# Patient Record
Sex: Female | Born: 1945 | Race: White | Hispanic: No | Marital: Married | Smoking: Former smoker
Health system: Southern US, Community
[De-identification: ages and names within clinical notes are randomized; demographics above are authoritative.]

## PROBLEM LIST (undated history)

## (undated) DIAGNOSIS — E785 Hyperlipidemia, unspecified: Secondary | ICD-10-CM

## (undated) DIAGNOSIS — G8929 Other chronic pain: Secondary | ICD-10-CM

## (undated) DIAGNOSIS — K7460 Unspecified cirrhosis of liver: Secondary | ICD-10-CM

## (undated) DIAGNOSIS — J449 Chronic obstructive pulmonary disease, unspecified: Secondary | ICD-10-CM

## (undated) DIAGNOSIS — K219 Gastro-esophageal reflux disease without esophagitis: Secondary | ICD-10-CM

## (undated) DIAGNOSIS — M1990 Unspecified osteoarthritis, unspecified site: Secondary | ICD-10-CM

## (undated) DIAGNOSIS — H269 Unspecified cataract: Secondary | ICD-10-CM

## (undated) DIAGNOSIS — I10 Essential (primary) hypertension: Secondary | ICD-10-CM

## (undated) DIAGNOSIS — M797 Fibromyalgia: Secondary | ICD-10-CM

## (undated) DIAGNOSIS — D126 Benign neoplasm of colon, unspecified: Secondary | ICD-10-CM

## (undated) DIAGNOSIS — H9190 Unspecified hearing loss, unspecified ear: Secondary | ICD-10-CM

## (undated) DIAGNOSIS — F419 Anxiety disorder, unspecified: Secondary | ICD-10-CM

## (undated) DIAGNOSIS — T7840XA Allergy, unspecified, initial encounter: Secondary | ICD-10-CM

## (undated) DIAGNOSIS — Z1379 Encounter for other screening for genetic and chromosomal anomalies: Secondary | ICD-10-CM

## (undated) DIAGNOSIS — G709 Myoneural disorder, unspecified: Secondary | ICD-10-CM

## (undated) DIAGNOSIS — Z87898 Personal history of other specified conditions: Secondary | ICD-10-CM

## (undated) DIAGNOSIS — E119 Type 2 diabetes mellitus without complications: Secondary | ICD-10-CM

## (undated) DIAGNOSIS — I499 Cardiac arrhythmia, unspecified: Secondary | ICD-10-CM

## (undated) DIAGNOSIS — G4730 Sleep apnea, unspecified: Secondary | ICD-10-CM

## (undated) DIAGNOSIS — M549 Dorsalgia, unspecified: Secondary | ICD-10-CM

## (undated) HISTORY — DX: Chronic obstructive pulmonary disease, unspecified: J44.9

## (undated) HISTORY — DX: Sleep apnea, unspecified: G47.30

## (undated) HISTORY — DX: Other chronic pain: G89.29

## (undated) HISTORY — PX: LARYNX SURGERY: SHX692

## (undated) HISTORY — DX: Unspecified osteoarthritis, unspecified site: M19.90

## (undated) HISTORY — DX: Essential (primary) hypertension: I10

## (undated) HISTORY — DX: Personal history of other specified conditions: Z87.898

## (undated) HISTORY — DX: Hyperlipidemia, unspecified: E78.5

## (undated) HISTORY — DX: Unspecified cirrhosis of liver: K74.60

## (undated) HISTORY — DX: Allergy, unspecified, initial encounter: T78.40XA

## (undated) HISTORY — PX: COLONOSCOPY: SHX174

## (undated) HISTORY — PX: UPPER GASTROINTESTINAL ENDOSCOPY: SHX188

## (undated) HISTORY — DX: Anxiety disorder, unspecified: F41.9

## (undated) HISTORY — DX: Type 2 diabetes mellitus without complications: E11.9

## (undated) HISTORY — PX: TUMOR REMOVAL: SHX12

## (undated) HISTORY — DX: Gastro-esophageal reflux disease without esophagitis: K21.9

## (undated) HISTORY — DX: Unspecified cataract: H26.9

## (undated) HISTORY — DX: Unspecified hearing loss, unspecified ear: H91.90

## (undated) HISTORY — DX: Fibromyalgia: M79.7

## (undated) HISTORY — DX: Cardiac arrhythmia, unspecified: I49.9

## (undated) HISTORY — DX: Dorsalgia, unspecified: M54.9

## (undated) HISTORY — DX: Encounter for other screening for genetic and chromosomal anomalies: Z13.79

## (undated) HISTORY — PX: TUBAL LIGATION: SHX77

## (undated) HISTORY — PX: POLYPECTOMY: SHX149

## (undated) HISTORY — PX: CATARACT EXTRACTION, BILATERAL: SHX1313

## (undated) HISTORY — PX: MULTIPLE TOOTH EXTRACTIONS: SHX2053

## (undated) HISTORY — DX: Benign neoplasm of colon, unspecified: D12.6

## (undated) HISTORY — DX: Myoneural disorder, unspecified: G70.9

---
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
## 1988-08-15 HISTORY — PX: CHOLECYSTECTOMY: SHX55

## 1999-04-14 ENCOUNTER — Other Ambulatory Visit: Admission: RE | Admit: 1999-04-14 | Discharge: 1999-04-14 | Payer: Self-pay | Admitting: Family Medicine

## 2000-04-18 ENCOUNTER — Other Ambulatory Visit: Admission: RE | Admit: 2000-04-18 | Discharge: 2000-04-18 | Payer: Self-pay | Admitting: Family Medicine

## 2001-06-11 ENCOUNTER — Ambulatory Visit (HOSPITAL_BASED_OUTPATIENT_CLINIC_OR_DEPARTMENT_OTHER): Admission: RE | Admit: 2001-06-11 | Discharge: 2001-06-11 | Payer: Self-pay | Admitting: *Deleted

## 2001-06-11 ENCOUNTER — Encounter (INDEPENDENT_AMBULATORY_CARE_PROVIDER_SITE_OTHER): Payer: Self-pay | Admitting: *Deleted

## 2002-05-30 ENCOUNTER — Other Ambulatory Visit: Admission: RE | Admit: 2002-05-30 | Discharge: 2002-05-30 | Payer: Self-pay | Admitting: Family Medicine

## 2003-08-04 ENCOUNTER — Other Ambulatory Visit: Admission: RE | Admit: 2003-08-04 | Discharge: 2003-08-04 | Payer: Self-pay | Admitting: Family Medicine

## 2004-07-19 ENCOUNTER — Encounter: Admission: RE | Admit: 2004-07-19 | Discharge: 2004-09-09 | Payer: Self-pay | Admitting: Podiatry

## 2005-07-12 ENCOUNTER — Other Ambulatory Visit: Admission: RE | Admit: 2005-07-12 | Discharge: 2005-07-12 | Payer: Self-pay | Admitting: Family Medicine

## 2008-08-11 ENCOUNTER — Ambulatory Visit: Payer: Self-pay | Admitting: Cardiology

## 2008-08-11 LAB — CONVERTED CEMR LAB
GFR calc Af Amer: 131 mL/min
GFR calc non Af Amer: 108 mL/min
Potassium: 4.1 meq/L (ref 3.5–5.1)
Sodium: 143 meq/L (ref 135–145)

## 2008-08-14 ENCOUNTER — Encounter: Payer: Self-pay | Admitting: Cardiology

## 2008-08-14 ENCOUNTER — Ambulatory Visit: Payer: Self-pay

## 2008-08-14 ENCOUNTER — Ambulatory Visit: Payer: Self-pay | Admitting: Cardiology

## 2008-08-25 ENCOUNTER — Ambulatory Visit: Payer: Self-pay | Admitting: Cardiology

## 2008-08-25 ENCOUNTER — Ambulatory Visit: Payer: Self-pay

## 2008-08-28 ENCOUNTER — Ambulatory Visit: Payer: Self-pay

## 2008-09-22 ENCOUNTER — Ambulatory Visit: Payer: Self-pay | Admitting: Cardiology

## 2008-10-30 ENCOUNTER — Inpatient Hospital Stay (HOSPITAL_COMMUNITY): Admission: EM | Admit: 2008-10-30 | Discharge: 2008-10-31 | Payer: Self-pay | Admitting: Emergency Medicine

## 2008-10-30 ENCOUNTER — Ambulatory Visit: Payer: Self-pay | Admitting: Cardiology

## 2010-11-25 LAB — CARDIAC PANEL(CRET KIN+CKTOT+MB+TROPI)
CK, MB: 1.1 ng/mL (ref 0.3–4.0)
Relative Index: INVALID (ref 0.0–2.5)
Total CK: 60 U/L (ref 7–177)
Total CK: 69 U/L (ref 7–177)
Troponin I: <0.01 ng/mL (ref 0.00–0.06)

## 2010-11-25 LAB — TSH: TSH: 4.004 u[IU]/mL (ref 0.350–4.500)

## 2010-11-25 LAB — CK TOTAL AND CKMB (NOT AT ARMC)
CK, MB: 1.4 ng/mL (ref 0.3–4.0)
Total CK: 71 U/L (ref 7–177)

## 2010-11-25 LAB — BASIC METABOLIC PANEL
BUN: 11 mg/dL (ref 6–23)
Calcium: 8.6 mg/dL (ref 8.4–10.5)
Chloride: 107 mEq/L (ref 96–112)
GFR calc Af Amer: >60 mL/min (ref >60)
GFR calc Af Amer: >60 mL/min (ref >60)
GFR calc non Af Amer: >60 mL/min (ref >60)
Glucose, Bld: 114 mg/dL — ABNORMAL HIGH (ref 70–99)
Potassium: 3.9 mEq/L (ref 3.5–5.1)
Sodium: 139 mEq/L (ref 135–145)
Sodium: 140 mEq/L (ref 135–145)

## 2010-11-25 LAB — CBC
Hemoglobin: 13.5 g/dL (ref 12.0–15.0)
Hemoglobin: 14.6 g/dL (ref 12.0–15.0)
MCHC: 35.1 g/dL (ref 30.0–36.0)
RBC: 4.18 MIL/uL (ref 3.87–5.11)
RBC: 4.5 MIL/uL (ref 3.87–5.11)
RDW: 13.2 % (ref 11.5–15.5)
WBC: 6.2 10*3/uL (ref 4.0–10.5)

## 2010-11-25 LAB — GLUCOSE, CAPILLARY: Glucose-Capillary: 103 mg/dL — ABNORMAL HIGH (ref 70–99)

## 2010-11-25 LAB — TROPONIN I: Troponin I: <0.01 ng/mL (ref 0.00–0.06)

## 2010-12-28 NOTE — Assessment & Plan Note (Signed)
[facility]                            CARDIOLOGY OFFICE NOTE   Stacey Summers, Stacey Summers                    MRN:          [id number]  DATE:08/11/2008                            DOB:          06-May-1946    PRIMARY CARE PHYSICIAN:  Chipper Herb, MD   REASON FOR PRESENTATION:  Evaluate the patient with palpitations,  shortness of breath, and multiple cardiovascular risk factors.   HISTORY OF PRESENT ILLNESS:  The patient is a 65 year old white female  with a history of, per her report, cardiomegaly that was made years ago.  She said she had an echocardiogram some years ago, but does not recall  any other testing other than perhaps a monitor.  She is referred for  evaluation of predominantly shortness of breath and some discomfort in  her back and chest.  The shortness of breath happens when she walks  incline up to her front door or walks up a flight of stairs.  She does  not describe resting shortness of breath and does not have any PND or  orthopnea.  This has been slowly progressive.  She does have discomfort.  This has been sporadic for quite some time.  It starts in her back.  She  says it is not sharp.  She cannot describe it otherwise.  It is slow and  moderate in intensity.  It radiates through to the front.  It happens at  rest.  It may last for about 30 minutes before going away spontaneously.  If she is doing something, she has to sit and wait for it to go away.  She does not describe discomfort in her jaw or her arms.  She does have  hot flashes not necessarily with this discomfort.  She has diaphoresis,  not necessarily with this discomfort.  She has a low-grade nausea at  times with this discomfort and at other times isolated.  She has never  had any presyncope or syncope.  She will occasionally feel palpitations.  Because of all of these complaints as well as her past history and  family history, she presents for evaluation.   PAST  MEDICAL HISTORY:  Hypertension x30 years, hyperlipidemia x4 years,  questionable cardiomegaly.   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   ALLERGIES AND INTOLERANCES:  None.   MEDICATIONS:  Furosemide 20 mg daily, Symbicort, tramadol 50 mg p.r.n.,  amlodipine, benazepril 5/20 daily, simvastatin 40 mg daily.   SOCIAL HISTORY:  The patient is unemployed.  She is married.  She has 2  children.  She smokes cigarettes only briefly.  She does not drink  alcohol.   FAMILY HISTORY:  Remarkable for her mother apparently dying of  cardiomegaly and congestive heart failure.  She died suddenly at age 39.  She does have a brother with carotid stenosis and a CVA at age 18.   REVIEW OF SYSTEMS:  As stated in the HPI, positive for reflux, hot  flashes, diaphoresis, cough, wheezing, joint pains.  Negative for all  other systems.   PHYSICAL EXAMINATION:  GENERAL:  The patient is in  no distress.  She is  pleasant, but appears a little anxious.  VITAL SIGNS:  Blood pressure 142/84, heart rate 71 and regular, body  mass index 39, weight 210 pounds.  HEENT:  Eyelids are unremarkable, pupils equal, round, and reactive to  light, fundi within normal limits, oral mucosa unremarkable, upper  dentures.  NECK:  No jugular venous distention at 45 degrees.  Carotid upstrokes  brisk and symmetric.  No bruits.  No thyromegaly.  LYMPHATICS:  No cervical, axillary, or inguinal adenopathy.  LUNGS:  Clear to auscultation bilaterally.  BACK:  No costovertebral angle tenderness.  CHEST:  Unremarkable.  HEART:  PMI not displaced or sustained, S1 and S2 within normal limits,  no S3, no S4, no clicks, no rubs, 2/6 apical systolic murmur radiating  slightly at the aortic outlet tracks and into the carotids, no diastolic  murmurs.  ABDOMEN:  Obese, positive bowel sounds, normal in frequency and pitch,  no bruise, no rebound, guarding in the midline, pulsatile mass, no  hepatomegaly,  splenomegaly.  SKIN:  No rashes, no nodules.  EXTREMITIES:  2+ pulses throughout, no edema, no cyanosis, no clubbing.  NEURO:  Oriented to person, place, and time.  Cranial nerves II-XII  grossly intact, motor grossly intact.   EKG:  Sinus rhythm with premature ectopic complex with probably  premature ventricular contractions, no acute ST-T wave changes.   ASSESSMENT AND PLAN:  1. Dyspnea.  The patient has had dyspnea and a history of      cardiomegaly.  She has frequent ventricular ectopy.  I am going to      start with an echocardiogram to evaluate this.  2. Chest discomfort.  I will perform an exercise treadmill test.  I      think the pretext probability of obstructive coronary disease is      somewhat low.  However, obstructive coronary disease needs to be      excluded.  3. Premature ventricular contraction.  She is clearly having a lot of      ectopy.  I will check to see if she has any chemistries recently or      any thyroid studies.  We will also check the echocardiogram as      above.  She will wear a 24-hour Holter monitor.  4. Obesity.  We will discuss weight loss with diet and exercise.  5. Hypertension.  Her blood pressure is slightly elevated.  We will      see what her response on the treadmill test is, and we will make      adjustment as needed.  6. Dyslipidemia per Dr. Laurance Flatten.  7. Followup will be at the time of her echo and stress test.     Minus Breeding, MD, [facility]  Electronically Signed    JH/MedQ  DD: 08/11/2008  DT: 08/12/2008  Job #: [id number]   cc:   Chipper Herb, M.D.

## 2010-12-28 NOTE — Cardiovascular Report (Signed)
NAMEJERIKA, Stacey Summers             ACCOUNT NO.:  [id number]   MEDICAL RECORD NO.:  [id number]           PATIENT TYPE:   LOCATION:                                 FACILITY:   PHYSICIAN:  Shaune Pascal. Bensimhon, MDDATE OF BIRTH:  [birth year]   DATE OF PROCEDURE:  10/31/2008  DATE OF DISCHARGE:                            CARDIAC CATHETERIZATION   PATIENT IDENTIFICATION:  Stacey Summers is a 65 year old woman with a  history of hypertension, hypertension, hyperlipidemia, diabetes,  obesity, and a strong family history of sudden cardiac death.  She has  also a long history of palpitations and previous monitor showed PVCs and  a very brief nonsustained VT.  She also had a previous Myoview which  showed an ejection fraction of 61%.  There was a question of a small  area of ischemia in the mid anterior wall.  This has been treated  medically.  She was admitted with chest pain.  Cardiac markers and EKG  were normal.  Given her risk factors and mildly positive stress test,  she was brought for cardiac catheterization.   PROCEDURES PERFORMED:  1. Selective coronary angiography.  2. Left heart cath.  3. Left ventriculogram.  4. StarClose femoral artery closure.   DESCRIPTION OF PROCEDURE:  The risks and indication were explained.  Consent was signed and placed on the chart.  A 5-French arterial sheath  was placed in the right femoral artery using a modified Seldinger  technique.  Standard catheters including a JL-4, JR-4, and angled  pigtail were used for the procedure.  All catheter exchanges were made  over a wire.  There were no apparent complications.  At the end the  procedure, the right femoral arteriotomy site was closed with a  StarClose device.  There was good hemostasis.   Central aortic pressure 156/74 with a mean of 105.  LV pressure 146/90  with an EDP of 17.  No aortic stenosis.   Left main was short, angiographically normal.   LAD was a long vessel wrapping the apex.  It gave  off 2 diagonals.  LAD  itself was free of significant disease.  In the ostium of the moderate-  sized first diagonal, there was approximately 60% tubular  lesion.  Following this, there was about a 50% lesion in the proximal portion.   Left circumflex was a large codominant vessel.  It gave off a ramus  branch, small OM-1, a posterolateral, and a PDA.  It was  angiographically normal.   Right coronary artery was a small codominant vessel.  It gave off a PDA  coming off the acute marginal branch.  The distal PL was small.  It was  angiographically normal.   Left ventriculogram done in the RAO position showed an EF of 70%.  No  regional wall motion abnormalities.   ASSESSMENT:  1. 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  2. Normal left ventricular function.  3. Frequent premature atrial contractions/premature ventricular      contractions.   PLAN/DISCUSSION:  I suspect her symptoms are more related to her ectopy  rather than coronary artery disease.  We will continue medical therapy.  She will go home tonight.  If symptoms continue, consider outpatient  event monitor.      Shaune Pascal. Bensimhon, MD  Electronically Signed     DRB/MEDQ  D:  10/31/2008  T:  11/01/2008  Job:  [id number]

## 2010-12-28 NOTE — H&P (Signed)
Stacey Summers, Stacey Summers             ACCOUNT NO.:  [id number]   MEDICAL RECORD NO.:  [id number]          PATIENT TYPE:  INP   LOCATION:  3731                         FACILITY:  [facility]   PHYSICIAN:  Denice Bors. Stanford Breed, MD, FACCDATE OF BIRTH:  [birth year]   DATE OF ADMISSION:  10/30/2008  DATE OF DISCHARGE:                              HISTORY & PHYSICAL   Stacey Summers is a 65 year old female with a past medical history of  diet-controlled diabetes mellitus, hypertension, hyperlipidemia, PVCs,  brief nonsustained ventricular tachycardia, who presents with chest pain  and diaphoresis.  The patient has recently been seen by Dr. Percival Spanish  secondary to palpitations and dyspnea.  He initially saw her on August 11, 2008.  At that time, he ordered an echocardiogram.  This was  performed on August 14, 2008, and showed normal LV function and a  trivial aortic insufficiency.  He subsequently scheduled her to have an  exercise treadmill which was performed on August 25, 2008.  She only  exercised for 3 minutes and 21 seconds on the Bruce protocol which was 5  METS.  She achieved a heart rate of 111.  There was fatigue and dyspnea,  but there was no chest pain or electrocardiographic changes.  It was  interpreted as a negative inadequate stress test.  Because she did not  achieve target heart rate, he subsequent ordered an adenosine Myoview  which showed an ejection fraction of 61%.  There was question small area  of ischemia in the mid anterior wall.  He has been treating this  medically.  Note, she did have a Holter monitor as well that showed PVCs  11% of the time with 1 three-beat run of nonsustained ventricular  tachycardia.  There were also occasional PACs and brief runs of PAT.  She was placed on a beta-blocker.  Note, she does have chronic dyspnea  on exertion, but there was no orthopnea or PND.  She occasionally has  mild pedal edema.  She has never had a syncopal episode.  She does not  have exertional chest pain.  Today, she was sitting at her computer  working and suddenly developed a diaphoresis, nausea, and mild  dizziness.  There was no presyncope or syncope.  She had mild chest  tightness.  The tightness did not radiate.  It was not pleuritic or  positional nor was it related to food.  The episode lasted for 15-20  minutes.  It resolved spontaneously.  She subsequently was seen at  [facility] and they referred her to the  emergency room.  She now feels well other than mildly anxious about what  happened earlier today.   Her medications include:  1. Lasix 20 mg p.o. daily.  2. Symbicort.  3. Zocor 40 mg p.o. daily.  4. Lopressor 25 mg p.o. b.i.d.  5. Tramadol.  6. Potassium.  7. Aspirin 81 mg p.o. daily.   She has no known drug allergies.   SOCIAL HISTORY:  She lives in [place], [place].  She is  married and has 3 children.  The patient presently does not smoke.  She  does not consume alcohol.   Her family history is significant for her mother who died suddenly at  age 80.  She states that the autopsy report stated she had an enlarged  heart and also a fatty liver.  She also had a grandmother who died  suddenly in her 63s.  There was also a brother of her grandmother who  died suddenly.   Her past medical history is significant for diet-controlled diabetes  mellitus.  She also has hypertension and hyperlipidemia.  She has had a  previous fatty tumor resected from her abdominal wall.  She also has a  history of asthma.  She has chronic low back pain and degenerative joint  disease.   REVIEW OF SYSTEMS:  There are no headaches or fevers or chills.  She has  not had productive cough or hemoptysis.  There is no dysphagia,  odynophagia, melena, or hematochezia.  There is no dysuria or hematuria.  There is no rash or seizure active.  There is no orthopnea or PND, but  there is pedal edema.  The remaining systems are  negative.   Her physical exam today shows a blood pressure of 132/54 and her pulse  is 72.  Her temperature is 97.7.  She is well developed and somewhat  obese.  She is in no acute distress at present.  Her skin is warm and  dry.  She does not appear to be depressed.  There is no peripheral  clubbing.  Her back is normal.  Her HEENT is normal with normal eyelids.  Her neck is supple with a normal upstroke bilaterally.  No bruits noted.  There is no jugular venous distention, and I cannot appreciate  thyromegaly.  Her chest is clear to auscultation with normal expansion.  Her cardiovascular exam reveals a regular rate and rhythm.  Normal S1  and S2.  There are no murmurs, rubs, or gallops noted.  Abdominal exam  is nontender, nondistended.  Positive bowel sounds.  No  hepatosplenomegaly and no masses appreciated.  There is no abdominal  bruit.  She has 2+ femoral pulses bilaterally.  No bruits.  Extremities  show no edema, and I can palpate no cords.  She has 2+ dorsalis pedis  pulses bilaterally.  Neurologic exam is grossly intact.   Her electrocardiogram shows a normal sinus rhythm with occasional PVC.  There are no ST changes noted.  Her QT is not prolonged.  Chest x-ray  shows no active disease.  Her initial markers are negative.  Her  potassium is 4.5.  Her hemoglobin and hematocrit are 14.6 and 41.5.  Platelet count is 115.   DIAGNOSES:  1. Chest tightness/diaphoresis - the etiology of these events is      unclear to me.  It does not sound like ischemia, but we will cycle      enzymes to exclude myocardial infarction.  We will also watch her      on telemetry to exclude significant arrhythmia.  I will check a D-      dimer as she has traveled to [place] recently.  I think pulmonary      embolus is unlikely, but needs to be excluded.  We will continue      with her aspirin and statin, and I will increase her Lopressor to      50 mg p.o. b.i.d.  I will review with Dr. Percival Spanish.   She does have      a strong family history  of sudden death.  She has a recent question      mild abnormality on her Myoview, and I wonder whether      catheterization may be needed for definitive evaluation.  Again, we      will review this decision with Dr. Percival Spanish.  2. Hypertension - her blood pressure is controlled.  I am increasing      her Lopressor for ectopy.  3. Diabetes mellitus.  4. Hyperlipidemia - she will continue on her statin.      Denice Bors Stanford Breed, MD, [facility]  Electronically Signed     BSC/MEDQ  D:  10/30/2008  T:  10/31/2008  Job:  [id number]

## 2010-12-28 NOTE — Procedures (Signed)
[facility]                              EXERCISE TREADMILL   NAME:[name], Stacey Summers                    MRN:          [id number]  DATE:08/25/2008                            DOB:          17-May-1946    PROCEDURE:  Exercise treadmill test.   INDICATION:  Evaluate the patient with chest pain and cardiovascular  risk factors.   PROCEDURE NOTE:  The patient was exercised using standard Bruce  protocol.  She was only able to exercise for 3 minutes and 21 seconds.  This was 21 seconds into stage II.  She achieved only 5.0 METS.  She had  a peak heart rate of only 111, which was 69% of predicted.  The test was  terminated because of fatigue and dyspnea.  She did not have chest  discomfort.  She complained of arm discomfort with a blood pressure cuff  was squeezing her arm.  She was generally uncomfortable with some  procedure.  There were no ischemic ST-T wave changes.  She had ectopy at  baseline, which improved slightly with activity.  We only had one blood  pressure reading and it did go up to 158/74 peak in stage I.   CONCLUSION:  Negative inadequate stress perfusion study.  The patient  had very poor exercise tolerance.  Of note, I did review her echo with  her.  It demonstrates no significant abnormalities.  She did have a  Holter, which demonstrates premature ventricular contractions about 11%  of the time.  There was one run of three beats of nonsustained  ventricular tachycardia.  There were some premature atrial contractions  and brief runs of atrial tachyarrhythmia as well.   PLAN:  The patient will need to be referred for an adenosine perfusion  study given her poor exercise tolerance.  I still need to exclude  ischemia particular in the light of the ventricular ectopy.  I will see  her back in the clinic after this study is available.     Minus Breeding, MD, [facility]  Electronically Signed    JH/MedQ  DD: 08/25/2008  DT: 08/26/2008  Job #:  [id number]   cc:   Chipper Herb, M.D.

## 2010-12-28 NOTE — Assessment & Plan Note (Signed)
Stacey Summers, Stacey                    MRN:          [id number]  DATE:09/22/2008                            DOB:          February 26, 1946    PRIMARY CARE PHYSICIAN:  Chipper Herb, MD   REASON FOR PRESENTATION:  Evaluate the patient with palpitations,  shortness of breath, and premature ventricular contractions.   HISTORY OF PRESENT ILLNESS:  The patient presented for evaluation of the  above.  I saw her in late December.  Because of these complaints and  cardiovascular risk factors, I had her come back for an exercise  treadmill test.  However, she had very poor exercise tolerance and this  was negative inadequate study.  Therefore, she came back for an  adenosine Cardiolite.  This demonstrated an ejection fraction of 61%.  There was a questionable small area of ischemia in the mid anterior  wall.   The patient now returns for followup.  She states she is not getting any  chest pressure, neck or arm discomfort.  She is having palpitations as  described previously.  These seemed to be about the same pattern.  They  are sporadic.  She notices this is more at night.  She is not having any  presyncope or syncope with this.  She has not had any shortness of  breath or chest pain with this.  They come and go spontaneously.  She  has been under stress.  She was caring for an ailing aunt and her aunt  just died.   PAST MEDICAL HISTORY:  1. 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  2. Short runs of atrial tachycardia.  3. Hypertension x30 years.  4. Hyperlipidemia x4 years.  5. Fatty tumor resected from the abdominal wall.   ALLERGIES/INTOLERANCES:  None.   MEDICATIONS:  1. Furosemide 20 mg daily.  2. Symbicort b.i.d.  3. Amlodipine.  4. Benicar 5/20 daily.  5. Simvastatin 40 mg daily.   REVIEW OF SYSTEMS:  As stated in the HPI and otherwise, negative for  other  systems.   PHYSICAL EXAMINATION:  GENERAL:  The patient is pleasant and in no  distress.  VITAL SIGNS:  Blood pressure 146/86, heart rate 92 and regular, and  weight 213 pounds.  HEENT:  Eyes are unremarkable; pupils equal, round, and reactive to  light; fundi not visualized, oral mucosa unremarkable.  NECK:  No jugular venous distention at 45 degrees; carotid upstroke  brisk and symmetric; no bruits, no thyromegaly.  LYMPHATICS:  No cervical, axillary, or inguinal adenopathy.  LUNGS:  Clear to auscultation bilaterally.  BACK:  No costovertebral angle tenderness.  CHEST:  Unremarkable.  HEART:  PMI not displaced or sustained; S1 and S2 within normal limits;  no S3, no S4; no clicks, no rubs, no murmurs.  ABDOMEN:  Obese; positive bowel sounds; normal in frequency and pitch;  no bruits, no rebound, no guarding; no midline pulsatile mass; no  hepatomegaly, splenomegaly.  SKIN:  No rashes.  No nodules.  EXTREMITIES:  Pulses 2+ throughout; no edema, no cyanosis, no clubbing.  NEUROLOGIC:  Oriented to person, place, and time; cranial nerves II  through XII grossly intact, motor grossly intact.   ASSESSMENT AND PLAN:  1. Palpitations.  The patient does have the ectopy as described.  She      is still feeling these palpitations.  I do not get any strong      evidence of high-grade vascular disease in large vessels.  She has      a well-preserved ejection fraction.  I do not think further workup      is suggested, but would treat this symptomatically.  Therefore, I      am going to start her on metoprolol 25 mg b.i.d.  She will let me      know if these palpitations get worse or not.  2. Abnormal cardiovascular study.  The patient had the mild ischemia      questionably.  However, this is only a mild finding.  I think this      could be managed medically.  No further cardiovascular testing is      suggested.  She should continue with aggressive risk reduction.  3. Hypertension.  Blood  pressure is slightly elevated.  We will treat      this in the context of managing her palpitations with the addition      of the beta-blocker.  4. Dyslipidemia per Dr. Laurance Flatten, with goal LDL less than 100 and HDL      greater than 50.  5. Followup.  I will see the patient again as needed.     Minus Breeding, MD, [facility]  Electronically Signed    JH/MedQ  DD: 09/22/2008  DT: 09/23/2008  Job #: [id number]   cc:   Chipper Herb, M.D.

## 2010-12-28 NOTE — Discharge Summary (Signed)
NAMEJAMESETTA, [facility] Summers             ACCOUNT NO.:  [id number]   MEDICAL RECORD NO.:  [id number]          PATIENT TYPE:  INP   LOCATION:  3731                         FACILITY[facility]  [facility]   PHYSICIAN:  Shaune Pascal. Bensimhon, MDDATE OF BIRTH:  October 04, 1945   DATE OF ADMISSION:  10/30/2008  DATE OF DISCHARGE:  10/31/2008                               DISCHARGE SUMMARY   PRIMARY CARDIOLOGIST:  Stacey Breeding, MD, [facility].   PRIMARY CARE [facility]:  [facility], M.D.   DISCHARGE DIAGNOSIS:  Chest pain.   SECONDARY DIAGNOSES:  1. Frequent premature atrial and ventricular contractions.  2. Nonobstructive coronary artery disease by cardiac catheterization      this admission.  3. History of nonsustained ventricular tachycardia noted on previous      Holter monitor.  4. History of atrial tachycardia.  5. Hypertension x30 years.  6. Hyperlipidemia x4 years.  7. 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  8. Intermittent asthma.  9. Chronic low back pain/degenerative joint disease.   ALLERGIES:  No known drug allergies.   PROCEDURES:  Left heart cardiac catheterization revealing nonobstructive  coronary artery disease with normal left ventricular function.   HISTORY OF PRESENT ILLNESS:  This 65 year old female with the above  problem lists.  She was recently seen by [facility] with complaints of  palpations and dyspnea.  An echocardiogram was performed and showed  normal LV function.  She subsequently underwent adenosine Myoview, x-ray  negative, inadequate treadmill study and the Myoview showed an EF of 61%  with question of ischemia in the mid anterior wall.  Initially, plan was  for medical therapy; however, the patient presented to the [facility]  on October 30, 2008, with complaints of diaphoresis, nausea, and mild  dizziness, as well as mild chest tightness that occurred while she was  sitting on her computer.  Symptoms lasted 15-20 minutes, then resolved  spontaneously.   In the ED, ECG showed no acute changes and enzymes were  negative.  She was admitted for further evaluation.   [facility] COURSE:  The patient is ruled out for MI by cardiac markers.  Decision was made to proceed with left heart cardiac catheterization  which took place this afternoon revealing nonobstructive coronary artery  disease with a normal LV function.   She has had frequent PACs and PVCs noted on telemetry throughout her  admission.  In light of her reasonably normal catheterization, it is  thought that perhaps, her chest discomfort may be related to her ectopy.  We have titrated her metoprolol to 50 mg b.i.d.  [name] has been  ambulating post catheterization without recurrent symptoms and  limitation.  She will be discharged home this evening in good condition.   DISCHARGE LABORATORY DATA:  Potassium 3.9, chloride 107, CO2 of 28, BUN  11, creatinine 0.65, glucose 101, CK 60, MB 1.1, troponin I less than  0.01, TSH 4.004.   DISPOSITION:  The patient is being discharged home today in good  condition.   FOLLOWUP PLANS AND APPOINTMENTS:  We have asked her to contact our  office for followup with Dr.  Hochrein in approximately 2 weeks.  She  will follow up with Dr. Redge Gainer as previously scheduled.   DISCHARGE MEDICATIONS:  1. Lasix 20 mg daily.  2. Tramadol 50 mg daily.  3. Klor-Con 20 mEq daily.  4. Amlodipine/benazepril 5/20 mg daily.  5. Metoprolol 50 mg b.i.d.  6. Simvastatin 40 mg nightly.   OUTSTANDING LAB STUDIES:  None.   DURATION OF DISCHARGE/ENCOUNTER:  Thirty five minutes including  physician time.      [facility], ANP      Fouke Bensimhon, MD  Electronically Signed    CB/MEDQ  D:  10/31/2008  T:  11/01/2008  Job:  [id number]   cc:   [facility], M.D.

## 2010-12-31 NOTE — Op Note (Signed)
[place]. [facility]  Patient:    [facility], [facility] Visit Number: [id number] MRN: [id number]          Service Type: DSU Location: [facility] Attending Physician:  Lucianne Muss Dictated by:   Abbott Pao March Rummage, M.D. Proc. Date: 06/11/01 Admit Date:  06/11/2001   CC:         Clois Dupes, M.D.                           Operative Report  CCS# [id number]  PREOPERATIVE DIAGNOSIS:  Lipoma, left anterior lower chest wall.  POSTOPERATIVE DIAGNOSIS:  Lipoma, left anterior lower chest wall.  OPERATION PERFORMED:  Excision of lipoma left anterior lower chest wall.  SURGEON:  Marcello Moores B. March Rummage, M.D.  ANESTHESIA:  General.  Anesthesiologist and CRNA.  DESCRIPTION OF PROCEDURE:  The patient was taken to the operating room and placed on the table in supine position.  After satisfactory general anesthetic with intubation.  The left anterior chest area was prepped and draped in a sterile field.  The patient had an 8 x 18 cm subcutaneous mass consistent with a lipoma.  A transverse incision 8 cm long was made.  The incision was taken down through the skin and subcutaneous tissues.  The patient had numerous fat lobules that really did shell out like a single lipoma.  With dissection, the 8 x 18 cm lipomatous mass was removed.  Bleeders were suture ligated with 3-0 Vicryl or cauterized with Bovie.  The wound was copiously irrigated and hemostasis obtained.  Because of the large dead space, a #8 Blake drain was placed through a separate stab wound inferior to the transverse incision.  It was sutured to the skin using 3-0 nylon.  Subcutaneous tissues closed with interrupted sutures of 3-0 Vicryl and the skin was closed with a running vertical mattress suture of 4-0 nylon.  Vaseline gauze was placed over the suture line and around the drain site.  Dry sterile dressing was applied.  The patient seemed to tolerate the procedure well and was taken to the PACU  in satisfactory condition. Dictated by:   Abbott Pao March Rummage, M.D. Attending Physician:  Lucianne Muss DD:  06/11/01 TD:  06/12/01 Job: [id number]

## 2012-04-09 ENCOUNTER — Encounter: Payer: Self-pay | Admitting: Gastroenterology

## 2012-05-04 ENCOUNTER — Ambulatory Visit (AMBULATORY_SURGERY_CENTER): Payer: Medicare Other | Admitting: *Deleted

## 2012-05-04 VITALS — Ht 62.0 in | Wt 233.0 lb

## 2012-05-04 DIAGNOSIS — Z1211 Encounter for screening for malignant neoplasm of colon: Secondary | ICD-10-CM

## 2012-05-04 MED ORDER — MOVIPREP 100 G PO SOLR: ORAL | Status: DC

## 2012-05-09 ENCOUNTER — Ambulatory Visit (AMBULATORY_SURGERY_CENTER): Payer: Medicare Other | Admitting: Gastroenterology

## 2012-05-09 ENCOUNTER — Encounter: Payer: Self-pay | Admitting: Gastroenterology

## 2012-05-09 VITALS — BP 123/50 | HR 65 | Temp 97.1°F | Resp 17 | Ht 62.0 in | Wt 233.0 lb

## 2012-05-09 DIAGNOSIS — Z1211 Encounter for screening for malignant neoplasm of colon: Secondary | ICD-10-CM

## 2012-05-09 DIAGNOSIS — K5730 Diverticulosis of large intestine without perforation or abscess without bleeding: Secondary | ICD-10-CM

## 2012-05-09 DIAGNOSIS — D126 Benign neoplasm of colon, unspecified: Secondary | ICD-10-CM

## 2012-05-09 HISTORY — DX: Benign neoplasm of colon, unspecified: D12.6

## 2012-05-09 MED ORDER — SODIUM CHLORIDE 0.9 % IV SOLN: 500.0000 mL | INTRAVENOUS | Status: DC

## 2012-05-09 NOTE — Patient Instructions (Addendum)
Discharge instructions given with verbal understanding. No aspirin or aspirin products for 2 weeks. Follow up in one month. Appointment on October 22, at 9;45AM. Resume previous medications. YOU HAD AN ENDOSCOPIC PROCEDURE TODAY AT [facility]: Refer to the procedure report that was given to you for any specific questions about what was found during the examination.  If the procedure report does not answer your questions, please call your gastroenterologist to clarify.  If you requested that your care partner not be given the details of your procedure findings, then the procedure report has been included in a sealed envelope for you to review at your convenience later.  YOU SHOULD EXPECT: Some feelings of bloating in the abdomen. Passage of more gas than usual.  Walking can help get rid of the air that was put into your GI tract during the procedure and reduce the bloating. If you had a lower endoscopy (such as a colonoscopy or flexible sigmoidoscopy) you may notice spotting of blood in your stool or on the toilet paper. If you underwent a bowel prep for your procedure, then you may not have a normal bowel movement for a few days.  DIET: Your first meal following the procedure should be a light meal and then it is ok to progress to your normal diet.  A half-sandwich or bowl of soup is an example of a good first meal.  Heavy or fried foods are harder to digest and may make you feel nauseous or bloated.  Likewise meals heavy in dairy and vegetables can cause extra gas to form and this can also increase the bloating.  Drink plenty of fluids but you should avoid alcoholic beverages for 24 hours.  ACTIVITY: Your care partner should take you home directly after the procedure.  You should plan to take it easy, moving slowly for the rest of the day.  You can resume normal activity the day after the procedure however you should NOT DRIVE or use heavy machinery for 24 hours (because of the sedation  medicines used during the test).    SYMPTOMS TO REPORT IMMEDIATELY: A gastroenterologist can be reached at any hour.  During normal business hours, 8:30 AM to 5:00 PM Monday through Friday, call [phone].  After hours and on weekends, please call the GI answering service at [phone] who will take a message and have the physician on call contact you.   Following lower endoscopy (colonoscopy or flexible sigmoidoscopy):  Excessive amounts of blood in the stool  Significant tenderness or worsening of abdominal pains  Swelling of the abdomen that is new, acute  Fever of 100F or higher  FOLLOW UP: If any biopsies were taken you will be contacted by phone or by letter within the next 1-3 weeks.  Call your gastroenterologist if you have not heard about the biopsies in 3 weeks.  Our staff will call the home number listed on your records the next business day following your procedure to check on you and address any questions or concerns that you may have at that time regarding the information given to you following your procedure. This is a courtesy call and so if there is no answer at the home number and we have not heard from you through the emergency physician on call, we will assume that you have returned to your regular daily activities without incident.  SIGNATURES/CONFIDENTIALITY: You and/or your care partner have signed paperwork which will be entered into your electronic medical record.  These signatures  attest to the fact that that the information above on your After Visit Summary has been reviewed and is understood.  Full responsibility of the confidentiality of this discharge information lies with you and/or your care-partner.

## 2012-05-09 NOTE — Op Note (Signed)
[place]  [address]. [place], [id number]   COLONOSCOPY PROCEDURE REPORT  PATIENT: [facility], [facility]  MR#: [id number] BIRTHDATE: 04/10/1946 , 66  yrs. old GENDER: Female ENDOSCOPIST: Sable Feil, MD, Marval Regal REFERRED BY:  Redge Gainer, M.D. PROCEDURE DATE:  05/09/2012 PROCEDURE:   Colonoscopy with snare polypectomy ASA CLASS:   Class II INDICATIONS:average risk patient for colon cancer and heme-positive stool. MEDICATIONS: Propofol (Diprivan) 240 mg IV  DESCRIPTION OF PROCEDURE:   After the risks and benefits and of the procedure were explained, informed consent was obtained.  A digital rectal exam revealed no abnormalities of the rectum.    The LB [id number]  endoscope was introduced through the anus and advanced to the cecum, which was identified by both the appendix and ileocecal valve .  The quality of the prep was good, using MoviPrep .  The instrument was then slowly withdrawn as the colon was fully examined.     COLON FINDINGS: An innumerable number of polypoid shaped and firm flat polyps ranging between 5-95m in size were found throughout the entire examined colon and in the left and right colon.  A polypectomy was performed using snare cautery.  The resection was complete and the polyp tissue was partially retrieved.   Abnormal mucosa was found.   Severe diverticulosis was noted in the descending colon and sigmoid colon. Lage # flat right colon polyps piecemeal removed,greater than 10.  Right colon polyps are flat,firm and over 2 cm. size, jar #1. Also greater than 10 left colon polyps hot snare removed from the left colon,,,jar #2. Retroflexed views revealed no abnormalities.     The scope was then withdrawn from the patient and the procedure completed.  COMPLICATIONS: There were no complications. ENDOSCOPIC IMPRESSION: 1.   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  2.   Severe diverticulosis was noted in the descending colon and sigmoid colon  RECOMMENDATIONS: 1.  await biopsy results 2.   genetic referral 3.  F/u 3 mos if polyps are not cancerous. 4.  Hold aspirin, aspirin products, and anti-inflammatory medication for 2 weeks.   REPEAT EXAM:  cc:  _______________________________ eSigned: Sable FeilMD, FAMem[facility]9/25/2013 9:37 AM     PATIENT NAME:  [name], KysarR#: [id number]

## 2012-05-09 NOTE — Progress Notes (Signed)
Patient did not experience any of the following events: a burn prior to discharge; a fall within the facility; wrong site/side/patient/procedure/implant event; or a hospital transfer or hospital admission upon discharge from the facility. (G8907) Patient did not have preoperative order for IV antibiotic SSI prophylaxis. (G8918)  

## 2012-05-10 ENCOUNTER — Telehealth: Payer: Self-pay | Admitting: *Deleted

## 2012-05-10 NOTE — Telephone Encounter (Signed)
  Follow up Call-  Call back number 05/09/2012  Post procedure Call Back phone  # cell [phone]  Permission to leave phone message Yes     Patient questions:  Do you have a fever, pain , or abdominal swelling? no Pain Score  0 *  Have you tolerated food without any problems? yes  Have you been able to return to your normal activities? yes  Do you have any questions about your discharge instructions: Diet   no Medications  no Follow up visit  no  Do you have questions or concerns about your Care? no  Actions: * If pain score is 4 or above: No action needed, pain <4. Pt. Stated she was worried about what doctor found yesterday during procedure. Encouraged pt. To follow up with doctor in office on the 22nd.

## 2012-05-15 ENCOUNTER — Telehealth: Payer: Self-pay | Admitting: Gastroenterology

## 2012-05-15 DIAGNOSIS — D369 Benign neoplasm, unspecified site: Secondary | ICD-10-CM

## 2012-05-15 NOTE — Telephone Encounter (Signed)
Informed pt that someone from the [facility] will be calling her, but it will be for a Genetic Referral. Her path is back and she has serrated adenomas as well as adenomas; they are pre cancerous, but are not cancer. She stated understanding. Pt also reports her lower abdomen is still very tender and she has back pain; she has tenderness when she gets up and down from sitting. She reports she has fibromyalgia and back pain, so it may be that; she just thought she'd report it. She is having stools; sometimes they are normal, sometimes loose, but no blood. She is eating OK. Spoke with Dr Hilarie Fredrickson about pt's s&s. He asks that she call back Thursday or Friday with an update. Dr Sharlett Iles took out a lot of polyps and she could still be tender from that. She is able to eat and drink and is having BMs so he thinks she's OK. The fact she isn't bleeding is a good sign as well. Informed pt who stated understanding and will update [place] later in the week.

## 2012-05-21 ENCOUNTER — Encounter: Payer: Self-pay | Admitting: Internal Medicine

## 2012-05-22 ENCOUNTER — Telehealth: Payer: Self-pay | Admitting: Genetic Counselor

## 2012-05-22 ENCOUNTER — Telehealth: Payer: Self-pay | Admitting: *Deleted

## 2012-05-22 NOTE — Telephone Encounter (Signed)
S/W in re genetic counselor appt 11/18 @ 10 w/Karen Florene Glen.  NP packet mailed.

## 2012-05-22 NOTE — Telephone Encounter (Signed)
[facility], please place referral to genetics for evaluation for hereditary colon polyp syndrome.  Also schedule office followup with Dr. Sharlett Iles in 4-6 weeks Patient has scheduled OV on 06/05/12 and referral has been made.

## 2012-06-01 ENCOUNTER — Encounter: Payer: Self-pay | Admitting: *Deleted

## 2012-06-05 ENCOUNTER — Encounter: Payer: Self-pay | Admitting: Gastroenterology

## 2012-06-05 ENCOUNTER — Ambulatory Visit (INDEPENDENT_AMBULATORY_CARE_PROVIDER_SITE_OTHER): Payer: Medicare Other | Admitting: Gastroenterology

## 2012-06-05 VITALS — BP 118/62 | HR 60 | Ht 61.25 in | Wt 231.0 lb

## 2012-06-05 DIAGNOSIS — Z800 Family history of malignant neoplasm of digestive organs: Secondary | ICD-10-CM

## 2012-06-05 DIAGNOSIS — K5730 Diverticulosis of large intestine without perforation or abscess without bleeding: Secondary | ICD-10-CM

## 2012-06-05 DIAGNOSIS — Z86010 Personal history of colonic polyps: Secondary | ICD-10-CM

## 2012-06-05 NOTE — Progress Notes (Signed)
History of Present Illness: This is a very complicated 66 year old Caucasian female he recently underwent screening colonoscopy because of a family history of colon cancer. This was performed on September 25, and the patient had over 20 polyps removed at the time of her colonoscopy with large flat polyps in the left and right colon. Pathologic review showed serrated adenomas. She comes for followup exam consultation.  This patient has had chronic gas, bloating, and constipation for many years. Recent colonoscopy also showed rather marked left colon diverticulosis. Review of her labs shows no evidence of anemia or other metabolic disturbances. She is on medications for anxiety, hypertension, and hyperlipidemia. Her family history is remarkable for colon cancer in her father in his 42s, he also had prostate cancer, and she has a brother and uncle also with colon cancer in their 6s. Genetic evaluation is pending. Her primary care physician is Dr. Redge Gainer in [place]. The patient's granddaughter apparently has some type of skin abnormalities and has seen a gastroenterologist in [place]. With the patient today were husband, daughter, and granddaughter. They had multiple detail questions concerning colon polyps, colon cancer, and the need for colonoscopy screening and family members. These were addressed at length. I did not agree to examine the granddaughter today.    Current Medications, Allergies, Past Medical History, Past Surgical History, Family History and Social History were reviewed in [employer] record.   Assessment and plan: This patient has symptomatic diverticulosis, and I placed her on high fiber diet with daily Metamucil and liberal by mouth fluids. We have scheduled followup colonoscopy for March of 2014. She has appointment in November to see our genetic advise her, and I have strongly advised the family to pay for this assay despite whatever cost may arise. This  would be important to them for advisement as to followup for her children and grandchildren, and the patient's need for more detailed cancer evaluation including GYN yearly evaluations, and perhaps endoscopic exam. 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 I reviewed her labs today, she does not have significant anemia, and have not ordered other labs for review. Please copy this note to genetic counselor at the [facility] No diagnosis found.

## 2012-06-05 NOTE — Patient Instructions (Addendum)
You will be due for a recall colonoscopy in 10/2012. We will send you a reminder in the mail when it gets closer to that time. Please keep your appointment to see the Geneticist in November. Please begin taking Metamucil (may purchase over the counter) and take as directed on the bottle. Please follow a high fiber diet.  CC: Dr Redge Gainer  High-Fiber Diet Fiber is found in fruits, vegetables, and grains. A high-fiber diet encourages the addition of more whole grains, legumes, fruits, and vegetables in your diet. The recommended amount of fiber for adult males is 38 g per day. For adult females, it is 25 g per day. Pregnant and lactating women should get 28 g of fiber per day. If you have a digestive or bowel problem, ask your caregiver for advice before adding high-fiber foods to your diet. Eat a variety of high-fiber foods instead of only a select few type of foods.  PURPOSE  To increase stool bulk.  To make bowel movements more regular to prevent constipation.  To lower cholesterol.  To prevent overeating. WHEN IS THIS DIET USED?  It may be used if you have constipation and hemorrhoids.  It may be used if you have uncomplicated diverticulosis (intestine condition) and irritable bowel syndrome.  It may be used if you need help with weight management.  It may be used if you want to add it to your diet as a protective measure against atherosclerosis, diabetes, and cancer. SOURCES OF FIBER  Whole-grain breads and cereals.  Fruits, such as apples, oranges, bananas, berries, prunes, and pears.  Vegetables, such as green peas, carrots, sweet potatoes, beets, broccoli, cabbage, spinach, and artichokes.  Legumes, such split peas, soy, lentils.  Almonds. FIBER CONTENT IN FOODS Starches and Grains / Dietary Fiber (g)  Cheerios, 1 cup / 3 g  Corn Flakes cereal, 1 cup / 0.7 g  Rice crispy treat cereal, 1 cup / 0.3 g  Instant oatmeal (cooked),  cup / 2 g  Frosted wheat cereal,  1 cup / 5.1 g  Brown, long-grain rice (cooked), 1 cup / 3.5 g  White, long-grain rice (cooked), 1 cup / 0.6 g  Enriched macaroni (cooked), 1 cup / 2.5 g Legumes / Dietary Fiber (g)  Baked beans (canned, plain, or vegetarian),  cup / 5.2 g  Kidney beans (canned),  cup / 6.8 g  Pinto beans (cooked),  cup / 5.5 g Breads and Crackers / Dietary Fiber (g)  Plain or honey graham crackers, 2 squares / 0.7 g  Saltine crackers, 3 squares / 0.3 g  Plain, salted pretzels, 10 pieces / 1.8 g  Whole-wheat bread, 1 slice / 1.9 g  White bread, 1 slice / 0.7 g  Raisin bread, 1 slice / 1.2 g  Plain bagel, 3 oz / 2 g  Flour tortilla, 1 oz / 0.9 g  Corn tortilla, 1 small / 1.5 g  Hamburger or hotdog bun, 1 small / 0.9 g Fruits / Dietary Fiber (g)  Apple with skin, 1 medium / 4.4 g  Sweetened applesauce,  cup / 1.5 g  Banana,  medium / 1.5 g  Grapes, 10 grapes / 0.4 g  Orange, 1 small / 2.3 g  Raisin, 1.5 oz / 1.6 g  Melon, 1 cup / 1.4 g Vegetables / Dietary Fiber (g)  Green beans (canned),  cup / 1.3 g  Carrots (cooked),  cup / 2.3 g  Broccoli (cooked),  cup / 2.8 g  Peas (cooked),  cup /  4.4 g  Mashed potatoes,  cup / 1.6 g  Lettuce, 1 cup / 0.5 g  Corn (canned),  cup / 1.6 g  Tomato,  cup / 1.1 g Document Released: 08/01/2005 Document Revised: 01/31/2012 Document Reviewed: 11/03/2011 [employer] Patient Information 2013 Exit[employer], [place]. __________________________________________________________________________________  Colon Polyps Polyps are lumps of extra tissue growing inside the body. Polyps can grow in the large intestine (colon). Most colon polyps are noncancerous (benign). However, some colon polyps can become cancerous over time. Polyps that are larger than a pea may be harmful. To be safe, caregivers remove and test all polyps. CAUSES  Polyps form when mutations in the genes cause your cells to grow and divide even though no more tissue is  needed. RISK FACTORS There are a number of risk factors that can increase your chances of getting colon polyps. They include:  Being older than 50 years.  Family history of colon polyps or colon cancer.  Long-term colon diseases, such as colitis or Crohn disease.  Being overweight.  Smoking.  Being inactive.  Drinking too much alcohol. SYMPTOMS  Most small polyps do not cause symptoms. If symptoms are present, they may include:  Blood in the stool. The stool may look dark red or black.  Constipation or diarrhea that lasts longer than 1 week. DIAGNOSIS People often do not know they have polyps until their caregiver finds them during a regular checkup. Your caregiver can use 4 tests to check for polyps:  Digital rectal exam. The caregiver wears gloves and feels inside the rectum. This test would find polyps only in the rectum.  Barium enema. The caregiver puts a liquid called barium into your rectum before taking X-rays of your colon. Barium makes your colon look white. Polyps are dark, so they are easy to see in the X-ray pictures.  Sigmoidoscopy. A thin, flexible tube (sigmoidoscope) is placed into your rectum. The sigmoidoscope has a light and tiny camera in it. The caregiver uses the sigmoidoscope to look at the last third of your colon.  Colonoscopy. This test is like sigmoidoscopy, but the caregiver looks at the entire colon. This is the most common method for finding and removing polyps. TREATMENT  Any polyps will be removed during a sigmoidoscopy or colonoscopy. The polyps are then tested for cancer. PREVENTION  To help lower your risk of getting more colon polyps:  Eat plenty of fruits and vegetables. Avoid eating fatty foods.  Do not smoke.  Avoid drinking alcohol.  Exercise every day.  Lose weight if recommended by your caregiver.  Eat plenty of calcium and folate. Foods that are rich in calcium include milk, cheese, and broccoli. Foods that are rich in folate  include chickpeas, kidney beans, and spinach. HOME CARE INSTRUCTIONS Keep all follow-up appointments as directed by your caregiver. You may need periodic exams to check for polyps. SEEK MEDICAL CARE IF: You notice bleeding during a bowel movement. Document Released: 04/27/2004 Document Revised: 10/24/2011 Document Reviewed: 10/11/2011 [facility] Patient Information 2013 [place].

## 2012-07-02 ENCOUNTER — Ambulatory Visit: Payer: Medicare Other | Admitting: Lab

## 2012-07-02 ENCOUNTER — Ambulatory Visit (HOSPITAL_BASED_OUTPATIENT_CLINIC_OR_DEPARTMENT_OTHER): Payer: Medicare Other | Admitting: Genetic Counselor

## 2012-07-02 ENCOUNTER — Encounter: Payer: Self-pay | Admitting: Genetic Counselor

## 2012-07-02 DIAGNOSIS — K635 Polyp of colon: Secondary | ICD-10-CM

## 2012-07-02 DIAGNOSIS — D126 Benign neoplasm of colon, unspecified: Secondary | ICD-10-CM

## 2012-07-02 DIAGNOSIS — IMO0002 Reserved for concepts with insufficient information to code with codable children: Secondary | ICD-10-CM

## 2012-07-02 DIAGNOSIS — Z800 Family history of malignant neoplasm of digestive organs: Secondary | ICD-10-CM

## 2012-07-02 NOTE — Progress Notes (Signed)
Dr.  Verl Blalock requested a consultation for genetic counseling and risk assessment for Stacey Summers, a 66 y.o. female, for discussion of her personal history of colon polyps. She presents to clinic today to discuss the possibility of a genetic predisposition to cancer, and to further clarify her risks, as well as her family members' risks for cancer.   HISTORY OF PRESENT ILLNESS: Stacey Summers is a 66 y.o. female with no personal history of cancer.  In 2013, at the age of 23, Stacey Summers was diagnosed with over 30 colon polyps on her first colonoscopy.  Approximately 20 were biopsied.   Past Medical History  Diagnosis Date  . Asthma     bronchitis  . Allergy     seasonal  . Arthritis   . GERD (gastroesophageal reflux disease)   . Hyperlipidemia   . Hypertension   . Tubular adenoma of colon 05/09/12    "innumerable number" of flat polyps  . DJD (degenerative joint disease)   . Anxiety     Past Surgical History  Procedure Date  . Tonsillectomy 1965  . Cholecystectomy 1990  . Cesarean section 1977  . Tumor removal     benign fatty tumors on abdomen  . Polypectomy     vocal cord    History  Substance Use Topics  . Smoking status: Former Smoker    Types: Cigarettes    Quit date: 08/15/1969  . Smokeless tobacco: Never Used  . Alcohol Use: No    REPRODUCTIVE HISTORY AND PERSONAL RISK ASSESSMENT FACTORS: Menarche was at age 81.   Menopause in her 23s Uterus Intact: Yes Ovaries Intact: Yes G3P3A0 , first live birth at age 52  She has not previously undergone treatment for infertility.   Never used OCPs   She has not used HRT in the past.    FAMILY HISTORY:  We obtained a detailed, 4-generation family history.  Significant diagnoses are listed below: Family History  Problem Relation Age of Onset  . Prostate cancer Father     dx in his late 69s  . Colon cancer Father     dx in his 43s  . Heart failure Mother   . Other Brother     colon  resection  . Colon polyps Brother     multiple large polyps  . Spina bifida Brother     died at birth  . Other Paternal Uncle     colostomy  . Colon cancer Paternal Uncle     in his 20s  . Heart failure Maternal Grandmother   . Heart failure Maternal Uncle   . Stomach cancer Paternal Aunt   . Brain cancer Cousin 19  . Colon cancer Other     fathers cousin  . Kidney cancer Cousin     maternal cousin  . Other Other 16    multiple lipomas  The patient had her first colonoscopy in 2013, which found multiple flat polyps, as well as tubular adenomas.  Her brother had a partial colectomy because of colon polyps.  Her father had prostate cancer in his late 50s and colon cancer in his 39s.  He had four sisters and a brother.  One sister died of stomach cancer at an unknown age, and his brother has had rectal bleeding in his 39s but does not want a colonoscopy.  One sister married a distant cousin, who also has colon cancer.  Patient's maternal ancestors are of unknown descent, and paternal ancestors are of unknown  descent. There is no reported Ashkenazi Jewish ancestry. There is no  known consanguinity.  GENETIC COUNSELING RISK ASSESSMENT, DISCUSSION, AND SUGGESTED FOLLOW UP: We reviewed the natural history and genetic etiology of sporadic, familial and hereditary cancer syndromes.  About 10% of colon cancer is hereditary.  About 3% is the result of Lynch syndrome, which is most indiciative of the cancers in the patient's family and the flat polyps.  However, because of the family history of multiple colon polyps, as well as the lipomas, we will order a colon cancer panel.  The patient's personal history of colon polyps and family history of colon and stomach cancer is suggestive of the following possible diagnosis: hereditary cancer syndrome  We discussed that identification of a hereditary cancer syndrome may help her care providers tailor the patients medical management. If a mutation  indicating a hereditary cancer syndrome is detected in this case, the [employer]  recommendations would include increased cancer survillance and possible prophylactic surgery. If a mutation is detected, the patient will be referred back to the referring provider and to any additional appropriate care providers to discuss the relevant options.   If a mutation is not found in the patient, this will decrease the likelihood of a hereditary cancer syndrome as the explanation for her colon polyps. Cancer surveillance options would be discussed for the patient according to the appropriate standard Walker guidelines, with consideration of their personal and family history risk factors. In this case, the patient will be referred back to their care providers for discussions of management.   After considering the risks, benefits, and limitations, the patient provided informed consent for  the following  testing: Colon Cancer Panel through [facility].   Per the patient's request, we will contact her by telephone to discuss these results. A follow up genetic counseling visit will be scheduled if indicated.  The patient was seen for a total of 60 minutes, greater than 50% of which was spent face-to-face counseling.  This plan is being carried out per Dr. Winnifred Friar recommendations.  This note will also be sent to the referring provider via the electronic medical record. The patient will be supplied with a summary of this genetic counseling discussion as well as educational information on the discussed hereditary cancer syndromes following the conclusion of their visit.   Patient was discussed with Dr. Marcy Panning.  Patient was seen for X minutes.    _______________________________________________________________________ For Office Staff:  Number of people involved in session: 5 Was an Intern/ student involved with case:  yes

## 2012-07-24 ENCOUNTER — Telehealth: Payer: Self-pay | Admitting: Gastroenterology

## 2012-07-24 NOTE — Telephone Encounter (Signed)
Pt is worried about waiting until March, 2014 for her next COLON. Explained to pt Dr Sharlett Iles has reviewed her chart and thinks it's OK to wait. We are also waiting on a reports from the [employer]. Pt also reports she has not heard back from the counselor. Called Roma Kayser at the [facility] who stated pt's lab report takes about 6 weeks to complete. Santiago Glad will try to call the pt today. Informed pt of the length of time for lab results and that Santiago Glad will call her; pt stated understanding.

## 2012-08-06 ENCOUNTER — Telehealth: Payer: Self-pay | Admitting: Genetic Counselor

## 2012-08-06 NOTE — Telephone Encounter (Signed)
Revealed negative Colon Cancer panel testing.  Recommend that she be followed based on FH and PH of polyps, and her family members need to be followed more closely.

## 2012-08-16 ENCOUNTER — Encounter: Payer: Self-pay | Admitting: Genetic Counselor

## 2012-09-10 ENCOUNTER — Encounter: Payer: Self-pay | Admitting: Gastroenterology

## 2012-09-10 ENCOUNTER — Telehealth: Payer: Self-pay | Admitting: Gastroenterology

## 2012-09-10 NOTE — Telephone Encounter (Signed)
Pt is concerned about waiting til March for her repeat COLON. She has seen the Retail buyer and what she states is not found in the notes from the referral; removing female organs since she is post menopausal, Ok to have EGD. She reports she has had polyps removed from her vocal before and she get choked on occasion. Since Dr Sharlett Iles is off the 1st 2 weeks of March, 2014, her procedure is scheduled for 10/09/12. Pt stated understanding.

## 2012-10-02 ENCOUNTER — Ambulatory Visit (AMBULATORY_SURGERY_CENTER): Payer: Medicare Other

## 2012-10-02 ENCOUNTER — Encounter: Payer: Self-pay | Admitting: Gastroenterology

## 2012-10-02 VITALS — Ht 62.0 in | Wt 236.4 lb

## 2012-10-02 DIAGNOSIS — Z800 Family history of malignant neoplasm of digestive organs: Secondary | ICD-10-CM

## 2012-10-02 DIAGNOSIS — Z1211 Encounter for screening for malignant neoplasm of colon: Secondary | ICD-10-CM

## 2012-10-02 DIAGNOSIS — Z86010 Personal history of colonic polyps: Secondary | ICD-10-CM

## 2012-10-02 DIAGNOSIS — R1319 Other dysphagia: Secondary | ICD-10-CM

## 2012-10-02 DIAGNOSIS — Z8371 Family history of colonic polyps: Secondary | ICD-10-CM

## 2012-10-02 DIAGNOSIS — R05 Cough: Secondary | ICD-10-CM

## 2012-10-02 MED ORDER — MOVIPREP 100 G PO SOLR: ORAL | Status: DC

## 2012-10-10 ENCOUNTER — Encounter: Payer: Self-pay | Admitting: Gastroenterology

## 2012-10-10 ENCOUNTER — Ambulatory Visit (AMBULATORY_SURGERY_CENTER): Payer: Medicare Other | Admitting: Gastroenterology

## 2012-10-10 VITALS — BP 135/65 | HR 65 | Temp 97.5°F | Resp 20 | Ht 62.0 in | Wt 236.0 lb

## 2012-10-10 DIAGNOSIS — Z800 Family history of malignant neoplasm of digestive organs: Secondary | ICD-10-CM

## 2012-10-10 DIAGNOSIS — K222 Esophageal obstruction: Secondary | ICD-10-CM

## 2012-10-10 DIAGNOSIS — K219 Gastro-esophageal reflux disease without esophagitis: Secondary | ICD-10-CM

## 2012-10-10 DIAGNOSIS — K5730 Diverticulosis of large intestine without perforation or abscess without bleeding: Secondary | ICD-10-CM

## 2012-10-10 DIAGNOSIS — D128 Benign neoplasm of rectum: Secondary | ICD-10-CM

## 2012-10-10 DIAGNOSIS — Z860100 Personal history of colon polyps, unspecified: Secondary | ICD-10-CM

## 2012-10-10 DIAGNOSIS — R1319 Other dysphagia: Secondary | ICD-10-CM

## 2012-10-10 DIAGNOSIS — D126 Benign neoplasm of colon, unspecified: Secondary | ICD-10-CM

## 2012-10-10 DIAGNOSIS — R05 Cough: Secondary | ICD-10-CM

## 2012-10-10 DIAGNOSIS — K2990 Gastroduodenitis, unspecified, without bleeding: Secondary | ICD-10-CM

## 2012-10-10 DIAGNOSIS — R059 Cough, unspecified: Secondary | ICD-10-CM

## 2012-10-10 DIAGNOSIS — D129 Benign neoplasm of anus and anal canal: Secondary | ICD-10-CM

## 2012-10-10 DIAGNOSIS — Z1211 Encounter for screening for malignant neoplasm of colon: Secondary | ICD-10-CM

## 2012-10-10 MED ORDER — SODIUM CHLORIDE 0.9 % IV SOLN: 500.0000 mL | INTRAVENOUS | Status: DC

## 2012-10-10 MED ORDER — ESOMEPRAZOLE MAGNESIUM 40 MG PO CPDR: 40.0000 mg | DELAYED_RELEASE_CAPSULE | Freq: Every day | ORAL | Status: DC

## 2012-10-10 NOTE — Op Note (Signed)
[place]  [address] [place], [id number]   COLONOSCOPY PROCEDURE REPORT  PATIENT: Stacey Summers, Stacey Summers  MR#: [id number] BIRTHDATE: [birth year] , 66  yrs. old GENDER: Female ENDOSCOPIST: Sable Feil, MD, [facility] REFERRED BY: PROCEDURE DATE:  10/10/2012 PROCEDURE:   Colonoscopy with snare polypectomy and Colonoscopy with hot biopsy/bipolar ASA CLASS:   Class II INDICATIONS:Patient's personal history of adenomatous colon polyps.  MEDICATIONS: propofol (Diprivan) 482m IV  DESCRIPTION OF PROCEDURE:   After the risks and benefits and of the procedure were explained, informed consent was obtained.  A digital rectal exam revealed no abnormalities of the rectum.    The LB CF-[id number] endoscope was introduced through the anus and advanced to the cecum, which was identified by both the appendix and ileocecal valve .  The quality of the prep was good, using MoviPrep .  The instrument was then slowly withdrawn as the colon was fully examined.     COLON FINDINGS: Three smooth and polypoid shaped sessile polyps ranging between 3-725min size were found at the hepatic flexure.  A polypectomy was performed using snare cautery and with cold forceps.  The resection was complete and the polyp tissue was completely retrieved.   A small smooth flat polyp was found in the sigmoid colon.   Moderate diverticulosis was noted in the descending colon and sigmoid colon.  Removed with cold biopsy. Retroflexed views revealed no abnormalities. Hypertrophied perianal papillae noted.    The scope was then withdrawn from the patient and the procedure completed.  COMPLICATIONS: There were no complications. ENDOSCOPIC IMPRESSION: 1.   Three sessile polyps ranging between 3-31m71mn size were found at the hepatic flexure; polypectomy was performed using snare cautery and with cold forceps 2.   Small flat polyp was found in the sigmoid colon   3. Mild left colon  diverticulosis       3.   Moderate diverticulosis was noted in the descending colon and sigmoid colon  RECOMMENDATIONS: 1.  Repeat Colonoscopy in 1 year. 2.  Await pathology results 3.  High fiber diet  REPEAT EXAM:  cc:    Megan Morris,DO  _______________________________ eSigned:  DavSable FeilD, FACUrosurgical Center Of Richmond North/26/2014 10:36 AM     PATIENT NAME:  Stacey Summers, Stacey Summers#: [id number]

## 2012-10-10 NOTE — Progress Notes (Signed)
Called to room to assist during endoscopic procedure.  Patient ID and intended procedure confirmed with present staff. Received instructions for my participation in the procedure from the performing physician.  

## 2012-10-10 NOTE — Patient Instructions (Addendum)
Handouts were given to your care partner on polyps, diverticulosis, high fiber diet, Esophageal stricture and dilatation diet, esophagitis and GERD reflux.  A new prescription was sent to [facility] at [facility], [place] for nexium 40 mg to take one each am 20 - 30 minutes before breakfast daily.  You may resume your other current medication today.  Please call if any questions or concerns.    YOU HAD AN ENDOSCOPIC PROCEDURE TODAY AT [facility]: Refer to the procedure report that was given to you for any specific questions about what was found during the examination.  If the procedure report does not answer your questions, please call your gastroenterologist to clarify.  If you requested that your care partner not be given the details of your procedure findings, then the procedure report has been included in a sealed envelope for you to review at your convenience later.  YOU SHOULD EXPECT: Some feelings of bloating in the abdomen. Passage of more gas than usual.  Walking can help get rid of the air that was put into your GI tract during the procedure and reduce the bloating. If you had a lower endoscopy (such as a colonoscopy or flexible sigmoidoscopy) you may notice spotting of blood in your stool or on the toilet paper. If you underwent a bowel prep for your procedure, then you may not have a normal bowel movement for a few days.  DIET:   Drink plenty of fluids but you should avoid alcoholic beverages for 24 hours.  Please follow the dilatation diet the rest of the day.  ACTIVITY: Your care partner should take you home directly after the procedure.  You should plan to take it easy, moving slowly for the rest of the day.  You can resume normal activity the day after the procedure however you should NOT DRIVE or use heavy machinery for 24 hours (because of the sedation medicines used during the test).    SYMPTOMS TO REPORT IMMEDIATELY: A gastroenterologist can be reached at any hour.  During  normal business hours, 8:30 AM to 5:00 PM Monday through Friday, call [phone].  After hours and on weekends, please call the GI answering service at [phone] who will take a message and have the physician on call contact you.   Following lower endoscopy (colonoscopy or flexible sigmoidoscopy):  Excessive amounts of blood in the stool  Significant tenderness or worsening of abdominal pains  Swelling of the abdomen that is new, acute  Fever of 100F or higher  Following upper endoscopy (EGD)  Vomiting of blood or coffee ground material  New chest pain or pain under the shoulder blades  Painful or persistently difficult swallowing  New shortness of breath  Fever of 100F or higher  Black, tarry-looking stools  FOLLOW UP: If any biopsies were taken you will be contacted by phone or by letter within the next 1-3 weeks.  Call your gastroenterologist if you have not heard about the biopsies in 3 weeks.  Our staff will call the home number listed on your records the next business day following your procedure to check on you and address any questions or concerns that you may have at that time regarding the information given to you following your procedure. This is a courtesy call and so if there is no answer at the home number and we have not heard from you through the emergency physician on call, we will assume that you have returned to your regular daily activities without incident.  SIGNATURES/CONFIDENTIALITY: You and/or your care partner have signed paperwork which will be entered into your electronic medical record.  These signatures attest to the fact that that the information above on your After Visit Summary has been reviewed and is understood.  Full responsibility of the confidentiality of this discharge information lies with you and/or your care-partner.

## 2012-10-10 NOTE — Op Note (Signed)
[place]  [address]. [place], [address]   ENDOSCOPY PROCEDURE REPORT  PATIENT: Stacey Summers, Stacey Summers  MR#: [id number] BIRTHDATE: 1945-11-10 , 66  yrs. old GENDER: Female ENDOSCOPIST:David Consuello Masse, MD, [facility] REFERRED BY: PROCEDURE DATE:  10/10/2012 PROCEDURE:   EGD w/ biopsy for H.pylori and Maloney dilation of esophagus ASA CLASS:    Class II INDICATIONS: Dysphagia. MEDICATION: There was residual sedation effect present from prior procedure and propofol (Diprivan) 149m IV TOPICAL ANESTHETIC:  DESCRIPTION OF PROCEDURE:   After the risks and benefits of the procedure were explained, informed consent was obtained.  The LB GIF-H180 [id number] endoscope was introduced through the mouth  and advanced to the second portion of the duodenum .  The instrument was slowly withdrawn as the mucosa was fully examined.      DUODENUM: The duodenal mucosa showed no abnormalities.  STOMACH: There was mild antral gastropathy noted.  Cold forcep biopsies were taken at the antrum.  ESOPHAGUS: There was LA Class B esophagitis noted.   A stricture was found at the gastroesophageal junction.  Dilated #66F Maloney dilator...no heme or pain....  Retroflexed views revealed no abnormalities.    The scope was then withdrawn from the patient and the procedure completed.  COMPLICATIONS: There were no complications.   ENDOSCOPIC IMPRESSION: 1.   The duodenal mucosa showed no abnormalities 2.   There was mild antral gastropathy noted [T2] ..Marland KitchenMarland Kitchen/O H.pylori 3.   There was LA Class B esophagitis noted ..Marland KitchenMarland KitchenERD 4.   Stricture was found at the gastroesophageal junction ...dilated   RECOMMENDATIONS: 1.  Await pathology results 2.  Continue PPI 3.  Dilatations PRN 4.  Rx CLO if positive    _______________________________ eSigned:  DSable Feil MD, FFrem[facility] Center02/26/2014 10:42 AM  cc Dr. MLinda Hedges   PATIENT NAME:  [name], TherienMR#: [id number]

## 2012-10-10 NOTE — Progress Notes (Signed)
No complaints noted in the recovery room. Maw  Patient did not experience any of the following events: a burn prior to discharge; a fall within the facility; wrong site/side/patient/procedure/implant event; or a hospital transfer or hospital admission upon discharge from the facility. (G8907) Patient did not have preoperative order for IV antibiotic SSI prophylaxis. (G8918)  

## 2012-10-11 ENCOUNTER — Telehealth: Payer: Self-pay | Admitting: *Deleted

## 2012-10-11 LAB — HELICOBACTER PYLORI SCREEN-BIOPSY: UREASE: NEGATIVE

## 2012-10-11 NOTE — Telephone Encounter (Signed)
  Follow up Call-  Call back number 10/10/2012 05/09/2012  Post procedure Call Back phone  # [phone] cell [phone]  Permission to leave phone message Yes Yes     Patient questions:  Do you have a fever, pain , or abdominal swelling? no Pain Score  0 *  Have you tolerated food without any problems? yes  Have you been able to return to your normal activities? yes  Do you have any questions about your discharge instructions: Diet   no Medications  no Follow up visit  no  Do you have questions or concerns about your Care? no  Actions: * If pain score is 4 or above: No action needed, pain <4.  Patient stating she thinks she is warm but her husband stating she is not. Instructed if she checks her temperature and is over 100-100.5 to call us back.

## 2012-10-12 ENCOUNTER — Telehealth: Payer: Self-pay | Admitting: Gastroenterology

## 2012-10-12 MED ORDER — ESOMEPRAZOLE MAGNESIUM 40 MG PO CPDR: 40.0000 mg | DELAYED_RELEASE_CAPSULE | Freq: Every day | ORAL | Status: DC

## 2012-10-12 NOTE — Telephone Encounter (Signed)
Prescription fixed  Patient notified

## 2012-10-20 ENCOUNTER — Encounter: Payer: Self-pay | Admitting: Gastroenterology

## 2012-10-23 ENCOUNTER — Telehealth: Payer: Self-pay | Admitting: Gastroenterology

## 2012-10-23 NOTE — Telephone Encounter (Signed)
Informed pt of path results and routed results to Stacey Summers whom she will see tomorrow. Pt stated understanding.

## 2012-12-18 ENCOUNTER — Other Ambulatory Visit: Payer: Self-pay | Admitting: *Deleted

## 2012-12-18 MED ORDER — METOPROLOL SUCCINATE ER 50 MG PO TB24: 50.0000 mg | ORAL_TABLET | Freq: Every day | ORAL | Status: DC

## 2012-12-18 MED ORDER — BENAZEPRIL HCL 20 MG PO TABS: 20.0000 mg | ORAL_TABLET | Freq: Every day | ORAL | Status: DC

## 2012-12-18 MED ORDER — AMLODIPINE BESYLATE 5 MG PO TABS: 5.0000 mg | ORAL_TABLET | Freq: Every day | ORAL | Status: DC

## 2012-12-18 MED ORDER — FUROSEMIDE 40 MG PO TABS: 40.0000 mg | ORAL_TABLET | Freq: Every day | ORAL | Status: DC

## 2012-12-18 MED ORDER — PRAVASTATIN SODIUM 40 MG PO TABS: 40.0000 mg | ORAL_TABLET | Freq: Every day | ORAL | Status: DC

## 2012-12-18 NOTE — Telephone Encounter (Signed)
Rxs up front ready to pick up. Pt notified

## 2012-12-18 NOTE — Telephone Encounter (Signed)
PLEASE PRINT FOR MAIL ORDER. CALL PT TO PICKUP. LAST OV 12/13. LAST LABS 12/13

## 2012-12-18 NOTE — Telephone Encounter (Signed)
PAtient NTBS RX ready for pick up

## 2012-12-20 ENCOUNTER — Other Ambulatory Visit: Payer: Self-pay | Admitting: Nurse Practitioner

## 2012-12-20 ENCOUNTER — Other Ambulatory Visit: Payer: Self-pay | Admitting: *Deleted

## 2012-12-20 ENCOUNTER — Ambulatory Visit (INDEPENDENT_AMBULATORY_CARE_PROVIDER_SITE_OTHER): Payer: Medicare Other | Admitting: Nurse Practitioner

## 2012-12-20 ENCOUNTER — Encounter: Payer: Self-pay | Admitting: Nurse Practitioner

## 2012-12-20 VITALS — BP 143/83 | HR 69 | Temp 97.5°F | Ht 62.25 in | Wt 231.0 lb

## 2012-12-20 DIAGNOSIS — F411 Generalized anxiety disorder: Secondary | ICD-10-CM

## 2012-12-20 DIAGNOSIS — R350 Frequency of micturition: Secondary | ICD-10-CM

## 2012-12-20 DIAGNOSIS — M545 Low back pain, unspecified: Secondary | ICD-10-CM

## 2012-12-20 DIAGNOSIS — I10 Essential (primary) hypertension: Secondary | ICD-10-CM

## 2012-12-20 DIAGNOSIS — R7309 Other abnormal glucose: Secondary | ICD-10-CM

## 2012-12-20 DIAGNOSIS — M797 Fibromyalgia: Secondary | ICD-10-CM | POA: Insufficient documentation

## 2012-12-20 DIAGNOSIS — IMO0001 Reserved for inherently not codable concepts without codable children: Secondary | ICD-10-CM

## 2012-12-20 LAB — POCT URINALYSIS DIPSTICK
Blood, UA: NEGATIVE
Glucose, UA: NEGATIVE
Nitrite, UA: NEGATIVE
Urobilinogen, UA: NEGATIVE
pH, UA: 5

## 2012-12-20 LAB — COMPLETE METABOLIC PANEL WITH GFR
AST: 43 U/L — ABNORMAL HIGH (ref 0–37)
Albumin: 4.1 g/dL (ref 3.5–5.2)
Alkaline Phosphatase: 86 U/L (ref 39–117)
Potassium: 4.4 mEq/L (ref 3.5–5.3)
Sodium: 142 mEq/L (ref 135–145)
Total Protein: 6.6 g/dL (ref 6.0–8.3)

## 2012-12-20 LAB — POCT UA - MICROSCOPIC ONLY
Casts, Ur, LPF, POC: NEGATIVE
Crystals, Ur, HPF, POC: NEGATIVE

## 2012-12-20 MED ORDER — CYCLOBENZAPRINE HCL 5 MG PO TABS: 5.0000 mg | ORAL_TABLET | Freq: Three times a day (TID) | ORAL | Status: DC | PRN

## 2012-12-20 MED ORDER — PRAVASTATIN SODIUM 40 MG PO TABS: 40.0000 mg | ORAL_TABLET | Freq: Every day | ORAL | Status: DC

## 2012-12-20 NOTE — Patient Instructions (Signed)
Back Exercises Back exercises help treat and prevent back injuries. The goal of back exercises is to increase the strength of your abdominal and back muscles and the flexibility of your back. These exercises should be started when you no longer have back pain. Back exercises include:  Pelvic Tilt. Lie on your back with your knees bent. Tilt your pelvis until the lower part of your back is against the floor. Hold this position 5 to 10 sec and repeat 5 to 10 times.  Knee to Chest. Pull first 1 knee up against your chest and hold for 20 to 30 seconds, repeat this with the other knee, and then both knees. This may be done with the other leg straight or bent, whichever feels better.  Sit-Ups or Curl-Ups. Bend your knees 90 degrees. Start with tilting your pelvis, and do a partial, slow sit-up, lifting your trunk only 30 to 45 degrees off the floor. Take at least 2 to 3 seconds for each sit-up. Do not do sit-ups with your knees out straight. If partial sit-ups are difficult, simply do the above but with only tightening your abdominal muscles and holding it as directed.  Hip-Lift. Lie on your back with your knees flexed 90 degrees. Push down with your feet and shoulders as you raise your hips a couple inches off the floor; hold for 10 seconds, repeat 5 to 10 times.  Back arches. Lie on your stomach, propping yourself up on bent elbows. Slowly press on your hands, causing an arch in your low back. Repeat 3 to 5 times. Any initial stiffness and discomfort should lessen with repetition over time.  Shoulder-Lifts. Lie face down with arms beside your body. Keep hips and torso pressed to floor as you slowly lift your head and shoulders off the floor. Do not overdo your exercises, especially in the beginning. Exercises may cause you some mild back discomfort which lasts for a few minutes; however, if the pain is more severe, or lasts for more than 15 minutes, do not continue exercises until you see your caregiver.  Improvement with exercise therapy for back problems is slow.  See your caregivers for assistance with developing a proper back exercise program. Document Released: 09/08/2004 Document Revised: 10/24/2011 Document Reviewed: 06/02/2011 [facility] Patient Information 2013 [name].

## 2012-12-20 NOTE — Progress Notes (Addendum)
  Subjective:    Patient ID: Stacey Summers, female    DOB: 21-Apr-1946, 67 y.o.   MRN: [id number]  HPI - Patient in C/O back pain and urinary frequency and urgency. But patient is on lasix daily. Back pain is a chronic problem. Says that it feels like it has been "catching" a lot lately. Not sure if it is coming from fibromyalgia or something different.    Review of Systems  Musculoskeletal: Positive for back pain. Negative for gait problem.  Neurological: Negative for dizziness, numbness and headaches.       Objective:   Physical Exam  Constitutional: She is oriented to person, place, and time. She appears well-developed and well-nourished.  Cardiovascular: Normal rate, normal heart sounds and intact distal pulses.   Pulmonary/Chest: Effort normal and breath sounds normal.  Musculoskeletal:  FROM of lumbar spine with pain on rotation and flexion. No point  Tenderness today. (-) SLR bil. Motor strength and sensation bil lower ext. Intact.  Neurological: She is alert and oriented to person, place, and time. She has normal reflexes.    BP 143/83  Pulse 69  Temp(Src) 97.5 F (36.4 C) (Oral)  Ht 5' 2.25" (1.581 m)  Wt 231 lb (104.781 kg)  BMI 41.92 kg/m2 Results for orders placed in visit on 12/20/12  POCT URINALYSIS DIPSTICK      Result Value Range   Color, UA straw     Clarity, UA clear     Glucose, UA neg     Bilirubin, UA neg     Ketones, UA neg     Spec Grav, UA 1.020     Blood, UA neg     pH, UA 5.0     Protein, UA neg     Urobilinogen, UA negative     Nitrite, UA neg     Leukocytes, UA Negative    POCT UA - MICROSCOPIC ONLY      Result Value Range   WBC, Ur, HPF, POC 1-2     RBC, urine, microscopic 0-1     Bacteria, U Microscopic occ     Mucus, UA trace     Epithelial cells, urine per micros occ     Crystals, Ur, HPF, POC neg     Casts, Ur, LPF, POC neg     Yeast, UA neg           Assessment & Plan:  1. Frequent urination Force fluids - POCT  urinalysis dipstick - POCT UA - Microscopic Only  2. Low back pain Moist heat  Rest Back strengthing stretches - POCT urinalysis dipstick - POCT UA - Microscopic Only - cyclobenzaprine (FLEXERIL) 5 MG tablet; Take 1 tablet (5 mg total) by mouth 3 (three) times daily as needed for muscle spasms.  Dispense: 30 tablet; Refill: 1  3. Hypertension Low Na diet and exercise  4. GAD (generalized anxiety disorder) Stress management  5. Fibromyalgia muscle pain Continue ultram as needed.  Labs pending Mary-Margaret Hassell Done, FNP

## 2012-12-20 NOTE — Addendum Note (Signed)
Addended by: Chevis Pretty on: 12/20/2012 10:40 AM   Modules accepted: Orders

## 2012-12-24 ENCOUNTER — Telehealth: Payer: Self-pay | Admitting: *Deleted

## 2012-12-24 LAB — NMR LIPOPROFILE WITH LIPIDS
Cholesterol, Total: 162 mg/dL (ref <200)
Large HDL-P: 7.7 umol/L (ref >=4.8)
Large VLDL-P: 1.4 nmol/L (ref <=2.7)
Triglycerides: 83 mg/dL (ref <150)
VLDL Size: 50.2 nm — ABNORMAL HIGH (ref <=46.6)

## 2012-12-24 MED ORDER — ATORVASTATIN CALCIUM 40 MG PO TABS: 40.0000 mg | ORAL_TABLET | Freq: Every day | ORAL | Status: DC

## 2012-12-24 NOTE — Telephone Encounter (Signed)
Mmm:  pt is willing to try a new chol med- she would rather do lipitor than crestor b/c of generic. Will need new rx for mail order.   Lab: please add this A1C on to this blood.

## 2012-12-24 NOTE — Telephone Encounter (Signed)
lipitor Rx ready for pick up

## 2012-12-24 NOTE — Telephone Encounter (Signed)
Message copied by Zannie Cove on Mon Dec 24, 2012  3:17 PM ------      Message from: Chevis Pretty      Created: Mon Dec 24, 2012  2:15 PM       Blood sugar elevated- Please add HBGA1C.      LDL particle # elevated as well as LDL- Need to change pravachol- HAs patient tried any other statins? ------

## 2012-12-25 ENCOUNTER — Other Ambulatory Visit: Payer: Self-pay | Admitting: *Deleted

## 2012-12-25 MED ORDER — PRAVASTATIN SODIUM 40 MG PO TABS: 40.0000 mg | ORAL_TABLET | Freq: Every day | ORAL | Status: DC

## 2012-12-25 NOTE — Telephone Encounter (Signed)
Patient aware.

## 2012-12-25 NOTE — Telephone Encounter (Signed)
Patient last seen in office on 12-20-12 for an acute visit. Last chronic med follow up was 08-06-12. Labs were also done on 08-06-12. Please advise. Requesting 90 mail order rx. If approved please print and have pt pick up

## 2012-12-26 LAB — POCT GLYCOSYLATED HEMOGLOBIN (HGB A1C): Hemoglobin A1C: 6

## 2012-12-26 NOTE — Addendum Note (Signed)
Addended by: Lyn Henri C on: 12/26/2012 10:10 AM   Modules accepted: Orders

## 2013-01-02 ENCOUNTER — Telehealth: Payer: Self-pay | Admitting: Nurse Practitioner

## 2013-01-02 NOTE — Telephone Encounter (Signed)
Has had normal mammogram at last time so she plans to get it done at the [facility] this time.  Transferred call to front for schedule.

## 2013-01-15 ENCOUNTER — Ambulatory Visit: Payer: Medicare Other | Admitting: Cardiovascular Disease

## 2013-01-21 ENCOUNTER — Ambulatory Visit: Payer: Self-pay | Admitting: Nurse Practitioner

## 2013-01-28 ENCOUNTER — Ambulatory Visit: Payer: Medicare Other | Admitting: Cardiovascular Disease

## 2013-01-31 ENCOUNTER — Ambulatory Visit: Payer: Medicare Other | Admitting: Cardiovascular Disease

## 2013-02-18 ENCOUNTER — Telehealth: Payer: Self-pay | Admitting: Gastroenterology

## 2013-02-18 NOTE — Telephone Encounter (Signed)
lmom for pt to call back

## 2013-02-18 NOTE — Telephone Encounter (Signed)
Ok as tolerated

## 2013-02-18 NOTE — Telephone Encounter (Signed)
Pt had ECL on 10/10/12 and had a 90 day supply of Nexium. She states she asked about changing to a cheaper PPI and you wanted her to use the 90 days of Nexium 40 mg. Pt wants to know if she can change to the OTC Nexium 20 mg because it's cheaper; OK to switch? Thanks

## 2013-02-18 NOTE — Telephone Encounter (Signed)
Informed pt she may try to decrease Nexium to 20 mg/day, but if she gets into trouble double the dose and call us. She was concerned about her Recall Colon, but informed her the Recall is in the system for her to be notified next February; pt stated understanding.

## 2013-02-20 ENCOUNTER — Telehealth: Payer: Self-pay | Admitting: Nurse Practitioner

## 2013-02-22 ENCOUNTER — Telehealth: Payer: Self-pay | Admitting: Nurse Practitioner

## 2013-02-22 MED ORDER — GLUCOSE BLOOD VI STRP: ORAL_STRIP | Status: DC

## 2013-02-22 MED ORDER — LIBERTY BLOOD GLUCOSE MONITOR DEVI: 1.0000 | Freq: Every day | Status: DC

## 2013-02-22 NOTE — Telephone Encounter (Signed)
rx sent to pharmacy

## 2013-02-22 NOTE — Telephone Encounter (Signed)
LMTCB 02/22/13

## 2013-02-23 ENCOUNTER — Telehealth: Payer: Self-pay | Admitting: *Deleted

## 2013-02-23 MED ORDER — ONETOUCH DELICA LANCETS 33G MISC: 1.0000 | Freq: Every day | Status: DC

## 2013-02-23 NOTE — Telephone Encounter (Signed)
Needs rx for lancets

## 2013-02-23 NOTE — Telephone Encounter (Signed)
Left message that rx was sent to pharmacy

## 2013-02-25 ENCOUNTER — Encounter: Payer: Self-pay | Admitting: Cardiovascular Disease

## 2013-02-25 ENCOUNTER — Ambulatory Visit (INDEPENDENT_AMBULATORY_CARE_PROVIDER_SITE_OTHER): Payer: Medicare Other | Admitting: Cardiovascular Disease

## 2013-02-25 VITALS — BP 90/70 | HR 55 | Resp 14 | Ht 62.0 in | Wt 234.9 lb

## 2013-02-25 DIAGNOSIS — R011 Cardiac murmur, unspecified: Secondary | ICD-10-CM

## 2013-02-25 DIAGNOSIS — I10 Essential (primary) hypertension: Secondary | ICD-10-CM

## 2013-02-25 DIAGNOSIS — R600 Localized edema: Secondary | ICD-10-CM

## 2013-02-25 DIAGNOSIS — R609 Edema, unspecified: Secondary | ICD-10-CM

## 2013-02-25 DIAGNOSIS — R5383 Other fatigue: Secondary | ICD-10-CM | POA: Insufficient documentation

## 2013-02-25 DIAGNOSIS — E785 Hyperlipidemia, unspecified: Secondary | ICD-10-CM | POA: Insufficient documentation

## 2013-02-25 DIAGNOSIS — R5381 Other malaise: Secondary | ICD-10-CM

## 2013-02-25 DIAGNOSIS — E119 Type 2 diabetes mellitus without complications: Secondary | ICD-10-CM

## 2013-02-25 DIAGNOSIS — Z8489 Family history of other specified conditions: Secondary | ICD-10-CM | POA: Insufficient documentation

## 2013-02-25 NOTE — Assessment & Plan Note (Signed)
Sustained efforts at calorie restriction, exercise and weight loss are strongly recommended.

## 2013-02-25 NOTE — Progress Notes (Signed)
Patient ID: Stacey Summers, female   DOB: 1946-03-04, 67 y.o.   MRN: [id number]    Reason for office visit Fatigue, edema, hypertension  The patient is a 67 year old woman who is morbidly obese. Her husband [name] is also patient of practice. She has multiple coronary risk factors that include hypertension diabetes mellitus and hyperlipidemia all of which are being treated with pharmacological means. She complains of feeling tired all the time and becoming easily exhausted. She denies any problems with chest pain although she sometimes has a "strange feeling" in the middle of her chest". She denies any dyspnea at rest but has functional class II dyspnea on exertion. She denies palpitations recently but in the past was prescribed a beta blocker for palpitations, which she still takes. The beta blocker was prescribed, her other antihypertensive medications were not reduced. She has problems with ankle edema that is sometimes severe. Although it is always better early in the morning, it never resolves completely. In 2009 she underwent an echocardiogram and also a pharmacological nuclear stress test. Do not have the report of the latter study.    No Known Allergies  Current Outpatient Prescriptions  Medication Sig Dispense Refill  . ALPRAZolam (XANAX) 0.25 MG tablet Take 0.25 mg by mouth 2 (two) times daily as needed.      Marland Kitchen amLODipine (NORVASC) 5 MG tablet Take 1 tablet (5 mg total) by mouth daily.  90 tablet  0  . aspirin 81 MG tablet Take 81 mg by mouth as needed for pain.      Marland Kitchen atorvastatin (LIPITOR) 40 MG tablet Take 1 tablet (40 mg total) by mouth daily.  90 tablet  1  . benazepril (LOTENSIN) 20 MG tablet Take 1 tablet (20 mg total) by mouth daily.  90 tablet  0  . Blood Glucose Monitoring Suppl (LIBERTY BLOOD GLUCOSE MONITOR) DEVI 1 each by Does not apply route daily. Test 1x per day and prn-dx250.02  1 each  0  . Cholecalciferol (VITAMIN D3) 2000 UNITS TABS Take 1 tablet by mouth daily.       . cyclobenzaprine (FLEXERIL) 5 MG tablet Take 1 tablet (5 mg total) by mouth 3 (three) times daily as needed for muscle spasms.  30 tablet  1  . esomeprazole (NEXIUM) 40 MG capsule Take 1 capsule (40 mg total) by mouth daily before breakfast.  90 capsule  6  . furosemide (LASIX) 40 MG tablet Take 1 tablet (40 mg total) by mouth daily.  90 tablet  0  . glucose blood test strip Test 1x per day and prn- dx 250.02  100 each  12  . metoprolol succinate (TOPROL-XL) 50 MG 24 hr tablet Take 1 tablet (50 mg total) by mouth daily.  90 tablet  0  . naproxen sodium (ANAPROX) 220 MG tablet Take 220 mg by mouth as needed.      Glory Rosebush DELICA LANCETS 28M MISC 1 each by Does not apply route daily. Test 1X per day and prn-- dx 250.02  100 each  11  . traMADol (ULTRAM) 50 MG tablet Take 1 tablet by mouth as needed. pain      . cholecalciferol (VITAMIN D) 1000 UNITS tablet Take 1,000 Units by mouth 2 (two) times daily.       No current facility-administered medications for this visit.    Past Medical History  Diagnosis Date  . Asthma     bronchitis  . Allergy     seasonal  . Arthritis   .  GERD (gastroesophageal reflux disease)   . Hyperlipidemia   . Hypertension   . Tubular adenoma of colon 05/09/12    "innumerable number" of flat polyps  . DJD (degenerative joint disease)   . Anxiety     Past Surgical History  Procedure Laterality Date  . Tonsillectomy  1965  . Cholecystectomy  1990  . Cesarean section  1977  . Tumor removal      benign fatty tumors on abdomen  . Polypectomy      vocal cord    Family History  Problem Relation Age of Onset  . Prostate cancer Father     dx in his late 15s  . Colon cancer Father     dx in his 28s  . Heart failure Mother   . Heart disease Mother   . Other Brother     colon resection  . Colon polyps Brother     multiple large polyps  . Spina bifida Brother     died at birth  . Other Paternal Uncle     colostomy  . Colon cancer Paternal Uncle      in his 48s  . Heart failure Maternal Grandmother   . Heart failure Maternal Uncle   . Stomach cancer Paternal Aunt   . Brain cancer Cousin 19  . Colon cancer Other     fathers cousin  . Kidney cancer Cousin     maternal cousin  . Other Other 16    multiple lipomas    History   Social History  . Marital Status: Married    Spouse Name: N/A    Number of Children: 3  . Years of Education: N/A   Occupational History  . Not on file.   Social History Main Topics  . Smoking status: Former Smoker    Types: Cigarettes    Quit date: 08/15/1969  . Smokeless tobacco: Never Used  . Alcohol Use: No  . Drug Use: No  . Sexually Active: Not on file   Other Topics Concern  . Not on file   Social History Narrative  . No narrative on file    Review of systems: The patient specifically denies any chest pain at rest or with exertion, dyspnea at rest, orthopnea, paroxysmal nocturnal dyspnea, syncope, palpitations, focal neurological deficits, intermittent claudication,  unexplained weight gain, cough, hemoptysis or wheezing.  The patient also denies abdominal pain, nausea, vomiting, dysphagia, diarrhea, constipation, polyuria, polydipsia, dysuria, hematuria, frequency, urgency, abnormal bleeding or bruising, fever, chills, unexpected weight changes, mood swings, change in skin or hair texture, change in voice quality, auditory or visual problems, allergic reactions or rashes, new musculoskeletal complaints other than usual "aches and pains".  She denies excessive daytime sleepiness, inappropriate napping, falling asleep during activities her conversation. She thinks she snores heavily.   PHYSICAL EXAM BP 90/70  Pulse 55  Resp 14  Ht 5' 2"  (1.575 m)  Wt 234 lb 14.4 oz (106.55 kg)  BMI 42.95 kg/m2  General: Alert, oriented x3, no distress; morbidly obese Head: no evidence of trauma, PERRL, EOMI, no exophtalmos or lid lag, no myxedema, no xanthelasma; normal ears, nose and  oropharynx Neck: normal jugular venous pulsations and no hepatojugular reflux; brisk carotid pulses without delay and no carotid bruits Chest: clear to auscultation, no signs of consolidation by percussion or palpation, normal fremitus, symmetrical and full respiratory excursions Cardiovascular: normal position and quality of the apical impulse, regular rhythm, normal first and second heart sounds, grade 2/6 systolic murmur heard best  in the aortic focus but with a holosystolic rather than Crescenta decrescendo pattern, rubs or gallops Abdomen: no tenderness or distention, no masses by palpation, no abnormal pulsatility or arterial bruits, normal bowel sounds, no hepatosplenomegaly Extremities: no clubbing, cyanosis or edema; 2+ radial, ulnar and brachial pulses bilaterally; 2+ right femoral, posterior tibial and dorsalis pedis pulses; 2+ left femoral, posterior tibial and dorsalis pedis pulses; no subclavian or femoral bruits Neurological: grossly nonfocal   EKG: Sinus bradycardia otherwise normal  Echo from 2009 (performed for palpitations) was essentially normal. No valvular abnormalities were seen that could explain the murmur. Lipid Panel      Component Value Date/Time   TRIG 83 12/20/2012 1044   LDLCALC 103* 12/20/2012 1044    BMET    Component Value Date/Time   NA 142 12/20/2012 1044   K 4.4 12/20/2012 1044   CL 104 12/20/2012 1044   CO2 30 12/20/2012 1044   GLUCOSE 134* 12/20/2012 1044   BUN 13 12/20/2012 1044   CREATININE 0.67 12/20/2012 1044   CREATININE 0.65 10/31/2008 0513   CALCIUM 9.5 12/20/2012 1044   GFRNONAA >60 10/31/2008 0513   GFRAA  Value: >60        The eGFR has been calculated using the MDRD equation. This calculation has not been validated in all clinical situations. eGFR's persistently <60 mL/min signify possible Chronic Kidney Disease. 10/31/2008 0513     ASSESSMENT AND PLAN Hypertension Her hypertension appears to be excessively treated and this may explain her fatigue. I  have asked her to decrease her dose of amlodipine to only 2.5 mg daily. We will reevaluate her in about a month and may decide to discontinue amlodipine altogether. If the metoprolol should be continued for its fringe benefit in treatment of arrhythmia, similarly the benazepril is beneficial to prevent diabetic nephropathy.  Fatigue This may be hypotension related. Her obesity definitely places her at risk for obstructive sleep apnea but she does not score too high on the Epworth scale of sleepiness. If her fatigue persists despite normalization of blood pressure,  consider sleep study.  Bilateral leg edema Hopefully this will improve with reduction in dose of amlodipine.  Murmur, cardiac As far as I can tell this was not present at the time of her echocardiogram in 2009. It is hard to discriminate whether it represents aortic valve sclerosis or tricuspid insufficiency. I think it would be useful to evaluate her tricuspid valve and right heart since she has significant lower showed ede I have recommended a repeat echo..  Family history of sudden death 3 close family members have died suddenly at young ages. She does not have evidence of structural heart disease by previous echocardiogram and does not have evidence of long QT syndrome, Brugada syndrome or other electrocardiographic anomalies associated with increased risk of arrhythmic death. Reportedly she has had a normal myocardial perfusion study. At this point in time, I do not think further workup is necessary.  Morbid obesity Sustained efforts at calorie restriction, exercise and weight loss are strongly recommended.  DM2 (diabetes mellitus, type 2) Well-controlled) as far as I can tell) without secondary complications  Orders Placed This Encounter  Procedures  . 2D Echocardiogram with contrast   Meds ordered this encounter  Medications  . Cholecalciferol (VITAMIN D3) 2000 UNITS TABS    Sig: Take 1 tablet by mouth daily.     Holli Humbles, MD, [place] [facility] [place] [phone] office [phone] pager

## 2013-02-25 NOTE — Assessment & Plan Note (Signed)
Well-controlled) as far as I can tell) without secondary complications

## 2013-02-25 NOTE — Assessment & Plan Note (Signed)
3 close family members have died suddenly at young ages. She does not have evidence of structural heart disease by previous echocardiogram and does not have evidence of long QT syndrome, Brugada syndrome or other electrocardiographic anomalies associated with increased risk of arrhythmic death. Reportedly she has had a normal myocardial perfusion study. At this point in time, I do not think further workup is necessary.

## 2013-02-25 NOTE — Assessment & Plan Note (Signed)
Her hypertension appears to be excessively treated and this may explain her fatigue. I have asked her to decrease her dose of amlodipine to only 2.5 mg daily. We will reevaluate her in about a month and may decide to discontinue amlodipine altogether. If the metoprolol should be continued for its fringe benefit in treatment of arrhythmia, similarly the benazepril is beneficial to prevent diabetic nephropathy.

## 2013-02-25 NOTE — Patient Instructions (Addendum)
Your physician recommends that you schedule a follow-up appointment in: 4 weeks Your physician has requested that you have an echocardiogram. Echocardiography is a painless test that uses sound waves to create images of your heart. It provides your doctor with information about the size and shape of your heart and how well your heart's chambers and valves are working. This procedure takes approximately one hour. There are no restrictions for this procedure. Your physician has recommended you make the following change in your medication: Reduce amlodipine to 2.5 mg daily (half a tablet daily).

## 2013-02-25 NOTE — Assessment & Plan Note (Signed)
Acceptable parameters on current treatment regimen

## 2013-02-25 NOTE — Assessment & Plan Note (Signed)
As far as I can tell this was not present at the time of her echocardiogram in 2009. It is hard to discriminate whether it represents aortic valve sclerosis or tricuspid insufficiency. I think it would be useful to evaluate her tricuspid valve and right heart since she has significant lower showed ede I have recommended a repeat echo.Marland Kitchen

## 2013-02-25 NOTE — Assessment & Plan Note (Signed)
Hopefully this will improve with reduction in dose of amlodipine.

## 2013-02-25 NOTE — Assessment & Plan Note (Signed)
This may be hypotension related. Her obesity definitely places her at risk for obstructive sleep apnea but she does not score too high on the Epworth scale of sleepiness. If her fatigue persists despite normalization of blood pressure,  consider sleep study.

## 2013-02-28 ENCOUNTER — Encounter: Payer: Self-pay | Admitting: Cardiovascular Disease

## 2013-03-04 ENCOUNTER — Ambulatory Visit (HOSPITAL_COMMUNITY)
Admission: RE | Admit: 2013-03-04 | Discharge: 2013-03-04 | Disposition: A | Discharge status: Home / Self Care | Payer: Medicare Other | Source: Ambulatory Visit | Attending: Cardiology | Admitting: Cardiology

## 2013-03-04 DIAGNOSIS — R011 Cardiac murmur, unspecified: Secondary | ICD-10-CM | POA: Insufficient documentation

## 2013-03-04 NOTE — Progress Notes (Signed)
2D Echo Performed 03/04/2013    Marygrace Drought, RCS

## 2013-03-27 ENCOUNTER — Encounter: Payer: Self-pay | Admitting: Cardiovascular Disease

## 2013-03-27 ENCOUNTER — Ambulatory Visit (INDEPENDENT_AMBULATORY_CARE_PROVIDER_SITE_OTHER): Payer: Medicare Other | Admitting: Cardiovascular Disease

## 2013-03-27 VITALS — BP 118/68 | HR 56 | Ht 62.0 in | Wt 235.5 lb

## 2013-03-27 DIAGNOSIS — G4733 Obstructive sleep apnea (adult) (pediatric): Secondary | ICD-10-CM

## 2013-03-27 DIAGNOSIS — I10 Essential (primary) hypertension: Secondary | ICD-10-CM

## 2013-03-27 DIAGNOSIS — E119 Type 2 diabetes mellitus without complications: Secondary | ICD-10-CM

## 2013-03-27 NOTE — Patient Instructions (Addendum)
Your physician has recommended that you have a sleep study. This test records several body functions during sleep, including: brain activity, eye movement, oxygen and carbon dioxide blood levels, heart rate and rhythm, breathing rate and rhythm, the flow of air through your mouth and nose, snoring, body muscle movements, and chest and belly movement.  Your physician has recommended you make the following change in your medication: STOP Amlodipine  Your physician recommends that you schedule a follow-up appointment in: 3 months

## 2013-04-02 ENCOUNTER — Encounter: Payer: Self-pay | Admitting: Nurse Practitioner

## 2013-04-02 ENCOUNTER — Ambulatory Visit (INDEPENDENT_AMBULATORY_CARE_PROVIDER_SITE_OTHER): Payer: Medicare Other | Admitting: Nurse Practitioner

## 2013-04-02 VITALS — BP 129/64 | HR 57 | Temp 97.5°F | Ht 62.0 in | Wt 235.0 lb

## 2013-04-02 DIAGNOSIS — Z124 Encounter for screening for malignant neoplasm of cervix: Secondary | ICD-10-CM

## 2013-04-02 DIAGNOSIS — I10 Essential (primary) hypertension: Secondary | ICD-10-CM

## 2013-04-02 DIAGNOSIS — Z01419 Encounter for gynecological examination (general) (routine) without abnormal findings: Secondary | ICD-10-CM

## 2013-04-02 DIAGNOSIS — F411 Generalized anxiety disorder: Secondary | ICD-10-CM

## 2013-04-02 DIAGNOSIS — Z0000 Encounter for general adult medical examination without abnormal findings: Secondary | ICD-10-CM

## 2013-04-02 DIAGNOSIS — E8881 Metabolic syndrome: Secondary | ICD-10-CM

## 2013-04-02 DIAGNOSIS — R7309 Other abnormal glucose: Secondary | ICD-10-CM

## 2013-04-02 DIAGNOSIS — R7989 Other specified abnormal findings of blood chemistry: Secondary | ICD-10-CM

## 2013-04-02 DIAGNOSIS — M797 Fibromyalgia: Secondary | ICD-10-CM

## 2013-04-02 DIAGNOSIS — IMO0001 Reserved for inherently not codable concepts without codable children: Secondary | ICD-10-CM

## 2013-04-02 LAB — POCT CBC
Lymph, poc: 2.2 (ref 0.6–3.4)
MCH, POC: 30.8 pg (ref 27–31.2)
MCHC: 32.7 g/dL (ref 31.8–35.4)
MCV: 94.1 fL (ref 80–97)
POC LYMPH PERCENT: 37.9 %L (ref 10–50)
Platelet Count, POC: 102 10*3/uL — AB (ref 142–424)
RBC: 4.5 M/uL (ref 4.04–5.48)
RDW, POC: 13.6 %
WBC: 5.9 10*3/uL (ref 4.6–10.2)

## 2013-04-02 LAB — POCT URINALYSIS DIPSTICK
Blood, UA: NEGATIVE
Leukocytes, UA: NEGATIVE
Nitrite, UA: NEGATIVE
Protein, UA: NEGATIVE
Urobilinogen, UA: NEGATIVE
pH, UA: 5

## 2013-04-02 LAB — POCT UA - MICROSCOPIC ONLY
Casts, Ur, LPF, POC: NEGATIVE
Crystals, Ur, HPF, POC: NEGATIVE
Yeast, UA: NEGATIVE

## 2013-04-02 NOTE — Patient Instructions (Addendum)

## 2013-04-02 NOTE — Progress Notes (Signed)
Subjective:    Patient ID: Stacey Summers, female    DOB: [birth year], 67 y.o.   MRN: [id number]  HPI Patient here today for an annual physical exam and PAP- SHe is doing ok- just recently saw her cardiologist - and he stopped her amlodipine because blood pressure was running low-. She has been doing fine since stopping meds and blood pressure has stayed good. Nothing bothering her today. SHe is a diabetic and fasting blood sugars running 120'2 on avergae but patient doesn't check blood sugars very often- She is currently on diet control for diabetes. Patient Active Problem List   Diagnosis Date Noted  . Fatigue March 06, 2013  . Bilateral leg edema 06-Mar-2013  . Murmur, cardiac March 06, 2013  . DM2 (diabetes mellitus, type 2) 03-06-2013  . Morbid obesity 06-Mar-2013  . Family history of sudden death March 06, 2013  . Hyperlipidemia 06-Mar-2013  . Hypertension 12/20/2012  . GAD (generalized anxiety disorder) 12/20/2012  . Fibromyalgia muscle pain 12/20/2012   Outpatient Encounter Prescriptions as of 04/02/2013  Medication Sig Dispense Refill  . ALPRAZolam (XANAX) 0.25 MG tablet Take 0.25 mg by mouth 2 (two) times daily as needed.      Marland Kitchen aspirin 81 MG tablet Take 81 mg by mouth as needed for pain.      Marland Kitchen atorvastatin (LIPITOR) 40 MG tablet Take 1 tablet (40 mg total) by mouth daily.  90 tablet  1  . benazepril (LOTENSIN) 20 MG tablet Take 1 tablet (20 mg total) by mouth daily.  90 tablet  0  . Blood Glucose Monitoring Suppl (LIBERTY BLOOD GLUCOSE MONITOR) DEVI 1 each by Does not apply route daily. Test 1x per day and prn-dx250.02  1 each  0  . cholecalciferol (VITAMIN D) 1000 UNITS tablet Take 1,000 Units by mouth 2 (two) times daily.      . cyclobenzaprine (FLEXERIL) 5 MG tablet Take 1 tablet (5 mg total) by mouth 3 (three) times daily as needed for muscle spasms.  30 tablet  1  . esomeprazole (NEXIUM) 40 MG capsule Take 1 capsule (40 mg total) by mouth daily before breakfast.  90 capsule  6  .  furosemide (LASIX) 40 MG tablet Take 1 tablet (40 mg total) by mouth daily.  90 tablet  0  . glucose blood test strip Test 1x per day and prn- dx 250.02  100 each  12  . metoprolol succinate (TOPROL-XL) 50 MG 24 hr tablet Take 1 tablet (50 mg total) by mouth daily.  90 tablet  0  . ONETOUCH DELICA LANCETS 37S MISC 1 each by Does not apply route daily. Test 1X per day and prn-- dx 250.02  100 each  11  . traMADol (ULTRAM) 50 MG tablet Take 1 tablet by mouth as needed. pain      . [DISCONTINUED] Cholecalciferol (VITAMIN D3) 2000 UNITS TABS Take 1 tablet by mouth daily.       No facility-administered encounter medications on file as of 04/02/2013.       Review of Systems  Constitutional: Negative.   HENT: Negative.   Eyes: Negative.   Respiratory: Positive for shortness of breath (unchanged).   Cardiovascular: Positive for leg swelling.  Endocrine: Negative.   Genitourinary: Negative.   Musculoskeletal: Positive for back pain and arthralgias.  Allergic/Immunologic: Negative.   Neurological: Negative.   Hematological: Negative.   Psychiatric/Behavioral: Negative.        Objective:   Physical Exam  Constitutional: She is oriented to person, place, and time. She appears well-developed and  well-nourished.  HENT:  Head: Normocephalic.  Right Ear: Hearing, tympanic membrane, external ear and ear canal normal.  Left Ear: Hearing, tympanic membrane, external ear and ear canal normal.  Nose: Nose normal.  Mouth/Throat: Uvula is midline and oropharynx is clear and moist.  Eyes: Conjunctivae and EOM are normal. Pupils are equal, round, and reactive to light.  Neck: Normal range of motion and full passive range of motion without pain. Neck supple. No JVD present. Carotid bruit is not present. No mass and no thyromegaly present.  Cardiovascular: Normal rate, normal heart sounds and intact distal pulses.   No murmur heard. Pulmonary/Chest: Effort normal and breath sounds normal. Right breast  exhibits no inverted nipple, no mass, no nipple discharge, no skin change and no tenderness. Left breast exhibits no inverted nipple, no mass, no nipple discharge, no skin change and no tenderness.  Abdominal: Soft. Bowel sounds are normal. She exhibits no mass. There is no tenderness.  Genitourinary: Vagina normal and uterus normal. No breast swelling, tenderness, discharge or bleeding.  bimanual exam-No adnexal masses or tenderness.  Cervix parous and pink - no discharge  Musculoskeletal: Normal range of motion.  Lymphadenopathy:    She has no cervical adenopathy.  Neurological: She is alert and oriented to person, place, and time.  Skin: Skin is warm and dry.  Psychiatric: She has a normal mood and affect. Her behavior is normal. Judgment and thought content normal.   BP 129/64  Pulse 57  Temp(Src) 97.5 F (36.4 C) (Oral)  Ht 5' 2"  (1.575 m)  Wt 235 lb (106.595 kg)  BMI 42.97 kg/m2        Assessment & Plan:   1. Annual physical exam   2. Hypertension   3. GAD (generalized anxiety disorder)   4. Fibromyalgia muscle pain   5. Metabolic syndrome   6. Encounter for routine gynecological examination    Orders Placed This Encounter  Procedures  . CMP14+EGFR  . NMR, lipoprofile  . Thyroid Panel With TSH  . POCT urinalysis dipstick  . POCT UA - Microscopic Only  . POCT CBC    Continue all meds Labs pending Low fat diet and exercise encouraged Follow up in 6 months  Mary-Margaret Hassell Done, FNP

## 2013-04-03 LAB — CMP14+EGFR
ALT: 27 IU/L (ref 0–32)
AST: 42 IU/L — ABNORMAL HIGH (ref 0–40)
Alkaline Phosphatase: 93 IU/L (ref 39–117)
BUN/Creatinine Ratio: 19 (ref 11–26)
CO2: 30 mmol/L — ABNORMAL HIGH (ref 18–29)
Creatinine, Ser: 0.69 mg/dL (ref 0.57–1.00)
Globulin, Total: 2 g/dL (ref 1.5–4.5)
Potassium: 4.5 mmol/L (ref 3.5–5.2)
Sodium: 142 mmol/L (ref 134–144)

## 2013-04-03 LAB — NMR, LIPOPROFILE
HDL Particle Number: 22.5 umol/L — ABNORMAL LOW (ref >=30.5)
LDLC SERPL CALC-MCNC: 62 mg/dL (ref <100)
LP-IR Score: 37 (ref <=45)
Small LDL Particle Number: 493 nmol/L (ref <=527)

## 2013-04-03 LAB — THYROID PANEL WITH TSH: TSH: 3.87 u[IU]/mL (ref 0.450–4.500)

## 2013-04-05 LAB — PAP IG (IMAGE GUIDED): PAP Smear Comment: 0

## 2013-04-09 NOTE — Assessment & Plan Note (Signed)
I will recommend stopping the amlodipine altogether

## 2013-04-09 NOTE — Assessment & Plan Note (Signed)
Recommend a sleep study

## 2013-04-09 NOTE — Progress Notes (Signed)
Patient ID: Stacey Summers, female   DOB: Oct 05, 1945, 67 y.o.   MRN: [id number]      Reason for office visit Followup echocardiogram, blood pressure management  The patient is a 67 year old woman who is morbidly obese. Her husband [name] is also a patient of our practice. She has multiple coronary risk factors that include hypertension diabetes mellitus and hyperlipidemia all of which are being treated with pharmacological means. She complains of feeling tired all the time and becoming easily exhausted. She denies any problems with chest pain although she sometimes has a "strange feeling" in the middle of her chest". She denies any dyspnea at rest but has functional class II dyspnea on exertion. She denies palpitations recently but in the past was prescribed a beta blocker for palpitations, which she still takes. The beta blocker was prescribed, her other antihypertensive medications were not reduced. She has problems with ankle edema that is sometimes severe. Although it is always better early in the morning, it never resolves completely.   We reduced her antihypertensives at the last appointment and she feels better he still occasionally feels fatigued and rather dizzy. Her blood pressure has remained well within normal limits, if not low.  She has numerous symptoms suggestive of possible obstructive sleep apnea including waking up feeling tired, daytime sleepiness, need to take naps, loud snoring. She did not readily admit to the symptoms at her last visit.  Echo showed almost completely normal findings,  including normal left ventricular systolic and diastolic function. The left atrium was mildly dilated  No Known Allergies  Current Outpatient Prescriptions  Medication Sig Dispense Refill  . ALPRAZolam (XANAX) 0.25 MG tablet Take 0.25 mg by mouth 2 (two) times daily as needed.      Marland Kitchen aspirin 81 MG tablet Take 81 mg by mouth as needed for pain.      Marland Kitchen atorvastatin (LIPITOR) 40 MG tablet  Take 1 tablet (40 mg total) by mouth daily.  90 tablet  1  . benazepril (LOTENSIN) 20 MG tablet Take 1 tablet (20 mg total) by mouth daily.  90 tablet  0  . Blood Glucose Monitoring Suppl (LIBERTY BLOOD GLUCOSE MONITOR) DEVI 1 each by Does not apply route daily. Test 1x per day and prn-dx250.02  1 each  0  . cholecalciferol (VITAMIN D) 1000 UNITS tablet Take 1,000 Units by mouth 2 (two) times daily.      . cyclobenzaprine (FLEXERIL) 5 MG tablet Take 1 tablet (5 mg total) by mouth 3 (three) times daily as needed for muscle spasms.  30 tablet  1  . esomeprazole (NEXIUM) 40 MG capsule Take 1 capsule (40 mg total) by mouth daily before breakfast.  90 capsule  6  . furosemide (LASIX) 40 MG tablet Take 1 tablet (40 mg total) by mouth daily.  90 tablet  0  . glucose blood test strip Test 1x per day and prn- dx 250.02  100 each  12  . metoprolol succinate (TOPROL-XL) 50 MG 24 hr tablet Take 1 tablet (50 mg total) by mouth daily.  90 tablet  0  . ONETOUCH DELICA LANCETS 32R MISC 1 each by Does not apply route daily. Test 1X per day and prn-- dx 250.02  100 each  11  . traMADol (ULTRAM) 50 MG tablet Take 1 tablet by mouth as needed. pain       No current facility-administered medications for this visit.    Past Medical History  Diagnosis Date  . Asthma  bronchitis  . Allergy     seasonal  . Arthritis   . GERD (gastroesophageal reflux disease)   . Hyperlipidemia   . Hypertension   . Tubular adenoma of colon 05/09/12    "innumerable number" of flat polyps  . DJD (degenerative joint disease)   . Anxiety     Past Surgical History  Procedure Laterality Date  . Tonsillectomy  1965  . Cholecystectomy  1990  . Cesarean section  1977  . Tumor removal      benign fatty tumors on abdomen  . Polypectomy      vocal cord    Family History  Problem Relation Age of Onset  . Prostate cancer Father     dx in his late 63s  . Colon cancer Father     dx in his 69s  . Heart failure Mother   .  Heart disease Mother   . Other Brother     colon resection  . Colon polyps Brother     multiple large polyps  . Spina bifida Brother     died at birth  . Other Paternal Uncle     colostomy  . Colon cancer Paternal Uncle     in his 80s  . Heart failure Maternal Grandmother   . Heart failure Maternal Uncle   . Stomach cancer Paternal Aunt   . Brain cancer Cousin 19  . Colon cancer Other     fathers cousin  . Kidney cancer Cousin     maternal cousin  . Other Other 16    multiple lipomas    History   Social History  . Marital Status: Married    Spouse Name: N/A    Number of Children: 3  . Years of Education: N/A   Occupational History  . Not on file.   Social History Main Topics  . Smoking status: Former Smoker    Types: Cigarettes    Quit date: 08/15/1969  . Smokeless tobacco: Never Used  . Alcohol Use: No  . Drug Use: No  . Sexual Activity: Not on file   Other Topics Concern  . Not on file   Social History Narrative  . No narrative on file    Review of systems: The patient specifically denies any chest pain at rest or with exertion, dyspnea at rest, orthopnea, paroxysmal nocturnal dyspnea, syncope, palpitations, focal neurological deficits, intermittent claudication, unexplained weight gain, cough, hemoptysis or wheezing.  The patient also denies abdominal pain, nausea, vomiting, dysphagia, diarrhea, constipation, polyuria, polydipsia, dysuria, hematuria, frequency, urgency, abnormal bleeding or bruising, fever, chills, unexpected weight changes, mood swings, change in skin or hair texture, change in voice quality, auditory or visual problems, allergic reactions or rashes, new musculoskeletal complaints other than usual "aches and pains".   PHYSICAL EXAM BP 118/68  Pulse 56  Ht 5' 2"  (1.575 m)  Wt 235 lb 8 oz (106.822 kg)  BMI 43.06 kg/m2 General: Alert, oriented x3, no distress; morbidly obese  Head: no evidence of trauma, PERRL, EOMI, no exophtalmos or  lid lag, no myxedema, no xanthelasma; normal ears, nose and oropharynx  Neck: normal jugular venous pulsations and no hepatojugular reflux; brisk carotid pulses without delay and no carotid bruits  Chest: clear to auscultation, no signs of consolidation by percussion or palpation, normal fremitus, symmetrical and full respiratory excursions  Cardiovascular: normal position and quality of the apical impulse, regular rhythm, normal first and second heart sounds, grade 2/6 systolic murmur heard best in the aortic focus but  with a holosystolic rather than Crescenta decrescendo pattern, rubs or gallops  Abdomen: no tenderness or distention, no masses by palpation, no abnormal pulsatility or arterial bruits, normal bowel sounds, no hepatosplenomegaly  Extremities: no clubbing, cyanosis or edema; 2+ radial, ulnar and brachial pulses bilaterally; 2+ right femoral, posterior tibial and dorsalis pedis pulses; 2+ left femoral, posterior tibial and dorsalis pedis pulses; no subclavian or femoral bruits  Neurological: grossly nonfocal  EKG: Sinus bradycardia otherwise normal   Lipid Panel     Component Value Date/Time   CHOL 122 04/02/2013 1122   TRIG 83 12/20/2012 1044   LDLCALC 103* 12/20/2012 1044    BMET    Component Value Date/Time   NA 142 04/02/2013 1122   NA 142 12/20/2012 1044   K 4.5 04/02/2013 1122   CL 101 04/02/2013 1122   CO2 30* 04/02/2013 1122   GLUCOSE 138* 04/02/2013 1122   GLUCOSE 134* 12/20/2012 1044   BUN 13 04/02/2013 1122   BUN 13 12/20/2012 1044   CREATININE 0.69 04/02/2013 1122   CREATININE 0.67 12/20/2012 1044   CALCIUM 9.0 04/02/2013 1122   GFRNONAA 92 04/02/2013 1122   GFRAA 106 04/02/2013 1122     ASSESSMENT AND PLAN Hypertension I will recommend stopping the amlodipine altogether  Morbid obesity Recommend a sleep study   hopefully allowing her blood pressure rise will improve her fatigue. If not, sleep apnea could explain many of her complaints. Followup in 3 months. Orders  Placed This Encounter  Procedures  . Split night study   Meds ordered this encounter  Medications  . DISCONTD: amLODipine (NORVASC) 5 MG tablet    Sig: Take 2.5 mg by mouth daily.    Holli Humbles, MD, [place] [facility] [place] [phone] office [phone] pager

## 2013-04-16 DIAGNOSIS — G4733 Obstructive sleep apnea (adult) (pediatric): Secondary | ICD-10-CM

## 2013-04-16 LAB — POCT GLYCOSYLATED HEMOGLOBIN (HGB A1C): Hemoglobin A1C: 6.3

## 2013-04-16 NOTE — Addendum Note (Signed)
Addended by: Lyn Henri C on: 04/16/2013 12:22 PM   Modules accepted: Orders

## 2013-04-22 ENCOUNTER — Other Ambulatory Visit: Payer: Self-pay | Admitting: *Deleted

## 2013-04-22 MED ORDER — METOPROLOL SUCCINATE ER 50 MG PO TB24: 50.0000 mg | ORAL_TABLET | Freq: Every day | ORAL | Status: DC

## 2013-04-22 MED ORDER — FUROSEMIDE 40 MG PO TABS: 40.0000 mg | ORAL_TABLET | Freq: Every day | ORAL | Status: DC

## 2013-04-22 MED ORDER — BENAZEPRIL HCL 20 MG PO TABS: 20.0000 mg | ORAL_TABLET | Freq: Every day | ORAL | Status: DC

## 2013-04-22 NOTE — Telephone Encounter (Signed)
PT NEEDS MAIL ORDER. PLEASE PRINT. LAST OV 8/14.

## 2013-05-07 DIAGNOSIS — G4733 Obstructive sleep apnea (adult) (pediatric): Secondary | ICD-10-CM

## 2013-05-23 ENCOUNTER — Telehealth: Payer: Self-pay | Admitting: Cardiovascular Disease

## 2013-05-23 NOTE — Telephone Encounter (Signed)
[facility]le[facility]ae call-wants to know  Where she need to go to be fitted for her sleep mask?

## 2013-05-24 ENCOUNTER — Telehealth: Payer: Self-pay | Admitting: *Deleted

## 2013-05-24 NOTE — Telephone Encounter (Signed)
Informed patient that [facility] will be handling her CPAP and supplies. She informs me they have called her twice, however she missed their calls. I put a staff message into [facility] to have them to contact her.

## 2013-06-05 ENCOUNTER — Telehealth: Payer: Self-pay | Admitting: *Deleted

## 2013-06-05 ENCOUNTER — Other Ambulatory Visit: Payer: Self-pay

## 2013-06-05 DIAGNOSIS — L918 Other hypertrophic disorders of the skin: Secondary | ICD-10-CM

## 2013-06-05 MED ORDER — ATORVASTATIN CALCIUM 40 MG PO TABS: 40.0000 mg | ORAL_TABLET | Freq: Every day | ORAL | Status: DC

## 2013-06-05 NOTE — Telephone Encounter (Signed)
Patient states she has skin tags around eyes and you have removed some before. There are two close to her eye and you said she would need a referral to have those removed. Would like to go to Dr Stacey Summers in [place] if possible. Please advise

## 2013-06-05 NOTE — Telephone Encounter (Signed)
rx ready for pick up

## 2013-06-05 NOTE — Telephone Encounter (Signed)
Patient states that she has already picked up a rx for the lipitor and does not need this one.

## 2013-06-05 NOTE — Telephone Encounter (Signed)
Referral made 

## 2013-06-05 NOTE — Telephone Encounter (Signed)
Last seen and last lipid 04/02/13  MMM If approved print for mail order and route to nurse

## 2013-06-28 ENCOUNTER — Ambulatory Visit: Payer: Medicare Other | Admitting: Cardiovascular Disease

## 2013-07-01 ENCOUNTER — Ambulatory Visit: Payer: Medicare Other | Admitting: Cardiovascular Disease

## 2013-07-02 ENCOUNTER — Telehealth: Payer: Self-pay | Admitting: Nurse Practitioner

## 2013-07-02 MED ORDER — ATORVASTATIN CALCIUM 40 MG PO TABS: 40.0000 mg | ORAL_TABLET | Freq: Every day | ORAL | Status: DC

## 2013-07-02 MED ORDER — ALPRAZOLAM 0.25 MG PO TABS: 0.2500 mg | ORAL_TABLET | Freq: Two times a day (BID) | ORAL | Status: DC | PRN

## 2013-07-02 MED ORDER — TRAMADOL HCL 50 MG PO TABS: 50.0000 mg | ORAL_TABLET | ORAL | Status: DC | PRN

## 2013-07-02 NOTE — Telephone Encounter (Signed)
Patient aware.

## 2013-07-02 NOTE — Telephone Encounter (Signed)
rx are ready for pick up- NTBS for labs

## 2013-07-08 ENCOUNTER — Telehealth: Payer: Self-pay | Admitting: Nurse Practitioner

## 2013-07-08 NOTE — Telephone Encounter (Signed)
Let pharmacy know that she takes BID prn

## 2013-07-10 ENCOUNTER — Ambulatory Visit: Payer: Medicare Other | Admitting: Cardiovascular Disease

## 2013-07-22 ENCOUNTER — Ambulatory Visit (INDEPENDENT_AMBULATORY_CARE_PROVIDER_SITE_OTHER): Payer: Medicare Other | Admitting: Cardiovascular Disease

## 2013-07-22 ENCOUNTER — Other Ambulatory Visit: Payer: Self-pay | Admitting: Nurse Practitioner

## 2013-07-22 ENCOUNTER — Encounter: Payer: Self-pay | Admitting: Cardiovascular Disease

## 2013-07-22 VITALS — BP 149/71 | HR 60 | Resp 16 | Ht 62.0 in | Wt 236.8 lb

## 2013-07-22 DIAGNOSIS — E785 Hyperlipidemia, unspecified: Secondary | ICD-10-CM

## 2013-07-22 DIAGNOSIS — I10 Essential (primary) hypertension: Secondary | ICD-10-CM

## 2013-07-22 MED ORDER — BENAZEPRIL HCL 40 MG PO TABS: 40.0000 mg | ORAL_TABLET | Freq: Every day | ORAL | Status: DC

## 2013-07-22 NOTE — Telephone Encounter (Signed)
rx ready for pick up

## 2013-07-22 NOTE — Telephone Encounter (Signed)
Patient aware rx up front to pick up

## 2013-07-22 NOTE — Patient Instructions (Addendum)
Increase Benazepril to 37m daily.  A new Rx has been sent to [facility].  Dr. CLoletha Grayer recommends that you schedule a follow-up appointment in: 6 months

## 2013-07-28 ENCOUNTER — Encounter: Payer: Self-pay | Admitting: Cardiovascular Disease

## 2013-07-28 NOTE — Progress Notes (Signed)
Patient ID: Stacey Summers, female   DOB: [birth year], 67 y.o.   MRN: [id number]      Reason for office visit Sleep apnea  Stacey Summers returns for followup after being diagnosed with obstructive sleep apnea in starting treatment with CPAP. She now feels that she sleeps better, would longer periods of interrupted sleep. She continues to have bilateral ankle swelling, as before more prominent on the left than on the right side. She had a virtually normal echocardiogram with the exception of mild left atrial dilatation. Left ventricular systolic and diastolic function were both normal. There was no clear evidence of pulmonary hypertension. She has cheated systemic hypertension and is morbidly obese.  She continues to have occasional strange feelings in her chest that occur on and off and always at rest. She now believes that they're due to fibromyalgia. There is little change in her leg swelling or in her mild exertional shortness of breath.   No Known Allergies  Current Outpatient Prescriptions  Medication Sig Dispense Refill  . ALPRAZolam (XANAX) 0.25 MG tablet Take 1 tablet (0.25 mg total) by mouth 2 (two) times daily as needed.  30 tablet  0  . aspirin 81 MG tablet Take 81 mg by mouth as needed for pain.      Marland Kitchen atorvastatin (LIPITOR) 40 MG tablet Take 1 tablet (40 mg total) by mouth daily.  90 tablet  1  . Blood Glucose Monitoring Suppl (LIBERTY BLOOD GLUCOSE MONITOR) DEVI 1 each by Does not apply route daily. Test 1x per day and prn-dx250.02  1 each  0  . cholecalciferol (VITAMIN D) 1000 UNITS tablet Take 1,000 Units by mouth 2 (two) times daily.      . cyclobenzaprine (FLEXERIL) 5 MG tablet Take 1 tablet (5 mg total) by mouth 3 (three) times daily as needed for muscle spasms.  30 tablet  1  . esomeprazole (NEXIUM) 40 MG capsule Take 1 capsule (40 mg total) by mouth daily before breakfast.  90 capsule  6  . furosemide (LASIX) 40 MG tablet Take 1 tablet (40 mg total) by mouth daily.  90 tablet  1    . glucose blood test strip Test 1x per day and prn- dx 250.02  100 each  12  . metoprolol succinate (TOPROL-XL) 50 MG 24 hr tablet Take 1 tablet (50 mg total) by mouth daily.  90 tablet  1  . ONETOUCH DELICA LANCETS 66Y MISC 1 each by Does not apply route daily. Test 1X per day and prn-- dx 250.02  100 each  11  . traMADol (ULTRAM) 50 MG tablet TAKE ONE TABLET BY MOUTH AS NEEDED FOR PAIN  30 tablet  0  . benazepril (LOTENSIN) 40 MG tablet Take 1 tablet (40 mg total) by mouth daily.  90 tablet  3   No current facility-administered medications for this visit.    Past Medical History  Diagnosis Date  . Asthma     bronchitis  . Allergy     seasonal  . Arthritis   . GERD (gastroesophageal reflux disease)   . Hyperlipidemia   . Hypertension   . Tubular adenoma of colon 05/09/12    "innumerable number" of flat polyps  . DJD (degenerative joint disease)   . Anxiety     Past Surgical History  Procedure Laterality Date  . Tonsillectomy  1965  . Cholecystectomy  1990  . Cesarean section  1977  . Tumor removal      benign fatty tumors on abdomen  .  Polypectomy      vocal cord    Family History  Problem Relation Age of Onset  . Prostate cancer Father     dx in his late 11s  . Colon cancer Father     dx in his 39s  . Heart failure Mother   . Heart disease Mother   . Other Brother     colon resection  . Colon polyps Brother     multiple large polyps  . Spina bifida Brother     died at birth  . Other Paternal Uncle     colostomy  . Colon cancer Paternal Uncle     in his 35s  . Heart failure Maternal Grandmother   . Heart failure Maternal Uncle   . Stomach cancer Paternal Aunt   . Brain cancer Cousin 19  . Colon cancer Other     fathers cousin  . Kidney cancer Cousin     maternal cousin  . Other Other 16    multiple lipomas    History   Social History  . Marital Status: Married    Spouse Name: N/A    Number of Children: 3  . Years of Education: N/A    Occupational History  . Not on file.   Social History Main Topics  . Smoking status: Former Smoker    Types: Cigarettes    Quit date: 08/15/1969  . Smokeless tobacco: Never Used  . Alcohol Use: No  . Drug Use: No  . Sexual Activity: Not on file   Other Topics Concern  . Not on file   Social History Narrative  . No narrative on file    Review of systems: The patient specifically denies any chest pain at rest or with exertion, dyspnea at rest, orthopnea, paroxysmal nocturnal dyspnea, syncope, palpitations, focal neurological deficits, intermittent claudication, unexplained weight gain, cough, hemoptysis or wheezing.  The patient also denies abdominal pain, nausea, vomiting, dysphagia, diarrhea, constipation, polyuria, polydipsia, dysuria, hematuria, frequency, urgency, abnormal bleeding or bruising, fever, chills, unexpected weight changes, mood swings, change in skin or hair texture, change in voice quality, auditory or visual problems, allergic reactions or rashes, new musculoskeletal complaints other than usual "aches and pains".   PHYSICAL EXAM BP 149/71  Pulse 60  Resp 16  Ht 5' 2"  (1.575 m)  Wt 236 lb 12.8 oz (107.412 kg)  BMI 43.30 kg/m2 General: Alert, oriented x3, no distress; morbidly obese  Head: no evidence of trauma, PERRL, EOMI, no exophtalmos or lid lag, no myxedema, no xanthelasma; normal ears, nose and oropharynx  Neck: normal jugular venous pulsations and no hepatojugular reflux; brisk carotid pulses without delay and no carotid bruits  Chest: clear to auscultation, no signs of consolidation by percussion or palpation, normal fremitus, symmetrical and full respiratory excursions  Cardiovascular: normal position and quality of the apical impulse, regular rhythm, normal first and second heart sounds, grade 2/6 systolic murmur heard best in the aortic focus but with a holosystolic rather than Crescenta decrescendo pattern, rubs or gallops  Abdomen: no tenderness  or distention, no masses by palpation, no abnormal pulsatility or arterial bruits, normal bowel sounds, no hepatosplenomegaly  Extremities: no clubbing, cyanosis; of left ankle edema; 2+ radial, ulnar and brachial pulses bilaterally; 2+ right femoral, posterior tibial and dorsalis pedis pulses; 2+ left femoral, posterior tibial and dorsalis pedis pulses; no subclavian or femoral bruits  Neurological: grossly nonfocal   EKG: Sinus rhythm, mild delay in anterior wall R wave progression, otherwise normal   Lipid  Panel     Component Value Date/Time   CHOL 122 04/02/2013 1122   TRIG 83 12/20/2012 1044   LDLCALC 103* 12/20/2012 1044    BMET    Component Value Date/Time   NA 142 04/02/2013 1122   NA 142 12/20/2012 1044   K 4.5 04/02/2013 1122   CL 101 04/02/2013 1122   CO2 30* 04/02/2013 1122   GLUCOSE 138* 04/02/2013 1122   GLUCOSE 134* 12/20/2012 1044   BUN 13 04/02/2013 1122   BUN 13 12/20/2012 1044   CREATININE 0.69 04/02/2013 1122   CREATININE 0.67 12/20/2012 1044   CALCIUM 9.0 04/02/2013 1122   GFRNONAA 92 04/02/2013 1122   GFRAA 106 04/02/2013 1122     ASSESSMENT AND PLAN  I encouraged full compliance with CPAP therapy. Her blood pressure is a little bit too high and I increased her benazepril to 40 mg daily. Importance of weight reduction and it's positive impact on her sleep apnea and systemic hypertension were reviewed. Sodium restriction and leg elevation should help with her ankle swelling, which appears to be primarily secondary to local venous insufficiency problems  Patient Instructions  Increase Benazepril to 80m daily.  A new Rx has been sent to [facility].  Dr. CLoletha Grayer recommends that you schedule a follow-up appointment in: 6 months     Orders Placed This Encounter  Procedures  . EKG 12-Lead   Landon Truax  MSanda Klein MD, [facility] ([phone] [phone]

## 2013-07-30 ENCOUNTER — Ambulatory Visit: Payer: Medicare Other | Admitting: Cardiovascular Disease

## 2013-08-01 ENCOUNTER — Encounter: Payer: Self-pay | Admitting: Cardiovascular Disease

## 2013-08-06 ENCOUNTER — Telehealth: Payer: Self-pay | Admitting: *Deleted

## 2013-08-06 NOTE — Telephone Encounter (Signed)
Faxed requested face to face office note from 07/28/13 to [facility].

## 2013-08-21 ENCOUNTER — Encounter: Payer: Self-pay | Admitting: Gastroenterology

## 2013-09-02 ENCOUNTER — Ambulatory Visit (AMBULATORY_SURGERY_CENTER): Payer: Self-pay | Admitting: *Deleted

## 2013-09-02 VITALS — Ht 62.0 in | Wt 239.0 lb

## 2013-09-02 DIAGNOSIS — Z86010 Personal history of colonic polyps: Secondary | ICD-10-CM

## 2013-09-02 MED ORDER — MOVIPREP 100 G PO SOLR: ORAL | Status: DC

## 2013-09-02 NOTE — Progress Notes (Signed)
No allergies to eggs or soy. No problems with anesthesia.

## 2013-09-03 ENCOUNTER — Encounter: Payer: Self-pay | Admitting: Gastroenterology

## 2013-09-04 ENCOUNTER — Telehealth: Payer: Self-pay | Admitting: *Deleted

## 2013-09-04 NOTE — Telephone Encounter (Signed)
Pt takes Metoprolol 50 mg and she gets them filled with [facility] and they are $15.00 she wants to change to a different medication so that it is free. She asked if someone could call her back so she could explain.  [place]

## 2013-09-05 MED ORDER — METOPROLOL TARTRATE 25 MG PO TABS: 25.0000 mg | ORAL_TABLET | Freq: Two times a day (BID) | ORAL | Status: DC

## 2013-09-05 NOTE — Telephone Encounter (Signed)
Returned call and informed pt per instructions by MD.  Pt verbalized understanding and agreed w/ plan.  Rx sent to pharmacy.

## 2013-09-05 NOTE — Telephone Encounter (Signed)
Returned call and pt verified x 2.  Pt stated the metoprolol didn't cost her anything last year.  Stated this year it costs $15.  Stated her husband is on the "tart" and it doesn't cost anything.  Pt requesting to be switched to metoprolol tartrate and sent to [facility].  Pt informed Dr. Sallyanne Kuster will be notified.  Pt verbalized understanding and agreed w/ plan.  Message forwarded to Dr. Sallyanne Kuster.

## 2013-09-05 NOTE — Telephone Encounter (Signed)
Ok to switch to metoprolol tartrate 25 mg bid. Please explain that it has to be a twice daily medication due to shorter duration of action. [place]

## 2013-09-16 ENCOUNTER — Encounter: Payer: Self-pay | Admitting: Gastroenterology

## 2013-09-16 ENCOUNTER — Ambulatory Visit (AMBULATORY_SURGERY_CENTER): Payer: Medicare Other | Admitting: Gastroenterology

## 2013-09-16 VITALS — BP 132/69 | HR 70 | Temp 94.0°F | Resp 20 | Ht 62.0 in | Wt 239.0 lb

## 2013-09-16 DIAGNOSIS — D126 Benign neoplasm of colon, unspecified: Secondary | ICD-10-CM

## 2013-09-16 DIAGNOSIS — K5730 Diverticulosis of large intestine without perforation or abscess without bleeding: Secondary | ICD-10-CM

## 2013-09-16 DIAGNOSIS — Z86010 Personal history of colonic polyps: Secondary | ICD-10-CM

## 2013-09-16 MED ORDER — SODIUM CHLORIDE 0.9 % IV SOLN: 500.0000 mL | INTRAVENOUS | Status: DC

## 2013-09-16 NOTE — Patient Instructions (Addendum)
YOU HAD AN ENDOSCOPIC PROCEDURE TODAY AT [facility]: Refer to the procedure report that was given to you for any specific questions about what was found during the examination.  If the procedure report does not answer your questions, please call your gastroenterologist to clarify.  If you requested that your care partner not be given the details of your procedure findings, then the procedure report has been included in a sealed envelope for you to review at your convenience later.  YOU SHOULD EXPECT: Some feelings of bloating in the abdomen. Passage of more gas than usual.  Walking can help get rid of the air that was put into your GI tract during the procedure and reduce the bloating. If you had a lower endoscopy (such as a colonoscopy or flexible sigmoidoscopy) you may notice spotting of blood in your stool or on the toilet paper. If you underwent a bowel prep for your procedure, then you may not have a normal bowel movement for a few days.  DIET: Your first meal following the procedure should be a light meal and then it is ok to progress to your normal diet.  A half-sandwich or bowl of soup is an example of a good first meal.  Heavy or fried foods are harder to digest and may make you feel nauseous or bloated.  Likewise meals heavy in dairy and vegetables can cause extra gas to form and this can also increase the bloating.  Drink plenty of fluids but you should avoid alcoholic beverages for 24 hours.  ACTIVITY: Your care partner should take you home directly after the procedure.  You should plan to take it easy, moving slowly for the rest of the day.  You can resume normal activity the day after the procedure however you should NOT DRIVE or use heavy machinery for 24 hours (because of the sedation medicines used during the test).    SYMPTOMS TO REPORT IMMEDIATELY: A gastroenterologist can be reached at any hour.  During normal business hours, 8:30 AM to 5:00 PM Monday through Friday,  call [phone].  After hours and on weekends, please call the GI answering service at [phone] who will take a message and have the physician on call contact you.   Following lower endoscopy (colonoscopy or flexible sigmoidoscopy):  Excessive amounts of blood in the stool  Significant tenderness or worsening of abdominal pains  Swelling of the abdomen that is new, acute  Fever of 100F or higher    FOLLOW UP: If any biopsies were taken you will be contacted by phone or by letter within the next 1-3 weeks.  Call your gastroenterologist if you have not heard about the biopsies in 3 weeks.  Our staff will call the home number listed on your records the next business day following your procedure to check on you and address any questions or concerns that you may have at that time regarding the information given to you following your procedure. This is a courtesy call and so if there is no answer at the home number and we have not heard from you through the emergency physician on call, we will assume that you have returned to your regular daily activities without incident.  SIGNATURES/CONFIDENTIALITY: You and/or your care partner have signed paperwork which will be entered into your electronic medical record.  These signatures attest to the fact that that the information above on your After Visit Summary has been reviewed and is understood.  Full responsibility of the confidentiality  of this discharge information lies with you and/or your care-partner.   INFORMATION ON POLYPS,DIVERTICULOSIS , AND HIGH FIBER DIET GIVEN TO YOU TODAY

## 2013-09-16 NOTE — Progress Notes (Signed)
Procedure ends, to recovery, report given and VSS. 

## 2013-09-16 NOTE — Progress Notes (Addendum)
Called to room post procedure to place pathology order. Per staff hepatic flexure polyp x1, hot snare and second bottle sigmoid polyp x1 hot snare, both retrieved. Patient's identity confirmed with staff and chart.

## 2013-09-16 NOTE — Op Note (Signed)
[place]  [address]. [place], [address]   COLONOSCOPY PROCEDURE REPORT  PATIENT: [name], Dobberstein  MR#: [id number] BIRTHDATE: June 30, 1946 , 63  yrs. old GENDER: Female ENDOSCOPIST: Sable Feil, MD, [facility] REFERRED BY: PROCEDURE DATE:  09/16/2013 PROCEDURE:   Colonoscopy with snare polypectomy First Screening Colonoscopy - Avg.  risk and is 50 yrs.  old or older - No.  Prior Negative Screening - Now for repeat screening. N/A  History of Adenoma - Now for follow-up colonoscopy & has been > or = to 3 yrs.  Polyps Removed Today? Yes. ASA CLASS:   Class III INDICATIONS:Patient's personal history of adenomatous colon polyps.  MEDICATIONS: propofol (Diprivan) 241m IV  DESCRIPTION OF PROCEDURE:   After the risks benefits and alternatives of the procedure were thoroughly explained, informed consent was obtained.  A digital rectal exam revealed no abnormalities of the rectum.   The LB [id number] endoscope was introduced through the anus and advanced to the cecum, which was identified by both the appendix and ileocecal valve. No adverse events experienced.   The quality of the prep was excellent, using MoviPrep  The instrument was then slowly withdrawn as the colon was fully examined.      COLON FINDINGS: There was severe diverticulosis noted in the sigmoid colon with associated muscular hypertrophy and colonic narrowing. A polypoid shaped sessile polyp ranging between 5-973min size was found at the hepatic flexure.  A polypectomy was performed using snare cautery.  The resection was complete and the polyp tissue was completely retrieved.   A smooth sessile polyp ranging between 3-74m574mn size was found in the sigmoid colon.  A polypectomy was performed using snare cautery.  The resection was complete and the polyp tissue was completely retrieved.  Retroflexed views revealed no abnormalities. The time to cecum=5 minutes 28 seconds. Withdrawal  time=9 minutes 20 seconds.  The scope was withdrawn and the procedure completed. COMPLICATIONS: There were no complications.  ENDOSCOPIC IMPRESSION: 1.   There was severe diverticulosis noted in the sigmoid colon 2.   Sessile polyp ranging between 5-9mm71m size was found at the hepatic flexure; polypectomy was performed using snare cautery 3.   Sessile polyp ranging between 3-74mm 574msize was found in the sigmoid colon; polypectomy was performed using snare cautery  RECOMMENDATIONS: 1.  Await pathology results 2.  High fiber diet 3.  Repeat Colonoscopy in 3 years.   eSigned:  DavidSable Feil FACG [facility]2/2015 9:21 AM   cc: DonalRedge Gainer  PATIENT NAME:  Stacey Summers, Guin [id number]

## 2013-09-17 ENCOUNTER — Telehealth: Payer: Self-pay

## 2013-09-17 NOTE — Telephone Encounter (Signed)
  Follow up Call-  Call back number 09/16/2013 10/10/2012 05/09/2012  Post procedure Call Back phone  # [phone] cell [phone] cell [phone]  Permission to leave phone message Yes Yes Yes     Patient questions:  Do you have a fever, pain , or abdominal swelling? no Pain Score  0 *  Have you tolerated food without any problems? yes  Have you been able to return to your normal activities? yes  Do you have any questions about your discharge instructions: Diet   no Medications  no Follow up visit  no  Do you have questions or concerns about your Care? no  Actions: * If pain score is 4 or above: No action needed, pain <4.

## 2013-09-18 NOTE — Addendum Note (Signed)
Addended by: Lowry Ram on: 09/18/2013 11:08 AM   Modules accepted: Level of Service

## 2013-09-20 ENCOUNTER — Encounter: Payer: Self-pay | Admitting: Gastroenterology

## 2013-10-16 ENCOUNTER — Ambulatory Visit (INDEPENDENT_AMBULATORY_CARE_PROVIDER_SITE_OTHER): Payer: Medicare Other | Admitting: Nurse Practitioner

## 2013-10-16 ENCOUNTER — Encounter: Payer: Self-pay | Admitting: Nurse Practitioner

## 2013-10-16 VITALS — BP 132/60 | HR 58 | Temp 97.2°F | Ht 62.0 in | Wt 236.0 lb

## 2013-10-16 DIAGNOSIS — M797 Fibromyalgia: Secondary | ICD-10-CM

## 2013-10-16 DIAGNOSIS — R609 Edema, unspecified: Secondary | ICD-10-CM

## 2013-10-16 DIAGNOSIS — R600 Localized edema: Secondary | ICD-10-CM

## 2013-10-16 DIAGNOSIS — R5381 Other malaise: Secondary | ICD-10-CM

## 2013-10-16 DIAGNOSIS — E119 Type 2 diabetes mellitus without complications: Secondary | ICD-10-CM

## 2013-10-16 DIAGNOSIS — R5383 Other fatigue: Secondary | ICD-10-CM

## 2013-10-16 DIAGNOSIS — F411 Generalized anxiety disorder: Secondary | ICD-10-CM

## 2013-10-16 DIAGNOSIS — IMO0001 Reserved for inherently not codable concepts without codable children: Secondary | ICD-10-CM

## 2013-10-16 DIAGNOSIS — I10 Essential (primary) hypertension: Secondary | ICD-10-CM

## 2013-10-16 DIAGNOSIS — E785 Hyperlipidemia, unspecified: Secondary | ICD-10-CM

## 2013-10-16 LAB — POCT GLYCOSYLATED HEMOGLOBIN (HGB A1C): HEMOGLOBIN A1C: 7.2

## 2013-10-16 MED ORDER — FUROSEMIDE 40 MG PO TABS: ORAL_TABLET | ORAL | Status: DC

## 2013-10-16 NOTE — Progress Notes (Signed)
Subjective:    Patient ID: Stacey Summers, female    DOB: 08-Dec-1945, 68 y.o.   MRN: [id number]  Hypertension This is a chronic problem. The current episode started more than 1 year ago. The problem is unchanged. The problem is controlled. Associated symptoms include peripheral edema. Pertinent negatives include no chest pain, headaches, malaise/fatigue, orthopnea, palpitations, shortness of breath or sweats. Risk factors for coronary artery disease include diabetes mellitus, dyslipidemia and obesity. Past treatments include ACE inhibitors and beta blockers. Compliance problems include diet and exercise.   Hyperlipidemia This is a chronic problem. The current episode started more than 1 year ago. The problem is uncontrolled. Recent lipid tests were reviewed and are high. Exacerbating diseases include diabetes and obesity. Pertinent negatives include no chest pain or shortness of breath. Current antihyperlipidemic treatment includes statins. The current treatment provides moderate improvement of lipids. Compliance problems include adherence to diet and adherence to exercise.  Risk factors for coronary artery disease include dyslipidemia, diabetes mellitus, obesity and post-menopausal.  Diabetes She presents for her follow-up diabetic visit. She has type 2 diabetes mellitus. No MedicAlert identification noted. Her disease course has been fluctuating. Pertinent negatives for hypoglycemia include no headaches or sweats. Pertinent negatives for diabetes include no chest pain, no polydipsia, no polyphagia, no polyuria, no visual change and no weight loss. Current diabetic treatment includes diet. She is compliant with treatment some of the time. Her weight is stable. When asked about meal planning, she reported none. She has not had a previous visit with a dietician. She rarely participates in exercise. Her breakfast blood glucose is taken between 9-10 am. Her breakfast blood glucose range is generally  130-140 mg/dl. (Check very often) An ACE inhibitor/angiotensin II receptor blocker is being taken. She does not see a podiatrist.Eye exam is not current.  Fibromyalgia Taking ultram and flexeril that makes pain tolerable- she has good days and bad days GAD Xanax works well by=ut has to take BID_0 GERD Currently on nexium- keeps symptoms under control. Peripheral edema Has gotten worse since her last colonoscopy- takes lasix 40 mg daily   Review of Systems  Constitutional: Negative for weight loss and malaise/fatigue.  Respiratory: Negative for shortness of breath.   Cardiovascular: Negative for chest pain, palpitations and orthopnea.  Endocrine: Negative for polydipsia, polyphagia and polyuria.  Neurological: Negative for headaches.       Objective:   Physical Exam  Constitutional: She is oriented to person, place, and time. She appears well-developed and well-nourished.  HENT:  Nose: Nose normal.  Mouth/Throat: Oropharynx is clear and moist.  Eyes: EOM are normal.  Neck: Trachea normal, normal range of motion and full passive range of motion without pain. Neck supple. No JVD present. Carotid bruit is not present. No thyromegaly present.  Cardiovascular: Normal rate, regular rhythm, normal heart sounds and intact distal pulses.  Exam reveals no gallop and no friction rub.   No murmur heard. Pulmonary/Chest: Effort normal and breath sounds normal.  Abdominal: Soft. Bowel sounds are normal. She exhibits no distension and no mass. There is no tenderness.  Musculoskeletal: Normal range of motion. She exhibits edema (2+ bilaterally).  Lymphadenopathy:    She has no cervical adenopathy.  Neurological: She is alert and oriented to person, place, and time. She has normal reflexes.  Multiple areas of tenderness up and down back  Skin: Skin is warm and dry.  Psychiatric: She has a normal mood and affect. Her behavior is normal. Judgment and thought content normal.  BP 132/60  Pulse 58   Temp(Src) 97.2 F (36.2 C) (Oral)  Ht 5' 2" (1.575 m)  Wt 236 lb (107.049 kg)  BMI 43.15 kg/m2  Results for orders placed in visit on 10/16/13  POCT GLYCOSYLATED HEMOGLOBIN (HGB A1C)      Result Value Ref Range   Hemoglobin A1C 7.2           Assessment & Plan:   1. DM2 (diabetes mellitus, type 2)   2. Hyperlipidemia   3. Hypertension   4. GAD (generalized anxiety disorder)   5. Fibromyalgia muscle pain   6. Fatigue   7. Bilateral leg edema   8. Morbid obesity    Orders Placed This Encounter  Procedures  . CMP14+EGFR  . NMR, lipoprofile  . POCT glycosylated hemoglobin (Hb A1C)   Meds ordered this encounter  Medications  . furosemide (LASIX) 40 MG tablet    Sig: 1 po qam and 1/2 tablet po at 1 in afternoon    Dispense:  135 tablet    Refill:  1    Order Specific Question:  Supervising Provider    Answer:  Joycelyn Man   increased lasix as indicated Labs pending Health maintenance reviewed Diet and exercise encouraged- strict low carb diet Continue all meds Follow up  In 3 months   Brooks, FNP

## 2013-10-16 NOTE — Patient Instructions (Signed)

## 2013-10-18 LAB — CMP14+EGFR
A/G RATIO: 1.9 (ref 1.1–2.5)
ALBUMIN: 4 g/dL (ref 3.6–4.8)
ALT: 30 IU/L (ref 0–32)
AST: 33 IU/L (ref 0–40)
Alkaline Phosphatase: 99 IU/L (ref 39–117)
BILIRUBIN TOTAL: 1.6 mg/dL — AB (ref 0.0–1.2)
BUN/Creatinine Ratio: 16 (ref 11–26)
BUN: 13 mg/dL (ref 8–27)
CALCIUM: 9.1 mg/dL (ref 8.7–10.3)
CO2: 25 mmol/L (ref 18–29)
CREATININE: 0.8 mg/dL (ref 0.57–1.00)
Chloride: 100 mmol/L (ref 97–108)
GFR, EST AFRICAN AMERICAN: 89 mL/min/{1.73_m2} (ref >59)
GFR, EST NON AFRICAN AMERICAN: 78 mL/min/{1.73_m2} (ref >59)
GLOBULIN, TOTAL: 2.1 g/dL (ref 1.5–4.5)
GLUCOSE: 143 mg/dL — AB (ref 65–99)
Potassium: 3.9 mmol/L (ref 3.5–5.2)
Sodium: 145 mmol/L — ABNORMAL HIGH (ref 134–144)
TOTAL PROTEIN: 6.1 g/dL (ref 6.0–8.5)

## 2013-10-18 LAB — NMR, LIPOPROFILE
CHOLESTEROL: 116 mg/dL (ref <200)
HDL CHOLESTEROL BY NMR: 42 mg/dL (ref >=40)
HDL Particle Number: 23.9 umol/L — ABNORMAL LOW (ref >=30.5)
LDL Particle Number: 842 nmol/L (ref <1000)
LDL SIZE: 21 nm (ref >20.5)
LDLC SERPL CALC-MCNC: 60 mg/dL (ref <100)
LP-IR Score: 27 (ref <=45)
SMALL LDL PARTICLE NUMBER: 224 nmol/L (ref <=527)
TRIGLYCERIDES BY NMR: 71 mg/dL (ref <150)

## 2013-12-23 ENCOUNTER — Telehealth: Payer: Self-pay | Admitting: Nurse Practitioner

## 2013-12-23 MED ORDER — ALPRAZOLAM 0.25 MG PO TABS: 0.2500 mg | ORAL_TABLET | Freq: Two times a day (BID) | ORAL | Status: DC | PRN

## 2013-12-23 MED ORDER — TRAMADOL HCL 50 MG PO TABS: ORAL_TABLET | ORAL | Status: DC

## 2013-12-23 NOTE — Telephone Encounter (Signed)
rx ready for pick up

## 2013-12-23 NOTE — Telephone Encounter (Signed)
LM on Vm, scripts ready.

## 2013-12-27 ENCOUNTER — Ambulatory Visit: Payer: Medicare Other | Admitting: Cardiovascular Disease

## 2014-01-07 ENCOUNTER — Ambulatory Visit (INDEPENDENT_AMBULATORY_CARE_PROVIDER_SITE_OTHER): Payer: Medicare Other | Admitting: Cardiology

## 2014-01-07 ENCOUNTER — Encounter: Payer: Self-pay | Admitting: Cardiology

## 2014-01-07 VITALS — BP 132/70 | HR 57 | Ht 62.0 in | Wt 232.8 lb

## 2014-01-07 DIAGNOSIS — E119 Type 2 diabetes mellitus without complications: Secondary | ICD-10-CM

## 2014-01-07 DIAGNOSIS — I10 Essential (primary) hypertension: Secondary | ICD-10-CM

## 2014-01-07 DIAGNOSIS — R5381 Other malaise: Secondary | ICD-10-CM

## 2014-01-07 DIAGNOSIS — R0989 Other specified symptoms and signs involving the circulatory and respiratory systems: Secondary | ICD-10-CM

## 2014-01-07 DIAGNOSIS — R5383 Other fatigue: Secondary | ICD-10-CM

## 2014-01-07 DIAGNOSIS — G4730 Sleep apnea, unspecified: Secondary | ICD-10-CM

## 2014-01-07 DIAGNOSIS — R0609 Other forms of dyspnea: Secondary | ICD-10-CM | POA: Insufficient documentation

## 2014-01-07 DIAGNOSIS — R0600 Dyspnea, unspecified: Secondary | ICD-10-CM | POA: Insufficient documentation

## 2014-01-07 NOTE — Patient Instructions (Signed)
Please have blood work done today - BNP.  Lurena Joiner wants you to follow-up in 6 months with Dr Sallyanne Kuster. You will receive a reminder letter in the mail one months in advance. If you don't receive a letter, please call our office to schedule the follow-up appointment.

## 2014-01-07 NOTE — Assessment & Plan Note (Signed)
BMI 42 

## 2014-01-07 NOTE — Progress Notes (Signed)
01/07/2014 Stacey Summers   1946-02-21  [id number]  Primary Physicia Chevis Pretty, FNP Primary Cardiologist: Dr Sallyanne Kuster  HPI:  68 year old woman who is morbidly obese. Her husband Stacey Summers is also a patient of our practice. She has multiple coronary risk factors that include hypertension diabetes mellitus and hyperlipidemia all of which are being treated with pharmacological means. She complains of feeling tired all the time and becoming easily exhausted. She denies any problems with chest pain although she sometimes has a "strange feeling" in the middle of her chest". She denies any dyspnea at rest but has functional class II dyspnea on exertion. She denies palpitations recently but in the past was prescribed a beta blocker for palpitations, which she still takes. She has problems with ankle edema that is sometimes severe. Although it is always better early in the morning, it never resolves completely. She is seen in the office today mainly because  of persistent dyspnea.     Current Outpatient Prescriptions  Medication Sig Dispense Refill  . ALPRAZolam (XANAX) 0.25 MG tablet Take 1 tablet (0.25 mg total) by mouth 2 (two) times daily as needed.  30 tablet  0  . aspirin 81 MG tablet Take 81 mg by mouth as needed for pain.      Marland Kitchen atorvastatin (LIPITOR) 40 MG tablet Take 1 tablet (40 mg total) by mouth daily.  90 tablet  1  . benazepril (LOTENSIN) 40 MG tablet Take 1 tablet (40 mg total) by mouth daily.  90 tablet  3  . Blood Glucose Monitoring Suppl (LIBERTY BLOOD GLUCOSE MONITOR) DEVI 1 each by Does not apply route daily. Test 1x per day and prn-dx250.02  1 each  0  . cholecalciferol (VITAMIN D) 1000 UNITS tablet Take 1,000 Units by mouth 2 (two) times daily.      . cyclobenzaprine (FLEXERIL) 5 MG tablet Take 1 tablet (5 mg total) by mouth 3 (three) times daily as needed for muscle spasms.  30 tablet  1  . esomeprazole (NEXIUM) 40 MG capsule Take 1 capsule (40 mg total) by mouth  daily before breakfast.  90 capsule  6  . furosemide (LASIX) 40 MG tablet 1 po qam and 1/2 tablet po at 1 in afternoon  135 tablet  1  . glucose blood test strip Test 1x per day and prn- dx 250.02  100 each  12  . metoprolol tartrate (LOPRESSOR) 25 MG tablet Take 1 tablet (25 mg total) by mouth 2 (two) times daily.  180 tablet  1  . ONETOUCH DELICA LANCETS 19Q MISC 1 each by Does not apply route daily. Test 1X per day and prn-- dx 250.02  100 each  11  . traMADol (ULTRAM) 50 MG tablet TAKE ONE TABLET BY MOUTH AS NEEDED FOR PAIN  30 tablet  0   No current facility-administered medications for this visit.    No Known Allergies  History   Social History  . Marital Status: Married    Spouse Name: N/A    Number of Children: 3  . Years of Education: N/A   Occupational History  . Not on file.   Social History Main Topics  . Smoking status: Former Smoker    Types: Cigarettes    Quit date: 08/15/1969  . Smokeless tobacco: Never Used  . Alcohol Use: No  . Drug Use: No  . Sexual Activity: Not on file   Other Topics Concern  . Not on file   Social History Narrative  . No  narrative on file     Review of Systems: General: negative for chills, fever, night sweats or weight changes.  Cardiovascular: negative for chest pain, orthopnea, palpitations, paroxysmal nocturnal dyspnea  Dermatological: negative for rash Respiratory: negative for cough or wheezing Urologic: negative for hematuria Abdominal: negative for nausea, vomiting, diarrhea, bright red blood per rectum, melena, or hematemesis Neurologic: negative for visual changes, syncope, or dizziness All other systems reviewed and are otherwise negative except as noted above.    Blood pressure 132/70, pulse 57, height 5' 2"  (1.575 m), weight 232 lb 12.8 oz (105.597 kg).  General appearance: alert, cooperative, no distress and morbidly obese Lungs: clear to auscultation bilaterally and kyphosis Heart: regular rate and  rhythm Extremities: chronic venous changes, 1+ edema  EKG NSR SB  ASSESSMENT AND PLAN:   Dyspnea on exertion This is her main complaint, it is chronic.   Fatigue Last TSH WNL  DM2 (diabetes mellitus, type 2) Diet controlled  Hypertension Controlled  Sleep apnea- on c pap .  Morbid obesity BMI 42   PLAN  I ambulated her in the clinic thinking she may have chronotropic incompetence but her HR went up to 80 with ambulation. Her O2 sat went down to 90 with ambulation but quickly came up to 95% on room air.  I suggested we get a BNP- if this is negative she should probably pursue PFTs. She did factory work for years and has had a history of prior "asthma".  Doreene Burke [facility] 01/07/2014 5:01 PM

## 2014-01-07 NOTE — Assessment & Plan Note (Signed)
This is her main complaint, it is chronic.

## 2014-01-07 NOTE — Assessment & Plan Note (Signed)
Controlled.  

## 2014-01-07 NOTE — Assessment & Plan Note (Signed)
Diet controlled.  

## 2014-01-07 NOTE — Assessment & Plan Note (Signed)
Last TSH WNL

## 2014-01-07 NOTE — Assessment & Plan Note (Addendum)
Reports complaince

## 2014-01-08 LAB — PRO B NATRIURETIC PEPTIDE: Pro B Natriuretic peptide (BNP): 74.63 pg/mL (ref <126)

## 2014-01-14 ENCOUNTER — Other Ambulatory Visit: Payer: Self-pay | Admitting: Cardiology

## 2014-01-14 DIAGNOSIS — R0609 Other forms of dyspnea: Principal | ICD-10-CM

## 2014-01-14 DIAGNOSIS — E119 Type 2 diabetes mellitus without complications: Secondary | ICD-10-CM

## 2014-01-14 DIAGNOSIS — E785 Hyperlipidemia, unspecified: Secondary | ICD-10-CM

## 2014-01-14 DIAGNOSIS — R0600 Dyspnea, unspecified: Secondary | ICD-10-CM

## 2014-01-14 DIAGNOSIS — Z8489 Family history of other specified conditions: Secondary | ICD-10-CM

## 2014-01-14 DIAGNOSIS — I10 Essential (primary) hypertension: Secondary | ICD-10-CM

## 2014-01-21 ENCOUNTER — Telehealth (HOSPITAL_COMMUNITY): Payer: Self-pay | Admitting: *Deleted

## 2014-01-21 ENCOUNTER — Telehealth: Payer: Self-pay | Admitting: *Deleted

## 2014-01-21 NOTE — Telephone Encounter (Signed)
Pt was contacted to schedule the stress test that was ordered by Kerin Ransom. Pt states that she does not feel comfortable having the stress test because of her last experience. She would like to know if there is an alternative test that can be done.  Please advise

## 2014-01-21 NOTE — Telephone Encounter (Signed)
That's fine if pt doesn't want Myoview. She does not need a cath. It was on the small chance that her DOE was an anginal equivalent. If she wants to pursue this in the future we can do it.  Kerin Ransom PA-C 01/21/2014 5:22 PM

## 2014-01-21 NOTE — Telephone Encounter (Signed)
Luke placed the order for a Lexiscan although it is not in the dictation.

## 2014-01-21 NOTE — Telephone Encounter (Signed)
Will send to Dr. Loletha Grayer and find out if there is another test he can order.

## 2014-01-21 NOTE — Telephone Encounter (Signed)
Stacey Summers needs to answer this question - not sure if he intended to order the test

## 2014-01-21 NOTE — Telephone Encounter (Signed)
I don't see any mention of stress test in Luke's plan. I do see possibly getting PFTs. Can you please calrify which test was intended?

## 2014-01-21 NOTE — Telephone Encounter (Signed)
Luke please review this patient's chart.  A Lexiscan was ordered but she doesn't want this done due to a past problem during a nuclear stress test.  Sent to Dr. Loletha Grayer but he wants you to address since you saw her last and there is no mention of this test being ordered.  Please advise.

## 2014-01-21 NOTE — Telephone Encounter (Signed)
States she became very SOB with the last stress test she had done and is afraid to do it.  Per Lurena Joiner we can wait and if she decides in the future she wants it done we can reorder.  This was being done on the off her DOE is heart related.  Patient voiced understanding and will call if she changes her mind.

## 2014-01-29 ENCOUNTER — Ambulatory Visit (INDEPENDENT_AMBULATORY_CARE_PROVIDER_SITE_OTHER): Payer: Medicare Other | Admitting: Nurse Practitioner

## 2014-01-29 ENCOUNTER — Encounter: Payer: Self-pay | Admitting: Nurse Practitioner

## 2014-01-29 ENCOUNTER — Other Ambulatory Visit: Payer: Self-pay | Admitting: Nurse Practitioner

## 2014-01-29 VITALS — BP 134/82 | HR 58 | Temp 97.8°F | Ht 62.0 in | Wt 235.0 lb

## 2014-01-29 DIAGNOSIS — M79641 Pain in right hand: Secondary | ICD-10-CM

## 2014-01-29 DIAGNOSIS — E785 Hyperlipidemia, unspecified: Secondary | ICD-10-CM

## 2014-01-29 DIAGNOSIS — F411 Generalized anxiety disorder: Secondary | ICD-10-CM

## 2014-01-29 DIAGNOSIS — I10 Essential (primary) hypertension: Secondary | ICD-10-CM

## 2014-01-29 DIAGNOSIS — R600 Localized edema: Secondary | ICD-10-CM

## 2014-01-29 DIAGNOSIS — IMO0001 Reserved for inherently not codable concepts without codable children: Secondary | ICD-10-CM

## 2014-01-29 DIAGNOSIS — M79642 Pain in left hand: Secondary | ICD-10-CM

## 2014-01-29 DIAGNOSIS — M797 Fibromyalgia: Secondary | ICD-10-CM

## 2014-01-29 DIAGNOSIS — M79609 Pain in unspecified limb: Secondary | ICD-10-CM

## 2014-01-29 DIAGNOSIS — M810 Age-related osteoporosis without current pathological fracture: Secondary | ICD-10-CM

## 2014-01-29 DIAGNOSIS — E119 Type 2 diabetes mellitus without complications: Secondary | ICD-10-CM

## 2014-01-29 DIAGNOSIS — R609 Edema, unspecified: Secondary | ICD-10-CM

## 2014-01-29 DIAGNOSIS — J449 Chronic obstructive pulmonary disease, unspecified: Secondary | ICD-10-CM

## 2014-01-29 LAB — POCT GLYCOSYLATED HEMOGLOBIN (HGB A1C): HEMOGLOBIN A1C: 6.6

## 2014-01-29 LAB — LIPID PANEL: LDL CALC: 57 mg/dL

## 2014-01-29 MED ORDER — FLUTICASONE-SALMETEROL 100-50 MCG/DOSE IN AEPB: 1.0000 | INHALATION_SPRAY | Freq: Two times a day (BID) | RESPIRATORY_TRACT | Status: DC

## 2014-01-29 MED ORDER — METOLAZONE 2.5 MG PO TABS: ORAL_TABLET | ORAL | Status: DC

## 2014-01-29 MED ORDER — V-2 HIGH COMPRESSION HOSE MISC: 1.0000 | Freq: Every day | Status: DC

## 2014-01-29 NOTE — Progress Notes (Signed)
Subjective:    Patient ID: Stacey Summers, female    DOB: 03/27/1946, 68 y.o.   MRN: [id number]  Patient here today for follow up of chronic medical problems.   Hypertension This is a chronic problem. The current episode started more than 1 year ago. The problem is unchanged. The problem is controlled. Associated symptoms include peripheral edema. Pertinent negatives include no headaches, malaise/fatigue, orthopnea, palpitations, shortness of breath or sweats. Risk factors for coronary artery disease include diabetes mellitus, dyslipidemia and obesity. Past treatments include ACE inhibitors and beta blockers. Compliance problems include diet and exercise.   Hyperlipidemia This is a chronic problem. The current episode started more than 1 year ago. The problem is uncontrolled. Recent lipid tests were reviewed and are high. Exacerbating diseases include diabetes and obesity. Pertinent negatives include no shortness of breath. Current antihyperlipidemic treatment includes statins. The current treatment provides moderate improvement of lipids. Compliance problems include adherence to diet and adherence to exercise.  Risk factors for coronary artery disease include dyslipidemia, diabetes mellitus, obesity and post-menopausal.  Diabetes She presents for her follow-up diabetic visit. She has type 2 diabetes mellitus. No MedicAlert identification noted. Her disease course has been fluctuating. Pertinent negatives for hypoglycemia include no headaches or sweats. Pertinent negatives for diabetes include no polydipsia, no polyphagia, no polyuria, no visual change and no weight loss. Current diabetic treatment includes diet. She is compliant with treatment some of the time. Her weight is stable. When asked about meal planning, she reported none. She has not had a previous visit with a dietician. She rarely participates in exercise. Her breakfast blood glucose is taken between 9-10 am. Her breakfast blood glucose  range is generally 130-140 mg/dl. (Check very often) An ACE inhibitor/angiotensin II receptor blocker is being taken. She does not see a podiatrist.Eye exam is not current.  Fibromyalgia Taking ultram and flexeril that makes pain tolerable- she has good days and bad days GAD Xanax works well but has to take BID GERD Currently on nexium- keeps symptoms under control. Peripheral edema Has gotten worse since her last colonoscopy- takes lasix 40 mg 1/2 in Am and 1 po qhs- Patient saw cardiologist last week for SOB and swelling- he said her heart was okay.  * SOB- use to use inhalers but she stopped using * Patient c/o bil hand pain - has been going on for awhile- has seen ortho and had injection sin the past. Review of Systems  Constitutional: Negative for weight loss and malaise/fatigue.  Respiratory: Negative for shortness of breath.   Cardiovascular: Negative for palpitations and orthopnea.  Endocrine: Negative for polydipsia, polyphagia and polyuria.  Neurological: Negative for headaches.       Objective:   Physical Exam  Constitutional: She is oriented to person, place, and time. She appears well-developed and well-nourished.  HENT:  Nose: Nose normal.  Mouth/Throat: Oropharynx is clear and moist.  Eyes: EOM are normal.  Neck: Trachea normal, normal range of motion and full passive range of motion without pain. Neck supple. No JVD present. Carotid bruit is not present. No thyromegaly present.  Cardiovascular: Normal rate, regular rhythm, normal heart sounds and intact distal pulses.  Exam reveals no gallop and no friction rub.   No murmur heard. Pulmonary/Chest: Effort normal and breath sounds normal.  Abdominal: Soft. Bowel sounds are normal. She exhibits no distension and no mass. There is no tenderness.  Musculoskeletal: Normal range of motion. She exhibits edema (2+ bilaterally).  Lymphadenopathy:    She has no  cervical adenopathy.  Neurological: She is alert and oriented to  person, place, and time. She has normal reflexes.  Multiple areas of tenderness up and down back  Skin: Skin is warm and dry.  Psychiatric: She has a normal mood and affect. Her behavior is normal. Judgment and thought content normal.   BP 134/82  Pulse 58  Temp(Src) 97.8 F (36.6 C) (Oral)  Ht _0  (1.575 m)  Wt 235 lb (106.595 kg)  BMI 42.97 kg/m2  Results for orders placed in visit on 01/29/14  POCT GLYCOSYLATED HEMOGLOBIN (HGB A1C)      Result Value Ref Range   Hemoglobin A1C 6.6           Assessment & Plan:   1. Hypertension   2. DM2 (diabetes mellitus, type 2)   3. Hyperlipidemia   4. GAD (generalized anxiety disorder)   5. Fibromyalgia muscle pain   6. Bilateral leg edema   7. Morbid obesity   8. COPD (chronic obstructive pulmonary disease)   9. Bilateral hand pain    Orders Placed This Encounter  Procedures  . CMP14+EGFR  . NMR, lipoprofile  . Ambulatory referral to Orthopedic Surgery    Referral Priority:  Routine    Referral Type:  Surgical    Referral Reason:  Specialty Services Required    Requested Specialty:  Orthopedic Surgery    Number of Visits Requested:  1  . POCT glycosylated hemoglobin (Hb A1C)   Meds ordered this encounter  Medications  . Elastic Bandages & Supports (V-2 HIGH COMPRESSION HOSE) MISC    Sig: 1 each by Does not apply route daily. 20-30lbs    Dispense:  1 each    Refill:  0    Order Specific Question:  Supervising Provider    Answer:  Chipper Herb [[id number]]  . metolazone (ZAROXOLYN) 2.5 MG tablet    Sig: 1 po 2x a week as needed    Dispense:  10 tablet    Refill:  1    Order Specific Question:  Supervising Provider    Answer:  Chipper Herb [[id number]]  . Fluticasone-Salmeterol (ADVAIR) 100-50 MCG/DOSE AEPB    Sig: Inhale 1 puff into the lungs 2 (two) times daily.    Dispense:  3 each    Refill:  3    Order Specific Question:  Supervising Provider    Answer:  Joycelyn Man   Added zaroxyln to meds- only take  as needed 1-2x a week-wear compression hose daily Encouraged to get stress test as recommended by cardiologist Referral to ortho for hand pain STarted back on advair for SOB Labs pending Health maintenance reviewed Diet and exercise encouraged Continue all meds Follow up  In 3 months   [place], FNP

## 2014-01-29 NOTE — Patient Instructions (Signed)
Peripheral Edema °You have swelling in your legs (peripheral edema). This swelling is due to excess accumulation of salt and water in your body. Edema may be a sign of heart, kidney or liver disease, or a side effect of a medication. It may also be due to problems in the leg veins. Elevating your legs and using special support stockings may be very helpful, if the cause of the swelling is due to poor venous circulation. Avoid long periods of standing, whatever the cause. °Treatment of edema depends on identifying the cause. Chips, pretzels, pickles and other salty foods should be avoided. Restricting salt in your diet is almost always needed. Water pills (diuretics) are often used to remove the excess salt and water from your body via urine. These medicines prevent the kidney from reabsorbing sodium. This increases urine flow. °Diuretic treatment may also result in lowering of potassium levels in your body. Potassium supplements may be needed if you have to use diuretics daily. Daily weights can help you keep track of your progress in clearing your edema. You should call your caregiver for follow up care as recommended. °SEEK IMMEDIATE MEDICAL CARE IF:  °· You have increased swelling, pain, redness, or heat in your legs. °· You develop shortness of breath, especially when lying down. °· You develop chest or abdominal pain, weakness, or fainting. °· You have a fever. °Document Released: 09/08/2004 Document Revised: 10/24/2011 Document Reviewed: 08/19/2009 °Exit[employer]® Patient Information ©2015 [employer]. This information is not intended to replace advice given to you by your health care provider. Make sure you discuss any questions you have with your health care provider. ° °

## 2014-01-30 LAB — CMP14+EGFR
A/G RATIO: 1.8 (ref 1.1–2.5)
ALT: 26 IU/L (ref 0–32)
AST: 35 IU/L (ref 0–40)
Albumin: 3.8 g/dL (ref 3.6–4.8)
Alkaline Phosphatase: 94 IU/L (ref 39–117)
BUN/Creatinine Ratio: 26 (ref 11–26)
BUN: 18 mg/dL (ref 8–27)
CALCIUM: 9.1 mg/dL (ref 8.7–10.3)
CO2: 27 mmol/L (ref 18–29)
Chloride: 102 mmol/L (ref 97–108)
Creatinine, Ser: 0.68 mg/dL (ref 0.57–1.00)
GFR calc Af Amer: 106 mL/min/{1.73_m2} (ref >59)
GFR, EST NON AFRICAN AMERICAN: 92 mL/min/{1.73_m2} (ref >59)
GLUCOSE: 166 mg/dL — AB (ref 65–99)
Globulin, Total: 2.1 g/dL (ref 1.5–4.5)
POTASSIUM: 4.8 mmol/L (ref 3.5–5.2)
Sodium: 143 mmol/L (ref 134–144)
TOTAL PROTEIN: 5.9 g/dL — AB (ref 6.0–8.5)
Total Bilirubin: 1.6 mg/dL — ABNORMAL HIGH (ref 0.0–1.2)

## 2014-01-30 LAB — NMR, LIPOPROFILE
Cholesterol: 110 mg/dL (ref 100–199)
HDL CHOLESTEROL BY NMR: 39 mg/dL — AB (ref >39)
HDL Particle Number: 23.7 umol/L — ABNORMAL LOW (ref >=30.5)
LDL Particle Number: 814 nmol/L (ref <1000)
LDL Size: 21.2 nm (ref >20.5)
LDLC SERPL CALC-MCNC: 59 mg/dL (ref 0–99)
LP-IR Score: 40 (ref <=45)
Small LDL Particle Number: 394 nmol/L (ref <=527)
TRIGLYCERIDES BY NMR: 62 mg/dL (ref 0–149)

## 2014-02-03 ENCOUNTER — Ambulatory Visit (INDEPENDENT_AMBULATORY_CARE_PROVIDER_SITE_OTHER): Payer: Medicare Other

## 2014-02-03 ENCOUNTER — Ambulatory Visit (INDEPENDENT_AMBULATORY_CARE_PROVIDER_SITE_OTHER): Payer: Medicare Other | Admitting: Family Medicine

## 2014-02-03 ENCOUNTER — Telehealth: Payer: Self-pay | Admitting: Family Medicine

## 2014-02-03 ENCOUNTER — Encounter: Payer: Self-pay | Admitting: Family Medicine

## 2014-02-03 VITALS — BP 147/81 | HR 67 | Temp 97.9°F | Ht 62.0 in | Wt 231.0 lb

## 2014-02-03 DIAGNOSIS — S3992XA Unspecified injury of lower back, initial encounter: Secondary | ICD-10-CM

## 2014-02-03 DIAGNOSIS — T1490XA Injury, unspecified, initial encounter: Secondary | ICD-10-CM

## 2014-02-03 DIAGNOSIS — IMO0002 Reserved for concepts with insufficient information to code with codable children: Secondary | ICD-10-CM

## 2014-02-03 MED ORDER — TRAMADOL HCL 50 MG PO TABS: ORAL_TABLET | ORAL | Status: DC

## 2014-02-03 NOTE — Progress Notes (Signed)
   Subjective:    Patient ID: Stacey Summers, female    DOB: Sep 13, 1945, 68 y.o.   MRN: [id number]  HPI  This 68 y.o. female presents for evaluation of tailbone pain after falling on her tailbone a few days ago.  Review of Systems C/o tailbone pain   No chest pain, SOB, HA, dizziness, vision change, N/V, diarrhea, constipation, dysuria, urinary urgency or frequency or rash.  Objective:   Physical Exam   Vital signs noted  Well developed well nourished obese female.  HEENT - Head atraumatic Normocephalic                Eyes - PERRLA, Conjuctiva - clear Sclera- Clear EOMI                Ears - EAC's Wnl TM's Wnl Gross Hearing WNL                Throat - oropharanx wnl Respiratory - Lungs CTA bilateral Cardiac - RRR S1 and S2 without murmur GI - Abdomen soft Nontender and bowel sounds active x 4 Extremities - No edema. Neuro - Grossly intact. MS - TTP coccyx  Sacrum / coccyx xray - No fracture Prelimnary reading by Iverson Alamin    Assessment & Plan:  Injury - Plan: DG Sacrum/Coccyx - Explained no fx.  Sciatic doughnut/ pillow rx given and explained to sit on this and she will not hurt as much  Tailbone injury, initial encounter - Plan: traMADol (ULTRAM) 50 MG tablet  Follow up if not better.  Lysbeth Penner FNP

## 2014-02-03 NOTE — Telephone Encounter (Signed)
Patient needs to be seen she fell on vacation she needs an Xray

## 2014-02-04 ENCOUNTER — Other Ambulatory Visit: Payer: Self-pay | Admitting: *Deleted

## 2014-02-04 MED ORDER — METOPROLOL TARTRATE 25 MG PO TABS: 25.0000 mg | ORAL_TABLET | Freq: Two times a day (BID) | ORAL | Status: DC

## 2014-02-04 NOTE — Telephone Encounter (Signed)
Rx refill sent to patient pharmacy electronically

## 2014-02-05 ENCOUNTER — Other Ambulatory Visit: Payer: Self-pay | Admitting: Family Medicine

## 2014-02-05 ENCOUNTER — Other Ambulatory Visit: Payer: Self-pay | Admitting: *Deleted

## 2014-02-05 MED ORDER — ATORVASTATIN CALCIUM 40 MG PO TABS: 40.0000 mg | ORAL_TABLET | Freq: Every day | ORAL | Status: DC

## 2014-03-04 ENCOUNTER — Other Ambulatory Visit: Payer: Self-pay | Admitting: *Deleted

## 2014-03-04 MED ORDER — FUROSEMIDE 40 MG PO TABS
ORAL_TABLET | ORAL | Status: DC
Start: 2014-03-04 — End: 2014-09-22

## 2014-04-23 ENCOUNTER — Other Ambulatory Visit: Payer: Medicare Other

## 2014-04-23 ENCOUNTER — Telehealth: Payer: Self-pay | Admitting: Nurse Practitioner

## 2014-04-23 ENCOUNTER — Ambulatory Visit: Payer: Medicare Other

## 2014-04-24 ENCOUNTER — Telehealth: Payer: Self-pay | Admitting: Family Medicine

## 2014-04-24 NOTE — Telephone Encounter (Signed)
Mailed letter with new appointment date/time

## 2014-04-25 MED ORDER — GLUCOSE BLOOD VI STRP: ORAL_STRIP | Status: DC

## 2014-04-25 NOTE — Telephone Encounter (Signed)
done

## 2014-05-28 ENCOUNTER — Ambulatory Visit (INDEPENDENT_AMBULATORY_CARE_PROVIDER_SITE_OTHER): Payer: Medicare Other

## 2014-05-28 ENCOUNTER — Encounter: Payer: Self-pay | Admitting: Pharmacist

## 2014-05-28 ENCOUNTER — Encounter (INDEPENDENT_AMBULATORY_CARE_PROVIDER_SITE_OTHER): Payer: Self-pay

## 2014-05-28 ENCOUNTER — Ambulatory Visit (INDEPENDENT_AMBULATORY_CARE_PROVIDER_SITE_OTHER): Payer: Medicare Other | Admitting: Pharmacist

## 2014-05-28 VITALS — Ht 61.0 in | Wt 227.0 lb

## 2014-05-28 DIAGNOSIS — M810 Age-related osteoporosis without current pathological fracture: Secondary | ICD-10-CM

## 2014-05-28 DIAGNOSIS — Z23 Encounter for immunization: Secondary | ICD-10-CM

## 2014-05-28 DIAGNOSIS — R7989 Other specified abnormal findings of blood chemistry: Secondary | ICD-10-CM

## 2014-05-28 DIAGNOSIS — M8580 Other specified disorders of bone density and structure, unspecified site: Secondary | ICD-10-CM

## 2014-05-28 LAB — HM DEXA SCAN

## 2014-05-28 NOTE — Patient Instructions (Signed)

## 2014-05-28 NOTE — Progress Notes (Signed)
Patient ID: NATALEA SUTLIFF, female   DOB: 1945/11/05, 68 y.o.   MRN: [id number] [facility] Current Height: Height: 5' 1"  (154.9 cm)      Max Lifetime Height:  5' 2"  Current Weight: Weight: 227 lb (102.967 kg)       Ethnicity:Caucasian    HPI: Does pt already have a diagnosis of:  Osteopenia?  No Osteoporosis?  No  Back Pain?  Yes       Kyphosis?  No Prior fracture?  No Med(s) for Osteoporosis/Osteopenia:  none Med(s) previously tried for Osteoporosis/Osteopenia:  none                                                             PMH: Age at menopause:  25's Hysterectomy?  No Oophorectomy?  No HRT? No Steroid Use?  No Thyroid med?  No History of cancer?  No - but history of fatty tumor in abdomen History of digestive disorders (ie Crohn's)?  Yes - GERD on chronic PPI; colon polyps - precancerous Current or previous eating disorders?  No Last Vitamin D Result:  27 (02/2012) Last GFR Result:  92 (01/2014)   FH/SH: Family history of osteoporosis?  No Parent with history of hip fracture?  No Family history of breast cancer?  No Exercise?  No Smoking?  No Alcohol?  No    Calcium Assessment Calcium Intake  # of servings/day  Calcium mg  Milk (8 oz) 0.5  x  300  = 172m  Yogurt (4 oz) 0 x  200 = 0  Cheese (1 oz) 1 x  200 = 2072m Other Calcium sources   25069mCa supplement 0 = 0   Estimated calcium intake per day 600m32m DEXA Results Date of Test T-Score for AP Spine L1-L4 T-Score for Total Left Hip T-Score for Total Right Hip  05/28/2014 0.5 -0.2 -0.4  07/08/2009 -0.1 -0.3 -0.4  10/02/2006 -0.2 -0.4 -0.4       **T-score for neck of left hip = -1.3 **T-score for neck of right hip = -1.2  FRAX 10 year estimate: Total FX risk:  7.5%  (consider medication if >/= 20%) Hip FX risk:  0.7%  (consider medication if >/= 3%)  Assessment: Osteopenia with low 10 year fracture risk per FRAX estimate History of low vitamin D in 2013 - due  recheck  Recommendations: 1.  Discussed results of BMD and fracture risk 2.  recommend calcium 1200mg32mly through supplementation or diet.  3.  recommend weight bearing exercise - 30 minutes at least 4 days per week.   4.  Counseled and educated about fall risk and prevention. 5.   Orders Placed This Encounter  Procedures  . Vit D  25 hydroxy (rtn osteoporosis monitoring)   6.   Influenza vaccine given in office today   Recheck DEXA:  2 years  Time spent counseling patient:  30 minutes   TammyCherre RobinsrmD, CPP

## 2014-05-29 LAB — VITAMIN D 25 HYDROXY (VIT D DEFICIENCY, FRACTURES): Vit D, 25-Hydroxy: 39.1 ng/mL (ref 30.0–100.0)

## 2014-06-02 ENCOUNTER — Telehealth: Payer: Self-pay | Admitting: *Deleted

## 2014-06-02 NOTE — Telephone Encounter (Signed)
Aware,vitamin D normal.

## 2014-06-09 ENCOUNTER — Telehealth: Payer: Self-pay | Admitting: Nurse Practitioner

## 2014-06-09 DIAGNOSIS — S3992XA Unspecified injury of lower back, initial encounter: Secondary | ICD-10-CM

## 2014-06-09 MED ORDER — TRAMADOL HCL 50 MG PO TABS: ORAL_TABLET | ORAL | Status: DC

## 2014-06-09 MED ORDER — ALPRAZOLAM 0.25 MG PO TABS: 0.2500 mg | ORAL_TABLET | Freq: Two times a day (BID) | ORAL | Status: DC | PRN

## 2014-06-09 NOTE — Telephone Encounter (Signed)
rx ready for pick up

## 2014-06-09 NOTE — Telephone Encounter (Signed)
Patient picked up in night clinic

## 2014-06-16 ENCOUNTER — Other Ambulatory Visit: Payer: Self-pay | Admitting: *Deleted

## 2014-06-16 MED ORDER — BENAZEPRIL HCL 40 MG PO TABS: 40.0000 mg | ORAL_TABLET | Freq: Every day | ORAL | Status: DC

## 2014-06-16 NOTE — Telephone Encounter (Signed)
Refilled electronically

## 2014-06-17 ENCOUNTER — Other Ambulatory Visit: Payer: Self-pay

## 2014-06-17 MED ORDER — ATORVASTATIN CALCIUM 40 MG PO TABS: 40.0000 mg | ORAL_TABLET | Freq: Every day | ORAL | Status: DC

## 2014-06-17 NOTE — Telephone Encounter (Signed)
Last seen and last lipid 6/15  [facility] [place]  Requesting 90 day supply

## 2014-07-04 ENCOUNTER — Ambulatory Visit: Payer: Self-pay

## 2014-07-14 ENCOUNTER — Encounter: Payer: Self-pay | Admitting: Cardiovascular Disease

## 2014-07-14 ENCOUNTER — Ambulatory Visit (INDEPENDENT_AMBULATORY_CARE_PROVIDER_SITE_OTHER): Payer: Medicare Other | Admitting: Cardiovascular Disease

## 2014-07-14 VITALS — BP 141/77 | HR 51 | Resp 16 | Ht 61.0 in | Wt 229.5 lb

## 2014-07-14 DIAGNOSIS — R5383 Other fatigue: Secondary | ICD-10-CM

## 2014-07-14 DIAGNOSIS — E785 Hyperlipidemia, unspecified: Secondary | ICD-10-CM

## 2014-07-14 DIAGNOSIS — R0602 Shortness of breath: Secondary | ICD-10-CM

## 2014-07-14 DIAGNOSIS — R0609 Other forms of dyspnea: Secondary | ICD-10-CM

## 2014-07-14 DIAGNOSIS — R600 Localized edema: Secondary | ICD-10-CM

## 2014-07-14 DIAGNOSIS — Z8489 Family history of other specified conditions: Secondary | ICD-10-CM

## 2014-07-14 MED ORDER — METOPROLOL TARTRATE 25 MG PO TABS: 12.5000 mg | ORAL_TABLET | Freq: Two times a day (BID) | ORAL | Status: DC

## 2014-07-14 NOTE — Patient Instructions (Signed)
DECREASE Metoprolol to 12.85m twice a day (1/2 tablet).  Dr. CSallyanne Kusterrecommends that you schedule a follow-up appointment in: One year.

## 2014-07-15 ENCOUNTER — Encounter: Payer: Self-pay | Admitting: Cardiovascular Disease

## 2014-07-15 NOTE — Progress Notes (Signed)
Patient ID: Stacey Summers, female   DOB: 1946/05/19, 68 y.o.   MRN: [id number]     Reason for office visit Fatigue  Stacey Summers returns for followup and complains of fatigue, occasional dyspnea, but no syncope or exertional chest pain. Most of her complaints are musculoskeletal (carpal tunnel, back, both knees). She was diagnosed with obstructive sleep apnea and began treatment with CPAP last year . She continues to have bilateral ankle swelling, as before more prominent on the left than on the right side. She had a virtually normal echocardiogram with the exception of mild left atrial dilatation. Left ventricular systolic and diastolic function were both normal. There was no clear evidence of pulmonary hypertension. She has treated systemic hypertension and is morbidly obese.  She is markedly bradycardic.   No Known Allergies  Current Outpatient Prescriptions  Medication Sig Dispense Refill  . ALPRAZolam (XANAX) 0.25 MG tablet Take 1 tablet (0.25 mg total) by mouth 2 (two) times daily as needed. 30 tablet 0  . aspirin 81 MG tablet Take 81 mg by mouth as needed for pain.    Marland Kitchen atorvastatin (LIPITOR) 40 MG tablet Take 1 tablet (40 mg total) by mouth daily. 90 tablet 0  . benazepril (LOTENSIN) 40 MG tablet Take 1 tablet (40 mg total) by mouth daily. 90 tablet 0  . Blood Glucose Monitoring Suppl (LIBERTY BLOOD GLUCOSE MONITOR) DEVI 1 each by Does not apply route daily. Test 1x per day and prn-dx250.02 1 each 0  . cholecalciferol (VITAMIN D) 1000 UNITS tablet Take 1,000 Units by mouth daily.     . cyclobenzaprine (FLEXERIL) 5 MG tablet Take 1 tablet (5 mg total) by mouth 3 (three) times daily as needed for muscle spasms. 30 tablet 1  . Elastic Bandages & Supports (V-2 HIGH COMPRESSION HOSE) MISC 1 each by Does not apply route daily. 20-30lbs 1 each 0  . esomeprazole (NEXIUM) 40 MG capsule Take 1 capsule (40 mg total) by mouth daily before breakfast. 90 capsule 6  . Fluticasone-Salmeterol (ADVAIR)  100-50 MCG/DOSE AEPB Inhale 1 puff into the lungs 2 (two) times daily. 3 each 3  . furosemide (LASIX) 40 MG tablet 1 po qam and 1/2 tablet po at 1 in afternoon 135 tablet 1  . glucose blood test strip Test 1x per day and prn- dx 250.02 100 each 2  . metolazone (ZAROXOLYN) 2.5 MG tablet 1 po 2x a week as needed 10 tablet 1  . metoprolol tartrate (LOPRESSOR) 25 MG tablet Take 0.5 tablets (12.5 mg total) by mouth 2 (two) times daily. 90 tablet 3  . ONETOUCH DELICA LANCETS 53Z MISC 1 each by Does not apply route daily. Test 1X per day and prn-- dx 250.02 100 each 11  . traMADol (ULTRAM) 50 MG tablet TAKE ONE TABLET BY MOUTH AS NEEDED FOR PAIN 30 tablet 0   No current facility-administered medications for this visit.    Past Medical History  Diagnosis Date  . Asthma     bronchitis  . Allergy     seasonal  . Arthritis   . GERD (gastroesophageal reflux disease)   . Hyperlipidemia   . Hypertension   . Tubular adenoma of colon 05/09/12    "innumerable number" of flat polyps  . DJD (degenerative joint disease)   . Anxiety     Past Surgical History  Procedure Laterality Date  . Tonsillectomy  1965  . Cholecystectomy  1990  . Cesarean section  1977  . Tumor removal  benign fatty tumors on abdomen  . Polypectomy      vocal cord    Family History  Problem Relation Age of Onset  . Prostate cancer Father     dx in his late 72s  . Colon cancer Father     dx in his 12s  . Heart failure Mother   . Heart disease Mother   . Other Brother     colon resection  . Colon polyps Brother     multiple large polyps  . Colon cancer Brother 37    colon resection 2010  . Spina bifida Brother     died at birth  . Other Paternal Uncle     colostomy  . Colon cancer Paternal Uncle     in his 35s  . Heart failure Maternal Grandmother   . Heart failure Maternal Uncle   . Stomach cancer Paternal Aunt   . Brain cancer Cousin 19  . Colon cancer Other     fathers cousin  . Kidney cancer  Cousin     maternal cousin  . Other Other 16    multiple lipomas  . Esophageal cancer Neg Hx   . Rectal cancer Neg Hx     History   Social History  . Marital Status: Married    Spouse Name: N/A    Number of Children: 3  . Years of Education: N/A   Occupational History  . Not on file.   Social History Main Topics  . Smoking status: Former Smoker    Types: Cigarettes    Quit date: 08/15/1969  . Smokeless tobacco: Never Used  . Alcohol Use: No  . Drug Use: No  . Sexual Activity: Not on file   Other Topics Concern  . Not on file   Social History Narrative    Review of systems: The patient specifically denies any chest pain at rest or with exertion, dyspnea at rest, orthopnea, paroxysmal nocturnal dyspnea, syncope, palpitations, focal neurological deficits, intermittent claudication, lower extremity edema, unexplained weight gain, cough, hemoptysis or wheezing.  The patient also denies abdominal pain, nausea, vomiting, dysphagia, diarrhea, constipation, polyuria, polydipsia, dysuria, hematuria, frequency, urgency, abnormal bleeding or bruising, fever, chills, unexpected weight changes, mood swings, change in skin or hair texture, change in voice quality, auditory or visual problems, allergic reactions or rashes, new musculoskeletal complaints other than usual "aches and pains".   PHYSICAL EXAM BP 141/77 mmHg  Pulse 51  Resp 16  Ht 5' 1"  (1.549 m)  Wt 229 lb 8 oz (104.101 kg)  BMI 43.39 kg/m2 General: Alert, oriented x3, no distress; morbidly obese  Head: no evidence of trauma, PERRL, EOMI, no exophtalmos or lid lag, no myxedema, no xanthelasma; normal ears, nose and oropharynx  Neck: normal jugular venous pulsations and no hepatojugular reflux; brisk carotid pulses without delay and no carotid bruits  Chest: clear to auscultation, no signs of consolidation by percussion or palpation, normal fremitus, symmetrical and full respiratory excursions  Cardiovascular: normal  position and quality of the apical impulse, regular rhythm, normal first and second heart sounds, grade 2/6 systolic murmur heard best in the aortic focus but with a holosystolic rather than Crescenta decrescendo pattern, rubs or gallops  Abdomen: no tenderness or distention, no masses by palpation, no abnormal pulsatility or arterial bruits, normal bowel sounds, no hepatosplenomegaly  Extremities: no clubbing, cyanosis; of left ankle edema; 2+ radial, ulnar and brachial pulses bilaterally; 2+ right femoral, posterior tibial and dorsalis pedis pulses; 2+ left femoral, posterior tibial  and dorsalis pedis pulses; no subclavian or femoral bruits  Neurological: grossly nonfocal   EKG: Sinus bradycardia, mild delay in anterior wall R wave progression, otherwise normal   Lipid Panel     Component Value Date/Time   CHOL 110 01/29/2014 1031   TRIG 62 01/29/2014 1031   TRIG 83 12/20/2012 1044   HDL 39* 01/29/2014 1031   LDLCALC 59 01/29/2014 1031   LDLCALC 103* 12/20/2012 1044    BMET    Component Value Date/Time   NA 143 01/29/2014 1031   NA 142 12/20/2012 1044   K 4.8 01/29/2014 1031   CL 102 01/29/2014 1031   CO2 27 01/29/2014 1031   GLUCOSE 166* 01/29/2014 1031   GLUCOSE 134* 12/20/2012 1044   BUN 18 01/29/2014 1031   BUN 13 12/20/2012 1044   CREATININE 0.68 01/29/2014 1031   CREATININE 0.67 12/20/2012 1044   CALCIUM 9.1 01/29/2014 1031   GFRNONAA 92 01/29/2014 1031   GFRNONAA >89 12/20/2012 1044   GFRAA 106 01/29/2014 1031   GFRAA >89 12/20/2012 1044     ASSESSMENT AND PLAN  Her fatigue is likely multifactorial, but beta blocker induced bradycardia is a likely component of it. Will reduce the metoprolol dose and may have to stop it altogether. It was being used for palpitations and there is no good alternative that would not cause similar bradycardia. No serious arrhythmia has been detected. May need alternative treatment for BP. Encouraged better compliance with  CPAP.  Orders Placed This Encounter  Procedures  . EKG 12-Lead   Meds ordered this encounter  Medications  . metoprolol tartrate (LOPRESSOR) 25 MG tablet    Sig: Take 0.5 tablets (12.5 mg total) by mouth 2 (two) times daily.    Dispense:  90 tablet    Refill:  [place] Melquiades Kovar, MD, [facility] [phone] office [phone] pager

## 2014-07-16 ENCOUNTER — Ambulatory Visit: Payer: Self-pay

## 2014-07-18 ENCOUNTER — Ambulatory Visit (INDEPENDENT_AMBULATORY_CARE_PROVIDER_SITE_OTHER): Payer: Medicare Other | Admitting: Pharmacist

## 2014-07-18 ENCOUNTER — Encounter: Payer: Self-pay | Admitting: Pharmacist

## 2014-07-18 VITALS — BP 122/64 | HR 58 | Ht 61.0 in | Wt 232.0 lb

## 2014-07-18 DIAGNOSIS — Z0000 Encounter for general adult medical examination without abnormal findings: Secondary | ICD-10-CM

## 2014-07-18 DIAGNOSIS — E119 Type 2 diabetes mellitus without complications: Secondary | ICD-10-CM

## 2014-07-18 DIAGNOSIS — Z79899 Other long term (current) drug therapy: Secondary | ICD-10-CM

## 2014-07-18 MED ORDER — OMEPRAZOLE 40 MG PO CPDR: 40.0000 mg | DELAYED_RELEASE_CAPSULE | Freq: Every day | ORAL | Status: DC

## 2014-07-18 NOTE — Progress Notes (Signed)
Patient ID: Stacey Summers, female   DOB: 1946-05-20, 68 y.o.   MRN: [id number] Subjective:    Stacey Summers is a 68 y.o. female who presents for Medicare Initial Wellness Visit  Preventive Screening-Counseling & Management  Tobacco History  Smoking status  . Former Smoker  . Types: Cigarettes  . Quit date: 08/15/1969  Smokeless tobacco  . Never Used     Current Problems (verified) Patient Active Problem List   Diagnosis Date Noted  . Low serum vitamin D 05/28/2014  . Osteopenia 05/28/2014  . COPD (chronic obstructive pulmonary disease) 01/29/2014  . Dyspnea on exertion 01/07/2014  . Sleep apnea- on c pap 01/07/2014  . Fatigue 03-04-2013  . Bilateral leg edema 2013/03/04  . Murmur, cardiac 03/04/13  . DM2 (diabetes mellitus, type 2) 03/04/13  . Morbid obesity 03/04/2013  . Family history of sudden death 03/04/13  . Hyperlipidemia 2013/03/04  . Hypertension 12/20/2012  . GAD (generalized anxiety disorder) 12/20/2012  . Fibromyalgia muscle pain 12/20/2012    Medications Prior to Visit Current Outpatient Prescriptions on File Prior to Visit  Medication Sig Dispense Refill  . ALPRAZolam (XANAX) 0.25 MG tablet Take 1 tablet (0.25 mg total) by mouth 2 (two) times daily as needed. 30 tablet 0  . aspirin 81 MG tablet Take 81 mg by mouth as needed for pain.    Marland Kitchen atorvastatin (LIPITOR) 40 MG tablet Take 1 tablet (40 mg total) by mouth daily. 90 tablet 0  . benazepril (LOTENSIN) 40 MG tablet Take 1 tablet (40 mg total) by mouth daily. 90 tablet 0  . Blood Glucose Monitoring Suppl (LIBERTY BLOOD GLUCOSE MONITOR) DEVI 1 each by Does not apply route daily. Test 1x per day and prn-dx250.02 1 each 0  . cholecalciferol (VITAMIN D) 1000 UNITS tablet Take 1,000 Units by mouth daily.     . cyclobenzaprine (FLEXERIL) 5 MG tablet Take 1 tablet (5 mg total) by mouth 3 (three) times daily as needed for muscle spasms. 30 tablet 1  . esomeprazole (NEXIUM) 40 MG capsule Take 1 capsule  (40 mg total) by mouth daily before breakfast. 90 capsule 6  . Fluticasone-Salmeterol (ADVAIR) 100-50 MCG/DOSE AEPB Inhale 1 puff into the lungs 2 (two) times daily. 3 each 3  . furosemide (LASIX) 40 MG tablet 1 po qam and 1/2 tablet po at 1 in afternoon 135 tablet 1  . glucose blood test strip Test 1x per day and prn- dx 250.02 100 each 2  . metolazone (ZAROXOLYN) 2.5 MG tablet 1 po 2x a week as needed 10 tablet 1  . metoprolol tartrate (LOPRESSOR) 25 MG tablet Take 0.5 tablets (12.5 mg total) by mouth 2 (two) times daily. 90 tablet 3  . ONETOUCH DELICA LANCETS 96G MISC 1 each by Does not apply route daily. Test 1X per day and prn-- dx 250.02 100 each 11  . traMADol (ULTRAM) 50 MG tablet TAKE ONE TABLET BY MOUTH AS NEEDED FOR PAIN 30 tablet 0  . Elastic Bandages & Supports (V-2 HIGH COMPRESSION HOSE) MISC 1 each by Does not apply route daily. 20-30lbs (Patient not taking: Reported on 07/18/2014) 1 each 0   No current facility-administered medications on file prior to visit.    Current Medications (verified) Current Outpatient Prescriptions  Medication Sig Dispense Refill  . ALPRAZolam (XANAX) 0.25 MG tablet Take 1 tablet (0.25 mg total) by mouth 2 (two) times daily as needed. 30 tablet 0  . aspirin 81 MG tablet Take 81 mg by mouth as needed for  pain.    . atorvastatin (LIPITOR) 40 MG tablet Take 1 tablet (40 mg total) by mouth daily. 90 tablet 0  . benazepril (LOTENSIN) 40 MG tablet Take 1 tablet (40 mg total) by mouth daily. 90 tablet 0  . Blood Glucose Monitoring Suppl (LIBERTY BLOOD GLUCOSE MONITOR) DEVI 1 each by Does not apply route daily. Test 1x per day and prn-dx250.02 1 each 0  . Calcium-Vitamin D-Vitamin K (CALCIUM SOFT CHEWS) 500-100-40 MG-UNT-MCG CHEW Chew 1 each by mouth daily.    . cholecalciferol (VITAMIN D) 1000 UNITS tablet Take 1,000 Units by mouth daily.     . cyclobenzaprine (FLEXERIL) 5 MG tablet Take 1 tablet (5 mg total) by mouth 3 (three) times daily as needed for  muscle spasms. 30 tablet 1  . esomeprazole (NEXIUM) 40 MG capsule Take 1 capsule (40 mg total) by mouth daily before breakfast. 90 capsule 6  . Fluticasone-Salmeterol (ADVAIR) 100-50 MCG/DOSE AEPB Inhale 1 puff into the lungs 2 (two) times daily. 3 each 3  . furosemide (LASIX) 40 MG tablet 1 po qam and 1/2 tablet po at 1 in afternoon 135 tablet 1  . glucose blood test strip Test 1x per day and prn- dx 250.02 100 each 2  . metolazone (ZAROXOLYN) 2.5 MG tablet 1 po 2x a week as needed 10 tablet 1  . metoprolol tartrate (LOPRESSOR) 25 MG tablet Take 0.5 tablets (12.5 mg total) by mouth 2 (two) times daily. 90 tablet 3  . ONETOUCH DELICA LANCETS 75I MISC 1 each by Does not apply route daily. Test 1X per day and prn-- dx 250.02 100 each 11  . traMADol (ULTRAM) 50 MG tablet TAKE ONE TABLET BY MOUTH AS NEEDED FOR PAIN 30 tablet 0  . Elastic Bandages & Supports (V-2 HIGH COMPRESSION HOSE) MISC 1 each by Does not apply route daily. 20-30lbs (Patient not taking: Reported on 07/18/2014) 1 each 0   No current facility-administered medications for this visit.    Medication Problems - Patient states that she is not taking Nexium regularly due to cost - asks if there might be a cheaper alternative.   Allergies (verified) Review of patient's allergies indicates no known allergies.   PAST HISTORY  Family History Family History  Problem Relation Age of Onset  . Prostate cancer Father     dx in his late 58s  . Colon cancer Father     dx in his 69s  . Cancer Father   . Heart attack Father 52  . Heart failure Mother   . Heart disease Mother   . Other Brother     colon resection  . Colon polyps Brother     multiple large polyps  . Colon cancer Brother 9    colon resection 2010  . Cancer Brother   . Spina bifida Brother     died at birth  . Other Paternal Uncle     colostomy  . Colon cancer Paternal Uncle     in his 27s  . Cancer Paternal Uncle   . Heart failure Maternal Grandmother   . Heart  failure Maternal Uncle   . Stomach cancer Paternal Aunt   . Cancer Paternal Aunt   . Brain cancer Cousin 19  . Colon cancer Other     fathers cousin  . Cancer Other   . Kidney cancer Cousin     maternal cousin  . Other Other 16    multiple lipomas  . Esophageal cancer Neg Hx   . Rectal  cancer Neg Hx     Social History History  Substance Use Topics  . Smoking status: Former Smoker    Types: Cigarettes    Quit date: 08/15/1969  . Smokeless tobacco: Never Used  . Alcohol Use: No     Are there smokers in your home (other than you)? No  Risk Factors Current exercise habits: The patient does not participate in regular exercise at present.  Dietary issues discussed: Limiting high CHO foods   Cardiac risk factors: advanced age (older than 40 for men, 76 for women), diabetes mellitus, dyslipidemia, family history of premature cardiovascular disease, hypertension, obesity (BMI >= 30 kg/m2) and sedentary lifestyle.  Depression Screen (Note: if answer to either of the following is "Yes", a more complete depression screening is indicated)   Over the past 2 weeks, have you felt down, depressed or hopeless? No  Over the past 2 weeks, have you felt little interest or pleasure in doing things? No  Have you lost interest or pleasure in daily life? No  Do you often feel hopeless? No  Do you cry easily over simple problems? No  Activities of Daily Living In your present state of health, do you have any difficulty performing the following activities?:  Driving? No Managing money?  No Feeding yourself? No Getting from bed to chair? No   Climbing a flight of stairs? Yes - gets SOB and leg pain limits how quickly she can climb stairs. Preparing food and eating?: No Bathing or showering? No Getting dressed: No Getting to the toilet? No Using the toilet:No Moving around from place to place: No In the past year have you fallen or had a near fall?:Yes   Are you sexually active?  Yes  Do  you have more than one partner?  No  Hearing Difficulties: Yes - she had assessment from audiologist 2013.  Recommended hearing aids but patient decided not to get hearing aids due to cost Do you often ask people to speak up or repeat themselves? Yes Do you experience ringing or noises in your ears? No Do you have difficulty understanding soft or whispered voices? Yes   Do you feel that you have a problem with memory? No  Do you often misplace items? No  Do you feel safe at home?  Yes  Cognitive Testing  Alert? Yes  Normal Appearance?Yes  Oriented to person? Yes  Place? Yes   Time? Yes  Recall of three objects?  Yes  Can perform simple calculations? Yes  Displays appropriate judgment?Yes  Can read the correct time from a watch face?Yes   Advanced Directives have been discussed with the patient? Yes  List the Names of Other Physician/Practitioners you currently use: 1.  Dr Sallyanne Kuster - Cardiologist 2.  [place] 3.  Dr Sharlett Iles (retired now) - GI  Indicate any recent [place] you may have received from other than [facility] providers in the past year (date may be approximate).  Immunization History  Administered Date(s) Administered  . Influenza,inj,Quad PF,36+ Mos 05/28/2014    Screening Tests Health Maintenance  Topic Date Due  . OPHTHALMOLOGY EXAM  03/23/1958  . PNEUMOCOCCAL POLYSACCHARIDE VACCINE AGE 76 AND OVER  03/23/2013  . MAMMOGRAM  02/11/2014  . ZOSTAVAX  10/28/2014 (Originally 03/23/2008)  . HEMOGLOBIN A1C  07/31/2014  . FOOT EXAM  01/30/2015  . URINE MICROALBUMIN  01/30/2015  . INFLUENZA VACCINE  03/16/2015  . PAP SMEAR  04/03/2015  . COLONOSCOPY  09/16/2016  . TETANUS/TDAP  06/16/2019  All answers were reviewed with the patient and necessary referrals were made:  Cherre Robins, [facility]   07/18/2014   History reviewed: allergies, current medications, past family history, past medical history, past social history, past surgical history and  problem list  Objective:  Body mass index is 43.86 kg/(m^2). BP 122/64 mmHg  Pulse 58  Ht 5' 1"  (1.549 m)  Wt 232 lb (105.235 kg)  BMI 43.86 kg/m2  Assessment:     Initial Medicare Wellness Visit Medication Management     Plan:     During the course of the visit the patient was educated and counseled about appropriate screening and preventive services including:    Pneumococcal vaccine  - appt made to adminstration of Prevnar 13 next week  Influenza vaccine - UTD  Zostavax - checked cost of zostavax was greater than $200 so patient declined  Screening mammography - appt made for mammogram January 2016  Bone densitometry screening - UTD  Colorectal cancer screening - UTD  Glaucoma screening - eye exam needed - patient reminded and offered to make referral.  Patient to call either [email or web address] or [facility] to appt  Nutrition counseling - discussed CHO limiting diet  Advanced directives: has NO advanced directive  - caring info packet given  Change Nexium to oemprazole 41m 1 capsule daily   Orders Placed This Encounter  Procedures  . Microalbumin, urine  . Lipid panel    This external order was created through the Results Console.    Patient Instructions (the written plan) was given to the patient.  Medicare Attestation I have personally reviewed: The patient's medical and social history Their use of alcohol, tobacco or illicit drugs Their current medications and supplements The patient's functional ability including ADLs,fall risks, home safety risks, cognitive, and hearing and visual impairment Diet and physical activities Evidence for depression or mood disorders  The patient's weight, height, BMI, and BP/HR have been recorded in the chart.  I have made referrals, counseling, and provided education to the patient based on review of the above and I have provided the patient with a written personalized care plan for preventive services.     [facility]ins [facility]  07/18/2014

## 2014-07-18 NOTE — Patient Instructions (Signed)
Health Maintenance Summary     Diabetic Eye EXAM Overdue      PNEUMOCOCCAL Vaccine - Prevnar 13 appt made for 07/22/2014     ZOSTAVAX Postponed 10/28/2014  Patient Declined    HEMOGLOBIN A1C Next Due 07/31/2014     FOOT EXAM Next Due 01/30/2015           URINE MICROALBUMIN Overdue      MAMMOGRAM Due now  Appointment made for January 27th at 11:00 am    INFLUENZA VACCINE Next Due 03/16/2015     PAP SMEAR Next Due 04/03/2015     COLONOSCOPY Next Due 09/16/2016     TETANUS/TDAP Next Due 06/16/2019    Preventive Care for Adults A healthy lifestyle and preventive care can promote health and wellness. Preventive health guidelines for women include the following key practices.  A routine yearly physical is a good way to check with your health care provider about your health and preventive screening. It is a chance to share any concerns and updates on your health and to receive a thorough exam.  Visit your dentist for a routine exam and preventive care every 6 months. Brush your teeth twice a day and floss once a day. Good oral hygiene prevents tooth decay and gum disease.  The frequency of eye exams is based on your age, health, family medical history, use of contact lenses, and other factors. Follow your health care provider's recommendations for frequency of eye exams.  Eat a healthy diet. Foods like vegetables, fruits, whole grains, low-fat dairy products, and lean protein foods contain the nutrients you need without too many calories. Decrease your intake of foods high in solid fats, added sugars, and salt. Eat the right amount of calories for you.Get information about a proper diet from your health care provider, if necessary.  Regular physical exercise is one of the most important things you can do for your health. Most adults should get at least 150 minutes of moderate-intensity exercise (any activity that increases your heart rate and causes you to sweat) each week. In addition, most adults need  muscle-strengthening exercises on 2 or more days a week.  Maintain a healthy weight. The body mass index (BMI) is a screening tool to identify possible weight problems. It provides an estimate of body fat based on height and weight. Your health care provider can find your BMI and can help you achieve or maintain a healthy weight.For adults 20 years and older:  A BMI below 18.5 is considered underweight.  A BMI of 18.5 to 24.9 is normal.  A BMI of 25 to 29.9 is considered overweight.  A BMI of 30 and above is considered obese.  Maintain normal blood lipids and cholesterol levels by exercising and minimizing your intake of saturated fat. Eat a balanced diet with plenty of fruit and vegetables. Blood tests for lipids and cholesterol should begin at age 30 and be repeated every 5 years. If your lipid or cholesterol levels are high, you are over 50, or you are at high risk for heart disease, you may need your cholesterol levels checked more frequently.Ongoing high lipid and cholesterol levels should be treated with medicines if diet and exercise are not working.  If you smoke, find out from your health care provider how to quit. If you do not use tobacco, do not start.  Lung cancer screening is recommended for adults aged 98-80 years who are at high risk for developing lung cancer because of a history of smoking. A  yearly low-dose CT scan of the lungs is recommended for people who have at least a 30-pack-year history of smoking and are a current smoker or have quit within the past 15 years. A pack year of smoking is smoking an average of 1 pack of cigarettes a day for 1 year (for example: 1 pack a day for 30 years or 2 packs a day for 15 years). Yearly screening should continue until the smoker has stopped smoking for at least 15 years. Yearly screening should be stopped for people who develop a health problem that would prevent them from having lung cancer treatment.  If you are pregnant, do not  drink alcohol. If you are breastfeeding, be very cautious about drinking alcohol. If you are not pregnant and choose to drink alcohol, do not have more than 1 drink per day. One drink is considered to be 12 ounces (355 mL) of beer, 5 ounces (148 mL) of wine, or 1.5 ounces (44 mL) of liquor.  Avoid use of street drugs. Do not share needles with anyone. Ask for help if you need support or instructions about stopping the use of drugs.  High blood pressure causes heart disease and increases the risk of stroke. Your blood pressure should be checked at least every 1 to 2 years. Ongoing high blood pressure should be treated with medicines if weight loss and exercise do not work.  If you are 10-59 years old, ask your health care provider if you should take aspirin to prevent strokes.  Diabetes screening involves taking a blood sample to check your fasting blood sugar level. This should be done once every 3 years, after age 31, if you are within normal weight and without risk factors for diabetes. Testing should be considered at a younger age or be carried out more frequently if you are overweight and have at least 1 risk factor for diabetes.  Breast cancer screening is essential preventive care for women. You should practice "breast self-awareness." This means understanding the normal appearance and feel of your breasts and may include breast self-examination. Any changes detected, no matter how small, should be reported to a health care provider. Women in their 80s and 30s should have a clinical breast exam (CBE) by a health care provider as part of a regular health exam every 1 to 3 years. After age 30, women should have a CBE every year. Starting at age 31, women should consider having a mammogram (breast X-ray test) every year. Women who have a family history of breast cancer should talk to their health care provider about genetic screening. Women at a high risk of breast cancer should talk to their health  care providers about having an MRI and a mammogram every year.  Breast cancer gene (BRCA)-related cancer risk assessment is recommended for women who have family members with BRCA-related cancers. BRCA-related cancers include breast, ovarian, tubal, and peritoneal cancers. Having family members with these cancers may be associated with an increased risk for harmful changes (mutations) in the breast cancer genes BRCA1 and BRCA2. Results of the assessment will determine the need for genetic counseling and BRCA1 and BRCA2 testing.  Routine pelvic exams to screen for cancer are no longer recommended for nonpregnant women who are considered low risk for cancer of the pelvic organs (ovaries, uterus, and vagina) and who do not have symptoms. Ask your health care provider if a screening pelvic exam is right for you.  If you have had past treatment for cervical cancer or a  condition that could lead to cancer, you need Pap tests and screening for cancer for at least 20 years after your treatment. If Pap tests have been discontinued, your risk factors (such as having a new sexual partner) need to be reassessed to determine if screening should be resumed. Some women have medical problems that increase the chance of getting cervical cancer. In these cases, your health care provider may recommend more frequent screening and Pap tests.  The HPV test is an additional test that may be used for cervical cancer screening. The HPV test looks for the virus that can cause the cell changes on the cervix. The cells collected during the Pap test can be tested for HPV. The HPV test could be used to screen women aged 76 years and older, and should be used in women of any age who have unclear Pap test results. After the age of 76, women should have HPV testing at the same frequency as a Pap test.  Colorectal cancer can be detected and often prevented. Most routine colorectal cancer screening begins at the age of 9 years and  continues through age 69 years. However, your health care provider may recommend screening at an earlier age if you have risk factors for colon cancer. On a yearly basis, your health care provider may provide home test kits to check for hidden blood in the stool. Use of a small camera at the end of a tube, to directly examine the colon (sigmoidoscopy or colonoscopy), can detect the earliest forms of colorectal cancer. Talk to your health care provider about this at age 95, when routine screening begins. Direct exam of the colon should be repeated every 5-10 years through age 74 years, unless early forms of pre-cancerous polyps or small growths are found.  People who are at an increased risk for hepatitis B should be screened for this virus. You are considered at high risk for hepatitis B if:  You were born in a country where hepatitis B occurs often. Talk with your health care provider about which countries are considered high risk.  Your parents were born in a high-risk country and you have not received a shot to protect against hepatitis B (hepatitis B vaccine).  You have HIV or AIDS.  You use needles to inject street drugs.  You live with, or have sex with, someone who has hepatitis B.  You get hemodialysis treatment.  You take certain medicines for conditions like cancer, organ transplantation, and autoimmune conditions.  Hepatitis C blood testing is recommended for all people born from [birth year] through 1965 and any individual with known risks for hepatitis C.  Practice safe sex. Use condoms and avoid high-risk sexual practices to reduce the spread of sexually transmitted infections (STIs). STIs include gonorrhea, chlamydia, syphilis, trichomonas, herpes, HPV, and human immunodeficiency virus (HIV). Herpes, HIV, and HPV are viral illnesses that have no cure. They can result in disability, cancer, and death.  You should be screened for sexually transmitted illnesses (STIs) including gonorrhea  and chlamydia if:  You are sexually active and are younger than 24 years.  You are older than 24 years and your health care provider tells you that you are at risk for this type of infection.  Your sexual activity has changed since you were last screened and you are at an increased risk for chlamydia or gonorrhea. Ask your health care provider if you are at risk.  If you are at risk of being infected with HIV, it is recommended  that you take a prescription medicine daily to prevent HIV infection. This is called preexposure prophylaxis (PrEP). You are considered at risk if:  You are a heterosexual woman, are sexually active, and are at increased risk for HIV infection.  You take drugs by injection.  You are sexually active with a partner who has HIV.  Talk with your health care provider about whether you are at high risk of being infected with HIV. If you choose to begin PrEP, you should first be tested for HIV. You should then be tested every 3 months for as long as you are taking PrEP.  Osteoporosis is a disease in which the bones lose minerals and strength with aging. This can result in serious bone fractures or breaks. The risk of osteoporosis can be identified using a bone density scan. Women ages 40 years and over and women at risk for fractures or osteoporosis should discuss screening with their health care providers. Ask your health care provider whether you should take a calcium supplement or vitamin D to reduce the rate of osteoporosis.  Menopause can be associated with physical symptoms and risks. Hormone replacement therapy is available to decrease symptoms and risks. You should talk to your health care provider about whether hormone replacement therapy is right for you.  Use sunscreen. Apply sunscreen liberally and repeatedly throughout the day. You should seek shade when your shadow is shorter than you. Protect yourself by wearing long sleeves, pants, a wide-brimmed hat, and  sunglasses year round, whenever you are outdoors.  Once a month, do a whole body skin exam, using a mirror to look at the skin on your back. Tell your health care provider of new moles, moles that have irregular borders, moles that are larger than a pencil eraser, or moles that have changed in shape or color.  Stay current with required vaccines (immunizations).  Influenza vaccine. All adults should be immunized every year.  Tetanus, diphtheria, and acellular pertussis (Td, Tdap) vaccine. Pregnant women should receive 1 dose of Tdap vaccine during each pregnancy. The dose should be obtained regardless of the length of time since the last dose. Immunization is preferred during the 27th-36th week of gestation. An adult who has not previously received Tdap or who does not know her vaccine status should receive 1 dose of Tdap. This initial dose should be followed by tetanus and diphtheria toxoids (Td) booster doses every 10 years. Adults with an unknown or incomplete history of completing a 3-dose immunization series with Td-containing vaccines should begin or complete a primary immunization series including a Tdap dose. Adults should receive a Td booster every 10 years.  Varicella vaccine. An adult without evidence of immunity to varicella should receive 2 doses or a second dose if she has previously received 1 dose. Pregnant females who do not have evidence of immunity should receive the first dose after pregnancy. This first dose should be obtained before leaving the health care facility. The second dose should be obtained 4-8 weeks after the first dose.  Human papillomavirus (HPV) vaccine. Females aged 13-26 years who have not received the vaccine previously should obtain the 3-dose series. The vaccine is not recommended for use in pregnant females. However, pregnancy testing is not needed before receiving a dose. If a female is found to be pregnant after receiving a dose, no treatment is needed. In that  case, the remaining doses should be delayed until after the pregnancy. Immunization is recommended for any person with an immunocompromised condition through the  age of 70 years if she did not get any or all doses earlier. During the 3-dose series, the second dose should be obtained 4-8 weeks after the first dose. The third dose should be obtained 24 weeks after the first dose and 16 weeks after the second dose.  Zoster vaccine. One dose is recommended for adults aged 25 years or older unless certain conditions are present.  Measles, mumps, and rubella (MMR) vaccine. Adults born before [birth year] generally are considered immune to measles and mumps. Adults born in [birth year] or later should have 1 or more doses of MMR vaccine unless there is a contraindication to the vaccine or there is laboratory evidence of immunity to each of the three diseases. A routine second dose of MMR vaccine should be obtained at least 28 days after the first dose for students attending postsecondary schools, health care workers, or international travelers. People who received inactivated measles vaccine or an unknown type of measles vaccine during 1963-1967 should receive 2 doses of MMR vaccine. People who received inactivated mumps vaccine or an unknown type of mumps vaccine before 1979 and are at high risk for mumps infection should consider immunization with 2 doses of MMR vaccine. For females of childbearing age, rubella immunity should be determined. If there is no evidence of immunity, females who are not pregnant should be vaccinated. If there is no evidence of immunity, females who are pregnant should delay immunization until after pregnancy. Unvaccinated health care workers born before [birth year] who lack laboratory evidence of measles, mumps, or rubella immunity or laboratory confirmation of disease should consider measles and mumps immunization with 2 doses of MMR vaccine or rubella immunization with 1 dose of MMR vaccine.  Pneumococcal  13-valent conjugate (PCV13) vaccine. When indicated, a person who is uncertain of her immunization history and has no record of immunization should receive the PCV13 vaccine. An adult aged 72 years or older who has certain medical conditions and has not been previously immunized should receive 1 dose of PCV13 vaccine. This PCV13 should be followed with a dose of pneumococcal polysaccharide (PPSV23) vaccine. The PPSV23 vaccine dose should be obtained at least 8 weeks after the dose of PCV13 vaccine. An adult aged 32 years or older who has certain medical conditions and previously received 1 or more doses of PPSV23 vaccine should receive 1 dose of PCV13. The PCV13 vaccine dose should be obtained 1 or more years after the last PPSV23 vaccine dose.  Pneumococcal polysaccharide (PPSV23) vaccine. When PCV13 is also indicated, PCV13 should be obtained first. All adults aged 58 years and older should be immunized. An adult younger than age 37 years who has certain medical conditions should be immunized. Any person who resides in a nursing home or long-term care facility should be immunized. An adult smoker should be immunized. People with an immunocompromised condition and certain other conditions should receive both PCV13 and PPSV23 vaccines. People with human immunodeficiency virus (HIV) infection should be immunized as soon as possible after diagnosis. Immunization during chemotherapy or radiation therapy should be avoided. Routine use of PPSV23 vaccine is not recommended for American Indians, [place] Natives, or people younger than 65 years unless there are medical conditions that require PPSV23 vaccine. When indicated, people who have unknown immunization and have no record of immunization should receive PPSV23 vaccine. One-time revaccination 5 years after the first dose of PPSV23 is recommended for people aged 19-64 years who have chronic kidney failure, nephrotic syndrome, asplenia, or immunocompromised conditions.  People who received 1-2  doses of PPSV23 before age 22 years should receive another dose of PPSV23 vaccine at age 5 years or later if at least 5 years have passed since the previous dose. Doses of PPSV23 are not needed for people immunized with PPSV23 at or after age 46 years.  Meningococcal vaccine. Adults with asplenia or persistent complement component deficiencies should receive 2 doses of quadrivalent meningococcal conjugate (MenACWY-D) vaccine. The doses should be obtained at least 2 months apart. Microbiologists working with certain meningococcal bacteria, [place] recruits, people at risk during an outbreak, and people who travel to or live in countries with a high rate of meningitis should be immunized. A first-year college student up through age 56 years who is living in a residence hall should receive a dose if she did not receive a dose on or after her 16th birthday. Adults who have certain high-risk conditions should receive one or more doses of vaccine.  Hepatitis A vaccine. Adults who wish to be protected from this disease, have certain high-risk conditions, work with hepatitis A-infected animals, work in hepatitis A research labs, or travel to or work in countries with a high rate of hepatitis A should be immunized. Adults who were previously unvaccinated and who anticipate close contact with an international adoptee during the first 60 days after arrival in the [place] from a country with a high rate of hepatitis A should be immunized.  Hepatitis B vaccine. Adults who wish to be protected from this disease, have certain high-risk conditions, may be exposed to blood or other infectious body fluids, are household contacts or sex partners of hepatitis B positive people, are clients or workers in certain care facilities, or travel to or work in countries with a high rate of hepatitis B should be immunized.  Haemophilus influenzae type b (Hib) vaccine. A previously unvaccinated person with  asplenia or sickle cell disease or having a scheduled splenectomy should receive 1 dose of Hib vaccine. Regardless of previous immunization, a recipient of a hematopoietic stem cell transplant should receive a 3-dose series 6-12 months after her successful transplant. Hib vaccine is not recommended for adults with HIV infection. Preventive Services / Frequency Ages 28 to 5 years  Blood pressure check.** / Every 1 to 2 years.  Lipid and cholesterol check.** / Every 5 years beginning at age 70.  Clinical breast exam.** / Every 3 years for women in their 78s and 50s.  BRCA-related cancer risk assessment.** / For women who have family members with a BRCA-related cancer (breast, ovarian, tubal, or peritoneal cancers).  Pap test.** / Every 2 years from ages 34 through 28. Every 3 years starting at age 95 through age 64 or 60 with a history of 3 consecutive normal Pap tests.  HPV screening.** / Every 3 years from ages 53 through ages 85 to 41 with a history of 3 consecutive normal Pap tests.  Hepatitis C blood test.** / For any individual with known risks for hepatitis C.  Skin self-exam. / Monthly.  Influenza vaccine. / Every year.  Tetanus, diphtheria, and acellular pertussis (Tdap, Td) vaccine.** / Consult your health care provider. Pregnant women should receive 1 dose of Tdap vaccine during each pregnancy. 1 dose of Td every 10 years.  Varicella vaccine.** / Consult your health care provider. Pregnant females who do not have evidence of immunity should receive the first dose after pregnancy.  HPV vaccine. / 3 doses over 6 months, if 85 and younger. The vaccine is not recommended for use in pregnant  females. However, pregnancy testing is not needed before receiving a dose.  Measles, mumps, rubella (MMR) vaccine.** / You need at least 1 dose of MMR if you were born in 1957 or later. You may also need a 2nd dose. For females of childbearing age, rubella immunity should be determined. If there  is no evidence of immunity, females who are not pregnant should be vaccinated. If there is no evidence of immunity, females who are pregnant should delay immunization until after pregnancy.  Pneumococcal 13-valent conjugate (PCV13) vaccine.** / Consult your health care provider.  Pneumococcal polysaccharide (PPSV23) vaccine.** / 1 to 2 doses if you smoke cigarettes or if you have certain conditions.  Meningococcal vaccine.** / 1 dose if you are age 39 to 13 years and a Market researcher living in a residence hall, or have one of several medical conditions, you need to get vaccinated against meningococcal disease. You may also need additional booster doses.  Hepatitis A vaccine.** / Consult your health care provider.  Hepatitis B vaccine.** / Consult your health care provider.  Haemophilus influenzae type b (Hib) vaccine.** / Consult your health care provider. Ages 14 to 41 years  Blood pressure check.** / Every 1 to 2 years.  Lipid and cholesterol check.** / Every 5 years beginning at age 68 years.  Lung cancer screening. / Every year if you are aged 27-80 years and have a 30-pack-year history of smoking and currently smoke or have quit within the past 15 years. Yearly screening is stopped once you have quit smoking for at least 15 years or develop a health problem that would prevent you from having lung cancer treatment.  Clinical breast exam.** / Every year after age 64 years.  BRCA-related cancer risk assessment.** / For women who have family members with a BRCA-related cancer (breast, ovarian, tubal, or peritoneal cancers).  Mammogram.** / Every year beginning at age 31 years and continuing for as long as you are in good health. Consult with your health care provider.  Pap test.** / Every 3 years starting at age 11 years through age 72 or 52 years with a history of 3 consecutive normal Pap tests.  HPV screening.** / Every 3 years from ages 15 years through ages 25 to 63  years with a history of 3 consecutive normal Pap tests.  Fecal occult blood test (FOBT) of stool. / Every year beginning at age 56 years and continuing until age 33 years. You may not need to do this test if you get a colonoscopy every 10 years.  Flexible sigmoidoscopy or colonoscopy.** / Every 5 years for a flexible sigmoidoscopy or every 10 years for a colonoscopy beginning at age 5 years and continuing until age 49 years.  Hepatitis C blood test.** / For all people born from [birth year] through 1963 and any individual with known risks for hepatitis C.  Skin self-exam. / Monthly.  Influenza vaccine. / Every year.  Tetanus, diphtheria, and acellular pertussis (Tdap/Td) vaccine.** / Consult your health care provider. Pregnant women should receive 1 dose of Tdap vaccine during each pregnancy. 1 dose of Td every 10 years.  Varicella vaccine.** / Consult your health care provider. Pregnant females who do not have evidence of immunity should receive the first dose after pregnancy.  Zoster vaccine.** / 1 dose for adults aged 24 years or older.  Measles, mumps, rubella (MMR) vaccine.** / You need at least 1 dose of MMR if you were born in 1957 or later. You may also need a 2nd  dose. For females of childbearing age, rubella immunity should be determined. If there is no evidence of immunity, females who are not pregnant should be vaccinated. If there is no evidence of immunity, females who are pregnant should delay immunization until after pregnancy.  Pneumococcal 13-valent conjugate (PCV13) vaccine.** / Consult your health care provider.  Pneumococcal polysaccharide (PPSV23) vaccine.** / 1 to 2 doses if you smoke cigarettes or if you have certain conditions.  Meningococcal vaccine.** / Consult your health care provider.  Hepatitis A vaccine.** / Consult your health care provider.  Hepatitis B vaccine.** / Consult your health care provider.  Haemophilus influenzae type b (Hib) vaccine.** / Consult  your health care provider. Ages 22 years and over  Blood pressure check.** / Every 1 to 2 years.  Lipid and cholesterol check.** / Every 5 years beginning at age 52 years.  Lung cancer screening. / Every year if you are aged 49-80 years and have a 30-pack-year history of smoking and currently smoke or have quit within the past 15 years. Yearly screening is stopped once you have quit smoking for at least 15 years or develop a health problem that would prevent you from having lung cancer treatment.  Clinical breast exam.** / Every year after age 74 years.  BRCA-related cancer risk assessment.** / For women who have family members with a BRCA-related cancer (breast, ovarian, tubal, or peritoneal cancers).  Mammogram.** / Every year beginning at age 56 years and continuing for as long as you are in good health. Consult with your health care provider.  Pap test.** / Every 3 years starting at age 37 years through age 22 or 2 years with 3 consecutive normal Pap tests. Testing can be stopped between 65 and 70 years with 3 consecutive normal Pap tests and no abnormal Pap or HPV tests in the past 10 years.  HPV screening.** / Every 3 years from ages 30 years through ages 40 or 58 years with a history of 3 consecutive normal Pap tests. Testing can be stopped between 65 and 70 years with 3 consecutive normal Pap tests and no abnormal Pap or HPV tests in the past 10 years.  Fecal occult blood test (FOBT) of stool. / Every year beginning at age 22 years and continuing until age 71 years. You may not need to do this test if you get a colonoscopy every 10 years.  Flexible sigmoidoscopy or colonoscopy.** / Every 5 years for a flexible sigmoidoscopy or every 10 years for a colonoscopy beginning at age 70 years and continuing until age 80 years.  Hepatitis C blood test.** / For all people born from [birth year] through 1963 and any individual with known risks for hepatitis C.  Osteoporosis screening.** / A one-time  screening for women ages 73 years and over and women at risk for fractures or osteoporosis.  Skin self-exam. / Monthly.  Influenza vaccine. / Every year.  Tetanus, diphtheria, and acellular pertussis (Tdap/Td) vaccine.** / 1 dose of Td every 10 years.  Varicella vaccine.** / Consult your health care provider.  Zoster vaccine.** / 1 dose for adults aged 52 years or older.  Pneumococcal 13-valent conjugate (PCV13) vaccine.** / Consult your health care provider.  Pneumococcal polysaccharide (PPSV23) vaccine.** / 1 dose for all adults aged 89 years and older.  Meningococcal vaccine.** / Consult your health care provider.  Hepatitis A vaccine.** / Consult your health care provider.  Hepatitis B vaccine.** / Consult your health care provider.  Haemophilus influenzae type b (Hib) vaccine.** / Consult  your health care provider. ** Family history and personal history of risk and conditions may change your health care provider's recommendations. Document Released: 09/27/2001 Document Revised: 12/16/2013 Document Reviewed: 12/27/2010 [facility] Patient Information 2015 [place], [place]. This information is not intended to replace advice given to you by your health care provider. Make sure you discuss any questions you have with your health care provider.

## 2014-07-19 LAB — MICROALBUMIN, URINE: Microalbumin, Urine: <3 ug/mL (ref 0.0–17.0)

## 2014-07-21 ENCOUNTER — Ambulatory Visit (INDEPENDENT_AMBULATORY_CARE_PROVIDER_SITE_OTHER): Payer: Medicare Other | Admitting: *Deleted

## 2014-07-21 DIAGNOSIS — Z23 Encounter for immunization: Secondary | ICD-10-CM

## 2014-07-22 ENCOUNTER — Ambulatory Visit: Payer: Self-pay

## 2014-08-20 ENCOUNTER — Other Ambulatory Visit: Payer: Self-pay | Admitting: Family Medicine

## 2014-08-22 ENCOUNTER — Other Ambulatory Visit: Payer: Self-pay | Admitting: *Deleted

## 2014-08-22 MED ORDER — METOPROLOL TARTRATE 25 MG PO TABS: 12.5000 mg | ORAL_TABLET | Freq: Two times a day (BID) | ORAL | Status: DC

## 2014-08-22 NOTE — Telephone Encounter (Signed)
Rx refill sent to patient pharmacy   

## 2014-09-10 ENCOUNTER — Other Ambulatory Visit: Payer: Self-pay | Admitting: Cardiovascular Disease

## 2014-09-10 ENCOUNTER — Other Ambulatory Visit: Payer: Self-pay | Admitting: Family Medicine

## 2014-09-10 NOTE — Telephone Encounter (Signed)
Rx(s) sent to pharmacy electronically.  

## 2014-09-11 ENCOUNTER — Other Ambulatory Visit: Payer: Self-pay

## 2014-09-11 MED ORDER — ATORVASTATIN CALCIUM 40 MG PO TABS: 40.0000 mg | ORAL_TABLET | Freq: Every day | ORAL | Status: DC

## 2014-09-22 ENCOUNTER — Other Ambulatory Visit: Payer: Self-pay | Admitting: Family Medicine

## 2014-09-24 ENCOUNTER — Telehealth: Payer: Self-pay | Admitting: Cardiovascular Disease

## 2014-09-24 NOTE — Telephone Encounter (Signed)
Pt called in stating her doctor in [place] is requesting records from her sleep apena test that was done here. They would like them faxed to their office

## 2014-09-29 NOTE — Telephone Encounter (Signed)
Can this encounter be closed?

## 2014-10-08 NOTE — Telephone Encounter (Signed)
Has this been taken care of?

## 2014-10-20 HISTORY — PX: CARPAL TUNNEL RELEASE: SHX101

## 2014-10-28 ENCOUNTER — Telehealth: Payer: Self-pay | Admitting: Family Medicine

## 2014-11-10 ENCOUNTER — Other Ambulatory Visit: Payer: Self-pay | Admitting: Family Medicine

## 2014-11-12 ENCOUNTER — Telehealth: Payer: Self-pay | Admitting: Nurse Practitioner

## 2014-11-12 NOTE — Telephone Encounter (Signed)
Gave pt phone# to the [facility] in [place] where last mammogram was done

## 2014-12-17 ENCOUNTER — Other Ambulatory Visit: Payer: Self-pay | Admitting: Nurse Practitioner

## 2014-12-17 NOTE — Telephone Encounter (Signed)
Last labs 01/2014

## 2014-12-20 ENCOUNTER — Other Ambulatory Visit: Payer: Self-pay | Admitting: Nurse Practitioner

## 2014-12-22 ENCOUNTER — Other Ambulatory Visit: Payer: Self-pay | Admitting: Nurse Practitioner

## 2014-12-22 MED ORDER — ATORVASTATIN CALCIUM 40 MG PO TABS: 40.0000 mg | ORAL_TABLET | Freq: Every day | ORAL | Status: DC

## 2014-12-22 NOTE — Telephone Encounter (Signed)
Medication refilled for one month.  Patient will need to keep upcoming appointment.

## 2014-12-23 NOTE — Telephone Encounter (Signed)
No lipid/liver labs since 01/2014

## 2014-12-23 NOTE — Telephone Encounter (Signed)
lipitor cannot be filled until seen for labs

## 2014-12-25 ENCOUNTER — Encounter: Payer: Self-pay | Admitting: Nurse Practitioner

## 2014-12-25 ENCOUNTER — Ambulatory Visit (INDEPENDENT_AMBULATORY_CARE_PROVIDER_SITE_OTHER): Payer: Medicare Other | Admitting: Nurse Practitioner

## 2014-12-25 VITALS — BP 142/76 | HR 54 | Temp 97.4°F | Ht 61.0 in | Wt 231.6 lb

## 2014-12-25 DIAGNOSIS — M797 Fibromyalgia: Secondary | ICD-10-CM | POA: Diagnosis not present

## 2014-12-25 DIAGNOSIS — E119 Type 2 diabetes mellitus without complications: Secondary | ICD-10-CM

## 2014-12-25 DIAGNOSIS — M25511 Pain in right shoulder: Secondary | ICD-10-CM | POA: Diagnosis not present

## 2014-12-25 DIAGNOSIS — F411 Generalized anxiety disorder: Secondary | ICD-10-CM | POA: Diagnosis not present

## 2014-12-25 DIAGNOSIS — K219 Gastro-esophageal reflux disease without esophagitis: Secondary | ICD-10-CM

## 2014-12-25 DIAGNOSIS — E669 Obesity, unspecified: Secondary | ICD-10-CM | POA: Diagnosis not present

## 2014-12-25 DIAGNOSIS — E785 Hyperlipidemia, unspecified: Secondary | ICD-10-CM

## 2014-12-25 DIAGNOSIS — R600 Localized edema: Secondary | ICD-10-CM

## 2014-12-25 DIAGNOSIS — I10 Essential (primary) hypertension: Secondary | ICD-10-CM | POA: Diagnosis not present

## 2014-12-25 DIAGNOSIS — S3992XA Unspecified injury of lower back, initial encounter: Secondary | ICD-10-CM

## 2014-12-25 DIAGNOSIS — J410 Simple chronic bronchitis: Secondary | ICD-10-CM | POA: Diagnosis not present

## 2014-12-25 LAB — POCT GLYCOSYLATED HEMOGLOBIN (HGB A1C): Hemoglobin A1C: 6.9

## 2014-12-25 MED ORDER — TRAMADOL HCL 50 MG PO TABS: ORAL_TABLET | ORAL | Status: DC

## 2014-12-25 MED ORDER — ALPRAZOLAM 0.25 MG PO TABS: 0.2500 mg | ORAL_TABLET | Freq: Two times a day (BID) | ORAL | Status: DC | PRN

## 2014-12-25 MED ORDER — ATORVASTATIN CALCIUM 40 MG PO TABS: 40.0000 mg | ORAL_TABLET | Freq: Every day | ORAL | Status: DC

## 2014-12-25 MED ORDER — OMEPRAZOLE 40 MG PO CPDR: 40.0000 mg | DELAYED_RELEASE_CAPSULE | Freq: Every day | ORAL | Status: DC

## 2014-12-25 MED ORDER — BENAZEPRIL HCL 40 MG PO TABS: 40.0000 mg | ORAL_TABLET | Freq: Every day | ORAL | Status: DC

## 2014-12-25 MED ORDER — METOPROLOL TARTRATE 25 MG PO TABS: 12.5000 mg | ORAL_TABLET | Freq: Two times a day (BID) | ORAL | Status: DC

## 2014-12-25 MED ORDER — FUROSEMIDE 40 MG PO TABS: ORAL_TABLET | ORAL | Status: DC

## 2014-12-25 NOTE — Patient Instructions (Signed)

## 2014-12-25 NOTE — Progress Notes (Signed)
Subjective:    Patient ID: Stacey Summers, female    DOB: Nov 11, 1945, 69 y.o.   MRN: [id number]  Patient here today for follow up of chronic medical problems.   Hypertension This is a chronic problem. The current episode started more than 1 year ago. The problem is unchanged. The problem is controlled. Pertinent negatives include no headaches, malaise/fatigue, palpitations, peripheral edema or shortness of breath. There are no associated agents to hypertension. Risk factors for coronary artery disease include dyslipidemia, obesity, post-menopausal state and sedentary lifestyle. Past treatments include diuretics, ACE inhibitors and beta blockers. The current treatment provides moderate improvement. Compliance problems include diet and exercise.   Hyperlipidemia This is a chronic problem. The current episode started more than 1 year ago. Recent lipid tests were reviewed and are variable. Exacerbating diseases include diabetes and obesity. She has no history of hypothyroidism. Pertinent negatives include no shortness of breath. Current antihyperlipidemic treatment includes statins. The current treatment provides moderate improvement of lipids. Compliance problems include adherence to diet and adherence to exercise.  Risk factors for coronary artery disease include diabetes mellitus, dyslipidemia, hypertension, obesity, post-menopausal and a sedentary lifestyle.  Diabetes She presents for her follow-up diabetic visit. She has type 2 diabetes mellitus. Her disease course has been stable. Pertinent negatives for hypoglycemia include no headaches. There are no diabetic associated symptoms. Pertinent negatives for diabetes include no polydipsia, no polyphagia, no polyuria and no visual change. Symptoms are stable. Pertinent negatives for diabetic complications include no heart disease or nephropathy. Risk factors for coronary artery disease include diabetes mellitus, dyslipidemia, hypertension, obesity,  post-menopausal and sedentary lifestyle. Current diabetic treatment includes diet. She is compliant with treatment some of the time. Her weight is stable. She is following a high fat/cholesterol diet. She has not had a previous visit with a dietitian. She rarely participates in exercise. Her breakfast blood glucose is taken between 9-10 am. Her breakfast blood glucose range is generally 140-180 mg/dl. Her overall blood glucose range is 140-180 mg/dl. An ACE inhibitor/angiotensin II receptor blocker is being taken. She does not see a podiatrist.Eye exam is not current.  Fibromyalgia Taking ultram and flexeril that makes pain tolerable- she has good days and bad days GAD Xanax works well but has to take BID GERD Currently on nexium- keeps symptoms under control. Peripheral edema Has gotten worse since her last colonoscopy- takes lasix 40 mg 1/2 in Am and 1 po qhs- Patient saw cardiologist last week for SOB and swelling- he said her heart was okay. COPD Currently on advair daily - has not needed albuterol in awhile.  * right shoulder pain - Hurts when she raises arm up- also has neck pain when she is holding head down reading something     Review of Systems  Constitutional: Negative for malaise/fatigue.  Respiratory: Negative for shortness of breath.   Cardiovascular: Negative for palpitations.  Endocrine: Negative for polydipsia, polyphagia and polyuria.  Neurological: Negative for headaches.       Objective:   Physical Exam  Constitutional: She is oriented to person, place, and time. She appears well-developed and well-nourished.  HENT:  Nose: Nose normal.  Mouth/Throat: Oropharynx is clear and moist.  Eyes: EOM are normal.  Neck: Trachea normal, normal range of motion and full passive range of motion without pain. Neck supple. No JVD present. Carotid bruit is not present. No thyromegaly present.  Cardiovascular: Normal rate, regular rhythm, normal heart sounds and intact distal  pulses.  Exam reveals no gallop and no  friction rub.   No murmur heard. Pulmonary/Chest: Effort normal and breath sounds normal.  Abdominal: Soft. Bowel sounds are normal. She exhibits no distension and no mass. There is no tenderness.  Musculoskeletal: Normal range of motion. She exhibits edema (2+ bilaterally).  FROM of cervical spine without pain FROM of right shoulder with pain on abduction past 90 degrees  Lymphadenopathy:    She has no cervical adenopathy.  Neurological: She is alert and oriented to person, place, and time. She has normal reflexes.  Multiple areas of tenderness up and down back  Skin: Skin is warm and dry.  Psychiatric: She has a normal mood and affect. Her behavior is normal. Judgment and thought content normal.    BP 142/76 mmHg  Pulse 54  Temp(Src) 97.4 F (36.3 C) (Oral)  Ht 5' 1" (1.549 m)  Wt 231 lb 9.6 oz (105.053 kg)  BMI 43.78 kg/m2  Results for orders placed or performed in visit on 12/25/14  POCT glycosylated hemoglobin (Hb A1C)  Result Value Ref Range   Hemoglobin A1C 6.9            Assessment & Plan:    1. Essential hypertension Do not add salt to diet - CMP14+EGFR - metoprolol tartrate (LOPRESSOR) 25 MG tablet; Take 0.5 tablets (12.5 mg total) by mouth 2 (two) times daily.  Dispense: 90 tablet; Refill: 3 - benazepril (LOTENSIN) 40 MG tablet; Take 1 tablet (40 mg total) by mouth daily.  Dispense: 90 tablet; Refill: 3  2. Type 2 diabetes mellitus without complication Continue to watch carbs in diet - POCT glycosylated hemoglobin (Hb A1C)  3. Simple chronic bronchitis  4. Fibromyalgia muscle pain Exercise encouraged  5. Obesity Again exercise encouraged  6. Hyperlipidemia Low fta diet - NMR, lipoprofile - atorvastatin (LIPITOR) 40 MG tablet; Take 1 tablet (40 mg total) by mouth daily.  Dispense: 90 tablet; Refill: 1  7. GAD (generalized anxiety disorder) Stress management - ALPRAZolam (XANAX) 0.25 MG tablet; Take 1 tablet  (0.25 mg total) by mouth 2 (two) times daily as needed.  Dispense: 30 tablet; Refill: 0  8. Bilateral leg edema Elevate legs when sitting - furosemide (LASIX) 40 MG tablet; TAKE 1 BY MOUTH EVERY MORNING AND 1/2 BY MOUTH AT 1 IN THE AFTERNOON  Dispense: 135 tablet; Refill: 1  9. Gastroesophageal reflux disease without esophagitis Avoid spicy foods Do not eat 2 hours prior to bedtime  - omeprazole (PRILOSEC) 40 MG capsule; Take 1 capsule (40 mg total) by mouth daily.  Dispense: 90 capsule; Refill: 1  10. Right shoulder pain -referral to [facility]  Labs pending Health maintenance reviewed Diet and exercise encouraged Continue all meds Follow up  In 3 month   [name], FNP    

## 2014-12-26 LAB — NMR, LIPOPROFILE
Cholesterol: 140 mg/dL (ref 100–199)
HDL CHOLESTEROL BY NMR: 42 mg/dL (ref >39)
HDL PARTICLE NUMBER: 24.9 umol/L — AB (ref >=30.5)
LDL Particle Number: 923 nmol/L (ref <1000)
LDL Size: 21.5 nm (ref >20.5)
LDL-C: 80 mg/dL (ref 0–99)
Small LDL Particle Number: 296 nmol/L (ref <=527)
TRIGLYCERIDES BY NMR: 91 mg/dL (ref 0–149)

## 2014-12-26 LAB — CMP14+EGFR
ALT: 29 IU/L (ref 0–32)
AST: 34 IU/L (ref 0–40)
Albumin/Globulin Ratio: 2 (ref 1.1–2.5)
Albumin: 4.1 g/dL (ref 3.6–4.8)
Alkaline Phosphatase: 91 IU/L (ref 39–117)
BUN/Creatinine Ratio: 34 — ABNORMAL HIGH (ref 11–26)
BUN: 31 mg/dL — AB (ref 8–27)
Bilirubin Total: 1.6 mg/dL — ABNORMAL HIGH (ref 0.0–1.2)
CO2: 26 mmol/L (ref 18–29)
CREATININE: 0.91 mg/dL (ref 0.57–1.00)
Calcium: 8.8 mg/dL (ref 8.7–10.3)
Chloride: 101 mmol/L (ref 97–108)
GFR calc Af Amer: 76 mL/min/{1.73_m2} (ref >59)
GFR, EST NON AFRICAN AMERICAN: 66 mL/min/{1.73_m2} (ref >59)
GLUCOSE: 152 mg/dL — AB (ref 65–99)
Globulin, Total: 2.1 g/dL (ref 1.5–4.5)
POTASSIUM: 4.2 mmol/L (ref 3.5–5.2)
Sodium: 143 mmol/L (ref 134–144)
Total Protein: 6.2 g/dL (ref 6.0–8.5)

## 2015-01-15 ENCOUNTER — Encounter: Payer: Self-pay | Admitting: Family Medicine

## 2015-01-16 ENCOUNTER — Other Ambulatory Visit: Payer: Self-pay | Admitting: Family Medicine

## 2015-01-16 ENCOUNTER — Telehealth: Payer: Self-pay | Admitting: Nurse Practitioner

## 2015-01-16 NOTE — Telephone Encounter (Signed)
Done earlier today

## 2015-02-17 ENCOUNTER — Other Ambulatory Visit: Payer: Self-pay

## 2015-02-17 MED ORDER — GLUCOSE BLOOD VI STRP: ORAL_STRIP | Status: DC

## 2015-02-21 ENCOUNTER — Encounter: Payer: Self-pay | Admitting: *Deleted

## 2015-02-27 ENCOUNTER — Telehealth: Payer: Self-pay | Admitting: Nurse Practitioner

## 2015-02-27 MED ORDER — GLUCOSE BLOOD VI STRP
ORAL_STRIP | Status: DC
Start: 2015-02-27 — End: 2015-04-21

## 2015-02-27 NOTE — Telephone Encounter (Signed)
done

## 2015-03-24 ENCOUNTER — Telehealth: Payer: Self-pay | Admitting: *Deleted

## 2015-03-24 DIAGNOSIS — F411 Generalized anxiety disorder: Secondary | ICD-10-CM

## 2015-03-24 DIAGNOSIS — S3992XA Unspecified injury of lower back, initial encounter: Secondary | ICD-10-CM

## 2015-03-24 MED ORDER — ALPRAZOLAM 0.25 MG PO TABS: 0.2500 mg | ORAL_TABLET | Freq: Two times a day (BID) | ORAL | Status: DC | PRN

## 2015-03-24 MED ORDER — TRAMADOL HCL 50 MG PO TABS: ORAL_TABLET | ORAL | Status: DC

## 2015-03-24 NOTE — Telephone Encounter (Signed)
Patient states that they are leaving for [place] on Friday for the father of her dad. She is worried that she won't have enough xanax and tramadol to make it through the whole trip. Can her rxs be written and she take them? Please advise

## 2015-03-24 NOTE — Telephone Encounter (Signed)
lmovm that written Rx is at front desk ready for pickup

## 2015-03-24 NOTE — Telephone Encounter (Signed)
rx ready for pick up

## 2015-03-30 ENCOUNTER — Ambulatory Visit: Payer: Medicare Other | Admitting: Nurse Practitioner

## 2015-03-31 ENCOUNTER — Ambulatory Visit: Payer: Medicare Other | Admitting: Nurse Practitioner

## 2015-04-21 ENCOUNTER — Encounter: Payer: Self-pay | Admitting: Nurse Practitioner

## 2015-04-21 ENCOUNTER — Ambulatory Visit (INDEPENDENT_AMBULATORY_CARE_PROVIDER_SITE_OTHER): Payer: Medicare Other | Admitting: Nurse Practitioner

## 2015-04-21 VITALS — BP 150/73 | HR 64 | Temp 97.0°F | Ht 61.0 in | Wt 227.0 lb

## 2015-04-21 DIAGNOSIS — E785 Hyperlipidemia, unspecified: Secondary | ICD-10-CM

## 2015-04-21 DIAGNOSIS — I10 Essential (primary) hypertension: Secondary | ICD-10-CM | POA: Diagnosis not present

## 2015-04-21 DIAGNOSIS — R600 Localized edema: Secondary | ICD-10-CM

## 2015-04-21 DIAGNOSIS — F411 Generalized anxiety disorder: Secondary | ICD-10-CM | POA: Diagnosis not present

## 2015-04-21 DIAGNOSIS — E119 Type 2 diabetes mellitus without complications: Secondary | ICD-10-CM

## 2015-04-21 DIAGNOSIS — K219 Gastro-esophageal reflux disease without esophagitis: Secondary | ICD-10-CM

## 2015-04-21 DIAGNOSIS — J410 Simple chronic bronchitis: Secondary | ICD-10-CM | POA: Diagnosis not present

## 2015-04-21 LAB — POCT GLYCOSYLATED HEMOGLOBIN (HGB A1C): Hemoglobin A1C: 6.7

## 2015-04-21 NOTE — Progress Notes (Signed)
Subjective:    Patient ID: Stacey Summers, female    DOB: 01/01/1946, 69 y.o.   MRN: [id number]  Patient here today for follow up of chronic medical problems.   Hypertension This is a chronic problem. The current episode started more than 1 year ago. The problem is unchanged. The problem is controlled. Pertinent negatives include no headaches, malaise/fatigue, palpitations, peripheral edema or shortness of breath. There are no associated agents to hypertension. Risk factors for coronary artery disease include dyslipidemia, obesity, post-menopausal state and sedentary lifestyle. Past treatments include diuretics, ACE inhibitors and beta blockers. The current treatment provides moderate improvement. Compliance problems include diet and exercise.   Hyperlipidemia This is a chronic problem. The current episode started more than 1 year ago. Recent lipid tests were reviewed and are variable. Exacerbating diseases include diabetes and obesity. She has no history of hypothyroidism. Factors aggravating her hyperlipidemia include fatty foods. Pertinent negatives include no shortness of breath. Current antihyperlipidemic treatment includes statins. The current treatment provides moderate improvement of lipids. Compliance problems include adherence to diet and adherence to exercise.  Risk factors for coronary artery disease include diabetes mellitus, dyslipidemia, hypertension, obesity, post-menopausal and a sedentary lifestyle.  Diabetes She presents for her follow-up diabetic visit. She has type 2 diabetes mellitus. Her disease course has been stable. Pertinent negatives for hypoglycemia include no headaches. There are no diabetic associated symptoms. Pertinent negatives for diabetes include no polydipsia, no polyphagia, no polyuria and no visual change. Symptoms are stable. Pertinent negatives for diabetic complications include no heart disease or nephropathy. Risk factors for coronary artery disease include  diabetes mellitus, dyslipidemia, hypertension, obesity, post-menopausal and sedentary lifestyle. Current diabetic treatment includes diet. She is compliant with treatment some of the time. Her weight is stable. She is following a high fat/cholesterol diet. She has not had a previous visit with a dietitian. She rarely participates in exercise. Her lunch blood glucose is taken between 11-12 pm. Her lunch blood glucose range is generally 130-140 mg/dl. Her overall blood glucose range is 140-180 mg/dl. An ACE inhibitor/angiotensin II receptor blocker is being taken. She does not see a podiatrist.Eye exam is current (August 2016).  Fibromyalgia Taking ultram and flexeril that makes pain tolerable- she has good days and bad days. States she does not take medication everyday if she does not need to.  GAD Xanax works well and only takes when she needs it. GERD Currently on nexium- keeps symptoms under control. Peripheral edema Reports she is scheduled to see cardiologist in November and is on same amount of lasix and edema has not been as bad.  COPD Currently on advair daily - has not needed albuterol in awhile.       Review of Systems  Constitutional: Negative for malaise/fatigue.  Respiratory: Negative for shortness of breath.   Cardiovascular: Negative for palpitations.  Endocrine: Negative for polydipsia, polyphagia and polyuria.  Neurological: Negative for headaches.  All other systems reviewed and are negative.      Objective:   Physical Exam  Constitutional: She is oriented to person, place, and time. She appears well-developed and well-nourished.  HENT:  Nose: Nose normal.  Mouth/Throat: Oropharynx is clear and moist.  Eyes: EOM are normal.  Neck: Trachea normal, normal range of motion and full passive range of motion without pain. Neck supple. No JVD present. Carotid bruit is not present. No thyromegaly present.  Cardiovascular: Normal rate, regular rhythm, normal heart sounds and  intact distal pulses.  Exam reveals no gallop and no  friction rub.   No murmur heard. Pulmonary/Chest: Effort normal and breath sounds normal.  Abdominal: Soft. Bowel sounds are normal. She exhibits no distension and no mass. There is no tenderness.  Musculoskeletal: Normal range of motion. She exhibits edema (2+ bilaterally).  FROM of cervical spine without pain FROM of right shoulder with pain on abduction past 90 degrees  Lymphadenopathy:    She has no cervical adenopathy.  Neurological: She is alert and oriented to person, place, and time. She has normal reflexes.  Multiple areas of tenderness up and down back  Skin: Skin is warm and dry.  Psychiatric: She has a normal mood and affect. Her behavior is normal. Judgment and thought content normal.    BP 150/73 mmHg  Pulse 64  Temp(Src) 97 F (36.1 C) (Oral)  Ht 5' 1"  (1.549 m)  Wt 227 lb (102.967 kg)  BMI 42.91 kg/m2  Results for orders placed or performed in visit on 04/21/15  POCT glycosylated hemoglobin (Hb A1C)  Result Value Ref Range   Hemoglobin A1C 6.7        Assessment & Plan:  1. Essential hypertension No salt added to diet. Continue with exercise - CMP14+EGFR  2. Type 2 diabetes mellitus without complication Continue with strict diet and exercise, follow up labs in 3 months. - POCT glycosylated hemoglobin (Hb A1C)  3. Hyperlipidemia Avoid fatty foods, continue with excerise - Lipid panel  4. Simple chronic bronchitis Continue with current medications.  5. Gastroesophageal reflux disease without esophagitis Avoid spicy foods and eating 2 hours prior to bedtime.  6. GAD (generalized anxiety disorder) Stress management  7. Bilateral leg edema Elevate legs when sitting  Schedule pap for next visit Continue all meds Labs pending Health Maintenance reviewed Diet and exercise encouraged RTO 3 months  Mary-Margaret Hassell Done, FNP

## 2015-04-21 NOTE — Patient Instructions (Signed)
Fat and Cholesterol Control Diet Fat and cholesterol levels in your blood and organs are influenced by your diet. High levels of fat and cholesterol may lead to diseases of the heart, small and large blood vessels, gallbladder, liver, and pancreas. CONTROLLING FAT AND CHOLESTEROL WITH DIET Although exercise and lifestyle factors are important, your diet is key. That is because certain foods are known to raise cholesterol and others to lower it. The goal is to balance foods for their effect on cholesterol and more importantly, to replace saturated and trans fat with other types of fat, such as monounsaturated fat, polyunsaturated fat, and omega-3 fatty acids. On average, a person should consume no more than 15 to 17 g of saturated fat daily. Saturated and trans fats are considered "bad" fats, and they will raise LDL cholesterol. Saturated fats are primarily found in animal products such as meats, butter, and cream. However, that does not mean you need to give up all your favorite foods. Today, there are good tasting, low-fat, low-cholesterol substitutes for most of the things you like to eat. Choose low-fat or nonfat alternatives. Choose round or loin cuts of red meat. These types of cuts are lowest in fat and cholesterol. Chicken (without the skin), fish, veal, and ground turkey breast are great choices. Eliminate fatty meats, such as hot dogs and salami. Even shellfish have little or no saturated fat. Have a 3 oz (85 g) portion when you eat lean meat, poultry, or fish. Trans fats are also called "partially hydrogenated oils." They are oils that have been scientifically manipulated so that they are solid at room temperature resulting in a longer shelf life and improved taste and texture of foods in which they are added. Trans fats are found in stick margarine, some tub margarines, cookies, crackers, and baked goods.  When baking and cooking, oils are a great substitute for butter. The monounsaturated oils are  especially beneficial since it is believed they lower LDL and raise HDL. The oils you should avoid entirely are saturated tropical oils, such as coconut and palm.  Remember to eat a lot from food groups that are naturally free of saturated and trans fat, including fish, fruit, vegetables, beans, grains (barley, rice, couscous, bulgur wheat), and pasta (without cream sauces).  IDENTIFYING FOODS THAT LOWER FAT AND CHOLESTEROL  Soluble fiber may lower your cholesterol. This type of fiber is found in fruits such as apples, vegetables such as broccoli, potatoes, and carrots, legumes such as beans, peas, and lentils, and grains such as barley. Foods fortified with plant sterols (phytosterol) may also lower cholesterol. You should eat at least 2 g per day of these foods for a cholesterol lowering effect.  Read package labels to identify low-saturated fats, trans fat free, and low-fat foods at the supermarket. Select cheeses that have only 2 to 3 g saturated fat per ounce. Use a heart-healthy tub margarine that is free of trans fats or partially hydrogenated oil. When buying baked goods (cookies, crackers), avoid partially hydrogenated oils. Breads and muffins should be made from whole grains (whole-wheat or whole oat flour, instead of "flour" or "enriched flour"). Buy non-creamy canned soups with reduced salt and no added fats.  FOOD PREPARATION TECHNIQUES  Never deep-fry. If you must fry, either stir-fry, which uses very little fat, or use non-stick cooking sprays. When possible, broil, bake, or roast meats, and steam vegetables. Instead of putting butter or margarine on vegetables, use lemon and herbs, applesauce, and cinnamon (for squash and sweet potatoes). Use nonfat   yogurt, salsa, and low-fat dressings for salads.  LOW-SATURATED FAT / LOW-FAT FOOD SUBSTITUTES Meats / Saturated Fat (g)  Avoid: Steak, marbled (3 oz/85 g) / 11 g  Choose: Steak, lean (3 oz/85 g) / 4 g  Avoid: Hamburger (3 oz/85 g) / 7  g  Choose: Hamburger, lean (3 oz/85 g) / 5 g  Avoid: Ham (3 oz/85 g) / 6 g  Choose: Ham, lean cut (3 oz/85 g) / 2.4 g  Avoid: Chicken, with skin, dark meat (3 oz/85 g) / 4 g  Choose: Chicken, skin removed, dark meat (3 oz/85 g) / 2 g  Avoid: Chicken, with skin, light meat (3 oz/85 g) / 2.5 g  Choose: Chicken, skin removed, light meat (3 oz/85 g) / 1 g Dairy / Saturated Fat (g)  Avoid: Whole milk (1 cup) / 5 g  Choose: Low-fat milk, 2% (1 cup) / 3 g  Choose: Low-fat milk, 1% (1 cup) / 1.5 g  Choose: Skim milk (1 cup) / 0.3 g  Avoid: Hard cheese (1 oz/28 g) / 6 g  Choose: Skim milk cheese (1 oz/28 g) / 2 to 3 g  Avoid: Cottage cheese, 4% fat (1 cup) / 6.5 g  Choose: Low-fat cottage cheese, 1% fat (1 cup) / 1.5 g  Avoid: Ice cream (1 cup) / 9 g  Choose: Sherbet (1 cup) / 2.5 g  Choose: Nonfat frozen yogurt (1 cup) / 0.3 g  Choose: Frozen fruit bar / trace  Avoid: Whipped cream (1 tbs) / 3.5 g  Choose: Nondairy whipped topping (1 tbs) / 1 g Condiments / Saturated Fat (g)  Avoid: Mayonnaise (1 tbs) / 2 g  Choose: Low-fat mayonnaise (1 tbs) / 1 g  Avoid: Butter (1 tbs) / 7 g  Choose: Extra light margarine (1 tbs) / 1 g  Avoid: Coconut oil (1 tbs) / 11.8 g  Choose: Olive oil (1 tbs) / 1.8 g  Choose: Corn oil (1 tbs) / 1.7 g  Choose: Safflower oil (1 tbs) / 1.2 g  Choose: Sunflower oil (1 tbs) / 1.4 g  Choose: Soybean oil (1 tbs) / 2.4 g  Choose: Canola oil (1 tbs) / 1 g Document Released: 08/01/2005 Document Revised: 11/26/2012 Document Reviewed: 10/30/2013 [employer] Patient Information 2015 [employer]. This information is not intended to replace advice given to you by your health care provider. Make sure you discuss any questions you have with your health care provider.  

## 2015-04-22 LAB — CMP14+EGFR
A/G RATIO: 1.9 (ref 1.1–2.5)
ALK PHOS: 80 IU/L (ref 39–117)
ALT: 27 IU/L (ref 0–32)
AST: 35 IU/L (ref 0–40)
Albumin: 3.7 g/dL (ref 3.6–4.8)
BUN/Creatinine Ratio: 28 — ABNORMAL HIGH (ref 11–26)
BUN: 19 mg/dL (ref 8–27)
Bilirubin Total: 1.5 mg/dL — ABNORMAL HIGH (ref 0.0–1.2)
CO2: 27 mmol/L (ref 18–29)
Calcium: 8.9 mg/dL (ref 8.7–10.3)
Chloride: 102 mmol/L (ref 97–108)
Creatinine, Ser: 0.67 mg/dL (ref 0.57–1.00)
GFR calc Af Amer: 105 mL/min/{1.73_m2} (ref >59)
GFR, EST NON AFRICAN AMERICAN: 91 mL/min/{1.73_m2} (ref >59)
Globulin, Total: 2 g/dL (ref 1.5–4.5)
Glucose: 166 mg/dL — ABNORMAL HIGH (ref 65–99)
Potassium: 4.1 mmol/L (ref 3.5–5.2)
SODIUM: 142 mmol/L (ref 134–144)
Total Protein: 5.7 g/dL — ABNORMAL LOW (ref 6.0–8.5)

## 2015-04-22 LAB — LIPID PANEL
CHOL/HDL RATIO: 3.1 ratio (ref 0.0–4.4)
Cholesterol, Total: 127 mg/dL (ref 100–199)
HDL: 41 mg/dL (ref >39)
LDL CALC: 69 mg/dL (ref 0–99)
TRIGLYCERIDES: 87 mg/dL (ref 0–149)
VLDL Cholesterol Cal: 17 mg/dL (ref 5–40)

## 2015-05-28 ENCOUNTER — Ambulatory Visit: Payer: Medicare Other | Admitting: Nurse Practitioner

## 2015-06-01 ENCOUNTER — Other Ambulatory Visit: Payer: Self-pay | Admitting: Family Medicine

## 2015-06-02 ENCOUNTER — Ambulatory Visit (INDEPENDENT_AMBULATORY_CARE_PROVIDER_SITE_OTHER): Payer: Medicare Other | Admitting: Nurse Practitioner

## 2015-06-02 ENCOUNTER — Other Ambulatory Visit: Payer: Self-pay | Admitting: Nurse Practitioner

## 2015-06-02 ENCOUNTER — Encounter: Payer: Self-pay | Admitting: Nurse Practitioner

## 2015-06-02 VITALS — BP 143/71 | HR 63 | Temp 97.2°F | Ht 61.0 in | Wt 228.0 lb

## 2015-06-02 DIAGNOSIS — M5442 Lumbago with sciatica, left side: Secondary | ICD-10-CM | POA: Diagnosis not present

## 2015-06-02 DIAGNOSIS — R609 Edema, unspecified: Secondary | ICD-10-CM | POA: Diagnosis not present

## 2015-06-02 DIAGNOSIS — M5441 Lumbago with sciatica, right side: Secondary | ICD-10-CM

## 2015-06-02 MED ORDER — METOLAZONE 2.5 MG PO TABS: ORAL_TABLET | ORAL | Status: DC

## 2015-06-02 MED ORDER — GABAPENTIN 300 MG PO CAPS: 300.0000 mg | ORAL_CAPSULE | Freq: Two times a day (BID) | ORAL | Status: DC

## 2015-06-02 NOTE — Progress Notes (Signed)
   Subjective:    Patient ID: Stacey Summers, female    DOB: 06/11/1946, 69 y.o.   MRN: [id number]  HPI: Pt here today with back pain, bilateral leg pain that radiates down into her feet. Trouble sleeping at night because she is unable to lay on her back and hips for long period of time. States she was told in the past that she needs to have back surgery done, but does not want to do that at this time. No incontinence of bowel or bladder. Rates pain 8/10 at times- movement increases pain- nothing really seem sto help it.    Review of Systems  Respiratory: Negative.   Cardiovascular: Negative.   Musculoskeletal: Positive for back pain. Negative for myalgias and joint swelling.  Neurological: Negative for dizziness, tremors, weakness, light-headedness and numbness.       Objective:   Physical Exam  Constitutional: She appears well-developed and well-nourished.  Neck: Normal range of motion. Neck supple.  Cardiovascular: Normal rate, regular rhythm, normal heart sounds and intact distal pulses.   Pulmonary/Chest: Effort normal and breath sounds normal.  Musculoskeletal: Normal range of motion.         BP 143/71 mmHg  Pulse 63  Temp(Src) 97.2 F (36.2 C) (Oral)  Ht 5' 1"  (1.549 m)  Wt 228 lb (103.42 kg)  BMI 43.10 kg/m2  Assessment & Plan:  1. Bilateral low back pain with sciatica, sciatica laterality unspecified Moist heat to back Rest No heavy lifting  RTO prn Continue ultram as needed - gabapentin (NEURONTIN) 300 MG capsule; Take 1 capsule (300 mg total) by mouth 2 (two) times daily.  Dispense: 60 capsule; Refill: 2 - Ambulatory referral to [facility], [place]

## 2015-06-02 NOTE — Addendum Note (Signed)
Addended by: Chevis Pretty on: 06/02/2015 12:54 PM   Modules accepted: Orders

## 2015-06-02 NOTE — Patient Instructions (Signed)

## 2015-06-04 ENCOUNTER — Telehealth: Payer: Self-pay

## 2015-06-04 NOTE — Telephone Encounter (Signed)
Insurance prior authorized Metolazone x 1 year

## 2015-06-16 ENCOUNTER — Ambulatory Visit (INDEPENDENT_AMBULATORY_CARE_PROVIDER_SITE_OTHER): Payer: Medicare Other

## 2015-06-16 DIAGNOSIS — Z23 Encounter for immunization: Secondary | ICD-10-CM

## 2015-07-16 ENCOUNTER — Encounter: Payer: Self-pay | Admitting: Cardiovascular Disease

## 2015-07-16 ENCOUNTER — Ambulatory Visit (INDEPENDENT_AMBULATORY_CARE_PROVIDER_SITE_OTHER): Payer: Medicare Other | Admitting: Cardiovascular Disease

## 2015-07-16 VITALS — BP 142/81 | HR 67 | Resp 16 | Ht 62.0 in | Wt 233.6 lb

## 2015-07-16 DIAGNOSIS — I10 Essential (primary) hypertension: Secondary | ICD-10-CM

## 2015-07-16 MED ORDER — AMLODIPINE BESYLATE 5 MG PO TABS: 5.0000 mg | ORAL_TABLET | Freq: Every day | ORAL | Status: DC

## 2015-07-16 NOTE — Patient Instructions (Addendum)
Your physician has recommended you make the following change in your medication:   Stop metoprolol  Start amlodipine 5 mg daily  Start wearing your CPAP nightly  Dr. Sallyanne Kuster recommends that you schedule a follow-up appointment in: 6 months     Merry Christmas and [place]!!
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
## 2015-07-18 ENCOUNTER — Encounter: Payer: Self-pay | Admitting: Cardiovascular Disease

## 2015-07-30 ENCOUNTER — Telehealth: Payer: Self-pay | Admitting: Cardiovascular Disease

## 2015-07-30 DIAGNOSIS — R0600 Dyspnea, unspecified: Secondary | ICD-10-CM

## 2015-07-30 DIAGNOSIS — R0689 Other abnormalities of breathing: Principal | ICD-10-CM

## 2015-07-30 NOTE — Telephone Encounter (Signed)
Left message to call. I do not see recommendations for pulmonology referral in last OV notes, will route to Dr. Sallyanne Kuster for recommendation.

## 2015-07-30 NOTE — Telephone Encounter (Signed)
Pt called me back. She reports she did speak w/ Dr. Sallyanne Kuster regarding pulm referral specifically d/t some issues she was having w/ continued SOB despite med changes, etc. Informed her I have sent msg to Dr. Sallyanne Kuster requesting referral or recommendation.  She does acknowledge CPAP also needs to be refit. She has not been compliant w/ use yet due to air "blowing up around eyes". I suggested calling DME company since she is having issues w/ an improper seal on mask. Pt voiced understanding regarding this.

## 2015-07-30 NOTE — Telephone Encounter (Signed)
She said she thought Dr C was supposed to be getting her an appt with a pulmonary doctor,she have not heard anything.

## 2015-07-31 NOTE — Telephone Encounter (Signed)
Yes, please refer to pulmonary specialist.

## 2015-07-31 NOTE — Telephone Encounter (Signed)
Pt informed referral has been ordered, she voiced thanks.

## 2015-08-04 ENCOUNTER — Ambulatory Visit (INDEPENDENT_AMBULATORY_CARE_PROVIDER_SITE_OTHER): Payer: Medicare Other | Admitting: Pulmonary Disease

## 2015-08-04 ENCOUNTER — Ambulatory Visit (INDEPENDENT_AMBULATORY_CARE_PROVIDER_SITE_OTHER)
Admission: RE | Admit: 2015-08-04 | Discharge: 2015-08-04 | Disposition: A | Discharge status: Home / Self Care | Payer: Medicare Other | Source: Ambulatory Visit | Attending: Pulmonary Disease | Admitting: Pulmonary Disease

## 2015-08-04 ENCOUNTER — Encounter: Payer: Self-pay | Admitting: Pulmonary Disease

## 2015-08-04 VITALS — BP 138/78 | HR 73 | Temp 98.3°F | Ht 62.0 in | Wt 230.0 lb

## 2015-08-04 DIAGNOSIS — R0600 Dyspnea, unspecified: Secondary | ICD-10-CM | POA: Diagnosis not present

## 2015-08-04 DIAGNOSIS — R05 Cough: Secondary | ICD-10-CM | POA: Diagnosis not present

## 2015-08-04 DIAGNOSIS — E668 Other obesity: Secondary | ICD-10-CM

## 2015-08-04 DIAGNOSIS — G4733 Obstructive sleep apnea (adult) (pediatric): Secondary | ICD-10-CM | POA: Diagnosis not present

## 2015-08-04 DIAGNOSIS — R5381 Other malaise: Secondary | ICD-10-CM

## 2015-08-04 DIAGNOSIS — R059 Cough, unspecified: Secondary | ICD-10-CM

## 2015-08-04 DIAGNOSIS — J984 Other disorders of lung: Secondary | ICD-10-CM | POA: Diagnosis not present

## 2015-08-04 DIAGNOSIS — S3992XA Unspecified injury of lower back, initial encounter: Secondary | ICD-10-CM

## 2015-08-04 DIAGNOSIS — E669 Obesity, unspecified: Secondary | ICD-10-CM

## 2015-08-04 MED ORDER — TRAMADOL HCL 50 MG PO TABS: 50.0000 mg | ORAL_TABLET | Freq: Every day | ORAL | Status: DC | PRN

## 2015-08-04 NOTE — Patient Instructions (Signed)
Today we updated your med list in our EPIC system...    Continue your current medications the same...  Today we checked a pulmonary function test & an ambulatory oxygen test... We also did a follow up CXR...    We will contact you w/ the results when available...   You appear to have some pulmonary restriction and severe deconditioning...    The good news is that you don't need additional medications...    The difficult news is that improvement requires you to get on a good low carb low fat diet, and LOSE WEIGHT...  You also need an exercise program>  Consider SILVER SNEAKERS, swimming, using a Physiological scientist, etc...    Your breathing will improve as your weight comes down...  As we discussed>  You can use the TRAMADOL as needed for cough...  Call for any questions...  Let's plan a follow up visit in 2-11mo sooner if needed for problems..Marland KitchenMarland Kitchen

## 2015-08-05 ENCOUNTER — Encounter: Payer: Self-pay | Admitting: Pulmonary Disease

## 2015-08-05 NOTE — Progress Notes (Signed)
Subjective:     Patient ID: Stacey Summers, female   DOB: April 16, 1946, 69 y.o.   MRN: [id number]  HPI     Her PCP is Dr. Morrie Summers in [place], [place]...   ~  August 04, 2015:  Initial pulmonary consult w/ SN>        67 y/o WF, referred by Stacey Summers for a pulmonary ebvaluation due to dyspnea;  She is a difficult historian-  c/o SOB/ dyspnea w/ min activity and ADLs x yrs she says (eg- housework, "I do what I have to do", no change x yrs, "I push myself");  She also notes a mild cough- on & off, "I had polyps in my throat removed yrs ago", min sputum, no hemoptysis, ?chest discomfort- "I feel funny in my chest";  She went to [facility] in Nov w/ incr congestion after that due to the smoke she thinks;  She has not tried any OTC meds for her cough or SOB...       She is an ex-smoker having smoked for only 41yr betw 89& 69y/o all <1ppd and quit ~1970;  She notes that she was told by Stacey Summers that she had asthma in the past, and treated for occas bronchitic infections, placed on ADVAIR100-2spBid;  She denies hx pneumonia, Tb or known exposures;  She worked for [employer] & was at 6 diff plants but no raw cotton dust exposure and no known asbestos exposure;  She does note some choking episodes esp while eating, she's had a prev EGD 2/14 by Stacey Summers- +esophagitis & stricture dilated, mild antritis, neg HPylori, rec to continue PPI & she is on Protonix40/d...       She has been evaluated for OSA by Stacey Summers> She c/o snoring, non-restorative sleep, difficulty falluing asleep & maintaining sleep; PSG 04/16/13 showed AHI=38.7/hr (all hypopneas) & 52/hr during REM, RDI= 45/hr & 57/hr during REM, there was O2 desat to 75%, no signif PLMS;  She subseq had a CPAP titration but I could not locate this report- she was subsequently placed on BiPAP 10-25 by Stacey Summers& Stacey Summers;  Download in [id number] 25/30 days, usually 6+hrs per day, good control w/ AHI<1... There are no recent downloads in Epic, she indicates that she stopped using  the bipap on her own over the last yr, DrC has encouraged her to use it nightly...       She is followed for CARDS by Stacey Summers (notes reviewed)> HBP, mult coronary risk factors, heart murmur, palpitations, +FamHx of sudden death, HL, Obesity, Fatigue, VI/ edema      She is followed by her PCP, Stacey Summers>  HBP, DM, HL, Obesity, edema, fatigue (she thinks it's fibromyalgia), back pain, venous insuffic w/ edema  EXAM shows Afeb, VSS , O2sat=98% on RA; she is 230#, 5'2"Tall, BMI=42; HEENT- neg, mallampati2;  Chest- clear w/o w/r/r;  Heart- RR gr1-2/6 SEM w/o r/g;  Abd- obese soft neg;  Ext- VI, tr edema w/o c/c; Neuro- intact...   CXR 08/04/15 showed norm heart size, clear lungs, NAD...  Spirometry 08/04/15 showed FVC=1.87 (69%), FEV1=1.51 (72%), %1sec=81%, mid-flows wnl at 88% predicted... C/w mild-mod restriction...  Ambulatory oximetry> O2sat=98% on RA at rest w/ pulse=76/min; she walked 3laps in office today w/ lowest O2sat=96% w/ pulse=92/min... IMP/PLAN>>  Stacey Summers's dyspnea appears to be multifactorial w/ components from pulm restriction due to obesity, deconditioning, OSA (not using CPAP regularly), cardiac factors;  Her cough is likely related to her reflux/ GERD/ choking episodes & she needs a vigorous antireflux regimen, plus  her PPI, and trial of Tramadol to suppress the cough (may need to STOP her ACE rx later);  Overall improvement in her dyspnea is going to require her to get on a diet, increase her exercise program, and get her weight down but she does not appear very motivated;  She also needs to get back on her BiPAP for her OSA- I suggested to her to f/u w/ Stacey Summers who did her initial sleep eval... ROV in 2-64moto check her progress.    Past Medical History  Diagnosis Date  . Asthma >> on ADVAIR100-2spBid per Stacey Summers     bronchitis  . Allergy     seasonal  . Arthritis   . GERD (gastroesophageal reflux disease) >> on Prilosec40   . Hyperlipidemia >> on Lipitor40   . Hypertension >>  on Amlod5, Lotensin40, Lasix40am&20pm, Zaroxyln2.5 2d/wk   . Tubular adenoma of colon 05/09/12    "innumerable number" of flat polyps  . DJD (degenerative joint disease) >> on Neurontin300Bid & Flexeril5Tid prn   . Anxiety >> on Xanax0.25 prn   . Cataract   . History of abnormal mammogram     Past Surgical History  Procedure Laterality Date  . Tonsillectomy  1965  . Cholecystectomy  1990  . Cesarean section  1977  . Tumor removal      benign fatty tumors on abdomen  . Polypectomy      vocal cord  . Larynx surgery      POLYP REMOVAL  . Carpal tunnel release Left 10/20/14    [facility]    Outpatient Encounter Prescriptions as of 08/04/2015  Medication Sig  . ALPRAZolam (XANAX) 0.25 MG tablet Take 1 tablet (0.25 mg total) by mouth 2 (two) times daily as needed.  .Marland Kitchenaspirin 81 MG tablet Take 81 mg by mouth as needed for pain.  .Marland Kitchenatorvastatin (LIPITOR) 40 MG tablet Take 1 tablet (40 mg total) by mouth daily.  . benazepril (LOTENSIN) 40 MG tablet Take 1 tablet (40 mg total) by mouth daily.  . Blood Glucose Monitoring Suppl (LIBERTY BLOOD GLUCOSE MONITOR) DEVI 1 each by Does not apply route daily. Test 1x per day and prn-dx250.02  . Calcium-Vitamin D-Vitamin K (CALCIUM SOFT CHEWS) 500-100-40 MG-UNT-MCG CHEW Chew 1 each by mouth daily.  . cholecalciferol (VITAMIN D) 1000 UNITS tablet Take 1,000 Units by mouth daily.   . cyclobenzaprine (FLEXERIL) 5 MG tablet TAKE 1 BY MOUTH 3 TIMES DAILY AS NEEDED FOR MUSCLE SPASMS  --MRonnald CollumNP  . Fluticasone-Salmeterol (ADVAIR) 100-50 MCG/DOSE AEPB Inhale 1 puff into the lungs 2 (two) times daily.  . furosemide (LASIX) 40 MG tablet TAKE 1 BY MOUTH EVERY MORNING AND 1/2 BY MOUTH AT 1 IN THE AFTERNOON  . gabapentin (NEURONTIN) 300 MG capsule Take 1 capsule (300 mg total) by mouth 2 (two) times daily.  .Marland Kitchenglucose blood test strip Test 1x per day and prn- dx 250.02  . metolazone (ZAROXOLYN) 2.5 MG tablet 1 po 2x a week as needed  . omeprazole (PRILOSEC) 40  MG capsule TAKE 1 BY MOUTH DAILY --[name]  . ONETOUCH DELICA LANCETS 360YMISC 1 each by Does not apply route daily. Test 1X per day and prn-- dx 250.02  . traMADol (ULTRAM) 50 MG tablet Take 1 tablet (50 mg total) by mouth daily as needed.  . [DISCONTINUED] traMADol (ULTRAM) 50 MG tablet TAKE ONE TABLET BY MOUTH AS NEEDED FOR PAIN  . amLODipine (NORVASC) 5 MG tablet Take 1 tablet (5 mg total) by  mouth daily. (Patient not taking: Reported on 08/04/2015)   No facility-administered encounter medications on file as of 08/04/2015.    No Known Allergies   Immunization History  Administered Date(s) Administered  . Influenza,inj,Quad PF,36+ Mos 05/28/2014, 06/16/2015  . Pneumococcal Conjugate-13 07/21/2014    Family History  Problem Relation Age of Onset  . Prostate cancer Father     dx in his late 59s  . Colon cancer Father     dx in his 22s  . Cancer Father   . Heart attack Father 68  . Heart failure Mother   . Heart disease Mother   . Other Brother     colon resection  . Colon polyps Brother     multiple large polyps  . Colon cancer Brother 23    colon resection 2010  . Cancer Brother   . Spina bifida Brother     died at birth  . Other Paternal Uncle     colostomy  . Colon cancer Paternal Uncle     in his 42s  . Cancer Paternal Uncle   . Heart failure Maternal Grandmother   . Heart failure Maternal Uncle   . Stomach cancer Paternal Aunt   . Cancer Paternal Aunt   . Brain cancer Cousin 19  . Colon cancer Other     fathers cousin  . Cancer Other   . Kidney cancer Cousin     maternal cousin  . Other Other 16    multiple lipomas  . Esophageal cancer Neg Hx   . Rectal cancer Neg Hx     Social History   Social History  . Marital Status: Married    Spouse Name: N/A  . Number of Children: 3  . Years of Education: N/A   Occupational History  . retired    Social History Main Topics  . Smoking status: Former Smoker -- 1.00 packs/day for 3 years     Types: Cigarettes    Quit date: 08/15/1969  . Smokeless tobacco: Never Used  . Alcohol Use: No  . Drug Use: No  . Sexual Activity: Yes   Other Topics Concern  . Not on file   Social History Narrative    Current Medications, Allergies, Past Medical History, Past Surgical History, Family History, and Social History were reviewed in [employer] record.   Review of Systems             All symptoms NEG except where BOLDED >>  Constitutional:  F/C/S, fatigue, anorexia, unexpected weight change. HEENT:  HA, visual changes, hearing loss, earache, nasal symptoms, sore throat, mouth sores, hoarseness. Resp:  cough, sputum, hemoptysis; SOB, tightness, wheezing. Cardio:  CP, palpit, DOE, orthopnea, edema. GI:  N/V/D/C, blood in stool; reflux, abd pain, distention, gas. GU:  dysuria, freq, urgency, hematuria, flank pain, voiding difficulty. MS:  joint pain, swelling, tenderness, decr ROM; neck pain, back pain, etc. Neuro:  HA, tremors, seizures, dizziness, syncope, weakness, numbness, gait abn. Skin:  suspicious lesions or skin rash. Heme:  adenopathy, bruising, bleeding. Psyche:  confusion, agitation, sleep disturbance, hallucinations, anxiety, depression suicidal.   Objective:   Physical Exam       Vital Signs:  Reviewed...  General:  WD, Obese, 69 y/o WM in NAD; alert & oriented; pleasant & cooperative... HEENT:  [place]/AT; Conjunctiva- pink, Sclera- nonicteric, EOM-wnl, PERRLA, EACs-clear, TMs-wnl; NOSE-clear; THROAT-clear & wnl. Neck:  Supple w/ fair ROM; no JVD; normal carotid impulses w/o bruits; no thyromegaly or nodules palpated; no lymphadenopathy. Chest:  Clear to P & A; without wheezes, rales, or rhonchi heard. Heart:  Regular Rhythm; norm S1 & S2 Gr1-2/6 SEM w/o rubs or gallops detected. Abdomen:  Obese, soft & nontender- no guarding or rebound; normal bowel sounds; no organomegaly or masses palpated. Ext:  decr ROM; without deformities +arthritic changes;  no varicose veins, +venous insuffic, tr edema;  Pulses intact w/o bruits. Neuro:  CNs II-XII intact; motor testing normal; no focal neuro deficit Derm:  No lesions noted; no rash etc. Lymph:  No cervical, supraclavicular, axillary, or inguinal adenopathy palpated.   Assessment:      IMP>>      Dyspnea> multifactorial w/ components from pulm restriction due to obesity, deconditioning/sedentary, OSA w/ BiPAP noncompliance, cardiac factors;  Her pulm eval is benign other than some expected restriction from her obesity; ?if she's had any improvement on the Advair100?  In my opinion she will need to put in the hard work of DIET/ EXERCISE/ WT REDUCTION to see any improvement in her breathing...    Cough> this is most likely related to her GERD, reflux, choking episodes and she may need GI f/u soon; for now we reviewed antireflux regimen- Prilosec40 taken 74mn before the eve meal, NPO after dinner, elev HOB 6" blocks;  We will try TRAMADOL524mTid prn for the cough...    Hx OSA on BiPAP per Stacey Summers> she has not been using the BiPAP & it appears as though she hasn't had a sleep f/u appt in some time; [facility]...    Morbid Obesity> she needs to put in the hard work w/ DIET/ EXERCISE/ WT REDUCTION as discussed...     Hx Esophagitis, GERD/reflux, stricture> she may need GI f/u soon but for now we reviewed Prilosec40 about 3018mbefore the eve meal, NPO after dinner, elev HOB 6" blocks...    Cardiac Hx> HBP, HxCP, HxPalpit, mult cardiac risk factors, +FamHx sudden cardiac death- followed by Stacey Summers...    Medical Hx> DM, Obesity, GERD, Divertics/ polyps, Back pain, Leg pain, Fatigue, Venous Insuffic/ edema- followed by Dr. DonMorrie Summers al...  PLAN>>      Ariyon's dyspnea appears to be multifactorial w/ components from pulm restriction due to obesity, deconditioning, OSA (not using CPAP regularly), cardiac factors;  Her cough is likely related to her  reflux/ GERD/ choking episodes & she needs a vigorous antireflux regimen, plus her PPI, and trial of Tramadol to suppress the cough (may need to STOP her ACE rx later);  Overall improvement in her dyspnea is going to require her to get on a diet, increase her exercise program, and get her weight down but she does not appear very motivated;  She also needs to get back on her BiPAP for her OSA- I suggested to her to f/u w/ DrKQuentin Oreo did her initial sleep eval... ROV in 2-42mo71mocheck her progress.      Plan:     Patient's Medications  New Prescriptions   No medications on file  Previous Medications   ALPRAZOLAM (XANAX) 0.25 MG TABLET    Take 1 tablet (0.25 mg total) by mouth 2 (two) times daily as needed.   AMLODIPINE (NORVASC) 5 MG TABLET    Take 1 tablet (5 mg total) by mouth daily.   ASPIRIN 81 MG TABLET    Take 81 mg by mouth as needed for pain.   ATORVASTATIN (LIPITOR) 40 MG TABLET    Take 1 tablet (40 mg total)  by mouth daily.   BENAZEPRIL (LOTENSIN) 40 MG TABLET    Take 1 tablet (40 mg total) by mouth daily.   BLOOD GLUCOSE MONITORING SUPPL (LIBERTY BLOOD GLUCOSE MONITOR) DEVI    1 each by Does not apply route daily. Test 1x per day and prn-dx250.02   CALCIUM-VITAMIN D-VITAMIN K (CALCIUM SOFT CHEWS) 500-100-40 MG-UNT-MCG CHEW    Chew 1 each by mouth daily.   CHOLECALCIFEROL (VITAMIN D) 1000 UNITS TABLET    Take 1,000 Units by mouth daily.    CYCLOBENZAPRINE (FLEXERIL) 5 MG TABLET    TAKE 1 BY MOUTH 3 TIMES DAILY AS NEEDED FOR MUSCLE SPASMS  --MARY MARTIN NP   FLUTICASONE-SALMETEROL (ADVAIR) 100-50 MCG/DOSE AEPB    Inhale 1 puff into the lungs 2 (two) times daily.   FUROSEMIDE (LASIX) 40 MG TABLET    TAKE 1 BY MOUTH EVERY MORNING AND 1/2 BY MOUTH AT 1 IN THE AFTERNOON   GABAPENTIN (NEURONTIN) 300 MG CAPSULE    Take 1 capsule (300 mg total) by mouth 2 (two) times daily.   GLUCOSE BLOOD TEST STRIP    Test 1x per day and prn- dx 250.02   METOLAZONE (ZAROXOLYN) 2.5 MG TABLET    1 po 2x a week  as needed   OMEPRAZOLE (PRILOSEC) 40 MG CAPSULE    TAKE 1 BY MOUTH DAILY --[name]   ONETOUCH DELICA LANCETS 63K MISC    1 each by Does not apply route daily. Test 1X per day and prn-- dx 250.02  Modified Medications   Modified Medication Previous Medication   TRAMADOL (ULTRAM) 50 MG TABLET traMADol (ULTRAM) 50 MG tablet      Take 1 tablet (50 mg total) by mouth daily as needed.    TAKE ONE TABLET BY MOUTH AS NEEDED FOR PAIN  Discontinued Medications   No medications on file

## 2015-09-08 ENCOUNTER — Telehealth: Payer: Self-pay | Admitting: Nurse Practitioner

## 2015-09-08 NOTE — Telephone Encounter (Signed)
Call taken care of.

## 2015-09-18 ENCOUNTER — Other Ambulatory Visit: Payer: Self-pay | Admitting: Nurse Practitioner

## 2015-09-18 DIAGNOSIS — F411 Generalized anxiety disorder: Secondary | ICD-10-CM

## 2015-09-18 MED ORDER — ALPRAZOLAM 0.25 MG PO TABS: 0.2500 mg | ORAL_TABLET | Freq: Two times a day (BID) | ORAL | Status: DC | PRN

## 2015-09-18 NOTE — Telephone Encounter (Signed)
Please call in xanax 0.25 1 po qd prn #30  with 1 refills

## 2015-09-18 NOTE — Telephone Encounter (Signed)
Xanax called in and pt is aware.

## 2015-09-21 ENCOUNTER — Encounter: Payer: Self-pay | Admitting: Nurse Practitioner

## 2015-09-21 ENCOUNTER — Ambulatory Visit (INDEPENDENT_AMBULATORY_CARE_PROVIDER_SITE_OTHER): Payer: PPO | Admitting: Nurse Practitioner

## 2015-09-21 VITALS — BP 129/77 | HR 73 | Temp 96.7°F | Ht 62.0 in | Wt 223.0 lb

## 2015-09-21 DIAGNOSIS — M797 Fibromyalgia: Secondary | ICD-10-CM | POA: Diagnosis not present

## 2015-09-21 DIAGNOSIS — R600 Localized edema: Secondary | ICD-10-CM | POA: Diagnosis not present

## 2015-09-21 DIAGNOSIS — I10 Essential (primary) hypertension: Secondary | ICD-10-CM | POA: Diagnosis not present

## 2015-09-21 DIAGNOSIS — F411 Generalized anxiety disorder: Secondary | ICD-10-CM | POA: Diagnosis not present

## 2015-09-21 DIAGNOSIS — E785 Hyperlipidemia, unspecified: Secondary | ICD-10-CM | POA: Diagnosis not present

## 2015-09-21 DIAGNOSIS — E119 Type 2 diabetes mellitus without complications: Secondary | ICD-10-CM | POA: Diagnosis not present

## 2015-09-21 DIAGNOSIS — Z23 Encounter for immunization: Secondary | ICD-10-CM | POA: Diagnosis not present

## 2015-09-21 DIAGNOSIS — M5442 Lumbago with sciatica, left side: Secondary | ICD-10-CM | POA: Diagnosis not present

## 2015-09-21 DIAGNOSIS — K219 Gastro-esophageal reflux disease without esophagitis: Secondary | ICD-10-CM | POA: Diagnosis not present

## 2015-09-21 DIAGNOSIS — J410 Simple chronic bronchitis: Secondary | ICD-10-CM | POA: Diagnosis not present

## 2015-09-21 DIAGNOSIS — S3992XA Unspecified injury of lower back, initial encounter: Secondary | ICD-10-CM

## 2015-09-21 DIAGNOSIS — M5441 Lumbago with sciatica, right side: Secondary | ICD-10-CM | POA: Diagnosis not present

## 2015-09-21 LAB — POCT GLYCOSYLATED HEMOGLOBIN (HGB A1C): HEMOGLOBIN A1C: 6.5

## 2015-09-21 MED ORDER — ATORVASTATIN CALCIUM 40 MG PO TABS: 40.0000 mg | ORAL_TABLET | Freq: Every day | ORAL | Status: DC

## 2015-09-21 MED ORDER — FLUTICASONE-SALMETEROL 100-50 MCG/DOSE IN AEPB
1.0000 | INHALATION_SPRAY | Freq: Two times a day (BID) | RESPIRATORY_TRACT | Status: DC
Start: 2015-09-21 — End: 2016-09-30

## 2015-09-21 MED ORDER — ALPRAZOLAM 0.25 MG PO TABS: 0.2500 mg | ORAL_TABLET | Freq: Two times a day (BID) | ORAL | Status: DC | PRN

## 2015-09-21 MED ORDER — TRAMADOL HCL 50 MG PO TABS: 50.0000 mg | ORAL_TABLET | Freq: Every day | ORAL | Status: DC | PRN

## 2015-09-21 MED ORDER — FUROSEMIDE 40 MG PO TABS: ORAL_TABLET | ORAL | Status: DC

## 2015-09-21 MED ORDER — OMEPRAZOLE 40 MG PO CPDR: DELAYED_RELEASE_CAPSULE | ORAL | Status: DC

## 2015-09-21 MED ORDER — GABAPENTIN 300 MG PO CAPS: 300.0000 mg | ORAL_CAPSULE | Freq: Two times a day (BID) | ORAL | Status: DC

## 2015-09-21 MED ORDER — TRAMADOL HCL 50 MG PO TABS
50.0000 mg | ORAL_TABLET | Freq: Every day | ORAL | Status: DC | PRN
Start: 2015-09-21 — End: 2015-09-21

## 2015-09-21 MED ORDER — BENAZEPRIL HCL 40 MG PO TABS: 40.0000 mg | ORAL_TABLET | Freq: Every day | ORAL | Status: DC

## 2015-09-21 MED ORDER — AMLODIPINE BESYLATE 5 MG PO TABS: 5.0000 mg | ORAL_TABLET | Freq: Every day | ORAL | Status: DC

## 2015-09-21 NOTE — Progress Notes (Signed)
Subjective:    Patient ID: Stacey Summers, female    DOB: Apr 06, 1946, 70 y.o.   MRN: [id number]  Patient here today for follow up of chronic medical problems. No complaints today.  Outpatient Encounter Prescriptions as of 09/21/2015  Medication Sig  . ALPRAZolam (XANAX) 0.25 MG tablet Take 1 tablet (0.25 mg total) by mouth 2 (two) times daily as needed.  Marland Kitchen amLODipine (NORVASC) 5 MG tablet Take 1 tablet (5 mg total) by mouth daily. (Patient not taking: Reported on 08/04/2015)  . aspirin 81 MG tablet Take 81 mg by mouth as needed for pain.  Marland Kitchen atorvastatin (LIPITOR) 40 MG tablet Take 1 tablet (40 mg total) by mouth daily.  . benazepril (LOTENSIN) 40 MG tablet Take 1 tablet (40 mg total) by mouth daily.  . Blood Glucose Monitoring Suppl (LIBERTY BLOOD GLUCOSE MONITOR) DEVI 1 each by Does not apply route daily. Test 1x per day and prn-dx250.02  . Calcium-Vitamin D-Vitamin K (CALCIUM SOFT CHEWS) 500-100-40 MG-UNT-MCG CHEW Chew 1 each by mouth daily.  . cholecalciferol (VITAMIN D) 1000 UNITS tablet Take 1,000 Units by mouth daily.   . cyclobenzaprine (FLEXERIL) 5 MG tablet TAKE 1 BY MOUTH 3 TIMES DAILY AS NEEDED FOR MUSCLE SPASMS  --Ronnald Collum NP  . Fluticasone-Salmeterol (ADVAIR) 100-50 MCG/DOSE AEPB Inhale 1 puff into the lungs 2 (two) times daily.  . furosemide (LASIX) 40 MG tablet TAKE 1 BY MOUTH EVERY MORNING AND 1/2 BY MOUTH AT 1 IN THE AFTERNOON  . gabapentin (NEURONTIN) 300 MG capsule Take 1 capsule (300 mg total) by mouth 2 (two) times daily.  Marland Kitchen glucose blood test strip Test 1x per day and prn- dx 250.02  . metolazone (ZAROXOLYN) 2.5 MG tablet 1 po 2x a week as needed  . omeprazole (PRILOSEC) 40 MG capsule TAKE 1 BY MOUTH DAILY --MARY-MARGARET Tymira Horkey  . ONETOUCH DELICA LANCETS 01B MISC 1 each by Does not apply route daily. Test 1X per day and prn-- dx 250.02  . traMADol (ULTRAM) 50 MG tablet Take 1 tablet (50 mg total) by mouth daily as needed.   No facility-administered encounter  medications on file as of 09/21/2015.       Hypertension This is a chronic problem. The current episode started more than 1 year ago. The problem is unchanged. The problem is controlled. Pertinent negatives include no headaches, malaise/fatigue, palpitations or peripheral edema. Agents associated with hypertension include NSAIDs. Risk factors for coronary artery disease include dyslipidemia, obesity, post-menopausal state and sedentary lifestyle. Past treatments include diuretics, ACE inhibitors and beta blockers. The current treatment provides moderate improvement. Compliance problems include diet and exercise.   Hyperlipidemia This is a chronic problem. The current episode started more than 1 year ago. Recent lipid tests were reviewed and are variable. Exacerbating diseases include diabetes and obesity. She has no history of hypothyroidism. Factors aggravating her hyperlipidemia include fatty foods. Current antihyperlipidemic treatment includes statins. The current treatment provides moderate improvement of lipids. Compliance problems include adherence to diet and adherence to exercise.  Risk factors for coronary artery disease include diabetes mellitus, dyslipidemia, hypertension, obesity, post-menopausal and a sedentary lifestyle.  Diabetes She presents for her follow-up diabetic visit. She has type 2 diabetes mellitus. Her disease course has been stable. Pertinent negatives for hypoglycemia include no headaches. There are no diabetic associated symptoms. Pertinent negatives for diabetes include no polydipsia, no polyphagia, no polyuria and no visual change. Symptoms are stable. Pertinent negatives for diabetic complications include no heart disease or nephropathy. Risk factors for  coronary artery disease include diabetes mellitus, dyslipidemia, hypertension, obesity, post-menopausal and sedentary lifestyle. Current diabetic treatment includes diet. She is compliant with treatment some of the time. Her  weight is stable. She is following a high fat/cholesterol diet. When asked about meal planning, she reported none. She has not had a previous visit with a dietitian. She rarely participates in exercise. Her breakfast blood glucose is taken between 9-10 am. Her breakfast blood glucose range is generally 130-140 mg/dl. Her highest blood glucose is >200 mg/dl. Her overall blood glucose range is 140-180 mg/dl. An ACE inhibitor/angiotensin II receptor blocker is being taken. She does not see a podiatrist.Eye exam is current (August 2016).  Fibromyalgia Taking ultram and flexeril that makes pain tolerable- she has good days and bad days. States she does not take medication everyday if she does not need to.  GAD Xanax works well and only takes when she needs it. GERD Currently on omeprazole- keeps symptoms under control. Peripheral edema Reports she has seen a cardiologist, thinks it was January and is on same amount of lasix and edema has not been as bad. Put her on Amoldipine and took her off of metoprolol. COPD Currently on advair as needed - has not needed albuterol in awhile. States it is expensive and does not get it like she should.        Review of Systems  Constitutional: Negative.  Negative for malaise/fatigue.  HENT: Positive for hearing loss and sinus pressure.   Eyes: Negative.   Respiratory: Negative.   Cardiovascular: Negative.  Negative for palpitations.  Gastrointestinal: Negative.   Endocrine: Positive for cold intolerance. Negative for polydipsia, polyphagia and polyuria.  Genitourinary: Negative.  Negative for vaginal discharge.  Musculoskeletal: Negative.   Skin: Negative.   Neurological: Negative.  Negative for headaches.  Hematological: Negative.   Psychiatric/Behavioral: Negative.   All other systems reviewed and are negative.      Objective:   Physical Exam  Constitutional: She is oriented to person, place, and time. She appears well-developed and  well-nourished.  HENT:  Right Ear: External ear normal.  Left Ear: External ear normal.  Nose: Nose normal.  Mouth/Throat: Oropharynx is clear and moist.  Eyes: EOM are normal.  Neck: Trachea normal, normal range of motion and full passive range of motion without pain. Neck supple. No JVD present. Carotid bruit is not present. No thyromegaly present.  Cardiovascular: Normal rate, regular rhythm, normal heart sounds and intact distal pulses.  Exam reveals no gallop and no friction rub.   No murmur heard. Pulmonary/Chest: Effort normal and breath sounds normal.  Abdominal: Soft. Bowel sounds are normal. She exhibits no distension and no mass. There is no tenderness.  Musculoskeletal: Normal range of motion. She exhibits edema (2+ bilaterally) and tenderness.       Right ankle: Normal.       Left ankle: Normal.       Arms: FROM of cervical spine without pain FROM of right shoulder with pain on abduction past 90 degrees Edema in both lower extremeties has improved.   Lymphadenopathy:    She has no cervical adenopathy.  Neurological: She is alert and oriented to person, place, and time. She has normal reflexes.  Multiple areas of tenderness up and down back  Skin: Skin is warm and dry.  Psychiatric: She has a normal mood and affect. Her behavior is normal. Judgment and thought content normal.    BP 129/77 mmHg  Pulse 73  Temp(Src) 96.7 F (35.9 C) (Oral)  Ht _0  (1.575 m)  Wt 223 lb (101.152 kg)  BMI 40.78 kg/m2   Results for orders placed or performed in visit on 09/21/15  POCT glycosylated hemoglobin (Hb A1C)  Result Value Ref Range   Hemoglobin A1C 6.5          Assessment & Plan:  1. Essential hypertension Do not add salt to diet - benazepril (LOTENSIN) 40 MG tablet; Take 1 tablet (40 mg total) by mouth daily.  Dispense: 90 tablet; Refill: 3 - amLODipine (NORVASC) 5 MG tablet; Take 1 tablet (5 mg total) by mouth daily.  Dispense: 180 tablet; Refill: 3 -  CMP14+EGFR  2. Simple chronic bronchitis (HCC) - Fluticasone-Salmeterol (ADVAIR) 100-50 MCG/DOSE AEPB; Inhale 1 puff into the lungs 2 (two) times daily.  Dispense: 3 each; Refill: 3  3. Gastroesophageal reflux disease without esophagitis Avoid spicy foods Do not eat 2 hours prior to bedtime - omeprazole (PRILOSEC) 40 MG capsule; TAKE 1 BY MOUTH DAILY --MARY-MARGARET Jeoffrey Eleazer  Dispense: 90 capsule; Refill: 1  4. Type 2 diabetes mellitus without complication, without long-term current use of insulin (HCC) Continue t watch carbs in diet - POCT glycosylated hemoglobin (Hb A1C) - Microalbumin / creatinine urine ratio  5. Fibromyalgia muscle pain exercise  6. GAD (generalized anxiety disorder) Stress managemnet  7. Morbid obesity, unspecified obesity type ([place]) Discussed diet and exercise for person with BMI >25 Will recheck weight in 3-6 months   8. Hyperlipidemia Low fat diet - atorvastatin (LIPITOR) 40 MG tablet; Take 1 tablet (40 mg total) by mouth daily.  Dispense: 90 tablet; Refill: 1 - Lipid panel  9. Bilateral leg edema Elevate ;egs when sitting - furosemide (LASIX) 40 MG tablet; TAKE 1 BY MOUTH EVERY MORNING AND 1/2 BY MOUTH AT 1 IN THE AFTERNOON  Dispense: 135 tablet; Refill: 1  10. Bilateral low back pain with sciatica, sciatica laterality unspecified - gabapentin (NEURONTIN) 300 MG capsule; Take 1 capsule (300 mg total) by mouth 2 (two) times daily.  Dispense: 60 capsule; Refill: 2  11. Tailbone injury, initial encounter - traMADol (ULTRAM) 50 MG tablet; Take 1 tablet (50 mg total) by mouth daily as needed.  Dispense: 30 tablet; Refill: 2    Labs pending Health maintenance reviewed Diet and exercise encouraged Continue all meds Follow up  In 3 months   [place], FNP

## 2015-09-21 NOTE — Addendum Note (Signed)
Addended by: Rolena Infante on: 09/21/2015 03:54 PM   Modules accepted: Orders

## 2015-09-21 NOTE — Addendum Note (Signed)
Addended by: Chevis Pretty on: 09/21/2015 03:42 PM   Modules accepted: Orders

## 2015-09-21 NOTE — Patient Instructions (Signed)
Diabetes and Foot Care Diabetes may cause you to have problems because of poor blood supply (circulation) to your feet and legs. This may cause the skin on your feet to become thinner, break easier, and heal more slowly. Your skin may become dry, and the skin may peel and crack. You may also have nerve damage in your legs and feet causing decreased feeling in them. You may not notice minor injuries to your feet that could lead to infections or more serious problems. Taking care of your feet is one of the most important things you can do for yourself.  HOME CARE INSTRUCTIONS  Wear shoes at all times, even in the house. Do not go barefoot. Bare feet are easily injured.  Check your feet daily for blisters, cuts, and redness. If you cannot see the bottom of your feet, use a mirror or ask someone for help.  Wash your feet with warm water (do not use hot water) and mild soap. Then pat your feet and the areas between your toes until they are completely dry. Do not soak your feet as this can dry your skin.  Apply a moisturizing lotion or petroleum jelly (that does not contain alcohol and is unscented) to the skin on your feet and to dry, brittle toenails. Do not apply lotion between your toes.  Trim your toenails straight across. Do not dig under them or around the cuticle. File the edges of your nails with an emery board or nail file.  Do not cut corns or calluses or try to remove them with medicine.  Wear clean socks or stockings every day. Make sure they are not too tight. Do not wear knee-high stockings since they may decrease blood flow to your legs.  Wear shoes that fit properly and have enough cushioning. To break in new shoes, wear them for just a few hours a day. This prevents you from injuring your feet. Always look in your shoes before you put them on to be sure there are no objects inside.  Do not cross your legs. This may decrease the blood flow to your feet.  If you find a minor scrape,  cut, or break in the skin on your feet, keep it and the skin around it clean and dry. These areas may be cleansed with mild soap and water. Do not cleanse the area with peroxide, alcohol, or iodine.  When you remove an adhesive bandage, be sure not to damage the skin around it.  If you have a wound, look at it several times a day to make sure it is healing.  Do not use heating pads or hot water bottles. They may burn your skin. If you have lost feeling in your feet or legs, you may not know it is happening until it is too late.  Make sure your health care provider performs a complete foot exam at least annually or more often if you have foot problems. Report any cuts, sores, or bruises to your health care provider immediately. SEEK MEDICAL CARE IF:   You have an injury that is not healing.  You have cuts or breaks in the skin.  You have an ingrown nail.  You notice redness on your legs or feet.  You feel burning or tingling in your legs or feet.  You have pain or cramps in your legs and feet.  Your legs or feet are numb.  Your feet always feel cold. SEEK IMMEDIATE MEDICAL CARE IF:   There is increasing redness,   swelling, or pain in or around a wound.  There is a red line that goes up your leg.  Pus is coming from a wound.  You develop a fever or as directed by your health care provider.  You notice a bad smell coming from an ulcer or wound.   This information is not intended to replace advice given to you by your health care provider. Make sure you discuss any questions you have with your health care provider.   Document Released: 07/29/2000 Document Revised: 04/03/2013 Document Reviewed: 01/08/2013 Elsevier Interactive Patient Education 2016 [employer] Inc.  

## 2015-09-22 LAB — CMP14+EGFR
ALBUMIN: 3.8 g/dL (ref 3.6–4.8)
ALT: 23 IU/L (ref 0–32)
AST: 35 IU/L (ref 0–40)
Albumin/Globulin Ratio: 1.8 (ref 1.1–2.5)
Alkaline Phosphatase: 80 IU/L (ref 39–117)
BILIRUBIN TOTAL: 2.2 mg/dL — AB (ref 0.0–1.2)
BUN / CREAT RATIO: 25 (ref 11–26)
BUN: 26 mg/dL (ref 8–27)
CALCIUM: 9.1 mg/dL (ref 8.7–10.3)
CHLORIDE: 98 mmol/L (ref 96–106)
CO2: 29 mmol/L (ref 18–29)
CREATININE: 1.06 mg/dL — AB (ref 0.57–1.00)
GFR, EST AFRICAN AMERICAN: 63 mL/min/{1.73_m2} (ref >59)
GFR, EST NON AFRICAN AMERICAN: 54 mL/min/{1.73_m2} — AB (ref >59)
GLUCOSE: 126 mg/dL — AB (ref 65–99)
Globulin, Total: 2.1 g/dL (ref 1.5–4.5)
Potassium: 4 mmol/L (ref 3.5–5.2)
Sodium: 144 mmol/L (ref 134–144)
TOTAL PROTEIN: 5.9 g/dL — AB (ref 6.0–8.5)

## 2015-09-22 LAB — LIPID PANEL
CHOL/HDL RATIO: 3.1 ratio (ref 0.0–4.4)
Cholesterol, Total: 122 mg/dL (ref 100–199)
HDL: 40 mg/dL (ref >39)
LDL CALC: 66 mg/dL (ref 0–99)
Triglycerides: 81 mg/dL (ref 0–149)
VLDL CHOLESTEROL CAL: 16 mg/dL (ref 5–40)

## 2015-09-22 LAB — MICROALBUMIN / CREATININE URINE RATIO
CREATININE, UR: 231.3 mg/dL
MICROALB/CREAT RATIO: 14.9 mg/g creat (ref 0.0–30.0)
MICROALBUM., U, RANDOM: 34.5 ug/mL

## 2015-09-28 ENCOUNTER — Other Ambulatory Visit: Payer: Self-pay

## 2015-09-28 MED ORDER — CYCLOBENZAPRINE HCL 5 MG PO TABS: ORAL_TABLET | ORAL | Status: DC

## 2015-11-02 ENCOUNTER — Ambulatory Visit: Payer: Medicare Other | Admitting: Pulmonary Disease

## 2015-11-18 ENCOUNTER — Ambulatory Visit (INDEPENDENT_AMBULATORY_CARE_PROVIDER_SITE_OTHER): Payer: PPO | Admitting: Pulmonary Disease

## 2015-11-18 ENCOUNTER — Encounter: Payer: Self-pay | Admitting: Pulmonary Disease

## 2015-11-18 VITALS — BP 128/72 | HR 75 | Temp 98.1°F | Ht 62.0 in | Wt 225.4 lb

## 2015-11-18 DIAGNOSIS — G4733 Obstructive sleep apnea (adult) (pediatric): Secondary | ICD-10-CM

## 2015-11-18 DIAGNOSIS — R0609 Other forms of dyspnea: Secondary | ICD-10-CM | POA: Diagnosis not present

## 2015-11-18 DIAGNOSIS — E669 Obesity, unspecified: Secondary | ICD-10-CM

## 2015-11-18 DIAGNOSIS — E668 Other obesity: Secondary | ICD-10-CM

## 2015-11-18 DIAGNOSIS — R5381 Other malaise: Secondary | ICD-10-CM | POA: Diagnosis not present

## 2015-11-18 DIAGNOSIS — J984 Other disorders of lung: Secondary | ICD-10-CM | POA: Diagnosis not present

## 2015-11-18 NOTE — Patient Instructions (Signed)
Today we updated your med list in our EPIC system...    Continue your current medications the same...  Keep up the good work w/ DIET & EXERCISE... The goal is to lose ~1 lb per week...  Use the new INCENTIVE SPIROMETER (a lung exerciser device) to help you get really good deep breaths...    Use it several times daily...  Check w/ your sleep doctor [facility]) about a new mask that will be better tolerated...  Call for any questions...  Let's plan a follow up visit in 4-6 months, sooner if needed for breathing problems.Marland KitchenMarland Kitchen

## 2015-11-18 NOTE — Progress Notes (Signed)
Subjective:     Patient ID: Stacey Summers, female   DOB: 11-27-1945, 70 y.o.   MRN: [id number]  HPI      Her PCP is Dr. Morrie Sheldon in [place], [place]...   ~  August 04, 2015:  Initial pulmonary consult w/ SN>        37 y/o WF, referred by DrCroitoru for a pulmonary ebvaluation due to dyspnea;  She is a difficult historian-  c/o SOB/ dyspnea w/ min activity and ADLs x yrs she says (eg- housework, "I do what I have to do", no change x yrs, "I push myself");  She also notes a mild cough- on & off, "I had polyps in my throat removed yrs ago", min sputum, no hemoptysis, ?chest discomfort- "I feel funny in my chest";  She went to [facility] in Nov w/ incr congestion after that due to the smoke she thinks;  She has not tried any OTC meds for her cough or SOB...       She is an ex-smoker having smoked for only 75yr betw [age]& 70y/o all <1ppd and quit ~1970;  She notes that she was told by DrMoore that she had asthma in the past, and treated for occas bronchitic infections, placed on ADVAIR100-2spBid;  She denies hx pneumonia, Tb or known exposures;  She worked for [employer] & was at 6 diff plants but no raw cotton dust exposure and no known asbestos exposure;  She does note some choking episodes esp while eating, she's had a prev EGD 2/14 by DrPatterson- +esophagitis & stricture dilated, mild antritis, neg HPylori, rec to continue PPI & she is on Protonix40/d...       She has been evaluated for OSA by DrKelly> She c/o snoring, non-restorative sleep, difficulty falluing asleep & maintaining sleep; PSG 04/16/13 showed AHI=38.7/hr (all hypopneas) & 52/hr during REM, RDI= 45/hr & 57/hr during REM, there was O2 desat to 75%, no signif PLMS;  She subseq had a CPAP titration but I could not locate this report- she [facility] subsequently [facility];  Download in [id number] 25/30 days, usually 6+hrs per day, good control w/ AHI<1... There are no recent downloads in Epic, she indicates that she stopped using  the bipap on her own over the last yr, DrC has encouraged her to use it nightly...       She is followed for CARDS by DrCroitoru (notes reviewed)> HBP, mult coronary risk factors, heart murmur, palpitations, +FamHx of sudden death, HL, Obesity, Fatigue, VI/ edema      She is followed by her PCP, DrMoore>  HBP, DM, HL, Obesity, edema, fatigue (she thinks it's fibromyalgia), back pain, venous insuffic w/ edema EXAM shows Afeb, VSS , O2sat=98% on RA; she is 230#, 5'2"Tall, BMI=42; HEENT- neg, mallampati2;  Chest- clear w/o w/r/r;  Heart- RR gr1-2/6 SEM w/o r/g;  Abd- obese soft neg;  Ext- VI, tr edema w/o c/c; Neuro- intact...   CXR 08/04/15 showed norm heart size, clear lungs, NAD...  Spirometry 08/04/15 showed FVC=1.87 (69%), FEV1=1.51 (72%), %1sec=81%, mid-flows wnl at 88% predicted... C/w mild-mod restriction...  Ambulatory oximetry> O2sat=98% on RA at rest w/ pulse=76/min; she walked 3laps in office today w/ lowest O2sat=96% w/ pulse=92/min... IMP/PLAN>>  Stacey Summers's dyspnea appears to be multifactorial w/ components from pulm restriction due to obesity, deconditioning, OSA (not using CPAP regularly), cardiac factors;  Her cough is likely related to her reflux/ GERD/ choking episodes & she needs a vigorous antireflux regimen, plus  her PPI, and trial of Tramadol to suppress the cough (may need to STOP her ACE rx later);  Overall improvement in her dyspnea is going to require her to get on a diet, increase her exercise program, and get her weight down but she does not appear very motivated;  She also needs to get back on her BiPAP for her OSA- I suggested to her to f/u w/ Quentin Ore who did her initial sleep eval... ROV in 2-56moto check her progress.  ~  November 18, 2015:  3-457moOV & pulmonary follow up w/ SN>      DoSamarieeports that her symptoms are about the same over the last few months;  We felt that her DYSPNEA was multifactorial (as above) & we encouraged diet, exercise & wt reduction;  Wt is down 5# to  225# today & BMI=41- we reviewed diet recommendations;  She has not joined [employer] is not really exercising "I can't due to my fibromyalgia" and she notes that she no longer sings in church due to "I don't have the breath";  For her cough she has been using Tramadol50 prn & a more vigorous antireflux regimen- this seems to help;  She has OSA, not using her BiPAP, and she has a follow up appt w/ DrKelly soon... We reviewed the following medical problems during today's office visit >>     Dyspnea> multifactorial w/ components from pulm restriction due to obesity, deconditioning/sedentary, OSA w/ BiPAP noncompliance, cardiac factors;  Her pulm eval is benign other than some expected restriction from her obesity; ?if she's had any improvement on the Advair100?  In my opinion she will need to put in the hard work of DIET/ EXERCISE/ WT REDUCTION to see any improvement in her breathing...    Cough> this is most likely related to her GERD, reflux, choking episodes and she may need GI f/u; we reviewed antireflux regimen- Prilosec40 taken 3056mbefore the eve meal, NPO after dinner, elev HOB 6" blocks; continue Tramadol50 prn.    Hx OSA on BiPAP per DrKelly> she has not been using the BiPAP & it appears as though she hasn't had a sleep f/u appt in some time; suggest that she [facility]...    Morbid Obesity> Wt is down 5# to 225# & BMI=41; she needs to put in the hard work w/ DIET/ EXERCISE/ WT REDUCTION as discussed...     Hx Esophagitis, GERD/reflux, stricture> she may need GI f/u but for now we reviewed Prilosec40 about 56m21mefore the eve meal, NPO after dinner, elev HOB 6" blocks...    Cardiac Hx> HBP, HxCP, HxPalpit, mult cardiac risk factors, +FamHx sudden cardiac death- followed by DrCroitoru...    Medical Hx> DM, Obesity, GERD, Divertics/ polyps, Back pain, Leg pain, Fatigue, Venous Insuffic/ edema- followed by Dr. Don Morrie Sheldonal... EXAM shows Afeb, VSS ,  O2sat=96% on RA; she is 225#, 5'2"Tall, BMI=41; HEENT- neg, mallampati2;  Chest- clear w/o w/r/r;  Heart- RR gr1-2/6 SEM w/o r/g;  Abd- obese soft neg;  Ext- VI, tr edema w/o c/c; Neuro- intact...  IMP/PLAN>>  DonnLagena+- stable but nothing really has changed;  We reviewed the recommendation for diet, exercise, wt reduction;  Rec to use IS device for "lung exerciser";  She will f/u w/ DrKelly for her sleep apnea...     Past Medical History  Diagnosis Date  . Asthma >> on ADVAIR100-2spBid per DrMoore     bronchitis  .  Allergy     seasonal  . Arthritis   . GERD (gastroesophageal reflux disease) >> on Prilosec40   . Hyperlipidemia >> on Lipitor40   . Hypertension >> on Amlod5, Lotensin40, Lasix40am&20pm, Zaroxyln2.5 2d/wk   . Tubular adenoma of colon 05/09/12    "innumerable number" of flat polyps  . DJD (degenerative joint disease) >> on Neurontin300Bid & Flexeril5Tid prn   . Anxiety >> on Xanax0.25 prn   . Cataract   . History of abnormal mammogram     Past Surgical History  Procedure Laterality Date  . Tonsillectomy  1965  . Cholecystectomy  1990  . Cesarean section  1977  . Tumor removal      benign fatty tumors on abdomen  . Polypectomy      vocal cord  . Larynx surgery      POLYP REMOVAL  . Carpal tunnel release Left 10/20/14    [facility]    Outpatient Encounter Prescriptions as of 11/18/2015  Medication Sig  . ALPRAZolam (XANAX) 0.25 MG tablet Take 1 tablet (0.25 mg total) by mouth 2 (two) times daily as needed.  Marland Kitchen amLODipine (NORVASC) 5 MG tablet Take 1 tablet (5 mg total) by mouth daily.  Marland Kitchen aspirin 81 MG tablet Take 81 mg by mouth as needed for pain.  Marland Kitchen atorvastatin (LIPITOR) 40 MG tablet Take 1 tablet (40 mg total) by mouth daily.  . benazepril (LOTENSIN) 40 MG tablet Take 1 tablet (40 mg total) by mouth daily.  . Blood Glucose Monitoring Suppl (LIBERTY BLOOD GLUCOSE MONITOR) DEVI 1 each by Does not apply route daily. Test 1x per day and prn-dx250.02  . Calcium-Vitamin  D-Vitamin K (CALCIUM SOFT CHEWS) 500-100-40 MG-UNT-MCG CHEW Chew 1 each by mouth daily.  . cholecalciferol (VITAMIN D) 1000 UNITS tablet Take 1,000 Units by mouth daily.   . cyclobenzaprine (FLEXERIL) 5 MG tablet TAKE 1 BY MOUTH 3 TIMES DAILY AS NEEDED FOR MUSCLE SPASMS  --Stacey Collum NP  . Fluticasone-Salmeterol (ADVAIR) 100-50 MCG/DOSE AEPB Inhale 1 puff into the lungs 2 (two) times daily.  . furosemide (LASIX) 40 MG tablet TAKE 1 BY MOUTH EVERY MORNING AND 1/2 BY MOUTH AT 1 IN THE AFTERNOON  . gabapentin (NEURONTIN) 300 MG capsule Take 1 capsule (300 mg total) by mouth 2 (two) times daily.  Marland Kitchen glucose blood test strip Test 1x per day and prn- dx 250.02  . metolazone (ZAROXOLYN) 2.5 MG tablet 1 po 2x a week as needed  . omeprazole (PRILOSEC) 40 MG capsule TAKE 1 BY MOUTH DAILY --[name]  . ONETOUCH DELICA LANCETS 32Q MISC 1 each by Does not apply route daily. Test 1X per day and prn-- dx 250.02  . traMADol (ULTRAM) 50 MG tablet Take 1 tablet (50 mg total) by mouth daily as needed.   No facility-administered encounter medications on file as of 11/18/2015.    No Known Allergies   Immunization History  Administered Date(s) Administered  . Influenza,inj,Quad PF,36+ Mos 05/28/2014, 06/16/2015  . Pneumococcal Conjugate-13 07/21/2014  . Pneumococcal Polysaccharide-23 09/21/2015    Current Medications, Allergies, Past Medical History, Past Surgical History, Family History, and Social History were reviewed in [employer] record.   Review of Systems             All symptoms NEG except where BOLDED >>  Constitutional:  F/C/S, fatigue, anorexia, unexpected weight change. HEENT:  HA, visual changes, hearing loss, earache, nasal symptoms, sore throat, mouth sores, hoarseness. Resp:  cough, sputum, hemoptysis; SOB, tightness, wheezing. Cardio:  CP, palpit, DOE, orthopnea, edema. GI:  N/V/D/C, blood in stool; reflux, abd pain, distention, gas. GU:  dysuria,  freq, urgency, hematuria, flank pain, voiding difficulty. MS:  joint pain, swelling, tenderness, decr ROM; neck pain, back pain, etc. Neuro:  HA, tremors, seizures, dizziness, syncope, weakness, numbness, gait abn. Skin:  suspicious lesions or skin rash. Heme:  adenopathy, bruising, bleeding. Psyche:  confusion, agitation, sleep disturbance, hallucinations, anxiety, depression suicidal.   Objective:   Physical Exam       Vital Signs:  Reviewed...  General:  WD, Obese, 70 y/o WM in NAD; alert & oriented; pleasant & cooperative... HEENT:  [place]/AT; Conjunctiva- pink, Sclera- nonicteric, EOM-wnl, PERRLA, EACs-clear, TMs-wnl; NOSE-clear; THROAT-clear & wnl. Neck:  Supple w/ fair ROM; no JVD; normal carotid impulses w/o bruits; no thyromegaly or nodules palpated; no lymphadenopathy. Chest:  Clear to P & A; without wheezes, rales, or rhonchi heard. Heart:  Regular Rhythm; norm S1 & S2 Gr1-2/6 SEM w/o rubs or gallops detected. Abdomen:  Obese, soft & nontender- no guarding or rebound; normal bowel sounds; no organomegaly or masses palpated. Ext:  decr ROM; without deformities +arthritic changes; no varicose veins, +venous insuffic, tr edema;  Pulses intact w/o bruits. Neuro:  CNs II-XII intact; motor testing normal; no focal neuro deficit Derm:  No lesions noted; no rash etc. Lymph:  No cervical, supraclavicular, axillary, or inguinal adenopathy palpated.   Assessment:      IMP>>      Dyspnea> multifactorial w/ components from pulm restriction due to obesity, deconditioning/sedentary, OSA w/ BiPAP noncompliance, cardiac factors;  Her pulm eval is benign other than some expected restriction from her obesity; ?if she's had any improvement on the Advair100?  In my opinion she will need to put in the hard work of DIET/ EXERCISE/ WT REDUCTION to see any improvement in her breathing...    Cough> this is most likely related to her GERD, reflux, choking episodes and she may need GI f/u soon; for now we  reviewed antireflux regimen- Prilosec40 taken 55mn before the eve meal, NPO after dinner, elev HOB 6" blocks;  We will try TRAMADOL565mTid prn for the cough...    Hx OSA on BiPAP per DrKelly> she has not been using the BiPAP & it appears as though she hasn't had a sleep f/u appt in some time; suggest that she [facility]...    Morbid Obesity> she needs to put in the hard work w/ DIET/ EXERCISE/ WT REDUCTION as discussed...     Hx Esophagitis, GERD/reflux, stricture> she may need GI f/u soon but for now we reviewed Prilosec40 about 3056mbefore the eve meal, NPO after dinner, elev HOB 6" blocks...    Cardiac Hx> HBP, HxCP, HxPalpit, mult cardiac risk factors, +FamHx sudden cardiac death- followed by DrCroitoru...    Medical Hx> DM, Obesity, GERD, Divertics/ polyps, Back pain, Leg pain, Fatigue, Venous Insuffic/ edema- followed by Dr. DonMorrie Sheldon al...  PLAN>>   08/04/15>   Stacey Summers's dyspnea appears to be multifactorial w/ components from pulm restriction due to obesity, deconditioning, OSA (not using CPAP regularly), cardiac factors;  Her cough is likely related to her reflux/ GERD/ choking episodes & she needs a vigorous antireflux regimen, plus her PPI, and trial of Tramadol to suppress the cough (may need to STOP her ACE rx later);  Overall improvement in her dyspnea is going to require her to get on a diet, increase her exercise program, and get her weight down  but she does not appear very motivated;  She also needs to get back on her BiPAP for her OSA- I suggested to her to f/u w/ Quentin Ore who did her initial sleep eval...  11/18/15>   Stacey Summers is +- stable but nothing really has changed;  We reviewed the recommendation for diet, exercise, wt reduction;  Rec to use IS device for "lung exerciser";  She will f/u w/ DrKelly for her sleep apnea...       Plan:     Patient's Medications  New Prescriptions   No medications on file  Previous Medications   ALPRAZOLAM (XANAX)  0.25 MG TABLET    Take 1 tablet (0.25 mg total) by mouth 2 (two) times daily as needed.   AMLODIPINE (NORVASC) 5 MG TABLET    Take 1 tablet (5 mg total) by mouth daily.   ASPIRIN 81 MG TABLET    Take 81 mg by mouth as needed for pain.   ATORVASTATIN (LIPITOR) 40 MG TABLET    Take 1 tablet (40 mg total) by mouth daily.   BENAZEPRIL (LOTENSIN) 40 MG TABLET    Take 1 tablet (40 mg total) by mouth daily.   BLOOD GLUCOSE MONITORING SUPPL (LIBERTY BLOOD GLUCOSE MONITOR) DEVI    1 each by Does not apply route daily. Test 1x per day and prn-dx250.02   CALCIUM-VITAMIN D-VITAMIN K (CALCIUM SOFT CHEWS) 500-100-40 MG-UNT-MCG CHEW    Chew 1 each by mouth daily.   CHOLECALCIFEROL (VITAMIN D) 1000 UNITS TABLET    Take 1,000 Units by mouth daily.    CYCLOBENZAPRINE (FLEXERIL) 5 MG TABLET    TAKE 1 BY MOUTH 3 TIMES DAILY AS NEEDED FOR MUSCLE SPASMS  --MARY MARTIN NP   FLUTICASONE-SALMETEROL (ADVAIR) 100-50 MCG/DOSE AEPB    Inhale 1 puff into the lungs 2 (two) times daily.   FUROSEMIDE (LASIX) 40 MG TABLET    TAKE 1 BY MOUTH EVERY MORNING AND 1/2 BY MOUTH AT 1 IN THE AFTERNOON   GABAPENTIN (NEURONTIN) 300 MG CAPSULE    Take 1 capsule (300 mg total) by mouth 2 (two) times daily.   GLUCOSE BLOOD TEST STRIP    Test 1x per day and prn- dx 250.02   METOLAZONE (ZAROXOLYN) 2.5 MG TABLET    1 po 2x a week as needed   OMEPRAZOLE (PRILOSEC) 40 MG CAPSULE    TAKE 1 BY MOUTH DAILY --[name]   ONETOUCH DELICA LANCETS 69V MISC    1 each by Does not apply route daily. Test 1X per day and prn-- dx 250.02   TRAMADOL (ULTRAM) 50 MG TABLET    Take 1 tablet (50 mg total) by mouth daily as needed.  Modified Medications   No medications on file  Discontinued Medications   No medications on file

## 2016-01-20 ENCOUNTER — Encounter: Payer: Self-pay | Admitting: Cardiovascular Disease

## 2016-01-20 ENCOUNTER — Ambulatory Visit (INDEPENDENT_AMBULATORY_CARE_PROVIDER_SITE_OTHER): Payer: PPO | Admitting: Cardiovascular Disease

## 2016-01-20 DIAGNOSIS — E668 Other obesity: Secondary | ICD-10-CM

## 2016-01-20 DIAGNOSIS — I493 Ventricular premature depolarization: Secondary | ICD-10-CM | POA: Diagnosis not present

## 2016-01-20 DIAGNOSIS — G4733 Obstructive sleep apnea (adult) (pediatric): Secondary | ICD-10-CM | POA: Diagnosis not present

## 2016-01-20 DIAGNOSIS — E785 Hyperlipidemia, unspecified: Secondary | ICD-10-CM

## 2016-01-20 DIAGNOSIS — J984 Other disorders of lung: Secondary | ICD-10-CM

## 2016-01-20 DIAGNOSIS — E669 Obesity, unspecified: Secondary | ICD-10-CM

## 2016-01-20 DIAGNOSIS — R600 Localized edema: Secondary | ICD-10-CM

## 2016-01-20 DIAGNOSIS — I10 Essential (primary) hypertension: Secondary | ICD-10-CM

## 2016-01-20 NOTE — Patient Instructions (Signed)
Your physician recommends that you continue on your current medications as directed. Please refer to the Current Medication list given to you today.  Dr Sallyanne Kuster recommends that you schedule a follow-up appointment in 1 year. You will receive a reminder letter in the mail two months in advance. If you don't receive a letter, please call our office to schedule the follow-up appointment.  If you need a refill on your cardiac medications before your next appointment, please call your pharmacy.

## 2016-01-20 NOTE — Progress Notes (Signed)
Patient ID: Stacey Summers, female   DOB: 1945-09-13, 70 y.o.   MRN: [id number]    Cardiology Office Note    Date:  01/22/2016   ID:  EMMILYNN Summers, DOB April 04, 1946, MRN [id number]  PCP:  Chevis Pretty, FNP  Cardiologist:   Sanda Klein, MD   Chief Complaint  Patient presents with  . Follow-up    6 months  pt c/o occasional chest discomfort, SOB, weakness, fatigue, and occasional swelling in legs/feet/ankles  . CPAP questions    History of Present Illness:  Stacey Summers is a 70 y.o. female with a history of morbid obesity obstructive sleep apnea, essential hypertension, leg edema and hypercholesterolemia. She is here for routine follow-up and has not had new serious health challenges since her last appointment. She mostly complains of pain in her back and her hip which are very stiff every morning, gradually improving as the day wears on. She does not have palpitations, dizziness, syncope, angina or resting dyspnea but does describe some degree of fatigue. Functional class II exertional dyspnea is present and unchanged. She has mild ankle swelling towards the end of the day, resolving overnight.  Her electrocardiogram shows very frequent PVCs but she does not appear to be aware of them today.  She was seen in the pulmonary clinic in April by Dr. Teressa Lower who felt that her dyspnea was multifactorial related to restrictive ventilatory defect from obesity, deconditioning, inconsistent use of sleep apnea treatment as well as cardiac factors. He felt that her cough at that time was likely related to gastroesophageal reflux. Lillianna tells me that Dr. Lenna Gilford suggested she use nasal pillows to improve her compliance with CPAP.  Past Medical History  Diagnosis Date  . Asthma     bronchitis  . Allergy     seasonal  . Arthritis   . GERD (gastroesophageal reflux disease)   . Hyperlipidemia   . Hypertension   . Tubular adenoma of colon 05/09/12    "innumerable number" of flat  polyps  . DJD (degenerative joint disease)   . Anxiety   . Cataract   . History of abnormal mammogram     Past Surgical History  Procedure Laterality Date  . Tonsillectomy  1965  . Cholecystectomy  1990  . Cesarean section  1977  . Tumor removal      benign fatty tumors on abdomen  . Polypectomy      vocal cord  . Larynx surgery      POLYP REMOVAL  . Carpal tunnel release Left 10/20/14    [facility]    Current Medications: Outpatient Prescriptions Prior to Visit  Medication Sig Dispense Refill  . ALPRAZolam (XANAX) 0.25 MG tablet Take 1 tablet (0.25 mg total) by mouth 2 (two) times daily as needed. 30 tablet 2  . amLODipine (NORVASC) 5 MG tablet Take 1 tablet (5 mg total) by mouth daily. 180 tablet 3  . aspirin 81 MG tablet Take 81 mg by mouth as needed for pain.    Marland Kitchen atorvastatin (LIPITOR) 40 MG tablet Take 1 tablet (40 mg total) by mouth daily. 90 tablet 1  . benazepril (LOTENSIN) 40 MG tablet Take 1 tablet (40 mg total) by mouth daily. 90 tablet 3  . Blood Glucose Monitoring Suppl (LIBERTY BLOOD GLUCOSE MONITOR) DEVI 1 each by Does not apply route daily. Test 1x per day and prn-dx250.02 1 each 0  . Calcium-Vitamin D-Vitamin K (CALCIUM SOFT CHEWS) 500-100-40 MG-UNT-MCG CHEW Chew 1 each by mouth daily.    Marland Kitchen  cholecalciferol (VITAMIN D) 1000 UNITS tablet Take 1,000 Units by mouth daily.     . cyclobenzaprine (FLEXERIL) 5 MG tablet TAKE 1 BY MOUTH 3 TIMES DAILY AS NEEDED FOR MUSCLE SPASMS  --MARY MARTIN NP 30 tablet 0  . Fluticasone-Salmeterol (ADVAIR) 100-50 MCG/DOSE AEPB Inhale 1 puff into the lungs 2 (two) times daily. 3 each 3  . furosemide (LASIX) 40 MG tablet TAKE 1 BY MOUTH EVERY MORNING AND 1/2 BY MOUTH AT 1 IN THE AFTERNOON 135 tablet 1  . gabapentin (NEURONTIN) 300 MG capsule Take 1 capsule (300 mg total) by mouth 2 (two) times daily. 60 capsule 2  . glucose blood test strip Test 1x per day and prn- dx 250.02 100 each 2  . metolazone (ZAROXOLYN) 2.5 MG tablet 1 po 2x a week  as needed 10 tablet 1  . omeprazole (PRILOSEC) 40 MG capsule TAKE 1 BY MOUTH DAILY --[name] 90 capsule 1  . ONETOUCH DELICA LANCETS 73S MISC 1 each by Does not apply route daily. Test 1X per day and prn-- dx 250.02 100 each 11  . traMADol (ULTRAM) 50 MG tablet Take 1 tablet (50 mg total) by mouth daily as needed. 30 tablet 2   No facility-administered medications prior to visit.     Allergies:   Review of patient's allergies indicates no known allergies.   Social History   Social History  . Marital Status: Married    Spouse Name: N/A  . Number of Children: 3  . Years of Education: N/A   Occupational History  . retired    Social History Main Topics  . Smoking status: Former Smoker -- 1.00 packs/day for 3 years    Types: Cigarettes    Quit date: 08/15/1969  . Smokeless tobacco: Never Used  . Alcohol Use: No  . Drug Use: No  . Sexual Activity: Yes   Other Topics Concern  . None   Social History Narrative     Family History:  The patient's family history includes Brain cancer (age of onset: 26) in her cousin; Cancer in her brother, father, other, paternal aunt, and paternal uncle; Colon cancer in her father, other, and paternal uncle; Colon cancer (age of onset: 26) in her brother; Colon polyps in her brother; Heart attack (age of onset: 37) in her father; Heart disease in her mother; Heart failure in her maternal grandmother, maternal uncle, and mother; Kidney cancer in her cousin; Other in her brother and paternal uncle; Other (age of onset: 71) in her other; Prostate cancer in her father; Spina bifida in her brother; Stomach cancer in her paternal aunt. There is no history of Esophageal cancer or Rectal cancer.   ROS:   Please see the history of present illness.    ROS All other systems reviewed and are negative.   PHYSICAL EXAM:   VS:  BP 110/68 mmHg  Pulse 75  Ht 5' 2"  (1.575 m)  Wt 103.42 kg (228 lb)  BMI 41.69 kg/m2   GEN: Elderly obese, well  developed, in no acute distress HEENT: normal Neck: no JVD, carotid bruits, or masses Cardiac: RRR; no murmurs, rubs, or gallops, trace bilateral ankle edema  Respiratory:  clear to auscultation bilaterally, normal work of breathing GI: soft, nontender, nondistended, + BS MS: no deformity or atrophy Skin: warm and dry, no rash Neuro:  Alert and Oriented x 3, Strength and sensation are intact Psych: euthymic mood, full affect  Wt Readings from Last 3 Encounters:  01/20/16 103.42 kg (228 lb)  11/18/15 102.241 kg (225 lb 6.4 oz)  09/21/15 101.152 kg (223 lb)      Studies/Labs Reviewed:   EKG:  EKG is ordered today.  The ekg ordered today demonstrates Normal sinus rhythm with frequent PVCs  Recent Labs: 09/21/2015: ALT 23; BUN 26; Creatinine, Ser 1.06*; Potassium 4.0; Sodium 144   Lipid Panel    Component Value Date/Time   CHOL 122 09/21/2015 1455   CHOL 140 12/25/2014 1020   CHOL 162 12/20/2012 1044   TRIG 81 09/21/2015 1455   TRIG 91 12/25/2014 1020   TRIG 83 12/20/2012 1044   HDL 40 09/21/2015 1455   HDL 42 12/25/2014 1020   HDL 42 12/20/2012 1044   CHOLHDL 3.1 09/21/2015 1455   LDLCALC 66 09/21/2015 1455   LDLCALC 59 01/29/2014 1031   LDLCALC 57 01/29/2014   LDLCALC 103* 12/20/2012 1044     ASSESSMENT:    1. Morbid obesity, unspecified obesity type ([place])   2. Essential hypertension   3. OSA treated with BiPAP   4. Restrictive lung disease secondary to obesity   5. Hyperlipidemia   6. Bilateral leg edema      PLAN:  In order of problems listed above:  1. Morbid obesity: Most if not all of Grey's major medical problems can be traced back to obesity. Discussed the importance of weight loss, regular physical exercise, calorie and carbohydrate restriction. 2. HTN: blood pressure is well controlled on the current regimen. She is having more PVCs after we discontinued the beta blocker for complaints of fatigue). On the other hand her fatigue is not improved. I  don't think her fatigue will get better unless she uses CPAP consistently.  3. OSA: Strongly recommend 100% compliance with BiPAP/CPAP. If nasal pillows worked for her think we should encourage the switch. Asked Dr. Evette Georges nurse, Mariann Laster to help with the supplies. 4. At risk for development of pulmonary hypertension and right heart failure due to sleep apnea and obesity. 5. HLP: Excellent lipid profile on atorvastatin 6. Mild ankle swelling can be attributed to the amlodipine and possibly some degree of cor pulmonale. The echo performed in 2014 did not allow estimation of pulmonary artery pressure.     Medication Adjustments/Labs and Tests Ordered: Current medicines are reviewed at length with the patient today.  Concerns regarding medicines are outlined above.  Medication changes, Labs and Tests ordered today are listed in the Patient Instructions below. Patient Instructions  Your physician recommends that you continue on your current medications as directed. Please refer to the Current Medication list given to you today.  Dr Sallyanne Kuster recommends that you schedule a follow-up appointment in 1 year. You will receive a reminder letter in the mail two months in advance. If you don't receive a letter, please call our office to schedule the follow-up appointment.  If you need a refill on your cardiac medications before your next appointment, please call your pharmacy.    Signed, Sanda Klein, MD  01/22/2016 12:30 PM    [facility] [address], [place], [place]  [address] Phone: [phone]; Fax: [phone]

## 2016-01-22 ENCOUNTER — Other Ambulatory Visit: Payer: Self-pay | Admitting: *Deleted

## 2016-01-22 DIAGNOSIS — G4733 Obstructive sleep apnea (adult) (pediatric): Secondary | ICD-10-CM

## 2016-01-22 NOTE — Progress Notes (Signed)
Nasal pillows with supplies and chin strap ordered from [facility]. Download in 30 days also requested.

## 2016-02-23 ENCOUNTER — Other Ambulatory Visit: Payer: Self-pay | Admitting: Nurse Practitioner

## 2016-02-24 NOTE — Telephone Encounter (Signed)
Last seen 09/21/15  MMM If approved route to nurse to call into [facility]

## 2016-02-24 NOTE — Telephone Encounter (Signed)
rx ready for pick up Last refill without being seen

## 2016-02-24 NOTE — Telephone Encounter (Signed)
Patient notified rx up front and ready for pick up

## 2016-03-09 ENCOUNTER — Telehealth: Payer: Self-pay | Admitting: Cardiovascular Disease

## 2016-03-09 NOTE — Telephone Encounter (Signed)
Patient states she cannot use her current CPAP - blows air in her eyes, had cataracts taken off her eyes. She currently has a full mask that goes over face/nose.   Patient states she was told to get in touch with Dr. Claiborne Billings to get the supplies for her CPAP that has the hose that just goes to her nose.   Lauralee Evener, CMA at 01/22/2016 3:25 PM   Status: Signed    Nasal pillows with supplies and chin strap ordered from [facility]. Download in 30 days also requested.     Patient uses home care agency in [place] - she thinks it is [facility]. Advised that she contact them about the supplies that were ordered.

## 2016-03-09 NOTE — Telephone Encounter (Signed)
New message        The pt is calling a Cpap machine part Dr. Loletha Grayer told the pt he was going to with Dr.Kelly

## 2016-03-10 ENCOUNTER — Telehealth: Payer: Self-pay | Admitting: Cardiovascular Disease

## 2016-03-10 NOTE — Telephone Encounter (Signed)
Pt calling,says she need a nose hose.She still have not received it,where was it called to?

## 2016-03-10 NOTE — Telephone Encounter (Signed)
Returned pt call-pt reports she called [facility] and was told they have not received the orders for CPAP supplies ("nose hose").    Will route to [place] CMA to make aware.

## 2016-03-16 ENCOUNTER — Other Ambulatory Visit: Payer: Self-pay | Admitting: *Deleted

## 2016-03-16 DIAGNOSIS — Z1231 Encounter for screening mammogram for malignant neoplasm of breast: Secondary | ICD-10-CM

## 2016-03-23 NOTE — Telephone Encounter (Signed)
Follow up  Call  Stacey Summers is calling about cpap supplie ( nose hose)

## 2016-03-24 ENCOUNTER — Ambulatory Visit: Payer: Medicare Other | Admitting: Pulmonary Disease

## 2016-03-24 ENCOUNTER — Telehealth: Payer: Self-pay | Admitting: Cardiovascular Disease

## 2016-03-24 NOTE — Telephone Encounter (Signed)
Spoke with the [facility] and [facility]. [facility] states they do not see June order in the [place]. They told me that I will have to send a new order. They could not process orders over 30 days. New order for nasal pillows with supplies faxed to [phone]. Patient notified.

## 2016-03-24 NOTE — Telephone Encounter (Signed)
See note regarding CPAP supplies. Routed to [place].

## 2016-03-24 NOTE — Telephone Encounter (Signed)
New message ° ° ° ° °Pt returning nurse call. Please call.  °

## 2016-03-30 ENCOUNTER — Other Ambulatory Visit: Payer: Self-pay | Admitting: Nurse Practitioner

## 2016-03-30 DIAGNOSIS — M5442 Lumbago with sciatica, left side: Principal | ICD-10-CM

## 2016-03-30 DIAGNOSIS — M5441 Lumbago with sciatica, right side: Secondary | ICD-10-CM

## 2016-03-31 NOTE — Telephone Encounter (Signed)
Last refill without being seen 

## 2016-04-14 ENCOUNTER — Ambulatory Visit: Payer: PPO | Admitting: Pulmonary Disease

## 2016-04-18 ENCOUNTER — Other Ambulatory Visit: Payer: Self-pay | Admitting: Nurse Practitioner

## 2016-04-18 DIAGNOSIS — E785 Hyperlipidemia, unspecified: Secondary | ICD-10-CM

## 2016-04-19 NOTE — Telephone Encounter (Signed)
Last refill without being seen 

## 2016-04-26 ENCOUNTER — Ambulatory Visit: Payer: PPO | Admitting: Pulmonary Disease

## 2016-05-11 ENCOUNTER — Telehealth: Payer: Self-pay | Admitting: Cardiovascular Disease

## 2016-05-11 NOTE — Telephone Encounter (Signed)
Returned call. Patient notes she got a nasal CPAP mask to replace original, but needs settings changed on her machine d/t the pressure settings being too high and blowing air out of top of mask. A fax was sent in by DME supplier to request changes by Dr. Claiborne Billings. Pt wanting to see if this can be followed up on. Aware I will send to Dr. Claiborne Billings for review.

## 2016-05-11 NOTE — Telephone Encounter (Signed)
New message      Pt has a question regarding her CPAP machine

## 2016-05-17 ENCOUNTER — Telehealth: Payer: Self-pay | Admitting: Cardiovascular Disease

## 2016-05-17 NOTE — Telephone Encounter (Signed)
New message    Pt calling to speak to the nurse about a sleep mask. Please call.

## 2016-05-17 NOTE — Telephone Encounter (Signed)
Returned call to patient.She stated she needs a order from [facility] to reduce air flow on nasal mask for cpap.Stated she was fitted for nasal mask and air flow too strong.Stated a order was faxed to [facility].Advised Dr.Kelly out of office.I will send message to him for advice.

## 2016-05-20 ENCOUNTER — Telehealth: Payer: Self-pay | Admitting: Pulmonary Disease

## 2016-05-20 NOTE — Telephone Encounter (Signed)
Last ov on 11/18/15 with SN Patient Instructions by Noralee Space, MD at 11/18/2015 11:16 AM   Author: Noralee Space, MD Author Type: Physician Filed: 11/18/2015 11:19 AM  Note Status: Signed Cosign: Cosign Not Required Encounter Date: 11/18/2015  Editor: Noralee Space, MD (Physician)    Today we updated your med list in our EPIC system...    Continue your current medications the same...  Keep up the good work w/ DIET & EXERCISE... The goal is to lose ~1 lb per week...  Use the new INCENTIVE SPIROMETER (a lung exerciser device) to help you get really good deep breaths...    Use it several times daily...  Check w/ your sleep doctor [facility]) about a new mask that will be better tolerated...  Call for any questions...  Let's plan a follow up visit in 4-6 months, sooner if needed for breathing problems...     Called spoke with pt. She states that she has had issues with Dr. Evette Georges office trying to get a new mask. She cancels the appointments with SN because she felt that the issues with Dr. Claiborne Billings needed to be addressed before seeing SN. I explained to her that she needs to keep ov with SN for 101/11/17. She voiced understanding and had no further questions.

## 2016-05-22 NOTE — Telephone Encounter (Signed)
Did she have a download so that we know what settings are and if need to be reduced

## 2016-05-23 ENCOUNTER — Ambulatory Visit (HOSPITAL_COMMUNITY)
Admission: RE | Admit: 2016-05-23 | Discharge: 2016-05-23 | Disposition: A | Discharge status: Home / Self Care | Payer: PPO | Source: Ambulatory Visit | Attending: Nurse Practitioner | Admitting: Nurse Practitioner

## 2016-05-23 ENCOUNTER — Ambulatory Visit (HOSPITAL_COMMUNITY): Payer: Medicare Other

## 2016-05-23 DIAGNOSIS — Z1231 Encounter for screening mammogram for malignant neoplasm of breast: Secondary | ICD-10-CM

## 2016-05-24 NOTE — Telephone Encounter (Signed)
Returned call to patient.She stated settings were faxed to our office.Advised I will send message to [place].

## 2016-05-25 ENCOUNTER — Ambulatory Visit (INDEPENDENT_AMBULATORY_CARE_PROVIDER_SITE_OTHER): Payer: PPO | Admitting: Pulmonary Disease

## 2016-05-25 ENCOUNTER — Encounter: Payer: Self-pay | Admitting: Pulmonary Disease

## 2016-05-25 VITALS — BP 128/76 | HR 58 | Temp 97.4°F | Ht 62.0 in | Wt 235.2 lb

## 2016-05-25 DIAGNOSIS — J984 Other disorders of lung: Secondary | ICD-10-CM

## 2016-05-25 DIAGNOSIS — R5381 Other malaise: Secondary | ICD-10-CM

## 2016-05-25 DIAGNOSIS — R0609 Other forms of dyspnea: Secondary | ICD-10-CM

## 2016-05-25 DIAGNOSIS — E668 Other obesity: Secondary | ICD-10-CM

## 2016-05-25 DIAGNOSIS — E669 Obesity, unspecified: Secondary | ICD-10-CM

## 2016-05-25 MED ORDER — FLUTICASONE-SALMETEROL 100-50 MCG/DOSE IN AEPB: 1.0000 | INHALATION_SPRAY | Freq: Two times a day (BID) | RESPIRATORY_TRACT | 0 refills | Status: DC

## 2016-05-25 NOTE — Patient Instructions (Signed)
Today we updated your med list in our EPIC system...    Continue your current medications the same...  We gave you some ADVAIR100 samples- use this one inhalation twice daily...    Your breathing will improve further as you lose the weight...  Contact your PCP regarding your back pain and for help w/ weight reduction...  DrKelly's office should be helping w/ your BiPAP machine & mask interface...  Let's plan a follow up visit in 52mo sooner if needed for problems..Marland KitchenMarland Kitchen

## 2016-05-25 NOTE — Progress Notes (Signed)
Subjective:     Patient ID: Stacey Summers, female   DOB: 02/23/1946, 70 y.o.   MRN: [id number]  HPI      Her PCP is Dr. Morrie Sheldon in [place], [place]...   ~  August 04, 2015:  Initial pulmonary consult w/ SN>        47 y/o WF, referred by DrCroitoru for a pulmonary ebvaluation due to dyspnea;  She is a difficult historian-  c/o SOB/ dyspnea w/ min activity and ADLs x yrs she says (eg- housework, "I do what I have to do", no change x yrs, "I push myself");  She also notes a mild cough- on & off, "I had polyps in my throat removed yrs ago", min sputum, no hemoptysis, ?chest discomfort- "I feel funny in my chest";  She went to [facility] in Nov w/ incr congestion after that due to the smoke she thinks;  She has not tried any OTC meds for her cough or SOB...       She is an ex-smoker having smoked for only 29yr betw 89& 70y/o all <1ppd and quit ~1970;  She notes that she was told by DrMoore that she had asthma in the past, and treated for occas bronchitic infections, placed on ADVAIR100-2spBid;  She denies hx pneumonia, Tb or known exposures;  She worked for [employer] & was at 6 diff plants but no raw cotton dust exposure and no known asbestos exposure;  She does note some choking episodes esp while eating, she's had a prev EGD 2/14 by DrPatterson- +esophagitis & stricture dilated, mild antritis, neg HPylori, rec to continue PPI & she is on Protonix40/d...       She has been evaluated for OSA by DrKelly> She c/o snoring, non-restorative sleep, difficulty falluing asleep & maintaining sleep; PSG 04/16/13 showed AHI=38.7/hr (all hypopneas) & 52/hr during REM, RDI= 45/hr & 57/hr during REM, there was O2 desat to 75%, no signif PLMS;  She subseq had a CPAP titration but I could not locate this report- [facility];  Download in [id number] 25/30 days, usually 6+hrs per day, good control w/ AHI<1... There are no recent downloads in Epic, she indicates that she stopped using  the bipap on her own over the last yr, DrC has encouraged her to use it nightly...       She is followed for CARDS by DrCroitoru (notes reviewed)> HBP, mult coronary risk factors, heart murmur, palpitations, +FamHx of sudden death, HL, Obesity, Fatigue, VI/ edema      She is followed by her PCP, DrMoore>  HBP, DM, HL, Obesity, edema, fatigue (she thinks it's fibromyalgia), back pain, venous insuffic w/ edema EXAM shows Afeb, VSS , O2sat=98% on RA; she is 230#, 5'2"Tall, BMI=42; HEENT- neg, mallampati2;  Chest- clear w/o w/r/r;  Heart- RR gr1-2/6 SEM w/o r/g;  Abd- obese soft neg;  Ext- VI, tr edema w/o c/c; Neuro- intact...   CXR 08/04/15 showed norm heart size, clear lungs, NAD...  Spirometry 08/04/15 showed FVC=1.87 (69%), FEV1=1.51 (72%), %1sec=81%, mid-flows wnl at 88% predicted... C/w mild-mod restriction...  Ambulatory oximetry> O2sat=98% on RA at rest w/ pulse=76/min; she walked 3laps in office today w/ lowest O2sat=96% w/ pulse=92/min... IMP/PLAN>>  Stacey Summers's dyspnea appears to be multifactorial w/ components from pulm restriction due to obesity, deconditioning, OSA (not using CPAP regularly), cardiac factors;  Her cough is likely related to her reflux/ GERD/ choking episodes & she needs a vigorous antireflux regimen, plus  her PPI, and trial of Tramadol to suppress the cough (may need to STOP her ACE rx later);  Overall improvement in her dyspnea is going to require her to get on a diet, increase her exercise program, and get her weight down but she does not appear very motivated;  She also needs to get back on her BiPAP for her OSA- I suggested to her to f/u w/ Quentin Ore who did her initial sleep eval... ROV in 2-211moto check her progress.  ~  November 18, 2015:  3-435moOV & pulmonary follow up w/ SN>      Stacey Summers that her symptoms are about the same over the last few months;  We felt that her DYSPNEA was multifactorial (as above) & we encouraged diet, exercise & wt reduction;  Wt is down 5# to  225# today & BMI=41- we reviewed diet recommendations;  She has not joined [employer] is not really exercising "I can't due to my fibromyalgia" and she notes that she no longer sings in church due to "I don't have the breath";  For her cough she has been using Tramadol50 prn & a more vigorous antireflux regimen- this seems to help;  She has OSA, not using her BiPAP, and she has a follow up appt w/ DrKelly soon... We reviewed the following medical problems during today's office visit >>     Dyspnea> multifactorial w/ components from pulm restriction due to obesity, deconditioning/sedentary, OSA w/ BiPAP noncompliance, cardiac factors;  Her pulm eval is benign other than some expected restriction from her obesity; ?if she's had any improvement on the Advair100?  In my opinion she will need to put in the hard work of DIET/ EXERCISE/ WT REDUCTION to see any improvement in her breathing...    Cough> this is most likely related to her GERD, reflux, choking episodes and she may need GI f/u; we reviewed antireflux regimen- Prilosec40 taken 3066mbefore the eve meal, NPO after dinner, elev HOB 6" blocks; continue Tramadol50 prn.    Hx OSA on BiPAP per DrKelly> she has not been using the BiPAP & it appears as though she hasn't had a sleep f/u appt in some time; suggest that she [facility] up [facility]...    Morbid Obesity> Wt is down 5# to 225# & BMI=41; she needs to put in the hard work w/ DIET/ EXERCISE/ WT REDUCTION as discussed...     Hx Esophagitis, GERD/reflux, stricture> she may need GI f/u but for now we reviewed Prilosec40 about 79m65mefore the eve meal, NPO after dinner, elev HOB 6" blocks...    Cardiac Hx> HBP, HxCP, HxPalpit, mult cardiac risk factors, +FamHx sudden cardiac death- followed by DrCroitoru...    Medical Hx> DM, Obesity, GERD, Divertics/ polyps, Back pain, Leg pain, Fatigue, Venous Insuffic/ edema- followed by Dr. Don Morrie Sheldonal... EXAM shows Afeb, VSS ,  O2sat=96% on RA; she is 225#, 5'2"Tall, BMI=41; HEENT- neg, mallampati2;  Chest- clear w/o w/r/r;  Heart- RR gr1-2/6 SEM w/o r/g;  Abd- obese soft neg;  Ext- VI, tr edema w/o c/c; Neuro- intact...   LABS 09/2015 in epic>  Chems- ok x BS=126, A1c=6.5;   IMP/PLAN>>  DonnThressa+- stable but nothing really has changed;  We reviewed the recommendation for diet, exercise, wt reduction;  Rec to use IS device for "lung exerciser";  She will f/u w/ DrKelly for her sleep apnea...   ~  May 25, 2016:  11mo 53mow/  SN>  Pt cancelled 2 follow up appts in Aug & Sept-- returns today w/ CC back pain & she tells me that they want her to do shots or surg;  Followed for dyspnea (multifactorial w/ component from pulm restriction due to obesity, deconditioning, sedentary lifestyle, etc), cough (likely related to reflux/ GERD), and OSA on BiPAP per [facility];  She is taking Advair100-Bid + Prilosce40 and antireflux regimen;  DrKelly handles her BiPAP supplies and follow up of this problem...     She states that her breathing is OK/ same/ no change;  She is unable to exercise due to her back issues;  ADLs are ok & she walks to mailbox;  She tells me tat she is too scared to proceed w/ shots or surg for her back;  She notes mild cough, min sput, no hemoptysis, DOE w/ min activ and denies CP/ palpit/ edema...     She saw CARDS-DrCroitoru on 01/20/16>  HBP, asymptomatic PVCs, edema, HL, OSA w/ morbid obesity;  They discussed compliance w/ meds and BiPAP, no other changes needed...     She is not using her BiPAP regularly & she need sleep f/u w/ DrKelly to discuss her issues... EXAM shows Afeb, VSS , O2sat=97% on RA; she is up 10# to 235#, 5'2"Tall, BMI=42+; HEENT- neg, mallampati2;  Chest- clear w/o w/r/r;  Heart- RR gr1-2/6 SEM w/o r/g;  Abd- obese soft neg;  Ext- VI, tr edema w/o c/c; Neuro- intact...  IMP/PLAN>>  Stacey Summers is stable but has not made strides to improve her situation;  She needs an effective DIET/ EXERCISE program/ WEIGHT  REDUCTION & we reviewed strategies;  Continue Advair100Bid, get weight down, etc;  Getting back pain taken care of is a big part in getting back to exercise/ losing wt/ & improving her condition...    Past Medical History  Diagnosis Date  . Asthma >> on ADVAIR100-2spBid per DrMoore     bronchitis  . Allergy     seasonal  . Arthritis   . GERD (gastroesophageal reflux disease) >> on Prilosec40   . Hyperlipidemia >> on Lipitor40   . Hypertension >> on Amlod5, Lotensin40, Lasix40am&20pm, Zaroxyln2.5 2d/wk   . Tubular adenoma of colon 05/09/12    "innumerable number" of flat polyps  . DJD (degenerative joint disease) >> on Neurontin300Bid & Flexeril5Tid prn   . Anxiety >> on Xanax0.25 prn   . Cataract   . History of abnormal mammogram     Past Surgical History:  Procedure Laterality Date  . CARPAL TUNNEL RELEASE Left 10/20/14   [facility]  . Dawn  . CHOLECYSTECTOMY  1990  . LARYNX SURGERY     POLYP REMOVAL  . POLYPECTOMY     vocal cord  . TONSILLECTOMY  1965  . TUMOR REMOVAL     benign fatty tumors on abdomen    Outpatient Encounter Prescriptions as of 05/25/2016  Medication Sig  . ALPRAZolam (XANAX) 0.25 MG tablet Take 1 tablet (0.25 mg total) by mouth 2 (two) times daily as needed.  Marland Kitchen amLODipine (NORVASC) 5 MG tablet Take 1 tablet (5 mg total) by mouth daily.  Marland Kitchen aspirin 81 MG tablet Take 81 mg by mouth as needed for pain.  Marland Kitchen atorvastatin (LIPITOR) 40 MG tablet TAKE ONE TABLET BY MOUTH ONCE DAILY  . benazepril (LOTENSIN) 40 MG tablet Take 1 tablet (40 mg total) by mouth daily.  . Blood Glucose Monitoring Suppl (LIBERTY BLOOD GLUCOSE MONITOR) DEVI 1 each by Does not apply route daily.  Test 1x per day and prn-dx250.02  . Calcium-Vitamin D-Vitamin K (CALCIUM SOFT CHEWS) 500-100-40 MG-UNT-MCG CHEW Chew 1 each by mouth daily.  . cholecalciferol (VITAMIN D) 1000 UNITS tablet Take 1,000 Units by mouth daily.   . cyclobenzaprine (FLEXERIL) 5 MG tablet TAKE 1 BY MOUTH  3 TIMES DAILY AS NEEDED FOR MUSCLE SPASMS  --Ronnald Collum NP  . Fluticasone-Salmeterol (ADVAIR) 100-50 MCG/DOSE AEPB Inhale 1 puff into the lungs 2 (two) times daily.  . furosemide (LASIX) 40 MG tablet TAKE 1 BY MOUTH EVERY MORNING AND 1/2 BY MOUTH AT 1 IN THE AFTERNOON  . gabapentin (NEURONTIN) 300 MG capsule TAKE ONE CAPSULE BY MOUTH TWICE DAILY  . glucose blood test strip Test 1x per day and prn- dx 250.02  . metolazone (ZAROXOLYN) 2.5 MG tablet 1 po 2x a week as needed  . NON FORMULARY Take 2 capsules by mouth 2 (two) times daily. OMEGA XL Proprietary Blend 325m  . omeprazole (PRILOSEC) 40 MG capsule TAKE 1 BY MOUTH DAILY --[name]  . ONETOUCH DELICA LANCETS 390WMISC 1 each by Does not apply route daily. Test 1X per day and prn-- dx 250.02  . traMADol (ULTRAM) 50 MG tablet TAKE ONE TABLET BY MOUTH ONCE DAILY AS NEEDED  . Fluticasone-Salmeterol (ADVAIR DISKUS) 100-50 MCG/DOSE AEPB Inhale 1 puff into the lungs 2 (two) times daily.   No facility-administered encounter medications on file as of 05/25/2016.     No Known Allergies   Immunization History  Administered Date(s) Administered  . Influenza,inj,Quad PF,36+ Mos 05/28/2014, 06/16/2015  . Pneumococcal Conjugate-13 07/21/2014  . Pneumococcal Polysaccharide-23 09/21/2015    Current Medications, Allergies, Past Medical History, Past Surgical History, Family History, and Social History were reviewed in [employer].   Review of Systems             All symptoms NEG except where BOLDED >>  Constitutional:  F/C/S, fatigue, anorexia, unexpected weight change. HEENT:  HA, visual changes, hearing loss, earache, nasal symptoms, sore throat, mouth sores, hoarseness. Resp:  cough, sputum, hemoptysis; SOB, tightness, wheezing. Cardio:  CP, palpit, DOE, orthopnea, edema. GI:  N/V/D/C, blood in stool; reflux, abd pain, distention, gas. GU:  dysuria, freq, urgency, hematuria, flank pain, voiding  difficulty. MS:  joint pain, swelling, tenderness, decr ROM; neck pain, back pain, etc. Neuro:  HA, tremors, seizures, dizziness, syncope, weakness, numbness, gait abn. Skin:  suspicious lesions or skin rash. Heme:  adenopathy, bruising, bleeding. Psyche:  confusion, agitation, sleep disturbance, hallucinations, anxiety, depression suicidal.   Objective:   Physical Exam       Vital Signs:  Reviewed...   General:  WD, Obese, 70y/o WM in NAD; alert & oriented; pleasant & cooperative... HEENT:  [place]/AT; Conjunctiva- pink, Sclera- nonicteric, EOM-wnl, PERRLA, EACs-clear, TMs-wnl; NOSE-clear; THROAT-clear & wnl.  Neck:  Supple w/ fair ROM; no JVD; normal carotid impulses w/o bruits; no thyromegaly or nodules palpated; no lymphadenopathy.  Chest:  Clear to P & A; without wheezes, rales, or rhonchi heard. Heart:  Regular Rhythm; norm S1 & S2 Gr1-2/6 SEM w/o rubs or gallops detected. Abdomen:  Obese, soft & nontender- no guarding or rebound; normal bowel sounds; no organomegaly or masses palpated. Ext:  decr ROM; without deformities +arthritic changes; no varicose veins, +venous insuffic, tr edema;  Pulses intact w/o bruits. Neuro:  CNs II-XII intact; motor testing normal; no focal neuro deficit Derm:  No lesions noted; no rash etc. Lymph:  No cervical, supraclavicular, axillary, or inguinal adenopathy palpated.  Assessment:      IMP>>      Dyspnea> multifactorial w/ components from pulm restriction due to obesity, deconditioning/sedentary, OSA w/ BiPAP noncompliance, cardiac factors;  Her pulm eval is benign other than some expected restriction from her obesity; ?if she's had any improvement on the Advair100?  In my opinion she will need to put in the hard work of DIET/ EXERCISE/ WT REDUCTION to see any improvement in her breathing...    Cough> this is most likely related to her GERD, reflux, choking episodes and she may need GI f/u soon; for now we reviewed antireflux regimen- Prilosec40 taken  63mn before the eve meal, NPO after dinner, elev HOB 6" blocks;  We will try TRAMADOL534mTid prn for the cough...    Hx OSA on BiPAP per DrKelly> she has not been using the BiPAP & it appears as though she hasn't had a sleep f/u appt in some time; suggest that she set up [facility]/ [facility]...    Morbid Obesity> she needs to put in the hard work w/ DIET/ EXERCISE/ WT REDUCTION as discussed...     Hx Esophagitis, GERD/reflux, stricture> she may need GI f/u soon but for now we reviewed Prilosec40 about 3029mbefore the eve meal, NPO after dinner, elev HOB 6" blocks...    Cardiac Hx> HBP, HxCP, HxPalpit, mult cardiac risk factors, +FamHx sudden cardiac death- followed by DrCroitoru...    Medical Hx> DM, Obesity, GERD, Divertics/ polyps, Back pain, Leg pain, Fatigue, Venous Insuffic/ edema- followed by Dr. DonMorrie Sheldon al...  PLAN>>   08/04/15>   Stacey Summers's dyspnea appears to be multifactorial w/ components from pulm restriction due to obesity, deconditioning, OSA (not using CPAP regularly), cardiac factors;  Her cough is likely related to her reflux/ GERD/ choking episodes & she needs a vigorous antireflux regimen, plus her PPI, and trial of Tramadol to suppress the cough (may need to STOP her ACE rx later);  Overall improvement in her dyspnea is going to require her to get on a diet, increase her exercise program, and get her weight down but she does not appear very motivated;  She also needs to get back on her BiPAP for her OSA- I suggested to her to f/u w/ DrKQuentin Oreo did her initial sleep eval...  11/18/15>   Stacey Summers +- stable but nothing really has changed;  We reviewed the recommendation for diet, exercise, wt reduction;  Rec to use IS device for "lung exerciser";  She will f/u w/ DrKelly for her sleep apnea...   1-/11/17>   Stacey Summers stable but has not made strides to improve her situation;  She needs an effective DIET/ EXERCISE program/ WEIGHT REDUCTION & we reviewed strategies;   Continue Advair100Bid, get weight down, etc;  Getting back pain taken care of is a big part in getting back to exercise/ losing wt/ & improving her condition.     Plan:     Patient's Medications  New Prescriptions   FLUTICASONE-SALMETEROL (ADVAIR DISKUS) 100-50 MCG/DOSE AEPB    Inhale 1 puff into the lungs 2 (two) times daily.  Previous Medications   ALPRAZOLAM (XANAX) 0.25 MG TABLET    Take 1 tablet (0.25 mg total) by mouth 2 (two) times daily as needed.   AMLODIPINE (NORVASC) 5 MG TABLET    Take 1 tablet (5 mg total) by mouth daily.   ASPIRIN 81 MG TABLET    Take 81 mg by mouth as needed for pain.   ATORVASTATIN (  LIPITOR) 40 MG TABLET    TAKE ONE TABLET BY MOUTH ONCE DAILY   BENAZEPRIL (LOTENSIN) 40 MG TABLET    Take 1 tablet (40 mg total) by mouth daily.   BLOOD GLUCOSE MONITORING SUPPL (LIBERTY BLOOD GLUCOSE MONITOR) DEVI    1 each by Does not apply route daily. Test 1x per day and prn-dx250.02   CALCIUM-VITAMIN D-VITAMIN K (CALCIUM SOFT CHEWS) 500-100-40 MG-UNT-MCG CHEW    Chew 1 each by mouth daily.   CHOLECALCIFEROL (VITAMIN D) 1000 UNITS TABLET    Take 1,000 Units by mouth daily.    CYCLOBENZAPRINE (FLEXERIL) 5 MG TABLET    TAKE 1 BY MOUTH 3 TIMES DAILY AS NEEDED FOR MUSCLE SPASMS  --MARY MARTIN NP   FLUTICASONE-SALMETEROL (ADVAIR) 100-50 MCG/DOSE AEPB    Inhale 1 puff into the lungs 2 (two) times daily.   FUROSEMIDE (LASIX) 40 MG TABLET    TAKE 1 BY MOUTH EVERY MORNING AND 1/2 BY MOUTH AT 1 IN THE AFTERNOON   GABAPENTIN (NEURONTIN) 300 MG CAPSULE    TAKE ONE CAPSULE BY MOUTH TWICE DAILY   GLUCOSE BLOOD TEST STRIP    Test 1x per day and prn- dx 250.02   METOLAZONE (ZAROXOLYN) 2.5 MG TABLET    1 po 2x a week as needed   NON FORMULARY    Take 2 capsules by mouth 2 (two) times daily. OMEGA XL Proprietary Blend 310m   OMEPRAZOLE (PRILOSEC) 40 MG CAPSULE    TAKE 1 BY MOUTH DAILY --[name]   ONETOUCH DELICA LANCETS 332KMISC    1 each by Does not apply route daily. Test 1X per  day and prn-- dx 250.02   TRAMADOL (ULTRAM) 50 MG TABLET    TAKE ONE TABLET BY MOUTH ONCE DAILY AS NEEDED  Modified Medications   No medications on file  Discontinued Medications   No medications on file

## 2016-06-01 ENCOUNTER — Other Ambulatory Visit: Payer: Self-pay | Admitting: Nurse Practitioner

## 2016-06-01 DIAGNOSIS — M5440 Lumbago with sciatica, unspecified side: Secondary | ICD-10-CM

## 2016-06-02 NOTE — Telephone Encounter (Signed)
Refill called to [facility] & appt has already been made for next Friday

## 2016-06-02 NOTE — Telephone Encounter (Signed)
Last refill without being seen 

## 2016-06-06 ENCOUNTER — Ambulatory Visit: Payer: Medicare Other | Admitting: Pulmonary Disease

## 2016-06-08 ENCOUNTER — Ambulatory Visit: Payer: Medicare Other | Admitting: Pulmonary Disease

## 2016-06-10 ENCOUNTER — Encounter: Payer: Self-pay | Admitting: Nurse Practitioner

## 2016-06-10 ENCOUNTER — Ambulatory Visit (INDEPENDENT_AMBULATORY_CARE_PROVIDER_SITE_OTHER): Payer: PPO | Admitting: Nurse Practitioner

## 2016-06-10 VITALS — BP 113/64 | HR 53 | Temp 96.9°F | Ht 62.0 in | Wt 223.0 lb

## 2016-06-10 DIAGNOSIS — J410 Simple chronic bronchitis: Secondary | ICD-10-CM

## 2016-06-10 DIAGNOSIS — E785 Hyperlipidemia, unspecified: Secondary | ICD-10-CM | POA: Diagnosis not present

## 2016-06-10 DIAGNOSIS — E119 Type 2 diabetes mellitus without complications: Secondary | ICD-10-CM

## 2016-06-10 DIAGNOSIS — R5381 Other malaise: Secondary | ICD-10-CM

## 2016-06-10 DIAGNOSIS — E782 Mixed hyperlipidemia: Secondary | ICD-10-CM

## 2016-06-10 DIAGNOSIS — Z0000 Encounter for general adult medical examination without abnormal findings: Secondary | ICD-10-CM | POA: Diagnosis not present

## 2016-06-10 DIAGNOSIS — M797 Fibromyalgia: Secondary | ICD-10-CM

## 2016-06-10 DIAGNOSIS — Z23 Encounter for immunization: Secondary | ICD-10-CM

## 2016-06-10 DIAGNOSIS — I10 Essential (primary) hypertension: Secondary | ICD-10-CM

## 2016-06-10 DIAGNOSIS — M5441 Lumbago with sciatica, right side: Secondary | ICD-10-CM

## 2016-06-10 DIAGNOSIS — M5442 Lumbago with sciatica, left side: Secondary | ICD-10-CM

## 2016-06-10 DIAGNOSIS — Z1159 Encounter for screening for other viral diseases: Secondary | ICD-10-CM

## 2016-06-10 DIAGNOSIS — F411 Generalized anxiety disorder: Secondary | ICD-10-CM

## 2016-06-10 DIAGNOSIS — K219 Gastro-esophageal reflux disease without esophagitis: Secondary | ICD-10-CM

## 2016-06-10 DIAGNOSIS — R600 Localized edema: Secondary | ICD-10-CM

## 2016-06-10 DIAGNOSIS — Z01419 Encounter for gynecological examination (general) (routine) without abnormal findings: Secondary | ICD-10-CM

## 2016-06-10 DIAGNOSIS — Z1212 Encounter for screening for malignant neoplasm of rectum: Secondary | ICD-10-CM

## 2016-06-10 DIAGNOSIS — Z1211 Encounter for screening for malignant neoplasm of colon: Secondary | ICD-10-CM

## 2016-06-10 LAB — URINALYSIS, COMPLETE
Bilirubin, UA: NEGATIVE
GLUCOSE, UA: NEGATIVE
Ketones, UA: NEGATIVE
LEUKOCYTES UA: NEGATIVE
Nitrite, UA: NEGATIVE
PH UA: 5 (ref 5.0–7.5)
PROTEIN UA: NEGATIVE
RBC, UA: NEGATIVE
Specific Gravity, UA: 1.015 (ref 1.005–1.030)
Urobilinogen, Ur: 1 mg/dL (ref 0.2–1.0)

## 2016-06-10 LAB — MICROSCOPIC EXAMINATION: RBC, UA: NONE SEEN /hpf (ref 0–?)

## 2016-06-10 LAB — BAYER DCA HB A1C WAIVED: HB A1C: 6.8 % (ref <7.0)

## 2016-06-10 MED ORDER — ATORVASTATIN CALCIUM 40 MG PO TABS: 40.0000 mg | ORAL_TABLET | Freq: Every day | ORAL | 5 refills | Status: DC

## 2016-06-10 MED ORDER — ALPRAZOLAM 0.25 MG PO TABS: 0.2500 mg | ORAL_TABLET | Freq: Two times a day (BID) | ORAL | 2 refills | Status: DC | PRN

## 2016-06-10 MED ORDER — GABAPENTIN 300 MG PO CAPS: 300.0000 mg | ORAL_CAPSULE | Freq: Two times a day (BID) | ORAL | 5 refills | Status: DC

## 2016-06-10 MED ORDER — FUROSEMIDE 40 MG PO TABS: ORAL_TABLET | ORAL | 1 refills | Status: DC

## 2016-06-10 MED ORDER — OMEPRAZOLE 40 MG PO CPDR: DELAYED_RELEASE_CAPSULE | ORAL | 1 refills | Status: DC

## 2016-06-10 NOTE — Patient Instructions (Signed)
Diabetes and Foot Care Diabetes may cause you to have problems because of poor blood supply (circulation) to your feet and legs. This may cause the skin on your feet to become thinner, break easier, and heal more slowly. Your skin may become dry, and the skin may peel and crack. You may also have nerve damage in your legs and feet causing decreased feeling in them. You may not notice minor injuries to your feet that could lead to infections or more serious problems. Taking care of your feet is one of the most important things you can do for yourself.  HOME CARE INSTRUCTIONS  Wear shoes at all times, even in the house. Do not go barefoot. Bare feet are easily injured.  Check your feet daily for blisters, cuts, and redness. If you cannot see the bottom of your feet, use a mirror or ask someone for help.  Wash your feet with warm water (do not use hot water) and mild soap. Then pat your feet and the areas between your toes until they are completely dry. Do not soak your feet as this can dry your skin.  Apply a moisturizing lotion or petroleum jelly (that does not contain alcohol and is unscented) to the skin on your feet and to dry, brittle toenails. Do not apply lotion between your toes.  Trim your toenails straight across. Do not dig under them or around the cuticle. File the edges of your nails with an emery board or nail file.  Do not cut corns or calluses or try to remove them with medicine.  Wear clean socks or stockings every day. Make sure they are not too tight. Do not wear knee-high stockings since they may decrease blood flow to your legs.  Wear shoes that fit properly and have enough cushioning. To break in new shoes, wear them for just a few hours a day. This prevents you from injuring your feet. Always look in your shoes before you put them on to be sure there are no objects inside.  Do not cross your legs. This may decrease the blood flow to your feet.  If you find a minor scrape,  cut, or break in the skin on your feet, keep it and the skin around it clean and dry. These areas may be cleansed with mild soap and water. Do not cleanse the area with peroxide, alcohol, or iodine.  When you remove an adhesive bandage, be sure not to damage the skin around it.  If you have a wound, look at it several times a day to make sure it is healing.  Do not use heating pads or hot water bottles. They may burn your skin. If you have lost feeling in your feet or legs, you may not know it is happening until it is too late.  Make sure your health care provider performs a complete foot exam at least annually or more often if you have foot problems. Report any cuts, sores, or bruises to your health care provider immediately. SEEK MEDICAL CARE IF:   You have an injury that is not healing.  You have cuts or breaks in the skin.  You have an ingrown nail.  You notice redness on your legs or feet.  You feel burning or tingling in your legs or feet.  You have pain or cramps in your legs and feet.  Your legs or feet are numb.  Your feet always feel cold. SEEK IMMEDIATE MEDICAL CARE IF:   There is increasing redness,   swelling, or pain in or around a wound.  There is a red line that goes up your leg.  Pus is coming from a wound.  You develop a fever or as directed by your health care provider.  You notice a bad smell coming from an ulcer or wound.   This information is not intended to replace advice given to you by your health care provider. Make sure you discuss any questions you have with your health care provider.   Document Released: 07/29/2000 Document Revised: 04/03/2013 Document Reviewed: 01/08/2013 Elsevier Interactive Patient Education 2016 [employer] Inc.  

## 2016-06-10 NOTE — Progress Notes (Signed)
Subjective:    Patient ID: Stacey Summers, female    DOB: [birth year], 70 y.o.   MRN: [id number]  HPI  Patient here today for annual physical exam, PAP and follow up of chronic medical problems.  Outpatient Encounter Prescriptions as of 06/10/2016  Medication Sig  . ALPRAZolam (XANAX) 0.25 MG tablet Take 1 tablet (0.25 mg total) by mouth 2 (two) times daily as needed.  Marland Kitchen amLODipine (NORVASC) 5 MG tablet Take 1 tablet (5 mg total) by mouth daily.  Marland Kitchen aspirin 81 MG tablet Take 81 mg by mouth as needed for pain.  Marland Kitchen atorvastatin (LIPITOR) 40 MG tablet TAKE ONE TABLET BY MOUTH ONCE DAILY  . benazepril (LOTENSIN) 40 MG tablet Take 1 tablet (40 mg total) by mouth daily.  . Blood Glucose Monitoring Suppl (LIBERTY BLOOD GLUCOSE MONITOR) DEVI 1 each by Does not apply route daily. Test 1x per day and prn-dx250.02  . Calcium-Vitamin D-Vitamin K (CALCIUM SOFT CHEWS) 500-100-40 MG-UNT-MCG CHEW Chew 1 each by mouth daily.  . cholecalciferol (VITAMIN D) 1000 UNITS tablet Take 1,000 Units by mouth daily.   . cyclobenzaprine (FLEXERIL) 5 MG tablet TAKE 1 BY MOUTH 3 TIMES DAILY AS NEEDED FOR MUSCLE SPASMS  --Ronnald Collum NP  . Fluticasone-Salmeterol (ADVAIR DISKUS) 100-50 MCG/DOSE AEPB Inhale 1 puff into the lungs 2 (two) times daily.  . Fluticasone-Salmeterol (ADVAIR) 100-50 MCG/DOSE AEPB Inhale 1 puff into the lungs 2 (two) times daily.  . furosemide (LASIX) 40 MG tablet TAKE 1 BY MOUTH EVERY MORNING AND 1/2 BY MOUTH AT 1 IN THE AFTERNOON  . gabapentin (NEURONTIN) 300 MG capsule TAKE ONE CAPSULE BY MOUTH TWICE DAILY  . glucose blood test strip Test 1x per day and prn- dx 250.02  . metolazone (ZAROXOLYN) 2.5 MG tablet 1 po 2x a week as needed  . NON FORMULARY Take 2 capsules by mouth 2 (two) times daily. OMEGA XL Proprietary Blend 364m  . omeprazole (PRILOSEC) 40 MG capsule TAKE 1 BY MOUTH DAILY --MARY-MARGARET Reighlyn Elmes  . ONETOUCH DELICA LANCETS 337SMISC 1 each by Does not apply route daily. Test 1X per day  and prn-- dx 250.02  . traMADol (ULTRAM) 50 MG tablet TAKE ONE TABLET BY MOUTH ONCE DAILY AS NEEDED   No facility-administered encounter medications on file as of 06/10/2016.     Hypertension This is a chronic problem. The current episode started more than 1 year ago. The problem is unchanged. The problem is controlled. Pertinent negatives include no headaches, malaise/fatigue, palpitations or peripheral edema. Agents associated with hypertension include NSAIDs. Risk factors for coronary artery disease include dyslipidemia, obesity, post-menopausal state and sedentary lifestyle. Past treatments include diuretics, ACE inhibitors and beta blockers. The current treatment provides moderate improvement. Compliance problems include diet and exercise.   Hyperlipidemia This is a chronic problem. The current episode started more than 1 year ago. Recent lipid tests were reviewed and are variable. Exacerbating diseases include diabetes and obesity. She has no history of hypothyroidism. Factors aggravating her hyperlipidemia include fatty foods. Current antihyperlipidemic treatment includes statins. The current treatment provides moderate improvement of lipids. Compliance problems include adherence to diet and adherence to exercise.  Risk factors for coronary artery disease include diabetes mellitus, dyslipidemia, hypertension, obesity, post-menopausal and a sedentary lifestyle.  Diabetes She presents for her follow-up diabetic visit. She has type 2 diabetes mellitus. Her disease course has been stable. Pertinent negatives for hypoglycemia include no headaches. There are no diabetic associated symptoms. Pertinent negatives for diabetes include no polydipsia, no polyphagia,  no polyuria and no visual change. Symptoms are stable. Pertinent negatives for diabetic complications include no heart disease or nephropathy. Risk factors for coronary artery disease include diabetes mellitus, dyslipidemia, hypertension, obesity,  post-menopausal and sedentary lifestyle. Current diabetic treatment includes diet. She is compliant with treatment some of the time. Her weight is stable. She is following a high fat/cholesterol diet. When asked about meal planning, she reported none. She has not had a previous visit with a dietitian. She rarely participates in exercise. Her breakfast blood glucose is taken between 9-10 am. Her breakfast blood glucose range is generally 130-140 mg/dl. Her highest blood glucose is >200 mg/dl. Her overall blood glucose range is 140-180 mg/dl. An ACE inhibitor/angiotensin II receptor blocker is being taken. She does not see a podiatrist.Eye exam is current (August 2016).  Fibromyalgia Taking ultram and flexeril that makes pain tolerable- she has good days and bad days. States she does not take medication everyday if she does not need to.  GAD Xanax works well and only takes when she needs it. GERD Currently on omeprazole- keeps symptoms under control. Peripheral edema Reports she has seen a cardiologist, thinks it was January and is on same amount of lasix and edema has not been as bad. Put her on Amoldipine and took her off of metoprolol. COPD Currently on advair as needed - has not needed albuterol in awhile. States it is expensive and does not get it like she should. She just saw pulmonologist several weeks ago. Chronic back pain Has had for several years- they recommended steroid shots or surgery and patient refuses to have done. SHe takes an ultram occasionally- she was told that she could no longer call in for meds- would need to be seen to get refills.  * Weight down 12lbs.    Review of Systems  Constitutional: Negative.   Respiratory: Negative for cough, shortness of breath and wheezing.   Cardiovascular: Positive for palpitations (occasional) and leg swelling.  Genitourinary: Negative.   Musculoskeletal: Negative.   Neurological: Negative.   Hematological: Negative.     Psychiatric/Behavioral: Negative.   All other systems reviewed and are negative.      Objective:   Physical Exam  Constitutional: She is oriented to person, place, and time. She appears well-developed and well-nourished.  HENT:  Head: Normocephalic.  Right Ear: Hearing, tympanic membrane, external ear and ear canal normal.  Left Ear: Hearing, tympanic membrane, external ear and ear canal normal.  Nose: Nose normal.  Mouth/Throat: Uvula is midline and oropharynx is clear and moist.  Eyes: Conjunctivae and EOM are normal. Pupils are equal, round, and reactive to light.  Neck: Normal range of motion and full passive range of motion without pain. Neck supple. No JVD present. Carotid bruit is not present. No thyroid mass and no thyromegaly present.  Cardiovascular: Normal rate, normal heart sounds and intact distal pulses.   No murmur heard. Pulmonary/Chest: Effort normal and breath sounds normal. Right breast exhibits no inverted nipple, no mass, no nipple discharge, no skin change and no tenderness. Left breast exhibits no inverted nipple, no mass, no nipple discharge, no skin change and no tenderness.  Abdominal: Soft. Bowel sounds are normal. She exhibits no mass. There is no tenderness.  Genitourinary: Vagina normal and uterus normal. No breast swelling, tenderness, discharge or bleeding. No vaginal discharge found.  Genitourinary Comments: bimanual exam-No adnexal masses or tenderness. Cervix parous and pink  Musculoskeletal: Normal range of motion.  FROM of lumbar spine with pain on flexion and  extension (-) SLR bil Motor strength and sensation distally intact.  Lymphadenopathy:    She has no cervical adenopathy.  Neurological: She is alert and oriented to person, place, and time.  Skin: Skin is warm and dry.  Psychiatric: She has a normal mood and affect. Her behavior is normal. Judgment and thought content normal.   BP 113/64   Pulse (!) 53   Temp (!) 96.9 F (36.1 C) (Oral)    Ht 5' 2"  (1.575 m)   Wt 223 lb (101.2 kg)   BMI 40.79 kg/m   UA normal  HGBA1C 6.8%      Assessment & Plan:  1. Annual physical exam - Urinalysis, Complete  2. Essential hypertension Low sodium det- do not add salt to diet - CMP14+EGFR  3. Type 2 diabetes, diet controlled ([place]) Continue to watch carb in diet - Bayer DCA Hb A1c Waived  4. 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 Gastroesophageal reflux disease without esophagitis Avoid spicy foods Do not eat 2 hours prior to bedtime - omeprazole (PRILOSEC) 40 MG capsule; TAKE 1 BY MOUTH DAILY --MARY-MARGARET Pete Schnitzer  Dispense: 90 capsule; Refill: 1  7. Type 2 diabetes mellitus without complication, without long-term current use of insulin ([place])   8. Fibromyalgia muscle pain Exercise to keep muscles warm and will help with pain  9. GAD (generalized anxiety disorder) Stress management - ALPRAZolam (XANAX) 0.25 MG tablet; Take 1 tablet (0.25 mg total) by mouth 2 (two) times daily as needed.  Dispense: 30 tablet; Refill: 2  10. Physical deconditioning  11. Encounter for gynecological examination without abnormal finding - Pap IG (Image Guided)  12. Screening for colorectal cancer - Fecal occult blood, imunochemical; Future  13. Need for hepatitis C screening test - Hepatitis C antibody  14. Bilateral leg edema Elevate legs when sitting Encouraged to wear compressin hose - furosemide (LASIX) 40 MG tablet; TAKE 1 BY MOUTH EVERY MORNING AND 1/2 BY MOUTH AT 1 IN THE AFTERNOON  Dispense: 135 tablet; Refill: 1  15. Bilateral low back pain with bilateral sciatica, unspecified chronicity - gabapentin (NEURONTIN) 300 MG capsule; Take 1 capsule (300 mg total) by mouth 2 (two) times daily.  Dispense: 60 capsule; Refill: 5  16. Mixed hyperlipidemia - atorvastatin (LIPITOR) 40 MG tablet; Take 1 tablet (40 mg  total) by mouth daily.  Dispense: 30 tablet; Refill: 5    Labs pending Health maintenance reviewed Diet and exercise encouraged Continue all meds Follow up  In 3 months   [place], FNP

## 2016-06-11 LAB — CMP14+EGFR
ALK PHOS: 86 IU/L (ref 39–117)
ALT: 28 IU/L (ref 0–32)
AST: 44 IU/L — AB (ref 0–40)
Albumin/Globulin Ratio: 1.3 (ref 1.2–2.2)
Albumin: 3.8 g/dL (ref 3.6–4.8)
BILIRUBIN TOTAL: 2.9 mg/dL — AB (ref 0.0–1.2)
BUN/Creatinine Ratio: 25 (ref 12–28)
BUN: 24 mg/dL (ref 8–27)
CHLORIDE: 98 mmol/L (ref 96–106)
CO2: 30 mmol/L — AB (ref 18–29)
CREATININE: 0.95 mg/dL (ref 0.57–1.00)
Calcium: 9.5 mg/dL (ref 8.7–10.3)
GFR calc Af Amer: 71 mL/min/{1.73_m2} (ref >59)
GFR calc non Af Amer: 62 mL/min/{1.73_m2} (ref >59)
GLUCOSE: 134 mg/dL — AB (ref 65–99)
Globulin, Total: 2.9 g/dL (ref 1.5–4.5)
Potassium: 4.1 mmol/L (ref 3.5–5.2)
SODIUM: 144 mmol/L (ref 134–144)
Total Protein: 6.7 g/dL (ref 6.0–8.5)

## 2016-06-11 LAB — HEPATITIS C ANTIBODY: Hep C Virus Ab: <0.1 s/co ratio (ref 0.0–0.9)

## 2016-06-11 LAB — LIPID PANEL
CHOLESTEROL TOTAL: 112 mg/dL (ref 100–199)
Chol/HDL Ratio: 2.4 ratio units (ref 0.0–4.4)
HDL: 46 mg/dL (ref >39)
LDL CALC: 51 mg/dL (ref 0–99)
TRIGLYCERIDES: 73 mg/dL (ref 0–149)
VLDL CHOLESTEROL CAL: 15 mg/dL (ref 5–40)

## 2016-06-13 ENCOUNTER — Telehealth: Payer: Self-pay | Admitting: Cardiovascular Disease

## 2016-06-13 DIAGNOSIS — H40033 Anatomical narrow angle, bilateral: Secondary | ICD-10-CM | POA: Diagnosis not present

## 2016-06-13 DIAGNOSIS — E119 Type 2 diabetes mellitus without complications: Secondary | ICD-10-CM | POA: Diagnosis not present

## 2016-06-13 LAB — HM DIABETES EYE EXAM

## 2016-06-13 NOTE — Telephone Encounter (Signed)
Pt is wanting to speak with [facility] nurse regarding her sleep apnea machine

## 2016-06-16 LAB — PAP IG (IMAGE GUIDED): PAP SMEAR COMMENT: 0

## 2016-06-20 ENCOUNTER — Telehealth: Payer: Self-pay | Admitting: Nurse Practitioner

## 2016-06-20 NOTE — Telephone Encounter (Signed)
Pt notified of lab results Verbalizes understanding

## 2016-06-21 NOTE — Telephone Encounter (Signed)
Returned call. She needs settings changed to reduce airflow so that she can wear nasal mask.  She's having difficulty with getting information she needs. [employer] number for her DME supplier (doesn't know name but they have location in [facility]) - can we call her and give her contact info?  Do we have titration change orders to communicate to DME so they can adjust settings?

## 2016-06-21 NOTE — Telephone Encounter (Signed)
New Message  Pt voiced needing nurse to return her call ASAP.  Please f/u with pt

## 2016-06-23 ENCOUNTER — Other Ambulatory Visit: Payer: Self-pay | Admitting: *Deleted

## 2016-06-23 DIAGNOSIS — G4733 Obstructive sleep apnea (adult) (pediatric): Secondary | ICD-10-CM

## 2016-06-23 DIAGNOSIS — Z9989 Dependence on other enabling machines and devices: Principal | ICD-10-CM

## 2016-06-23 NOTE — Telephone Encounter (Signed)
Left message that order has been sent to [facility] to contact her to get a download on her CPAP machine. Once this has been done and Dr Claiborne Billings has received and reviewed the results we will contact her with recommendations of pressure change if needed.

## 2016-06-27 NOTE — Telephone Encounter (Signed)
ok 

## 2016-06-28 ENCOUNTER — Telehealth: Payer: Self-pay | Admitting: Cardiovascular Disease

## 2016-06-28 DIAGNOSIS — H02833 Dermatochalasis of right eye, unspecified eyelid: Secondary | ICD-10-CM | POA: Diagnosis not present

## 2016-06-28 DIAGNOSIS — H40003 Preglaucoma, unspecified, bilateral: Secondary | ICD-10-CM | POA: Diagnosis not present

## 2016-06-28 NOTE — Telephone Encounter (Signed)
New message   Pt verbalized that she is calling for rn and that she did not want to disclose any information

## 2016-06-30 ENCOUNTER — Other Ambulatory Visit: Payer: PPO

## 2016-06-30 DIAGNOSIS — Z1212 Encounter for screening for malignant neoplasm of rectum: Secondary | ICD-10-CM | POA: Diagnosis not present

## 2016-06-30 DIAGNOSIS — Z1211 Encounter for screening for malignant neoplasm of colon: Secondary | ICD-10-CM | POA: Diagnosis not present

## 2016-07-02 LAB — FECAL OCCULT BLOOD, IMMUNOCHEMICAL: Fecal Occult Bld: NEGATIVE

## 2016-07-06 ENCOUNTER — Telehealth: Payer: Self-pay | Admitting: Nurse Practitioner

## 2016-07-06 DIAGNOSIS — R609 Edema, unspecified: Secondary | ICD-10-CM

## 2016-07-06 MED ORDER — METOLAZONE 2.5 MG PO TABS: ORAL_TABLET | ORAL | 1 refills | Status: DC

## 2016-07-06 NOTE — Telephone Encounter (Signed)
rf sent

## 2016-07-19 DIAGNOSIS — G4733 Obstructive sleep apnea (adult) (pediatric): Secondary | ICD-10-CM | POA: Diagnosis not present

## 2016-07-20 ENCOUNTER — Other Ambulatory Visit: Payer: Self-pay | Admitting: Nurse Practitioner

## 2016-07-20 ENCOUNTER — Telehealth: Payer: Self-pay | Admitting: Cardiovascular Disease

## 2016-07-20 NOTE — Telephone Encounter (Signed)
To me it makes most sense to remain on the metoprolol if she is doing well on it. Please refill the Rx and do not start the amlodipine.Stacey Summers

## 2016-07-20 NOTE — Telephone Encounter (Signed)
New Message  Pt c/o medication issue:  1. Name of Medication: Metolazone....... amlodipine  2. How are you currently taking this medication (dosage and times per day)? 2.67m....Marland KitchenMarland KitchenMarland Kitchen558m 3. Are you having a reaction (difficulty breathing--STAT)? no  4. What is your medication issue? Per pt would like to speak with RN to get further instructions on taking both medication. Please call back to discuss   .

## 2016-07-20 NOTE — Telephone Encounter (Signed)
Spoke to patient. She clarifies that she does not have questions about metolazone, had question regarding metoprolol. States she is still taking this, and not taking amlodipine. It's not clear why she's taking the metoprolol still, but looks like this switch was supposed to have been made several months ago. She states she had a lot of this medication in supply at home and had been taking it. She also has a prescription for amlodipine but her med list does not have metoprolol on it.  She thinks she was instructed at some point to make changes due to metoprolol being of concern for possible fatigue. She's not having these symptoms now.  She is going to start amlodipine in 3 days unless o/w instructed - has 3 days of metoprolol left. Patient aware I am really not sure how to advise - may be fine to just go ahead and make change and monitor for symptoms, but if she's doing well on metoprolol perhaps she should just stay on it.  This may be best addressed in a medication management setting. Will route to provider for any input.

## 2016-07-20 NOTE — Telephone Encounter (Signed)
Left detailed msg (ok per DPR) w instructions to call.

## 2016-07-20 NOTE — Telephone Encounter (Signed)
TC back to pt in looking through notes, noted that Cardiologist on 07/16/15 visit had stopped metoprolol & started amlodopine. Instructed pt to contact their office to verify and refill the correct medication that she should be taking.

## 2016-07-20 NOTE — Telephone Encounter (Signed)
Pt has been getting refills from mail order pharmacy

## 2016-07-20 NOTE — Telephone Encounter (Signed)
Metoprolol is not on her list of meds- who rx

## 2016-07-22 NOTE — Telephone Encounter (Signed)
Left msg to call.

## 2016-08-10 ENCOUNTER — Other Ambulatory Visit: Payer: Self-pay | Admitting: Nurse Practitioner

## 2016-08-23 ENCOUNTER — Telehealth: Payer: Self-pay | Admitting: *Deleted

## 2016-08-23 NOTE — Telephone Encounter (Signed)
Returned  CPAP supply order dated 07/19/16 to [facility].

## 2016-08-26 ENCOUNTER — Encounter: Payer: Self-pay | Admitting: Gastroenterology

## 2016-09-13 ENCOUNTER — Ambulatory Visit: Payer: PPO | Admitting: Nurse Practitioner

## 2016-09-14 ENCOUNTER — Encounter: Payer: Self-pay | Admitting: Nurse Practitioner

## 2016-09-15 ENCOUNTER — Telehealth: Payer: Self-pay | Admitting: *Deleted

## 2016-09-15 NOTE — Telephone Encounter (Signed)
PAP headgear, CPAP/BIPAP tubing, filter disp uw CPAP device,water chamber for humder CPAP and full face mask used with CPAP orders faxed

## 2016-09-30 ENCOUNTER — Telehealth: Payer: Self-pay | Admitting: Pulmonary Disease

## 2016-09-30 DIAGNOSIS — J410 Simple chronic bronchitis: Secondary | ICD-10-CM

## 2016-09-30 MED ORDER — FLUTICASONE-SALMETEROL 100-50 MCG/DOSE IN AEPB: 1.0000 | INHALATION_SPRAY | Freq: Two times a day (BID) | RESPIRATORY_TRACT | 0 refills | Status: DC

## 2016-09-30 NOTE — Telephone Encounter (Signed)
Patient is waiting in lobby to pick up samples.Stacey Summers

## 2016-09-30 NOTE — Telephone Encounter (Signed)
Pt. Came in today needing sample of her Advair Diskus 100. Samples were given to pt. She had no further questions nothing further is needed.

## 2016-10-05 ENCOUNTER — Encounter: Payer: Self-pay | Admitting: Gastroenterology

## 2016-10-12 ENCOUNTER — Encounter: Payer: Self-pay | Admitting: Nurse Practitioner

## 2016-10-12 ENCOUNTER — Ambulatory Visit (INDEPENDENT_AMBULATORY_CARE_PROVIDER_SITE_OTHER): Payer: PPO | Admitting: Nurse Practitioner

## 2016-10-12 VITALS — BP 114/63 | HR 74 | Temp 96.8°F | Ht 62.0 in | Wt 226.0 lb

## 2016-10-12 DIAGNOSIS — J410 Simple chronic bronchitis: Secondary | ICD-10-CM | POA: Diagnosis not present

## 2016-10-12 DIAGNOSIS — M5442 Lumbago with sciatica, left side: Secondary | ICD-10-CM

## 2016-10-12 DIAGNOSIS — E119 Type 2 diabetes mellitus without complications: Secondary | ICD-10-CM

## 2016-10-12 DIAGNOSIS — E782 Mixed hyperlipidemia: Secondary | ICD-10-CM

## 2016-10-12 DIAGNOSIS — F411 Generalized anxiety disorder: Secondary | ICD-10-CM

## 2016-10-12 DIAGNOSIS — E785 Hyperlipidemia, unspecified: Secondary | ICD-10-CM | POA: Diagnosis not present

## 2016-10-12 DIAGNOSIS — R600 Localized edema: Secondary | ICD-10-CM | POA: Diagnosis not present

## 2016-10-12 DIAGNOSIS — M797 Fibromyalgia: Secondary | ICD-10-CM | POA: Diagnosis not present

## 2016-10-12 DIAGNOSIS — R5383 Other fatigue: Secondary | ICD-10-CM | POA: Diagnosis not present

## 2016-10-12 DIAGNOSIS — K219 Gastro-esophageal reflux disease without esophagitis: Secondary | ICD-10-CM

## 2016-10-12 DIAGNOSIS — I10 Essential (primary) hypertension: Secondary | ICD-10-CM | POA: Diagnosis not present

## 2016-10-12 DIAGNOSIS — M5441 Lumbago with sciatica, right side: Secondary | ICD-10-CM

## 2016-10-12 LAB — BAYER DCA HB A1C WAIVED: HB A1C (BAYER DCA - WAIVED): 7.3 % — ABNORMAL HIGH (ref <7.0)

## 2016-10-12 MED ORDER — FUROSEMIDE 40 MG PO TABS: ORAL_TABLET | ORAL | 1 refills | Status: DC

## 2016-10-12 MED ORDER — TRAMADOL HCL 50 MG PO TABS: 50.0000 mg | ORAL_TABLET | Freq: Every day | ORAL | 0 refills | Status: DC | PRN

## 2016-10-12 MED ORDER — AMLODIPINE BESYLATE 5 MG PO TABS: 5.0000 mg | ORAL_TABLET | Freq: Every day | ORAL | 1 refills | Status: DC

## 2016-10-12 MED ORDER — OMEPRAZOLE 40 MG PO CPDR: DELAYED_RELEASE_CAPSULE | ORAL | 1 refills | Status: DC

## 2016-10-12 MED ORDER — METFORMIN HCL 500 MG PO TABS: 500.0000 mg | ORAL_TABLET | Freq: Two times a day (BID) | ORAL | 5 refills | Status: DC

## 2016-10-12 MED ORDER — BENAZEPRIL HCL 40 MG PO TABS: 40.0000 mg | ORAL_TABLET | Freq: Every day | ORAL | 1 refills | Status: DC

## 2016-10-12 MED ORDER — ATORVASTATIN CALCIUM 40 MG PO TABS: 40.0000 mg | ORAL_TABLET | Freq: Every day | ORAL | 5 refills | Status: DC

## 2016-10-12 NOTE — Addendum Note (Signed)
Addended by: Chevis Pretty on: 10/12/2016 04:49 PM   Modules accepted: Orders

## 2016-10-12 NOTE — Patient Instructions (Signed)
Carbohydrate Counting for Diabetes Mellitus, Adult Carbohydrate counting is a method for keeping track of how many carbohydrates you eat. Eating carbohydrates naturally increases the amount of sugar (glucose) in the blood. Counting how many carbohydrates you eat helps keep your blood glucose within normal limits, which helps you manage your diabetes (diabetes mellitus). It is important to know how many carbohydrates you can safely have in each meal. This is different for every person. A diet and nutrition specialist (registered dietitian) can help you make a meal plan and calculate how many carbohydrates you should have at each meal and snack. Carbohydrates are found in the following foods:  Grains, such as breads and cereals.  Dried beans and soy products.  Starchy vegetables, such as potatoes, peas, and corn.  Fruit and fruit juices.  Milk and yogurt.  Sweets and snack foods, such as cake, cookies, candy, chips, and soft drinks. How do I count carbohydrates? There are two ways to count carbohydrates in food. You can use either of the methods or a combination of both. Reading "Nutrition Facts" on packaged food  The "Nutrition Facts" list is included on the labels of almost all packaged foods and beverages in the [place] It includes:  The serving size.  Information about nutrients in each serving, including the grams (g) of carbohydrate per serving. To use the "Nutrition Facts":  Decide how many servings you will have.  Multiply the number of servings by the number of carbohydrates per serving.  The resulting number is the total amount of carbohydrates that you will be having. Learning standard serving sizes of other foods  When you eat foods containing carbohydrates that are not packaged or do not include "Nutrition Facts" on the label, you need to measure the servings in order to count the amount of carbohydrates:  Measure the foods that you will eat with a food scale or measuring  cup, if needed.  Decide how many standard-size servings you will eat.  Multiply the number of servings by 15. Most carbohydrate-rich foods have about 15 g of carbohydrates per serving.  For example, if you eat 8 oz (170 g) of strawberries, you will have eaten 2 servings and 30 g of carbohydrates (2 servings x 15 g = 30 g).  For foods that have more than one food mixed, such as soups and casseroles, you must count the carbohydrates in each food that is included. The following list contains standard serving sizes of common carbohydrate-rich foods. Each of these servings has about 15 g of carbohydrates:   hamburger bun or  English muffin.   oz (15 mL) syrup.   oz (14 g) jelly.  1 slice of bread.  1 six-inch tortilla.  3 oz (85 g) cooked rice or pasta.  4 oz (113 g) cooked dried beans.  4 oz (113 g) starchy vegetable, such as peas, corn, or potatoes.  4 oz (113 g) hot cereal.  4 oz (113 g) mashed potatoes or  of a large baked potato.  4 oz (113 g) canned or frozen fruit.  4 oz (120 mL) fruit juice.  4-6 crackers.  6 chicken nuggets.  6 oz (170 g) unsweetened dry cereal.  6 oz (170 g) plain fat-free yogurt or yogurt sweetened with artificial sweeteners.  8 oz (240 mL) milk.  8 oz (170 g) fresh fruit or one small piece of fruit.  24 oz (680 g) popped popcorn. Example of carbohydrate counting Sample meal  3 oz (85 g) chicken breast.  6 oz (  170 g) brown rice.  4 oz (113 g) corn.  8 oz (240 mL) milk.  8 oz (170 g) strawberries with sugar-free whipped topping. Carbohydrate calculation 1. Identify the foods that contain carbohydrates:  Rice.  Corn.  Milk.  Strawberries. 2. Calculate how many servings you have of each food:  2 servings rice.  1 serving corn.  1 serving milk.  1 serving strawberries. 3. Multiply each number of servings by 15 g:  2 servings rice x 15 g = 30 g.  1 serving corn x 15 g = 15 g.  1 serving milk x 15 g = 15  g.  1 serving strawberries x 15 g = 15 g. 4. Add together all of the amounts to find the total grams of carbohydrates eaten:  30 g + 15 g + 15 g + 15 g = 75 g of carbohydrates total. This information is not intended to replace advice given to you by your health care provider. Make sure you discuss any questions you have with your health care provider. Document Released: 08/01/2005 Document Revised: 02/19/2016 Document Reviewed: 01/13/2016 Elsevier Interactive Patient Education  2017 [employer]  

## 2016-10-12 NOTE — Progress Notes (Signed)
Subjective:    Patient ID: Stacey Summers, female    DOB: 1946-06-18, 71 y.o.   MRN: [id number]  Patient here today for follow up of chronic medical problems. No complaints today.  Outpatient Encounter Prescriptions as of 10/12/2016  Medication Sig  . ALPRAZolam (XANAX) 0.25 MG tablet Take 1 tablet (0.25 mg total) by mouth 2 (two) times daily as needed.  Marland Kitchen amLODipine (NORVASC) 5 MG tablet Take 1 tablet (5 mg total) by mouth daily.  Marland Kitchen aspirin 81 MG tablet Take 81 mg by mouth as needed for pain.  Marland Kitchen atorvastatin (LIPITOR) 40 MG tablet Take 1 tablet (40 mg total) by mouth daily.  . benazepril (LOTENSIN) 40 MG tablet Take 1 tablet (40 mg total) by mouth daily.  . Blood Glucose Monitoring Suppl (LIBERTY BLOOD GLUCOSE MONITOR) DEVI 1 each by Does not apply route daily. Test 1x per day and prn-dx250.02  . Calcium-Vitamin D-Vitamin K (CALCIUM SOFT CHEWS) 500-100-40 MG-UNT-MCG CHEW Chew 1 each by mouth daily.  . cholecalciferol (VITAMIN D) 1000 UNITS tablet Take 1,000 Units by mouth daily.   . cyclobenzaprine (FLEXERIL) 5 MG tablet TAKE 1 BY MOUTH 3 TIMES DAILY AS NEEDED FOR MUSCLE SPASMS  --Ronnald Collum NP  . Fluticasone-Salmeterol (ADVAIR) 100-50 MCG/DOSE AEPB Inhale 1 puff into the lungs 2 (two) times daily.  . furosemide (LASIX) 40 MG tablet TAKE 1 BY MOUTH EVERY MORNING AND 1/2 BY MOUTH AT 1 IN THE AFTERNOON  . gabapentin (NEURONTIN) 300 MG capsule Take 1 capsule (300 mg total) by mouth 2 (two) times daily.  Marland Kitchen glucose blood test strip Test 1x per day and prn- dx 250.02  . metolazone (ZAROXOLYN) 2.5 MG tablet 1 po 2x a week as needed  . NON FORMULARY Take 2 capsules by mouth 2 (two) times daily. OMEGA XL Proprietary Blend 356m  . omeprazole (PRILOSEC) 40 MG capsule TAKE 1 BY MOUTH DAILY --MARY-MARGARET Saiquan Hands  . ONE TOUCH ULTRA TEST test strip USE ONE STRIP TO CHECK GLUCOSE ONCE DAILY  . ONETOUCH DELICA LANCETS 316BMISC 1 each by Does not apply route daily. Test 1X per day and prn-- dx 250.02    . traMADol (ULTRAM) 50 MG tablet TAKE ONE TABLET BY MOUTH ONCE DAILY AS NEEDED   No facility-administered encounter medications on file as of 10/12/2016.    Hypertension  This is a chronic problem. The current episode started more than 1 year ago. The problem is unchanged. The problem is controlled. Pertinent negatives include no headaches, malaise/fatigue, palpitations or peripheral edema. Agents associated with hypertension include NSAIDs. Risk factors for coronary artery disease include dyslipidemia, obesity, post-menopausal state and sedentary lifestyle. Past treatments include diuretics, ACE inhibitors and beta blockers. The current treatment provides moderate improvement. Compliance problems include diet and exercise.   Hyperlipidemia  This is a chronic problem. The current episode started more than 1 year ago. Recent lipid tests were reviewed and are variable. Exacerbating diseases include diabetes and obesity. She has no history of hypothyroidism. Factors aggravating her hyperlipidemia include fatty foods. Current antihyperlipidemic treatment includes statins. The current treatment provides moderate improvement of lipids. Compliance problems include adherence to diet and adherence to exercise.  Risk factors for coronary artery disease include diabetes mellitus, dyslipidemia, hypertension, obesity, post-menopausal and a sedentary lifestyle.  Diabetes  She presents for her follow-up diabetic visit. She has type 2 diabetes mellitus. Her disease course has been stable. Pertinent negatives for hypoglycemia include no headaches. There are no diabetic associated symptoms. Pertinent negatives for diabetes include  no polydipsia, no polyphagia, no polyuria and no visual change. Symptoms are stable. Pertinent negatives for diabetic complications include no heart disease or nephropathy. Risk factors for coronary artery disease include diabetes mellitus, dyslipidemia, hypertension, obesity, post-menopausal and  sedentary lifestyle. Current diabetic treatment includes diet. She is compliant with treatment some of the time. Her weight is stable. She is following a high fat/cholesterol diet. When asked about meal planning, she reported none. She has not had a previous visit with a dietitian. She rarely participates in exercise. Her breakfast blood glucose is taken between 9-10 am. Her breakfast blood glucose range is generally 130-140 mg/dl. Her highest blood glucose is >200 mg/dl. Her overall blood glucose range is 140-180 mg/dl. An ACE inhibitor/angiotensin II receptor blocker is being taken. She does not see a podiatrist.Eye exam is current (August 2016).  Fibromyalgia Taking ultram and flexeril that makes pain tolerable- she has good days and bad days. States she does not take medication everyday if she does not need to.  GAD Xanax works well and only takes when she needs it. GERD Currently on omeprazole- keeps symptoms under control. Peripheral edema Reports she has seen a cardiologist, thinks it was January and is on same amount of lasix and edema has not been as bad. Put her on Amoldipine and took her off of metoprolol. COPD Currently on advair as needed - has not needed albuterol in awhile. States it is expensive and does not get it like she should.        Review of Systems  Constitutional: Negative.  Negative for malaise/fatigue.  HENT: Positive for hearing loss and sinus pressure.   Eyes: Negative.   Respiratory: Negative.   Cardiovascular: Negative.  Negative for palpitations.  Gastrointestinal: Negative.   Endocrine: Positive for cold intolerance. Negative for polydipsia, polyphagia and polyuria.  Genitourinary: Negative.  Negative for vaginal discharge.  Musculoskeletal: Negative.   Skin: Negative.   Neurological: Negative.  Negative for headaches.  Hematological: Negative.   Psychiatric/Behavioral: Negative.   All other systems reviewed and are negative.      Objective:    Physical Exam  Constitutional: She is oriented to person, place, and time. She appears well-developed and well-nourished.  HENT:  Right Ear: External ear normal.  Left Ear: External ear normal.  Nose: Nose normal.  Mouth/Throat: Oropharynx is clear and moist.  Eyes: EOM are normal.  Neck: Trachea normal, normal range of motion and full passive range of motion without pain. Neck supple. No JVD present. Carotid bruit is not present. No thyromegaly present.  Cardiovascular: Normal rate, regular rhythm, normal heart sounds and intact distal pulses.  Exam reveals no gallop and no friction rub.   No murmur heard. Pulmonary/Chest: Effort normal and breath sounds normal.  Abdominal: Soft. Bowel sounds are normal. She exhibits no distension and no mass. There is no tenderness.  Musculoskeletal: Normal range of motion. She exhibits edema (2+ bilaterally) and tenderness.       Right ankle: Normal.       Left ankle: Normal.       Arms: FROM of cervical spine without pain FROM of right shoulder with pain on abduction past 90 degrees Edema in both lower extremeties has improved.   Lymphadenopathy:    She has no cervical adenopathy.  Neurological: She is alert and oriented to person, place, and time. She has normal reflexes.  Multiple areas of tenderness up and down back  Skin: Skin is warm and dry.  Psychiatric: She has a normal mood and  affect. Her behavior is normal. Judgment and thought content normal.    BP 114/63   Pulse 74   Temp (!) 96.8 F (36 C) (Oral)   Ht _0  (1.575 m)   Wt 226 lb (102.5 kg)   BMI 41.34 kg/m            Assessment & Plan:  1. Essential hypertension Low sodium diet - CMP14+EGFR - amLODipine (NORVASC) 5 MG tablet; Take 1 tablet (5 mg total) by mouth daily.  Dispense: 180 tablet; Refill: 1 - benazepril (LOTENSIN) 40 MG tablet; Take 1 tablet (40 mg total) by mouth daily.  Dispense: 90 tablet; Refill: 1  2. Hyperlipidemia, unspecified hyperlipidemia  type Low fat diet - Lipid panel - atorvastatin (LIPITOR) 40 MG tablet; Take 1 tablet (40 mg total) by mouth daily.  Dispense: 30 tablet; Refill: 5  3. Simple chronic bronchitis ([place])  4. Gastroesophageal reflux disease without esophagitis Avoid spicy foods Do not eat 2 hours prior to bedtime - omeprazole (PRILOSEC) 40 MG capsule; TAKE 1 BY MOUTH DAILY --MARY-MARGARET Sheniya Garciaperez  Dispense: 90 capsule; Refill: 1  5. Type 2 diabetes mellitus without complication, without long-term current use of insulin (HCC) Stricter carb counting Added metformin to meds - Bayer DCA Hb A1c Waived - Microalbumin / creatinine urine ratio - metFORMIN (GLUCOPHAGE) 500 MG tablet; Take 1 tablet (500 mg total) by mouth 2 (two) times daily with a meal.  Dispense: 60 tablet; Refill: 5  6. Fibromyalgia muscle pain Encouraged exrcise  7. Other fatigue  8. GAD (generalized anxiety disorder) Stress management  9. Morbid obesity ([place]) Discussed diet and exercise for person with BMI >25 Will recheck weight in 3-6 months  10. Bilateral leg edema elevate legs when sitting - furosemide (LASIX) 40 MG tablet; TAKE 1 BY MOUTH EVERY MORNING AND 1/2 BY MOUTH AT 1 IN THE AFTERNOON  Dispense: 135 tablet; Refill: 1  11. Bilateral low back pain with bilateral sciatica, unspecified chronicity Back stretches   Keep appointment for colonoscopy Labs pending Health maintenance reviewed Diet and exercise encouraged Continue all meds Follow up  In 3 month   [place], FNP

## 2016-10-13 LAB — CMP14+EGFR
ALBUMIN: 4 g/dL (ref 3.6–4.8)
ALT: 25 IU/L (ref 0–32)
AST: 37 IU/L (ref 0–40)
Albumin/Globulin Ratio: 1.7 (ref 1.2–2.2)
Alkaline Phosphatase: 81 IU/L (ref 39–117)
BUN / CREAT RATIO: 24 (ref 12–28)
BUN: 19 mg/dL (ref 8–27)
Bilirubin Total: 2.6 mg/dL — ABNORMAL HIGH (ref 0.0–1.2)
CALCIUM: 8.9 mg/dL (ref 8.7–10.3)
CO2: 29 mmol/L (ref 18–29)
Chloride: 100 mmol/L (ref 96–106)
Creatinine, Ser: 0.78 mg/dL (ref 0.57–1.00)
GFR calc Af Amer: 90 mL/min/{1.73_m2} (ref >59)
GFR, EST NON AFRICAN AMERICAN: 78 mL/min/{1.73_m2} (ref >59)
GLOBULIN, TOTAL: 2.3 g/dL (ref 1.5–4.5)
GLUCOSE: 139 mg/dL — AB (ref 65–99)
Potassium: 4.3 mmol/L (ref 3.5–5.2)
SODIUM: 144 mmol/L (ref 134–144)
TOTAL PROTEIN: 6.3 g/dL (ref 6.0–8.5)

## 2016-10-13 LAB — LIPID PANEL
CHOL/HDL RATIO: 2.9 ratio (ref 0.0–4.4)
CHOLESTEROL TOTAL: 115 mg/dL (ref 100–199)
HDL: 40 mg/dL (ref >39)
LDL CALC: 59 mg/dL (ref 0–99)
Triglycerides: 81 mg/dL (ref 0–149)
VLDL CHOLESTEROL CAL: 16 mg/dL (ref 5–40)

## 2016-11-07 DIAGNOSIS — G4733 Obstructive sleep apnea (adult) (pediatric): Secondary | ICD-10-CM | POA: Diagnosis not present

## 2016-11-14 ENCOUNTER — Telehealth: Payer: Self-pay | Admitting: *Deleted

## 2016-11-14 NOTE — Telephone Encounter (Signed)
Fax received from [facility]  For purpose of order approval of supplies  Reviewed and signed by provider Dr. Claiborne Billings on 11/14/16  Faxed back to requesting party on 11/14/16

## 2016-11-17 ENCOUNTER — Telehealth: Payer: Self-pay | Admitting: *Deleted

## 2016-11-17 ENCOUNTER — Ambulatory Visit (AMBULATORY_SURGERY_CENTER): Payer: Self-pay | Admitting: *Deleted

## 2016-11-17 VITALS — Ht 61.5 in | Wt 228.0 lb

## 2016-11-17 DIAGNOSIS — Z86010 Personal history of colonic polyps: Secondary | ICD-10-CM

## 2016-11-17 NOTE — Telephone Encounter (Signed)
Faxed CPAP full face order to [facility].

## 2016-11-17 NOTE — Progress Notes (Signed)
No egg or soy allergy known to patient  No issues with past sedation with any surgeries  or procedures, no intubation problems  No diet pills per patient No home 02 use per patient  No blood thinners per patient  Pt denies issues with constipation  No A fib or A flutter  emmi video to e mail

## 2016-11-22 ENCOUNTER — Encounter: Payer: Self-pay | Admitting: Gastroenterology

## 2016-11-30 ENCOUNTER — Ambulatory Visit (INDEPENDENT_AMBULATORY_CARE_PROVIDER_SITE_OTHER): Payer: PPO | Admitting: Physician Assistant

## 2016-11-30 ENCOUNTER — Encounter: Payer: Self-pay | Admitting: Physician Assistant

## 2016-11-30 VITALS — BP 120/78 | HR 79 | Temp 98.7°F | Ht 61.5 in | Wt 222.0 lb

## 2016-11-30 DIAGNOSIS — J40 Bronchitis, not specified as acute or chronic: Secondary | ICD-10-CM | POA: Diagnosis not present

## 2016-11-30 MED ORDER — GUAIFENESIN-CODEINE 100-10 MG/5ML PO SYRP: 5.0000 mL | ORAL_SOLUTION | Freq: Four times a day (QID) | ORAL | 0 refills | Status: DC | PRN

## 2016-11-30 MED ORDER — AZITHROMYCIN 250 MG PO TABS: ORAL_TABLET | ORAL | 0 refills | Status: DC

## 2016-11-30 MED ORDER — PREDNISONE 10 MG (21) PO TBPK: ORAL_TABLET | ORAL | 0 refills | Status: DC

## 2016-11-30 NOTE — Patient Instructions (Signed)
Bronchitis  Acute Bronchitis, Adult Acute bronchitis is when air tubes (bronchi) in the lungs suddenly get swollen. The condition can make it hard to breathe. It can also cause these symptoms:  A cough.  Coughing up clear, yellow, or green mucus.  Wheezing.  Chest congestion.  Shortness of breath.  A fever.  Body aches.  Chills.  A sore throat. Follow these instructions at home: Medicines   Take over-the-counter and prescription medicines only as told by your doctor.  If you were prescribed an antibiotic medicine, take it as told by your doctor. Do not stop taking the antibiotic even if you start to feel better. General instructions   Rest.  Drink enough fluids to keep your pee (urine) clear or pale yellow.  Avoid smoking and secondhand smoke. If you smoke and you need help quitting, ask your doctor. Quitting will help your lungs heal faster.  Use an inhaler, cool mist vaporizer, or humidifier as told by your doctor.  Keep all follow-up visits as told by your doctor. This is important. How is this prevented? To lower your risk of getting this condition again:  Wash your hands often with soap and water. If you cannot use soap and water, use hand sanitizer.  Avoid contact with people who have cold symptoms.  Try not to touch your hands to your mouth, nose, or eyes.  Make sure to get the flu shot every year. Contact a doctor if:  Your symptoms do not get better in 2 weeks. Get help right away if:  You cough up blood.  You have chest pain.  You have very bad shortness of breath.  You become dehydrated.  You faint (pass out) or keep feeling like you are going to pass out.  You keep throwing up (vomiting).  You have a very bad headache.  Your fever or chills gets worse. This information is not intended to replace advice given to you by your health care provider. Make sure you discuss any questions you have with your health care provider. Document  Released: 01/18/2008 Document Revised: 03/09/2016 Document Reviewed: 01/20/2016 Elsevier Interactive Patient Education  2017 [employer].

## 2016-12-01 ENCOUNTER — Encounter: Payer: PPO | Admitting: Gastroenterology

## 2016-12-01 NOTE — Progress Notes (Signed)
BP 120/78 (BP Location: Right Wrist)   Pulse 79   Temp 98.7 F (37.1 C) (Oral)   Ht 5' 1.5" (1.562 m)   Wt 222 lb (100.7 kg)   BMI 41.27 kg/m    Subjective:    Patient ID: Stacey Summers, female    DOB: [birth year], 71 y.o.   MRN: [id number]  HPI: Stacey Summers is a 71 y.o. female presenting on 11/30/2016 for Cough and Nasal Congestion (fever last night)  Patient with several days of progressing upper respiratory and bronchial symptoms. Initially there was more upper respiratory congestion. This progressed to having significant cough that is productive throughout the day and severe at night. There is occasional wheezing after coughing. Sometimes there is slight dyspnea on exertion. It is productive mucus that is yellow in color. Denies any blood.  Relevant past medical, surgical, family and social history reviewed and updated as indicated. Allergies and medications reviewed and updated.  Past Medical History:  Diagnosis Date  . Allergy    seasonal  . Anxiety   . Arthritis    hands, back   . Asthma    bronchitis  . Cataract    removed both eyes  . Chronic back pain    lower back   . Diabetes mellitus without complication ([place])   . DJD (degenerative joint disease)   . GERD (gastroesophageal reflux disease)   . History of abnormal mammogram   . Hyperlipidemia   . Hypertension   . Irregular heart beat   . Neuromuscular disorder (HCC)    fibromyalgia  . Sleep apnea    cpap some  . Tubular adenoma of colon 05/09/12   "innumerable number" of flat polyps    Past Surgical History:  Procedure Laterality Date  . CARPAL TUNNEL RELEASE Left 10/20/14   [facility]  . [place]  . CHOLECYSTECTOMY  1990  . COLONOSCOPY    . LARYNX SURGERY     POLYP REMOVAL  . POLYPECTOMY     vocal cord  . TONSILLECTOMY  1965  . TUMOR REMOVAL     benign fatty tumors on abdomen    Review of Systems  Constitutional: Positive for chills and fatigue. Negative for activity  change, appetite change and fever.  HENT: Positive for congestion, postnasal drip and sore throat.   Eyes: Negative.   Respiratory: Positive for cough. Negative for shortness of breath and wheezing.   Cardiovascular: Negative.  Negative for chest pain, palpitations and leg swelling.  Gastrointestinal: Negative.   Genitourinary: Negative.   Musculoskeletal: Negative.   Skin: Negative.   Neurological: Positive for headaches.    Allergies as of 11/30/2016   No Known Allergies     Medication List       Accurate as of 11/30/16 11:59 PM. Always use your most recent med list.          ALPRAZolam 0.25 MG tablet Commonly known as:  XANAX Take 1 tablet (0.25 mg total) by mouth 2 (two) times daily as needed.   amLODipine 5 MG tablet Commonly known as:  NORVASC Take 1 tablet (5 mg total) by mouth daily.   aspirin 81 MG tablet Take 81 mg by mouth as needed for pain.   atorvastatin 40 MG tablet Commonly known as:  LIPITOR Take 1 tablet (40 mg total) by mouth daily.   azithromycin 250 MG tablet Commonly known as:  ZITHROMAX Z-PAK Take as directed   benazepril 40 MG tablet Commonly known as:  LOTENSIN Take  1 tablet (40 mg total) by mouth daily.   CALCIUM SOFT CHEWS [phone] MG-UNT-MCG Chew Generic drug:  Calcium-Vitamin D-Vitamin K Chew 1 each by mouth daily.   cholecalciferol 1000 units tablet Commonly known as:  VITAMIN D Take 1,000 Units by mouth daily.   cyclobenzaprine 5 MG tablet Commonly known as:  FLEXERIL TAKE 1 BY MOUTH 3 TIMES DAILY AS NEEDED FOR MUSCLE SPASMS  --MARY MARTIN NP   Fluticasone-Salmeterol 100-50 MCG/DOSE Aepb Commonly known as:  ADVAIR Inhale 1 puff into the lungs 2 (two) times daily.   furosemide 40 MG tablet Commonly known as:  LASIX TAKE 1 BY MOUTH EVERY MORNING AND 1/2 BY MOUTH AT 1 IN THE AFTERNOON   glucose blood test strip Test 1x per day and prn- dx 250.02   ONE TOUCH ULTRA TEST test strip Generic drug:  glucose blood USE ONE  STRIP TO CHECK GLUCOSE ONCE DAILY   guaiFENesin-codeine 100-10 MG/5ML syrup Commonly known as:  ROBITUSSIN AC Take 5-10 mLs by mouth 4 (four) times daily as needed for cough.   LIBERTY BLOOD GLUCOSE MONITOR Devi 1 each by Does not apply route daily. Test 1x per day and prn-dx250.02   metFORMIN 500 MG tablet Commonly known as:  GLUCOPHAGE Take 1 tablet (500 mg total) by mouth 2 (two) times daily with a meal.   metolazone 2.5 MG tablet Commonly known as:  ZAROXOLYN 1 po 2x a week as needed   omeprazole 40 MG capsule Commonly known as:  PRILOSEC TAKE 1 BY MOUTH DAILY --[name]   ONETOUCH DELICA LANCETS 97W Misc 1 each by Does not apply route daily. Test 1X per day and prn-- dx 250.02   predniSONE 10 MG (21) Tbpk tablet Commonly known as:  STERAPRED UNI-PAK 21 TAB As directed x 6 days   traMADol 50 MG tablet Commonly known as:  ULTRAM Take 1 tablet (50 mg total) by mouth daily as needed.          Objective:    BP 120/78 (BP Location: Right Wrist)   Pulse 79   Temp 98.7 F (37.1 C) (Oral)   Ht 5' 1.5" (1.562 m)   Wt 222 lb (100.7 kg)   BMI 41.27 kg/m   No Known Allergies  Physical Exam  Constitutional: She is oriented to person, place, and time. She appears well-developed and well-nourished.  HENT:  Head: Normocephalic and atraumatic.  Right Ear: There is drainage and tenderness.  Left Ear: There is drainage and tenderness.  Nose: Mucosal edema and rhinorrhea present. Right sinus exhibits maxillary sinus tenderness and frontal sinus tenderness. Left sinus exhibits maxillary sinus tenderness and frontal sinus tenderness.  Mouth/Throat: Oropharyngeal exudate and posterior oropharyngeal erythema present.  Eyes: Conjunctivae and EOM are normal. Pupils are equal, round, and reactive to light.  Neck: Normal range of motion. Neck supple.  Cardiovascular: Normal rate, regular rhythm, normal heart sounds and intact distal pulses.   Pulmonary/Chest: Effort  normal. She has wheezes in the right upper field and the left upper field.  Abdominal: Soft. Bowel sounds are normal.  Neurological: She is alert and oriented to person, place, and time. She has normal reflexes.  Skin: Skin is warm and dry. No rash noted.  Psychiatric: She has a normal mood and affect. Her behavior is normal. Judgment and thought content normal.        Assessment & Plan:   1. Bronchitis - azithromycin (ZITHROMAX Z-PAK) 250 MG tablet; Take as directed  Dispense: 6 each; Refill: 0 - predniSONE (  STERAPRED UNI-PAK 21 TAB) 10 MG (21) TBPK tablet; As directed x 6 days  Dispense: 21 tablet; Refill: 0 - guaiFENesin-codeine (ROBITUSSIN AC) 100-10 MG/5ML syrup; Take 5-10 mLs by mouth 4 (four) times daily as needed for cough.  Dispense: 240 mL; Refill: 0   Continue all other maintenance medications as listed above.  Follow up plan: Return if symptoms worsen or fail to improve.  Educational handout given for bronchitis  Terald Sleeper PA-C [facility] [address]  [place], [place] [address] [phone]   12/01/2016, 1:30 PM

## 2016-12-06 ENCOUNTER — Encounter: Payer: Self-pay | Admitting: Gastroenterology

## 2016-12-06 ENCOUNTER — Ambulatory Visit (AMBULATORY_SURGERY_CENTER): Payer: PPO | Admitting: Gastroenterology

## 2016-12-06 VITALS — BP 131/54 | HR 74 | Temp 96.6°F | Resp 20 | Ht 61.5 in | Wt 228.0 lb

## 2016-12-06 DIAGNOSIS — D124 Benign neoplasm of descending colon: Secondary | ICD-10-CM | POA: Diagnosis not present

## 2016-12-06 DIAGNOSIS — Z86010 Personal history of colonic polyps: Secondary | ICD-10-CM | POA: Diagnosis not present

## 2016-12-06 DIAGNOSIS — G4733 Obstructive sleep apnea (adult) (pediatric): Secondary | ICD-10-CM | POA: Diagnosis not present

## 2016-12-06 DIAGNOSIS — D122 Benign neoplasm of ascending colon: Secondary | ICD-10-CM

## 2016-12-06 DIAGNOSIS — D123 Benign neoplasm of transverse colon: Secondary | ICD-10-CM

## 2016-12-06 DIAGNOSIS — Z1211 Encounter for screening for malignant neoplasm of colon: Secondary | ICD-10-CM | POA: Diagnosis not present

## 2016-12-06 DIAGNOSIS — Z800 Family history of malignant neoplasm of digestive organs: Secondary | ICD-10-CM | POA: Diagnosis not present

## 2016-12-06 MED ORDER — SODIUM CHLORIDE 0.9 % IV SOLN: 500.0000 mL | INTRAVENOUS | Status: DC

## 2016-12-06 NOTE — Op Note (Signed)
[facility] Patient Name: Stacey Summers Procedure Date: 12/06/2016 9:42 AM MRN: [id number] Endoscopist: Mauri Pole , MD Age: 71 Referring MD:  Date of Birth: 1946/02/10 Gender: Female Account #: [id number] Procedure:                Colonoscopy Indications:              Colon cancer screening in patient at increased                            risk: Colorectal cancer in father, Colon cancer                            screening in patient at increased risk: Colorectal                            cancer in brother, Surveillance: Personal history                            of adenomatous polyps on last colonoscopy 3 years                            ago, Surveillance: History of numerous (> 10)                            adenomas on last colonoscopy (< 3 yrs) Medicines:                Monitored Anesthesia Care Procedure:                Pre-Anesthesia Assessment:                           - Prior to the procedure, a History and Physical                            was performed, and patient medications and                            allergies were reviewed. The patient's tolerance of                            previous anesthesia was also reviewed. The risks                            and benefits of the procedure and the sedation                            options and risks were discussed with the patient.                            All questions were answered, and informed consent                            was obtained. Prior Anticoagulants: The patient has  taken no previous anticoagulant or antiplatelet                            agents. ASA Grade Assessment: III - A patient with                            severe systemic disease. After reviewing the risks                            and benefits, the patient was deemed in                            satisfactory condition to undergo the procedure.                           After obtaining  informed consent, the colonoscope                            was passed under direct vision. Throughout the                            procedure, the patient's blood pressure, pulse, and                            oxygen saturations were monitored continuously. The                            Colonoscope was introduced through the anus and                            advanced to the the terminal ileum, with                            identification of the appendiceal orifice and IC                            valve. The colonoscopy was performed without                            difficulty. The patient tolerated the procedure                            well. The quality of the bowel preparation was                            excellent. The terminal ileum, ileocecal valve,                            appendiceal orifice, and rectum were photographed. Scope In: 10:01:24 AM Scope Out: 10:33:04 AM Scope Withdrawal Time: 0 hours 25 minutes 21 seconds  Total Procedure Duration: 0 hours 31 minutes 40 seconds  Findings:                 The perianal and digital rectal examinations were  normal.                           Five sessile polyps were found in the descending                            colon X1 , transverse colon X4 and ascending                            colonX1 . The polyps were 5 to 8 mm in size. These                            polyps were removed with a cold snare. Resection                            and retrieval were complete. Brisk ooze noted at                            polypectomy site in distal transverse colon,                            cuatery with snare tip was unsuccessful. Placed                            hemostatic clip with good hemostasis                           A 18 mm polyp was found in the ascending colon. The                            polyp was sessile. The polyp was removed with a                            piecemeal technique using a  hot snare. Resection                            and retrieval were complete.                           Scattered small and large-mouthed diverticula were                            found in the sigmoid colon. Complications:            No immediate complications. Estimated Blood Loss:     Estimated blood loss was minimal. Impression:               - Five 5 to 8 mm polyps in the descending colon, in                            the transverse colon and in the ascending colon,                            removed with a  cold snare. Resected and retrieved.                           - One 18 mm polyp in the ascending colon, removed                            piecemeal using a hot snare. Resected and retrieved.                           - Diverticulosis in the sigmoid colon. Recommendation:           - Patient has a contact number available for                            emergencies. The signs and symptoms of potential                            delayed complications were discussed with the                            patient. Return to normal activities tomorrow.                            Written discharge instructions were provided to the                            patient.                           - Resume previous diet.                           - Continue present medications.                           - Await pathology results.                           - Repeat colonoscopy in 1 year for surveillance                            after piecemeal polypectomy.                           - Refer to genetic counselling to exclude                            attenuated FAP or Lynch syndrome Mauri Pole, MD 12/06/2016 10:43:34 AM This report has been signed electronically.

## 2016-12-06 NOTE — Progress Notes (Signed)
Called to room to assist during endoscopic procedure.  Patient ID and intended procedure confirmed with present staff. Received instructions for my participation in the procedure from the performing physician.  

## 2016-12-06 NOTE — Patient Instructions (Signed)
YOU HAD AN ENDOSCOPIC PROCEDURE TODAY AT [facility]:   Refer to the procedure report that was given to you for any specific questions about what was found during the examination.  If the procedure report does not answer your questions, please call your gastroenterologist to clarify.  If you requested that your care partner not be given the details of your procedure findings, then the procedure report has been included in a sealed envelope for you to review at your convenience later.  YOU SHOULD EXPECT: Some feelings of bloating in the abdomen. Passage of more gas than usual.  Walking can help get rid of the air that was put into your GI tract during the procedure and reduce the bloating. If you had a lower endoscopy (such as a colonoscopy or flexible sigmoidoscopy) you may notice spotting of blood in your stool or on the toilet paper. If you underwent a bowel prep for your procedure, you may not have a normal bowel movement for a few days.  Please Note:  You might notice some irritation and congestion in your nose or some drainage.  This is from the oxygen used during your procedure.  There is no need for concern and it should clear up in a day or so.  SYMPTOMS TO REPORT IMMEDIATELY:   Following lower endoscopy (colonoscopy or flexible sigmoidoscopy):  Excessive amounts of blood in the stool  Significant tenderness or worsening of abdominal pains  Swelling of the abdomen that is new, acute  Fever of 100F or higher    For urgent or emergent issues, a gastroenterologist can be reached at any hour by calling [phone].   DIET:  We do recommend a small meal at first, but then you may proceed to your regular diet.  Drink plenty of fluids but you should avoid alcoholic beverages for 24 hours.  ACTIVITY:  You should plan to take it easy for the rest of today and you should NOT DRIVE or use heavy machinery until tomorrow (because of the sedation medicines used during the test).     FOLLOW UP: Our staff will call the number listed on your records the next business day following your procedure to check on you and address any questions or concerns that you may have regarding the information given to you following your procedure. If we do not reach you, we will leave a message.  However, if you are feeling well and you are not experiencing any problems, there is no need to return our call.  We will assume that you have returned to your regular daily activities without incident.  If any biopsies were taken you will be contacted by phone or by letter within the next 1-3 weeks.  Please call us at [phone] if you have not heard about the biopsies in 3 weeks.    SIGNATURES/CONFIDENTIALITY: You and/or your care partner have signed paperwork which will be entered into your electronic medical record.  These signatures attest to the fact that that the information above on your After Visit Summary has been reviewed and is understood.  Full responsibility of the confidentiality of this discharge information lies with you and/or your care-partner.   Resume medications. Information given on polyps and diverticulosis.

## 2016-12-06 NOTE — Progress Notes (Signed)
A/ox3 pleased with MAC, report to [facility]

## 2016-12-06 NOTE — Progress Notes (Signed)
Pt's states no medical or surgical changes since previsit or office visit. maw

## 2016-12-07 ENCOUNTER — Telehealth: Payer: Self-pay

## 2016-12-07 NOTE — Telephone Encounter (Signed)
  Follow up Call-  Call back number 12/06/2016  Post procedure Call Back phone  # [phone] cell  Permission to leave phone message Yes  Some recent data might be hidden     Patient questions:  Do you have a fever, pain , or abdominal swelling? No. Pain Score  0 *  Have you tolerated food without any problems? Yes.    Have you been able to return to your normal activities? Yes.    Do you have any questions about your discharge instructions: Diet   No. Medications  No. Follow up visit  No.  Do you have questions or concerns about your Care? No.  Actions: * If pain score is 4 or above: No action needed, pain <4.

## 2016-12-09 ENCOUNTER — Encounter: Payer: Self-pay | Admitting: Gastroenterology

## 2016-12-12 ENCOUNTER — Other Ambulatory Visit: Payer: Self-pay

## 2016-12-12 DIAGNOSIS — K635 Polyp of colon: Secondary | ICD-10-CM

## 2016-12-30 ENCOUNTER — Other Ambulatory Visit: Payer: Self-pay | Admitting: Nurse Practitioner

## 2017-01-02 ENCOUNTER — Other Ambulatory Visit: Payer: Self-pay | Admitting: Nurse Practitioner

## 2017-01-06 ENCOUNTER — Ambulatory Visit (INDEPENDENT_AMBULATORY_CARE_PROVIDER_SITE_OTHER): Payer: PPO | Admitting: Nurse Practitioner

## 2017-01-06 ENCOUNTER — Encounter: Payer: Self-pay | Admitting: Nurse Practitioner

## 2017-01-06 VITALS — BP 119/62 | HR 81 | Temp 97.1°F | Ht 61.5 in | Wt 221.0 lb

## 2017-01-06 DIAGNOSIS — G8929 Other chronic pain: Secondary | ICD-10-CM | POA: Diagnosis not present

## 2017-01-06 DIAGNOSIS — M5442 Lumbago with sciatica, left side: Secondary | ICD-10-CM | POA: Diagnosis not present

## 2017-01-06 MED ORDER — KETOROLAC TROMETHAMINE 60 MG/2ML IM SOLN
60.0000 mg | Freq: Once | INTRAMUSCULAR | Status: AC
  Administered 2017-01-06: 60 mg via INTRAMUSCULAR

## 2017-01-06 MED ORDER — TRAMADOL HCL 50 MG PO TABS: 50.0000 mg | ORAL_TABLET | Freq: Every day | ORAL | 0 refills | Status: DC | PRN

## 2017-01-06 MED ORDER — CYCLOBENZAPRINE HCL 5 MG PO TABS: ORAL_TABLET | ORAL | 0 refills | Status: DC

## 2017-01-06 NOTE — Addendum Note (Signed)
Addended by: Ilean China on: 01/06/2017 05:36 PM   Modules accepted: Orders

## 2017-01-06 NOTE — Progress Notes (Signed)
   Subjective:    Patient ID: Stacey Summers, female    DOB: 1945/11/19, 71 y.o.   MRN: [id number]  HPI  Patient comes in today c/o low back pain that is radiating down  To her left leg to her knee. Rats pain 8/10. different then her usual back pain.   Review of Systems  Constitutional: Negative.   Respiratory: Negative.   Cardiovascular: Negative.   Musculoskeletal: Positive for back pain.  Neurological: Negative.   Psychiatric/Behavioral: Negative.   All other systems reviewed and are negative.      Objective:   Physical Exam  Constitutional: She is oriented to person, place, and time. She appears well-developed and well-nourished. No distress.  Cardiovascular: Normal rate.   Pulmonary/Chest: Effort normal and breath sounds normal.  Musculoskeletal:  Decrease ROM of lumbar spine with pain on flexion and rotation to left (+) SLR on left Motor strength and sensation distally intact  Neurological: She is alert and oriented to person, place, and time.  Skin: Skin is warm.  Psychiatric: She has a normal mood and affect. Her behavior is normal. Judgment and thought content normal.   BP 119/62 (BP Location: Right Wrist, Patient Position: Sitting, Cuff Size: Normal)   Pulse 81   Temp 97.1 F (36.2 C) (Oral)   Ht 5' 1.5" (1.562 m)   Wt 221 lb (100.2 kg)   BMI 41.08 kg/m       Assessment & Plan:  1. Chronic left-sided low back pain with left-sided sciatica Moist heat Rest No heavy lifting, bending or stooping RTO prn  - cyclobenzaprine (FLEXERIL) 5 MG tablet; TAKE 1 BY MOUTH 3 TIMES DAILY AS NEEDED FOR MUSCLE SPASMS  --Ronnald Collum NP  Dispense: 30 tablet; Refill: 0 - traMADol (ULTRAM) 50 MG tablet; Take 1 tablet (50 mg total) by mouth daily as needed.  Dispense: 30 tablet; Refill: 0  Mary-Margaret Hassell Done, FNP

## 2017-01-06 NOTE — Patient Instructions (Signed)
Sciatica Sciatica is pain, numbness, weakness, or tingling along your sciatic nerve. The sciatic nerve starts in the lower back and goes down the back of each leg. Sciatica happens when this nerve is pinched or has pressure put on it. Sciatica usually goes away on its own or with treatment. Sometimes, sciatica may keep coming back (recur). Follow these instructions at home: Medicines   Take over-the-counter and prescription medicines only as told by your doctor.  Do not drive or use heavy machinery while taking prescription pain medicine. Managing pain   If directed, put ice on the affected area.  Put ice in a plastic bag.  Place a towel between your skin and the bag.  Leave the ice on for 20 minutes, 2-3 times a day.  After icing, apply heat to the affected area before you exercise or as often as told by your doctor. Use the heat source that your doctor tells you to use, such as a moist heat pack or a heating pad.  Place a towel between your skin and the heat source.  Leave the heat on for 20-30 minutes.  Remove the heat if your skin turns bright red. This is especially important if you are unable to feel pain, heat, or cold. You may have a greater risk of getting burned. Activity   Return to your normal activities as told by your doctor. Ask your doctor what activities are safe for you.  Avoid activities that make your sciatica worse.  Take short rests during the day. Rest in a lying or standing position. This is usually better than sitting to rest.  When you rest for a long time, do some physical activity or stretching between periods of rest.  Avoid sitting for a long time without moving. Get up and move around at least one time each hour.  Exercise and stretch regularly, as told by your doctor.  Do not lift anything that is heavier than 10 lb (4.5 kg) while you have symptoms of sciatica.  Avoid lifting heavy things even when you do not have symptoms.  Avoid lifting  heavy things over and over.  When you lift objects, always lift in a way that is safe for your body. To do this, you should:  Bend your knees.  Keep the object close to your body.  Avoid twisting. General instructions   Use good posture.  Avoid leaning forward when you are sitting.  Avoid hunching over when you are standing.  Stay at a healthy weight.  Wear comfortable shoes that support your feet. Avoid wearing high heels.  Avoid sleeping on a mattress that is too soft or too hard. You might have less pain if you sleep on a mattress that is firm enough to support your back.  Keep all follow-up visits as told by your doctor. This is important. Contact a doctor if:  You have pain that:  Wakes you up when you are sleeping.  Gets worse when you lie down.  Is worse than the pain you have had in the past.  Lasts longer than 4 weeks.  You lose weight for without trying. Get help right away if:  You cannot control when you pee (urinate) or poop (have a bowel movement).  You have weakness in any of these areas and it gets worse.  Lower back.  Lower belly (pelvis).  Butt (buttocks).  Legs.  You have redness or swelling of your back.  You have a burning feeling when you pee. This information is  not intended to replace advice given to you by your health care provider. Make sure you discuss any questions you have with your health care provider. Document Released: 05/10/2008 Document Revised: 01/07/2016 Document Reviewed: 04/10/2015 Elsevier Interactive Patient Education  2017 [employer].

## 2017-01-10 ENCOUNTER — Encounter: Payer: Self-pay | Admitting: Family Medicine

## 2017-01-10 ENCOUNTER — Ambulatory Visit (INDEPENDENT_AMBULATORY_CARE_PROVIDER_SITE_OTHER): Payer: PPO | Admitting: Family Medicine

## 2017-01-10 VITALS — BP 131/75 | HR 103 | Temp 97.4°F | Ht 61.5 in | Wt 221.0 lb

## 2017-01-10 DIAGNOSIS — B029 Zoster without complications: Secondary | ICD-10-CM

## 2017-01-10 MED ORDER — VALACYCLOVIR HCL 1 G PO TABS: 1000.0000 mg | ORAL_TABLET | Freq: Three times a day (TID) | ORAL | 0 refills | Status: AC

## 2017-01-10 NOTE — Progress Notes (Signed)
Subjective:  Patient ID: Stacey Summers, female    DOB: 25-Mar-1946  Age: 71 y.o. MRN: [id number]  CC: Herpes Zoster (pt here today c/o what she thinks might be shingles on her right leg)   HPI [name] presents for 3 days of rash on the right thigh and on the right buttocks. It is painful and burning. 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 She is concerned about them getting infection.  History Stacey Summers has a past medical history of Allergy; Anxiety; Arthritis; Asthma; Cataract; Chronic back pain; COPD (chronic obstructive pulmonary disease) ([place]); Diabetes mellitus without complication ([place]); DJD (degenerative joint disease); Fibromyalgia; GERD (gastroesophageal reflux disease); History of abnormal mammogram; Hyperlipidemia; Hypertension; Irregular heart beat; Neuromuscular disorder ([place]); Sleep apnea; and Tubular adenoma of colon (05/09/12).   She has a past surgical history that includes Tonsillectomy (1965); Cholecystectomy (1990); Cesarean section (1977); Tumor removal; Polypectomy; Larynx surgery; Carpal tunnel release (Left, 10/20/14); and Colonoscopy.   Her family history includes Brain cancer (age of onset: 51) in her cousin; Cancer in her brother, father, other, paternal aunt, and paternal uncle; Colon cancer in her father, other, and paternal uncle; Colon cancer (age of onset: 32) in her brother; Colon polyps in her brother and son; Heart attack (age of onset: 85) in her father; Heart disease in her mother; Heart failure in her maternal grandmother, maternal uncle, and mother; Kidney cancer in her cousin; Other in her brother and paternal uncle; Other (age of onset: 70) in her other; Prostate cancer in her father; Spina bifida in her brother; Stomach cancer in her paternal aunt.She reports that she quit smoking about 47 years ago. Her smoking use included Cigarettes. She has  a 3.00 pack-year smoking history. She has never used smokeless tobacco. She reports that she does not drink alcohol or use drugs.  Current Outpatient Prescriptions on File Prior to Visit  Medication Sig Dispense Refill  . ALPRAZolam (XANAX) 0.25 MG tablet Take 1 tablet (0.25 mg total) by mouth 2 (two) times daily as needed. 30 tablet 2  . amLODipine (NORVASC) 5 MG tablet Take 1 tablet (5 mg total) by mouth daily. 180 tablet 1  . aspirin 81 MG tablet Take 81 mg by mouth as needed for pain.    Marland Kitchen atorvastatin (LIPITOR) 40 MG tablet Take 1 tablet (40 mg total) by mouth daily. 30 tablet 5  . benazepril (LOTENSIN) 40 MG tablet Take 1 tablet (40 mg total) by mouth daily. 90 tablet 1  . Blood Glucose Monitoring Suppl (LIBERTY BLOOD GLUCOSE MONITOR) DEVI 1 each by Does not apply route daily. Test 1x per day and prn-dx250.02 1 each 0  . Calcium-Vitamin D-Vitamin K (CALCIUM SOFT CHEWS) 500-100-40 MG-UNT-MCG CHEW Chew 1 each by mouth daily.    . cholecalciferol (VITAMIN D) 1000 UNITS tablet Take 1,000 Units by mouth daily.     . cyclobenzaprine (FLEXERIL) 5 MG tablet TAKE 1 BY MOUTH 3 TIMES DAILY AS NEEDED FOR MUSCLE SPASMS  --Stacey MARTIN NP 30 tablet 0  . Fluticasone-Salmeterol (ADVAIR) 100-50 MCG/DOSE AEPB Inhale 1 puff into the lungs 2 (two) times daily. 28 each 0  . furosemide (LASIX) 40 MG tablet TAKE 1 BY MOUTH EVERY MORNING AND 1/2 BY MOUTH AT 1 IN THE AFTERNOON 135 tablet 1  . glucose blood test strip Test 1x per day and prn- dx 250.02 100 each  2  . guaiFENesin-codeine (ROBITUSSIN AC) 100-10 MG/5ML syrup Take 5-10 mLs by mouth 4 (four) times daily as needed for cough. 240 mL 0  . metFORMIN (GLUCOPHAGE) 500 MG tablet Take 1 tablet (500 mg total) by mouth 2 (two) times daily with a meal. 60 tablet 5  . metolazone (ZAROXOLYN) 2.5 MG tablet 1 po 2x a week as needed 10 tablet 1  . omeprazole (PRILOSEC) 40 MG capsule TAKE 1 BY MOUTH DAILY --Stacey Summers 90 capsule 1  . ONE TOUCH ULTRA TEST test  strip USE ONE STRIP TO CHECK GLUCOSE ONCE DAILY 100 each 0  . ONETOUCH DELICA LANCETS 18E MISC 1 each by Does not apply route daily. Test 1X per day and prn-- dx 250.02 100 each 11  . traMADol (ULTRAM) 50 MG tablet Take 1 tablet (50 mg total) by mouth daily as needed. 30 tablet 0   Current Facility-Administered Medications on File Prior to Visit  Medication Dose Route Frequency Provider Last Rate Last Dose  . 0.9 %  sodium chloride infusion  500 mL Intravenous Continuous Nandigam, Kavitha V, MD        ROS Review of Systems  Constitutional: Negative for activity change, appetite change and fever.  HENT: Negative for congestion, rhinorrhea and sore throat.   Eyes: Negative for visual disturbance.  Respiratory: Negative for cough and shortness of breath.   Cardiovascular: Negative for chest pain and palpitations.  Gastrointestinal: Negative for abdominal pain, diarrhea and nausea.  Genitourinary: Negative for dysuria.  Musculoskeletal: Negative for arthralgias and myalgias.    Objective:  BP 131/75   Pulse (!) 103   Temp 97.4 F (36.3 C) (Oral)   Ht 5' 1.5" (1.562 m)   Wt 221 lb (100.2 kg)   BMI 41.08 kg/m   Physical Exam  Constitutional: She is oriented to person, place, and time. She appears well-developed and well-nourished. No distress.  HENT:  Head: Normocephalic.  Eyes: EOM are normal. Pupils are equal, round, and reactive to light.  Cardiovascular: Normal rate and regular rhythm.   Pulmonary/Chest: Breath sounds normal.  Neurological: She is alert and oriented to person, place, and time.  Skin: Skin is warm and dry. Rash noted.  The rash is typical shingles appearing this vesicles in clusters with an erythematous base. This is in a 3 x 5 centimeter patch at the distal anterior right thigh. There is another that is 3 cm at the right lateral buttocks.  Psychiatric: She has a normal mood and affect.    Assessment & Plan:   Taniya was seen today for herpes  zoster.  Diagnoses and all orders for this visit:  Herpes zoster without complication  Other orders -     valACYclovir (VALTREX) 1000 MG tablet; Take 1 tablet (1,000 mg total) by mouth 3 (three) times daily.   I am having Ms. Ventresca start on valACYclovir. I am also having her maintain her cholecalciferol, aspirin, LIBERTY BLOOD GLUCOSE MONITOR, ONETOUCH DELICA LANCETS 99B, glucose blood, Calcium-Vitamin D-Vitamin K, ALPRAZolam, metolazone, ONE TOUCH ULTRA TEST, Fluticasone-Salmeterol, furosemide, amLODipine, benazepril, omeprazole, atorvastatin, metFORMIN, guaiFENesin-codeine, cyclobenzaprine, and traMADol. We will continue to administer sodium chloride.  Meds ordered this encounter  Medications  . valACYclovir (VALTREX) 1000 MG tablet    Sig: Take 1 tablet (1,000 mg total) by mouth 3 (three) times daily.    Dispense:  21 tablet    Refill:  0   L4 dermatome distribution noted.  Patient was advised that she could be contagious for giving chickenpox to a  child who had never been vaccinated or had the disease. However, using the Valtrex they should be crusted over in a week and she should no longer be contagious. This gives her a window of several days prior to the start of her vacation [employer] school responsibilities. Unfortunately the patient is very worried and concerned in spite of this that she will have these responsibilities for the children and be contagious. All efforts at reassurance were made without patient voicing understanding.  Follow-up: Return if symptoms worsen or fail to improve.  Claretta Fraise, M.D.

## 2017-01-17 ENCOUNTER — Telehealth: Payer: Self-pay | Admitting: Family Medicine

## 2017-01-17 ENCOUNTER — Other Ambulatory Visit: Payer: Self-pay | Admitting: Family Medicine

## 2017-01-17 MED ORDER — PREDNISONE 10 MG PO TABS: ORAL_TABLET | ORAL | 0 refills | Status: DC

## 2017-01-17 NOTE — Telephone Encounter (Signed)
Please contact the patient, it is typical to have continued blistering. The antibiotic won't change that, but I can send in a course of prednisone. (Done) WS

## 2017-01-17 NOTE — Telephone Encounter (Signed)
Pt has finished Valtrex RX Some continued blistering Please advise

## 2017-01-18 ENCOUNTER — Telehealth: Payer: Self-pay | Admitting: Genetics

## 2017-01-18 ENCOUNTER — Encounter: Payer: Self-pay | Admitting: Genetics

## 2017-01-18 ENCOUNTER — Telehealth: Payer: Self-pay | Admitting: Hematology and Oncology

## 2017-01-18 NOTE — Telephone Encounter (Signed)
Pt aware rx for prednisone has been sent to pharmacy

## 2017-01-18 NOTE — Telephone Encounter (Signed)
error 

## 2017-01-18 NOTE — Telephone Encounter (Signed)
Pt notified of script

## 2017-01-18 NOTE — Telephone Encounter (Signed)
Pt returned my call to schedule a genetic counseling appt. Appt has been scheduled for the pt to see Vicente Males on 7/10 at 10am. Pt aware to arrive 15 minutes early. Demographics verified. Letter mailed.

## 2017-02-20 DIAGNOSIS — G4733 Obstructive sleep apnea (adult) (pediatric): Secondary | ICD-10-CM | POA: Diagnosis not present

## 2017-02-21 ENCOUNTER — Other Ambulatory Visit: Payer: PPO

## 2017-02-21 ENCOUNTER — Encounter: Payer: Self-pay | Admitting: Genetics

## 2017-02-21 ENCOUNTER — Ambulatory Visit (HOSPITAL_BASED_OUTPATIENT_CLINIC_OR_DEPARTMENT_OTHER): Payer: PPO | Admitting: Genetics

## 2017-02-21 DIAGNOSIS — Z800 Family history of malignant neoplasm of digestive organs: Secondary | ICD-10-CM

## 2017-02-21 DIAGNOSIS — D126 Benign neoplasm of colon, unspecified: Secondary | ICD-10-CM | POA: Diagnosis not present

## 2017-02-21 DIAGNOSIS — Z315 Encounter for genetic counseling: Secondary | ICD-10-CM

## 2017-02-21 DIAGNOSIS — Z8371 Family history of colonic polyps: Secondary | ICD-10-CM

## 2017-02-21 DIAGNOSIS — Z8042 Family history of malignant neoplasm of prostate: Secondary | ICD-10-CM

## 2017-02-21 DIAGNOSIS — K635 Polyp of colon: Secondary | ICD-10-CM

## 2017-02-21 DIAGNOSIS — Z8051 Family history of malignant neoplasm of kidney: Secondary | ICD-10-CM

## 2017-02-21 DIAGNOSIS — Z808 Family history of malignant neoplasm of other organs or systems: Secondary | ICD-10-CM

## 2017-02-21 NOTE — Progress Notes (Signed)
REFERRING PROVIDER: Mauri Pole, MD [place], [place] [address]  PRIMARY PROVIDER:  Chevis Pretty, FNP  PRIMARY REASON FOR VISIT:  1. Polyposis of colon   2. Family history of colon cancer   3. Family history of prostate cancer   4. Family history of stomach cancer   5. Family history of kidney cancer   6. Family history of colonic polyps      HISTORY OF PRESENT ILLNESS:   Stacey Summers, a 71 y.o. female, was seen for a [facility] cancer genetics consultation at the request of Dr. Silverio Decamp due to a personal history of polyposis and family history of polyposis and cancer.  Ms. Foxworth presents to clinic today to discuss the possibility of a hereditary predisposition to cancer, genetic testing, and to further clarify her future cancer risks, as well as potential cancer risks for family members.     Ms. Peregoy is a 71 y.o. female with no personal history of cancer. In 2013, at the age of 52, SHARNETTE KITAMURA was diagnosed with over 30 colon polyps on her first colonoscopy.  Approximately 20 were biopsied. Colonoscopies in 2015 and 2018 revealed additional polyps. She plans to return for colonoscopy in 2019. In 2013, Ms. Mousseau underwent genetic counseling and testing for hereditary colon cancer/polyposis syndromes with [facility]. At that time, Ms. Dykes was negative for mutations in the genes that were tested. A copy of her previous genetic testing results are included below for reference.    Past Medical History:  Diagnosis Date  . Allergy    seasonal  . Anxiety   . Arthritis    hands, back   . Asthma    bronchitis  . Cataract    removed both eyes  . Chronic back pain    lower back   . COPD (chronic obstructive pulmonary disease) ([facility])   . Diabetes mellitus without complication ([facility])   . DJD (degenerative joint disease)   . Fibromyalgia   . GERD (gastroesophageal reflux disease)   . History of abnormal mammogram   .  Hyperlipidemia   . Hypertension   . Irregular heart beat   . Neuromuscular disorder ([facility])    fibromyalgia  . Sleep apnea    cpap some  . Tubular adenoma of colon 05/09/12   "innumerable number" of flat polyps    Past Surgical History:  Procedure Laterality Date  . CARPAL TUNNEL RELEASE Left 10/20/14   [facility]  . [place]  . CHOLECYSTECTOMY  1990  . COLONOSCOPY    . LARYNX SURGERY     POLYP REMOVAL  . POLYPECTOMY     vocal cord  . TONSILLECTOMY  1965  . TUMOR REMOVAL     benign fatty tumors on abdomen    Social History   Social History  . Marital status: Married    Spouse name: N/A  . Number of children: 3  . Years of education: N/A   Occupational History  . retired    Social History Main Topics  . Smoking status: Former Smoker    Packs/day: 1.00    Years: 3.00    Types: Cigarettes    Quit date: 08/15/1969  . Smokeless tobacco: Never Used  . Alcohol use No  . Drug use: No  . Sexual activity: Yes   Other Topics Concern  . Not on file   Social History Narrative  . No narrative on file     FAMILY HISTORY:  We obtained a  detailed, 4-generation family history.  Significant diagnoses are listed below: Family History  Problem Relation Age of Onset  . Prostate cancer Father        d.89 diagnosed in late-60s  . Colon cancer Father        diagnosed at 51  . Heart failure Mother        d.37  . Heart disease Mother   . Colon polyps Brother        polyposis. Underwent partial colectomy at age 71.  Marland Kitchen Spina bifida Brother        died at birth  . Stomach cancer Paternal Aunt   . Brain cancer Cousin 19       paternal first-cousin-once-removed  . Kidney cancer Cousin        d.78s maternal first-cousin  . Other Other 16       multiple lipomas  . Colon cancer Paternal Uncle        in his 50s  . Heart failure Maternal Grandmother        d.45  . Heart failure Maternal Uncle        d.50  . Colon polyps Son 56  . Esophageal cancer Neg Hx   .  Rectal cancer Neg Hx    Ms. Kirn has two sons, ages 36 and 56, and a daughter, age 22. Her oldest son recently had a colonoscopy that revealed 6 polyps. Her younger son has not had a colonoscopy, though he knows he should due to family history. Her daughter has a history of some polyps. Ms. Vasco brother, age 59, had a partial colectomy due to colon polyps.    Ms. Mazurkiewicz father had prostate cancer in his late 15s and colon cancer in his 46s.  He had four sisters and a brother.  One sister died of stomach cancer at an unknown age, and his brother has had rectal bleeding in his 47s but does not want a colonoscopy. Another sister died in her 58s with an unspecified form of cancer. Ms. Hotard's paternal grandmother died from heart problems. Her paternal grandfather died from an aneurysm.  Ms. Cuthbert mother died at 15 from heart problems. Ms. Gallen had three maternal aunts and one maternal uncle. Her uncle died at age 69 from heart problems. Her aunts died in their 42s and 40s without cancers. One of her uncle's sons died from kidney cancer in his late-70s. Ms. Brazil maternal grandmother died at 66 from heart problems. Ms. Tennant maternal grandfather died at 72 without cancers.  Patient's maternal ancestors are of unknown descent, and paternal ancestors are of unknown descent. There is no reported Ashkenazi Jewish ancestry. There is no  known consanguinity.  GENETIC COUNSELING ASSESSMENT: KESIA DALTO is a 71 y.o. female with a personal and family which is somewhat suggestive of a hereditary cancer/polyposis syndrome and predisposition to cancer. Though Ms. Rowlette was previously tested for hereditary polyposis and colon cancer syndromes and was negative at that time, there are now additional genes associated with hereditary polyposis and colon cancer that are available for testing. We, therefore, discussed and recommended the following at today's visit.    DISCUSSION: We reviewed the characteristics, features and inheritance patterns of hereditary polyposis/cancer syndromes. We also discussed genetic testing, including the appropriate family members to test, the process of testing, insurance coverage and turn-around-time for results. We discussed the implications of a negative, positive and/or variant of uncertain significant result. We recommended Ms. Platts pursue genetic testing for the Multi-Cancers Panel offered  by [facility]. [facility]'s Multi-Cancer Panel includes analysis of the following 83 genes: ALK, APC, ATM, AXIN2, BAP1, BARD1, BLM, BMPR1A, BRCA1, BRCA2, BRIP1, CASR, CDC73, CDH1, CDK4, CDKN1B, CDKN1C, CDKN2A, CEBPA, CHEK2, CTNNA1, DICER1, DIS3L2, EGFR, EPCAM, FH, FLCN, GATA2, GPC3, GREM1, HOXB13, HRAS, KIT, MAX, MEN1, MET, MITF, MLH1, MSH2, MSH3, MSH6, MUTYH, NBN, NF1, NF2, NTHL1, PALB2, PDGFRA, PHOX2B, PMS2, POLD1, POLE, POT1, PRKAR1A, PTCH1, PTEN, RAD50, RAD51C, RAD51D, RB1, RECQL4, RET, RUNX1, SDHA, SDHAF2, SDHB, SDHC, SDHD, SMAD4, SMARCA4, SMARCB1, SMARCE1, STK11, SUFU, TERC, TERT, TMEM127, TP53, TSC1, TSC2, VHL, WRN, WT1.  Based on Ms. Yandell's personal and family history of polyposis and cancer, she meets medical criteria for genetic testing. Despite that she meets criteria, she may still have an out of pocket cost. We discussed that if her out of pocket cost for testing is over $100, the laboratory will call and confirm whether she wants to proceed with testing.  If the out of pocket cost of testing is less than $100 she will be billed by the genetic testing laboratory. Because Ms. Jahnke expressed concern regarding cost, we requested that the lab contact her regarding ANY out-of-pocket expense.   PLAN: After considering the risks, benefits, and limitations, Ms. Tullo  provided informed consent to pursue genetic testing and the blood sample was sent to [facility] for analysis of the 83-gene Multi-Cancers Panel. Results  should be available within approximately 3 weeks' time, at which point they will be disclosed by telephone to Ms. Kinyon, as will any additional recommendations warranted by these results. This information will also be available in Epic.   Lastly, we encouraged Ms. Hank to remain in contact with cancer genetics annually so that we can continuously update the family history and inform her of any changes in cancer genetics and testing that may be of benefit for this family.   Ms.  Kittel questions were answered to her satisfaction today. Our contact information was provided should additional questions or concerns arise. Thank you for the referral and allowing [place] to share in the care of your patient.   Mal Misty, MS, [facility] Certified Naval [email or web address] phone: [phone]  The patient was seen for a total of 40 minutes in face-to-face genetic counseling.    _______________________________________________________________________ For Office Staff:  Number of people involved in session: 1 Was an Intern/ student involved with case: no

## 2017-03-03 ENCOUNTER — Other Ambulatory Visit: Payer: Self-pay | Admitting: Nurse Practitioner

## 2017-03-08 ENCOUNTER — Telehealth: Payer: Self-pay | Admitting: Genetics

## 2017-03-08 ENCOUNTER — Encounter: Payer: Self-pay | Admitting: Genetics

## 2017-03-08 ENCOUNTER — Ambulatory Visit: Payer: Self-pay | Admitting: Genetics

## 2017-03-08 DIAGNOSIS — Z1379 Encounter for other screening for genetic and chromosomal anomalies: Secondary | ICD-10-CM

## 2017-03-08 HISTORY — DX: Encounter for other screening for genetic and chromosomal anomalies: Z13.79

## 2017-03-08 NOTE — Telephone Encounter (Signed)
Reviewed that germline genetic testing revealed no pathogenic mutations. This is considered to be a negative result. Testing was performed through [facility]'s 83-gene Multi-Cancers Panel. [facility] Multi-Cancer Panel includes analysis of the following 83 genes: ALK, APC, ATM, AXIN2, BAP1, BARD1, BLM, BMPR1A, BRCA1, BRCA2, BRIP1, CASR, CDC73, CDH1, CDK4, CDKN1B, CDKN1C, CDKN2A, CEBPA, CHEK2, CTNNA1, DICER1, DIS3L2, EGFR, EPCAM, FH, FLCN, GATA2, GPC3, GREM1, HOXB13, HRAS, KIT, MAX, MEN1, MET, MITF, MLH1, MSH2, MSH3, MSH6, MUTYH, NBN, NF1, NF2, NTHL1, PALB2, PDGFRA, PHOX2B, PMS2, POLD1, POLE, POT1, PRKAR1A, PTCH1, PTEN, RAD50, RAD51C, RAD51D, RB1, RECQL4, RET, RUNX1, SDHA, SDHAF2, SDHB, SDHC, SDHD, SMAD4, SMARCA4, SMARCB1, SMARCE1, STK11, SUFU, TERC, TERT, TMEM127, TP53, TSC1, TSC2, VHL, WRN, WT1.  For more detailed discussion, please see genetic counseling documentation from 03/08/2017. Result report dated 02/27/2017.     

## 2017-03-08 NOTE — Progress Notes (Addendum)
HPI: Ms. Pfeifer was previously seen in the [facility] on 02/21/2017 due to a personal history polyposis and a family history of cancer and concerns regarding a hereditary predisposition to cancer. Please refer to our prior cancer genetics clinic note for more information regarding Ms. Vigilante's medical, social and family histories, and our assessment and recommendations, at the time. Ms. Ruffalo recent genetic test results were disclosed to her, as were recommendations warranted by these results. These results and recommendations are discussed in more detail below.  CANCER HISTORY: Ms. Nies is a 71 y.o. female with no personal history of cancer. In 2013, at the age of 84, AKEEMA BRODER was diagnosed with over 30 colon polyps on her first colonoscopy. Approximately 20 were biopsied. Colonoscopies in 2015 and 2018 revealed additional polyps. She plans to return for colonoscopy in 2019. In 2013, Ms. Buford tested negative for mutations in 18 genes associated with hereditary colon cancer through [facility]'s Colorectal Cancer Panel. Genes analyzed include: APC, ATM, AXIN2, BLM, BMPR1A, CDH1, CHEK2, EPCAM, MLH1, MSH2, MSH6, MUTYH, PMS2, PTEN, SMAD4, STK11, TP53, XRCC2. Ms. Happ pursued additional genetic testing for hereditary polyposis/colorectal cancer at her appointment with [place] on 02/21/2017. Results are discussed below.   FAMILY HISTORY:  We obtained a detailed, 4-generation family history.  Significant diagnoses are listed below: Family History  Problem Relation Age of Onset  . Prostate cancer Father        d.89 diagnosed in late-60s  . Colon cancer Father        diagnosed at 29  . Heart failure Mother        d.37  . Heart disease Mother   . Colon polyps Brother        polyposis. Underwent partial colectomy at age 57.  Marland Kitchen Spina bifida Brother        died at birth  . Stomach cancer Paternal Aunt   . Brain cancer Cousin 19       paternal  first-cousin-once-removed  . Kidney cancer Cousin        d.78s maternal first-cousin  . Other Other 16       multiple lipomas  . Colon cancer Paternal Uncle        in his 33s  . Heart failure Maternal Grandmother        d.45  . Heart failure Maternal Uncle        d.50  . Colon polyps Son 45  . Esophageal cancer Neg Hx   . Rectal cancer Neg Hx    Ms. Chojnowski has two sons, ages 72 and 105, and a daughter, age 30. Her oldest son recently had a colonoscopy that revealed 6 polyps. Her younger son has not had a colonoscopy, though he knows he should due to family history. Her daughter has a history of some polyps. Ms. Decaire brother, age 28, had a partial colectomy due to colon polyps.   Ms. Washer father had prostate cancer in his late 82s and colon cancer in his 25s. He had four sisters and a brother. One sister died of stomach cancer at an unknown age, and his brother has had rectal bleeding in his 39s but does not want a colonoscopy. Another sister died in her 31s with an unspecified form of cancer. Ms. Walski's paternal grandmother died from heart problems. Her paternal grandfather died from an aneurysm.  Ms. Kulkarni mother died at 85 from heart problems. Ms. Zarr had three maternal aunts and one maternal uncle. Her uncle died  at age 69 from heart problems. Her aunts died in their 18s and 64s without cancers. One of her uncle's sons died from kidney cancer in his late-70s. Ms. Laszlo maternal grandmother died at 45 from heart problems. Ms. Zavadil maternal grandfather died at 28 without cancers.  Patient's maternal ancestors are of unknown descent, and paternal ancestors are of unknown descent. There is no reported Ashkenazi Jewish ancestry. There is no known consanguinity.  GENETIC TEST RESULTS: Genetic testing performed through [facility]'s Multi-Cancers Panel reported out on 02/27/2017 showed no pathogenic mutations. [facility] Multi-Cancer Panel includes  analysis of the following 83 genes: ALK, APC, ATM, AXIN2, BAP1, BARD1, BLM, BMPR1A, BRCA1, BRCA2, BRIP1, CASR, CDC73, CDH1, CDK4, CDKN1B, CDKN1C, CDKN2A, CEBPA, CHEK2, CTNNA1, DICER1, DIS3L2, EGFR, EPCAM, FH, FLCN, GATA2, GPC3, GREM1, HOXB13, HRAS, KIT, MAX, MEN1, MET, MITF, MLH1, MSH2, MSH3, MSH6, MUTYH, NBN, NF1, NF2, NTHL1, PALB2, PDGFRA, PHOX2B, PMS2, POLD1, POLE, POT1, PRKAR1A, PTCH1, PTEN, RAD50, RAD51C, RAD51D, RB1, RECQL4, RET, RUNX1, SDHA, SDHAF2, SDHB, SDHC, SDHD, SMAD4, SMARCA4, SMARCB1, SMARCE1, STK11, SUFU, TERC, TERT, TMEM127, TP53, TSC1, TSC2, VHL, WRN, WT1.  The test report will be scanned into EPIC and will be located under the Molecular Pathology section of the Results Review tab.A portion of the result report is included below for reference.    We discussed with Ms. Pechacek that since the current genetic testing is not perfect, it is possible there may be a gene mutation in one of these genes that current testing cannot detect, but that chance is small. We also discussed, that it is possible that another gene that has not yet been discovered, or that we have not yet tested, is responsible for the cancer diagnoses in the family. Therefore, important to remain in touch with cancer genetics in the future so that we can continue to offer Ms. Dobies the most up to date genetic testing.     CANCER SCREENING RECOMMENDATIONS: This result indicates that it is unlikely Ms. Lenzo has an increased risk for a future cancer due to a mutation in one of these genes. However, her personal history of polyposis and family history of caners remain unexplained. Because no causative or actionable mutations were identified, it is recommended she continue to follow the cancer management and screening guidelines provided by her gastroenterologist and primary healthcare provider. Her colon cancer screening plan should be based on her personal history of polyps and family history of colon cancer (father,  paternal uncle).   RECOMMENDATIONS FOR FAMILY MEMBERS: Individuals in this family appear to be at increased risk for colon cancer and polyposis. Ms. Spells children should discuss their family history of polyposis and colon cancer with their physicians to establish a personalized colorectal cancer screening plan. NCCN guidelines for management of personal and family history of colonic adenomatous polyposis of unknown etiology are included at the end of this note for reference. These guidelines can be used for management of Ms. Nou and her close relatives. Family members should also follow age-based cancer screening recommendations for the general population unless otherwise advised by their physician.   Genetic testing appears to be indicated for other family members who have a personal history of polyposis, such as Ms. Regina's brother. Ms. Lantier will let us know if we can be of any assistance in coordinating genetic counseling and/or testing for this family member.   FOLLOW-UP: Lastly, we discussed with Ms. Cradle that cancer genetics is a rapidly advancing field and it is possible that new genetic tests will be appropriate for  her and/or her family members in the future. We encouraged her to remain in contact with cancer genetics on an annual basis so we can update her personal and family histories and let her know of advances in cancer genetics that may benefit this family.   Our contact number was provided. Ms. Mui questions were answered to her satisfaction, and she knows she is welcome to call us at anytime with additional questions or concerns.   Mal Misty, MS, [facility] Certified Naval [email or web address]

## 2017-03-31 ENCOUNTER — Telehealth: Payer: Self-pay | Admitting: *Deleted

## 2017-03-31 NOTE — Telephone Encounter (Signed)
Order for CPAP supplies signed by Dr. Claiborne Billings and faxed to [facility].

## 2017-04-27 ENCOUNTER — Other Ambulatory Visit: Payer: Self-pay | Admitting: Nurse Practitioner

## 2017-04-27 ENCOUNTER — Telehealth: Payer: Self-pay | Admitting: Nurse Practitioner

## 2017-04-27 NOTE — Telephone Encounter (Signed)
Left detailed message that pt will need to be seen to get med

## 2017-04-27 NOTE — Telephone Encounter (Signed)
Please review and advise.

## 2017-04-27 NOTE — Telephone Encounter (Signed)
Has to be to get [ain meds

## 2017-05-04 ENCOUNTER — Other Ambulatory Visit: Payer: Self-pay | Admitting: Nurse Practitioner

## 2017-05-04 ENCOUNTER — Ambulatory Visit (INDEPENDENT_AMBULATORY_CARE_PROVIDER_SITE_OTHER): Payer: PPO | Admitting: Nurse Practitioner

## 2017-05-04 ENCOUNTER — Encounter: Payer: Self-pay | Admitting: Nurse Practitioner

## 2017-05-04 VITALS — BP 127/74 | HR 70 | Temp 97.2°F | Ht 61.0 in | Wt 217.0 lb

## 2017-05-04 DIAGNOSIS — K219 Gastro-esophageal reflux disease without esophagitis: Secondary | ICD-10-CM | POA: Diagnosis not present

## 2017-05-04 DIAGNOSIS — Z1231 Encounter for screening mammogram for malignant neoplasm of breast: Secondary | ICD-10-CM

## 2017-05-04 DIAGNOSIS — F411 Generalized anxiety disorder: Secondary | ICD-10-CM

## 2017-05-04 DIAGNOSIS — R42 Dizziness and giddiness: Secondary | ICD-10-CM | POA: Diagnosis not present

## 2017-05-04 DIAGNOSIS — M8580 Other specified disorders of bone density and structure, unspecified site: Secondary | ICD-10-CM

## 2017-05-04 DIAGNOSIS — M797 Fibromyalgia: Secondary | ICD-10-CM | POA: Diagnosis not present

## 2017-05-04 DIAGNOSIS — E119 Type 2 diabetes mellitus without complications: Secondary | ICD-10-CM | POA: Diagnosis not present

## 2017-05-04 DIAGNOSIS — G8929 Other chronic pain: Secondary | ICD-10-CM | POA: Diagnosis not present

## 2017-05-04 DIAGNOSIS — E785 Hyperlipidemia, unspecified: Secondary | ICD-10-CM

## 2017-05-04 DIAGNOSIS — M5442 Lumbago with sciatica, left side: Secondary | ICD-10-CM

## 2017-05-04 DIAGNOSIS — I10 Essential (primary) hypertension: Secondary | ICD-10-CM

## 2017-05-04 DIAGNOSIS — J410 Simple chronic bronchitis: Secondary | ICD-10-CM | POA: Diagnosis not present

## 2017-05-04 LAB — CMP14+EGFR
A/G RATIO: 1.7 (ref 1.2–2.2)
ALT: 23 IU/L (ref 0–32)
AST: 37 IU/L (ref 0–40)
Albumin: 3.9 g/dL (ref 3.6–4.8)
Alkaline Phosphatase: 85 IU/L (ref 39–117)
BILIRUBIN TOTAL: 1.4 mg/dL — AB (ref 0.0–1.2)
BUN / CREAT RATIO: 30 — AB (ref 12–28)
BUN: 22 mg/dL (ref 8–27)
CHLORIDE: 107 mmol/L — AB (ref 96–106)
CO2: 25 mmol/L (ref 20–29)
Calcium: 9.3 mg/dL (ref 8.7–10.3)
Creatinine, Ser: 0.73 mg/dL (ref 0.57–1.00)
GFR calc non Af Amer: 84 mL/min/{1.73_m2} (ref >59)
GFR, EST AFRICAN AMERICAN: 97 mL/min/{1.73_m2} (ref >59)
GLOBULIN, TOTAL: 2.3 g/dL (ref 1.5–4.5)
Glucose: 126 mg/dL — ABNORMAL HIGH (ref 65–99)
POTASSIUM: 4.7 mmol/L (ref 3.5–5.2)
SODIUM: 147 mmol/L — AB (ref 134–144)
TOTAL PROTEIN: 6.2 g/dL (ref 6.0–8.5)

## 2017-05-04 LAB — LIPID PANEL
Chol/HDL Ratio: 2.8 ratio (ref 0.0–4.4)
Cholesterol, Total: 115 mg/dL (ref 100–199)
HDL: 41 mg/dL (ref >39)
LDL Calculated: 56 mg/dL (ref 0–99)
Triglycerides: 91 mg/dL (ref 0–149)
VLDL Cholesterol Cal: 18 mg/dL (ref 5–40)

## 2017-05-04 LAB — BAYER DCA HB A1C WAIVED: HB A1C (BAYER DCA - WAIVED): 6.4 % (ref <7.0)

## 2017-05-04 MED ORDER — METHYLPREDNISOLONE ACETATE 80 MG/ML IJ SUSP
80.0000 mg | Freq: Once | INTRAMUSCULAR | Status: AC
  Administered 2017-05-04: 80 mg via INTRAMUSCULAR

## 2017-05-04 MED ORDER — TRAMADOL HCL 50 MG PO TABS: 50.0000 mg | ORAL_TABLET | Freq: Every day | ORAL | 0 refills | Status: DC | PRN

## 2017-05-04 MED ORDER — MECLIZINE HCL 25 MG PO TABS: 25.0000 mg | ORAL_TABLET | Freq: Three times a day (TID) | ORAL | 0 refills | Status: DC | PRN

## 2017-05-04 NOTE — Progress Notes (Signed)
Subjective:    Patient ID: Stacey Summers, female    DOB: [birth year], 71 y.o.   MRN: [id number]  Stacey Summers is here today for follow up of chronic medical problem.  Outpatient Encounter Prescriptions as of 05/04/2017  Medication Sig  . ALPRAZolam (XANAX) 0.25 MG tablet Take 1 tablet (0.25 mg total) by mouth 2 (two) times daily as needed.  Marland Kitchen amLODipine (NORVASC) 5 MG tablet Take 1 tablet (5 mg total) by mouth daily.  Marland Kitchen aspirin 81 MG tablet Take 81 mg by mouth as needed for pain.  Marland Kitchen atorvastatin (LIPITOR) 40 MG tablet Take 1 tablet (40 mg total) by mouth daily.  . benazepril (LOTENSIN) 40 MG tablet Take 1 tablet (40 mg total) by mouth daily.  . Blood Glucose Monitoring Suppl (LIBERTY BLOOD GLUCOSE MONITOR) DEVI 1 each by Does not apply route daily. Test 1x per day and prn-dx250.02  . Calcium-Vitamin D-Vitamin K (CALCIUM SOFT CHEWS) 500-100-40 MG-UNT-MCG CHEW Chew 1 each by mouth daily.  . cholecalciferol (VITAMIN D) 1000 UNITS tablet Take 1,000 Units by mouth daily.   . cyclobenzaprine (FLEXERIL) 5 MG tablet TAKE 1 BY MOUTH 3 TIMES DAILY AS NEEDED FOR MUSCLE SPASMS  --Ronnald Collum NP  . Fluticasone-Salmeterol (ADVAIR) 100-50 MCG/DOSE AEPB Inhale 1 puff into the lungs 2 (two) times daily.  . furosemide (LASIX) 40 MG tablet TAKE 1 BY MOUTH EVERY MORNING AND 1/2 BY MOUTH AT 1 IN THE AFTERNOON  . glucose blood test strip Test 1x per day and prn- dx 250.02  . guaiFENesin-codeine (ROBITUSSIN AC) 100-10 MG/5ML syrup Take 5-10 mLs by mouth 4 (four) times daily as needed for cough.  . metFORMIN (GLUCOPHAGE) 500 MG tablet Take 1 tablet (500 mg total) by mouth 2 (two) times daily with a meal.  . metolazone (ZAROXOLYN) 2.5 MG tablet 1 po 2x a week as needed  . omeprazole (PRILOSEC) 40 MG capsule TAKE 1 BY MOUTH DAILY --[name]  . ONE TOUCH ULTRA TEST test strip USE ONE STRIP TO CHECK GLUCOSE ONCE DAILY  . ONETOUCH DELICA LANCETS 40J MISC 1 each by Does not apply route daily.  Test 1X per day and prn-- dx 250.02  . predniSONE (DELTASONE) 10 MG tablet Take 5 daily for 2 days followed by 4,3,2 and 1 for 2 days each.  . traMADol (ULTRAM) 50 MG tablet Take 1 tablet (50 mg total) by mouth daily as needed.   Facility-Administered Encounter Medications as of 05/04/2017  Medication  . 0.9 %  sodium chloride infusion    1. Essential hypertension  Blood pressure controlled with amlodipine and benazepril.  Patient does check blood pressure at home and it is typically 120s/70s.  2. Simple chronic bronchitis ([place])  Patient using Advair to improve breathing symptoms.  3. Gastroesophageal reflux disease without esophagitis Symptoms managed with omeprazole daily.  No complaints today.   4. Type 2 diabetes mellitus without complication, without long-term current use of insulin ([place])  Managed with metformin.  Patient checks blood glucose at home every morning and it is typically ranging from 120s-over 200.  Last A1C was 7.8% on 10/12/16.  5. Osteopenia, unspecified location  Patient taking a calcium supplement to support bone health.  6. GAD (generalized anxiety disorder)  Xanax as needed for anxiety.  7. Morbid obesity ([place])  No significant weight gain or loss.  8. Hyperlipidemia, unspecified hyperlipidemia type  Patient taking atorvastatin.  No c/o joint pain r/t statin.  LFTs monitored regularly.  9. Fibromyalgia muscle pain  Taking tramadol as needed for muscle pain.    New complaints: Patient still having back pain and is out of her tramadol.  She is having pain radiating down bilaterally low exremities.  She states she has been having dizziness for 5 days since standing quickly and causing the room to spin.  She is having some associated headache and ears "popping'.  Patient states she got her influenza vaccine and shingles vaccines on 04/29/17.  Social history: Son had heart attack 3 weeks ago and she is real worried about him   Review of Systems  Constitutional:  Positive for activity change (since dizziness 5 days ago) and fatigue. Negative for appetite change.  HENT: Negative for ear discharge, ear pain and tinnitus.        "popping" of ears bilaterally   Respiratory: Positive for shortness of breath (with exertion). Negative for cough.   Cardiovascular: Negative for chest pain and palpitations.  Neurological: Positive for dizziness (x 5 days) and headaches (x 5 days). Negative for syncope.  All other systems reviewed and are negative.      Objective:   Physical Exam  Constitutional: She is oriented to person, place, and time. She appears well-developed and well-nourished. No distress.  HENT:  Head: Normocephalic.  Mouth/Throat: Oropharynx is clear and moist.  Eyes: Pupils are equal, round, and reactive to light.  Neck: Normal range of motion. Neck supple. No thyromegaly present.  Cardiovascular: Normal rate, regular rhythm and intact distal pulses.   Murmur heard. Pulmonary/Chest: Effort normal and breath sounds normal. No respiratory distress. She has no wheezes.  Abdominal: Soft. Bowel sounds are normal. She exhibits no distension. There is tenderness. Guarding: generalized tenderness on palpation.  Musculoskeletal: Normal range of motion. She exhibits edema (nonpitting in bilateral lower extremities).  Lymphadenopathy:    She has no cervical adenopathy.  Neurological: She is alert and oriented to person, place, and time.  Skin: Skin is warm and dry.  Psychiatric: She has a normal mood and affect. Her behavior is normal.   BP 127/74   Pulse 70   Temp (!) 97.2 F (36.2 C) (Oral)   Ht 5' 1"  (1.549 m)   Wt 217 lb (98.4 kg)   BMI 41.00 kg/m  A1C: 6.4%     Assessment & Plan:  1. Essential hypertension Low sodium diet - CMP14+EGFR  2. Simple chronic bronchitis (HCC) Avoid ciagrette smoke  3. Gastroesophageal reflux disease without esophagitis Avoid spicy foods Do not eat 2 hours prior to bedtime  4. Type 2 diabetes mellitus  without complication, without long-term current use of insulin (HCC) Continue to watch carbs in diet Blood sugar may go up from depo shot - Bayer DCA Hb A1c Waived - Microalbumin / creatinine urine ratio  5. Osteopenia, unspecified location Weight bearing exercises as can tolerate  6. GAD (generalized anxiety disorder) Stress managemnet  7. Morbid obesity ([place]) Discussed diet and exercise for person with BMI >25 Will recheck weight in 3-6 months  8. Hyperlipidemia, unspecified hyperlipidemia type Low aft diet - Lipid panel  9. Fibromyalgia muscle pain  10. Chronic left-sided low back pain with left-sided sciatica - traMADol (ULTRAM) 50 MG tablet; Take 1 tablet (50 mg total) by mouth daily as needed.  Dispense: 30 tablet; Refill: 0 - methylPREDNISolone acetate (DEPO-MEDROL) injection 80 mg; Inject 1 mL (80 mg total) into the muscle once.  11. Vertigo Force fluids - meclizine (ANTIVERT) 25 MG tablet; Take 1 tablet (25 mg total) by mouth 3 (three) times daily as  needed for dizziness.  Dispense: 30 tablet; Refill: 0    Labs pending Health maintenance reviewed Diet and exercise encouraged Continue all meds Follow up  In 3 months   [place], FNP

## 2017-05-04 NOTE — Patient Instructions (Signed)
Labyrinthitis Labyrinthitis is an infection of the inner ear. Your inner ear is a system of tubes and canals (labyrinth). These are filled with fluid. Nerve cells in your inner ear send signals for hearing and balance to your brain. When tiny germs get inside the tubes and canals, they harm the cells that send messages to the brain. This can cause changes in hearing and balance. Follow these instructions at home:  Take medicines only as told by your doctor.  If you were prescribed an antibiotic medicine, finish all of it even if you start to feel better.  Rest as much as possible.  Avoid loud noises and bright lights.  Do not make sudden movements until any dizziness goes away.  Do not drive until your doctor says that you can.  Drink enough fluid to keep your pee (urine) clear or pale yellow.  Work with a physical therapist if you still feel dizzy after several weeks. A therapist can teach you exercises to help you deal with your dizziness.  Keep all follow-up visits as told by your doctor. This is important. Contact a doctor if:  Your symptoms do not get better with medicines.  You do not get better after two weeks.  You have a fever. Get help right away if:  You are very dizzy.  You keep throwing up (vomiting) or keep feeling sick to your stomach (nauseous).  Your hearing gets a lot worse very quickly. This information is not intended to replace advice given to you by your health care provider. Make sure you discuss any questions you have with your health care provider. Document Released: 08/01/2005 Document Revised: 01/07/2016 Document Reviewed: 05/13/2014 Elsevier Interactive Patient Education  [employer].

## 2017-05-13 ENCOUNTER — Other Ambulatory Visit: Payer: Self-pay | Admitting: Nurse Practitioner

## 2017-05-13 DIAGNOSIS — E119 Type 2 diabetes mellitus without complications: Secondary | ICD-10-CM

## 2017-05-16 ENCOUNTER — Telehealth: Payer: Self-pay | Admitting: *Deleted

## 2017-05-16 NOTE — Telephone Encounter (Signed)
CPAP supply order signed by Dr. Claiborne Billings and returned to [facility].

## 2017-05-17 ENCOUNTER — Other Ambulatory Visit: Payer: Self-pay | Admitting: Nurse Practitioner

## 2017-05-17 DIAGNOSIS — I10 Essential (primary) hypertension: Secondary | ICD-10-CM

## 2017-05-24 ENCOUNTER — Ambulatory Visit (HOSPITAL_COMMUNITY)
Admission: RE | Admit: 2017-05-24 | Discharge: 2017-05-24 | Disposition: A | Discharge status: Home / Self Care | Payer: PPO | Source: Ambulatory Visit | Attending: Nurse Practitioner | Admitting: Nurse Practitioner

## 2017-05-24 DIAGNOSIS — Z1231 Encounter for screening mammogram for malignant neoplasm of breast: Secondary | ICD-10-CM | POA: Diagnosis not present

## 2017-06-26 ENCOUNTER — Encounter: Payer: Self-pay | Admitting: *Deleted

## 2017-06-26 ENCOUNTER — Ambulatory Visit (INDEPENDENT_AMBULATORY_CARE_PROVIDER_SITE_OTHER): Payer: PPO | Admitting: *Deleted

## 2017-06-26 ENCOUNTER — Ambulatory Visit (INDEPENDENT_AMBULATORY_CARE_PROVIDER_SITE_OTHER): Payer: PPO

## 2017-06-26 VITALS — BP 152/92 | HR 70 | Ht 61.0 in | Wt 229.0 lb

## 2017-06-26 DIAGNOSIS — Z780 Asymptomatic menopausal state: Secondary | ICD-10-CM | POA: Diagnosis not present

## 2017-06-26 DIAGNOSIS — M8589 Other specified disorders of bone density and structure, multiple sites: Secondary | ICD-10-CM | POA: Diagnosis not present

## 2017-06-26 DIAGNOSIS — Z0000 Encounter for general adult medical examination without abnormal findings: Secondary | ICD-10-CM

## 2017-06-26 NOTE — Progress Notes (Signed)
Subjective:   Stacey Summers is a 71 y.o. female who presents for a subsequent Medicare Annual Wellness Visit. Stacey Summers has 3 adult children and lives at home with her husband. She is retired and is very active in her church. She is accompanied today by her husband who is also here for a wellness visit.   Review of Systems    Health is about the same as last year.   Cardiac Risk Factors include: advanced age (>57mn, >>23women);dyslipidemia;hypertension;sedentary lifestyle;obesity (BMI >30kg/m2);family history of premature cardiovascular disease  Other systems negative.     Objective:  BP (!) 152/92 (BP Location: Left Wrist, Patient Position: Sitting, Cuff Size: Small)   Pulse 70   Ht 5' 1"  (1.549 m)   Wt 229 lb (103.9 kg)   BMI 43.27 kg/m   Current Medications (verified) Outpatient Encounter Medications as of 06/26/2017  Medication Sig  . ALPRAZolam (XANAX) 0.25 MG tablet Take 1 tablet (0.25 mg total) by mouth 2 (two) times daily as needed.  .Marland KitchenamLODipine (NORVASC) 5 MG tablet Take 1 tablet (5 mg total) by mouth daily.  .Marland Kitchenaspirin 81 MG tablet Take 81 mg by mouth as needed for pain.  .Marland Kitchenatorvastatin (LIPITOR) 40 MG tablet Take 1 tablet (40 mg total) by mouth daily.  . benazepril (LOTENSIN) 40 MG tablet TAKE 1 TABLET BY MOUTH ONCE DAILY  . Blood Glucose Monitoring Suppl (LIBERTY BLOOD GLUCOSE MONITOR) DEVI 1 each by Does not apply route daily. Test 1x per day and prn-dx250.02  . Calcium-Vitamin D-Vitamin K (CALCIUM SOFT CHEWS) 500-100-40 MG-UNT-MCG CHEW Chew 1 each by mouth daily.  . cholecalciferol (VITAMIN D) 1000 UNITS tablet Take 1,000 Units by mouth daily.   . cyclobenzaprine (FLEXERIL) 5 MG tablet TAKE 1 BY MOUTH 3 TIMES DAILY AS NEEDED FOR MUSCLE SPASMS  --MRonnald CollumNP  . Fluticasone-Salmeterol (ADVAIR) 100-50 MCG/DOSE AEPB Inhale 1 puff into the lungs 2 (two) times daily.  . furosemide (LASIX) 40 MG tablet TAKE 1 BY MOUTH EVERY MORNING AND 1/2 BY MOUTH AT 1 IN THE  AFTERNOON  . glucose blood test strip Test 1x per day and prn- dx 250.02  . meclizine (ANTIVERT) 25 MG tablet Take 1 tablet (25 mg total) by mouth 3 (three) times daily as needed for dizziness.  . metFORMIN (GLUCOPHAGE) 500 MG tablet TAKE 1 TABLET BY MOUTH TWICE DAILY WITH MEALS  . metolazone (ZAROXOLYN) 2.5 MG tablet 1 po 2x a week as needed  . omeprazole (PRILOSEC) 40 MG capsule TAKE 1 BY MOUTH DAILY --[name]  . ONE TOUCH ULTRA TEST test strip USE ONE STRIP TO CHECK GLUCOSE ONCE DAILY  . ONETOUCH DELICA LANCETS 316LMISC 1 each by Does not apply route daily. Test 1X per day and prn-- dx 250.02  . traMADol (ULTRAM) 50 MG tablet Take 1 tablet (50 mg total) by mouth daily as needed.   Facility-Administered Encounter Medications as of 06/26/2017  Medication  . 0.9 %  sodium chloride infusion    Allergies (verified) Patient has no known allergies.   History: Past Medical History:  Diagnosis Date  . Allergy    seasonal  . Anxiety   . Arthritis    hands, back   . Asthma    bronchitis  . Cataract    removed both eyes  . Chronic back pain    lower back   . COPD (chronic obstructive pulmonary disease) ([place]   . Diabetes mellitus without complication ([place]   . DJD (degenerative joint  disease)   . Fibromyalgia   . Genetic testing 03/08/2017   Ms. Heslin underwent genetic counseling and testing for hereditary cancer syndromes on 02/21/2017. Her results were negative for mutations in all 83 genes analyzed by [facility]'s 83-gene Multi-Cancers Panel. Genes analyzed include: ALK, APC, ATM, AXIN2, BAP1, BARD1, BLM, BMPR1A, BRCA1, BRCA2, BRIP1, CASR, CDC73, CDH1, CDK4, CDKN1B, CDKN1C, CDKN2A, CEBPA, CHEK2, CTNNA1, DICER1, DIS3L2, EGFR, EPCAM  . GERD (gastroesophageal reflux disease)   . History of abnormal mammogram   . Hyperlipidemia   . Hypertension   . Irregular heart beat   . Neuromuscular disorder (HCC)    fibromyalgia  . Sleep apnea    cpap some  . Tubular adenoma of  colon 05/09/12   "innumerable number" of flat polyps   Past Surgical History:  Procedure Laterality Date  . CARPAL TUNNEL RELEASE Left 10/20/14   [facility]  . [place]  . CHOLECYSTECTOMY  1990  . COLONOSCOPY    . LARYNX SURGERY     POLYP REMOVAL  . POLYPECTOMY     vocal cord  . TONSILLECTOMY  1965  . TUMOR REMOVAL     benign fatty tumors on abdomen   Family History  Problem Relation Age of Onset  . Prostate cancer Father        d.89 diagnosed in late-60s  . Colon cancer Father        diagnosed at 45  . Heart failure Mother        d.37  . Heart disease Mother   . Colon polyps Brother        polyposis. Underwent partial colectomy at age 53.  Marland Kitchen Spina bifida Brother        died at birth  . Stomach cancer Paternal Aunt   . Brain cancer Cousin 19       paternal first-cousin-once-removed  . Kidney cancer Cousin        d.78s maternal first-cousin  . Other Other 16       multiple lipomas  . Colon cancer Paternal Uncle        in his 54s  . Heart failure Maternal Grandmother        d.45  . Heart failure Maternal Uncle        d.50  . Colon polyps Son 64  . Heart attack Son 42  . Esophageal cancer Neg Hx   . Rectal cancer Neg Hx    Social History   Occupational History  . Occupation: retired  Tobacco Use  . Smoking status: Former Smoker    Packs/day: 1.00    Years: 3.00    Pack years: 3.00    Types: Cigarettes    Last attempt to quit: 08/15/1969    Years since quitting: 47.8  . Smokeless tobacco: Never Used  Substance and Sexual Activity  . Alcohol use: No    Alcohol/week: 0.0 oz  . Drug use: No  . Sexual activity: Yes    Tobacco Counseling No tobacco use  Activities of Daily Living In your present state of health, do you have any difficulty performing the following activities: 06/26/2017  Hearing? Y  Comment Has some difficulty with hearing when people talk low  Vision? N  Difficulty concentrating or making decisions? N  Walking or climbing  stairs? N  Dressing or bathing? N  Doing errands, shopping? N  Preparing Food and eating ? N  Using the Toilet? N  In the past six months, have you accidently leaked urine? N  Do you have problems with loss of bowel control? N  Managing your Medications? N  Comment uses a pill box  Managing your Finances? N  Housekeeping or managing your Housekeeping? N  Some recent data might be hidden    Immunizations and Health Maintenance Immunization History  Administered Date(s) Administered  . Influenza,inj,Quad PF,6+ Mos 05/28/2014, 06/16/2015, 06/10/2016  . Influenza-Unspecified 04/24/2017  . Pneumococcal Conjugate-13 07/21/2014  . Pneumococcal Polysaccharide-23 09/21/2015  . Zoster Recombinat (Shingrix) 04/24/2017   Health Maintenance Due  Topic Date Due  . FOOT EXAM  06/10/2017  . OPHTHALMOLOGY EXAM  06/13/2017    Patient Care Team: Chevis Pretty, FNP as PCP - General (Family Medicine) Noralee Space, MD as Consulting Physician (Pulmonary Disease) Mauri Pole, MD as Consulting Physician (Gastroenterology) Harlen Labs, MD as Referring Physician (Optometry)  No hospitalizations, ER visits, or surgeries this past year.      Assessment:   This is a routine wellness examination for Sheanna.   Hearing/Vision screen Some hearing deficit noted during visit. Eye exam is due. Planning on going in January.   Dietary issues and exercise activities discussed: Current Exercise Habits: The patient does not participate in regular exercise at present, Exercise limited by: orthopedic condition(s)(back pain)  Goals Chair exercises daily. Handout given and reviewed.   Depression Screen PHQ 2/9 Scores 05/04/2017 01/10/2017 01/06/2017 11/30/2016 10/12/2016 06/10/2016 09/21/2015  PHQ - 2 Score 0 0 0 0 0 0 0  PHQ- 9 Score - - - - - - -    Fall Risk Fall Risk  05/04/2017 01/10/2017 01/06/2017 11/30/2016 10/12/2016  Falls in the past year? No No No No No  Number falls in past yr: - -  - - -  Injury with Fall? - - - - -  Risk for fall due to : - - - - -    Cognitive Function: MMSE - Mini Mental State Exam 06/26/2017  Orientation to time 5  Orientation to Place 5  Registration 3  Attention/ Calculation 5  Recall 2  Language- name 2 objects 2  Language- repeat 1  Language- follow 3 step command 3  Language- read & follow direction 1  Write a sentence 1  Copy design 1  Total score 29    Normal exam    Screening Tests Health Maintenance  Topic Date Due  . FOOT EXAM  06/10/2017  . OPHTHALMOLOGY EXAM  06/13/2017  . HEMOGLOBIN A1C  11/01/2017  . COLON CANCER SCREENING ANNUAL FOBT  12/06/2017  . COLONOSCOPY  12/06/2017  . MAMMOGRAM  05/24/2018  . PAP SMEAR  06/10/2018  . TETANUS/TDAP  06/16/2019  . INFLUENZA VACCINE  Completed  . DEXA SCAN  Completed  . Hepatitis C Screening  Completed  . PNA vac Low Risk Adult  Completed      Plan:  Chair exercises daily Keep f/u with PCP Schedule eye exam. Have report sent to our office.  2nd Shingrix due. Will have to get that at [facility].   I have personally reviewed and noted the following in the patient's chart:   . Medical and social history . Use of alcohol, tobacco or illicit drugs  . Current medications and supplements . Functional ability and status . Nutritional status . Physical activity . Advanced directives . List of other physicians . Hospitalizations, surgeries, and ER visits in previous 12 months . Vitals . Screenings to include cognitive, depression, and falls . Referrals and appointments  In addition, I have reviewed and discussed with  patient certain preventive protocols, quality metrics, and best practice recommendations. A written personalized care plan for preventive services as well as general preventive health recommendations were provided to patient.     Chong Sicilian, RN  06/26/2017   I have reviewed and agree with the above AWV documentation.   Mary-Margaret Hassell Done,  FNP

## 2017-06-26 NOTE — Patient Instructions (Signed)
  Stacey Summers , Thank you for taking time to come for your Medicare Wellness Visit. I appreciate your ongoing commitment to your health goals. Please review the following plan we discussed and let me know if I can assist you in the future.   These are the goals we discussed: Chair exercises daily. Refer to handout.   This is a list of the screening recommended for you and due dates:  Health Maintenance  Topic Date Due  . Complete foot exam   06/10/2017  . Eye exam for diabetics  06/13/2017  . Hemoglobin A1C  11/01/2017  . Stool Blood Test  12/06/2017  . Colon Cancer Screening  12/06/2017  . Mammogram  05/24/2018  . Pap Smear  06/10/2018  . Tetanus Vaccine  06/16/2019  . Flu Shot  Completed  . DEXA scan (bone density measurement)  Completed  .  Hepatitis C: One time screening is recommended by Center for Disease Control  (CDC) for  adults born from [birth year] through 1963.   Completed  . Pneumonia vaccines  Completed

## 2017-06-28 ENCOUNTER — Other Ambulatory Visit: Payer: Self-pay | Admitting: Nurse Practitioner

## 2017-06-28 DIAGNOSIS — K219 Gastro-esophageal reflux disease without esophagitis: Secondary | ICD-10-CM

## 2017-06-28 DIAGNOSIS — E782 Mixed hyperlipidemia: Secondary | ICD-10-CM

## 2017-06-29 DIAGNOSIS — H40003 Preglaucoma, unspecified, bilateral: Secondary | ICD-10-CM | POA: Diagnosis not present

## 2017-08-24 ENCOUNTER — Ambulatory Visit: Payer: PPO | Admitting: Pulmonary Disease

## 2017-08-28 ENCOUNTER — Other Ambulatory Visit: Payer: Self-pay | Admitting: Nurse Practitioner

## 2017-08-28 DIAGNOSIS — E119 Type 2 diabetes mellitus without complications: Secondary | ICD-10-CM

## 2017-08-28 DIAGNOSIS — F411 Generalized anxiety disorder: Secondary | ICD-10-CM

## 2017-08-29 NOTE — Telephone Encounter (Signed)
Last seen 05/04/17 MMM

## 2017-08-29 NOTE — Telephone Encounter (Signed)
Last refill without being seen 

## 2017-09-01 ENCOUNTER — Ambulatory Visit: Payer: PPO | Admitting: Pulmonary Disease

## 2017-09-01 ENCOUNTER — Encounter: Payer: Self-pay | Admitting: Pulmonary Disease

## 2017-09-01 VITALS — BP 118/72 | HR 59 | Temp 97.1°F | Ht 61.0 in | Wt 229.6 lb

## 2017-09-01 DIAGNOSIS — J984 Other disorders of lung: Secondary | ICD-10-CM | POA: Diagnosis not present

## 2017-09-01 DIAGNOSIS — E669 Obesity, unspecified: Secondary | ICD-10-CM

## 2017-09-01 DIAGNOSIS — R0609 Other forms of dyspnea: Secondary | ICD-10-CM | POA: Diagnosis not present

## 2017-09-01 DIAGNOSIS — F411 Generalized anxiety disorder: Secondary | ICD-10-CM

## 2017-09-01 DIAGNOSIS — R5381 Other malaise: Secondary | ICD-10-CM | POA: Diagnosis not present

## 2017-09-01 MED ORDER — UMECLIDINIUM-VILANTEROL 62.5-25 MCG/INH IN AEPB: 1.0000 | INHALATION_SPRAY | Freq: Every day | RESPIRATORY_TRACT | 0 refills | Status: DC

## 2017-09-01 MED ORDER — UMECLIDINIUM-VILANTEROL 62.5-25 MCG/INH IN AEPB: 1.0000 | INHALATION_SPRAY | Freq: Every day | RESPIRATORY_TRACT | 2 refills | Status: DC

## 2017-09-01 NOTE — Progress Notes (Signed)
Subjective:     Patient ID: Stacey Summers, female   DOB: 01/04/1946, 72 y.o.   MRN: [id number]  HPI      Her PCP is Dr. Morrie Sheldon in [place], [place]...   ~  August 04, 2015:  Initial pulmonary consult w/ SN>        33 y/o WF, referred by DrCroitoru for a pulmonary ebvaluation due to dyspnea;  She is a difficult historian-  c/o SOB/ dyspnea w/ min activity and ADLs x yrs she says (eg- housework, "I do what I have to do", no change x yrs, "I push myself");  She also notes a mild cough- on & off, "I had polyps in my throat removed yrs ago", min sputum, no hemoptysis, ?chest discomfort- "I feel funny in my chest";  She went to [employer] in Nov w/ incr congestion after that due to the smoke she thinks;  She has not tried any OTC meds for her cough or SOB...       She is an ex-smoker having smoked for only 59yr betw 89& 72y/o all <1ppd and quit ~1970;  She notes that she was told by DrMoore that she had asthma in the past, and treated for occas bronchitic infections, placed on ADVAIR100-2spBid;  She denies hx pneumonia, Tb or known exposures;  She worked for [employer] & was at 6 diff plants but no raw cotton dust exposure and no known asbestos exposure;  She does note some choking episodes esp while eating, she's had a prev EGD 2/14 by DrPatterson- +esophagitis & stricture dilated, mild antritis, neg HPylori, rec to continue PPI & she is on Protonix40/d...       She has been evaluated for OSA by DrKelly> She c/o snoring, non-restorative sleep, difficulty falluing asleep & maintaining sleep; PSG 04/16/13 showed AHI=38.7/hr (all hypopneas) & 52/hr during REM, RDI= 45/hr & 57/hr during REM, there was O2 desat to 75%, no signif PLMS;  She subseq had a CPAP titration but I could not locate this report- [facility];  Download in [id number] 25/30 days, usually 6+hrs per day, good control w/ AHI<1... There are no recent downloads in Epic, she indicates that she stopped using  the bipap on her own over the last yr, DrC has encouraged her to use it nightly...       She is followed for CARDS by DrCroitoru (notes reviewed)> HBP, mult coronary risk factors, heart murmur, palpitations, +FamHx of sudden death, HL, Obesity, Fatigue, VI/ edema      She is followed by her PCP, DrMoore>  HBP, DM, HL, Obesity, edema, fatigue (she thinks it's fibromyalgia), back pain, venous insuffic w/ edema EXAM shows Afeb, VSS , O2sat=98% on RA; she is 230#, 5'2"Tall, BMI=42; HEENT- neg, mallampati2;  Chest- clear w/o w/r/r;  Heart- RR gr1-2/6 SEM w/o r/g;  Abd- obese soft neg;  Ext- VI, tr edema w/o c/c; Neuro- intact...   CXR 08/04/15 showed norm heart size, clear lungs, NAD...  Spirometry 08/04/15 showed FVC=1.87 (69%), FEV1=1.51 (72%), %1sec=81%, mid-flows wnl at 88% predicted... C/w mild-mod restriction...  Ambulatory oximetry> O2sat=98% on RA at rest w/ pulse=76/min; she walked 3laps in office today w/ lowest O2sat=96% w/ pulse=92/min... IMP/PLAN>>  Nazirah's dyspnea appears to be multifactorial w/ components from pulm restriction due to obesity, deconditioning, OSA (not using CPAP regularly), cardiac factors;  Her cough is likely related to her reflux/ GERD/ choking episodes & she needs a vigorous antireflux regimen, plus  her PPI, and trial of Tramadol to suppress the cough (may need to STOP her ACE rx later);  Overall improvement in her dyspnea is going to require her to get on a diet, increase her exercise program, and get her weight down but she does not appear very motivated;  She also needs to get back on her BiPAP for her OSA- I suggested to her to f/u w/ Quentin Ore who did her initial sleep eval... ROV in 2-68moto check her progress.  ~  November 18, 2015:  3-438moOV & pulmonary follow up w/ SN>      DoJahannaeports that her symptoms are about the same over the last few months;  We felt that her DYSPNEA was multifactorial (as above) & we encouraged diet, exercise & wt reduction;  Wt is down 5# to  225# today & BMI=41- we reviewed diet recommendations;  She has not joined [employer] is not really exercising "I can't due to my fibromyalgia" and she notes that she no longer sings in church due to "I don't have the breath";  For her cough she has been using Tramadol50 prn & a more vigorous antireflux regimen- this seems to help;  She has OSA, not using her BiPAP, and she has a follow up appt w/ DrKelly soon...  We reviewed the following medical problems during today's office visit >>     Dyspnea> multifactorial w/ components from pulm restriction due to obesity, deconditioning/sedentary, OSA w/ BiPAP noncompliance, cardiac factors;  Her pulm eval is benign other than some expected restriction from her obesity; ?if she's had any improvement on the Advair100?  In my opinion she will need to put in the hard work of DIET/ EXERCISE/ WT REDUCTION to see any improvement in her breathing...    Cough> this is most likely related to her GERD, reflux, choking episodes and she may need GI f/u; we reviewed antireflux regimen- Prilosec40 taken 303mbefore the eve meal, NPO after dinner, elev HOB 6" blocks; continue Tramadol50 prn.    Hx OSA on BiPAP per DrKelly> she has not been using the BiPAP & it appears as though she hasn't had a sleep f/u appt in some time; suggest that she set up f[facility]/ [facility]...    Morbid Obesity> Wt is down 5# to 225# & BMI=41; she needs to put in the hard work w/ DIET/ EXERCISE/ WT REDUCTION as discussed...     Hx Esophagitis, GERD/reflux, stricture> she may need GI f/u but for now we reviewed Prilosec40 about 53m45mefore the eve meal, NPO after dinner, elev HOB 6" blocks...    Cardiac Hx> HBP, HxCP, HxPalpit, mult cardiac risk factors, +FamHx sudden cardiac death- followed by DrCroitoru...    Medical Hx> DM, Obesity, GERD, Divertics/ polyps, Back pain, Leg pain, Fatigue, Venous Insuffic/ edema- followed by Dr. Don Morrie Sheldonal... EXAM shows Afeb, VSS ,  O2sat=96% on RA; she is 225#, 5'2"Tall, BMI=41; HEENT- neg, mallampati2;  Chest- clear w/o w/r/r;  Heart- RR gr1-2/6 SEM w/o r/g;  Abd- obese soft neg;  Ext- VI, tr edema w/o c/c; Neuro- intact...   LABS 09/2015 in epic>  Chems- ok x BS=126, A1c=6.5;   IMP/PLAN>>  DonnMakaia+- stable but nothing really has changed;  We reviewed the recommendation for diet, exercise, wt reduction;  Rec to use IS device for "lung exerciser";  She will f/u w/ DrKelly for her sleep apnea...   ~  May 25, 2016:  82mo 42mo  w/ SN>  Pt cancelled 2 follow up appts in Aug & Sept-- returns today w/ CC back pain & she tells me that they want her to do shots or surg;  Followed for dyspnea (multifactorial w/ component from pulm restriction due to obesity, deconditioning, sedentary lifestyle, etc), cough (likely related to reflux/ GERD), and OSA on BiPAP per [facility];  She is taking Advair100-Bid + Prilosce40 and antireflux regimen;  DrKelly handles her BiPAP supplies and follow up of this problem...     She states that her breathing is OK/ same/ no change;  She is unable to exercise due to her back issues;  ADLs are ok & she walks to mailbox;  She tells me tat she is too scared to proceed w/ shots or surg for her back;  She notes mild cough, min sput, no hemoptysis, DOE w/ min activ and denies CP/ palpit/ edema...     She saw CARDS-DrCroitoru on 01/20/16>  HBP, asymptomatic PVCs, edema, HL, OSA w/ morbid obesity;  They discussed compliance w/ meds and BiPAP, no other changes needed...     She is not using her BiPAP regularly & she need sleep f/u w/ DrKelly to discuss her issues... EXAM shows Afeb, VSS , O2sat=97% on RA; she is up 10# to 235#, 5'2"Tall, BMI=42+; HEENT- neg, mallampati2;  Chest- clear w/o w/r/r;  Heart- RR gr1-2/6 SEM w/o r/g;  Abd- obese soft neg;  Ext- VI, tr edema w/o c/c; Neuro- intact...  IMP/PLAN>>  Ladonya is stable but has not made strides to improve her situation;  She needs an effective DIET/ EXERCISE program/ WEIGHT  REDUCTION & we reviewed strategies;  Continue Advair100Bid, get weight down, etc;  Getting back pain taken care of is a big part in getting back to exercise/ losing wt/ & improving her condition...   ~  September 01, 2017:  34moROV & nothing much has changed for DQuinnlyn she is still c/o SOB/DOE and has not dieted, exercised or lost any weight... She is followed for dyspnea (multifactorial w/ component from pulm restriction due to obesity, deconditioning, sedentary lifestyle, etc), cough (likely related to reflux/ GERD), and [facility]  She is supposed to be taking Advair100-Bid + Prilosce40 and antireflux regimen;  She tells me that the Advair is too expensive & when asked she notes that it costs $25-$50, and "it doesn't work";  I reviewed past PFTs (more restricted than obstructed) and prev CXRs (clear lungs), along w/ her functional eval )no exercise induced hypoxemia);  Then we reviewed her additional hx of OSA per work up by DGolden West Financial see above and his prescription for BiPAP- she prev indicated that she stopped the BiPAP on her own & now says that she is using the machine (she has not followed up w/ DrKelly in several yrs;  [facility] has an [facility]9 (not registered on the internet) & has not had a download in a long time, they will contact the pt to bring in her machine to check it out & check a download...     She had f/u colonoscopy 12/06/16 by DrNandigam> mult polyps found & removed, fragments of tub adenoma on path, they plan repeat colon 117yr genetic testing...    She had Genetic counseling 02/2017 due to hx colonic polyposis- notes reviewed, no mutations found & counseling given...    She saw PCP-MMartin,NP 05/04/17 for review of chronic problems>  Chr bonchitis, HBP, HL, GERD, DM2 (BS=126, A1c=6.4), Obesity (Wt=217#, BMI=41), osteopenia, anxiety, &  FM;  CC was LBP, they discussed diet & exercise, asked to f/u in 32mo..    [facility]>   Health reported to be about the same as last yr; note reviewed...  We reviewed the following medical problems during today's office visit >>     Dyspnea> multifactorial w/ components from pulm restriction due to obesity, deconditioning/sedentary, OSA w/ BiPAP noncompliance, cardiac factors;  Her pulm eval is benign other than some expected restriction from her obesity; ?if she's had any improvement on the Advair100?  In my opinion she will need to put in the hard work of DIET/ EXERCISE/ WT REDUCTION to see any improvement in her breathing...    Cough> this is most likely related to her GERD, reflux, choking episodes and she may need GI f/u; we reviewed antireflux regimen- Prilosec40 taken 373m before the eve meal, NPO after dinner, elev HOB 6" blocks; continue Tramadol50 prn cough...    Hx OSA on BiPAP per DrKelly> she has not been using the BiPAP & it appears as though she hasn't had a sleep f/u appt in some time; suggest that she [facility], [facility]   Morbid Obesity> Wt is essentially unchanged at 230# & BMI=43; she needs to put in the hard work w/ DIET/ EXERCISE/ WT REDUCTION as discussed...     Hx Esophagitis, GERD/reflux, stricture (plus divertics, colon polyps)> she may need GI f/u for this too, but for now we reviewed Prilosec40 about 3055mbefore the eve meal, NPO after dinner, elev HOB 6" blocks...    Cardiac Hx> HBP, HxCP, HxPalpit, mult cardiac risk factors, +FamHx sudden cardiac death- followed by DrCroitoru...    Medical Hx> DM, Obesity, GERD, Divertics/ polyps, Back pain, Leg pain, Fatigue, Venous Insuffic/ edema- followed by Dr. DonMorrie Sheldon al... EXAM shows Afeb, VSS , O2sat=98% on RA; wt= 230#, 5'1"Tall, BMI=42+; HEENT- neg, mallampati2;  Chest- clear w/o w/r/r;  Heart- RR gr1-2/6 SEM w/o r/g;  Abd- obese soft neg;  Ext- VI, tr edema w/o c/c; Neuro- intact...  Last labs in epic 04/2017 reviewed...  IMP/PLAN>>  Once again Rennee  is informed about her restrictive lung dis, obesity, deconditioning via lack of exercise; the only way to improve her situation is via DIET, EXERCISE & WT LOSS;  We offered referral to pulm rehab program & rec wt watchers and silver sneakers;  She discussed numerous reasons that exercise is impossible;  [facility] will try to get BiPAP download data;  Rec [facility] try [facility] daily instead of Adv[facility]...     Past Medical History  Diagnosis Date  . Asthma >> on ADVAIR100-2spBid per DrMoore     bronchitis  . Allergy     seasonal  . Arthritis   . GERD (gastroesophageal reflux disease) >> on Prilosec40   . Hyperlipidemia >> on Lipitor40   . Hypertension >> on Amlod5, Lotensin40, Lasix40am&20pm, Zaroxyln2.5 2d/wk   . Tubular adenoma of colon 05/09/12    "innumerable number" of flat polyps  . DJD (degenerative joint disease) >> on Neurontin300Bid & Flexeril5Tid prn   . Anxiety >> on Xanax0.25 prn   . Cataract   . History of abnormal mammogram     Past Surgical History:  Procedure Laterality Date  . CARPAL TUNNEL RELEASE Left 10/20/14   [facility]  . [place]  . COLONOSCOPY    .  LARYNX SURGERY     POLYP REMOVAL  . POLYPECTOMY     vocal cord  . TONSILLECTOMY  1965  . TUMOR REMOVAL     benign fatty tumors on abdomen    Outpatient Encounter Medications as of 09/01/2017  Medication Sig  . ALPRAZolam (XANAX) 0.25 MG tablet TAKE ONE TABLET BY MOUTH TWICE DAILY AS NEEDED  . amLODipine (NORVASC) 5 MG tablet Take 1 tablet (5 mg total) by mouth daily.  Marland Kitchen aspirin 81 MG tablet Take 81 mg by mouth as needed for pain.  Marland Kitchen atorvastatin (LIPITOR) 40 MG tablet TAKE 1 TABLET BY MOUTH ONCE DAILY  . benazepril (LOTENSIN) 40 MG tablet TAKE 1 TABLET BY MOUTH ONCE DAILY  . Blood Glucose Monitoring Suppl (LIBERTY BLOOD GLUCOSE MONITOR) DEVI 1 each by Does not apply route daily. Test 1x per day and prn-dx250.02  . Calcium-Vitamin D-Vitamin K (CALCIUM SOFT CHEWS) 500-100-40  MG-UNT-MCG CHEW Chew 1 each by mouth daily.  . cholecalciferol (VITAMIN D) 1000 UNITS tablet Take 1,000 Units by mouth daily.   . cyclobenzaprine (FLEXERIL) 5 MG tablet TAKE 1 BY MOUTH 3 TIMES DAILY AS NEEDED FOR MUSCLE SPASMS  --MARY MARTIN NP  . furosemide (LASIX) 40 MG tablet TAKE 1 BY MOUTH EVERY MORNING AND 1/2 BY MOUTH AT 1 IN THE AFTERNOON  . glucose blood test strip Test 1x per day and prn- dx 250.02  . meclizine (ANTIVERT) 25 MG tablet Take 1 tablet (25 mg total) by mouth 3 (three) times daily as needed for dizziness.  . metFORMIN (GLUCOPHAGE) 500 MG tablet TAKE 1 TABLET BY MOUTH TWICE DAILY WITH MEALS  . metolazone (ZAROXOLYN) 2.5 MG tablet 1 po 2x a week as needed  . omeprazole (PRILOSEC) 40 MG capsule TAKE 1 CAPSULE BY MOUTH ONCE DAILY  . ONE TOUCH ULTRA TEST test strip USE ONE STRIP TO CHECK GLUCOSE ONCE DAILY  . ONETOUCH DELICA LANCETS 97D MISC 1 each by Does not apply route daily. Test 1X per day and prn-- dx 250.02  . traMADol (ULTRAM) 50 MG tablet Take 1 tablet (50 mg total) by mouth daily as needed.  . [DISCONTINUED] Fluticasone-Salmeterol (ADVAIR) 100-50 MCG/DOSE AEPB Inhale 1 puff into the lungs 2 (two) times daily.  Marland Kitchen umeclidinium-vilanterol (ANORO ELLIPTA) 62.5-25 MCG/INH AEPB Inhale 1 puff into the lungs daily.  . [DISCONTINUED] umeclidinium-vilanterol (ANORO ELLIPTA) 62.5-25 MCG/INH AEPB Inhale 1 puff into the lungs daily.  . [DISCONTINUED] 0.9 %  sodium chloride infusion    No facility-administered encounter medications on file as of 09/01/2017.     Immunization History  Administered Date(s) Administered  . Influenza,inj,Quad PF,6+ Mos 05/28/2014, 06/16/2015, 06/10/2016  . Influenza-Unspecified 04/24/2017  . Pneumococcal Conjugate-13 07/21/2014  . Pneumococcal Polysaccharide-23 09/21/2015  . Zoster Recombinat (Shingrix) 04/24/2017, 07/11/2017    Current Medications, Allergies, Past Medical History, Past Surgical History, Family History, and Social History were  reviewed in [employer] record.   Review of Systems             All symptoms NEG except where BOLDED >>  Constitutional:  F/C/S, fatigue, anorexia, unexpected weight change. HEENT:  HA, visual changes, hearing loss, earache, nasal symptoms, sore throat, mouth sores, hoarseness. Resp:  cough, sputum, hemoptysis; SOB, tightness, wheezing. Cardio:  CP, palpit, DOE, orthopnea, edema. GI:  N/V/D/C, blood in stool; reflux, abd pain, distention, gas. GU:  dysuria, freq, urgency, hematuria, flank pain, voiding difficulty. MS:  joint pain, swelling, tenderness, decr ROM; neck pain, back pain, etc. Neuro:  HA, tremors, seizures,  dizziness, syncope, weakness, numbness, gait abn. Skin:  suspicious lesions or skin rash. Heme:  adenopathy, bruising, bleeding. Psyche:  confusion, agitation, sleep disturbance, hallucinations, anxiety, depression suicidal.   Objective:   Physical Exam       Vital Signs:  Reviewed...   General:  WD, Obese, 72 y/o WM in NAD; alert & oriented; pleasant & cooperative... HEENT:  [place]/AT; Conjunctiva- pink, Sclera- nonicteric, EOM-wnl, PERRLA, EACs-clear, TMs-wnl; NOSE-clear; THROAT-clear & wnl.  Neck:  Supple w/ fair ROM; no JVD; normal carotid impulses w/o bruits; no thyromegaly or nodules palpated; no lymphadenopathy.  Chest:  Clear to P & A; without wheezes, rales, or rhonchi heard. Heart:  Regular Rhythm; norm S1 & S2 Gr1-2/6 SEM w/o rubs or gallops detected. Abdomen:  Obese, soft & nontender- no guarding or rebound; normal bowel sounds; no organomegaly or masses palpated. Ext:  decr ROM; without deformities +arthritic changes; no varicose veins, +venous insuffic, tr edema;  Pulses intact w/o bruits. Neuro:  CNs II-XII intact; motor testing normal; no focal neuro deficit Derm:  No lesions noted; no rash etc. Lymph:  No cervical, supraclavicular, axillary, or inguinal adenopathy palpated.   Assessment:      IMP>>      Dyspnea> multifactorial  w/ components from pulm restriction due to obesity, deconditioning/sedentary, OSA w/ BiPAP noncompliance, cardiac factors;  Her pulm eval is benign other than some expected restriction from her obesity; ?if she's had any improvement on the Advair100?  In my opinion she will need to put in the hard work of DIET/ EXERCISE/ WT REDUCTION to see any improvement in her breathing...    Cough> this is most likely related to her GERD, reflux, choking episodes and she may need GI f/u soon; for now we reviewed antireflux regimen- Prilosec40 taken 70mn before the eve meal, NPO after dinner, elev HOB 6" blocks;  We will try TRAMADOL534mTid prn for the cough...    Hx OSA on BiPAP per DrKelly> she has not been using the BiPAP & it appears as though she hasn't had a sleep f/u appt in some time; suggest that she set up f/[facility] appt [facility]/ [facility]...    Morbid Obesity> she needs to put in the hard work w/ DIET/ EXERCISE/ WT REDUCTION as discussed...     Hx Esophagitis, GERD/reflux, stricture> she may need GI f/u soon but for now we reviewed Prilosec40 about 3026mbefore the eve meal, NPO after dinner, elev HOB 6" blocks...    Cardiac Hx> HBP, HxCP, HxPalpit, mult cardiac risk factors, +FamHx sudden cardiac death- followed by DrCroitoru...    Medical Hx> DM, Obesity, GERD, Divertics/ polyps, Back pain, Leg pain, Fatigue, Venous Insuffic/ edema- followed by Dr. DonMorrie Sheldon al...  PLAN>>   08/04/15>   Jerry's dyspnea appears to be multifactorial w/ components from pulm restriction due to obesity, deconditioning, OSA (not using CPAP regularly), cardiac factors;  Her cough is likely related to her reflux/ GERD/ choking episodes & she needs a vigorous antireflux regimen, plus her PPI, and trial of Tramadol to suppress the cough (may need to STOP her ACE rx later);  Overall improvement in her dyspnea is going to require her to get on a diet, increase her exercise program, and get her weight down but she does not appear  very motivated;  She also needs to get back on her BiPAP for her OSA- I suggested to her to f/u w/ DrKQuentin Oreo did her initial sleep eval...  11/18/15>   DonJontae +-  stable but nothing really has changed;  We reviewed the recommendation for diet, exercise, wt reduction;  Rec to use IS device for "lung exerciser";  She will f/u w/ DrKelly for her sleep apnea...   05/25/16>   Shequila is stable but has not made strides to improve her situation;  She needs an effective DIET/ EXERCISE program/ WEIGHT REDUCTION & we reviewed strategies;  Continue Advair100Bid, get weight down, etc;  Getting back pain taken care of is a big part in getting back to exercise/ losing wt/ & improving her condition.  09/01/17>   Once again Sydni is informed about her restrictive lung dis, obesity, deconditioning via lack of exercise; the only way to improve her situation is via DIET, EXERCISE & WT LOSS;  We offered referral to pulm rehab program & rec wt watchers and silver sneakers;  She discussed numerous reasons that exercise is impossible;  [facility] will try to get BiPAP download data;  Rec to try [facility] 1 inhalation daily instead of Advair...    Plan:     Patient's Medications  New Prescriptions   UMECLIDINIUM-VILANTEROL (ANORO ELLIPTA) 62.5-25 MCG/INH AEPB    Inhale 1 puff into the lungs daily.  Previous Medications   ALPRAZOLAM (XANAX) 0.25 MG TABLET    TAKE ONE TABLET BY MOUTH TWICE DAILY AS NEEDED   AMLODIPINE (NORVASC) 5 MG TABLET    Take 1 tablet (5 mg total) by mouth daily.   ASPIRIN 81 MG TABLET    Take 81 mg by mouth as needed for pain.   ATORVASTATIN (LIPITOR) 40 MG TABLET    TAKE 1 TABLET BY MOUTH ONCE DAILY   BENAZEPRIL (LOTENSIN) 40 MG TABLET    TAKE 1 TABLET BY MOUTH ONCE DAILY   BLOOD GLUCOSE MONITORING SUPPL (LIBERTY BLOOD GLUCOSE MONITOR) DEVI    1 each by Does not apply route daily. Test 1x per day and prn-dx250.02   CALCIUM-VITAMIN D-VITAMIN K (CALCIUM SOFT CHEWS) 500-100-40 MG-UNT-MCG CHEW    Chew 1 each by mouth  daily.   CHOLECALCIFEROL (VITAMIN D) 1000 UNITS TABLET    Take 1,000 Units by mouth daily.    CYCLOBENZAPRINE (FLEXERIL) 5 MG TABLET    TAKE 1 BY MOUTH 3 TIMES DAILY AS NEEDED FOR MUSCLE SPASMS  --MARY MARTIN NP   FUROSEMIDE (LASIX) 40 MG TABLET    TAKE 1 BY MOUTH EVERY MORNING AND 1/2 BY MOUTH AT 1 IN THE AFTERNOON   GLUCOSE BLOOD TEST STRIP    Test 1x per day and prn- dx 250.02   MECLIZINE (ANTIVERT) 25 MG TABLET    Take 1 tablet (25 mg total) by mouth 3 (three) times daily as needed for dizziness.   METFORMIN (GLUCOPHAGE) 500 MG TABLET    TAKE 1 TABLET BY MOUTH TWICE DAILY WITH MEALS   METOLAZONE (ZAROXOLYN) 2.5 MG TABLET    1 po 2x a week as needed   OMEPRAZOLE (PRILOSEC) 40 MG CAPSULE    TAKE 1 CAPSULE BY MOUTH ONCE DAILY   ONE TOUCH ULTRA TEST TEST STRIP    USE ONE STRIP TO CHECK GLUCOSE ONCE DAILY   ONETOUCH DELICA LANCETS 06Y MISC    1 each by Does not apply route daily. Test 1X per day and prn-- dx 250.02   TRAMADOL (ULTRAM) 50 MG TABLET    Take 1 tablet (50 mg total) by mouth daily as needed.  Modified Medications   No medications on file  Discontinued Medications   FLUTICASONE-SALMETEROL (ADVAIR) 100-50 MCG/DOSE AEPB    Inhale  1 puff into the lungs 2 (two) times daily.

## 2017-09-01 NOTE — Patient Instructions (Signed)
Today we updated your med list in our EPIC system...     We decided to change your ADVAIR to [facility] - one inhalation every day... To see if this medication helps your breathing more than the other inhalers...  To help your breathing> You still NEED TO LOSE THE WEIGHT (through Diet & Exercise)...       Avoid CARBS & FATS, try weight watchers, etc...       Join an exercise program like Silver Sneakers...       We will find out if you are eligible for Pulmonary Rehab at [facility]...  You should follow up w/ your Sleep doctor- Quentin Ore, to be sure your machine if working properly & you are getting the benefits you deserve from the BiPAP...  Marland KitchenCall for any questions or if we can be of service in any way.Marland KitchenMarland Kitchen

## 2017-09-04 DIAGNOSIS — G4733 Obstructive sleep apnea (adult) (pediatric): Secondary | ICD-10-CM | POA: Diagnosis not present

## 2017-09-14 ENCOUNTER — Ambulatory Visit: Payer: PPO | Admitting: Pulmonary Disease

## 2017-09-17 ENCOUNTER — Other Ambulatory Visit: Payer: Self-pay | Admitting: Nurse Practitioner

## 2017-09-17 DIAGNOSIS — M5442 Lumbago with sciatica, left side: Principal | ICD-10-CM

## 2017-09-17 DIAGNOSIS — G8929 Other chronic pain: Secondary | ICD-10-CM

## 2017-09-18 ENCOUNTER — Other Ambulatory Visit: Payer: Self-pay | Admitting: Nurse Practitioner

## 2017-09-18 DIAGNOSIS — M5442 Lumbago with sciatica, left side: Principal | ICD-10-CM

## 2017-09-18 DIAGNOSIS — G8929 Other chronic pain: Secondary | ICD-10-CM

## 2017-09-29 ENCOUNTER — Other Ambulatory Visit: Payer: Self-pay | Admitting: Nurse Practitioner

## 2017-09-29 DIAGNOSIS — M5442 Lumbago with sciatica, left side: Principal | ICD-10-CM

## 2017-09-29 DIAGNOSIS — I10 Essential (primary) hypertension: Secondary | ICD-10-CM

## 2017-09-29 DIAGNOSIS — G8929 Other chronic pain: Secondary | ICD-10-CM

## 2017-10-02 ENCOUNTER — Encounter (HOSPITAL_COMMUNITY): Payer: PPO

## 2017-10-09 ENCOUNTER — Other Ambulatory Visit: Payer: Self-pay | Admitting: Nurse Practitioner

## 2017-10-11 ENCOUNTER — Telehealth: Payer: Self-pay | Admitting: Nurse Practitioner

## 2017-10-11 NOTE — Telephone Encounter (Signed)
Lancets sent in earlier today

## 2017-11-02 ENCOUNTER — Other Ambulatory Visit: Payer: Self-pay | Admitting: Nurse Practitioner

## 2017-11-02 DIAGNOSIS — K219 Gastro-esophageal reflux disease without esophagitis: Secondary | ICD-10-CM

## 2017-11-13 ENCOUNTER — Encounter: Payer: Self-pay | Admitting: Gastroenterology

## 2017-11-21 ENCOUNTER — Other Ambulatory Visit: Payer: Self-pay | Admitting: Nurse Practitioner

## 2017-11-21 DIAGNOSIS — I10 Essential (primary) hypertension: Secondary | ICD-10-CM

## 2017-11-29 ENCOUNTER — Other Ambulatory Visit: Payer: Self-pay | Admitting: Nurse Practitioner

## 2017-11-29 DIAGNOSIS — E119 Type 2 diabetes mellitus without complications: Secondary | ICD-10-CM

## 2017-11-29 NOTE — Telephone Encounter (Signed)
Last seen 04/2017  MMM

## 2017-12-05 NOTE — Telephone Encounter (Signed)
Last refill without being seen 

## 2017-12-08 ENCOUNTER — Other Ambulatory Visit: Payer: Self-pay | Admitting: Nurse Practitioner

## 2017-12-08 DIAGNOSIS — K219 Gastro-esophageal reflux disease without esophagitis: Secondary | ICD-10-CM

## 2017-12-08 NOTE — Telephone Encounter (Signed)
Last refill without being seen 

## 2017-12-08 NOTE — Telephone Encounter (Signed)
Last seen 05/04/17   MMM

## 2017-12-09 ENCOUNTER — Ambulatory Visit (INDEPENDENT_AMBULATORY_CARE_PROVIDER_SITE_OTHER): Payer: PPO | Admitting: Family Medicine

## 2017-12-09 ENCOUNTER — Encounter: Payer: Self-pay | Admitting: Family Medicine

## 2017-12-09 VITALS — BP 136/56 | HR 75 | Temp 97.6°F | Ht 61.0 in | Wt 230.0 lb

## 2017-12-09 DIAGNOSIS — M5442 Lumbago with sciatica, left side: Secondary | ICD-10-CM

## 2017-12-09 DIAGNOSIS — M5441 Lumbago with sciatica, right side: Secondary | ICD-10-CM

## 2017-12-09 DIAGNOSIS — G8929 Other chronic pain: Secondary | ICD-10-CM

## 2017-12-09 MED ORDER — PREDNISONE 10 MG (21) PO TBPK: ORAL_TABLET | ORAL | 0 refills | Status: DC

## 2017-12-09 MED ORDER — TRAMADOL HCL 50 MG PO TABS: 50.0000 mg | ORAL_TABLET | Freq: Every day | ORAL | 0 refills | Status: DC | PRN

## 2017-12-09 MED ORDER — METHYLPREDNISOLONE ACETATE 80 MG/ML IJ SUSP
80.0000 mg | Freq: Once | INTRAMUSCULAR | Status: AC
  Administered 2017-12-09: 80 mg via INTRAMUSCULAR

## 2017-12-09 NOTE — Progress Notes (Signed)
Subjective: CC: Low back pain/ sciatica PCP: Chevis Pretty, FNP Stacey Summers is a 72 y.o. female presenting to clinic today for:  1. Sciatica Patient reports that she started having a flare of her sciatica on the right over the last couple of days.  She notes that she had been driving around running errands for a longer time than usual, which she thinks it may be flared up her symptoms.  She notes pain that starts in the right side of the low back and radiates down to the foot.  She describes his pain as severe.  It was much worse last evening.  She has been told in the past that she needs surgery in the low back but she is trying to avoid this.  She denies saddle anesthesia, fecal incontinence or urinary retention.  She does occasionally have sensation changes down the leg.  She does have a past medical history of type 2 diabetes but this is well controlled with most recent A1c of less than 7.  No frequent steroid use.   ROS: Per HPI  Not on File Past Medical History:  Diagnosis Date  . Allergy    seasonal  . Anxiety   . Arthritis    hands, back   . Asthma    bronchitis  . Cataract    removed both eyes  . Chronic back pain    lower back   . COPD (chronic obstructive pulmonary disease) ([place])   . Diabetes mellitus without complication ([place])   . DJD (degenerative joint disease)   . Fibromyalgia   . Genetic testing 03/08/2017   Stacey Summers underwent genetic counseling and testing for hereditary cancer syndromes on 02/21/2017. Her results were negative for mutations in all 83 genes analyzed by [facility]'s 83-gene Multi-Cancers Panel. Genes analyzed include: ALK, APC, ATM, AXIN2, BAP1, BARD1, BLM, BMPR1A, BRCA1, BRCA2, BRIP1, CASR, CDC73, CDH1, CDK4, CDKN1B, CDKN1C, CDKN2A, CEBPA, CHEK2, CTNNA1, DICER1, DIS3L2, EGFR, EPCAM  . GERD (gastroesophageal reflux disease)   . History of abnormal mammogram   . Hyperlipidemia   . Hypertension   . Irregular heart beat   .  Neuromuscular disorder (HCC)    fibromyalgia  . Sleep apnea    cpap some  . Tubular adenoma of colon 05/09/12   "innumerable number" of flat polyps    Current Outpatient Medications:  .  ALPRAZolam (XANAX) 0.25 MG tablet, TAKE ONE TABLET BY MOUTH TWICE DAILY AS NEEDED, Disp: 30 tablet, Rfl: 0 .  amLODipine (NORVASC) 5 MG tablet, TAKE 1 TABLET BY MOUTH ONCE DAILY, Disp: 180 tablet, Rfl: 0 .  aspirin 81 MG tablet, Take 81 mg by mouth as needed for pain., Disp: , Rfl:  .  atorvastatin (LIPITOR) 40 MG tablet, TAKE 1 TABLET BY MOUTH ONCE DAILY, Disp: 90 tablet, Rfl: 1 .  benazepril (LOTENSIN) 40 MG tablet, TAKE 1 TABLET BY MOUTH ONCE DAILY, Disp: 90 tablet, Rfl: 0 .  Blood Glucose Monitoring Suppl (LIBERTY BLOOD GLUCOSE MONITOR) DEVI, 1 each by Does not apply route daily. Test 1x per day and prn-dx250.02, Disp: 1 each, Rfl: 0 .  Calcium-Vitamin D-Vitamin K (CALCIUM SOFT CHEWS) 500-100-40 MG-UNT-MCG CHEW, Chew 1 each by mouth daily., Disp: , Rfl:  .  cholecalciferol (VITAMIN D) 1000 UNITS tablet, Take 1,000 Units by mouth daily. , Disp: , Rfl:  .  cyclobenzaprine (FLEXERIL) 5 MG tablet, TAKE 1 BY MOUTH 3 TIMES DAILY AS NEEDED FOR MUSCLE SPASMS  --MARY MARTIN NP, Disp: 30 tablet, Rfl: 0 .  furosemide (LASIX) 40 MG tablet, TAKE 1 BY MOUTH EVERY MORNING AND 1/2 BY MOUTH AT 1 IN THE AFTERNOON, Disp: 135 tablet, Rfl: 1 .  glucose blood test strip, Test 1x per day and prn- dx 250.02, Disp: 100 each, Rfl: 2 .  meclizine (ANTIVERT) 25 MG tablet, Take 1 tablet (25 mg total) by mouth 3 (three) times daily as needed for dizziness., Disp: 30 tablet, Rfl: 0 .  metFORMIN (GLUCOPHAGE) 500 MG tablet, TAKE 1 TABLET BY MOUTH TWICE DAILY WITH MEALS, Disp: 180 tablet, Rfl: 0 .  metolazone (ZAROXOLYN) 2.5 MG tablet, 1 po 2x a week as needed, Disp: 10 tablet, Rfl: 1 .  omeprazole (PRILOSEC) 40 MG capsule, TAKE 1 CAPSULE BY MOUTH ONCE DAILY, Disp: 90 capsule, Rfl: 0 .  omeprazole (PRILOSEC) 40 MG capsule, TAKE 1 CAPSULE  BY MOUTH ONCE DAILY, Disp: 90 capsule, Rfl: 0 .  ONE TOUCH ULTRA TEST test strip, USE ONE STRIP TO CHECK GLUCOSE ONCE DAILY, Disp: 100 each, Rfl: 1 .  ONETOUCH DELICA LANCETS 98X MISC, USE ONE LANCET TO CHECK GLUCOSE ONCE DAILY AND AS NEEDED, Disp: 100 each, Rfl: 0 .  umeclidinium-vilanterol (ANORO ELLIPTA) 62.5-25 MCG/INH AEPB, Inhale 1 puff into the lungs daily., Disp: 14 each, Rfl: 0 .  traMADol (ULTRAM) 50 MG tablet, Take 1 tablet (50 mg total) by mouth daily as needed. (Patient not taking: Reported on 12/09/2017), Disp: 30 tablet, Rfl: 0 Social History   Socioeconomic History  . Marital status: Married    Spouse name: Not on file  . Number of children: 3  . Years of education: Not on file  . Highest education level: Not on file  Occupational History  . Occupation: retired  Scientific laboratory technician  . Financial resource strain: Not on file  . Food insecurity:    Worry: Not on file    Inability: Not on file  . Transportation needs:    Medical: Not on file    Non-medical: Not on file  Tobacco Use  . Smoking status: Former Smoker    Packs/day: 1.00    Years: 3.00    Pack years: 3.00    Types: Cigarettes    Last attempt to quit: 08/15/1969    Years since quitting: 48.3  . Smokeless tobacco: Never Used  Substance and Sexual Activity  . Alcohol use: No    Alcohol/week: 0.0 oz  . Drug use: No  . Sexual activity: Yes  Lifestyle  . Physical activity:    Days per week: Not on file    Minutes per session: Not on file  . Stress: Not on file  Relationships  . Social connections:    Talks on phone: Not on file    Gets together: Not on file    Attends religious service: Not on file    Active member of club or organization: Not on file    Attends meetings of clubs or organizations: Not on file    Relationship status: Not on file  . Intimate partner violence:    Fear of current or ex partner: Not on file    Emotionally abused: Not on file    Physically abused: Not on file    Forced sexual  activity: Not on file  Other Topics Concern  . Not on file  Social History Narrative  . Not on file   Family History  Problem Relation Age of Onset  . Prostate cancer Father        d.89 diagnosed in late-60s  . Colon  cancer Father        diagnosed at 28  . Heart failure Mother        d.37  . Heart disease Mother   . Colon polyps Brother        polyposis. Underwent partial colectomy at age 29.  Marland Kitchen Spina bifida Brother        died at birth  . Stomach cancer Paternal Aunt   . Brain cancer Cousin 19       paternal first-cousin-once-removed  . Kidney cancer Cousin        d.78s maternal first-cousin  . Other Other 16       multiple lipomas  . Colon cancer Paternal Uncle        in his 15s  . Heart failure Maternal Grandmother        d.45  . Heart failure Maternal Uncle        d.50  . Colon polyps Son 85  . Heart attack Son 86  . Esophageal cancer Neg Hx   . Rectal cancer Neg Hx     Objective: Office vital signs reviewed. BP (!) 136/56   Pulse 75   Temp 97.6 F (36.4 C) (Oral)   Ht _0  (1.549 m)   Wt 230 lb (104.3 kg)   BMI 43.46 kg/m   Physical Examination:  General: Awake, alert, morbidly obese, using walker for ambulation MSK: Notable pain with rising from a seated position.  She appears uncomfortable when attempting to stand up straight.  No gross scoliotic curve noted.  No midline tenderness to palpation to the lumbar spine.  She does have moderate tenderness to palpation along the right side over the lumbosacral junction and SI joint.  She continues to have tenderness to palpation into the right gluteus. Neuro light touch sensation to lower extremities intact.  Strength testing was not performed during today's exam secondary to patient's discomfort.   Assessment/ Plan: 72 y.o. female   1. Chronic right-sided low back pain with right-sided sciatica Patient with acute on chronic right-sided low back pain with right-sided sciatica.  She was given a dose of  Depo-Medrol 80 mg here in office.  I have also prescribed her a prednisone Dosepak to take for the next 6 days starting tomorrow.  I have refilled a small quantity of her tramadol.  We discussed that future refills of this medication should be provided by her PCP.  She voiced good understanding and will follow with PCP as directed.  Reasons for return evaluation and emergent evaluation the emergency department discussed.  Patient was good understanding and will follow-up as needed. - methylPREDNISolone acetate (DEPO-MEDROL) injection 80 mg  2. Chronic left-sided low back pain with left-sided sciatica - traMADol (ULTRAM) 50 MG tablet; Take 1 tablet (50 mg total) by mouth daily as needed.  Dispense: 15 tablet; Refill: 0  Meds ordered this encounter  Medications  . methylPREDNISolone acetate (DEPO-MEDROL) injection 80 mg  . DISCONTD: traMADol (ULTRAM) 50 MG tablet    Sig: Take 1 tablet (50 mg total) by mouth daily as needed.    Dispense:  15 tablet    Refill:  0  . predniSONE (STERAPRED UNI-PAK 21 TAB) 10 MG (21) TBPK tablet    Sig: As directed x 6 days. Start 12/10/17    Dispense:  21 tablet    Refill:  0  . traMADol (ULTRAM) 50 MG tablet    Sig: Take 1 tablet (50 mg total) by mouth daily as needed.  Dispense:  15 tablet    Refill:  0   The Narcotic Database has been reviewed.  There were no red flags.     Janora Norlander, DO [place] [phone]

## 2017-12-09 NOTE — Patient Instructions (Signed)
Sciatica Sciatica is pain, numbness, weakness, or tingling along your sciatic nerve. The sciatic nerve starts in the lower back and goes down the back of each leg. Sciatica happens when this nerve is pinched or has pressure put on it. Sciatica usually goes away on its own or with treatment. Sometimes, sciatica may keep coming back (recur). Follow these instructions at home: Medicines  Take over-the-counter and prescription medicines only as told by your doctor.  Do not drive or use heavy machinery while taking prescription pain medicine. Managing pain  If directed, put ice on the affected area. ? Put ice in a plastic bag. ? Place a towel between your skin and the bag. ? Leave the ice on for 20 minutes, 2-3 times a day.  After icing, apply heat to the affected area before you exercise or as often as told by your doctor. Use the heat source that your doctor tells you to use, such as a moist heat pack or a heating pad. ? Place a towel between your skin and the heat source. ? Leave the heat on for 20-30 minutes. ? Remove the heat if your skin turns bright red. This is especially important if you are unable to feel pain, heat, or cold. You may have a greater risk of getting burned. Activity  Return to your normal activities as told by your doctor. Ask your doctor what activities are safe for you. ? Avoid activities that make your sciatica worse.  Take short rests during the day. Rest in a lying or standing position. This is usually better than sitting to rest. ? When you rest for a long time, do some physical activity or stretching between periods of rest. ? Avoid sitting for a long time without moving. Get up and move around at least one time each hour.  Exercise and stretch regularly, as told by your doctor.  Do not lift anything that is heavier than 10 lb (4.5 kg) while you have symptoms of sciatica. ? Avoid lifting heavy things even when you do not have symptoms. ? Avoid lifting heavy  things over and over.  When you lift objects, always lift in a way that is safe for your body. To do this, you should: ? Bend your knees. ? Keep the object close to your body. ? Avoid twisting. General instructions  Use good posture. ? Avoid leaning forward when you are sitting. ? Avoid hunching over when you are standing.  Stay at a healthy weight.  Wear comfortable shoes that support your feet. Avoid wearing high heels.  Avoid sleeping on a mattress that is too soft or too hard. You might have less pain if you sleep on a mattress that is firm enough to support your back.  Keep all follow-up visits as told by your doctor. This is important. Contact a doctor if:  You have pain that: ? Wakes you up when you are sleeping. ? Gets worse when you lie down. ? Is worse than the pain you have had in the past. ? Lasts longer than 4 weeks.  You lose weight for without trying. Get help right away if:  You cannot control when you pee (urinate) or poop (have a bowel movement).  You have weakness in any of these areas and it gets worse. ? Lower back. ? Lower belly (pelvis). ? Butt (buttocks). ? Legs.  You have redness or swelling of your back.  You have a burning feeling when you pee. This information is not intended to replace  advice given to you by your health care provider. Make sure you discuss any questions you have with your health care provider. Document Released: 05/10/2008 Document Revised: 01/07/2016 Document Reviewed: 04/10/2015 Elsevier Interactive Patient Education  [employer].

## 2017-12-19 ENCOUNTER — Ambulatory Visit (INDEPENDENT_AMBULATORY_CARE_PROVIDER_SITE_OTHER): Payer: PPO | Admitting: Family Medicine

## 2017-12-19 ENCOUNTER — Encounter: Payer: Self-pay | Admitting: Family Medicine

## 2017-12-19 VITALS — BP 133/68 | HR 76 | Temp 97.7°F | Ht 61.0 in | Wt 228.0 lb

## 2017-12-19 DIAGNOSIS — G8929 Other chronic pain: Secondary | ICD-10-CM | POA: Insufficient documentation

## 2017-12-19 DIAGNOSIS — M25561 Pain in right knee: Secondary | ICD-10-CM | POA: Diagnosis not present

## 2017-12-19 MED ORDER — METHYLPREDNISOLONE ACETATE 40 MG/ML IJ SUSP
40.0000 mg | Freq: Once | INTRAMUSCULAR | Status: AC
  Administered 2017-12-19: 40 mg via INTRAMUSCULAR

## 2017-12-19 NOTE — Addendum Note (Signed)
Addended byCarrolyn Leigh on: 12/19/2017 04:51 PM   Modules accepted: Orders

## 2017-12-19 NOTE — Progress Notes (Signed)
Subjective: CC: right knee pain PCP: Chevis Pretty, FNP [id number] L Armes is a 72 y.o. female presenting to clinic today for:  1. Right knee pain Patient reports a chronic history of right knee pain.  She notes that the flare currently started around the same time as her right lower back pain, which she was seen on 12/09/2017 for.  She is status post a prednisone Dosepak.  She notes little improvement with the right knee pain.  Her last corticosteroid injection to the right knee with several years ago by Dr. Collie Siad.  She has not been having any improvement in the right knee pain with tramadol.  She is been told in the past that she needs knee surgery but notes that she is not followed up for this.  She notes that pain is worsened by walking and prolonged standing.  It is relieved by laying down.  She notes swelling in her knees bilaterally.  No increased warmth or erythema.  She occasionally has numbness and tingling in both feet.  No current numbness or tingling.  She has been using a cane for ambulation to help offset the weight that she has to put on that right leg.  Past medical history is significant for type 2 diabetes.  Blood sugar has been well controlled.  Not currently on any anticoagulation except for a baby aspirin.  Additionally, she notes that her right-sided back pain has improved after steroid taper last month.  ROS: Per HPI  Not on File Past Medical History:  Diagnosis Date  . Allergy    seasonal  . Anxiety   . Arthritis    hands, back   . Asthma    bronchitis  . Cataract    removed both eyes  . Chronic back pain    lower back   . COPD (chronic obstructive pulmonary disease) ([place])   . Diabetes mellitus without complication ([place])   . DJD (degenerative joint disease)   . Fibromyalgia   . Genetic testing 03/08/2017   Ms. Onofre underwent genetic counseling and testing for hereditary cancer syndromes on 02/21/2017. Her results were negative for mutations in  all 83 genes analyzed by [facility]'s 83-gene Multi-Cancers Panel. Genes analyzed include: ALK, APC, ATM, AXIN2, BAP1, BARD1, BLM, BMPR1A, BRCA1, BRCA2, BRIP1, CASR, CDC73, CDH1, CDK4, CDKN1B, CDKN1C, CDKN2A, CEBPA, CHEK2, CTNNA1, DICER1, DIS3L2, EGFR, EPCAM  . GERD (gastroesophageal reflux disease)   . History of abnormal mammogram   . Hyperlipidemia   . Hypertension   . Irregular heart beat   . Neuromuscular disorder (HCC)    fibromyalgia  . Sleep apnea    cpap some  . Tubular adenoma of colon 05/09/12   "innumerable number" of flat polyps    Current Outpatient Medications:  .  ALPRAZolam (XANAX) 0.25 MG tablet, TAKE ONE TABLET BY MOUTH TWICE DAILY AS NEEDED, Disp: 30 tablet, Rfl: 0 .  amLODipine (NORVASC) 5 MG tablet, TAKE 1 TABLET BY MOUTH ONCE DAILY, Disp: 180 tablet, Rfl: 0 .  aspirin 81 MG tablet, Take 81 mg by mouth as needed for pain., Disp: , Rfl:  .  atorvastatin (LIPITOR) 40 MG tablet, TAKE 1 TABLET BY MOUTH ONCE DAILY, Disp: 90 tablet, Rfl: 1 .  benazepril (LOTENSIN) 40 MG tablet, TAKE 1 TABLET BY MOUTH ONCE DAILY, Disp: 90 tablet, Rfl: 0 .  Blood Glucose Monitoring Suppl (LIBERTY BLOOD GLUCOSE MONITOR) DEVI, 1 each by Does not apply route daily. Test 1x per day and prn-dx250.02, Disp: 1 each, Rfl: 0 .  cholecalciferol (VITAMIN D) 1000 UNITS tablet, Take 1,000 Units by mouth daily. , Disp: , Rfl:  .  cyclobenzaprine (FLEXERIL) 5 MG tablet, TAKE 1 BY MOUTH 3 TIMES DAILY AS NEEDED FOR MUSCLE SPASMS  --MARY MARTIN NP, Disp: 30 tablet, Rfl: 0 .  furosemide (LASIX) 40 MG tablet, TAKE 1 BY MOUTH EVERY MORNING AND 1/2 BY MOUTH AT 1 IN THE AFTERNOON, Disp: 135 tablet, Rfl: 1 .  glucose blood test strip, Test 1x per day and prn- dx 250.02, Disp: 100 each, Rfl: 2 .  meclizine (ANTIVERT) 25 MG tablet, Take 1 tablet (25 mg total) by mouth 3 (three) times daily as needed for dizziness., Disp: 30 tablet, Rfl: 0 .  metFORMIN (GLUCOPHAGE) 500 MG tablet, TAKE 1 TABLET BY MOUTH TWICE DAILY WITH  MEALS, Disp: 180 tablet, Rfl: 0 .  metolazone (ZAROXOLYN) 2.5 MG tablet, 1 po 2x a week as needed (Patient not taking: Reported on 12/19/2017), Disp: 10 tablet, Rfl: 1 .  omeprazole (PRILOSEC) 40 MG capsule, TAKE 1 CAPSULE BY MOUTH ONCE DAILY, Disp: 90 capsule, Rfl: 0 .  omeprazole (PRILOSEC) 40 MG capsule, TAKE 1 CAPSULE BY MOUTH ONCE DAILY, Disp: 90 capsule, Rfl: 0 .  ONE TOUCH ULTRA TEST test strip, USE ONE STRIP TO CHECK GLUCOSE ONCE DAILY, Disp: 100 each, Rfl: 1 .  ONETOUCH DELICA LANCETS 69G MISC, USE ONE LANCET TO CHECK GLUCOSE ONCE DAILY AND AS NEEDED, Disp: 100 each, Rfl: 0 .  traMADol (ULTRAM) 50 MG tablet, Take 1 tablet (50 mg total) by mouth daily as needed., Disp: 15 tablet, Rfl: 0 .  umeclidinium-vilanterol (ANORO ELLIPTA) 62.5-25 MCG/INH AEPB, Inhale 1 puff into the lungs daily., Disp: 14 each, Rfl: 0 Social History   Socioeconomic History  . Marital status: Married    Spouse name: Not on file  . Number of children: 3  . Years of education: Not on file  . Highest education level: Not on file  Occupational History  . Occupation: retired  Scientific laboratory technician  . Financial resource strain: Not on file  . Food insecurity:    Worry: Not on file    Inability: Not on file  . Transportation needs:    Medical: Not on file    Non-medical: Not on file  Tobacco Use  . Smoking status: Former Smoker    Packs/day: 1.00    Years: 3.00    Pack years: 3.00    Types: Cigarettes    Last attempt to quit: 08/15/1969    Years since quitting: 48.3  . Smokeless tobacco: Never Used  Substance and Sexual Activity  . Alcohol use: No    Alcohol/week: 0.0 oz  . Drug use: No  . Sexual activity: Yes  Lifestyle  . Physical activity:    Days per week: Not on file    Minutes per session: Not on file  . Stress: Not on file  Relationships  . Social connections:    Talks on phone: Not on file    Gets together: Not on file    Attends religious service: Not on file    Active member of club or  organization: Not on file    Attends meetings of clubs or organizations: Not on file    Relationship status: Not on file  . Intimate partner violence:    Fear of current or ex partner: Not on file    Emotionally abused: Not on file    Physically abused: Not on file    Forced sexual activity: Not on file  Other Topics Concern  . Not on file  Social History Narrative  . Not on file   Family History  Problem Relation Age of Onset  . Prostate cancer Father        d.89 diagnosed in late-60s  . Colon cancer Father        diagnosed at 43  . Heart failure Mother        d.37  . Heart disease Mother   . Colon polyps Brother        polyposis. Underwent partial colectomy at age 81.  Marland Kitchen Spina bifida Brother        died at birth  . Stomach cancer Paternal Aunt   . Brain cancer Cousin 19       paternal first-cousin-once-removed  . Kidney cancer Cousin        d.78s maternal first-cousin  . Other Other 16       multiple lipomas  . Colon cancer Paternal Uncle        in his 67s  . Heart failure Maternal Grandmother        d.45  . Heart failure Maternal Uncle        d.50  . Colon polyps Son 40  . Heart attack Son 52  . Esophageal cancer Neg Hx   . Rectal cancer Neg Hx     Objective: Office vital signs reviewed. BP 133/68   Pulse 76   Temp 97.7 F (36.5 C) (Oral)   Ht 5' 1"  (1.549 m)   Wt 228 lb (103.4 kg)   BMI 43.08 kg/m   Physical Examination:  General: Awake, alert, morbid obesity, No acute distress MSK: antalgic gait. Using cane for ambulation  Right knee: Patient has limited active range of motion in extension and flexion secondary to pain.  Anatomic landmarks are obscured by adipose tissue.  She has small amounts of soft tissue swelling appreciated anteriorly.  This is actually less than compared to the left knee.  She has tenderness to palpation to the medial knee joint.  No patellar, patellar tendon, quad tendon tenderness to palpation.  No palpable posterior popliteal  masses.  No ligamentous laxity.  She has a positive Thessaly.  Left knee: Active range of motion better than in the right.  She does actually have quite a bit more soft tissue swelling on the left knee compared to the right. Skin: dry; intact; no rashes or lesions Neuro: Light touch sensation grossly intact.  JOINT INJECTION:  Patient denies allergy to antiseptics (including iodine) and anesthetics.  Patient does have a h/o diabetes. Currently on ASA54m.  No frequent steroid use, use of blood thinners.  Patient was given informed consent and a signed copy has been placed in the chart. Appropriate time out was taken. Area prepped and draped in usual sterile fashion. Anatomic landmarks were identified and injection site was marked.  Ethyl chloride spray was used to numb the area and 1 cc of methylprednisolone 40 mg/ml plus  3 cc of 1% lidocaine without epinephrine was injected into the right knee using a(n) anteriomedial approach. The patient tolerated the procedure well and there were no immediate complications. Estimated blood loss is less than 1 cc.  Post procedure instructions were reviewed and handout outlining these instructions were provided to patient.   Assessment/ Plan: 72y.o. female   1. Chronic pain of right knee Refractory to tramadol and prednisone taper last month.  She wishes to proceed with corticosteroid injection since this helped her greatly in the  past.  This was performed and patient tolerated procedure well.  No immediate complications.  Home care instructions were reviewed.  See above note for procedure.  If persistent despite current therapies, I recommended that she see orthopedics for further evaluation.  She was good understanding of follow-up as needed.   Janora Norlander, DO [place] [phone]

## 2017-12-19 NOTE — Patient Instructions (Signed)
Knee Injection, Care After  Refer to this sheet in the next few weeks. These instructions provide you with information about caring for yourself after your procedure. Your health care provider may also give you more specific instructions. Your treatment has been planned according to current medical practices, but problems sometimes occur. Call your health care provider if you have any problems or questions after your procedure.  What can I expect after the procedure?  After the procedure, it is common to have:   Soreness.   Warmth.   Swelling.    You may have more pain, swelling, and warmth than you did before the injection. This reaction may last for about one day.  Follow these instructions at home:  Bathing   If you were given a bandage (dressing), keep it dry until your health care provider says it can be removed. Ask your health care provider when you can start showering or taking a bath.  Managing pain, stiffness, and swelling   If directed, apply ice to the injection area:  ? Put ice in a plastic bag.  ? Place a towel between your skin and the bag.  ? Leave the ice on for 20 minutes, 2-3 times per day.   Do not apply heat to your knee.   Raise the injection area above the level of your heart while you are sitting or lying down.  Activity   Avoid strenuous activities for as long as directed by your health care provider. Ask your health care provider when you can return to your normal activities.  General instructions   Take medicines only as directed by your health care provider.   Do not take aspirin or other over-the-counter medicines unless your health care provider says you can.   Check your injection site every day for signs of infection. Watch for:  ? Redness, swelling, or pain.  ? Fluid, blood, or pus.   Follow your health care provider's instructions about dressing changes and removal.  Contact a health care provider if:   You have symptoms at your injection site that last longer than  two days after your procedure.   You have redness, swelling, or pain in your injection area.   You have fluid, blood, or pus coming from your injection site.   You have warmth in your injection area.   You have a fever.   Your pain is not controlled with medicine.  Get help right away if:   Your knee turns very red.   Your knee becomes very swollen.   Your knee pain is severe.  This information is not intended to replace advice given to you by your health care provider. Make sure you discuss any questions you have with your health care provider.  Document Released: 08/22/2014 Document Revised: 04/06/2016 Document Reviewed: 06/11/2014  Elsevier Interactive Patient Education  2018 [employer]

## 2017-12-22 ENCOUNTER — Telehealth: Payer: Self-pay | Admitting: Nurse Practitioner

## 2017-12-22 DIAGNOSIS — M25561 Pain in right knee: Secondary | ICD-10-CM

## 2017-12-23 NOTE — Telephone Encounter (Signed)
Spoke with pt and she states her knee is still hurting. I advised pt she could try using ice no more than 20 minutes at a time and I would send the message to Dr [name] who will be back in the office on Monday. Pt states it is a little better this morning but still not where she thought it would be after getting an injection.   Please advise.

## 2017-12-25 NOTE — Telephone Encounter (Signed)
Pt notified of recommendation Pt is in agreement Ortho referral entered in Epic

## 2017-12-25 NOTE — Telephone Encounter (Signed)
I recommend that she follow up with orthopedics if knee is still hurting since this is refractory to steroid injection.

## 2017-12-25 NOTE — Telephone Encounter (Signed)
Pt is calling back because she has not heard back about what she needs to do with her knee

## 2017-12-26 ENCOUNTER — Ambulatory Visit (INDEPENDENT_AMBULATORY_CARE_PROVIDER_SITE_OTHER): Payer: PPO | Admitting: Family Medicine

## 2017-12-26 ENCOUNTER — Ambulatory Visit (INDEPENDENT_AMBULATORY_CARE_PROVIDER_SITE_OTHER): Payer: PPO

## 2017-12-26 ENCOUNTER — Encounter: Payer: Self-pay | Admitting: Family Medicine

## 2017-12-26 VITALS — BP 115/56 | HR 60 | Temp 97.8°F | Ht 61.0 in | Wt 220.2 lb

## 2017-12-26 DIAGNOSIS — M25561 Pain in right knee: Secondary | ICD-10-CM

## 2017-12-26 DIAGNOSIS — M5442 Lumbago with sciatica, left side: Secondary | ICD-10-CM

## 2017-12-26 DIAGNOSIS — G8929 Other chronic pain: Secondary | ICD-10-CM

## 2017-12-26 DIAGNOSIS — M1711 Unilateral primary osteoarthritis, right knee: Secondary | ICD-10-CM | POA: Diagnosis not present

## 2017-12-26 MED ORDER — PREDNISONE 10 MG PO TABS: ORAL_TABLET | ORAL | 0 refills | Status: DC

## 2017-12-26 MED ORDER — TRAMADOL HCL 50 MG PO TABS: 100.0000 mg | ORAL_TABLET | Freq: Three times a day (TID) | ORAL | 0 refills | Status: DC

## 2017-12-26 NOTE — Progress Notes (Signed)
Subjective:  Patient ID: Stacey Summers, female    DOB: Apr 27, 1946  Age: 72 y.o. MRN: [id number]  CC: Knee Pain (pt here today c/o right knee pain)   HPI Ksenia Kunz Rohm presents for patient has right knee pain so severe that she cannot walk on it.  She has had multiple treatment modalities including steroid injection 1 week ago.  Prior to that she had steroid Dosepak.  She says she is taking gabapentin in the past for other problems and could not tolerate the medication.  She has not gotten relief from tramadol.  She saw orthopedics in the past for this problem and was told that she would need surgery.  However the orthopedist who told her that is now dead.  Recently she also had some low back pain radiating down the posterior leg.  It went through the posterior thigh as well and caused some pain below the knee.  She was given an injection in the back and that relieved the pain for a time however it is come back primarily in the knee now.  Her husband questions whether the source of the pain is in the knee or the back.  He went through a similar process and the pain was in fact determined to be in the back.  Patient says the pain is 8-9/10 on a 1-10 pain scale.  It hurts throughout the knee joint.  It is an intense ache.  It sometimes radiates inferiorly and proximally.  It hurts particularly when she tries to walk on it.  She is concerned that she is going to make the other knee hurt because she has to compensate so much with it.  Depression screen [facility] 2/9 12/26/2017 12/19/2017 12/09/2017  Decreased Interest 0 0 0  Down, Depressed, Hopeless 0 0 0  PHQ - 2 Score 0 0 0  Altered sleeping - - -  Tired, decreased energy - - -  Change in appetite - - -  Feeling bad or failure about yourself  - - -  Trouble concentrating - - -  Moving slowly or fidgety/restless - - -  Suicidal thoughts - - -  PHQ-9 Score - - -    History Maudene has a past medical history of Allergy, Anxiety, Arthritis, Asthma,  Cataract, Chronic back pain, COPD (chronic obstructive pulmonary disease) ([place]), Diabetes mellitus without complication ([place]), DJD (degenerative joint disease), Fibromyalgia, Genetic testing (03/08/2017), GERD (gastroesophageal reflux disease), History of abnormal mammogram, Hyperlipidemia, Hypertension, Irregular heart beat, Neuromuscular disorder ([place]), Sleep apnea, and Tubular adenoma of colon (05/09/12).   She has a past surgical history that includes Tonsillectomy (1965); Cholecystectomy (1990); Cesarean section (1977); Tumor removal; Polypectomy; Larynx surgery; Carpal tunnel release (Left, 10/20/14); and Colonoscopy.   Her family history includes Brain cancer (age of onset: 5) in her cousin; Colon cancer in her father and paternal uncle; Colon polyps in her brother; Colon polyps (age of onset: 39) in her son; Heart attack (age of onset: 73) in her son; Heart disease in her mother; Heart failure in her maternal grandmother, maternal uncle, and mother; Kidney cancer in her cousin; Other (age of onset: 27) in her other; Prostate cancer in her father; Spina bifida in her brother; Stomach cancer in her paternal aunt.She reports that she quit smoking about 48 years ago. Her smoking use included cigarettes. She has a 3.00 pack-year smoking history. She has never used smokeless tobacco. She reports that she does not drink alcohol or use drugs.    ROS Review of  Systems  Constitutional: Positive for activity change and fatigue. Negative for fever.  HENT: Negative.   Respiratory: Negative for shortness of breath.   Cardiovascular: Negative for chest pain.  Gastrointestinal: Negative for abdominal pain.  Musculoskeletal: Positive for arthralgias, back pain and myalgias.  Neurological: Positive for weakness. Negative for syncope and numbness.  Psychiatric/Behavioral: Negative.     Objective:  BP (!) 115/56   Pulse 60   Temp 97.8 F (36.6 C) (Oral)   Ht 5' 1"  (1.549 m)   Wt 220 lb 4 oz (99.9 kg)    BMI 41.62 kg/m   BP Readings from Last 3 Encounters:  12/26/17 (!) 115/56  12/19/17 133/68  12/09/17 (!) 136/56    Wt Readings from Last 3 Encounters:  12/26/17 220 lb 4 oz (99.9 kg)  12/19/17 228 lb (103.4 kg)  12/09/17 230 lb (104.3 kg)     Physical Exam  Constitutional: She is oriented to person, place, and time. She appears well-developed and well-nourished. She appears distressed.  HENT:  Head: Normocephalic and atraumatic.  Eyes: Pupils are equal, round, and reactive to light. EOM are normal.  Neck: Normal range of motion.  Pulmonary/Chest: Effort normal.  Musculoskeletal: She exhibits tenderness (There is marked tenderness to palpation at anterior joint line, posteriorly at a palpable Baker's cyst.  Distally near the ankle and for all passive range of motion). She exhibits no edema or deformity.  Patient is in wheelchair today  Neurological: She is alert and oriented to person, place, and time. Coordination normal.  Skin: Skin is warm and dry. No erythema.  Psychiatric: Her mood appears anxious. Her speech is rapid and/or pressured. She is agitated.   Right knee x-ray shows patellar spurring and narrowing of the medial joint space indicative of degenerative joint disease.   Assessment & Plan:   Nakeda was seen today for knee pain.  Diagnoses and all orders for this visit:  Acute pain of right knee -     DG Knee 1-2 Views Right; Future -     Nerve conduction test; Future -     Ambulatory referral to Orthopedics  Chronic left-sided low back pain with left-sided sciatica -     traMADol (ULTRAM) 50 MG tablet; Take 2 tablets (100 mg total) by mouth 3 (three) times daily. -     Ambulatory referral to Orthopedics  Other orders -     predniSONE (DELTASONE) 10 MG tablet; Take 5 PO x 3 days, then 4 PO x 3 days, then 3 PO x 3 days, then 2 PO x 3 days then 1 PO x 3 days.       I have changed Subrina L. Grudzien's traMADol. I am also having her start on predniSONE.  Additionally, I am having her maintain her cholecalciferol, aspirin, LIBERTY BLOOD GLUCOSE MONITOR, glucose blood, metolazone, furosemide, cyclobenzaprine, ONE TOUCH ULTRA TEST, meclizine, atorvastatin, ALPRAZolam, umeclidinium-vilanterol, amLODipine, ONETOUCH DELICA LANCETS 81W, benazepril, metFORMIN, and omeprazole.  Allergies as of 12/26/2017   No Known Allergies     Medication List        Accurate as of 12/26/17  3:40 PM. Always use your most recent med list.          ALPRAZolam 0.25 MG tablet Commonly known as:  XANAX TAKE ONE TABLET BY MOUTH TWICE DAILY AS NEEDED   amLODipine 5 MG tablet Commonly known as:  NORVASC TAKE 1 TABLET BY MOUTH ONCE DAILY   aspirin 81 MG tablet Take 81 mg by mouth as needed for pain.  atorvastatin 40 MG tablet Commonly known as:  LIPITOR TAKE 1 TABLET BY MOUTH ONCE DAILY   benazepril 40 MG tablet Commonly known as:  LOTENSIN TAKE 1 TABLET BY MOUTH ONCE DAILY   cholecalciferol 1000 units tablet Commonly known as:  VITAMIN D Take 1,000 Units by mouth daily.   cyclobenzaprine 5 MG tablet Commonly known as:  FLEXERIL TAKE 1 BY MOUTH 3 TIMES DAILY AS NEEDED FOR MUSCLE SPASMS  --MARY MARTIN NP   furosemide 40 MG tablet Commonly known as:  LASIX TAKE 1 BY MOUTH EVERY MORNING AND 1/2 BY MOUTH AT 1 IN THE AFTERNOON   glucose blood test strip Test 1x per day and prn- dx 250.02   ONE TOUCH ULTRA TEST test strip Generic drug:  glucose blood USE ONE STRIP TO CHECK GLUCOSE ONCE DAILY   LIBERTY BLOOD GLUCOSE MONITOR Devi 1 each by Does not apply route daily. Test 1x per day and prn-dx250.02   meclizine 25 MG tablet Commonly known as:  ANTIVERT Take 1 tablet (25 mg total) by mouth 3 (three) times daily as needed for dizziness.   metFORMIN 500 MG tablet Commonly known as:  GLUCOPHAGE TAKE 1 TABLET BY MOUTH TWICE DAILY WITH MEALS   metolazone 2.5 MG tablet Commonly known as:  ZAROXOLYN 1 po 2x a week as needed   omeprazole 40 MG  capsule Commonly known as:  PRILOSEC TAKE 1 CAPSULE BY MOUTH ONCE DAILY   ONETOUCH DELICA LANCETS 56P Misc USE ONE LANCET TO CHECK GLUCOSE ONCE DAILY AND AS NEEDED   predniSONE 10 MG tablet Commonly known as:  DELTASONE Take 5 PO x 3 days, then 4 PO x 3 days, then 3 PO x 3 days, then 2 PO x 3 days then 1 PO x 3 days.   traMADol 50 MG tablet Commonly known as:  ULTRAM Take 2 tablets (100 mg total) by mouth 3 (three) times daily.   umeclidinium-vilanterol 62.5-25 MCG/INH Aepb Commonly known as:  ANORO ELLIPTA Inhale 1 puff into the lungs daily.        Follow-up: No follow-ups on file.  Claretta Fraise, M.D.

## 2017-12-28 ENCOUNTER — Other Ambulatory Visit: Payer: Self-pay | Admitting: Specialist

## 2017-12-28 DIAGNOSIS — M5136 Other intervertebral disc degeneration, lumbar region: Secondary | ICD-10-CM | POA: Insufficient documentation

## 2017-12-28 DIAGNOSIS — M5416 Radiculopathy, lumbar region: Secondary | ICD-10-CM | POA: Diagnosis not present

## 2017-12-28 DIAGNOSIS — M51369 Other intervertebral disc degeneration, lumbar region without mention of lumbar back pain or lower extremity pain: Secondary | ICD-10-CM | POA: Insufficient documentation

## 2017-12-28 DIAGNOSIS — S83241A Other tear of medial meniscus, current injury, right knee, initial encounter: Secondary | ICD-10-CM

## 2017-12-28 DIAGNOSIS — I739 Peripheral vascular disease, unspecified: Secondary | ICD-10-CM | POA: Diagnosis not present

## 2017-12-28 DIAGNOSIS — M4310 Spondylolisthesis, site unspecified: Secondary | ICD-10-CM | POA: Insufficient documentation

## 2017-12-28 DIAGNOSIS — M25561 Pain in right knee: Secondary | ICD-10-CM | POA: Diagnosis not present

## 2017-12-28 LAB — HM DIABETES EYE EXAM

## 2018-01-01 ENCOUNTER — Other Ambulatory Visit: Payer: Self-pay | Admitting: Nurse Practitioner

## 2018-01-01 DIAGNOSIS — E782 Mixed hyperlipidemia: Secondary | ICD-10-CM

## 2018-01-02 NOTE — Telephone Encounter (Signed)
Last refill without being seen 

## 2018-01-13 DIAGNOSIS — M25561 Pain in right knee: Secondary | ICD-10-CM | POA: Diagnosis not present

## 2018-01-16 DIAGNOSIS — I739 Peripheral vascular disease, unspecified: Secondary | ICD-10-CM | POA: Diagnosis not present

## 2018-01-16 DIAGNOSIS — M4310 Spondylolisthesis, site unspecified: Secondary | ICD-10-CM | POA: Diagnosis not present

## 2018-01-16 DIAGNOSIS — E119 Type 2 diabetes mellitus without complications: Secondary | ICD-10-CM | POA: Diagnosis not present

## 2018-01-16 DIAGNOSIS — S82101A Unspecified fracture of upper end of right tibia, initial encounter for closed fracture: Secondary | ICD-10-CM | POA: Diagnosis not present

## 2018-01-20 ENCOUNTER — Other Ambulatory Visit: Payer: Self-pay | Admitting: Nurse Practitioner

## 2018-01-20 DIAGNOSIS — G8929 Other chronic pain: Secondary | ICD-10-CM

## 2018-01-20 DIAGNOSIS — M5442 Lumbago with sciatica, left side: Principal | ICD-10-CM

## 2018-01-29 ENCOUNTER — Telehealth: Payer: Self-pay | Admitting: Nurse Practitioner

## 2018-01-29 NOTE — Telephone Encounter (Signed)
pt aware DEXA was done 06/2017.

## 2018-01-31 DIAGNOSIS — S82144S Nondisplaced bicondylar fracture of right tibia, sequela: Secondary | ICD-10-CM | POA: Diagnosis not present

## 2018-01-31 DIAGNOSIS — M1711 Unilateral primary osteoarthritis, right knee: Secondary | ICD-10-CM | POA: Diagnosis not present

## 2018-01-31 DIAGNOSIS — S83241A Other tear of medial meniscus, current injury, right knee, initial encounter: Secondary | ICD-10-CM | POA: Diagnosis not present

## 2018-01-31 DIAGNOSIS — S82141A Displaced bicondylar fracture of right tibia, initial encounter for closed fracture: Secondary | ICD-10-CM | POA: Insufficient documentation

## 2018-01-31 HISTORY — DX: Displaced bicondylar fracture of right tibia, initial encounter for closed fracture: S82.141A

## 2018-02-02 DIAGNOSIS — S83241D Other tear of medial meniscus, current injury, right knee, subsequent encounter: Secondary | ICD-10-CM | POA: Diagnosis not present

## 2018-02-02 DIAGNOSIS — S82144D Nondisplaced bicondylar fracture of right tibia, subsequent encounter for closed fracture with routine healing: Secondary | ICD-10-CM | POA: Diagnosis not present

## 2018-02-06 ENCOUNTER — Encounter: Payer: Self-pay | Admitting: Nurse Practitioner

## 2018-02-06 ENCOUNTER — Ambulatory Visit (INDEPENDENT_AMBULATORY_CARE_PROVIDER_SITE_OTHER): Payer: PPO

## 2018-02-06 ENCOUNTER — Ambulatory Visit (INDEPENDENT_AMBULATORY_CARE_PROVIDER_SITE_OTHER): Payer: PPO | Admitting: Nurse Practitioner

## 2018-02-06 VITALS — BP 87/54 | HR 75 | Temp 96.7°F | Ht 61.0 in | Wt 216.0 lb

## 2018-02-06 DIAGNOSIS — I10 Essential (primary) hypertension: Secondary | ICD-10-CM

## 2018-02-06 DIAGNOSIS — E785 Hyperlipidemia, unspecified: Secondary | ICD-10-CM

## 2018-02-06 DIAGNOSIS — K219 Gastro-esophageal reflux disease without esophagitis: Secondary | ICD-10-CM | POA: Diagnosis not present

## 2018-02-06 DIAGNOSIS — J410 Simple chronic bronchitis: Secondary | ICD-10-CM | POA: Diagnosis not present

## 2018-02-06 DIAGNOSIS — M8580 Other specified disorders of bone density and structure, unspecified site: Secondary | ICD-10-CM

## 2018-02-06 DIAGNOSIS — F411 Generalized anxiety disorder: Secondary | ICD-10-CM | POA: Diagnosis not present

## 2018-02-06 DIAGNOSIS — E119 Type 2 diabetes mellitus without complications: Secondary | ICD-10-CM | POA: Diagnosis not present

## 2018-02-06 DIAGNOSIS — M797 Fibromyalgia: Secondary | ICD-10-CM

## 2018-02-06 DIAGNOSIS — R600 Localized edema: Secondary | ICD-10-CM

## 2018-02-06 DIAGNOSIS — G4733 Obstructive sleep apnea (adult) (pediatric): Secondary | ICD-10-CM

## 2018-02-06 DIAGNOSIS — M25561 Pain in right knee: Secondary | ICD-10-CM | POA: Diagnosis not present

## 2018-02-06 DIAGNOSIS — J984 Other disorders of lung: Secondary | ICD-10-CM

## 2018-02-06 DIAGNOSIS — E782 Mixed hyperlipidemia: Secondary | ICD-10-CM

## 2018-02-06 DIAGNOSIS — E669 Obesity, unspecified: Secondary | ICD-10-CM

## 2018-02-06 DIAGNOSIS — R5383 Other fatigue: Secondary | ICD-10-CM

## 2018-02-06 DIAGNOSIS — G8929 Other chronic pain: Secondary | ICD-10-CM

## 2018-02-06 LAB — BAYER DCA HB A1C WAIVED: HB A1C (BAYER DCA - WAIVED): 6.2 % (ref <7.0)

## 2018-02-06 MED ORDER — OMEPRAZOLE 40 MG PO CPDR: 40.0000 mg | DELAYED_RELEASE_CAPSULE | Freq: Every day | ORAL | 1 refills | Status: DC

## 2018-02-06 MED ORDER — FUROSEMIDE 40 MG PO TABS: ORAL_TABLET | ORAL | 1 refills | Status: DC

## 2018-02-06 MED ORDER — AMLODIPINE BESYLATE 5 MG PO TABS: 5.0000 mg | ORAL_TABLET | Freq: Every day | ORAL | 1 refills | Status: DC

## 2018-02-06 MED ORDER — METFORMIN HCL 500 MG PO TABS: 500.0000 mg | ORAL_TABLET | Freq: Two times a day (BID) | ORAL | 1 refills | Status: DC

## 2018-02-06 MED ORDER — ATORVASTATIN CALCIUM 40 MG PO TABS: 40.0000 mg | ORAL_TABLET | Freq: Every day | ORAL | 1 refills | Status: DC

## 2018-02-06 MED ORDER — BENAZEPRIL HCL 40 MG PO TABS: 40.0000 mg | ORAL_TABLET | Freq: Every day | ORAL | 1 refills | Status: DC

## 2018-02-06 MED ORDER — ALPRAZOLAM 0.25 MG PO TABS: 0.2500 mg | ORAL_TABLET | Freq: Two times a day (BID) | ORAL | 1 refills | Status: DC | PRN

## 2018-02-06 NOTE — Patient Instructions (Signed)

## 2018-02-06 NOTE — Progress Notes (Signed)
 Subjective:    Patient ID: Stacey Summers, female    DOB: 08/28/1945, 71 y.o.   MRN: [id number]   Chief Complaint: Medical management of chronic issues  HPI:  1. Essential hypertension  No c/o chest pain, sob or headache. Does not check blood pressure at home. BP Readings from Last 3 Encounters:  02/06/18 (!) 87/54  12/26/17 (!) 115/56  12/19/17 133/68     2. Type 2 diabetes mellitus without complication, without long-term current use of insulin (HCC) hgba1c 6.4%. She says blood sugars have been runniing below 130. No hypoglycemia.  3. Hyperlipidemia, unspecified hyperlipidemia type  Does not watch diet at all.  4. Restrictive lung disease secondary to obesity  She has dyspnea and she has been told that it is due to her weight.  5. OSA treated with BiPAP  Wears bipap nightly  6. Simple chronic bronchitis (HCC)  Stays sob most of time. Denies cough  7. Gastroesophageal reflux disease without esophagitis  takes omeprazole daily for symptoms.  8. Osteopenia, unspecified location  Last BMD test was 06/26/17 with tscore of -1.2. Is taking vitamin d and calcium supplement daily.  9. GAD (generalized anxiety disorder)  Has xanax to take as needed  10. Fibromyalgia muscle pain  Has constant pain which she says worsens when she is up walking and standing for long periods of time.   11. Other fatigue  She stays fatiggied  12. Chronic pain of right knee  Is taking ultram and ortho gave it to her this last time. It helps but never completely relieves her pain. She is going to have knee urgery after she gets surgical clearance.    Outpatient Encounter Medications as of 02/06/2018  Medication Sig  . ALPRAZolam (XANAX) 0.25 MG tablet TAKE ONE TABLET BY MOUTH TWICE DAILY AS NEEDED  . amLODipine (NORVASC) 5 MG tablet TAKE 1 TABLET BY MOUTH ONCE DAILY  . aspirin 81 MG tablet Take 81 mg by mouth as needed for pain.  . atorvastatin (LIPITOR) 40 MG tablet TAKE 1 TABLET BY MOUTH ONCE DAILY    . benazepril (LOTENSIN) 40 MG tablet TAKE 1 TABLET BY MOUTH ONCE DAILY  . Blood Glucose Monitoring Suppl (LIBERTY BLOOD GLUCOSE MONITOR) DEVI 1 each by Does not apply route daily. Test 1x per day and prn-dx250.02  . cholecalciferol (VITAMIN D) 1000 UNITS tablet Take 1,000 Units by mouth daily.   . cyclobenzaprine (FLEXERIL) 5 MG tablet TAKE 1 TABLET BY MOUTH THREE TIMES DAILY AS NEEDED FOR MUSCLE SPASMS (NEEDS APPOINTMENT)  . furosemide (LASIX) 40 MG tablet TAKE 1 BY MOUTH EVERY MORNING AND 1/2 BY MOUTH AT 1 IN THE AFTERNOON  . glucose blood test strip Test 1x per day and prn- dx 250.02  . meclizine (ANTIVERT) 25 MG tablet Take 1 tablet (25 mg total) by mouth 3 (three) times daily as needed for dizziness.  . metFORMIN (GLUCOPHAGE) 500 MG tablet TAKE 1 TABLET BY MOUTH TWICE DAILY WITH MEALS  . metolazone (ZAROXOLYN) 2.5 MG tablet 1 po 2x a week as needed  . omeprazole (PRILOSEC) 40 MG capsule TAKE 1 CAPSULE BY MOUTH ONCE DAILY  . ONE TOUCH ULTRA TEST test strip USE ONE STRIP TO CHECK GLUCOSE ONCE DAILY  . ONETOUCH DELICA LANCETS 33G MISC USE ONE LANCET TO CHECK GLUCOSE ONCE DAILY AND AS NEEDED  . predniSONE (DELTASONE) 10 MG tablet Take 5 PO x 3 days, then 4 PO x 3 days, then 3 PO x 3 days, then 2 PO x 3   days then 1 PO x 3 days.  . traMADol (ULTRAM) 50 MG tablet Take 2 tablets (100 mg total) by mouth 3 (three) times daily.  Marland Kitchen umeclidinium-vilanterol (ANORO ELLIPTA) 62.5-25 MCG/INH AEPB Inhale 1 puff into the lungs daily.       New complaints: None today  Social history: Lives with her husband and her son lives in basement   Review of Systems  Constitutional: Positive for activity change (decreased due to right knee pain).  Respiratory: Positive for shortness of breath.   Cardiovascular: Positive for leg swelling. Negative for chest pain and palpitations.  Genitourinary: Negative.   Musculoskeletal: Positive for arthralgias (right knee) and myalgias (all over).  Neurological:  Negative.   Psychiatric/Behavioral: Negative.        Objective:   Physical Exam  Constitutional: She is oriented to person, place, and time. She appears well-developed and well-nourished.  HENT:  Head: Normocephalic.  Nose: Nose normal.  Mouth/Throat: Oropharynx is clear and moist.  Eyes: Pupils are equal, round, and reactive to light. EOM are normal.  Neck: Normal range of motion. Neck supple. No JVD present. Carotid bruit is not present.  Cardiovascular: Normal rate, regular rhythm, normal heart sounds and intact distal pulses.  Pulmonary/Chest: Effort normal and breath sounds normal. No respiratory distress. She has no wheezes. She has no rales. She exhibits no tenderness.  Abdominal: Soft. Normal appearance, normal aorta and bowel sounds are normal. She exhibits no distension, no abdominal bruit, no pulsatile midline mass and no mass. There is no splenomegaly or hepatomegaly. There is no tenderness.  Musculoskeletal: Normal range of motion. She exhibits edema (1+ edema bil lower ext).  Decrease ROM of right knee due to pan on flexion and extension Mild effusion  Lymphadenopathy:    She has no cervical adenopathy.  Neurological: She is alert and oriented to person, place, and time. She has normal reflexes.  Skin: Skin is warm and dry.  Psychiatric: She has a normal mood and affect. Her behavior is normal. Judgment and thought content normal.  Nursing note and vitals reviewed.     BP (!) 87/54   Pulse 75   Temp (!) 96.7 F (35.9 C) (Oral)   Ht 5' 1" (1.549 m)   Wt 216 lb (98 kg)   BMI 40.81 kg/m   Chest xray- normal-Preliminary reading by Ronnald Collum, FNP  [facility]  ekg- NSR-Stacey Summers Done, FNP     Assessment & Plan:  Stacey Summers comes in today with chief complaint of No chief complaint on file.   Diagnosis and orders addressed:  1. Essential hypertension Low sodium diet - CMP14+EGFR - EKG 12-Lead - DG Chest 2 View; Future - benazepril (LOTENSIN) 40 MG  tablet; Take 1 tablet (40 mg total) by mouth daily.  Dispense: 90 tablet; Refill: 1 - amLODipine (NORVASC) 5 MG tablet; Take 1 tablet (5 mg total) by mouth daily.  Dispense: 180 tablet; Refill: 1  2. Type 2 diabetes mellitus without complication, without long-term current use of insulin (HCC) continue to watch carbs in diet - Bayer DCA Hb A1c Waived - Microalbumin / creatinine urine ratio - metFORMIN (GLUCOPHAGE) 500 MG tablet; Take 1 tablet (500 mg total) by mouth 2 (two) times daily with a meal.  Dispense: 180 tablet; Refill: 1  3. Hyperlipidemia, unspecified hyperlipidemia type Low fat diet - Lipid panel  4. Restrictive lung disease secondary to obesity Encouraged weight loss  5. OSA treated with BiPAP Continue to ear bipap at night  6. Simple chronic bronchitis (  HCC)  7. Gastroesophageal reflux disease without esophagitis Avoid spicy foods Do not eat 2 hours prior to bedtime - omeprazole (PRILOSEC) 40 MG capsule; Take 1 capsule (40 mg total) by mouth daily.  Dispense: 90 capsule; Refill: 1  8. Osteopenia, unspecified location Weight bearing exercises when able  9. GAD (generalized anxiety disorder) Stress management - ALPRAZolam (XANAX) 0.25 MG tablet; Take 1 tablet (0.25 mg total) by mouth 2 (two) times daily as needed.  Dispense: 30 tablet; Refill: 1  10. Fibromyalgia muscle pain Keep moving to keep muscles warm- will keep pain down  11. Other fatigue  12. Chronic pain of right knee Cleared for surgery  13. Bilateral leg edema Continue lasix and zaroxlyn as needed - furosemide (LASIX) 40 MG tablet; TAKE 1 BY MOUTH EVERY MORNING AND 1/2 BY MOUTH AT 1 IN THE AFTERNOON  Dispense: 135 tablet; Refill: 1  14. Mixed hyperlipidemia Low fat diet - atorvastatin (LIPITOR) 40 MG tablet; Take 1 tablet (40 mg total) by mouth daily.  Dispense: 90 tablet; Refill: 1   Labs pending Health Maintenance reviewed Diet and exercise encouraged  Follow up plan: 3  months   Stacey , FNP   

## 2018-02-07 LAB — LIPID PANEL
CHOLESTEROL TOTAL: 103 mg/dL (ref 100–199)
Chol/HDL Ratio: 3.2 ratio (ref 0.0–4.4)
HDL: 32 mg/dL — ABNORMAL LOW (ref >39)
LDL Calculated: 54 mg/dL (ref 0–99)
TRIGLYCERIDES: 83 mg/dL (ref 0–149)
VLDL CHOLESTEROL CAL: 17 mg/dL (ref 5–40)

## 2018-02-07 LAB — CMP14+EGFR
ALK PHOS: 78 IU/L (ref 39–117)
ALT: 23 IU/L (ref 0–32)
AST: 38 IU/L (ref 0–40)
Albumin/Globulin Ratio: 1.6 (ref 1.2–2.2)
Albumin: 3.6 g/dL (ref 3.6–4.8)
BILIRUBIN TOTAL: 2.9 mg/dL — AB (ref 0.0–1.2)
BUN/Creatinine Ratio: 23 (ref 12–28)
BUN: 20 mg/dL (ref 8–27)
CALCIUM: 9.2 mg/dL (ref 8.7–10.3)
CHLORIDE: 102 mmol/L (ref 96–106)
CO2: 28 mmol/L (ref 20–29)
Creatinine, Ser: 0.88 mg/dL (ref 0.57–1.00)
GFR calc Af Amer: 78 mL/min/{1.73_m2} (ref >59)
GFR calc non Af Amer: 67 mL/min/{1.73_m2} (ref >59)
GLUCOSE: 107 mg/dL — AB (ref 65–99)
Globulin, Total: 2.2 g/dL (ref 1.5–4.5)
Potassium: 4.2 mmol/L (ref 3.5–5.2)
Sodium: 146 mmol/L — ABNORMAL HIGH (ref 134–144)
TOTAL PROTEIN: 5.8 g/dL — AB (ref 6.0–8.5)

## 2018-03-13 DIAGNOSIS — G4733 Obstructive sleep apnea (adult) (pediatric): Secondary | ICD-10-CM | POA: Diagnosis not present

## 2018-03-21 ENCOUNTER — Other Ambulatory Visit (HOSPITAL_COMMUNITY): Payer: PPO

## 2018-03-21 NOTE — Pre-Procedure Instructions (Addendum)
Stacey Summers  03/21/2018      PRIMEMAIL (MAIL ORDER) ELECTRONIC - [place], [place] - [place] [address] [place] [address] Phone: [phone] Fax: [place] [address] [place] - [place] [address] [place] [address] Phone: [phone] Fax: [phone]    Your procedure is scheduled on Tuesday August 13.  Report to [name] Admitting at 11:00 A.M.  Call this number if you have problems the morning of surgery:  [phone]   Remember:  Do not eat or drink after midnight.    Take these medicines the morning of surgery with A SIP OF WATER:   Amlodipine (norvasc) Omeprazole (prilosec) Flexeril if needed Xanax if needed Meclizine if needed Tramadol if needed  DO NOT TAKE Metformin (Glucophage) the day of surgery  7 days prior to surgery STOP taking any Aleve, Naproxen, Ibuprofen, Motrin, Advil, Goody's, BC's, all herbal medications, fish oil, and all vitamins  FOLLOW your surgeon's instructions on stopping Aspirin.     How to Manage Your Diabetes Before and After Surgery  Why is it important to control my blood sugar before and after surgery? . Improving blood sugar levels before and after surgery helps healing and can limit problems. . A way of improving blood sugar control is eating a healthy diet by: o  Eating less sugar and carbohydrates o  Increasing activity/exercise o  Talking with your doctor about reaching your blood sugar goals . High blood sugars (greater than 180 mg/dL) can raise your risk of infections and slow your recovery, so you will need to focus on controlling your diabetes during the weeks before surgery. . Make sure that the doctor who takes care of your diabetes knows about your planned surgery including the date and location.  How do I manage my blood sugar before surgery? . Check your blood sugar at least 4 times a day, starting 2 days before surgery,  to make sure that the level is not too high or low. o Check your blood sugar the morning of your surgery when you wake up and every 2 hours until you get to the Short Stay unit. . If your blood sugar is less than 70 mg/dL, you will need to treat for low blood sugar: o Do not take insulin. o Treat a low blood sugar (less than 70 mg/dL) with  cup of clear juice (cranberry or apple), 4 glucose tablets, OR glucose gel. Recheck blood sugar in 15 minutes after treatment (to make sure it is greater than 70 mg/dL). If your blood sugar is not greater than 70 mg/dL on recheck, call [phone] o  for further instructions. . Report your blood sugar to the short stay nurse when you get to Short Stay.  . If you are admitted to the hospital after surgery: o Your blood sugar will be checked by the staff and you will probably be given insulin after surgery (instead of oral diabetes medicines) to make sure you have good blood sugar levels. o The goal for blood sugar control after surgery is 80-180 mg/dL.              Do not wear jewelry, make-up or nail polish.  Do not wear lotions, powders, or perfumes, or deodorant.  Do not shave 48 hours prior to surgery.  Men may shave face and neck.  Do not bring valuables to the hospital.  [facility] is not responsible for any belongings  or valuables.  Contacts, dentures or bridgework may not be worn into surgery.  Leave your suitcase in the car.  After surgery it may be brought to your room.  For patients admitted to the hospital, discharge time will be determined by your treatment team.  Patients discharged the day of surgery will not be allowed to drive home.   Special instructions:    [place]- Preparing For Surgery  Before surgery, you can play an important role. Because skin is not sterile, your skin needs to be as free of germs as possible. You can reduce the number of germs on your skin by washing with CHG (chlorahexidine gluconate) Soap  before surgery.  CHG is an antiseptic cleaner which kills germs and bonds with the skin to continue killing germs even after washing.    Oral Hygiene is also important to reduce your risk of infection.  Remember - BRUSH YOUR TEETH THE MORNING OF SURGERY WITH YOUR REGULAR TOOTHPASTE  Please do not use if you have an allergy to CHG or antibacterial soaps. If your skin becomes reddened/irritated stop using the CHG.  Do not shave (including legs and underarms) for at least 48 hours prior to first CHG shower. It is OK to shave your face.  Please follow these instructions carefully.   1. Shower the NIGHT BEFORE SURGERY and the MORNING OF SURGERY with CHG.   2. If you chose to wash your hair, wash your hair first as usual with your normal shampoo.  3. After you shampoo, rinse your hair and body thoroughly to remove the shampoo.  4. Use CHG as you would any other liquid soap. You can apply CHG directly to the skin and wash gently with a scrungie or a clean washcloth.   5. Apply the CHG Soap to your body ONLY FROM THE NECK DOWN.  Do not use on open wounds or open sores. Avoid contact with your eyes, ears, mouth and genitals (private parts). Wash Face and genitals (private parts)  with your normal soap.  6. Wash thoroughly, paying special attention to the area where your surgery will be performed.  7. Thoroughly rinse your body with warm water from the neck down.  8. DO NOT shower/wash with your normal soap after using and rinsing off the CHG Soap.  9. Pat yourself dry with a CLEAN TOWEL.  10. Wear CLEAN PAJAMAS to bed the night before surgery, wear comfortable clothes the morning of surgery  11. Place CLEAN SHEETS on your bed the night of your first shower and DO NOT SLEEP WITH PETS.    Day of Surgery:  Do not apply any deodorants/lotions.  Please wear clean clothes to the hospital/surgery center.   Remember to brush your teeth WITH YOUR REGULAR TOOTHPASTE.    Please read over the  following fact sheets that you were given. Coughing and Deep Breathing and Surgical Site Infection Prevention

## 2018-03-22 ENCOUNTER — Other Ambulatory Visit: Payer: Self-pay

## 2018-03-22 ENCOUNTER — Encounter (HOSPITAL_COMMUNITY): Payer: Self-pay

## 2018-03-22 ENCOUNTER — Encounter (HOSPITAL_COMMUNITY)
Admission: RE | Admit: 2018-03-22 | Discharge: 2018-03-22 | Disposition: A | Discharge status: Home / Self Care | Payer: PPO | Source: Ambulatory Visit | Attending: Orthopedic Surgery | Admitting: Orthopedic Surgery

## 2018-03-22 DIAGNOSIS — Z01812 Encounter for preprocedural laboratory examination: Secondary | ICD-10-CM | POA: Diagnosis not present

## 2018-03-22 LAB — BASIC METABOLIC PANEL
Anion gap: 8 (ref 5–15)
BUN: 17 mg/dL (ref 8–23)
CHLORIDE: 110 mmol/L (ref 98–111)
CO2: 24 mmol/L (ref 22–32)
Calcium: 8.5 mg/dL — ABNORMAL LOW (ref 8.9–10.3)
Creatinine, Ser: 0.7 mg/dL (ref 0.44–1.00)
GFR calc non Af Amer: >60 mL/min (ref >60)
Glucose, Bld: 227 mg/dL — ABNORMAL HIGH (ref 70–99)
POTASSIUM: 4.1 mmol/L (ref 3.5–5.1)
SODIUM: 142 mmol/L (ref 135–145)

## 2018-03-22 LAB — CBC
HEMATOCRIT: 39.6 % (ref 36.0–46.0)
Hemoglobin: 12.8 g/dL (ref 12.0–15.0)
MCH: 32.8 pg (ref 26.0–34.0)
MCHC: 32.3 g/dL (ref 30.0–36.0)
MCV: 101.5 fL — AB (ref 78.0–100.0)
PLATELETS: 84 10*3/uL — AB (ref 150–400)
RBC: 3.9 MIL/uL (ref 3.87–5.11)
RDW: 13.2 % (ref 11.5–15.5)
WBC: 3.8 10*3/uL — AB (ref 4.0–10.5)

## 2018-03-22 LAB — GLUCOSE, CAPILLARY: GLUCOSE-CAPILLARY: 212 mg/dL — AB (ref 70–99)

## 2018-03-22 NOTE — Progress Notes (Addendum)
PCP: Mary-Margaret, FNP  Cardiologist: Sanda Klein, MD  EKG: 02/06/18 in [place] test: denies  ECHO: 03/05/13 in [facility]  Cardiac Cath: denies  Chest x-ray: 02/07/18

## 2018-03-23 ENCOUNTER — Telehealth: Payer: Self-pay | Admitting: *Deleted

## 2018-03-23 ENCOUNTER — Other Ambulatory Visit: Payer: Self-pay | Admitting: Pediatrics

## 2018-03-23 DIAGNOSIS — D696 Thrombocytopenia, unspecified: Secondary | ICD-10-CM

## 2018-03-23 NOTE — Telephone Encounter (Signed)
Patient aware that referral has been placed to hematologly due to low platelets and will need to be worked up through hematology before surgery.  Recommendations also sent to [facility] at [phone]

## 2018-03-23 NOTE — Telephone Encounter (Signed)
Left message for patient to call back.  Referral has been placed to hematology due to low platelets.  Patient will need to see hematology before having surgery per Dr. Evette Doffing

## 2018-03-23 NOTE — Progress Notes (Signed)
Anesthesia Chart Review:   Case:  [id number] Date/Time:  03/27/18 1245   Procedure:  Right knee arthroscopic partial medial meniscus repair versus menisectomy with medial tibia subchondroplasty (Right ) - 120 mins   Anesthesia type:  Choice   Pre-op diagnosis:  Right knee medial meniscus tear and medial tibia plateau fracture   Location:  [facility] OR ROOM 07 / [place] [facility]   Surgeon:  Nicholes Stairs, MD      DISCUSSION: - Pt is a 71 year old female with hx HTN, DM, OSA, PVCs/PACs  - Platelets 84.  I do not see an established cause or a prior work up. PCP is in [place]. Prior comparison labs are from 2010 at time of cardiac cath (pre-cath, platelets 115; post-cath platelets 99), and from PCP office visit 04/02/13 (platelets 102).  I notified Margarita Grizzle in Dr. Stann Mainland office thrombocytopenia will need to be evaluated prior to surgery.    VS: BP 134/60   Pulse 70   Temp 36.6 C   Resp 20   Ht 5' 1.5" (1.562 m)   Wt 102.4 kg   SpO2 100%   BMI 41.96 kg/m    PROVIDERS: - PCP is Chevis Pretty, FNP. Last office visit 02/06/18 - Used to see cardiologist Sanda Klein, MD for obesity, HTN, OSA.  Last office visit 01/14/16; 1 year f/u recommended but has not happened.    LABS:  - Platelets 84. No recent comparison labs. No  - HbA1c 02/06/18 was 6.2   (all labs ordered are listed, but only abnormal results are displayed)  Labs Reviewed  GLUCOSE, CAPILLARY - Abnormal; Notable for the following components:      Result Value   Glucose-Capillary 212 (*)    All other components within normal limits  BASIC METABOLIC PANEL - Abnormal; Notable for the following components:   Glucose, Bld 227 (*)    Calcium 8.5 (*)    All other components within normal limits  CBC - Abnormal; Notable for the following components:   WBC 3.8 (*)    MCV 101.5 (*)    Platelets 84 (*)    All other components within normal limits     IMAGES:  CXR 02/07/18: No active cardiopulmonary disease.   EKG 02/06/18: Sinus   Rhythm  - occasional ectopic ventricular beat. Old anterior infarct.    CV:  Echo 03/04/13:  - Left ventricle: The cavity size was normal. Systolic function was normal. The estimated ejection fraction wasin the range of 55% to 60%. Wall motion was normal; therewere no regional wall motion abnormalities. Leftventricular diastolic function parameters were normal. - Left atrium: The atrium was mildly dilated.  Cardiac cath 10/31/08:  1. Mild nonobstructive branch vessel CAD (ostial D1 60%, proximal D1 50%) 2. Normal left ventricular function. 3. Frequent premature atrial contractions/premature ventricular contractions.   Past Medical History:  Diagnosis Date  . Allergy    seasonal  . Anxiety   . Arthritis    hands, back   . Asthma    bronchitis  . Cataract    removed both eyes  . Chronic back pain    lower back   . COPD (chronic obstructive pulmonary disease) ([place])   . Diabetes mellitus without complication ([place])   . DJD (degenerative joint disease)   . Fibromyalgia   . Genetic testing 03/08/2017   Ms. Luzier underwent genetic counseling and testing for hereditary cancer syndromes on 02/21/2017. Her results were negative for mutations in all 83 genes analyzed by [facility]'s 83-gene Multi-Cancers  Panel. Genes analyzed include: ALK, APC, ATM, AXIN2, BAP1, BARD1, BLM, BMPR1A, BRCA1, BRCA2, BRIP1, CASR, CDC73, CDH1, CDK4, CDKN1B, CDKN1C, CDKN2A, CEBPA, CHEK2, CTNNA1, DICER1, DIS3L2, EGFR, EPCAM  . GERD (gastroesophageal reflux disease)   . History of abnormal mammogram   . Hyperlipidemia   . Hypertension   . Irregular heart beat   . Neuromuscular disorder (HCC)    fibromyalgia  . Sleep apnea    cpap some  . Tubular adenoma of colon 05/09/12   "innumerable number" of flat polyps    Past Surgical History:  Procedure Laterality Date  . CARPAL TUNNEL RELEASE Left 10/20/14   [facility]  . [place]  . CHOLECYSTECTOMY  1990  . COLONOSCOPY    . LARYNX SURGERY      POLYP REMOVAL  . POLYPECTOMY     vocal cord  . TONSILLECTOMY  1965  . TUMOR REMOVAL     benign fatty tumors on abdomen    MEDICATIONS: . ALPRAZolam (XANAX) 0.25 MG tablet  . amLODipine (NORVASC) 5 MG tablet  . aspirin 81 MG tablet  . atorvastatin (LIPITOR) 40 MG tablet  . benazepril (LOTENSIN) 40 MG tablet  . Blood Glucose Monitoring Suppl (LIBERTY BLOOD GLUCOSE MONITOR) DEVI  . Carboxymethylcellul-Glycerin (LUBRICATING EYE DROPS OP)  . Cholecalciferol (VITAMIN D3) 5000 units CAPS  . cyclobenzaprine (FLEXERIL) 5 MG tablet  . furosemide (LASIX) 40 MG tablet  . glucose blood test strip  . meclizine (ANTIVERT) 25 MG tablet  . metFORMIN (GLUCOPHAGE) 500 MG tablet  . omeprazole (PRILOSEC) 40 MG capsule  . ONE TOUCH ULTRA TEST test strip  . ONETOUCH DELICA LANCETS 64H MISC  . traMADol (ULTRAM) 50 MG tablet  . umeclidinium-vilanterol (ANORO ELLIPTA) 62.5-25 MCG/INH AEPB   No current facility-administered medications for this encounter.     Willeen Cass, FNP-BC [facility] Short Stay Surgical Center/Anesthesiology Phone: [phone] 03/23/2018 11:54 AM

## 2018-03-26 ENCOUNTER — Telehealth: Payer: Self-pay | Admitting: Nurse Practitioner

## 2018-03-26 MED ORDER — CEFAZOLIN SODIUM-DEXTROSE 2-4 GM/100ML-% IV SOLN
2.0000 g | INTRAVENOUS | Status: DC
  Filled 2018-03-26: qty 100

## 2018-03-26 NOTE — Telephone Encounter (Signed)
Pt states she has already talked to anesthesia and is going to have surgery in the morning.

## 2018-03-26 NOTE — Anesthesia Preprocedure Evaluation (Addendum)
Anesthesia Evaluation  Patient identified by MRN, date of birth, ID band Patient awake    Reviewed: Allergy & Precautions, NPO status , Patient's Chart, lab work & pertinent test results  Airway Mallampati: I  TM Distance: >3 FB Neck ROM: Full    Dental  (+) Edentulous Upper Lower teeth intact:   Pulmonary asthma , sleep apnea and Continuous Positive Airway Pressure Ventilation , COPD, former smoker,    breath sounds clear to auscultation       Cardiovascular hypertension,  Rhythm:Regular Rate:Normal     Neuro/Psych Anxiety negative neurological ROS     GI/Hepatic Neg liver ROS, GERD  Medicated,  Endo/Other  diabetes, Type 2, Oral Hypoglycemic Agents  Renal/GU negative Renal ROS  negative genitourinary   Musculoskeletal  (+) Arthritis , Fibromyalgia -  Abdominal   Peds negative pediatric ROS (+)  Hematology negative hematology ROS (+) Thrombocytopenia plt 84, pt unaware. Has appt for hematologist on 04/02/18. Endorses easy bruising   Anesthesia Other Findings   Reproductive/Obstetrics negative OB ROS                           Anesthesia Physical Anesthesia Plan  ASA: III  Anesthesia Plan: General   Post-op Pain Management:    Induction:   PONV Risk Score and Plan: 3 and Ondansetron, Dexamethasone and Propofol infusion  Airway Management Planned: Oral ETT and LMA  Additional Equipment:   Intra-op Plan:   Post-operative Plan: Extubation in OR  Informed Consent: I have reviewed the patients History and Physical, chart, labs and discussed the procedure including the risks, benefits and alternatives for the proposed anesthesia with the patient or authorized representative who has indicated his/her understanding and acceptance.   Dental advisory given  Plan Discussed with:   Anesthesia Plan Comments: (See PAT note by Willeen Cass, NP with my follow-up note. Myra Gianotti, PA-C)       Anesthesia Quick Evaluation

## 2018-03-26 NOTE — Progress Notes (Signed)
Anesthesia follow-up: See previous note by Willeen Cass, NP from encounter 03/22/18. Patient was noted to have thrombocytopenia with platelet count of 84K. There are no recent comparison labs, but also had thrombocytopenia in 2010 snd 2014. AST/ALT 38/23 on 02/06/18. Total bilirubin 2.9, but appears chronically elevated previously 1.4-2.9 since 09/21/15, but most often > 2.0.  Hep C virus Ab < 0.1 on 06/10/16. Willeen Cass, NP had recommended further work-up prior to surgery.   CBC Latest Ref Rng & Units 03/22/2018 04/02/2013 10/31/2008  WBC 4.0 - 10.5 K/uL 3.8(L) 5.9 6.2  Hemoglobin 12.0 - 15.0 g/dL 12.8 13.9 13.5  Hematocrit 36.0 - 46.0 % 39.6 42.6 38.5  Platelets 150 - 400 K/uL 84(L) - 99(L)  PLT count 102 04/02/13.  Preoperative labs were sent for primary care review. PCP is Mary-Margaret Hassell Done, FNP. It appears that covering provider Assunta Found, MD reviewed with plans to refer to hematology before surgery with appointment scheduled for 04/02/18 Zoila Shutter, MD). However, Sherry from Dr. Stann Mainland called. Dr. Stann Mainland was okay proceeding with platelet count of 84K, but he wanted further input by an anesthesiologist. I reviewed Angela's note, lab trends, and input from primary care with anesthesiologist Charolett Bumpers, MD. Thrombocytopenia appears to be an on-going finding when compared to labs from 2010 and 2014, so he would defer to Dr. Stann Mainland if he would like to proceed with surgery as scheduled; however, patient will likely not be considered for spinal anesthesia with PLT at 84K and pending hematology work-up. I have left a voice message for Judeen Hammans regarding this.  Since she does have thrombocytopenia and mild leukopenia, I will place an order for STAT repeat CBC on arrival, coags (PT/PTT), and hold clot (so Blood Bank will have a specimen). I will defer additional orders, if any, to surgeon and/or anesthesiologist.  Myra Gianotti, PA-C [facility] Short Stay Center/Anesthesiology Phone [phone] 03/26/2018  4:38 PM

## 2018-03-27 ENCOUNTER — Ambulatory Visit (HOSPITAL_COMMUNITY): Payer: PPO | Admitting: Vascular Surgery

## 2018-03-27 ENCOUNTER — Other Ambulatory Visit: Payer: Self-pay

## 2018-03-27 ENCOUNTER — Ambulatory Visit (HOSPITAL_COMMUNITY): Payer: PPO

## 2018-03-27 ENCOUNTER — Ambulatory Visit (HOSPITAL_COMMUNITY): Payer: PPO | Admitting: Emergency Medicine

## 2018-03-27 ENCOUNTER — Encounter (HOSPITAL_COMMUNITY)
Admission: RE | Disposition: A | Discharge status: Home / Self Care | Payer: Self-pay | Source: Ambulatory Visit | Attending: Orthopedic Surgery

## 2018-03-27 ENCOUNTER — Encounter (HOSPITAL_COMMUNITY): Payer: Self-pay

## 2018-03-27 ENCOUNTER — Ambulatory Visit (HOSPITAL_COMMUNITY)
Admission: RE | Admit: 2018-03-27 | Discharge: 2018-03-27 | Disposition: A | Discharge status: Home / Self Care | Payer: PPO | Source: Ambulatory Visit | Attending: Orthopedic Surgery | Admitting: Orthopedic Surgery

## 2018-03-27 DIAGNOSIS — Z820 Family history of epilepsy and other diseases of the nervous system: Secondary | ICD-10-CM | POA: Insufficient documentation

## 2018-03-27 DIAGNOSIS — Z419 Encounter for procedure for purposes other than remedying health state, unspecified: Secondary | ICD-10-CM

## 2018-03-27 DIAGNOSIS — M94261 Chondromalacia, right knee: Secondary | ICD-10-CM | POA: Insufficient documentation

## 2018-03-27 DIAGNOSIS — M19041 Primary osteoarthritis, right hand: Secondary | ICD-10-CM | POA: Insufficient documentation

## 2018-03-27 DIAGNOSIS — Z9842 Cataract extraction status, left eye: Secondary | ICD-10-CM | POA: Diagnosis not present

## 2018-03-27 DIAGNOSIS — M4690 Unspecified inflammatory spondylopathy, site unspecified: Secondary | ICD-10-CM | POA: Diagnosis not present

## 2018-03-27 DIAGNOSIS — J449 Chronic obstructive pulmonary disease, unspecified: Secondary | ICD-10-CM | POA: Diagnosis not present

## 2018-03-27 DIAGNOSIS — J45909 Unspecified asthma, uncomplicated: Secondary | ICD-10-CM | POA: Insufficient documentation

## 2018-03-27 DIAGNOSIS — G4730 Sleep apnea, unspecified: Secondary | ICD-10-CM | POA: Insufficient documentation

## 2018-03-27 DIAGNOSIS — M84361A Stress fracture, right tibia, initial encounter for fracture: Secondary | ICD-10-CM | POA: Insufficient documentation

## 2018-03-27 DIAGNOSIS — Z87891 Personal history of nicotine dependence: Secondary | ICD-10-CM | POA: Insufficient documentation

## 2018-03-27 DIAGNOSIS — F419 Anxiety disorder, unspecified: Secondary | ICD-10-CM | POA: Diagnosis not present

## 2018-03-27 DIAGNOSIS — M797 Fibromyalgia: Secondary | ICD-10-CM | POA: Insufficient documentation

## 2018-03-27 DIAGNOSIS — S83241A Other tear of medial meniscus, current injury, right knee, initial encounter: Secondary | ICD-10-CM | POA: Insufficient documentation

## 2018-03-27 DIAGNOSIS — Z800 Family history of malignant neoplasm of digestive organs: Secondary | ICD-10-CM | POA: Insufficient documentation

## 2018-03-27 DIAGNOSIS — S83231A Complex tear of medial meniscus, current injury, right knee, initial encounter: Secondary | ICD-10-CM | POA: Diagnosis present

## 2018-03-27 DIAGNOSIS — M545 Low back pain: Secondary | ICD-10-CM | POA: Insufficient documentation

## 2018-03-27 DIAGNOSIS — K219 Gastro-esophageal reflux disease without esophagitis: Secondary | ICD-10-CM | POA: Diagnosis not present

## 2018-03-27 DIAGNOSIS — Z9049 Acquired absence of other specified parts of digestive tract: Secondary | ICD-10-CM | POA: Insufficient documentation

## 2018-03-27 DIAGNOSIS — Z9841 Cataract extraction status, right eye: Secondary | ICD-10-CM | POA: Diagnosis not present

## 2018-03-27 DIAGNOSIS — M19042 Primary osteoarthritis, left hand: Secondary | ICD-10-CM | POA: Insufficient documentation

## 2018-03-27 DIAGNOSIS — I10 Essential (primary) hypertension: Secondary | ICD-10-CM | POA: Insufficient documentation

## 2018-03-27 DIAGNOSIS — E119 Type 2 diabetes mellitus without complications: Secondary | ICD-10-CM | POA: Insufficient documentation

## 2018-03-27 DIAGNOSIS — Z8249 Family history of ischemic heart disease and other diseases of the circulatory system: Secondary | ICD-10-CM | POA: Insufficient documentation

## 2018-03-27 DIAGNOSIS — Z8042 Family history of malignant neoplasm of prostate: Secondary | ICD-10-CM | POA: Insufficient documentation

## 2018-03-27 DIAGNOSIS — X58XXXA Exposure to other specified factors, initial encounter: Secondary | ICD-10-CM | POA: Insufficient documentation

## 2018-03-27 DIAGNOSIS — Z7984 Long term (current) use of oral hypoglycemic drugs: Secondary | ICD-10-CM | POA: Insufficient documentation

## 2018-03-27 DIAGNOSIS — M1711 Unilateral primary osteoarthritis, right knee: Secondary | ICD-10-CM | POA: Diagnosis not present

## 2018-03-27 DIAGNOSIS — E785 Hyperlipidemia, unspecified: Secondary | ICD-10-CM | POA: Diagnosis not present

## 2018-03-27 DIAGNOSIS — G8929 Other chronic pain: Secondary | ICD-10-CM | POA: Diagnosis not present

## 2018-03-27 DIAGNOSIS — Z8051 Family history of malignant neoplasm of kidney: Secondary | ICD-10-CM | POA: Insufficient documentation

## 2018-03-27 DIAGNOSIS — Z86010 Personal history of colonic polyps: Secondary | ICD-10-CM | POA: Insufficient documentation

## 2018-03-27 DIAGNOSIS — I499 Cardiac arrhythmia, unspecified: Secondary | ICD-10-CM | POA: Diagnosis not present

## 2018-03-27 DIAGNOSIS — M659 Synovitis and tenosynovitis, unspecified: Secondary | ICD-10-CM | POA: Insufficient documentation

## 2018-03-27 DIAGNOSIS — S82144A Nondisplaced bicondylar fracture of right tibia, initial encounter for closed fracture: Secondary | ICD-10-CM | POA: Diagnosis not present

## 2018-03-27 DIAGNOSIS — S82131A Displaced fracture of medial condyle of right tibia, initial encounter for closed fracture: Secondary | ICD-10-CM | POA: Diagnosis not present

## 2018-03-27 DIAGNOSIS — Z79899 Other long term (current) drug therapy: Secondary | ICD-10-CM | POA: Insufficient documentation

## 2018-03-27 DIAGNOSIS — Z7982 Long term (current) use of aspirin: Secondary | ICD-10-CM | POA: Insufficient documentation

## 2018-03-27 DIAGNOSIS — Z808 Family history of malignant neoplasm of other organs or systems: Secondary | ICD-10-CM | POA: Insufficient documentation

## 2018-03-27 HISTORY — PX: KNEE ARTHROSCOPY WITH SUBCHONDROPLASTY: SHX6732

## 2018-03-27 HISTORY — DX: Complex tear of medial meniscus, current injury, right knee, initial encounter: S83.231A

## 2018-03-27 HISTORY — DX: Stress fracture, right tibia, initial encounter for fracture: M84.361A

## 2018-03-27 LAB — CBC
HEMATOCRIT: 40.2 % (ref 36.0–46.0)
HEMOGLOBIN: 13.1 g/dL (ref 12.0–15.0)
MCH: 31.9 pg (ref 26.0–34.0)
MCHC: 32.6 g/dL (ref 30.0–36.0)
MCV: 97.8 fL (ref 78.0–100.0)
Platelets: 92 10*3/uL — ABNORMAL LOW (ref 150–400)
RBC: 4.11 MIL/uL (ref 3.87–5.11)
RDW: 13 % (ref 11.5–15.5)
WBC: 4.7 10*3/uL (ref 4.0–10.5)

## 2018-03-27 LAB — GLUCOSE, CAPILLARY
GLUCOSE-CAPILLARY: 124 mg/dL — AB (ref 70–99)
Glucose-Capillary: 132 mg/dL — ABNORMAL HIGH (ref 70–99)
Glucose-Capillary: 127 mg/dL — ABNORMAL HIGH (ref 70–99)

## 2018-03-27 LAB — APTT: aPTT: 38 seconds — ABNORMAL HIGH (ref 24–36)

## 2018-03-27 LAB — PROTIME-INR
INR: 1.14
Prothrombin Time: 14.5 seconds (ref 11.4–15.2)

## 2018-03-27 LAB — ABO/RH: ABO/RH(D): O POS

## 2018-03-27 LAB — TYPE AND SCREEN
ABO/RH(D): O POS
Antibody Screen: NEGATIVE

## 2018-03-27 SURGERY — ARTHROSCOPY, KNEE, WITH SUBCHONDROPLASTY
Anesthesia: General | Site: Knee | Laterality: Right

## 2018-03-27 MED ORDER — HYDROMORPHONE HCL 1 MG/ML IJ SOLN
INTRAMUSCULAR | Status: AC
  Filled 2018-03-27: qty 1

## 2018-03-27 MED ORDER — FENTANYL CITRATE (PF) 250 MCG/5ML IJ SOLN
INTRAMUSCULAR | Status: AC
  Filled 2018-03-27: qty 5

## 2018-03-27 MED ORDER — ONDANSETRON 4 MG PO TBDP: 4.0000 mg | ORAL_TABLET | Freq: Three times a day (TID) | ORAL | 0 refills | Status: DC | PRN

## 2018-03-27 MED ORDER — FENTANYL CITRATE (PF) 100 MCG/2ML IJ SOLN: 100.0000 ug | Freq: Once | INTRAMUSCULAR | Status: DC

## 2018-03-27 MED ORDER — MIDAZOLAM HCL 2 MG/2ML IJ SOLN
INTRAMUSCULAR | Status: AC
  Filled 2018-03-27: qty 2

## 2018-03-27 MED ORDER — PROPOFOL 10 MG/ML IV BOLUS
INTRAVENOUS | Status: DC | PRN
  Administered 2018-03-27: 110 mg via INTRAVENOUS

## 2018-03-27 MED ORDER — MIDAZOLAM HCL 2 MG/2ML IJ SOLN: 2.0000 mg | Freq: Once | INTRAMUSCULAR | Status: DC

## 2018-03-27 MED ORDER — CEFAZOLIN SODIUM-DEXTROSE 2-3 GM-%(50ML) IV SOLR
INTRAVENOUS | Status: DC | PRN
  Administered 2018-03-27: 2 g via INTRAVENOUS

## 2018-03-27 MED ORDER — BUPIVACAINE-EPINEPHRINE (PF) 0.25% -1:200000 IJ SOLN
INTRAMUSCULAR | Status: AC
  Filled 2018-03-27: qty 30

## 2018-03-27 MED ORDER — ACETAMINOPHEN 10 MG/ML IV SOLN
INTRAVENOUS | Status: AC
  Filled 2018-03-27: qty 100

## 2018-03-27 MED ORDER — OXYCODONE HCL 5 MG PO TABS: 5.0000 mg | ORAL_TABLET | ORAL | 0 refills | Status: DC | PRN

## 2018-03-27 MED ORDER — ONDANSETRON HCL 4 MG/2ML IJ SOLN
INTRAMUSCULAR | Status: DC | PRN
  Administered 2018-03-27: 4 mg via INTRAVENOUS

## 2018-03-27 MED ORDER — CHLORHEXIDINE GLUCONATE 4 % EX LIQD: 60.0000 mL | Freq: Once | CUTANEOUS | Status: DC

## 2018-03-27 MED ORDER — LIDOCAINE 2% (20 MG/ML) 5 ML SYRINGE
INTRAMUSCULAR | Status: AC
  Filled 2018-03-27: qty 15

## 2018-03-27 MED ORDER — ACETAMINOPHEN 10 MG/ML IV SOLN
INTRAVENOUS | Status: DC | PRN
  Administered 2018-03-27: 1000 mg via INTRAVENOUS

## 2018-03-27 MED ORDER — SODIUM CHLORIDE 0.9 % IR SOLN
Status: DC | PRN
  Administered 2018-03-27: 3000 mL

## 2018-03-27 MED ORDER — LACTATED RINGERS IV SOLN
INTRAVENOUS | Status: DC | PRN
  Administered 2018-03-27: 11:00:00 via INTRAVENOUS

## 2018-03-27 MED ORDER — ROCURONIUM BROMIDE 10 MG/ML (PF) SYRINGE
PREFILLED_SYRINGE | INTRAVENOUS | Status: AC
  Filled 2018-03-27: qty 10

## 2018-03-27 MED ORDER — LIDOCAINE 2% (20 MG/ML) 5 ML SYRINGE
INTRAMUSCULAR | Status: DC | PRN
  Administered 2018-03-27: 60 mg via INTRAVENOUS
  Administered 2018-03-27: 50 mg via INTRAVENOUS

## 2018-03-27 MED ORDER — BUPIVACAINE-EPINEPHRINE 0.25% -1:200000 IJ SOLN
INTRAMUSCULAR | Status: DC | PRN
  Administered 2018-03-27: 20 mL

## 2018-03-27 MED ORDER — GLYCOPYRROLATE PF 0.2 MG/ML IJ SOSY
PREFILLED_SYRINGE | INTRAMUSCULAR | Status: AC
  Filled 2018-03-27: qty 3

## 2018-03-27 MED ORDER — PHENYLEPHRINE 40 MCG/ML (10ML) SYRINGE FOR IV PUSH (FOR BLOOD PRESSURE SUPPORT)
PREFILLED_SYRINGE | INTRAVENOUS | Status: AC
  Filled 2018-03-27: qty 10

## 2018-03-27 MED ORDER — FENTANYL CITRATE (PF) 100 MCG/2ML IJ SOLN
INTRAMUSCULAR | Status: DC | PRN
  Administered 2018-03-27: 25 ug via INTRAVENOUS
  Administered 2018-03-27: 100 ug via INTRAVENOUS

## 2018-03-27 MED ORDER — SODIUM CHLORIDE 0.9 % IV SOLN
INTRAVENOUS | Status: DC | PRN
  Administered 2018-03-27: 10 ug/min via INTRAVENOUS

## 2018-03-27 MED ORDER — FENTANYL CITRATE (PF) 100 MCG/2ML IJ SOLN
INTRAMUSCULAR | Status: DC
Start: 2018-03-27 — End: 2018-03-27
  Filled 2018-03-27: qty 2

## 2018-03-27 MED ORDER — DEXAMETHASONE SODIUM PHOSPHATE 10 MG/ML IJ SOLN
INTRAMUSCULAR | Status: AC
  Filled 2018-03-27: qty 1

## 2018-03-27 MED ORDER — HYDROMORPHONE HCL 1 MG/ML IJ SOLN
0.2500 mg | INTRAMUSCULAR | Status: DC | PRN
  Administered 2018-03-27 (×2): 0.5 mg via INTRAVENOUS

## 2018-03-27 MED ORDER — ONDANSETRON HCL 4 MG/2ML IJ SOLN
INTRAMUSCULAR | Status: AC
  Filled 2018-03-27: qty 6

## 2018-03-27 MED ORDER — ROCURONIUM BROMIDE 10 MG/ML (PF) SYRINGE
PREFILLED_SYRINGE | INTRAVENOUS | Status: DC | PRN
  Administered 2018-03-27: 50 mg via INTRAVENOUS

## 2018-03-27 MED ORDER — SUGAMMADEX SODIUM 200 MG/2ML IV SOLN
INTRAVENOUS | Status: DC | PRN
Start: 2018-03-27 — End: 2018-03-27
  Administered 2018-03-27: 250 mg via INTRAVENOUS

## 2018-03-27 SURGICAL SUPPLY — 27 items
ALCOHOL 70% 16 OZ (MISCELLANEOUS) ×3 IMPLANT
BANDAGE ACE 6X5 VEL STRL LF (GAUZE/BANDAGES/DRESSINGS) ×3 IMPLANT
BLADE CUTTER GATOR 3.5 (BLADE) ×3 IMPLANT
BLADE SURG 10 STRL SS (BLADE) IMPLANT
COVER SURGICAL LIGHT HANDLE (MISCELLANEOUS) ×3 IMPLANT
DRAPE U-SHAPE 47X51 STRL (DRAPES) ×3 IMPLANT
DURAPREP 26ML APPLICATOR (WOUND CARE) ×3 IMPLANT
GAUZE 4X4 16PLY RFD (DISPOSABLE) ×3 IMPLANT
GAUZE SPONGE 4X4 12PLY STRL LF (GAUZE/BANDAGES/DRESSINGS) ×3 IMPLANT
GAUZE XEROFORM 1X8 LF (GAUZE/BANDAGES/DRESSINGS) ×3 IMPLANT
GLOVE BIO SURGEON STRL SZ7.5 (GLOVE) ×3 IMPLANT
GLOVE BIOGEL PI IND STRL 8 (GLOVE) ×1 IMPLANT
GLOVE BIOGEL PI INDICATOR 8 (GLOVE) ×2
GOWN STRL REUS W/TWL LRG LVL3 (GOWN DISPOSABLE) ×6 IMPLANT
GOWN STRL REUS W/TWL XL LVL3 (GOWN DISPOSABLE) IMPLANT
KIT BASIN OR (CUSTOM PROCEDURE TRAY) ×3 IMPLANT
KIT MIXER ACCUMIX (KITS) ×3 IMPLANT
KNEE KIT SCP W/SIDE ACCUPORT (Joint) ×2 IMPLANT
MANIFOLD NEPTUNE II (INSTRUMENTS) ×3 IMPLANT
PACK ARTHROSCOPY DSU (CUSTOM PROCEDURE TRAY) ×3 IMPLANT
PAD ABD 8X10 STRL (GAUZE/BANDAGES/DRESSINGS) ×3 IMPLANT
PADDING CAST COTTON 6X4 STRL (CAST SUPPLIES) ×3 IMPLANT
SUT ETHILON 4 0 PS 2 18 (SUTURE) ×3 IMPLANT
SYR 30ML LL (SYRINGE) ×3 IMPLANT
TOWEL OR 17X26 10 PK STRL BLUE (TOWEL DISPOSABLE) ×3 IMPLANT
TUBING ARTHROSCOPY IRRIG 16FT (MISCELLANEOUS) ×3 IMPLANT
WRAP KNEE MAXI GEL POST OP (GAUZE/BANDAGES/DRESSINGS) ×3 IMPLANT

## 2018-03-27 NOTE — H&P (Signed)
ORTHOPAEDIC H and P  REQUESTING PHYSICIAN: Nicholes Stairs, MD  PCP:  Chevis Pretty, FNP  Chief Complaint: Right knee pain  HPI: Stacey Summers is a 72 y.o. female who complains of about 3 months now of excruciating right medial joint line knee pain.  She was initially treated by my partner Dr. Tonita Cong and found to have medial meniscus tear with concomitant proximal medial tibial plateau stress fracture.  She is indicated for operative management of the meniscus and bone lesion.  We discussed moving forward with arthroscopic partial medial meniscectomy with sub-chondroplasty of the tibial plateau fracture.  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  Her platelet count is 84,000 which is slightly below normal.  Given the minimally invasive nature of this procedure we have elected to go ahead and proceed with this, however having her follow-up with a hematologist postoperatively.   Otherwise, she has no new complaints at this time.  Past Medical History:  Diagnosis Date  . Allergy    seasonal  . Anxiety   . Arthritis    hands, back   . Asthma    bronchitis  . Cataract    removed both eyes  . Chronic back pain    lower back   . COPD (chronic obstructive pulmonary disease) ([place])   . Diabetes mellitus without complication ([place])   . DJD (degenerative joint disease)   . Fibromyalgia   . Genetic testing 03/08/2017   Ms. Hogue underwent genetic counseling and testing for hereditary cancer syndromes on 02/21/2017. Her results were negative for mutations in all 83 genes analyzed by [facility]'s 83-gene Multi-Cancers Panel. Genes analyzed include: ALK, APC, ATM, AXIN2, BAP1, BARD1, BLM, BMPR1A, BRCA1, BRCA2, BRIP1, CASR, CDC73, CDH1, CDK4, CDKN1B, CDKN1C, CDKN2A, CEBPA, CHEK2, CTNNA1, DICER1, DIS3L2, EGFR, EPCAM  . GERD (gastroesophageal reflux disease)   . History of abnormal mammogram   .  Hyperlipidemia   . Hypertension   . Irregular heart beat   . Neuromuscular disorder (HCC)    fibromyalgia  . Sleep apnea    cpap some  . Tubular adenoma of colon 05/09/12   "innumerable number" of flat polyps   Past Surgical History:  Procedure Laterality Date  . CARPAL TUNNEL RELEASE Left 10/20/14   [facility]  . [place]  . CHOLECYSTECTOMY  1990  . COLONOSCOPY    . LARYNX SURGERY     POLYP REMOVAL  . POLYPECTOMY     vocal cord  . TONSILLECTOMY  1965  . TUMOR REMOVAL     benign fatty tumors on abdomen   Social History   Socioeconomic History  . Marital status: Married    Spouse name: Not on file  . Number of children: 3  . Years of education: Not on file  . Highest education level: Not on file  Occupational History  . Occupation: retired  Scientific laboratory technician  . Financial resource strain: Not on file  . Food insecurity:    Worry: Not on file    Inability: Not on file  . Transportation needs:    Medical: Not on file    Non-medical: Not on file  Tobacco Use  . Smoking status: Former Smoker    Packs/day: 1.00    Years: 3.00    Pack years: 3.00    Types: Cigarettes    Last attempt to quit: 08/15/1969    Years since quitting: 48.6  .  Smokeless tobacco: Never Used  Substance and Sexual Activity  . Alcohol use: No    Alcohol/week: 0.0 standard drinks  . Drug use: No  . Sexual activity: Yes  Lifestyle  . Physical activity:    Days per week: Not on file    Minutes per session: Not on file  . Stress: Not on file  Relationships  . Social connections:    Talks on phone: Not on file    Gets together: Not on file    Attends religious service: Not on file    Active member of club or organization: Not on file    Attends meetings of clubs or organizations: Not on file    Relationship status: Not on file  Other Topics Concern  . Not on file  Social History Narrative  . Not on file   Family History  Problem Relation Age of Onset  . Prostate cancer Father         d.89 diagnosed in late-60s  . Colon cancer Father        diagnosed at 67  . Heart failure Mother        d.37  . Heart disease Mother   . Colon polyps Brother        polyposis. Underwent partial colectomy at age 1.  Marland Kitchen Spina bifida Brother        died at birth  . Stomach cancer Paternal Aunt   . Brain cancer Cousin 19       paternal first-cousin-once-removed  . Kidney cancer Cousin        d.78s maternal first-cousin  . Other Other 16       multiple lipomas  . Colon cancer Paternal Uncle        in his 3s  . Heart failure Maternal Grandmother        d.45  . Heart failure Maternal Uncle        d.50  . Colon polyps Son 69  . Heart attack Son 63  . Esophageal cancer Neg Hx   . Rectal cancer Neg Hx    No Known Allergies Prior to Admission medications   Medication Sig Start Date End Date Taking? Authorizing Provider  ALPRAZolam (XANAX) 0.25 MG tablet Take 1 tablet (0.25 mg total) by mouth 2 (two) times daily as needed. Patient taking differently: Take 0.25 mg by mouth 2 (two) times daily as needed for anxiety.  02/06/18  Yes Martin, Mary-Margaret, FNP  amLODipine (NORVASC) 5 MG tablet Take 1 tablet (5 mg total) by mouth daily. 02/06/18  Yes Hassell Done, Mary-Margaret, FNP  aspirin 81 MG tablet Take 81 mg by mouth daily.    Yes [provider]  atorvastatin (LIPITOR) 40 MG tablet Take 1 tablet (40 mg total) by mouth daily. 02/06/18  Yes Martin, Mary-Margaret, FNP  benazepril (LOTENSIN) 40 MG tablet Take 1 tablet (40 mg total) by mouth daily. 02/06/18  Yes Martin, Mary-Margaret, FNP  Blood Glucose Monitoring Suppl (LIBERTY BLOOD GLUCOSE MONITOR) DEVI 1 each by Does not apply route daily. Test 1x per day and prn-dx250.02 02/22/13  Yes Hassell Done, Mary-Margaret, FNP  Cholecalciferol (VITAMIN D3) 5000 units CAPS Take 5,000 Units by mouth daily.   Yes [provider]  cyclobenzaprine (FLEXERIL) 5 MG tablet TAKE 1 TABLET BY MOUTH THREE TIMES DAILY AS NEEDED FOR MUSCLE SPASMS (NEEDS  APPOINTMENT) 01/22/18  Yes Hassell Done, Mary-Margaret, FNP  furosemide (LASIX) 40 MG tablet TAKE 1 BY MOUTH EVERY MORNING AND 1/2 BY MOUTH AT 1 IN THE AFTERNOON Patient  taking differently: Take 20-40 mg by mouth See admin instructions. Take 40 mg in the morning and take 20 to 40 mg in the afternoon (based on swelling) 02/06/18  Yes Hassell Done, Mary-Margaret, FNP  glucose blood test strip Test 1x per day and prn- dx 250.02 04/25/14  Yes Hassell Done, Mary-Margaret, FNP  meclizine (ANTIVERT) 25 MG tablet Take 1 tablet (25 mg total) by mouth 3 (three) times daily as needed for dizziness. 05/04/17  Yes Hassell Done, Mary-Margaret, FNP  metFORMIN (GLUCOPHAGE) 500 MG tablet Take 1 tablet (500 mg total) by mouth 2 (two) times daily with a meal. 02/06/18  Yes Hassell Done, Mary-Margaret, FNP  omeprazole (PRILOSEC) 40 MG capsule Take 1 capsule (40 mg total) by mouth daily. 02/06/18  Yes Hassell Done, Mary-Margaret, FNP  ONE TOUCH ULTRA TEST test strip USE ONE STRIP TO CHECK GLUCOSE ONCE DAILY 03/06/17  Yes Hassell Done, Mary-Margaret, FNP  [facility] DELICA LANCETS 79T MISC USE ONE LANCET TO CHECK GLUCOSE ONCE DAILY AND AS NEEDED 10/10/17  Yes Hassell Done, Mary-Margaret, FNP  traMADol (ULTRAM) 50 MG tablet Take 2 tablets (100 mg total) by mouth 3 (three) times daily. Patient taking differently: Take 50 mg by mouth 3 (three) times daily as needed for moderate pain.  12/26/17  Yes Stacks, Cletus Gash, MD  Carboxymethylcellul-Glycerin (LUBRICATING EYE DROPS OP) Place 1 drop into both eyes daily as needed (dry eyes).    [provider]  umeclidinium-vilanterol (ANORO ELLIPTA) 62.5-25 MCG/INH AEPB Inhale 1 puff into the lungs daily. Patient not taking: Reported on 03/20/2018 09/01/17   Noralee Space, MD   No results found.  Positive ROS: All other systems have been reviewed and were otherwise negative with the exception of those mentioned in the HPI and as above.  Physical Exam: General: Alert, no acute distress Cardiovascular: No pedal edema Respiratory: No  cyanosis, no use of accessory musculature GI: No organomegaly, abdomen is soft and non-tender Skin: No lesions in the area of chief complaint Neurologic: Sensation intact distally Psychiatric: Patient is competent for consent with normal mood and affect Lymphatic: No axillary or cervical lymphadenopathy    Assessment: 1.  Right knee medial meniscus tear 2.  Right knee medial tibial plateau stress fracture  Plan: -Plan for arthroscopic intervention of the right knee today.  We will plan for partial medial meniscectomy with medial tibial plateau sub-chondroplasty.  We again reviewed the risk and benefits of this procedure and discussed the indications for the procedure and the postoperative convalescence.  She has provided informed consent. -We will plan on allowing her to discharge home from PACU with weightbearing as tolerated utilizing her anterior walker for assistance. - The risks, benefits, and alternatives were discussed with the patient. There are risks associated with the surgery including, but not limited to, problems with anesthesia (death), infection, fracture of bones, loosening or failure of implants, malunion, nonunion, hematoma (blood accumulation) which may require surgical drainage, blood clots, pulmonary embolism, nerve injury (foot drop), and blood vessel injury. The patient understands these risks and elects to proceed.      Nicholes Stairs, MD Cell [phone]    03/27/2018 12:27 PM

## 2018-03-27 NOTE — Anesthesia Postprocedure Evaluation (Signed)
Anesthesia Post Note  Patient: Delorice Bannister Bressman  Procedure(s) Performed: Right knee arthroscopic partial medial meniscus repair versus menisectomy with medial tibia subchondroplasty (Right Knee)     Patient location during evaluation: PACU Anesthesia Type: General Level of consciousness: awake and alert Pain management: pain level controlled Vital Signs Assessment: post-procedure vital signs reviewed and stable Respiratory status: spontaneous breathing, nonlabored ventilation, respiratory function stable and patient connected to nasal cannula oxygen Cardiovascular status: blood pressure returned to baseline and stable Postop Assessment: no apparent nausea or vomiting Anesthetic complications: no    Last Vitals:  Vitals:   03/27/18 1457 03/27/18 1500  BP: (!) 132/55   Pulse: 77 79  Resp: 15 (!) 21  Temp:    SpO2: 100% 98%    Last Pain:  Vitals:   03/27/18 1520  TempSrc:   PainSc: 10-Worst pain ever                 Nain Rudd L Rosiland Sen

## 2018-03-27 NOTE — Brief Op Note (Signed)
03/27/2018  2:27 PM  PATIENT:  Stacey Summers  72 y.o. female  PRE-OPERATIVE DIAGNOSIS:  Right knee medial meniscus tear and medial tibia plateau fracture  POST-OPERATIVE DIAGNOSIS:  Right knee medial meniscus tear and medial tibia plateau fracture  PROCEDURE:  Procedure(s) with comments: Right knee arthroscopic partial medial meniscus repair versus menisectomy with medial tibia subchondroplasty (Right) - 120 mins  SURGEON:  Surgeon(s) and Role:    * Stann Mainland, Elly Modena, MD - Primary  PHYSICIAN ASSISTANT:   ASSISTANTS: Laure Kidney, RNFA   ANESTHESIA:   local and general  EBL: 5 cc  BLOOD ADMINISTERED:none  DRAINS: none   LOCAL MEDICATIONS USED:  MARCAINE     SPECIMEN:  No Specimen  DISPOSITION OF SPECIMEN:  N/A  COUNTS:  YES  TOURNIQUET:  * No tourniquets in log *  DICTATION: .Note written in EPIC  PLAN OF CARE: Discharge to home after PACU  PATIENT DISPOSITION:  PACU - hemodynamically stable.   Delay start of Pharmacological VTE agent (>24hrs) due to surgical blood loss or risk of bleeding: not applicable

## 2018-03-27 NOTE — Transfer of Care (Signed)
Immediate Anesthesia Transfer of Care Note  Patient: Stacey Summers  Procedure(s) Performed: Right knee arthroscopic partial medial meniscus repair versus menisectomy with medial tibia subchondroplasty (Right Knee)  Patient Location: PACU  Anesthesia Type:General  Level of Consciousness: awake, alert , oriented and patient cooperative  Airway & Oxygen Therapy: Patient Spontanous Breathing and Patient connected to face mask oxygen  Post-op Assessment: Report given to RN and Post -op Vital signs reviewed and stable  Post vital signs: Reviewed and stable  Last Vitals:  Vitals Value Taken Time  BP 136/71 03/27/2018  2:27 PM  Temp    Pulse 85 03/27/2018  2:29 PM  Resp 20 03/27/2018  2:29 PM  SpO2 99 % 03/27/2018  2:29 PM  Vitals shown include unvalidated device data.  Last Pain:  Vitals:   03/27/18 1039  TempSrc:   PainSc: 5       Patients Stated Pain Goal: 4 (01/77/93 9030)  Complications: No apparent anesthesia complications

## 2018-03-27 NOTE — Discharge Instructions (Signed)
-  Weightbearing as tolerated allowed to the right lower extremity with assistive devices for 2 weeks. -You may remove your postoperative bandages in 3 days.  At that time he may begin showering.  Do not submerge underwater.  Apply Band-Aids to your incisions until your follow-up appointment. -For the prevention of blood clots take an 81 mg aspirin twice daily for 6 weeks. -For mild to moderate pain use Tylenol and/or ibuprofen as directed.  For breakthrough pain use oxycodone as directed. -Return to see Dr. Stann Mainland in 2 weeks for routine postoperative follow-up.

## 2018-03-27 NOTE — Op Note (Signed)
Surgeon(s): Nicholes Stairs, MD   ANESTHESIA:  general, and regional   FLUIDS: Per anesthesia record.    ESTIMATED BLOOD LOSS: minimal     PREOPERATIVE DIAGNOSES:  1.  Right knee medial meniscus tear 2.    Right medial tibia condyle insufficiency fracture 3.   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 Right knee arthroscopically aided treatment of medial Tibial Plateau insufficiency fracture with percutaneous internal fixation (subchondroplasty)  2. Right knee arthroscopy with arthroscopic partial medial meniscectomy 3.  Chondroplasty, right medial femoral condyle   Implant: Flowable calcium phosphate, 5 mL. Zimmer   DESCRIPTION OF PROCEDURE: The patient has a Right knee medial meniscus tear. They have had pain that has been refractory to conservative management. Their preoperative MRI demonstrated subchondral bone marrow edema and insufficiency fractures of the  right medial tibial plateau as well as the medial meniscus tear. Plans are to proceed with partial medial meniscectomy, internal fixation of subchondral insufficiency fractures with flowable calcium phosphate, and diagnostic arthroscopy with debridement as indicated. Full discussion held regarding risks benefits alternatives and complications related surgical intervention. Conservative care options reviewed. All questions answered.   The patient was identified in the preoperative holding area and the operative extremity was marked. The patient was brought to the operating room and transferred to operating table in a supine position. Satisfactory general anesthesia was induced by anesthesiology.     Standard anterolateral, anteromedial arthroscopy portals were obtained. The anteromedial portal was obtained with a spinal needle for localization under direct visualization with subsequent diagnostic findings.    Anteromedial and  anterolateral chambers: mild synovitis. The synovitis was debrided with a 4.5 mm full radius shaver through both the anteromedial and lateral portals.    Suprapatellar pouch and gutters: mild synovitis or debris. Patella chondral surface: Grade 0 Trochlear chondral surface: Grade 1 Patellofemoral tracking: level Medial meniscus: posterior horn  radial tear 5 mm medial to the posterior root Medial femoral condyle flexion bearing surface: Grade 3 Medial femoral condyle extension bearing surface: Grade 2 Medial tibial plateau: Grade 1 Anterior cruciate ligament:stable Posterior cruciate ligament:stable Lateral meniscus: no tear.   Lateral femoral condyle flexion bearing surface: Grade 0 Lateral femoral condyle extension bearing surface: Grade 0 Lateral tibial plateau: Grade 0   Medial meniscus tear was debrided using biters and motorized shaver alternating until a stable remnant was left. Upon completion the probe was used to evaluate and assess the remaining meniscus which was gleaned to be stable.   Chondroplasty was achieved on the medial femoral condyle using a motorized shaver to debride the grade 3 unstable cartilage. Completion of the chondroplasty left A medial femoral condyle with smooth stable surface. There was no full-thickness component noted.   Next we turned our attention to the internal fixation of the  medial tibial condyle. Arthroscopically we evaluated the medial tibia condyle noted there was no loose cartilage or debris surrounding the lesion and the fracture did not propagate to the joint surface. Using preoperative MRI we targeted the delivery device to just under the subchondral density and in the proximal meidal tibial plateau. This was achieved with intraoperative fluoroscopy. Once accurate placement was noted on 2 views and confirmed we delivered 5 mL of flowable calcium phosphate into the subchondral lesion. We left the cannulas in place for approximately 8 minutes while  the implant hardened. We removed the cannulas and again took 2 views of fluoroscopic pictures to confirm  there was no extravasation outside of the bone. There was none noted.   After completion of synovectomy, diagnostic exam, and debridements as described, all compartments were checked and no residual debris remained. Hemostasis was achieved with the cautery wand. The portals were approximated with nylon suture. All excess fluid was expressed from the joint.  Xeroform sterile gauze dressings were applied followed by Ace bandage and ice pack.    There were no immediate competitions and all counts were correct.   DISPOSITION: The patient was awakened from general anesthetic, extubated, taken to the recovery room in medically stable condition, no apparent complications. The patient may be weightbearing as tolerated to the operative lower extremity with crutches.  Range of motion of right knee as tolerated.  They will use bid asa for DVT ppx for 6 weeks, and return in 2 weeks for suture removal.   Nicholes Stairs

## 2018-03-27 NOTE — Anesthesia Procedure Notes (Signed)
Procedure Name: Intubation Date/Time: 03/27/2018 1:11 PM Performed by: Cleda Daub, CRNA Pre-anesthesia Checklist: Patient identified, Emergency Drugs available, Suction available and Patient being monitored Patient Re-evaluated:Patient Re-evaluated prior to induction Oxygen Delivery Method: Circle system utilized Preoxygenation: Pre-oxygenation with 100% oxygen Induction Type: IV induction Ventilation: Mask ventilation without difficulty and Mask ventilation throughout procedure Laryngoscope Size: Glidescope Grade View: Grade I Tube type: Oral Tube size: 7.0 mm Number of attempts: 1 Airway Equipment and Method: Stylet and Video-laryngoscopy Placement Confirmation: ETT inserted through vocal cords under direct vision,  positive ETCO2 and breath sounds checked- equal and bilateral Secured at: 22 cm Tube secured with: Tape Dental Injury: Teeth and Oropharynx as per pre-operative assessment  Comments: Pt with thrombocytopenia; glidescope utilized to minimize any unwanted airway injury.

## 2018-03-30 ENCOUNTER — Encounter (HOSPITAL_COMMUNITY): Payer: Self-pay | Admitting: Orthopedic Surgery

## 2018-04-02 ENCOUNTER — Encounter (HOSPITAL_COMMUNITY): Payer: Self-pay | Admitting: Internal Medicine

## 2018-04-02 ENCOUNTER — Inpatient Hospital Stay (HOSPITAL_COMMUNITY): Payer: PPO | Attending: Internal Medicine | Admitting: Internal Medicine

## 2018-04-02 ENCOUNTER — Inpatient Hospital Stay (HOSPITAL_COMMUNITY): Payer: PPO

## 2018-04-02 VITALS — BP 123/59 | HR 73 | Temp 98.0°F | Resp 18 | Wt 222.0 lb

## 2018-04-02 DIAGNOSIS — F411 Generalized anxiety disorder: Secondary | ICD-10-CM | POA: Insufficient documentation

## 2018-04-02 DIAGNOSIS — M8580 Other specified disorders of bone density and structure, unspecified site: Secondary | ICD-10-CM

## 2018-04-02 DIAGNOSIS — Z86010 Personal history of colonic polyps: Secondary | ICD-10-CM | POA: Insufficient documentation

## 2018-04-02 DIAGNOSIS — Z87891 Personal history of nicotine dependence: Secondary | ICD-10-CM | POA: Insufficient documentation

## 2018-04-02 DIAGNOSIS — E785 Hyperlipidemia, unspecified: Secondary | ICD-10-CM | POA: Diagnosis not present

## 2018-04-02 DIAGNOSIS — G4730 Sleep apnea, unspecified: Secondary | ICD-10-CM

## 2018-04-02 DIAGNOSIS — E669 Obesity, unspecified: Secondary | ICD-10-CM | POA: Diagnosis not present

## 2018-04-02 DIAGNOSIS — R17 Unspecified jaundice: Secondary | ICD-10-CM | POA: Insufficient documentation

## 2018-04-02 DIAGNOSIS — J449 Chronic obstructive pulmonary disease, unspecified: Secondary | ICD-10-CM | POA: Insufficient documentation

## 2018-04-02 DIAGNOSIS — R5381 Other malaise: Secondary | ICD-10-CM

## 2018-04-02 DIAGNOSIS — M797 Fibromyalgia: Secondary | ICD-10-CM

## 2018-04-02 DIAGNOSIS — Z800 Family history of malignant neoplasm of digestive organs: Secondary | ICD-10-CM | POA: Insufficient documentation

## 2018-04-02 DIAGNOSIS — Z7982 Long term (current) use of aspirin: Secondary | ICD-10-CM | POA: Diagnosis not present

## 2018-04-02 DIAGNOSIS — Z7984 Long term (current) use of oral hypoglycemic drugs: Secondary | ICD-10-CM | POA: Insufficient documentation

## 2018-04-02 DIAGNOSIS — E119 Type 2 diabetes mellitus without complications: Secondary | ICD-10-CM

## 2018-04-02 DIAGNOSIS — M2550 Pain in unspecified joint: Secondary | ICD-10-CM

## 2018-04-02 DIAGNOSIS — J984 Other disorders of lung: Secondary | ICD-10-CM

## 2018-04-02 DIAGNOSIS — K219 Gastro-esophageal reflux disease without esophagitis: Secondary | ICD-10-CM | POA: Diagnosis not present

## 2018-04-02 DIAGNOSIS — I10 Essential (primary) hypertension: Secondary | ICD-10-CM

## 2018-04-02 DIAGNOSIS — D696 Thrombocytopenia, unspecified: Secondary | ICD-10-CM

## 2018-04-02 DIAGNOSIS — R011 Cardiac murmur, unspecified: Secondary | ICD-10-CM

## 2018-04-02 DIAGNOSIS — Z79899 Other long term (current) drug therapy: Secondary | ICD-10-CM

## 2018-04-02 DIAGNOSIS — R700 Elevated erythrocyte sedimentation rate: Secondary | ICD-10-CM | POA: Insufficient documentation

## 2018-04-02 DIAGNOSIS — R5383 Other fatigue: Secondary | ICD-10-CM

## 2018-04-02 LAB — COMPREHENSIVE METABOLIC PANEL
ALT: 24 U/L (ref 0–44)
AST: 35 U/L (ref 15–41)
Albumin: 3.3 g/dL — ABNORMAL LOW (ref 3.5–5.0)
Alkaline Phosphatase: 86 U/L (ref 38–126)
Anion gap: 12 (ref 5–15)
BILIRUBIN TOTAL: 1.9 mg/dL — AB (ref 0.3–1.2)
BUN: 17 mg/dL (ref 8–23)
CALCIUM: 9 mg/dL (ref 8.9–10.3)
CO2: 28 mmol/L (ref 22–32)
CREATININE: 0.82 mg/dL (ref 0.44–1.00)
Chloride: 103 mmol/L (ref 98–111)
GFR calc non Af Amer: >60 mL/min (ref >60)
GLUCOSE: 158 mg/dL — AB (ref 70–99)
Potassium: 3.7 mmol/L (ref 3.5–5.1)
SODIUM: 143 mmol/L (ref 135–145)
TOTAL PROTEIN: 5.9 g/dL — AB (ref 6.5–8.1)

## 2018-04-02 LAB — SEDIMENTATION RATE: SED RATE: 37 mm/h — AB (ref 0–22)

## 2018-04-02 LAB — CBC WITH DIFFERENTIAL/PLATELET
Basophils Absolute: 0 10*3/uL (ref 0.0–0.1)
Basophils Relative: 0 %
EOS ABS: 0.1 10*3/uL (ref 0.0–0.7)
Eosinophils Relative: 3 %
HEMATOCRIT: 36.5 % (ref 36.0–46.0)
HEMOGLOBIN: 12.2 g/dL (ref 12.0–15.0)
LYMPHS ABS: 1.3 10*3/uL (ref 0.7–4.0)
LYMPHS PCT: 29 %
MCH: 32.2 pg (ref 26.0–34.0)
MCHC: 33.4 g/dL (ref 30.0–36.0)
MCV: 96.3 fL (ref 78.0–100.0)
MONOS PCT: 11 %
Monocytes Absolute: 0.5 10*3/uL (ref 0.1–1.0)
NEUTROS PCT: 57 %
Neutro Abs: 2.5 10*3/uL (ref 1.7–7.7)
Platelets: 98 10*3/uL — ABNORMAL LOW (ref 150–400)
RBC: 3.79 MIL/uL — AB (ref 3.87–5.11)
RDW: 13 % (ref 11.5–15.5)
WBC: 4.5 10*3/uL (ref 4.0–10.5)

## 2018-04-02 LAB — LACTATE DEHYDROGENASE: LDH: 184 U/L (ref 98–192)

## 2018-04-02 NOTE — Progress Notes (Signed)
Referring  practice:   [facility] Diagnosis Thrombocytopenia ([place]) - Plan: CBC with Differential/Platelet, Comprehensive metabolic panel, Lactate dehydrogenase, Protein electrophoresis, serum, ANA, IFA (with reflex), Rheumatoid factor, Sedimentation rate, Pathologist smear review  Staging Cancer Staging No matching staging information was found for the patient.  Assessment and Plan: 1.  Thrombocytopenia.  Labs done today 04/02/2018 showed a white count 4.5 hemoglobin 12.2 platelets 98,000 she has a normal differential.  No fragmentation noted.  Chemistries within normal limits with a potassium of 3.7 creatinine 0.83 AST 35 ALT 24 bilirubin 1.9.  Sed rate is elevated at 37.  LDH is normal at 184. Coagulation studies done 03/27/2018 showed a PT 14.5 PTT 38.  Will obtain previous labs from PCP as review of epic chart going back to 2017 shows no prior CBCs.  She will return to clinic to go the results of her remaining lab studies.  She is advised to avoid nonsteroidal medications.  We will ask for formal path review.    2.  Joint pain.  Awaiting results of SPEP, C-reactive protein, ANA, rheumatoid factor.  She has an elevated sed rate of 37.  She had recent right meniscus surgery done in August 2019.  Pt has fibromyalgia.  Follow-up with PCP.    3.  Elevated Bilirubin.  Pt labs show elevated bilirubin dating back to 2017.  Will consider abdominal imaging for further evaluation of liver, gallbladder and spleen.    4.  Family history of colon cancer.  She reports this occurred in her father.  Patient reports multiple polyps on colonoscopy evaluation and she has frequent exams due to this.  She had genetic testing done that was negative.  5.  Hypertension.  Blood pressure is 123/59.  Follow-up with PCP.  6.  Health maintenance.  She had recent mammogram done October 06/03/2017 that was negative.  Recent colonoscopy was done 12/06/2016 that showed several polyps that were negative for malignancy.  Patient has  undergone genetic testing that was negative.  Greater than 40 minutes spent with more than 50% spent in counseling and coordination of care and review of records.    HPI: 72 year old female referred for evaluation due to thrombocytopenia.  She had recent surgery on her right knee.  She denies any bleeding with the surgery.  She denied nonsteroidal use.  She reports a family history of colon cancer in her father..  She has frequent GI evaluations due to showing evidence of multiple polyps.  Review of chart shows labs done March 21, 2018 showed a white count of 3.8 hemoglobin 12.8 platelets 84,000 MCV was 101.  She had a normal differential.  Chemistries within normal limits other than an elevated bilirubin.  She reports she bruises easily.  She denies any chills, night sweats, and has noted no adenopathy.  She reported low-grade temperature after surgery.  Patient is seen today for consultation due to thrombocytopenia.  Problem List Patient Active Problem List   Diagnosis Date Noted  . Complex tear of medial meniscus of right knee [S83.231A] 03/27/2018  . Stress fracture of right tibia [H03.888K] 03/27/2018  . Chronic pain of right knee [M25.561, G89.29] 12/19/2017  . Genetic testing [Z13.79] 03/08/2017  . 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 Physical deconditioning [R53.81] 08/04/2015  . OSA treated with BiPAP [G47.33] 08/04/2015  . Gastroesophageal reflux disease without esophagitis [K21.9] 12/25/2014  . Low serum vitamin D [R79.89] 05/28/2014  . Osteopenia [M85.80] 05/28/2014  . COPD (chronic obstructive pulmonary disease) ([place]) [J44.9] 01/29/2014  .  Dyspnea on exertion [R06.09] 01/07/2014  . Fatigue [R53.83] March 22, 2013  . Murmur, cardiac [R01.1] 03-22-2013  . Diabetes ([place]) [E11.9] 2013/03/22  . Morbid obesity ([place]) [E66.01] 03/22/2013  . Family history of sudden death [Z84.89] 03/22/2013  . Hyperlipidemia [E78.5] 03/22/13  . Hypertension [I10]  12/20/2012  . GAD (generalized anxiety disorder) [F41.1] 12/20/2012  . Fibromyalgia muscle pain [M79.7] 12/20/2012    Past Medical History Past Medical History:  Diagnosis Date  . Allergy    seasonal  . Anxiety   . Arthritis    hands, back   . Asthma    bronchitis  . Cataract    removed both eyes  . Chronic back pain    lower back   . COPD (chronic obstructive pulmonary disease) ([place])   . Diabetes mellitus without complication ([place])   . DJD (degenerative joint disease)   . Fibromyalgia   . Genetic testing 03/08/2017   Ms. Tyndall underwent genetic counseling and testing for hereditary cancer syndromes on 02/21/2017. Her results were negative for mutations in all 83 genes analyzed by [facility]'s 83-gene Multi-Cancers Panel. Genes analyzed include: ALK, APC, ATM, AXIN2, BAP1, BARD1, BLM, BMPR1A, BRCA1, BRCA2, BRIP1, CASR, CDC73, CDH1, CDK4, CDKN1B, CDKN1C, CDKN2A, CEBPA, CHEK2, CTNNA1, DICER1, DIS3L2, EGFR, EPCAM  . GERD (gastroesophageal reflux disease)   . History of abnormal mammogram   . Hyperlipidemia   . Hypertension   . Irregular heart beat   . Neuromuscular disorder ([facility])    fibromyalgia  . Sleep apnea    cpap some  . Tubular adenoma of colon 05/09/12   "innumerable number" of flat polyps    Past Surgical History Past Surgical History:  Procedure Laterality Date  . CARPAL TUNNEL RELEASE Left 10/20/14   [facility]  . [place]  . CHOLECYSTECTOMY  1990  . COLONOSCOPY    . KNEE ARTHROSCOPY WITH SUBCHONDROPLASTY Right 03/27/2018   Procedure: Right knee arthroscopic partial medial meniscus repair versus menisectomy with medial tibia subchondroplasty;  Surgeon: Nicholes Stairs, MD;  Location: [facility];  Service: Orthopedics;  Laterality: Right;  120 mins  . LARYNX SURGERY     POLYP REMOVAL  . POLYPECTOMY     vocal cord  . TONSILLECTOMY  1965  . TUMOR REMOVAL     benign fatty tumors on abdomen    Family History Family History  Problem Relation Age  of Onset  . Prostate cancer Father        d.89 diagnosed in late-60s  . Colon cancer Father        diagnosed at 59  . Heart failure Mother        d.37  . Heart disease Mother   . Colon polyps Brother        polyposis. Underwent partial colectomy at age 37.  Marland Kitchen Spina bifida Brother        died at birth  . Stomach cancer Paternal Aunt   . Brain cancer Cousin 19       paternal first-cousin-once-removed  . Kidney cancer Cousin        d.78s maternal first-cousin  . Other Other 16       multiple lipomas  . Colon cancer Paternal Uncle        in his 52s  . Heart failure Maternal Grandmother        d.45  . Heart failure Maternal Uncle        d.50  . Colon polyps Son 54  . Heart attack Son 9  .  Esophageal cancer Neg Hx   . Rectal cancer Neg Hx      Social History  reports that she quit smoking about 48 years ago. Her smoking use included cigarettes. She has a 3.00 pack-year smoking history. She has never used smokeless tobacco. She reports that she does not drink alcohol or use drugs.  Medications  Current Outpatient Medications:  .  ALPRAZolam (XANAX) 0.25 MG tablet, Take 1 tablet (0.25 mg total) by mouth 2 (two) times daily as needed. (Patient taking differently: Take 0.25 mg by mouth 2 (two) times daily as needed for anxiety. ), Disp: 30 tablet, Rfl: 1 .  amLODipine (NORVASC) 5 MG tablet, Take 1 tablet (5 mg total) by mouth daily., Disp: 180 tablet, Rfl: 1 .  aspirin 81 MG tablet, Take 81 mg by mouth daily. , Disp: , Rfl:  .  atorvastatin (LIPITOR) 40 MG tablet, Take 1 tablet (40 mg total) by mouth daily., Disp: 90 tablet, Rfl: 1 .  benazepril (LOTENSIN) 40 MG tablet, Take 1 tablet (40 mg total) by mouth daily., Disp: 90 tablet, Rfl: 1 .  Blood Glucose Monitoring Suppl (LIBERTY BLOOD GLUCOSE MONITOR) DEVI, 1 each by Does not apply route daily. Test 1x per day and prn-dx250.02, Disp: 1 each, Rfl: 0 .  Carboxymethylcellul-Glycerin (LUBRICATING EYE DROPS OP), Place 1 drop into both  eyes daily as needed (dry eyes)., Disp: , Rfl:  .  Cholecalciferol (VITAMIN D3) 5000 units CAPS, Take 5,000 Units by mouth daily., Disp: , Rfl:  .  cyclobenzaprine (FLEXERIL) 5 MG tablet, TAKE 1 TABLET BY MOUTH THREE TIMES DAILY AS NEEDED FOR MUSCLE SPASMS (NEEDS APPOINTMENT), Disp: 30 tablet, Rfl: 0 .  furosemide (LASIX) 40 MG tablet, TAKE 1 BY MOUTH EVERY MORNING AND 1/2 BY MOUTH AT 1 IN THE AFTERNOON (Patient taking differently: Take 20-40 mg by mouth See admin instructions. Take 40 mg in the morning and take 20 to 40 mg in the afternoon (based on swelling)), Disp: 135 tablet, Rfl: 1 .  glucose blood test strip, Test 1x per day and prn- dx 250.02, Disp: 100 each, Rfl: 2 .  meclizine (ANTIVERT) 25 MG tablet, Take 1 tablet (25 mg total) by mouth 3 (three) times daily as needed for dizziness., Disp: 30 tablet, Rfl: 0 .  metFORMIN (GLUCOPHAGE) 500 MG tablet, Take 1 tablet (500 mg total) by mouth 2 (two) times daily with a meal., Disp: 180 tablet, Rfl: 1 .  omeprazole (PRILOSEC) 40 MG capsule, Take 1 capsule (40 mg total) by mouth daily., Disp: 90 capsule, Rfl: 1 .  ondansetron (ZOFRAN ODT) 4 MG disintegrating tablet, Take 1 tablet (4 mg total) by mouth every 8 (eight) hours as needed., Disp: 20 tablet, Rfl: 0 .  ONE TOUCH ULTRA TEST test strip, USE ONE STRIP TO CHECK GLUCOSE ONCE DAILY, Disp: 100 each, Rfl: 1 .  ONETOUCH DELICA LANCETS 63W MISC, USE ONE LANCET TO CHECK GLUCOSE ONCE DAILY AND AS NEEDED, Disp: 100 each, Rfl: 0 .  traMADol (ULTRAM) 50 MG tablet, Take 2 tablets (100 mg total) by mouth 3 (three) times daily. (Patient taking differently: Take 50 mg by mouth 3 (three) times daily as needed for moderate pain. ), Disp: 90 tablet, Rfl: 0 .  oxyCODONE (ROXICODONE) 5 MG immediate release tablet, Take 1-2 tablets (5-10 mg total) by mouth every 4 (four) hours as needed for severe pain or breakthrough pain. (Patient not taking: Reported on 04/02/2018), Disp: 45 tablet, Rfl:  0  Allergies Latex  Review of Systems Review of  Systems - Oncology ROS negative other than joint pain due to recent surgery   Physical Exam  Vitals Wt Readings from Last 3 Encounters:  04/02/18 222 lb (100.7 kg)  03/27/18 225 lb 11.2 oz (102.4 kg)  03/22/18 225 lb 11.2 oz (102.4 kg)   Temp Readings from Last 3 Encounters:  04/02/18 98 F (36.7 C) (Oral)  03/27/18 97.9 F (36.6 C)  03/22/18 97.8 F (36.6 C)   BP Readings from Last 3 Encounters:  04/02/18 (!) 123/59  03/27/18 (!) 120/59  03/22/18 134/60   Pulse Readings from Last 3 Encounters:  04/02/18 73  03/27/18 74  03/22/18 70   Constitutional: Well-developed, well-nourished, and in no distress.  Seated in wheelchair.   HENT: Head: Normocephalic and atraumatic.  Mouth/Throat: No oropharyngeal exudate. Mucosa moist. Eyes: Pupils are equal, round, and reactive to light. Conjunctivae are normal. No scleral icterus.  Neck: Normal range of motion. Neck supple. No JVD present.  Cardiovascular: Normal rate, regular rhythm and normal heart sounds.  Exam reveals no gallop and no friction rub.   No murmur heard. Pulmonary/Chest: Effort normal and breath sounds normal. No respiratory distress. No wheezes.No rales.  Abdominal: Soft. Bowel sounds are normal. No distension. There is no tenderness. There is no guarding.  Musculoskeletal: No edema.  Minor bruises noted on forearms from recent blood draws as well as right knee from recent surgery.   Lymphadenopathy: No cervical, axillary or supraclavicular adenopathy.  Neurological: Alert and oriented to person, place, and time. No cranial nerve deficit.  Skin: Skin is warm and dry. No rash noted. No erythema. No pallor.  Psychiatric: Affect and judgment normal.   Labs Appointment on 04/02/2018  Component Date Value Ref Range Status  . WBC 04/02/2018 4.5  4.0 - 10.5 K/uL Final  . RBC 04/02/2018 3.79* 3.87 - 5.11 MIL/uL Final  . Hemoglobin 04/02/2018 12.2  12.0 - 15.0 g/dL  Final  . HCT 04/02/2018 36.5  36.0 - 46.0 % Final  . MCV 04/02/2018 96.3  78.0 - 100.0 fL Final  . MCH 04/02/2018 32.2  26.0 - 34.0 pg Final  . MCHC 04/02/2018 33.4  30.0 - 36.0 g/dL Final  . RDW 04/02/2018 13.0  11.5 - 15.5 % Final  . Platelets 04/02/2018 98* 150 - 400 K/uL Final   Comment: PLATELET COUNT CONFIRMED BY SMEAR SPECIMEN CHECKED FOR CLOTS   . Neutrophils Relative % 04/02/2018 57  % Final  . Neutro Abs 04/02/2018 2.5  1.7 - 7.7 K/uL Final  . Lymphocytes Relative 04/02/2018 29  % Final  . Lymphs Abs 04/02/2018 1.3  0.7 - 4.0 K/uL Final  . Monocytes Relative 04/02/2018 11  % Final  . Monocytes Absolute 04/02/2018 0.5  0.1 - 1.0 K/uL Final  . Eosinophils Relative 04/02/2018 3  % Final  . Eosinophils Absolute 04/02/2018 0.1  0.0 - 0.7 K/uL Final  . Basophils Relative 04/02/2018 0  % Final  . Basophils Absolute 04/02/2018 0.0  0.0 - 0.1 K/uL Final   Performed at [facility], [address], [place], [place] [address]  . Sodium 04/02/2018 143  135 - 145 mmol/L Final  . Potassium 04/02/2018 3.7  3.5 - 5.1 mmol/L Final  . Chloride 04/02/2018 103  98 - 111 mmol/L Final  . CO2 04/02/2018 28  22 - 32 mmol/L Final  . Glucose, Bld 04/02/2018 158* 70 - 99 mg/dL Final  . BUN 04/02/2018 17  8 - 23 mg/dL Final  . Creatinine, Ser 04/02/2018 0.82  0.44 -  1.00 mg/dL Final  . Calcium 04/02/2018 9.0  8.9 - 10.3 mg/dL Final  . Total Protein 04/02/2018 5.9* 6.5 - 8.1 g/dL Final  . Albumin 04/02/2018 3.3* 3.5 - 5.0 g/dL Final  . AST 04/02/2018 35  15 - 41 U/L Final  . ALT 04/02/2018 24  0 - 44 U/L Final  . Alkaline Phosphatase 04/02/2018 86  38 - 126 U/L Final  . Total Bilirubin 04/02/2018 1.9* 0.3 - 1.2 mg/dL Final  . GFR calc non Af Amer 04/02/2018 >60  >60 mL/min Final  . GFR calc Af Amer 04/02/2018 >60  >60 mL/min Final   Comment: (NOTE) The eGFR has been calculated using the CKD EPI equation. This calculation has not been validated in all clinical situations. eGFR's persistently <60  mL/min signify possible Chronic Kidney Disease.   Georgiann Hahn gap 04/02/2018 12  5 - 15 Final   Performed at [facility], [address] [place], [place] [address]  . LDH 04/02/2018 184  98 - 192 U/L Final   Performed at [facility], [address] [place], [place] [address]  . Sed Rate 04/02/2018 37* 0 - 22 mm/hr Final   Performed at [facility], [address], [place], [place] [address]     Pathology Orders Placed This Encounter  Procedures  . CBC with Differential/Platelet    Standing Status:   Future    Number of Occurrences:   1    Standing Expiration Date:   04/03/2019  . Comprehensive metabolic panel    Standing Status:   Future    Number of Occurrences:   1    Standing Expiration Date:   04/03/2019  . Lactate dehydrogenase    Standing Status:   Future    Number of Occurrences:   1    Standing Expiration Date:   04/03/2019  . Protein electrophoresis, serum    Standing Status:   Future    Number of Occurrences:   1    Standing Expiration Date:   04/03/2019  . ANA, IFA (with reflex)    Standing Status:   Future    Number of Occurrences:   1    Standing Expiration Date:   04/03/2019  . Rheumatoid factor    Standing Status:   Future    Number of Occurrences:   1    Standing Expiration Date:   04/03/2019  . Sedimentation rate    Standing Status:   Future    Number of Occurrences:   1    Standing Expiration Date:   04/03/2019  . Pathologist smear review    Standing Status:   Future    Number of Occurrences:   1    Standing Expiration Date:   04/03/2019       Zoila Shutter MD

## 2018-04-03 LAB — PROTEIN ELECTROPHORESIS, SERUM
A/G RATIO SPE: 1.3 (ref 0.7–1.7)
ALBUMIN ELP: 3.1 g/dL (ref 2.9–4.4)
ALPHA-1-GLOBULIN: 0.3 g/dL (ref 0.0–0.4)
ALPHA-2-GLOBULIN: 0.6 g/dL (ref 0.4–1.0)
BETA GLOBULIN: 0.8 g/dL (ref 0.7–1.3)
Gamma Globulin: 0.8 g/dL (ref 0.4–1.8)
Globulin, Total: 2.4 g/dL (ref 2.2–3.9)
Total Protein ELP: 5.5 g/dL — ABNORMAL LOW (ref 6.0–8.5)

## 2018-04-03 LAB — RHEUMATOID FACTOR: Rhuematoid fact SerPl-aCnc: <10 IU/mL (ref 0.0–13.9)

## 2018-04-04 LAB — ANTINUCLEAR ANTIBODIES, IFA: ANA Ab, IFA: NEGATIVE

## 2018-04-04 LAB — PATHOLOGIST SMEAR REVIEW

## 2018-04-11 ENCOUNTER — Encounter: Payer: Self-pay | Admitting: Physical Therapy

## 2018-04-11 ENCOUNTER — Other Ambulatory Visit: Payer: Self-pay

## 2018-04-11 ENCOUNTER — Ambulatory Visit: Payer: PPO | Attending: Orthopedic Surgery | Admitting: Physical Therapy

## 2018-04-11 DIAGNOSIS — M6281 Muscle weakness (generalized): Secondary | ICD-10-CM | POA: Diagnosis not present

## 2018-04-11 DIAGNOSIS — M25561 Pain in right knee: Secondary | ICD-10-CM

## 2018-04-11 DIAGNOSIS — M25661 Stiffness of right knee, not elsewhere classified: Secondary | ICD-10-CM | POA: Insufficient documentation

## 2018-04-11 NOTE — Therapy (Signed)
[facility] [place], [place], [address] Phone: [phone]   Fax:  [phone]  Physical Therapy Evaluation  Patient Details  Name: Stacey Summers MRN: [id number] Date of Birth: 02-12-1946 Referring Provider: Victorino December MD.   Encounter Date: 04/11/2018  PT End of Session - 04/11/18 1324    Visit Number  1    Number of Visits  12    Date for PT Re-Evaluation  05/09/18    Authorization Type  FOTO AT LEAST EVERY 5TH VISIT, 10TH VISIT PROGRESS NOTE AND KX MODIFIER AFTER THE 15 VISIT.    PT Start Time  1115    PT Stop Time  1155    PT Time Calculation (min)  40 min    Activity Tolerance  Patient tolerated treatment well    Behavior During Therapy  WFL for tasks assessed/performed       Past Medical History:  Diagnosis Date  . Allergy    seasonal  . Anxiety   . Arthritis    hands, back   . Asthma    bronchitis  . Cataract    removed both eyes  . Chronic back pain    lower back   . COPD (chronic obstructive pulmonary disease) ([place])   . Diabetes mellitus without complication ([place])   . DJD (degenerative joint disease)   . Fibromyalgia   . Genetic testing 03/08/2017   Ms. Homeyer underwent genetic counseling and testing for hereditary cancer syndromes on 02/21/2017. Her results were negative for mutations in all 83 genes analyzed by [facility]'s 83-gene Multi-Cancers Panel. Genes analyzed include: ALK, APC, ATM, AXIN2, BAP1, BARD1, BLM, BMPR1A, BRCA1, BRCA2, BRIP1, CASR, CDC73, CDH1, CDK4, CDKN1B, CDKN1C, CDKN2A, CEBPA, CHEK2, CTNNA1, DICER1, DIS3L2, EGFR, EPCAM  . GERD (gastroesophageal reflux disease)   . History of abnormal mammogram   . Hyperlipidemia   . Hypertension   . Irregular heart beat   . Neuromuscular disorder (HCC)    fibromyalgia  . Sleep apnea    cpap some  . Tubular adenoma of colon 05/09/12   "innumerable number" of flat polyps    Past Surgical History:  Procedure Laterality Date  . CARPAL  TUNNEL RELEASE Left 10/20/14   [facility]  . [place]  . CHOLECYSTECTOMY  1990  . COLONOSCOPY    . KNEE ARTHROSCOPY WITH SUBCHONDROPLASTY Right 03/27/2018   Procedure: Right knee arthroscopic partial medial meniscus repair versus menisectomy with medial tibia subchondroplasty;  Surgeon: Nicholes Stairs, MD;  Location: [facility];  Service: Orthopedics;  Laterality: Right;  120 mins  . LARYNX SURGERY     POLYP REMOVAL  . POLYPECTOMY     vocal cord  . TONSILLECTOMY  1965  . TUMOR REMOVAL     benign fatty tumors on abdomen    There were no vitals filed for this visit.   Subjective Assessment - 04/11/18 1330    Subjective  The patient underwent a right knee athroscopic surgery on 03/27/18 after what she reports was an intense episode of back pain that caused her right knee to buckle and a subsquent fall.  Her surgery also included a medial tibia sub chondroplasty.  She had her stitches removed yesterday and she states her surgeon was "amazed" at how well she was doing.  She presented to the clinic today walking with a straight cane and wbat over her right LE.  "Doing too much" increases her pain and "taking it slow and easy" decreases her pain.  her pain-level  today is rated at a 6/10.    Pertinent History  DJD; Osteopenia; chronic back pain; COPD; Fibromyalgia and irregular heart beat.    Limitations  Walking    How long can you walk comfortably?  Short distances in community.    Patient Stated Goals  Get back to normal.  "I have a lot to do."    Currently in Pain?  Yes    Pain Score  6     Pain Location  Knee    Pain Orientation  Right    Pain Descriptors / Indicators  Aching    Pain Type  Surgical pain    Pain Onset  1 to 4 weeks ago    Pain Frequency  Constant    Aggravating Factors   See above.    Pain Relieving Factors  See above.         [facility] PT Assessment - 04/11/18 0001      Assessment   Medical Diagnosis  Right knee scope/medial sub chondroplasty.     Referring Provider  Victorino December MD.    Onset Date/Surgical Date  --   03/27/18(surgery date).     Precautions   Precautions  --   Pain-free right quad strengthening.     Restrictions   Weight Bearing Restrictions  No      Balance Screen   Has the patient fallen in the past 6 months  No    Has the patient had a decrease in activity level because of a fear of falling?   Yes    Is the patient reluctant to leave their home because of a fear of falling?   No      Home Environment   Living Environment  Private residence      Prior Function   Level of Independence  Independent      Observation/Other Assessments   Focus on Therapeutic Outcomes (FOTO)   74% limitation.      ROM / Strength   AROM / PROM / Strength  AROM;Strength      AROM   Overall AROM Comments  Right knee -7 to 103 degrees      Strength   Overall Strength Comments  Right hip flexion/abduction and right knee strength= 4/5.      Palpation   Palpation comment  Tender to palpation around right knee scope sites.      Special Tests   Other special tests  Minimal right knee edema.      Ambulation/Gait   Gait Comments  The patient is walking safely with a straight cane with a decrease in step and stride length.                Objective measurements completed on examination: See above findings.      [facility] Adult PT Treatment/Exercise - 04/11/18 0001      Modalities   Modalities  Electrical Stimulation;Vasopneumatic      Electrical Stimulation   Electrical Stimulation Location  Right knee.    Electrical Stimulation Action  IFC    Electrical Stimulation Parameters  1-10 Hz x 15 minutes.    Electrical Stimulation Goals  Edema;Pain      Vasopneumatic   Number Minutes Vasopneumatic   15 minutes    Vasopnuematic Location   --   Right knee.   Vasopneumatic Pressure  Low               PT Short Term Goals - 04/11/18 1400      PT  SHORT TERM GOAL #1   Title  Independent with an initial HEP.       PT SHORT TERM GOAL #2   Title  Full active right knee extension.    Time  2    Period  Weeks    Status  New        PT Long Term Goals - 04/11/18 1409      PT LONG TERM GOAL #1   Title  Independent with an advanced HEP.    Time  4    Period  Weeks    Status  New      PT LONG TERM GOAL #2   Title  Active right knee flexion to 115 degrees+ so the patient can perform functional tasks and do so with pain not > 2-3/10.    Time  4    Period  Weeks    Status  New      PT LONG TERM GOAL #3   Title  Increase right knee strength to a solid 4+/5 to provide good stability for accomplishment of functional activities.    Time  4    Period  Weeks    Status  New      PT LONG TERM GOAL #4   Title  Perform a reciprocating stair gait with one railing with pain not > 2-3/10.    Time  4    Period  Weeks    Status  New      PT LONG TERM GOAL #5   Title  Perform ADL's with pain not > 2/10.    Time  4    Period  Weeks    Status  New             Plan - 04/11/18 1351    Clinical Impression Statement  The patient presents to OPPT s/p right knee arthroscopic surgery that included a medial tibia sub chondroplasty performed on 03/27/18.  She does lack some range of motion and strength but overall is doing very well.  She is walking with a straight cane wbat over her right LE.  Her pain and deficits have impaired her functional mobility. Patient will benefit from skilled physical therapy intervention to address deficits.     History and Personal Factors relevant to plan of care:  DJD; Osteopenia; chronic back pain; COPD; Fibromyalgia and irregular heart beat.    Clinical Presentation  Stable    Clinical Presentation due to:  Good surgical outcome.    Clinical Decision Making  Low    Rehab Potential  Excellent    PT Frequency  3x / week    PT Duration  4 weeks    PT Treatment/Interventions  ADLs/Self Care Home Management;Cryotherapy;Electrical Stimulation;Ultrasound;Moist Heat;Gait  training;Stair training;Functional mobility training;Therapeutic activities;Therapeutic exercise;Neuromuscular re-education;Patient/family education;Passive range of motion;Manual techniques;Vasopneumatic Device    PT Next Visit Plan  Nustep; Pain-free right quad strengthening; vaso and e'stim.    Consulted and Agree with Plan of Care  Patient       Patient will benefit from skilled therapeutic intervention in order to improve the following deficits and impairments:  Abnormal gait, Pain, Decreased activity tolerance, Decreased range of motion, Decreased strength  Visit Diagnosis: Acute pain of right knee - Plan: PT plan of care cert/re-cert  Stiffness of right knee, not elsewhere classified - Plan: PT plan of care cert/re-cert  Muscle weakness (generalized) - Plan: PT plan of care cert/re-cert     Problem List Patient Active Problem List   Diagnosis  Date Noted  . Complex tear of medial meniscus of right knee 03/27/2018  . Stress fracture of right tibia 03/27/2018  . Chronic pain of right knee 12/19/2017  . Genetic testing 03/08/2017  . Restrictive lung disease secondary to obesity 08/04/2015  . Physical deconditioning 08/04/2015  . OSA treated with BiPAP 08/04/2015  . Gastroesophageal reflux disease without esophagitis 12/25/2014  . Low serum vitamin D 05/28/2014  . Osteopenia 05/28/2014  . COPD (chronic obstructive pulmonary disease) ([place]) 01/29/2014  . Dyspnea on exertion 01/07/2014  . Fatigue 2013-03-03  . Murmur, cardiac 2013/03/03  . Diabetes ([place]) 03-03-13  . Morbid obesity ([place]) 03/03/13  . Family history of sudden death 03-Mar-2013  . Hyperlipidemia March 03, 2013  . Hypertension 12/20/2012  . GAD (generalized anxiety disorder) 12/20/2012  . Fibromyalgia muscle pain 12/20/2012    Essex Perry, [place] MPT 04/11/2018, 2:14 PM  [facility] [address] [place], [place], [address] Phone: [phone]   Fax:   [phone]  Name: JACELYNN HAYTON MRN: [id number] Date of Birth: [birth year]

## 2018-04-17 ENCOUNTER — Ambulatory Visit: Payer: PPO | Attending: Orthopedic Surgery | Admitting: *Deleted

## 2018-04-17 DIAGNOSIS — M25661 Stiffness of right knee, not elsewhere classified: Secondary | ICD-10-CM | POA: Diagnosis not present

## 2018-04-17 DIAGNOSIS — M25561 Pain in right knee: Secondary | ICD-10-CM | POA: Diagnosis not present

## 2018-04-17 DIAGNOSIS — M6281 Muscle weakness (generalized): Secondary | ICD-10-CM | POA: Diagnosis not present

## 2018-04-17 NOTE — Therapy (Signed)
[facility] [place], [place], [address] Phone: [phone]   Fax:  [phone]  Physical Therapy Treatment  Patient Details  Name: Stacey Summers MRN: [id number] Date of Birth: Jun 30, 1946 Referring Provider: Victorino December MD.   Encounter Date: 04/17/2018  PT End of Session - 04/17/18 1128    Visit Number  2    Number of Visits  12    Date for PT Re-Evaluation  05/09/18    Authorization Type  FOTO AT LEAST EVERY 5TH VISIT, 10TH VISIT PROGRESS NOTE AND KX MODIFIER AFTER THE 15 VISIT.    PT Start Time  1115       Past Medical History:  Diagnosis Date  . Allergy    seasonal  . Anxiety   . Arthritis    hands, back   . Asthma    bronchitis  . Cataract    removed both eyes  . Chronic back pain    lower back   . COPD (chronic obstructive pulmonary disease) ([place])   . Diabetes mellitus without complication ([place])   . DJD (degenerative joint disease)   . Fibromyalgia   . Genetic testing 03/08/2017   Ms. Bonadonna underwent genetic counseling and testing for hereditary cancer syndromes on 02/21/2017. Her results were negative for mutations in all 83 genes analyzed by [facility]'s 83-gene Multi-Cancers Panel. Genes analyzed include: ALK, APC, ATM, AXIN2, BAP1, BARD1, BLM, BMPR1A, BRCA1, BRCA2, BRIP1, CASR, CDC73, CDH1, CDK4, CDKN1B, CDKN1C, CDKN2A, CEBPA, CHEK2, CTNNA1, DICER1, DIS3L2, EGFR, EPCAM  . GERD (gastroesophageal reflux disease)   . History of abnormal mammogram   . Hyperlipidemia   . Hypertension   . Irregular heart beat   . Neuromuscular disorder (HCC)    fibromyalgia  . Sleep apnea    cpap some  . Tubular adenoma of colon 05/09/12   "innumerable number" of flat polyps    Past Surgical History:  Procedure Laterality Date  . CARPAL TUNNEL RELEASE Left 10/20/14   [facility]  . [place]  . CHOLECYSTECTOMY  1990  . COLONOSCOPY    . KNEE ARTHROSCOPY WITH SUBCHONDROPLASTY Right 03/27/2018   Procedure:  Right knee arthroscopic partial medial meniscus repair versus menisectomy with medial tibia subchondroplasty;  Surgeon: Nicholes Stairs, MD;  Location: [facility];  Service: Orthopedics;  Laterality: Right;  120 mins  . LARYNX SURGERY     POLYP REMOVAL  . POLYPECTOMY     vocal cord  . TONSILLECTOMY  1965  . TUMOR REMOVAL     benign fatty tumors on abdomen    There were no vitals filed for this visit.  Subjective Assessment - 04/17/18 1325    Subjective  Did ok after Eval. RT knee continues to hurt. 6/10    Pertinent History  DJD; Osteopenia; chronic back pain; COPD; Fibromyalgia and irregular heart beat.    Limitations  Walking    How long can you walk comfortably?  Short distances in community.    Patient Stated Goals  Get back to normal.  "I have a lot to do."    Currently in Pain?  Yes    Pain Score  6     Pain Location  Knee    Pain Orientation  Right    Pain Descriptors / Indicators  Aching    Pain Type  Surgical pain    Pain Onset  1 to 4 weeks ago    Pain Frequency  Constant  [place] Adult PT Treatment/Exercise - 04/17/18 0001      Ambulation/Gait   Gait Comments  The patient is walking safely with a straight cane with a decrease in step and stride length.      Exercises   Exercises  Knee/Hip      Knee/Hip Exercises: Aerobic   Nustep  L3 seat 8 x 11 mins      Knee/Hip Exercises: Seated   Long Arc Quad  Right;2 sets;10 reps   pause at top     Knee/Hip Exercises: Supine   Short Arc Quad Sets  AROM;Right;2 sets;10 reps    Knee Extension  Right    Knee Extension Limitations  low load stretch x 5 mins with heel on pillow      Modalities   Modalities  Electrical Stimulation;Vasopneumatic      Electrical Stimulation   Electrical Stimulation Location  Right knee.  IFC x 15 mins 1-10hz     Electrical Stimulation Goals  Edema;Pain      Vasopneumatic   Number Minutes Vasopneumatic   15 minutes    Vasopnuematic Location   Knee     Vasopneumatic Pressure  Low    Vasopneumatic Temperature   36               PT Short Term Goals - 04/11/18 1400      PT SHORT TERM GOAL #1   Title  Independent with an initial HEP.      PT SHORT TERM GOAL #2   Title  Full active right knee extension.    Time  2    Period  Weeks    Status  New        PT Long Term Goals - 04/11/18 1409      PT LONG TERM GOAL #1   Title  Independent with an advanced HEP.    Time  4    Period  Weeks    Status  New      PT LONG TERM GOAL #2   Title  Active right knee flexion to 115 degrees+ so the patient can perform functional tasks and do so with pain not > 2-3/10.    Time  4    Period  Weeks    Status  New      PT LONG TERM GOAL #3   Title  Increase right knee strength to a solid 4+/5 to provide good stability for accomplishment of functional activities.    Time  4    Period  Weeks    Status  New      PT LONG TERM GOAL #4   Title  Perform a reciprocating stair gait with one railing with pain not > 2-3/10.    Time  4    Period  Weeks    Status  New      PT LONG TERM GOAL #5   Title  Perform ADL's with pain not > 2/10.    Time  4    Period  Weeks    Status  New            Plan - 04/17/18 1129    Clinical Presentation  Stable    Rehab Potential  Excellent    PT Frequency  3x / week    PT Duration  4 weeks    PT Treatment/Interventions  ADLs/Self Care Home Management;Cryotherapy;Electrical Stimulation;Ultrasound;Moist Heat;Gait training;Stair training;Functional mobility training;Therapeutic activities;Therapeutic exercise;Neuromuscular re-education;Patient/family education;Passive range of motion;Manual techniques;Vasopneumatic Device    PT Next Visit Plan  Nustep; Pain-free right quad strengthening; vaso and e'stim.       Patient will benefit from skilled therapeutic intervention in order to improve the following deficits and impairments:  Abnormal gait, Pain, Decreased activity tolerance, Decreased range of  motion, Decreased strength  Visit Diagnosis: Acute pain of right knee  Stiffness of right knee, not elsewhere classified  Muscle weakness (generalized)     Problem List Patient Active Problem List   Diagnosis Date Noted  . Complex tear of medial meniscus of right knee 03/27/2018  . Stress fracture of right tibia 03/27/2018  . Chronic pain of right knee 12/19/2017  . Genetic testing 03/08/2017  . Restrictive lung disease secondary to obesity 08/04/2015  . Physical deconditioning 08/04/2015  . OSA treated with BiPAP 08/04/2015  . Gastroesophageal reflux disease without esophagitis 12/25/2014  . Low serum vitamin D 05/28/2014  . Osteopenia 05/28/2014  . COPD (chronic obstructive pulmonary disease) ([place]) 01/29/2014  . Dyspnea on exertion 01/07/2014  . Fatigue Mar 24, 2013  . Murmur, cardiac 03-24-13  . Diabetes ([place]) 03-24-2013  . Morbid obesity ([place]) 03-24-2013  . Family history of sudden death Mar 24, 2013  . Hyperlipidemia 03/24/2013  . Hypertension 12/20/2012  . GAD (generalized anxiety disorder) 12/20/2012  . Fibromyalgia muscle pain 12/20/2012    Kamber Vignola,CHRIS, PTA 04/17/2018, 1:36 PM  [facility] [place], [place], [address] Phone: [phone]   Fax:  [phone]  Name: Stacey Summers MRN: [id number] Date of Birth: April 12, 1946

## 2018-04-19 ENCOUNTER — Ambulatory Visit: Payer: PPO | Admitting: *Deleted

## 2018-04-19 DIAGNOSIS — M6281 Muscle weakness (generalized): Secondary | ICD-10-CM

## 2018-04-19 DIAGNOSIS — M25561 Pain in right knee: Secondary | ICD-10-CM | POA: Diagnosis not present

## 2018-04-19 DIAGNOSIS — M25661 Stiffness of right knee, not elsewhere classified: Secondary | ICD-10-CM

## 2018-04-19 NOTE — Therapy (Signed)
[facility] [place], [place], [address] Phone: [phone]   Fax:  [phone]  Physical Therapy Treatment  Patient Details  Name: Stacey Summers MRN: [id number] Date of Birth: [birth year] Referring Provider: Victorino December MD.   Encounter Date: 04/19/2018  PT End of Session - 04/19/18 1208    Visit Number  3    Number of Visits  12    Date for PT Re-Evaluation  05/09/18    Authorization Type  FOTO AT LEAST EVERY 5TH VISIT, 10TH VISIT PROGRESS NOTE AND KX MODIFIER AFTER THE 15 VISIT.    PT Start Time  1115    PT Stop Time  1209    PT Time Calculation (min)  54 min       Past Medical History:  Diagnosis Date  . Allergy    seasonal  . Anxiety   . Arthritis    hands, back   . Asthma    bronchitis  . Cataract    removed both eyes  . Chronic back pain    lower back   . COPD (chronic obstructive pulmonary disease) ([place])   . Diabetes mellitus without complication ([place])   . DJD (degenerative joint disease)   . Fibromyalgia   . Genetic testing 03/08/2017   Ms. Gucciardo underwent genetic counseling and testing for hereditary cancer syndromes on 02/21/2017. Her results were negative for mutations in all 83 genes analyzed by [facility]'s 83-gene Multi-Cancers Panel. Genes analyzed include: ALK, APC, ATM, AXIN2, BAP1, BARD1, BLM, BMPR1A, BRCA1, BRCA2, BRIP1, CASR, CDC73, CDH1, CDK4, CDKN1B, CDKN1C, CDKN2A, CEBPA, CHEK2, CTNNA1, DICER1, DIS3L2, EGFR, EPCAM  . GERD (gastroesophageal reflux disease)   . History of abnormal mammogram   . Hyperlipidemia   . Hypertension   . Irregular heart beat   . Neuromuscular disorder (HCC)    fibromyalgia  . Sleep apnea    cpap some  . Tubular adenoma of colon 05/09/12   "innumerable number" of flat polyps    Past Surgical History:  Procedure Laterality Date  . CARPAL TUNNEL RELEASE Left 10/20/14   [facility]  . [place]  . CHOLECYSTECTOMY  1990  . COLONOSCOPY    . KNEE  ARTHROSCOPY WITH SUBCHONDROPLASTY Right 03/27/2018   Procedure: Right knee arthroscopic partial medial meniscus repair versus menisectomy with medial tibia subchondroplasty;  Surgeon: Nicholes Stairs, MD;  Location: [facility];  Service: Orthopedics;  Laterality: Right;  120 mins  . LARYNX SURGERY     POLYP REMOVAL  . POLYPECTOMY     vocal cord  . TONSILLECTOMY  1965  . TUMOR REMOVAL     benign fatty tumors on abdomen    There were no vitals filed for this visit.                    [facility] Adult PT Treatment/Exercise - 04/19/18 0001      Ambulation/Gait   Gait Comments  The patient is walking safely with a straight cane with a decrease in step and stride length.      Exercises   Exercises  Knee/Hip      Knee/Hip Exercises: Aerobic   Nustep  L3 seat 8 x 16 mins      Knee/Hip Exercises: Seated   Long Arc Quad  Right;2 sets;10 reps   pause at top   [employer] Weight  2 lbs.      Knee/Hip Exercises: Supine   Short Arc Quad Sets  AROM;Right;2 sets;10 reps  2#     Modalities   Modalities  Health visitor Stimulation Location  Right knee.  IFC x 15 mins 1-10hz     Electrical Stimulation Goals  Edema;Pain      Vasopneumatic   Number Minutes Vasopneumatic   15 minutes    Vasopnuematic Location   Knee    Vasopneumatic Pressure  Low    Vasopneumatic Temperature   36               PT Short Term Goals - 04/11/18 1400      PT SHORT TERM GOAL #1   Title  Independent with an initial HEP.      PT SHORT TERM GOAL #2   Title  Full active right knee extension.    Time  2    Period  Weeks    Status  New        PT Long Term Goals - 04/11/18 1409      PT LONG TERM GOAL #1   Title  Independent with an advanced HEP.    Time  4    Period  Weeks    Status  New      PT LONG TERM GOAL #2   Title  Active right knee flexion to 115 degrees+ so the patient can perform functional tasks and do  so with pain not > 2-3/10.    Time  4    Period  Weeks    Status  New      PT LONG TERM GOAL #3   Title  Increase right knee strength to a solid 4+/5 to provide good stability for accomplishment of functional activities.    Time  4    Period  Weeks    Status  New      PT LONG TERM GOAL #4   Title  Perform a reciprocating stair gait with one railing with pain not > 2-3/10.    Time  4    Period  Weeks    Status  New      PT LONG TERM GOAL #5   Title  Perform ADL's with pain not > 2/10.    Time  4    Period  Weeks    Status  New            Plan - 04/19/18 1139    Clinical Impression Statement  Pt arrived today doing fair with RT knee pain. She reports RT knee is painful and tight. She was able to perform Nustep ,  LAQs, and SAQs with light resistance todayand did fairly well, but continues to have some sciatic pain during exs. Normal modalities response today.     Clinical Presentation  Stable    Rehab Potential  Excellent    PT Frequency  3x / week    PT Duration  4 weeks    PT Treatment/Interventions  ADLs/Self Care Home Management;Cryotherapy;Electrical Stimulation;Ultrasound;Moist Heat;Gait training;Stair training;Functional mobility training;Therapeutic activities;Therapeutic exercise;Neuromuscular re-education;Patient/family education;Passive range of motion;Manual techniques;Vasopneumatic Device    PT Next Visit Plan  Nustep; Pain-free right quad strengthening; vaso and e'stim.    Consulted and Agree with Plan of Care  Patient       Patient will benefit from skilled therapeutic intervention in order to improve the following deficits and impairments:  Abnormal gait, Pain, Decreased activity tolerance, Decreased range of motion, Decreased strength  Visit Diagnosis: Acute pain of right knee  Stiffness of right knee, not elsewhere  classified  Muscle weakness (generalized)     Problem List Patient Active Problem List   Diagnosis Date Noted  . Complex tear of  medial meniscus of right knee 03/27/2018  . Stress fracture of right tibia 03/27/2018  . Chronic pain of right knee 12/19/2017  . Genetic testing 03/08/2017  . Restrictive lung disease secondary to obesity 08/04/2015  . Physical deconditioning 08/04/2015  . OSA treated with BiPAP 08/04/2015  . Gastroesophageal reflux disease without esophagitis 12/25/2014  . Low serum vitamin D 05/28/2014  . Osteopenia 05/28/2014  . COPD (chronic obstructive pulmonary disease) ([place]) 01/29/2014  . Dyspnea on exertion 01/07/2014  . Fatigue March 24, 2013  . Murmur, cardiac 03/24/2013  . Diabetes ([place]) 03/24/13  . Morbid obesity ([place]) 03-24-2013  . Family history of sudden death 2013/03/24  . Hyperlipidemia Mar 24, 2013  . Hypertension 12/20/2012  . GAD (generalized anxiety disorder) 12/20/2012  . Fibromyalgia muscle pain 12/20/2012    Refugia Laneve,CHRIS, PTA 04/19/2018, 12:12 PM  [facility] [address] [place], [place], [address] Phone: [phone]   Fax:  [phone]  Name: Stacey Summers MRN: [id number] Date of Birth: [birth year]

## 2018-04-20 ENCOUNTER — Inpatient Hospital Stay (HOSPITAL_COMMUNITY): Payer: PPO | Attending: Internal Medicine | Admitting: Internal Medicine

## 2018-04-20 ENCOUNTER — Encounter (HOSPITAL_COMMUNITY): Payer: Self-pay | Admitting: Internal Medicine

## 2018-04-20 VITALS — BP 154/70 | HR 72 | Temp 98.1°F | Resp 16 | Wt 222.9 lb

## 2018-04-20 DIAGNOSIS — Z87891 Personal history of nicotine dependence: Secondary | ICD-10-CM | POA: Insufficient documentation

## 2018-04-20 DIAGNOSIS — J984 Other disorders of lung: Secondary | ICD-10-CM | POA: Diagnosis not present

## 2018-04-20 DIAGNOSIS — E785 Hyperlipidemia, unspecified: Secondary | ICD-10-CM | POA: Insufficient documentation

## 2018-04-20 DIAGNOSIS — M2550 Pain in unspecified joint: Secondary | ICD-10-CM | POA: Diagnosis not present

## 2018-04-20 DIAGNOSIS — Z7984 Long term (current) use of oral hypoglycemic drugs: Secondary | ICD-10-CM | POA: Diagnosis not present

## 2018-04-20 DIAGNOSIS — E669 Obesity, unspecified: Secondary | ICD-10-CM | POA: Diagnosis not present

## 2018-04-20 DIAGNOSIS — M8580 Other specified disorders of bone density and structure, unspecified site: Secondary | ICD-10-CM | POA: Diagnosis not present

## 2018-04-20 DIAGNOSIS — G4730 Sleep apnea, unspecified: Secondary | ICD-10-CM | POA: Insufficient documentation

## 2018-04-20 DIAGNOSIS — K219 Gastro-esophageal reflux disease without esophagitis: Secondary | ICD-10-CM | POA: Insufficient documentation

## 2018-04-20 DIAGNOSIS — Z7982 Long term (current) use of aspirin: Secondary | ICD-10-CM | POA: Insufficient documentation

## 2018-04-20 DIAGNOSIS — R011 Cardiac murmur, unspecified: Secondary | ICD-10-CM | POA: Diagnosis not present

## 2018-04-20 DIAGNOSIS — D696 Thrombocytopenia, unspecified: Secondary | ICD-10-CM | POA: Insufficient documentation

## 2018-04-20 DIAGNOSIS — Z800 Family history of malignant neoplasm of digestive organs: Secondary | ICD-10-CM | POA: Diagnosis not present

## 2018-04-20 DIAGNOSIS — M797 Fibromyalgia: Secondary | ICD-10-CM | POA: Diagnosis not present

## 2018-04-20 DIAGNOSIS — E119 Type 2 diabetes mellitus without complications: Secondary | ICD-10-CM | POA: Insufficient documentation

## 2018-04-20 DIAGNOSIS — I10 Essential (primary) hypertension: Secondary | ICD-10-CM | POA: Diagnosis not present

## 2018-04-20 DIAGNOSIS — Z79899 Other long term (current) drug therapy: Secondary | ICD-10-CM | POA: Insufficient documentation

## 2018-04-20 DIAGNOSIS — J449 Chronic obstructive pulmonary disease, unspecified: Secondary | ICD-10-CM | POA: Insufficient documentation

## 2018-04-20 DIAGNOSIS — R5381 Other malaise: Secondary | ICD-10-CM | POA: Diagnosis not present

## 2018-04-20 DIAGNOSIS — R5383 Other fatigue: Secondary | ICD-10-CM | POA: Diagnosis not present

## 2018-04-20 NOTE — Patient Instructions (Signed)
[place] at [facility] Discharge Instructions  You saw Dr. Walden Field today.   Thank you for choosing [place] at [facility] to provide your oncology and hematology care.  To afford each patient quality time with our provider, please arrive at least 15 minutes before your scheduled appointment time.   If you have a lab appointment with the [facility] please come in thru the  Main Entrance and check in at the main information desk  You need to re-schedule your appointment should you arrive 10 or more minutes late.  We strive to give you quality time with our providers, and arriving late affects you and other patients whose appointments are after yours.  Also, if you no show three or more times for appointments you may be dismissed from the clinic at the providers discretion.     Again, thank you for choosing [facility].  Our hope is that these requests will decrease the amount of time that you wait before being seen by our physicians.       _____________________________________________________________  Should you have questions after your visit to [facility], please contact our office at [phone] between the hours of 8:00 a.m. and 4:30 p.m.  Voicemails left after 4:00 p.m. will not be returned until the following business day.  For prescription refill requests, have your pharmacy contact our office and allow 72 hours.    Cancer Center Support Programs:   > Cancer Support Group  2nd Tuesday of the month 1pm-2pm, Journey Room

## 2018-04-20 NOTE — Progress Notes (Signed)
Diagnosis Thrombocytopenia ([place]) - Plan: CBC with Differential/Platelet, Comprehensive metabolic panel, Lactate dehydrogenase  Staging Cancer Staging No matching staging information was found for the patient.  Assessment and Plan:  1.  Thrombocytopenia.  Labs done 04/02/2018 reviewed and showed a white count 4.5 hemoglobin 12.2 platelets 98,000 she has a normal differential.  No fragmentation noted.  Chemistries within normal limits with a potassium of 3.7 creatinine 0.83 AST 35 ALT 24 bilirubin 1.9.  Sed rate is elevated at 37.  LDH is normal at 184. Coagulation studies done 03/27/2018 showed a PT 14.5 PTT 38.  ANA negative.  Path review of smear negative.  Will continue to  Monitor labs and will consider bone marrow biopsy if no improvement in counts.  Will consider CT for further evaluation of liver and spleen if counts fail to improve.   2.  Joint pain.  She has negative SPEP, C-reactive protein, ANA, rheumatoid factor.  She has an elevated sed rate of 37.  She had recent right meniscus surgery done in August 2019.  Pt has fibromyalgia.  Follow-up with PCP.  Pt should continue to work with PT as recommended.    3.  Elevated Bilirubin.  Pt labs show elevated bilirubin dating back to 2017.  Will consider abdominal imaging for further evaluation of liver, gallbladder and spleen especially if plt count remains decreased.    4.  Family history of colon cancer.  She reports this occurred in her father.  Patient reports multiple polyps on colonoscopy evaluation and she has frequent exams due to this.  She had genetic testing done that was negative.  5.  Hypertension.  Blood pressure is 154/70.  Follow-up with PCP.  6.  Health maintenance.  Mammogram done October 06/03/2017 that was negative.  Colonoscopy done 12/06/2016 showed several polyps that were negative for malignancy.  Patient has undergone genetic testing that was negative.Continue  Mammogram and GI follow-up as recommended.    Interval  history:  Historical data obtained from note dated 04/02/2018.  72 year old female referred for evaluation due to thrombocytopenia.  She had recent surgery on her right knee.  She denies any bleeding with the surgery.  She denied nonsteroidal use.  She reports a family history of colon cancer in her father..  She has frequent GI evaluations due to showing evidence of multiple polyps.  Review of chart shows labs done March 21, 2018 showed a white count of 3.8 hemoglobin 12.8 platelets 84,000 MCV was 101.  She had a normal differential.  Chemistries within normal limits other than an elevated bilirubin.  She reports she bruises easily.  She denies any chills, night sweats, and has noted no adenopathy.  She reported low-grade temperature after surgery.   Current Status:  Pt is seen today for follow-up.  She reports he has some knee stiffness related to recent knee surgery but she is working with PT.    Problem List Patient Active Problem List   Diagnosis Date Noted  . Complex tear of medial meniscus of right knee [S83.231A] 03/27/2018  . Stress fracture of right tibia [K02.542H] 03/27/2018  . Chronic pain of right knee [M25.561, G89.29] 12/19/2017  . Genetic testing [Z13.79] 03/08/2017  . 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 Physical deconditioning [R53.81] 08/04/2015  . OSA treated with BiPAP [G47.33] 08/04/2015  . Gastroesophageal reflux disease without esophagitis [K21.9] 12/25/2014  . Low serum vitamin D [R79.89] 05/28/2014  . Osteopenia [M85.80] 05/28/2014  . COPD (chronic obstructive pulmonary disease) ([place]) Abril.Kenning.9]  01/29/2014  . Dyspnea on exertion [R06.09] 01/07/2014  . Fatigue [R53.83] 2013/03/22  . Murmur, cardiac [R01.1] 03/22/2013  . Diabetes ([place]) [E11.9] 03/22/13  . Morbid obesity ([place]) [E66.01] 03-22-13  . Family history of sudden death [Z84.89] March 22, 2013  . Hyperlipidemia [E78.5] Mar 22, 2013  . Hypertension [I10] 12/20/2012  . GAD  (generalized anxiety disorder) [F41.1] 12/20/2012  . Fibromyalgia muscle pain [M79.7] 12/20/2012    Past Medical History Past Medical History:  Diagnosis Date  . Allergy    seasonal  . Anxiety   . Arthritis    hands, back   . Asthma    bronchitis  . Cataract    removed both eyes  . Chronic back pain    lower back   . COPD (chronic obstructive pulmonary disease) ([place])   . Diabetes mellitus without complication ([place])   . DJD (degenerative joint disease)   . Fibromyalgia   . Genetic testing 03/08/2017   Ms. Knoles underwent genetic counseling and testing for hereditary cancer syndromes on 02/21/2017. Her results were negative for mutations in all 83 genes analyzed by [facility]'s 83-gene Multi-Cancers Panel. Genes analyzed include: ALK, APC, ATM, AXIN2, BAP1, BARD1, BLM, BMPR1A, BRCA1, BRCA2, BRIP1, CASR, CDC73, CDH1, CDK4, CDKN1B, CDKN1C, CDKN2A, CEBPA, CHEK2, CTNNA1, DICER1, DIS3L2, EGFR, EPCAM  . GERD (gastroesophageal reflux disease)   . History of abnormal mammogram   . Hyperlipidemia   . Hypertension   . Irregular heart beat   . Neuromuscular disorder (H[facility])    fibromyalgia  . Sleep apnea    cpap some  . Tubular adenoma of colon 05/09/12   "innumerable number" of flat polyps    Past Surgical History Past Surgical History:  Procedure Laterality Date  . CARPAL TUNNEL RELEASE Left 10/20/14   [facility]  . [place]  . CHOLECYSTECTOMY  1990  . COLONOSCOPY    . KNEE ARTHROSCOPY WITH SUBCHONDROPLASTY Right 03/27/2018   Procedure: Right knee arthroscopic partial medial meniscus repair versus menisectomy with medial tibia subchondroplasty;  Surgeon: Nicholes Stairs, MD;  Location: [facility];  Service: Orthopedics;  Laterality: Right;  120 mins  . LARYNX SURGERY     POLYP REMOVAL  . POLYPECTOMY     vocal cord  . TONSILLECTOMY  1965  . TUMOR REMOVAL     benign fatty tumors on abdomen    Family History Family History  Problem Relation Age of Onset  .  Prostate cancer Father        d.89 diagnosed in late-60s  . Colon cancer Father        diagnosed at 43  . Heart failure Mother        d.37  . Heart disease Mother   . Colon polyps Brother        polyposis. Underwent partial colectomy at age 29.  Marland Kitchen Spina bifida Brother        died at birth  . Stomach cancer Paternal Aunt   . Brain cancer Cousin 19       paternal first-cousin-once-removed  . Kidney cancer Cousin        d.78s maternal first-cousin  . Other Other 16       multiple lipomas  . Colon cancer Paternal Uncle        in his 39s  . Heart failure Maternal Grandmother        d.45  . Heart failure Maternal Uncle        d.50  . Colon polyps Son 72  . Heart attack Son  50  . Esophageal cancer Neg Hx   . Rectal cancer Neg Hx      Social History  reports that she quit smoking about 48 years ago. Her smoking use included cigarettes. She has a 3.00 pack-year smoking history. She has never used smokeless tobacco. She reports that she does not drink alcohol or use drugs.  Medications  Current Outpatient Medications:  .  ALPRAZolam (XANAX) 0.25 MG tablet, Take 1 tablet (0.25 mg total) by mouth 2 (two) times daily as needed. (Patient taking differently: Take 0.25 mg by mouth 2 (two) times daily as needed for anxiety. ), Disp: 30 tablet, Rfl: 1 .  amLODipine (NORVASC) 5 MG tablet, Take 1 tablet (5 mg total) by mouth daily., Disp: 180 tablet, Rfl: 1 .  aspirin 81 MG tablet, Take 81 mg by mouth daily. , Disp: , Rfl:  .  atorvastatin (LIPITOR) 40 MG tablet, Take 1 tablet (40 mg total) by mouth daily., Disp: 90 tablet, Rfl: 1 .  benazepril (LOTENSIN) 40 MG tablet, Take 1 tablet (40 mg total) by mouth daily., Disp: 90 tablet, Rfl: 1 .  Blood Glucose Monitoring Suppl (LIBERTY BLOOD GLUCOSE MONITOR) DEVI, 1 each by Does not apply route daily. Test 1x per day and prn-dx250.02, Disp: 1 each, Rfl: 0 .  Carboxymethylcellul-Glycerin (LUBRICATING EYE DROPS OP), Place 1 drop into both eyes daily as  needed (dry eyes)., Disp: , Rfl:  .  Cholecalciferol (VITAMIN D3) 5000 units CAPS, Take 5,000 Units by mouth daily., Disp: , Rfl:  .  cyclobenzaprine (FLEXERIL) 5 MG tablet, TAKE 1 TABLET BY MOUTH THREE TIMES DAILY AS NEEDED FOR MUSCLE SPASMS (NEEDS APPOINTMENT), Disp: 30 tablet, Rfl: 0 .  furosemide (LASIX) 40 MG tablet, TAKE 1 BY MOUTH EVERY MORNING AND 1/2 BY MOUTH AT 1 IN THE AFTERNOON (Patient taking differently: Take 20-40 mg by mouth See admin instructions. Take 40 mg in the morning and take 20 to 40 mg in the afternoon (based on swelling)), Disp: 135 tablet, Rfl: 1 .  glucose blood test strip, Test 1x per day and prn- dx 250.02, Disp: 100 each, Rfl: 2 .  HYDROcodone-acetaminophen (NORCO) 5-325 MG tablet, Norco 5 mg-325 mg tablet  Take 1 tablet every 6 hours by oral route., Disp: , Rfl:  .  meclizine (ANTIVERT) 25 MG tablet, Take 1 tablet (25 mg total) by mouth 3 (three) times daily as needed for dizziness., Disp: 30 tablet, Rfl: 0 .  metFORMIN (GLUCOPHAGE) 500 MG tablet, Take 1 tablet (500 mg total) by mouth 2 (two) times daily with a meal., Disp: 180 tablet, Rfl: 1 .  omeprazole (PRILOSEC) 40 MG capsule, Take 1 capsule (40 mg total) by mouth daily., Disp: 90 capsule, Rfl: 1 .  ondansetron (ZOFRAN ODT) 4 MG disintegrating tablet, Take 1 tablet (4 mg total) by mouth every 8 (eight) hours as needed., Disp: 20 tablet, Rfl: 0 .  ONE TOUCH ULTRA TEST test strip, USE ONE STRIP TO CHECK GLUCOSE ONCE DAILY, Disp: 100 each, Rfl: 1 .  ONETOUCH DELICA LANCETS 58I MISC, USE ONE LANCET TO CHECK GLUCOSE ONCE DAILY AND AS NEEDED, Disp: 100 each, Rfl: 0 .  oxyCODONE (ROXICODONE) 5 MG immediate release tablet, Take 1-2 tablets (5-10 mg total) by mouth every 4 (four) hours as needed for severe pain or breakthrough pain., Disp: 45 tablet, Rfl: 0 .  traMADol (ULTRAM) 50 MG tablet, Take 2 tablets (100 mg total) by mouth 3 (three) times daily. (Patient taking differently: Take 50 mg by mouth 3 (three)  times daily as  needed for moderate pain. ), Disp: 90 tablet, Rfl: 0 .  valACYclovir (VALTREX) 1000 MG tablet, valacyclovir 1 gram tablet, Disp: , Rfl:   Allergies Latex  Review of Systems Review of Systems - Oncology ROS negative other than joint stiffness   Physical Exam  Vitals Wt Readings from Last 3 Encounters:  04/20/18 222 lb 14.4 oz (101.1 kg)  04/02/18 222 lb (100.7 kg)  03/27/18 225 lb 11.2 oz (102.4 kg)   Temp Readings from Last 3 Encounters:  04/20/18 98.1 F (36.7 C) (Oral)  04/02/18 98 F (36.7 C) (Oral)  03/27/18 97.9 F (36.6 C)   BP Readings from Last 3 Encounters:  04/20/18 (!) 154/70  04/02/18 (!) 123/59  03/27/18 (!) 120/59   Pulse Readings from Last 3 Encounters:  04/20/18 72  04/02/18 73  03/27/18 74   Constitutional: Well-developed, well-nourished, and in no distress.   HENT: Head: Normocephalic and atraumatic.  Mouth/Throat: No oropharyngeal exudate. Mucosa moist. Eyes: Pupils are equal, round, and reactive to light. Conjunctivae are normal. No scleral icterus.  Neck: Normal range of motion. Neck supple. No JVD present.  Cardiovascular: Normal rate, regular rhythm and normal heart sounds.  Exam reveals no gallop and no friction rub.   No murmur heard. Pulmonary/Chest: Effort normal and breath sounds normal. No respiratory distress. No wheezes.No rales.  Abdominal: Soft. Bowel sounds are normal. No distension. There is no tenderness. There is no guarding.  Musculoskeletal: No edema.  Joint stiffness related to recent surgery.   Lymphadenopathy: No cervical, axillary or supraclavicular adenopathy.  Neurological: Alert and oriented to person, place, and time. No cranial nerve deficit.  Skin: Skin is warm and dry. No rash noted. No erythema. No pallor.  Psychiatric: Affect and judgment normal.   Labs No visits with results within 3 Day(s) from this visit.  Latest known visit with results is:  Appointment on 04/02/2018  Component Date Value Ref Range Status   . WBC 04/02/2018 4.5  4.0 - 10.5 K/uL Final  . RBC 04/02/2018 3.79* 3.87 - 5.11 MIL/uL Final  . Hemoglobin 04/02/2018 12.2  12.0 - 15.0 g/dL Final  . HCT 04/02/2018 36.5  36.0 - 46.0 % Final  . MCV 04/02/2018 96.3  78.0 - 100.0 fL Final  . MCH 04/02/2018 32.2  26.0 - 34.0 pg Final  . MCHC 04/02/2018 33.4  30.0 - 36.0 g/dL Final  . RDW 04/02/2018 13.0  11.5 - 15.5 % Final  . Platelets 04/02/2018 98* 150 - 400 K/uL Final   Comment: PLATELET COUNT CONFIRMED BY SMEAR SPECIMEN CHECKED FOR CLOTS   . Neutrophils Relative % 04/02/2018 57  % Final  . Neutro Abs 04/02/2018 2.5  1.7 - 7.7 K/uL Final  . Lymphocytes Relative 04/02/2018 29  % Final  . Lymphs Abs 04/02/2018 1.3  0.7 - 4.0 K/uL Final  . Monocytes Relative 04/02/2018 11  % Final  . Monocytes Absolute 04/02/2018 0.5  0.1 - 1.0 K/uL Final  . Eosinophils Relative 04/02/2018 3  % Final  . Eosinophils Absolute 04/02/2018 0.1  0.0 - 0.7 K/uL Final  . Basophils Relative 04/02/2018 0  % Final  . Basophils Absolute 04/02/2018 0.0  0.0 - 0.1 K/uL Final   Performed at [facility], [address] [place], [place] [address]  . Sodium 04/02/2018 143  135 - 145 mmol/L Final  . Potassium 04/02/2018 3.7  3.5 - 5.1 mmol/L Final  . Chloride 04/02/2018 103  98 - 111 mmol/L Final  . CO2  04/02/2018 28  22 - 32 mmol/L Final  . Glucose, Bld 04/02/2018 158* 70 - 99 mg/dL Final  . BUN 04/02/2018 17  8 - 23 mg/dL Final  . Creatinine, Ser 04/02/2018 0.82  0.44 - 1.00 mg/dL Final  . Calcium 04/02/2018 9.0  8.9 - 10.3 mg/dL Final  . Total Protein 04/02/2018 5.9* 6.5 - 8.1 g/dL Final  . Albumin 04/02/2018 3.3* 3.5 - 5.0 g/dL Final  . AST 04/02/2018 35  15 - 41 U/L Final  . ALT 04/02/2018 24  0 - 44 U/L Final  . Alkaline Phosphatase 04/02/2018 86  38 - 126 U/L Final  . Total Bilirubin 04/02/2018 1.9* 0.3 - 1.2 mg/dL Final  . GFR calc non Af Amer 04/02/2018 >60  >60 mL/min Final  . GFR calc Af Amer 04/02/2018 >60  >60 mL/min Final   Comment: (NOTE) The  eGFR has been calculated using the CKD EPI equation. This calculation has not been validated in all clinical situations. eGFR's persistently <60 mL/min signify possible Chronic Kidney Disease.   Georgiann Hahn gap 04/02/2018 12  5 - 15 Final   Performed at [facility], [address], [place], [place] [address]  . LDH 04/02/2018 184  98 - 192 U/L Final   Performed at [facility], [address] [place], [place] [address]  . Total Protein ELP 04/02/2018 5.5* 6.0 - 8.5 g/dL Final  . Albumin ELP 04/02/2018 3.1  2.9 - 4.4 g/dL Final  . Alpha-1-Globulin 04/02/2018 0.3  0.0 - 0.4 g/dL Final  . Alpha-2-Globulin 04/02/2018 0.6  0.4 - 1.0 g/dL Final  . Beta Globulin 04/02/2018 0.8  0.7 - 1.3 g/dL Final  . Gamma Globulin 04/02/2018 0.8  0.4 - 1.8 g/dL Final  . M-Spike, % 04/02/2018 Not Observed  Not Observed g/dL Final  . SPE Interp. 04/02/2018 Comment   Final   Comment: (NOTE) The SPE pattern appears essentially unremarkable. Evidence of monoclonal protein is not apparent. Performed At: [facility] [place], [place] [id number] Rush Farmer MD JI:[id number]   . Comment 04/02/2018 Comment   Final   Comment: (NOTE) Protein electrophoresis scan will follow via computer, mail, or courier delivery.   Marland Kitchen GLOBULIN, TOTAL 04/02/2018 2.4  2.2 - 3.9 g/dL Corrected  . A/G Ratio 04/02/2018 1.3  0.7 - 1.7 Corrected  . ANA Ab, IFA 04/02/2018 Negative   Final   Comment: (NOTE)                                     Negative   <1:80                                     Borderline  1:80                                     Positive   >1:80 Performed At: [facility] [place], [place] [id number] Rush Farmer MD [id number]   . Rhuematoid fact SerPl-aCnc 04/02/2018 <10.0  0.0 - 13.9 IU/mL Final   Comment: (NOTE) Performed At: [facility] [place], [place] [id number] Rush Farmer MD PY:[id number]   . Sed Rate 04/02/2018 37* 0 - 22 mm/hr  Final   Performed at [facility], [address], [place], [place] [address]  . Path Review 04/02/2018 Reviewed By Violet Baldy, M.D.   Final   Comment: 8.21.19  THROMBOCYTOPENIA Performed at [facility], [place] [address] [place], [place] [address]      Pathology Orders Placed This Encounter  Procedures  . CBC with Differential/Platelet    Standing Status:   Future    Standing Expiration Date:   04/21/2019  . Comprehensive metabolic panel    Standing Status:   Future    Standing Expiration Date:   04/21/2019  . Lactate dehydrogenase    Standing Status:   Future    Standing Expiration Date:   04/21/2019       Zoila Shutter MD

## 2018-04-24 ENCOUNTER — Ambulatory Visit: Payer: PPO | Admitting: Physical Therapy

## 2018-04-24 ENCOUNTER — Encounter: Payer: Self-pay | Admitting: Physical Therapy

## 2018-04-24 DIAGNOSIS — M25661 Stiffness of right knee, not elsewhere classified: Secondary | ICD-10-CM

## 2018-04-24 DIAGNOSIS — M25561 Pain in right knee: Secondary | ICD-10-CM | POA: Diagnosis not present

## 2018-04-24 DIAGNOSIS — M6281 Muscle weakness (generalized): Secondary | ICD-10-CM

## 2018-04-24 NOTE — Therapy (Signed)
[facility] [place], [place], [address] Phone: [phone]   Fax:  [phone]  Physical Therapy Treatment  Patient Details  Name: Stacey Summers MRN: [id number] Date of Birth: [birth year] Referring Provider: Victorino December MD.   Encounter Date: 04/24/2018  PT End of Session - 04/24/18 1308    Visit Number  4    Number of Visits  12    Date for PT Re-Evaluation  05/09/18    Authorization Type  FOTO AT LEAST EVERY 5TH VISIT, 10TH VISIT PROGRESS NOTE AND KX MODIFIER AFTER THE 15 VISIT.    PT Start Time  1306    PT Stop Time  1351    PT Time Calculation (min)  45 min    Activity Tolerance  Patient tolerated treatment well    Behavior During Therapy  WFL for tasks assessed/performed       Past Medical History:  Diagnosis Date  . Allergy    seasonal  . Anxiety   . Arthritis    hands, back   . Asthma    bronchitis  . Cataract    removed both eyes  . Chronic back pain    lower back   . COPD (chronic obstructive pulmonary disease) ([place])   . Diabetes mellitus without complication ([place])   . DJD (degenerative joint disease)   . Fibromyalgia   . Genetic testing 03/08/2017   Ms. Goh underwent genetic counseling and testing for hereditary cancer syndromes on 02/21/2017. Her results were negative for mutations in all 83 genes analyzed by [facility]'s 83-gene Multi-Cancers Panel. Genes analyzed include: ALK, APC, ATM, AXIN2, BAP1, BARD1, BLM, BMPR1A, BRCA1, BRCA2, BRIP1, CASR, CDC73, CDH1, CDK4, CDKN1B, CDKN1C, CDKN2A, CEBPA, CHEK2, CTNNA1, DICER1, DIS3L2, EGFR, EPCAM  . GERD (gastroesophageal reflux disease)   . History of abnormal mammogram   . Hyperlipidemia   . Hypertension   . Irregular heart beat   . Neuromuscular disorder (HCC)    fibromyalgia  . Sleep apnea    cpap some  . Tubular adenoma of colon 05/09/12   "innumerable number" of flat polyps    Past Surgical History:  Procedure Laterality Date  . CARPAL  TUNNEL RELEASE Left 10/20/14   [facility]  . [place]  . CHOLECYSTECTOMY  1990  . COLONOSCOPY    . KNEE ARTHROSCOPY WITH SUBCHONDROPLASTY Right 03/27/2018   Procedure: Right knee arthroscopic partial medial meniscus repair versus menisectomy with medial tibia subchondroplasty;  Surgeon: Nicholes Stairs, MD;  Location: [facility];  Service: Orthopedics;  Laterality: Right;  120 mins  . LARYNX SURGERY     POLYP REMOVAL  . POLYPECTOMY     vocal cord  . TONSILLECTOMY  1965  . TUMOR REMOVAL     benign fatty tumors on abdomen    There were no vitals filed for this visit.  Subjective Assessment - 04/24/18 1308    Subjective  Reports that sometimes her knee pain is 6-7/10 but today after standing and bathing she is about 5/10.    Pertinent History  DJD; Osteopenia; chronic back pain; COPD; Fibromyalgia and irregular heart beat.    Limitations  Walking    How long can you walk comfortably?  Short distances in community.    Patient Stated Goals  Get back to normal.  "I have a lot to do."    Currently in Pain?  Yes    Pain Score  5     Pain Location  Knee    Pain  Orientation  Right    Pain Descriptors / Indicators  Discomfort    Pain Type  Surgical pain    Pain Onset  1 to 4 weeks ago         [facility] PT Assessment - 04/24/18 0001      Assessment   Medical Diagnosis  Right knee scope/medial sub chondroplasty.    Onset Date/Surgical Date  03/27/18    Next MD Visit  05/11/2018      Restrictions   Weight Bearing Restrictions  No                   [facility] Adult PT Treatment/Exercise - 04/24/18 0001      Exercises   Exercises  Knee/Hip      Knee/Hip Exercises: Aerobic   Nustep  L4 x12 min      Knee/Hip Exercises: Standing   Heel Raises  Both;20 reps    Hip Flexion  AROM;Both;20 reps;Knee bent    Hip Abduction  AROM;Both;15 reps;Knee straight      Knee/Hip Exercises: Supine   Short Arc Quad Sets  AROM;Right;2 sets;10 reps    Heel Slides  AROM;Right;20 reps     Other Supine Knee/Hip Exercises  B hip adductor squeeze x20 reps      Modalities   Modalities  Electrical Stimulation;Vasopneumatic      Electrical Stimulation   Electrical Stimulation Location  R knee    Electrical Stimulation Action  IFC    Electrical Stimulation Parameters  80-150 hz x15 min    Electrical Stimulation Goals  Edema;Pain      Vasopneumatic   Number Minutes Vasopneumatic   15 minutes    Vasopnuematic Location   Knee    Vasopneumatic Pressure  Low    Vasopneumatic Temperature   34               PT Short Term Goals - 04/11/18 1400      PT SHORT TERM GOAL #1   Title  Independent with an initial HEP.      PT SHORT TERM GOAL #2   Title  Full active right knee extension.    Time  2    Period  Weeks    Status  New        PT Long Term Goals - 04/11/18 1409      PT LONG TERM GOAL #1   Title  Independent with an advanced HEP.    Time  4    Period  Weeks    Status  New      PT LONG TERM GOAL #2   Title  Active right knee flexion to 115 degrees+ so the patient can perform functional tasks and do so with pain not > 2-3/10.    Time  4    Period  Weeks    Status  New      PT LONG TERM GOAL #3   Title  Increase right knee strength to a solid 4+/5 to provide good stability for accomplishment of functional activities.    Time  4    Period  Weeks    Status  New      PT LONG TERM GOAL #4   Title  Perform a reciprocating stair gait with one railing with pain not > 2-3/10.    Time  4    Period  Weeks    Status  New      PT LONG TERM GOAL #5   Title  Perform ADL's with  pain not > 2/10.    Time  4    Period  Weeks    Status  New            Plan - 04/24/18 1344    Clinical Impression Statement  Patient presented in clinic today with mid level R knee pain with moderately increased edema. Patient able to complete therex fairly well although limited with standing exercises due to pain. Patient reports limitation with standing activities at home  such as sweeping and dishes due to pain. Patient experienced muscle fatigue during the therex session per patient report. Normal modalities response noted following removal of the modalities. Patient denied any pain following end of treatment and stated that she felt " fine" following end of treatment.    Rehab Potential  Excellent    PT Frequency  3x / week    PT Duration  4 weeks    PT Treatment/Interventions  ADLs/Self Care Home Management;Cryotherapy;Electrical Stimulation;Ultrasound;Moist Heat;Gait training;Stair training;Functional mobility training;Therapeutic activities;Therapeutic exercise;Neuromuscular re-education;Patient/family education;Passive range of motion;Manual techniques;Vasopneumatic Device    PT Next Visit Plan  Nustep; Pain-free right quad strengthening; vaso and e'stim.    Consulted and Agree with Plan of Care  Patient       Patient will benefit from skilled therapeutic intervention in order to improve the following deficits and impairments:  Abnormal gait, Pain, Decreased activity tolerance, Decreased range of motion, Decreased strength  Visit Diagnosis: Acute pain of right knee  Stiffness of right knee, not elsewhere classified  Muscle weakness (generalized)     Problem List Patient Active Problem List   Diagnosis Date Noted  . Complex tear of medial meniscus of right knee 03/27/2018  . Stress fracture of right tibia 03/27/2018  . Chronic pain of right knee 12/19/2017  . Genetic testing 03/08/2017  . Restrictive lung disease secondary to obesity 08/04/2015  . Physical deconditioning 08/04/2015  . OSA treated with BiPAP 08/04/2015  . Gastroesophageal reflux disease without esophagitis 12/25/2014  . Low serum vitamin D 05/28/2014  . Osteopenia 05/28/2014  . COPD (chronic obstructive pulmonary disease) ([place]) 01/29/2014  . Dyspnea on exertion 01/07/2014  . Fatigue 02-28-2013  . Murmur, cardiac Feb 28, 2013  . Diabetes ([place]) 02-28-13  . Morbid obesity  ([place]) 2013-02-28  . Family history of sudden death 28-Feb-2013  . Hyperlipidemia 02-28-13  . Hypertension 12/20/2012  . GAD (generalized anxiety disorder) 12/20/2012  . Fibromyalgia muscle pain 12/20/2012    Standley Brooking, PTA 04/24/2018, 1:54 PM  [facility] [address] [place], [place], [address] Phone: [phone]   Fax:  [phone]  Name: KAMBRA BEACHEM MRN: [id number] Date of Birth: 06/17/1946

## 2018-04-26 ENCOUNTER — Encounter: Payer: Self-pay | Admitting: Physical Therapy

## 2018-04-26 ENCOUNTER — Ambulatory Visit: Payer: PPO | Admitting: Physical Therapy

## 2018-04-26 DIAGNOSIS — M25561 Pain in right knee: Secondary | ICD-10-CM | POA: Diagnosis not present

## 2018-04-26 DIAGNOSIS — M6281 Muscle weakness (generalized): Secondary | ICD-10-CM

## 2018-04-26 DIAGNOSIS — M25661 Stiffness of right knee, not elsewhere classified: Secondary | ICD-10-CM

## 2018-04-26 NOTE — Therapy (Signed)
[facility] [place], [place], [address] Phone: [phone]   Fax:  [phone]  Physical Therapy Treatment  Patient Details  Name: Stacey Summers MRN: [id number] Date of Birth: 04-28-1946 Referring Provider: Victorino December MD.   Encounter Date: 04/26/2018  PT End of Session - 04/26/18 1313    Visit Number  5    Number of Visits  12    Date for PT Re-Evaluation  05/09/18    Authorization Type  FOTO AT LEAST EVERY 5TH VISIT, 10TH VISIT PROGRESS NOTE AND KX MODIFIER AFTER THE 15 VISIT.    PT Start Time  1304    PT Stop Time  1400    PT Time Calculation (min)  56 min    Activity Tolerance  Patient tolerated treatment well    Behavior During Therapy  WFL for tasks assessed/performed       Past Medical History:  Diagnosis Date  . Allergy    seasonal  . Anxiety   . Arthritis    hands, back   . Asthma    bronchitis  . Cataract    removed both eyes  . Chronic back pain    lower back   . COPD (chronic obstructive pulmonary disease) ([place])   . Diabetes mellitus without complication ([place])   . DJD (degenerative joint disease)   . Fibromyalgia   . Genetic testing 03/08/2017   Ms. Andrzejewski underwent genetic counseling and testing for hereditary cancer syndromes on 02/21/2017. Her results were negative for mutations in all 83 genes analyzed by [facility]'s 83-gene Multi-Cancers Panel. Genes analyzed include: ALK, APC, ATM, AXIN2, BAP1, BARD1, BLM, BMPR1A, BRCA1, BRCA2, BRIP1, CASR, CDC73, CDH1, CDK4, CDKN1B, CDKN1C, CDKN2A, CEBPA, CHEK2, CTNNA1, DICER1, DIS3L2, EGFR, EPCAM  . GERD (gastroesophageal reflux disease)   . History of abnormal mammogram   . Hyperlipidemia   . Hypertension   . Irregular heart beat   . Neuromuscular disorder (HCC)    fibromyalgia  . Sleep apnea    cpap some  . Tubular adenoma of colon 05/09/12   "innumerable number" of flat polyps    Past Surgical History:  Procedure Laterality Date  . CARPAL  TUNNEL RELEASE Left 10/20/14   [facility]  . [place]  . CHOLECYSTECTOMY  1990  . COLONOSCOPY    . KNEE ARTHROSCOPY WITH SUBCHONDROPLASTY Right 03/27/2018   Procedure: Right knee arthroscopic partial medial meniscus repair versus menisectomy with medial tibia subchondroplasty;  Surgeon: Nicholes Stairs, MD;  Location: [facility];  Service: Orthopedics;  Laterality: Right;  120 mins  . LARYNX SURGERY     POLYP REMOVAL  . POLYPECTOMY     vocal cord  . TONSILLECTOMY  1965  . TUMOR REMOVAL     benign fatty tumors on abdomen    There were no vitals filed for this visit.  Subjective Assessment - 04/26/18 1304    Subjective  Patient arrived feeling better after last treatment and then did too much at home which increased her discomfort today    Pertinent History  DJD; Osteopenia; chronic back pain; COPD; Fibromyalgia and irregular heart beat.    Limitations  Walking    How long can you walk comfortably?  Short distances in community.    Patient Stated Goals  Get back to normal.  "I have a lot to do."    Currently in Pain?  Yes    Pain Score  5     Pain Location  Knee  Pain Orientation  Right    Pain Descriptors / Indicators  Discomfort    Pain Type  Surgical pain    Pain Onset  1 to 4 weeks ago    Pain Frequency  Constant    Aggravating Factors   increased activity    Pain Relieving Factors  at rest         [facility] PT Assessment - 04/26/18 0001      ROM / Strength   AROM / PROM / Strength  AROM      AROM   AROM Assessment Site  Knee    Right/Left Knee  Right    Right Knee Extension  -5    Right Knee Flexion  104                   [facility] Adult PT Treatment/Exercise - 04/26/18 0001      Knee/Hip Exercises: Aerobic   Nustep  L3 x69mn UE/LE activiyt      Knee/Hip Exercises: Seated   Long Arc Quad  Right;2 sets;10 reps      Knee/Hip Exercises: Supine   Short Arc Quad Sets  AROM;Right;2 sets;10 reps    Heel Slides  AROM;Right;20 reps    Other  Supine Knee/Hip Exercises  B hip adductor squeeze x20 reps      Electrical Stimulation   Electrical Stimulation Location  R knee    Electrical Stimulation Action  IFC    Electrical Stimulation Parameters  1-10hz  x165m    Electrical Stimulation Goals  Edema;Pain      Vasopneumatic   Number Minutes Vasopneumatic   15 minutes    Vasopnuematic Location   Knee    Vasopneumatic Pressure  Low      Manual Therapy   Manual Therapy  Soft tissue mobilization;Passive ROM    Manual therapy comments  gentle manual STW to knee around lateral /post knee to reduce pain    Passive ROM  gentle ROM to increase range within tolerance             PT Education - 04/26/18 1329    Education Details  HEP    Person(s) Educated  Patient    Methods  Explanation;Demonstration;Handout    Comprehension  Verbalized understanding       PT Short Term Goals - 04/26/18 1313      PT SHORT TERM GOAL #1   Title  Independent with an initial HEP.    Time  2    Period  Weeks    Status  Achieved   04/26/18     PT SHORT TERM GOAL #2   Title  Full active right knee extension.    Time  2    Period  Weeks    Status  On-going        PT Long Term Goals - 04/26/18 1314      PT LONG TERM GOAL #1   Title  Independent with an advanced HEP.    Time  4    Period  Weeks    Status  On-going      PT LONG TERM GOAL #2   Title  Active right knee flexion to 115 degrees+ so the patient can perform functional tasks and do so with pain not > 2-3/10.    Time  4    Period  Weeks    Status  On-going      PT LONG TERM GOAL #3   Title  Increase right knee strength to a solid  4+/5 to provide good stability for accomplishment of functional activities.    Period  Weeks    Status  On-going      PT LONG TERM GOAL #4   Title  Perform a reciprocating stair gait with one railing with pain not > 2-3/10.    Time  4    Period  Weeks    Status  On-going      PT LONG TERM GOAL #5   Title  Perform ADL's with pain not >  2/10.    Time  4    Period  Weeks    Status  On-going            Plan - 04/26/18 1349    Clinical Impression Statement  Patient tolerated treatment well today. Patient arrived with some increased discomfort from doing too much at home. Today focused on gentle strengthening with very gentle STW and PROM to right knee. HEP provided for patient today. Patient felt better after treatment today. STG #1 met today, and others ongoing at this time due to limitations.     Rehab Potential  Excellent    PT Frequency  3x / week    PT Duration  4 weeks    PT Treatment/Interventions  ADLs/Self Care Home Management;Cryotherapy;Electrical Stimulation;Ultrasound;Moist Heat;Gait training;Stair training;Functional mobility training;Therapeutic activities;Therapeutic exercise;Neuromuscular re-education;Patient/family education;Passive range of motion;Manual techniques;Vasopneumatic Device    PT Next Visit Plan  cont with POC for Pain-free right quad strengthening; vaso and e'stim. PRN    Consulted and Agree with Plan of Care  Patient       Patient will benefit from skilled therapeutic intervention in order to improve the following deficits and impairments:  Abnormal gait, Pain, Decreased activity tolerance, Decreased range of motion, Decreased strength  Visit Diagnosis: Acute pain of right knee  Stiffness of right knee, not elsewhere classified  Muscle weakness (generalized)     Problem List Patient Active Problem List   Diagnosis Date Noted  . Complex tear of medial meniscus of right knee 03/27/2018  . Stress fracture of right tibia 03/27/2018  . Chronic pain of right knee 12/19/2017  . Genetic testing 03/08/2017  . Restrictive lung disease secondary to obesity 08/04/2015  . Physical deconditioning 08/04/2015  . OSA treated with BiPAP 08/04/2015  . Gastroesophageal reflux disease without esophagitis 12/25/2014  . Low serum vitamin D 05/28/2014  . Osteopenia 05/28/2014  . COPD (chronic  obstructive pulmonary disease) ([place]) 01/29/2014  . Dyspnea on exertion 01/07/2014  . Fatigue 20-Mar-2013  . Murmur, cardiac 03-20-2013  . Diabetes ([place]) 03/20/2013  . Morbid obesity ([place]) 03-20-13  . Family history of sudden death 03-20-2013  . Hyperlipidemia Mar 20, 2013  . Hypertension 12/20/2012  . GAD (generalized anxiety disorder) 12/20/2012  . Fibromyalgia muscle pain 12/20/2012    DUNFORD, CHRISTINA P, PTA 04/26/2018, 2:02 PM  [facility] [place], [place], [address] Phone: [phone]   Fax:  [phone]  Name: Stacey Summers MRN: [id number] Date of Birth: 1946-06-03

## 2018-04-26 NOTE — Patient Instructions (Addendum)
  Strengthening: Terminal Knee Extension (Supine)    With right knee over bolster, straighten knee by tightening muscles on top of thigh. Keep bottom of knee on bolster. Repeat __10__ times per set. Do __2__ sets per session. Do ___1_ sessions per day.  http://orth.exer.us/626   Copyright  VHI. All rights reserved.  Quad Set: Slight Flexion    Tense muscles on top of left thigh. Hold _5___ seconds. Repeat __10__ times per set. Do _2___ sets per session. Do __2__ sessions per day.   Sitting knee extension stretch     Place one foot on table. Straighten leg and attempt to keep it straight, then push down until feel a stretch. Hold _30__ seconds. Repeat __5-10_ times each leg, alternating. Do _2-4__ sessions per day.     Chair Knee Flexion   Keeping feet on floor, slide foot of operated leg back, bending knee. Hold ___30_ seconds. Repeat __5__ times. Do __2-4__ sessions a day.  Heel Slide   Bend left knee and pull heel toward buttocks. Use strap around foot and pull strap with arms to assist knee to bend further. Hold 10 secs.  Repeat ____ times. Do ____ sessions per day.

## 2018-05-01 ENCOUNTER — Encounter: Payer: Self-pay | Admitting: Physical Therapy

## 2018-05-01 ENCOUNTER — Ambulatory Visit: Payer: PPO | Admitting: Physical Therapy

## 2018-05-01 DIAGNOSIS — M25661 Stiffness of right knee, not elsewhere classified: Secondary | ICD-10-CM

## 2018-05-01 DIAGNOSIS — M6281 Muscle weakness (generalized): Secondary | ICD-10-CM

## 2018-05-01 DIAGNOSIS — M25561 Pain in right knee: Secondary | ICD-10-CM

## 2018-05-01 NOTE — Therapy (Signed)
[facility] [place], [place], [address] Phone: [phone]   Fax:  [phone]  Physical Therapy Treatment  Patient Details  Name: Stacey Summers MRN: [id number] Date of Birth: 08-20-1945 Referring Provider: Victorino December MD.   Encounter Date: 05/01/2018  PT End of Session - 05/01/18 1308    Visit Number  6    Number of Visits  12    Date for PT Re-Evaluation  05/09/18    Authorization Type  FOTO AT LEAST EVERY 5TH VISIT, 10TH VISIT PROGRESS NOTE AND KX MODIFIER AFTER THE 15 VISIT.    PT Start Time  1306    PT Stop Time  1354    PT Time Calculation (min)  48 min    Activity Tolerance  Patient tolerated treatment well    Behavior During Therapy  WFL for tasks assessed/performed       Past Medical History:  Diagnosis Date  . Allergy    seasonal  . Anxiety   . Arthritis    hands, back   . Asthma    bronchitis  . Cataract    removed both eyes  . Chronic back pain    lower back   . COPD (chronic obstructive pulmonary disease) ([place])   . Diabetes mellitus without complication ([place])   . DJD (degenerative joint disease)   . Fibromyalgia   . Genetic testing 03/08/2017   Ms. Ly underwent genetic counseling and testing for hereditary cancer syndromes on 02/21/2017. Her results were negative for mutations in all 83 genes analyzed by [facility]'s 83-gene Multi-Cancers Panel. Genes analyzed include: ALK, APC, ATM, AXIN2, BAP1, BARD1, BLM, BMPR1A, BRCA1, BRCA2, BRIP1, CASR, CDC73, CDH1, CDK4, CDKN1B, CDKN1C, CDKN2A, CEBPA, CHEK2, CTNNA1, DICER1, DIS3L2, EGFR, EPCAM  . GERD (gastroesophageal reflux disease)   . History of abnormal mammogram   . Hyperlipidemia   . Hypertension   . Irregular heart beat   . Neuromuscular disorder (HCC)    fibromyalgia  . Sleep apnea    cpap some  . Tubular adenoma of colon 05/09/12   "innumerable number" of flat polyps    Past Surgical History:  Procedure Laterality Date  . CARPAL  TUNNEL RELEASE Left 10/20/14   [facility]  . [place]  . CHOLECYSTECTOMY  1990  . COLONOSCOPY    . KNEE ARTHROSCOPY WITH SUBCHONDROPLASTY Right 03/27/2018   Procedure: Right knee arthroscopic partial medial meniscus repair versus menisectomy with medial tibia subchondroplasty;  Surgeon: Nicholes Stairs, MD;  Location: [facility];  Service: Orthopedics;  Laterality: Right;  120 mins  . LARYNX SURGERY     POLYP REMOVAL  . POLYPECTOMY     vocal cord  . TONSILLECTOMY  1965  . TUMOR REMOVAL     benign fatty tumors on abdomen    There were no vitals filed for this visit.  Subjective Assessment - 05/01/18 1307    Subjective  Reports that she may have overdone it yesterday and now her knee is stiff and hurts.    Pertinent History  DJD; Osteopenia; chronic back pain; COPD; Fibromyalgia and irregular heart beat.    Limitations  Walking    How long can you walk comfortably?  Short distances in community.    Patient Stated Goals  Get back to normal.  "I have a lot to do."    Currently in Pain?  Yes    Pain Score  6     Pain Location  Knee    Pain Orientation  Right    Pain Descriptors / Indicators  Discomfort    Pain Type  Surgical pain    Pain Onset  1 to 4 weeks ago         [facility] PT Assessment - 05/01/18 0001      Assessment   Medical Diagnosis  Right knee scope/medial sub chondroplasty.    Onset Date/Surgical Date  03/27/18    Next MD Visit  05/11/2018      Restrictions   Weight Bearing Restrictions  No      ROM / Strength   AROM / PROM / Strength  AROM      AROM   Overall AROM   Deficits    AROM Assessment Site  Knee    Right/Left Knee  Right    Right Knee Extension  -4    Right Knee Flexion  105                   [facility] Adult PT Treatment/Exercise - 05/01/18 0001      Exercises   Exercises  Knee/Hip      Knee/Hip Exercises: Stretches   Passive Hamstring Stretch  Right;3 reps;30 seconds      Knee/Hip Exercises: Aerobic   Nustep  L5 x12 min       Knee/Hip Exercises: Standing   Forward Lunges  Right;10 reps    Hip Abduction  AROM;Right;15 reps;Knee straight      Knee/Hip Exercises: Supine   Short Arc Quad Sets  AROM;Right;2 sets;10 reps    Heel Slides  AROM;Right;20 reps    Hip Adduction Isometric  Strengthening;Both;2 sets;10 reps    Other Supine Knee/Hip Exercises  B hip clam red theraband x20 reps      Modalities   Modalities  Electrical Stimulation;Vasopneumatic      Electrical Stimulation   Electrical Stimulation Location  R knee    Electrical Stimulation Action  IFC    Electrical Stimulation Parameters  80-150 hz x15 min    Electrical Stimulation Goals  Edema;Pain      Vasopneumatic   Number Minutes Vasopneumatic   15 minutes    Vasopnuematic Location   Knee    Vasopneumatic Pressure  Low    Vasopneumatic Temperature   34               PT Short Term Goals - 04/26/18 1313      PT SHORT TERM GOAL #1   Title  Independent with an initial HEP.    Time  2    Period  Weeks    Status  Achieved   04/26/18     PT SHORT TERM GOAL #2   Title  Full active right knee extension.    Time  2    Period  Weeks    Status  On-going        PT Long Term Goals - 04/26/18 1314      PT LONG TERM GOAL #1   Title  Independent with an advanced HEP.    Time  4    Period  Weeks    Status  On-going      PT LONG TERM GOAL #2   Title  Active right knee flexion to 115 degrees+ so the patient can perform functional tasks and do so with pain not > 2-3/10.    Time  4    Period  Weeks    Status  On-going      PT LONG TERM GOAL #3   Title  Increase right knee strength to a solid 4+/5 to provide good stability for accomplishment of functional activities.    Period  Weeks    Status  On-going      PT LONG TERM GOAL #4   Title  Perform a reciprocating stair gait with one railing with pain not > 2-3/10.    Time  4    Period  Weeks    Status  On-going      PT LONG TERM GOAL #5   Title  Perform ADL's with pain not >  2/10.    Time  4    Period  Weeks    Status  On-going            Plan - 05/01/18 1342    Clinical Impression Statement  Patient tolerated today's treatment fairly well as she reported overdoing activity yesterday. Patient very cautious with therex today due to pain. Patient guided through standing and supine activities with intermittant reports of discomfort. Increased tightness of R HS noted with passive stretching. Normal modalities response noted following removal of the modalities.     Rehab Potential  Excellent    PT Frequency  3x / week    PT Duration  4 weeks    PT Treatment/Interventions  ADLs/Self Care Home Management;Cryotherapy;Electrical Stimulation;Ultrasound;Moist Heat;Gait training;Stair training;Functional mobility training;Therapeutic activities;Therapeutic exercise;Neuromuscular re-education;Patient/family education;Passive range of motion;Manual techniques;Vasopneumatic Device    PT Next Visit Plan  cont with POC for Pain-free right quad strengthening; vaso and e'stim. PRN    Consulted and Agree with Plan of Care  Patient       Patient will benefit from skilled therapeutic intervention in order to improve the following deficits and impairments:  Abnormal gait, Pain, Decreased activity tolerance, Decreased range of motion, Decreased strength  Visit Diagnosis: Acute pain of right knee  Stiffness of right knee, not elsewhere classified  Muscle weakness (generalized)     Problem List Patient Active Problem List   Diagnosis Date Noted  . Complex tear of medial meniscus of right knee 03/27/2018  . Stress fracture of right tibia 03/27/2018  . Chronic pain of right knee 12/19/2017  . Genetic testing 03/08/2017  . Restrictive lung disease secondary to obesity 08/04/2015  . Physical deconditioning 08/04/2015  . OSA treated with BiPAP 08/04/2015  . Gastroesophageal reflux disease without esophagitis 12/25/2014  . Low serum vitamin D 05/28/2014  . Osteopenia  05/28/2014  . COPD (chronic obstructive pulmonary disease) ([place]) 01/29/2014  . Dyspnea on exertion 01/07/2014  . Fatigue 2013/02/28  . Murmur, cardiac 02-28-2013  . Diabetes ([place]) 02-28-13  . Morbid obesity ([place]) 2013-02-28  . Family history of sudden death 02/28/13  . Hyperlipidemia February 28, 2013  . Hypertension 12/20/2012  . GAD (generalized anxiety disorder) 12/20/2012  . Fibromyalgia muscle pain 12/20/2012    Standley Brooking, PTA 05/01/2018, 2:01 PM  [facility] [address] [place], [place], [address] Phone: [phone]   Fax:  [phone]  Name: Stacey Summers MRN: [id number] Date of Birth: 1946-04-04

## 2018-05-03 ENCOUNTER — Encounter: Payer: Self-pay | Admitting: Physical Therapy

## 2018-05-03 ENCOUNTER — Ambulatory Visit: Payer: PPO | Admitting: Physical Therapy

## 2018-05-03 DIAGNOSIS — M25561 Pain in right knee: Secondary | ICD-10-CM

## 2018-05-03 DIAGNOSIS — M6281 Muscle weakness (generalized): Secondary | ICD-10-CM

## 2018-05-03 DIAGNOSIS — M25661 Stiffness of right knee, not elsewhere classified: Secondary | ICD-10-CM

## 2018-05-03 NOTE — Therapy (Signed)
[facility] [place], [place], [address] Phone: [phone]   Fax:  [phone]  Physical Therapy Treatment  Patient Details  Name: Stacey Summers MRN: [id number] Date of Birth: [birth year] Referring Provider: Victorino December MD.   Encounter Date: 05/03/2018  PT End of Session - 05/03/18 1322    Visit Number  7    Number of Visits  12    Date for PT Re-Evaluation  05/09/18    Authorization Type  FOTO AT LEAST EVERY 5TH VISIT, 10TH VISIT PROGRESS NOTE AND KX MODIFIER AFTER THE 15 VISIT.    PT Start Time  1317   late arrival   PT Stop Time  1352    PT Time Calculation (min)  35 min    Activity Tolerance  Patient tolerated treatment well    Behavior During Therapy  WFL for tasks assessed/performed       Past Medical History:  Diagnosis Date  . Allergy    seasonal  . Anxiety   . Arthritis    hands, back   . Asthma    bronchitis  . Cataract    removed both eyes  . Chronic back pain    lower back   . COPD (chronic obstructive pulmonary disease) ([place])   . Diabetes mellitus without complication ([place])   . DJD (degenerative joint disease)   . Fibromyalgia   . Genetic testing 03/08/2017   [name] underwent genetic counseling and testing for hereditary cancer syndromes on 02/21/2017. Her results were negative for mutations in all 83 genes analyzed by [facility]'s 83-gene Multi-Cancers Panel. Genes analyzed include: ALK, APC, ATM, AXIN2, BAP1, BARD1, BLM, BMPR1A, BRCA1, BRCA2, BRIP1, CASR, CDC73, CDH1, CDK4, CDKN1B, CDKN1C, CDKN2A, CEBPA, CHEK2, CTNNA1, DICER1, DIS3L2, EGFR, EPCAM  . GERD (gastroesophageal reflux disease)   . History of abnormal mammogram   . Hyperlipidemia   . Hypertension   . Irregular heart beat   . Neuromuscular disorder (HCC)    fibromyalgia  . Sleep apnea    cpap some  . Tubular adenoma of colon 05/09/12   "innumerable number" of flat polyps    Past Surgical History:  Procedure Laterality  Date  . CARPAL TUNNEL RELEASE Left 10/20/14   [facility]  . [place]  . CHOLECYSTECTOMY  1990  . COLONOSCOPY    . KNEE ARTHROSCOPY WITH SUBCHONDROPLASTY Right 03/27/2018   Procedure: Right knee arthroscopic partial medial meniscus repair versus menisectomy with medial tibia subchondroplasty;  Surgeon: Nicholes Stairs, MD;  Location: [facility];  Service: Orthopedics;  Laterality: Right;  120 mins  . LARYNX SURGERY     POLYP REMOVAL  . POLYPECTOMY     vocal cord  . TONSILLECTOMY  1965  . TUMOR REMOVAL     benign fatty tumors on abdomen    There were no vitals filed for this visit.  Subjective Assessment - 05/03/18 1319    Subjective  Reports that she had some increased pain last night in R knee.    Pertinent History  DJD; Osteopenia; chronic back pain; COPD; Fibromyalgia and irregular heart beat.    Limitations  Walking    How long can you walk comfortably?  Short distances in community.    Patient Stated Goals  Get back to normal.  "I have a lot to do."    Currently in Pain?  Yes    Pain Score  4     Pain Location  Knee    Pain Orientation  Right  Pain Descriptors / Indicators  Discomfort    Pain Type  Surgical pain    Pain Onset  1 to 4 weeks ago         [facility] PT Assessment - 05/03/18 0001      Assessment   Medical Diagnosis  Right knee scope/medial sub chondroplasty.    Onset Date/Surgical Date  03/27/18    Next MD Visit  05/11/2018      Restrictions   Weight Bearing Restrictions  No                   [facility] Adult PT Treatment/Exercise - 05/03/18 0001      Exercises   Exercises  Knee/Hip      Knee/Hip Exercises: Aerobic   Nustep  L5 x11 min      Knee/Hip Exercises: Supine   Short Arc Quad Sets  AROM;Right;2 sets;10 reps    Heel Slides  AROM;Right;20 reps    Hip Adduction Isometric  Strengthening;Both;2 sets;10 reps      Modalities   Modalities  Teacher, English as a foreign language Location  R knee    Electrical Stimulation Action  IFC    Electrical Stimulation Parameters  80-150 hz x15 min    Electrical Stimulation Goals  Edema;Pain      Vasopneumatic   Number Minutes Vasopneumatic   15 minutes    Vasopnuematic Location   Knee    Vasopneumatic Pressure  Low    Vasopneumatic Temperature   34               PT Short Term Goals - 04/26/18 1313      PT SHORT TERM GOAL #1   Title  Independent with an initial HEP.    Time  2    Period  Weeks    Status  Achieved   04/26/18     PT SHORT TERM GOAL #2   Title  Full active right knee extension.    Time  2    Period  Weeks    Status  On-going        PT Long Term Goals - 04/26/18 1314      PT LONG TERM GOAL #1   Title  Independent with an advanced HEP.    Time  4    Period  Weeks    Status  On-going      PT LONG TERM GOAL #2   Title  Active right knee flexion to 115 degrees+ so the patient can perform functional tasks and do so with pain not > 2-3/10.    Time  4    Period  Weeks    Status  On-going      PT LONG TERM GOAL #3   Title  Increase right knee strength to a solid 4+/5 to provide good stability for accomplishment of functional activities.    Period  Weeks    Status  On-going      PT LONG TERM GOAL #4   Title  Perform a reciprocating stair gait with one railing with pain not > 2-3/10.    Time  4    Period  Weeks    Status  On-going      PT LONG TERM GOAL #5   Title  Perform ADL's with pain not > 2/10.    Time  4    Period  Weeks    Status  On-going  Plan - 05/03/18 1344    Clinical Impression Statement  Patient limited in treatment today due to pain and late arrival. Patient reported shooting pain in R knee last night. Patient able to do low level R knee ROM and strengthening exercises but able to complete with good, steady pace. Patient worried that pain is worsening instead of improving. Normal modalities response noted following removal of the  modalities.    Rehab Potential  Excellent    PT Frequency  3x / week    PT Duration  4 weeks    PT Treatment/Interventions  ADLs/Self Care Home Management;Cryotherapy;Electrical Stimulation;Ultrasound;Moist Heat;Gait training;Stair training;Functional mobility training;Therapeutic activities;Therapeutic exercise;Neuromuscular re-education;Patient/family education;Passive range of motion;Manual techniques;Vasopneumatic Device    PT Next Visit Plan  cont with POC for Pain-free right quad strengthening; vaso and e'stim. PRN    Consulted and Agree with Plan of Care  Patient       Patient will benefit from skilled therapeutic intervention in order to improve the following deficits and impairments:  Abnormal gait, Pain, Decreased activity tolerance, Decreased range of motion, Decreased strength  Visit Diagnosis: Acute pain of right knee  Stiffness of right knee, not elsewhere classified  Muscle weakness (generalized)     Problem List Patient Active Problem List   Diagnosis Date Noted  . Complex tear of medial meniscus of right knee 03/27/2018  . Stress fracture of right tibia 03/27/2018  . Chronic pain of right knee 12/19/2017  . Genetic testing 03/08/2017  . Restrictive lung disease secondary to obesity 08/04/2015  . Physical deconditioning 08/04/2015  . OSA treated with BiPAP 08/04/2015  . Gastroesophageal reflux disease without esophagitis 12/25/2014  . Low serum vitamin D 05/28/2014  . Osteopenia 05/28/2014  . COPD (chronic obstructive pulmonary disease) ([place]) 01/29/2014  . Dyspnea on exertion 01/07/2014  . Fatigue 03-15-13  . Murmur, cardiac 03/15/13  . Diabetes ([place]) 03-15-2013  . Morbid obesity ([place]) 2013/03/15  . Family history of sudden death 15-Mar-2013  . Hyperlipidemia 15-Mar-2013  . Hypertension 12/20/2012  . GAD (generalized anxiety disorder) 12/20/2012  . Fibromyalgia muscle pain 12/20/2012    Standley Brooking, PTA 05/03/2018, 2:25 PM  [facility] [address] [place], [place], [address] Phone: [phone]   Fax:  [phone]  Name: Stacey Summers MRN: [id number] Date of Birth: Oct 07, 1945

## 2018-05-08 ENCOUNTER — Encounter: Payer: Self-pay | Admitting: Physical Therapy

## 2018-05-08 ENCOUNTER — Ambulatory Visit: Payer: PPO | Admitting: Physical Therapy

## 2018-05-08 DIAGNOSIS — M25561 Pain in right knee: Secondary | ICD-10-CM

## 2018-05-08 DIAGNOSIS — M6281 Muscle weakness (generalized): Secondary | ICD-10-CM

## 2018-05-08 DIAGNOSIS — M25661 Stiffness of right knee, not elsewhere classified: Secondary | ICD-10-CM

## 2018-05-08 NOTE — Therapy (Signed)
[facility] [place], [place], [address] Phone: [phone]   Fax:  [phone]  Physical Therapy Treatment  Patient Details  Name: Stacey Summers MRN: [id number] Date of Birth: Aug 03, 1946 Referring Provider: Victorino December MD.   Encounter Date: 05/08/2018  PT End of Session - 05/08/18 1137    Visit Number  8    Number of Visits  12    Date for PT Re-Evaluation  05/09/18    Authorization Type  FOTO AT LEAST EVERY 5TH VISIT, 10TH VISIT PROGRESS NOTE AND KX MODIFIER AFTER THE 15 VISIT.    PT Start Time  1113    PT Stop Time  1200    PT Time Calculation (min)  47 min    Activity Tolerance  Patient limited by pain    Behavior During Therapy  [facility] for tasks assessed/performed       Past Medical History:  Diagnosis Date  . Allergy    seasonal  . Anxiety   . Arthritis    hands, back   . Asthma    bronchitis  . Cataract    removed both eyes  . Chronic back pain    lower back   . COPD (chronic obstructive pulmonary disease) ([place])   . Diabetes mellitus without complication ([place])   . DJD (degenerative joint disease)   . Fibromyalgia   . Genetic testing 03/08/2017   Ms. Pullara underwent genetic counseling and testing for hereditary cancer syndromes on 02/21/2017. Her results were negative for mutations in all 83 genes analyzed by [facility]'s 83-gene Multi-Cancers Panel. Genes analyzed include: ALK, APC, ATM, AXIN2, BAP1, BARD1, BLM, BMPR1A, BRCA1, BRCA2, BRIP1, CASR, CDC73, CDH1, CDK4, CDKN1B, CDKN1C, CDKN2A, CEBPA, CHEK2, CTNNA1, DICER1, DIS3L2, EGFR, EPCAM  . GERD (gastroesophageal reflux disease)   . History of abnormal mammogram   . Hyperlipidemia   . Hypertension   . Irregular heart beat   . Neuromuscular disorder (HCC)    fibromyalgia  . Sleep apnea    cpap some  . Tubular adenoma of colon 05/09/12   "innumerable number" of flat polyps    Past Surgical History:  Procedure Laterality Date  . CARPAL TUNNEL  RELEASE Left 10/20/14   [facility]  . [place]  . CHOLECYSTECTOMY  1990  . COLONOSCOPY    . KNEE ARTHROSCOPY WITH SUBCHONDROPLASTY Right 03/27/2018   Procedure: Right knee arthroscopic partial medial meniscus repair versus menisectomy with medial tibia subchondroplasty;  Surgeon: Nicholes Stairs, MD;  Location: [facility];  Service: Orthopedics;  Laterality: Right;  120 mins  . LARYNX SURGERY     POLYP REMOVAL  . POLYPECTOMY     vocal cord  . TONSILLECTOMY  1965  . TUMOR REMOVAL     benign fatty tumors on abdomen    There were no vitals filed for this visit.  Subjective Assessment - 05/08/18 1116    Subjective  Reports that she had a youth service this weekend and may have overdone activity so she rested Stacey. Reports that she had less pain in R knee than she had over the week. Reports that she thinks maybe sciatic nerve also be irritated.    Pertinent History  DJD; Osteopenia; chronic back pain; COPD; Fibromyalgia and irregular heart beat.    Limitations  Walking    How long can you walk comfortably?  Short distances in community.    Patient Stated Goals  Get back to normal.  "I have a lot to do."  Currently in Pain?  Yes    Pain Score  4     Pain Location  Knee    Pain Orientation  Right    Pain Descriptors / Indicators  Discomfort    Pain Type  Surgical pain    Pain Onset  1 to 4 weeks ago         [facility] PT Assessment - 05/08/18 0001      Assessment   Medical Diagnosis  Right knee scope/medial sub chondroplasty.    Onset Date/Surgical Date  03/27/18    Next MD Visit  05/11/2018      Restrictions   Weight Bearing Restrictions  No      Observation/Other Assessments-Edema    Edema  Circumferential      Circumferential Edema   Circumferential - Right  55.8 cm    Circumferential - Left   55.8 cm      ROM / Strength   AROM / PROM / Strength  AROM      AROM   Overall AROM   Deficits    AROM Assessment Site  Knee    Right/Left Knee  Right    Right  Knee Extension  -5    Right Knee Flexion  110                   [facility] Adult PT Treatment/Exercise - 05/08/18 0001      Exercises   Exercises  Knee/Hip      Knee/Hip Exercises: Aerobic   Nustep  L5 x15 min      Knee/Hip Exercises: Standing   Hip Flexion  AROM;Both;20 reps;Knee bent    Hip Abduction  AROM;Right;15 reps;Knee straight    Rocker Board  2 minutes      Knee/Hip Exercises: Seated   Long Arc Quad  Right;2 sets;10 reps      Knee/Hip Exercises: Supine   Heel Slides  AROM;Right;20 reps    Straight Leg Raises  AROM;Right;Other (comment)   Attempted but unable secondary to R vastus medialis pain     Modalities   Modalities  Electrical Stimulation;Vasopneumatic      Electrical Stimulation   Electrical Stimulation Location  R knee    Electrical Stimulation Action  IFC    Electrical Stimulation Parameters  80-150 hz x15 min    Electrical Stimulation Goals  Edema;Pain      Vasopneumatic   Number Minutes Vasopneumatic   15 minutes    Vasopnuematic Location   Knee    Vasopneumatic Pressure  Low    Vasopneumatic Temperature   34               PT Short Term Goals - 04/26/18 1313      PT SHORT TERM GOAL #1   Title  Independent with an initial HEP.    Time  2    Period  Weeks    Status  Achieved   04/26/18     PT SHORT TERM GOAL #2   Title  Full active right knee extension.    Time  2    Period  Weeks    Status  On-going        PT Long Term Goals - 04/26/18 1314      PT LONG TERM GOAL #1   Title  Independent with an advanced HEP.    Time  4    Period  Weeks    Status  On-going      PT LONG TERM GOAL #2  Title  Active right knee flexion to 115 degrees+ so the patient can perform functional tasks and do so with pain not > 2-3/10.    Time  4    Period  Weeks    Status  On-going      PT LONG TERM GOAL #3   Title  Increase right knee strength to a solid 4+/5 to provide good stability for accomplishment of functional activities.     Period  Weeks    Status  On-going      PT LONG TERM GOAL #4   Title  Perform a reciprocating stair gait with one railing with pain not > 2-3/10.    Time  4    Period  Weeks    Status  On-going      PT LONG TERM GOAL #5   Title  Perform ADL's with pain not > 2/10.    Time  4    Period  Weeks    Status  On-going            Plan - 05/08/18 1154    Clinical Impression Statement  Patient tolerated today's treatment fairly well today as she reported reduced R knee pain. Patient reports global R knee discomfort as at times it is in posterior knee and along superior knee then inferior R knee. Patient limited with standing exercises due to LBP. Patient also limited with supine quad strengthening as SLR attempted but stopped as patient reported pain along R vastus medialis and rectus femoris proximal attachment. Improvement noted with R knee flexion and equalized B knee edema. Normal modalities response noted followig removal of the modalities.    Rehab Potential  Excellent    PT Frequency  3x / week    PT Duration  4 weeks    PT Treatment/Interventions  ADLs/Self Care Home Management;Cryotherapy;Electrical Stimulation;Ultrasound;Moist Heat;Gait training;Stair training;Functional mobility training;Therapeutic activities;Therapeutic exercise;Neuromuscular re-education;Patient/family education;Passive range of motion;Manual techniques;Vasopneumatic Device    PT Next Visit Plan  cont with POC for Pain-free right quad strengthening; vaso and e'stim. PRN    Consulted and Agree with Plan of Care  Patient       Patient will benefit from skilled therapeutic intervention in order to improve the following deficits and impairments:  Abnormal gait, Pain, Decreased activity tolerance, Decreased range of motion, Decreased strength  Visit Diagnosis: Acute pain of right knee  Stiffness of right knee, not elsewhere classified  Muscle weakness (generalized)     Problem List Patient Active Problem  List   Diagnosis Date Noted  . Complex tear of medial meniscus of right knee 03/27/2018  . Stress fracture of right tibia 03/27/2018  . Chronic pain of right knee 12/19/2017  . Genetic testing 03/08/2017  . Restrictive lung disease secondary to obesity 08/04/2015  . Physical deconditioning 08/04/2015  . OSA treated with BiPAP 08/04/2015  . Gastroesophageal reflux disease without esophagitis 12/25/2014  . Low serum vitamin D 05/28/2014  . Osteopenia 05/28/2014  . COPD (chronic obstructive pulmonary disease) ([place]) 01/29/2014  . Dyspnea on exertion 01/07/2014  . Fatigue 2013/03/06  . Murmur, cardiac 06-Mar-2013  . Diabetes ([place]) 03-06-13  . Morbid obesity ([place]) March 06, 2013  . Family history of sudden death Mar 06, 2013  . Hyperlipidemia 03/06/2013  . Hypertension 12/20/2012  . GAD (generalized anxiety disorder) 12/20/2012  . Fibromyalgia muscle pain 12/20/2012    Standley Brooking, PTA 05/08/2018, 12:04 PM  [facility] [address] [place], [place], [address] Phone: [phone]   Fax:  [phone]  Name: Stacey Summers MRN: [id number] Date of Birth: 1946/03/04

## 2018-05-09 ENCOUNTER — Ambulatory Visit: Payer: PPO | Admitting: Physical Therapy

## 2018-05-09 ENCOUNTER — Encounter: Payer: Self-pay | Admitting: Physical Therapy

## 2018-05-09 DIAGNOSIS — M25561 Pain in right knee: Secondary | ICD-10-CM

## 2018-05-09 DIAGNOSIS — M25661 Stiffness of right knee, not elsewhere classified: Secondary | ICD-10-CM

## 2018-05-09 DIAGNOSIS — M6281 Muscle weakness (generalized): Secondary | ICD-10-CM

## 2018-05-09 NOTE — Therapy (Signed)
[facility], [place], [address] Phone: [phone]   Fax:  [phone]  Physical Therapy Treatment  Patient Details  Name: Stacey Summers MRN: [id number] Date of Birth: August 30, 1945 Referring Provider: Victorino December MD.   Encounter Date: 05/09/2018  PT End of Session - 05/09/18 1133    Visit Number  9    Number of Visits  12    Date for PT Re-Evaluation  05/09/18    Authorization Type  FOTO AT LEAST EVERY 5TH VISIT, 10TH VISIT PROGRESS NOTE AND KX MODIFIER AFTER THE 15 VISIT.    PT Start Time  1126    PT Stop Time  1203    PT Time Calculation (min)  37 min    Activity Tolerance  Patient tolerated treatment well;Patient limited by pain    Behavior During Therapy  [facility] for tasks assessed/performed       Past Medical History:  Diagnosis Date  . Allergy    seasonal  . Anxiety   . Arthritis    hands, back   . Asthma    bronchitis  . Cataract    removed both eyes  . Chronic back pain    lower back   . COPD (chronic obstructive pulmonary disease) ([place])   . Diabetes mellitus without complication ([place])   . DJD (degenerative joint disease)   . Fibromyalgia   . Genetic testing 03/08/2017   Ms. Malenfant underwent genetic counseling and testing for hereditary cancer syndromes on 02/21/2017. Her results were negative for mutations in all 83 genes analyzed by [facility]'s 83-gene Multi-Cancers Panel. Genes analyzed include: ALK, APC, ATM, AXIN2, BAP1, BARD1, BLM, BMPR1A, BRCA1, BRCA2, BRIP1, CASR, CDC73, CDH1, CDK4, CDKN1B, CDKN1C, CDKN2A, CEBPA, CHEK2, CTNNA1, DICER1, DIS3L2, EGFR, EPCAM  . GERD (gastroesophageal reflux disease)   . History of abnormal mammogram   . Hyperlipidemia   . Hypertension   . Irregular heart beat   . Neuromuscular disorder (HCC)    fibromyalgia  . Sleep apnea    cpap some  . Tubular adenoma of colon 05/09/12   "innumerable number" of flat polyps    Past Surgical History:  Procedure  Laterality Date  . CARPAL TUNNEL RELEASE Left 10/20/14   [facility]  . [place]  . CHOLECYSTECTOMY  1990  . COLONOSCOPY    . KNEE ARTHROSCOPY WITH SUBCHONDROPLASTY Right 03/27/2018   Procedure: Right knee arthroscopic partial medial meniscus repair versus menisectomy with medial tibia subchondroplasty;  Surgeon: Nicholes Stairs, MD;  Location: [facility];  Service: Orthopedics;  Laterality: Right;  120 mins  . LARYNX SURGERY     POLYP REMOVAL  . POLYPECTOMY     vocal cord  . TONSILLECTOMY  1965  . TUMOR REMOVAL     benign fatty tumors on abdomen    There were no vitals filed for this visit.  Subjective Assessment - 05/09/18 1129    Subjective  Patient arrived with some soreness due to increased activity yesterday    Pertinent History  DJD; Osteopenia; chronic back pain; COPD; Fibromyalgia and irregular heart beat.    Limitations  Walking    How long can you walk comfortably?  Short distances in community.    Patient Stated Goals  Get back to normal.  "I have a lot to do."    Currently in Pain?  Yes    Pain Score  4     Pain Location  Knee    Pain Orientation  Right  Pain Descriptors / Indicators  Discomfort    Pain Type  Surgical pain    Pain Onset  1 to 4 weeks ago    Pain Frequency  Constant    Aggravating Factors   increased activity    Pain Relieving Factors  at rest         [facility] PT Assessment - 05/09/18 0001      ROM / Strength   AROM / PROM / Strength  AROM;PROM      AROM   AROM Assessment Site  Knee    Right/Left Knee  Right    Right Knee Extension  -10    Right Knee Flexion  115      PROM   PROM Assessment Site  Knee    Right/Left Knee  Right    Right Knee Extension  -5    Right Knee Flexion  120                   [facility] Adult PT Treatment/Exercise - 05/09/18 0001      Knee/Hip Exercises: Aerobic   Nustep  54mn L5 UE/LE, monitored for progression      Knee/Hip Exercises: Supine   Short Arc Quad Sets   Strengthening;Right;3 sets;10 reps    Hip Adduction Isometric  Strengthening;Both;2 sets;10 reps   grey ball   Other Supine Knee/Hip Exercises  B hip clam red theraband x20 reps      EAcupuncturistLocation  R knee    Electrical Stimulation Action  IFC    Electrical Stimulation Parameters  1-10hz  x167m    Electrical Stimulation Goals  Edema;Pain      Vasopneumatic   Number Minutes Vasopneumatic   15 minutes    Vasopnuematic Location   Knee    Vasopneumatic Pressure  Low      Manual Therapy   Manual Therapy  Passive ROM    Passive ROM  gentle ROM to increase range within tolerance, focus on ext today               PT Short Term Goals - 05/09/18 1136      PT SHORT TERM GOAL #1   Title  Independent with an initial HEP.    Time  2    Period  Weeks    Status  Achieved      PT SHORT TERM GOAL #2   Title  Full active right knee extension.    Time  2    Period  Weeks    Status  On-going   AROM -10 degrees 05/09/18       PT Long Term Goals - 05/09/18 1134      PT LONG TERM GOAL #1   Title  Independent with an advanced HEP.    Time  4    Period  Weeks    Status  On-going      PT LONG TERM GOAL #2   Title  Active right knee flexion to 115 degrees+ so the patient can perform functional tasks and do so with pain not > 2-3/10.    Baseline  MET 05/09/18    Time  4    Period  Weeks    Status  Achieved      PT LONG TERM GOAL #3   Title  Increase right knee strength to a solid 4+/5 to provide good stability for accomplishment of functional activities.    Time  4    Period  Weeks  Status  On-going      PT LONG TERM GOAL #4   Title  Perform a reciprocating stair gait with one railing with pain not > 2-3/10.    Time  4    Period  Weeks    Status  On-going      PT LONG TERM GOAL #5   Title  Perform ADL's with pain not > 2/10.    Time  4    Period  Weeks    Status  On-going   4+/10 with ADL's 05/09/18           Plan -  05/09/18 1151    Clinical Impression Statement  Patient tolerated treatment fair due to reported increased soreness from increased activity yesterday. Patient has improved with ROM for flexion today, with some ongoing lack of ext in right knee. Today focused on gentle ROM and gentle low level exericses due to soreness. Patient reported increased pain with ADL's and going up/down stairs. Met LTG #2 today and others ongoing.     Rehab Potential  Excellent    PT Frequency  3x / week    PT Duration  4 weeks    PT Treatment/Interventions  ADLs/Self Care Home Management;Cryotherapy;Electrical Stimulation;Ultrasound;Moist Heat;Gait training;Stair training;Functional mobility training;Therapeutic activities;Therapeutic exercise;Neuromuscular re-education;Patient/family education;Passive range of motion;Manual techniques;Vasopneumatic Device    PT Next Visit Plan  cont with POC for Pain-free right quad strengthening; vaso and e'stim. PRN MD note sent today for F/U    Consulted and Agree with Plan of Care  Patient       Patient will benefit from skilled therapeutic intervention in order to improve the following deficits and impairments:  Abnormal gait, Pain, Decreased activity tolerance, Decreased range of motion, Decreased strength  Visit Diagnosis: Acute pain of right knee  Stiffness of right knee, not elsewhere classified  Muscle weakness (generalized)     Problem List Patient Active Problem List   Diagnosis Date Noted  . Complex tear of medial meniscus of right knee 03/27/2018  . Stress fracture of right tibia 03/27/2018  . Chronic pain of right knee 12/19/2017  . Genetic testing 03/08/2017  . Restrictive lung disease secondary to obesity 08/04/2015  . Physical deconditioning 08/04/2015  . OSA treated with BiPAP 08/04/2015  . Gastroesophageal reflux disease without esophagitis 12/25/2014  . Low serum vitamin D 05/28/2014  . Osteopenia 05/28/2014  . COPD (chronic obstructive pulmonary  disease) ([place]) 01/29/2014  . Dyspnea on exertion 01/07/2014  . Fatigue 03/04/13  . Murmur, cardiac 04-Mar-2013  . Diabetes ([place]) 2013-03-04  . Morbid obesity ([place]) 2013-03-04  . Family history of sudden death Mar 04, 2013  . Hyperlipidemia 2013-03-04  . Hypertension 12/20/2012  . GAD (generalized anxiety disorder) 12/20/2012  . Fibromyalgia muscle pain 12/20/2012   Ladean Raya, PTA 05/09/18 12:04 PM  [facility] [place], [place], [address] Phone: [phone]   Fax:  [phone]  Name: MESA JANUS MRN: [id number] Date of Birth: 1946/07/09

## 2018-05-10 ENCOUNTER — Other Ambulatory Visit: Payer: Self-pay | Admitting: *Deleted

## 2018-05-10 ENCOUNTER — Encounter: Payer: PPO | Admitting: Physical Therapy

## 2018-05-10 DIAGNOSIS — K219 Gastro-esophageal reflux disease without esophagitis: Secondary | ICD-10-CM

## 2018-05-10 MED ORDER — OMEPRAZOLE 40 MG PO CPDR
40.0000 mg | DELAYED_RELEASE_CAPSULE | Freq: Every day | ORAL | 0 refills | Status: DC
Start: 2018-05-10 — End: 2018-10-09

## 2018-05-15 ENCOUNTER — Ambulatory Visit: Payer: PPO | Attending: Orthopedic Surgery | Admitting: Physical Therapy

## 2018-05-15 ENCOUNTER — Encounter: Payer: Self-pay | Admitting: Physical Therapy

## 2018-05-15 DIAGNOSIS — M6281 Muscle weakness (generalized): Secondary | ICD-10-CM | POA: Diagnosis not present

## 2018-05-15 DIAGNOSIS — M25561 Pain in right knee: Secondary | ICD-10-CM | POA: Insufficient documentation

## 2018-05-15 DIAGNOSIS — M25661 Stiffness of right knee, not elsewhere classified: Secondary | ICD-10-CM | POA: Insufficient documentation

## 2018-05-15 NOTE — Therapy (Signed)
[facility] [place], [place], [address] Phone: [phone]   Fax:  [phone]  Physical Therapy Treatment  Patient Details  Name: Stacey Summers MRN: [id number] Date of Birth: May 27, 1946 Referring Provider (PT): Victorino December MD.   Encounter Date: 05/15/2018  PT End of Session - 05/15/18 1043    Visit Number  10    Number of Visits  12    Date for PT Re-Evaluation  05/09/18    Authorization Type  FOTO AT LEAST EVERY 5TH VISIT, 10TH VISIT PROGRESS NOTE AND KX MODIFIER AFTER THE 15 VISIT.    PT Start Time  1038    PT Stop Time  1122    PT Time Calculation (min)  44 min    Activity Tolerance  Patient tolerated treatment well;Patient limited by pain    Behavior During Therapy  [facility] for tasks assessed/performed       Past Medical History:  Diagnosis Date  . Allergy    seasonal  . Anxiety   . Arthritis    hands, back   . Asthma    bronchitis  . Cataract    removed both eyes  . Chronic back pain    lower back   . COPD (chronic obstructive pulmonary disease) ([place])   . Diabetes mellitus without complication ([place])   . DJD (degenerative joint disease)   . Fibromyalgia   . Genetic testing 03/08/2017   Ms. Persing underwent genetic counseling and testing for hereditary cancer syndromes on 02/21/2017. Her results were negative for mutations in all 83 genes analyzed by [facility]'s 83-gene Multi-Cancers Panel. Genes analyzed include: ALK, APC, ATM, AXIN2, BAP1, BARD1, BLM, BMPR1A, BRCA1, BRCA2, BRIP1, CASR, CDC73, CDH1, CDK4, CDKN1B, CDKN1C, CDKN2A, CEBPA, CHEK2, CTNNA1, DICER1, DIS3L2, EGFR, EPCAM  . GERD (gastroesophageal reflux disease)   . History of abnormal mammogram   . Hyperlipidemia   . Hypertension   . Irregular heart beat   . Neuromuscular disorder (HCC)    fibromyalgia  . Sleep apnea    cpap some  . Tubular adenoma of colon 05/09/12   "innumerable number" of flat polyps    Past Surgical History:  Procedure  Laterality Date  . CARPAL TUNNEL RELEASE Left 10/20/14   [facility]  . [place]  . CHOLECYSTECTOMY  1990  . COLONOSCOPY    . KNEE ARTHROSCOPY WITH SUBCHONDROPLASTY Right 03/27/2018   Procedure: Right knee arthroscopic partial medial meniscus repair versus menisectomy with medial tibia subchondroplasty;  Surgeon: Nicholes Stairs, MD;  Location: [place];  Service: Orthopedics;  Laterality: Right;  120 mins  . LARYNX SURGERY     POLYP REMOVAL  . POLYPECTOMY     vocal cord  . TONSILLECTOMY  1965  . TUMOR REMOVAL     benign fatty tumors on abdomen    There were no vitals filed for this visit.  Subjective Assessment - 05/15/18 1041    Subjective  Reports that her low back has been bothering her lately. Reports that MD said to return in two months and that soreness will eventually go away.    Pertinent History  DJD; Osteopenia; chronic back pain; COPD; Fibromyalgia and irregular heart beat.    Limitations  Walking    How long can you walk comfortably?  Short distances in community.    Patient Stated Goals  Get back to normal.  "I have a lot to do."    Currently in Pain?  Yes    Pain Score  4  Pain Location  Knee    Pain Orientation  Right    Pain Descriptors / Indicators  Sore    Pain Type  Surgical pain    Pain Onset  1 to 4 weeks ago    Pain Frequency  Intermittent         [facility] PT Assessment - 05/15/18 0001      Assessment   Medical Diagnosis  Right knee scope/medial sub chondroplasty.    Onset Date/Surgical Date  03/27/18    Next MD Visit  06/2018      Restrictions   Weight Bearing Restrictions  No                   [facility] Adult PT Treatment/Exercise - 05/15/18 0001      Knee/Hip Exercises: Aerobic   Nustep  33mn L5 UE/LE, monitored for progression      Knee/Hip Exercises: Seated   Long Arc Quad  Strengthening;Right;2 sets;10 reps;Weights    Long Arc Quad Weight  3 lbs.      Knee/Hip Exercises: Supine   Heel Slides  AROM;Right;20  reps    Straight Leg Raises  Other (comment)   Attempted but unable secondary to sore     Modalities   Modalities  Electrical Stimulation;Vasopneumatic      Electrical Stimulation   Electrical Stimulation Location  R knee    Electrical Stimulation Action  IFC    Electrical Stimulation Parameters  80-150 hz x15 min    Electrical Stimulation Goals  Edema;Pain      Vasopneumatic   Number Minutes Vasopneumatic   15 minutes    Vasopnuematic Location   Knee    Vasopneumatic Pressure  Low    Vasopneumatic Temperature   34               PT Short Term Goals - 05/09/18 1136      PT SHORT TERM GOAL #1   Title  Independent with an initial HEP.    Time  2    Period  Weeks    Status  Achieved      PT SHORT TERM GOAL #2   Title  Full active right knee extension.    Time  2    Period  Weeks    Status  On-going   AROM -10 degrees 05/09/18       PT Long Term Goals - 05/09/18 1134      PT LONG TERM GOAL #1   Title  Independent with an advanced HEP.    Time  4    Period  Weeks    Status  On-going      PT LONG TERM GOAL #2   Title  Active right knee flexion to 115 degrees+ so the patient can perform functional tasks and do so with pain not > 2-3/10.    Baseline  MET 05/09/18    Time  4    Period  Weeks    Status  Achieved      PT LONG TERM GOAL #3   Title  Increase right knee strength to a solid 4+/5 to provide good stability for accomplishment of functional activities.    Time  4    Period  Weeks    Status  On-going      PT LONG TERM GOAL #4   Title  Perform a reciprocating stair gait with one railing with pain not > 2-3/10.    Time  4    Period  Weeks  Status  On-going      PT LONG TERM GOAL #5   Title  Perform ADL's with pain not > 2/10.    Time  4    Period  Weeks    Status  On-going   4+/10 with ADL's 05/09/18           Plan - 05/15/18 1149    Clinical Impression Statement  Patient overall limited due to late arrival but also reports of  increased discomfort in low back as well as RLE. Patient experienced soreness and weakness of R quad with LAQ which also limited SLR attempts. Patient experiencing overall soreness recently. Normal modalities response noted following removal of the modalities.    Rehab Potential  Excellent    PT Frequency  3x / week    PT Duration  4 weeks    PT Treatment/Interventions  ADLs/Self Care Home Management;Cryotherapy;Electrical Stimulation;Ultrasound;Moist Heat;Gait training;Stair training;Functional mobility training;Therapeutic activities;Therapeutic exercise;Neuromuscular re-education;Patient/family education;Passive range of motion;Manual techniques;Vasopneumatic Device    PT Next Visit Plan  cont with POC for Pain-free right quad strengthening; vaso and e'stim. PRN MD note sent today for F/U    Consulted and Agree with Plan of Care  Patient       Patient will benefit from skilled therapeutic intervention in order to improve the following deficits and impairments:  Abnormal gait, Pain, Decreased activity tolerance, Decreased range of motion, Decreased strength  Visit Diagnosis: Acute pain of right knee  Stiffness of right knee, not elsewhere classified  Muscle weakness (generalized)     Problem List Patient Active Problem List   Diagnosis Date Noted  . Complex tear of medial meniscus of right knee 03/27/2018  . Stress fracture of right tibia 03/27/2018  . Chronic pain of right knee 12/19/2017  . Genetic testing 03/08/2017  . Restrictive lung disease secondary to obesity 08/04/2015  . Physical deconditioning 08/04/2015  . OSA treated with BiPAP 08/04/2015  . Gastroesophageal reflux disease without esophagitis 12/25/2014  . Low serum vitamin D 05/28/2014  . Osteopenia 05/28/2014  . COPD (chronic obstructive pulmonary disease) ([place]) 01/29/2014  . Dyspnea on exertion 01/07/2014  . Fatigue 03-09-13  . Murmur, cardiac 2013-03-09  . Diabetes ([place]) 03/09/13  . Morbid obesity ([place])  March 09, 2013  . Family history of sudden death Mar 09, 2013  . Hyperlipidemia Mar 09, 2013  . Hypertension 12/20/2012  . GAD (generalized anxiety disorder) 12/20/2012  . Fibromyalgia muscle pain 12/20/2012    Standley Brooking, PTA 05/15/2018, 12:19 PM  [facility] [address] [place], [place], [address] Phone: [phone]   Fax:  [phone]  Name: Stacey Summers MRN: [id number] Date of Birth: Aug 15, 1946

## 2018-05-17 ENCOUNTER — Encounter: Payer: Self-pay | Admitting: Physical Therapy

## 2018-05-17 ENCOUNTER — Ambulatory Visit: Payer: PPO | Admitting: Physical Therapy

## 2018-05-17 DIAGNOSIS — M25561 Pain in right knee: Secondary | ICD-10-CM | POA: Diagnosis not present

## 2018-05-17 DIAGNOSIS — M25661 Stiffness of right knee, not elsewhere classified: Secondary | ICD-10-CM

## 2018-05-17 DIAGNOSIS — M6281 Muscle weakness (generalized): Secondary | ICD-10-CM

## 2018-05-17 NOTE — Therapy (Signed)
[facility] [address] [place], [place], [address] Phone: [phone]   Fax:  [phone]  Physical Therapy Progress note and Recertification Progress Note reporting period 04/11/18 to 05/17/18.  See below for objective and subjective measurements relating to patients progress with PT.   Patient Details  Name: Stacey Summers MRN: [id number] Date of Birth: 02/03/1946 Referring Provider (PT): Jason Rogers MD.   Encounter Date: 05/17/2018  PT End of Session - 05/17/18 1202    Visit Number  11    Number of Visits  12    Date for PT Re-Evaluation  06/14/18    Authorization Type  FOTO AT LEAST EVERY 5TH VISIT, 10TH VISIT PROGRESS NOTE AND KX MODIFIER AFTER THE 15 VISIT.    PT Start Time  1120    PT Stop Time  1205    PT Time Calculation (min)  45 min    Activity Tolerance  Patient tolerated treatment well    Behavior During Therapy  WFL for tasks assessed/performed       Past Medical History:  Diagnosis Date  . Allergy    seasonal  . Anxiety   . Arthritis    hands, back   . Asthma    bronchitis  . Cataract    removed both eyes  . Chronic back pain    lower back   . COPD (chronic obstructive pulmonary disease) (HCC)   . Diabetes mellitus without complication (HCC)   . DJD (degenerative joint disease)   . Fibromyalgia   . Genetic testing 03/08/2017   Ms. Kohler underwent genetic counseling and testing for hereditary cancer syndromes on 02/21/2017. Her results were negative for mutations in all 83 genes analyzed by [facility]'s 83-gene Multi-Cancers Panel. Genes analyzed include: ALK, APC, ATM, AXIN2, BAP1, BARD1, BLM, BMPR1A, BRCA1, BRCA2, BRIP1, CASR, CDC73, CDH1, CDK4, CDKN1B, CDKN1C, CDKN2A, CEBPA, CHEK2, CTNNA1, DICER1, DIS3L2, EGFR, EPCAM  . GERD (gastroesophageal reflux disease)   . History of abnormal mammogram   . Hyperlipidemia   . Hypertension   . Irregular heart beat   . Neuromuscular disorder (HCC)     fibromyalgia  . Sleep apnea    cpap some  . Tubular adenoma of colon 05/09/12   "innumerable number" of flat polyps    Past Surgical History:  Procedure Laterality Date  . CARPAL TUNNEL RELEASE Left 10/20/14   [facility]  . CESAREAN SECTION  1977  . CHOLECYSTECTOMY  1990  . COLONOSCOPY    . KNEE ARTHROSCOPY WITH SUBCHONDROPLASTY Right 03/27/2018   Procedure: Right knee arthroscopic partial medial meniscus repair versus menisectomy with medial tibia subchondroplasty;  Surgeon: Rogers, Jason Patrick, MD;  Location[facility];  Service: Orthopedics;  Laterality: Right;  120 mins  . LARYNX SURGERY     POLYP REMOVAL  . POLYPECTOMY     vocal cord  . TONSILLECTOMY  1965  . TUMOR REMOVAL     benign fatty tumors on abdomen    There were no vitals filed for this visit.  Subjective Assessment - 05/17/18 1202    Subjective  Pt relays about 4/10 pain and soreness in Rt knee and back    Currently in Pain?  Yes    Pain Score  4     Pain Location  Knee    Pain Orientation  Right         [facility] PT Assessment - 05/17/18 0001      Assessment   Medical Diagnosis  Right knee scope/medial sub chondroplasty.      Onset Date/Surgical Date  03/27/18    Next MD Visit  06/2018      ROM / Strength   AROM / PROM / Strength  AROM;Strength      AROM   Right Knee Extension  -8    Right Knee Flexion  115      Strength   Overall Strength Comments  Rt hip strength flex/abd 4/5    Strength Assessment Site  Knee    Right/Left Knee  Right    Right Knee Flexion  5/5    Right Knee Extension  4+/5                   [facility] Adult PT Treatment/Exercise - 05/17/18 0001      Exercises   Exercises  Knee/Hip      Knee/Hip Exercises: Aerobic   Nustep  15 min L5 UE/LE, monitored for progression      Knee/Hip Exercises: Seated   Long Arc Quad  Strengthening;Right;2 sets;10 reps;Weights    Long Arc Quad Weight  3 lbs.      Knee/Hip Exercises: Supine   Quad Sets  Right;2 sets;10 reps    Quad Sets  Limitations  hold 5 sec    Heel Slides  AROM;Right;20 reps    Hip Adduction Isometric  20 reps    Straight Leg Raises  --   attempted but increased back pain so this was held     Modalities   Modalities  Electrical Stimulation;Cryotherapy      Cryotherapy   Number Minutes Cryotherapy  12 Minutes    Cryotherapy Location  Knee    Type of Cryotherapy  Ice pack      Electrical Stimulation   Electrical Stimulation Location  R knee    Electrical Stimulation Action  IFC    Electrical Stimulation Parameters  80-150 hz to tolerance    Electrical Stimulation Goals  Edema;Pain               PT Short Term Goals - 05/09/18 1136      PT SHORT TERM GOAL #1   Title  Independent with an initial HEP.    Time  2    Period  Weeks    Status  Achieved      PT SHORT TERM GOAL #2   Title  Full active right knee extension.    Time  2    Period  Weeks    Status  On-going   AROM -10 degrees 05/09/18       PT Long Term Goals - 05/17/18 1245      PT LONG TERM GOAL #1   Title  Independent with an advanced HEP.    Time  4    Period  Weeks    Status  On-going      PT LONG TERM GOAL #2   Title  Active right knee flexion to 115 degrees+ so the patient can perform functional tasks and do so with pain not > 2-3/10.    Baseline  MET 05/09/18    Time  4    Period  Weeks    Status  Achieved      PT LONG TERM GOAL #3   Title  Increase right knee strength to a solid 4+/5 to provide good stability for accomplishment of functional activities.    Baseline  Met 05/17/18    Time  4    Period  Weeks    Status  Achieved        PT LONG TERM GOAL #4   Title  Perform a reciprocating stair gait with one railing with pain not > 2-3/10.    Baseline  4/10 overall pain with stairs and has to go slow    Time  4    Period  Weeks    Status  On-going      PT LONG TERM GOAL #5   Title  Perform ADL's with pain not > 2/10.    Baseline  4/10 pain overall    Time  4    Period  Weeks    Status  On-going             Plan - 05/17/18 1204    Clinical Impression Statement  Progress note/Recert performed today show improved knee strength and knee flexion ROM, but she still has deficits with knee ext ROM, and hip weakness, as well as activity toelrance. She has now met 2/5 Long term goals. She will continue to benefit from skilled PT another 4 weeks to address her remaining deficits.     Rehab Potential  Good    PT Frequency  3x / week   2-3   PT Duration  4 weeks    PT Treatment/Interventions  ADLs/Self Care Home Management;Cryotherapy;Electrical Stimulation;Ultrasound;Moist Heat;Gait training;Stair training;Functional mobility training;Therapeutic activities;Therapeutic exercise;Neuromuscular re-education;Patient/family education;Passive range of motion;Manual techniques;Vasopneumatic Device    PT Next Visit Plan  cont with POC for another 4 weeks to inc strength, ROM, activity tolerance and decrease pain.    Consulted and Agree with Plan of Care  Patient       Patient will benefit from skilled therapeutic intervention in order to improve the following deficits and impairments:  Abnormal gait, Pain, Decreased activity tolerance, Decreased range of motion, Decreased strength  Visit Diagnosis: Acute pain of right knee  Stiffness of right knee, not elsewhere classified  Muscle weakness (generalized)     Problem List Patient Active Problem List   Diagnosis Date Noted  . Complex tear of medial meniscus of right knee 03/27/2018  . Stress fracture of right tibia 03/27/2018  . Chronic pain of right knee 12/19/2017  . Genetic testing 03/08/2017  . Restrictive lung disease secondary to obesity 08/04/2015  . Physical deconditioning 08/04/2015  . OSA treated with BiPAP 08/04/2015  . Gastroesophageal reflux disease without esophagitis 12/25/2014  . Low serum vitamin D 05/28/2014  . Osteopenia 05/28/2014  . COPD (chronic obstructive pulmonary disease) (HCC) 01/29/2014  . Dyspnea on  exertion 01/07/2014  . Fatigue 02/25/2013  . Murmur, cardiac 02/25/2013  . Diabetes (HCC) 02/25/2013  . Morbid obesity (HCC) 02/25/2013  . Family history of sudden death 02/25/2013  . Hyperlipidemia 02/25/2013  . Hypertension 12/20/2012  . GAD (generalized anxiety disorder) 12/20/2012  . Fibromyalgia muscle pain 12/20/2012    Brian R Nelson, PT, DPT 05/17/2018, 12:49 PM  [facility] [address] [place], [place], [address] Phone: [phone]   Fax:  [phone]  Name: Stacey Summers MRN: [id number] Date of Birth: 12/10/1945   

## 2018-05-17 NOTE — Addendum Note (Signed)
Addended by: Debbe Odea on: 05/17/2018 12:54 PM   Modules accepted: Orders

## 2018-05-22 ENCOUNTER — Other Ambulatory Visit: Payer: Self-pay | Admitting: Nurse Practitioner

## 2018-05-22 ENCOUNTER — Encounter: Payer: PPO | Admitting: Physical Therapy

## 2018-05-22 DIAGNOSIS — Z1231 Encounter for screening mammogram for malignant neoplasm of breast: Secondary | ICD-10-CM

## 2018-05-24 ENCOUNTER — Encounter: Payer: PPO | Admitting: Physical Therapy

## 2018-05-28 ENCOUNTER — Ambulatory Visit (HOSPITAL_COMMUNITY)
Admission: RE | Admit: 2018-05-28 | Discharge: 2018-05-28 | Disposition: A | Discharge status: Home / Self Care | Payer: PPO | Source: Ambulatory Visit | Attending: Nurse Practitioner | Admitting: Nurse Practitioner

## 2018-05-28 DIAGNOSIS — Z1231 Encounter for screening mammogram for malignant neoplasm of breast: Secondary | ICD-10-CM | POA: Diagnosis not present

## 2018-05-29 ENCOUNTER — Encounter: Payer: Self-pay | Admitting: Physical Therapy

## 2018-05-29 ENCOUNTER — Ambulatory Visit: Payer: PPO | Admitting: Physical Therapy

## 2018-05-29 DIAGNOSIS — M25561 Pain in right knee: Secondary | ICD-10-CM

## 2018-05-29 DIAGNOSIS — M25661 Stiffness of right knee, not elsewhere classified: Secondary | ICD-10-CM

## 2018-05-29 DIAGNOSIS — M6281 Muscle weakness (generalized): Secondary | ICD-10-CM

## 2018-05-29 NOTE — Therapy (Signed)
[facility] [place], [place], [address] Phone: [phone]   Fax:  [phone]  Physical Therapy Treatment  Patient Details  Name: Stacey Summers MRN: [id number] Date of Birth: 10/14/1945 Referring Provider (PT): Victorino December MD.   Encounter Date: 05/29/2018  PT End of Session - 05/29/18 1243    Visit Number  12    Number of Visits  12    Date for PT Re-Evaluation  06/14/18    Authorization Type  FOTO AT LEAST EVERY 5TH VISIT, 10TH VISIT PROGRESS NOTE AND KX MODIFIER AFTER THE 15 VISIT.    PT Start Time  1119    PT Stop Time  1203    PT Time Calculation (min)  44 min    Activity Tolerance  Patient tolerated treatment well    Behavior During Therapy  WFL for tasks assessed/performed       Past Medical History:  Diagnosis Date  . Allergy    seasonal  . Anxiety   . Arthritis    hands, back   . Asthma    bronchitis  . Cataract    removed both eyes  . Chronic back pain    lower back   . COPD (chronic obstructive pulmonary disease) ([place])   . Diabetes mellitus without complication ([place])   . DJD (degenerative joint disease)   . Fibromyalgia   . Genetic testing 03/08/2017   Ms. Pinkley underwent genetic counseling and testing for hereditary cancer syndromes on 02/21/2017. Her results were negative for mutations in all 83 genes analyzed by [facility]'s 83-gene Multi-Cancers Panel. Genes analyzed include: ALK, APC, ATM, AXIN2, BAP1, BARD1, BLM, BMPR1A, BRCA1, BRCA2, BRIP1, CASR, CDC73, CDH1, CDK4, CDKN1B, CDKN1C, CDKN2A, CEBPA, CHEK2, CTNNA1, DICER1, DIS3L2, EGFR, EPCAM  . GERD (gastroesophageal reflux disease)   . History of abnormal mammogram   . Hyperlipidemia   . Hypertension   . Irregular heart beat   . Neuromuscular disorder (HCC)    fibromyalgia  . Sleep apnea    cpap some  . Tubular adenoma of colon 05/09/12   "innumerable number" of flat polyps    Past Surgical History:  Procedure Laterality Date  .  CARPAL TUNNEL RELEASE Left 10/20/14   [facility]  . [place]  . CHOLECYSTECTOMY  1990  . COLONOSCOPY    . KNEE ARTHROSCOPY WITH SUBCHONDROPLASTY Right 03/27/2018   Procedure: Right knee arthroscopic partial medial meniscus repair versus menisectomy with medial tibia subchondroplasty;  Surgeon: Nicholes Stairs, MD;  Location: [facility];  Service: Orthopedics;  Laterality: Right;  120 mins  . LARYNX SURGERY     POLYP REMOVAL  . POLYPECTOMY     vocal cord  . TONSILLECTOMY  1965  . TUMOR REMOVAL     benign fatty tumors on abdomen    There were no vitals filed for this visit.  Subjective Assessment - 05/29/18 1239    Subjective  I'm hurting a lot today.  I wonder if some of this is because of my back.    Pain Score  7     Pain Location  Knee    Pain Orientation  Right    Pain Descriptors / Indicators  Sore    Pain Onset  1 to 4 weeks ago                       [facility] Adult PT Treatment/Exercise - 05/29/18 0001      Modalities   Modalities  Electrical  Stimulation;Ultrasound;Vasopneumatic      Acupuncturist Location  Right knee.    Electrical Stimulation Action  Pre-mod    Electrical Stimulation Parameters  80-150 Hz x 20 minutes.    Electrical Stimulation Goals  Edema;Pain      Ultrasound   Ultrasound Location  Right medial knee superior to medial joint line/distal femur.    Ultrasound Parameters  Low-level combo e'stim/U/S at 1.0 W/cm2 x 8 minutes f/b application of Biofreeze.               PT Short Term Goals - 05/09/18 1136      PT SHORT TERM GOAL #1   Title  Independent with an initial HEP.    Time  2    Period  Weeks    Status  Achieved      PT SHORT TERM GOAL #2   Title  Full active right knee extension.    Time  2    Period  Weeks    Status  On-going   AROM -10 degrees 05/09/18       PT Long Term Goals - 05/17/18 1245      PT LONG TERM GOAL #1   Title  Independent with an advanced HEP.     Time  4    Period  Weeks    Status  On-going      PT LONG TERM GOAL #2   Title  Active right knee flexion to 115 degrees+ so the patient can perform functional tasks and do so with pain not > 2-3/10.    Baseline  MET 05/09/18    Time  4    Period  Weeks    Status  Achieved      PT LONG TERM GOAL #3   Title  Increase right knee strength to a solid 4+/5 to provide good stability for accomplishment of functional activities.    Baseline  Met 05/17/18    Time  4    Period  Weeks    Status  Achieved      PT LONG TERM GOAL #4   Title  Perform a reciprocating stair gait with one railing with pain not > 2-3/10.    Baseline  4/10 overall pain with stairs and has to go slow    Time  4    Period  Weeks    Status  On-going      PT LONG TERM GOAL #5   Title  Perform ADL's with pain not > 2/10.    Baseline  4/10 pain overall    Time  4    Period  Weeks    Status  On-going            Plan - 05/29/18 1249    Clinical Impression Statement  Patient presented to the clinic today with a high pain-level.  She responded very well to treatment today and a significantly reduction in pain follwing treatment.  Patient was also provided with information on obtaing a TENS unit.    PT Treatment/Interventions  ADLs/Self Care Home Management;Cryotherapy;Electrical Stimulation;Ultrasound;Moist Heat;Gait training;Stair training;Functional mobility training;Therapeutic activities;Therapeutic exercise;Neuromuscular re-education;Patient/family education;Passive range of motion;Manual techniques;Vasopneumatic Device    PT Next Visit Plan  cont with POC for another 4 weeks to inc strength, ROM, activity tolerance and decrease pain.    Consulted and Agree with Plan of Care  Patient       Patient will benefit from skilled therapeutic intervention in order to improve the following deficits  and impairments:     Visit Diagnosis: Acute pain of right knee  Stiffness of right knee, not elsewhere  classified  Muscle weakness (generalized)     Problem List Patient Active Problem List   Diagnosis Date Noted  . Complex tear of medial meniscus of right knee 03/27/2018  . Stress fracture of right tibia 03/27/2018  . Chronic pain of right knee 12/19/2017  . Genetic testing 03/08/2017  . Restrictive lung disease secondary to obesity 08/04/2015  . Physical deconditioning 08/04/2015  . OSA treated with BiPAP 08/04/2015  . Gastroesophageal reflux disease without esophagitis 12/25/2014  . Low serum vitamin D 05/28/2014  . Osteopenia 05/28/2014  . COPD (chronic obstructive pulmonary disease) ([place]) 01/29/2014  . Dyspnea on exertion 01/07/2014  . Fatigue 03/10/13  . Murmur, cardiac 03/10/13  . Diabetes ([place]) 03/10/2013  . Morbid obesity ([place]) 03-10-13  . Family history of sudden death Mar 10, 2013  . Hyperlipidemia 2013/03/10  . Hypertension 12/20/2012  . GAD (generalized anxiety disorder) 12/20/2012  . Fibromyalgia muscle pain 12/20/2012    Jerzy Crotteau, [place] 05/29/2018, 12:53 PM  [facility] [address] [place], [place], [address] Phone: [phone]   Fax:  [phone]  Name: Stacey Summers MRN: [id number] Date of Birth: 1946-07-12

## 2018-05-30 ENCOUNTER — Inpatient Hospital Stay (HOSPITAL_COMMUNITY): Payer: PPO | Attending: Hematology

## 2018-05-30 DIAGNOSIS — J449 Chronic obstructive pulmonary disease, unspecified: Secondary | ICD-10-CM | POA: Diagnosis not present

## 2018-05-30 DIAGNOSIS — Z7984 Long term (current) use of oral hypoglycemic drugs: Secondary | ICD-10-CM | POA: Diagnosis not present

## 2018-05-30 DIAGNOSIS — G4733 Obstructive sleep apnea (adult) (pediatric): Secondary | ICD-10-CM | POA: Insufficient documentation

## 2018-05-30 DIAGNOSIS — Z800 Family history of malignant neoplasm of digestive organs: Secondary | ICD-10-CM | POA: Insufficient documentation

## 2018-05-30 DIAGNOSIS — R948 Abnormal results of function studies of other organs and systems: Secondary | ICD-10-CM | POA: Insufficient documentation

## 2018-05-30 DIAGNOSIS — Z8051 Family history of malignant neoplasm of kidney: Secondary | ICD-10-CM | POA: Diagnosis not present

## 2018-05-30 DIAGNOSIS — Z8042 Family history of malignant neoplasm of prostate: Secondary | ICD-10-CM | POA: Diagnosis not present

## 2018-05-30 DIAGNOSIS — M8580 Other specified disorders of bone density and structure, unspecified site: Secondary | ICD-10-CM | POA: Diagnosis not present

## 2018-05-30 DIAGNOSIS — E119 Type 2 diabetes mellitus without complications: Secondary | ICD-10-CM | POA: Diagnosis not present

## 2018-05-30 DIAGNOSIS — K219 Gastro-esophageal reflux disease without esophagitis: Secondary | ICD-10-CM | POA: Insufficient documentation

## 2018-05-30 DIAGNOSIS — R0602 Shortness of breath: Secondary | ICD-10-CM | POA: Insufficient documentation

## 2018-05-30 DIAGNOSIS — M797 Fibromyalgia: Secondary | ICD-10-CM | POA: Insufficient documentation

## 2018-05-30 DIAGNOSIS — Z79899 Other long term (current) drug therapy: Secondary | ICD-10-CM | POA: Insufficient documentation

## 2018-05-30 DIAGNOSIS — D696 Thrombocytopenia, unspecified: Secondary | ICD-10-CM | POA: Insufficient documentation

## 2018-05-30 DIAGNOSIS — Z87891 Personal history of nicotine dependence: Secondary | ICD-10-CM | POA: Insufficient documentation

## 2018-05-30 DIAGNOSIS — I10 Essential (primary) hypertension: Secondary | ICD-10-CM | POA: Diagnosis not present

## 2018-05-30 DIAGNOSIS — Z7982 Long term (current) use of aspirin: Secondary | ICD-10-CM | POA: Insufficient documentation

## 2018-05-30 DIAGNOSIS — E785 Hyperlipidemia, unspecified: Secondary | ICD-10-CM | POA: Insufficient documentation

## 2018-05-30 DIAGNOSIS — R700 Elevated erythrocyte sedimentation rate: Secondary | ICD-10-CM | POA: Diagnosis not present

## 2018-05-30 DIAGNOSIS — F411 Generalized anxiety disorder: Secondary | ICD-10-CM | POA: Insufficient documentation

## 2018-05-30 LAB — LACTATE DEHYDROGENASE: LDH: 172 U/L (ref 98–192)

## 2018-05-30 LAB — CBC WITH DIFFERENTIAL/PLATELET
ABS IMMATURE GRANULOCYTES: 0.01 10*3/uL (ref 0.00–0.07)
Basophils Absolute: 0 10*3/uL (ref 0.0–0.1)
Basophils Relative: 1 %
EOS ABS: 0.2 10*3/uL (ref 0.0–0.5)
EOS PCT: 4 %
HEMATOCRIT: 41 % (ref 36.0–46.0)
HEMOGLOBIN: 13.1 g/dL (ref 12.0–15.0)
Immature Granulocytes: 0 %
LYMPHS ABS: 1.4 10*3/uL (ref 0.7–4.0)
LYMPHS PCT: 39 %
MCH: 30.4 pg (ref 26.0–34.0)
MCHC: 32 g/dL (ref 30.0–36.0)
MCV: 95.1 fL (ref 80.0–100.0)
MONOS PCT: 9 %
Monocytes Absolute: 0.3 10*3/uL (ref 0.1–1.0)
NEUTROS PCT: 47 %
NRBC: 0 % (ref 0.0–0.2)
Neutro Abs: 1.7 10*3/uL (ref 1.7–7.7)
Platelets: 83 10*3/uL — ABNORMAL LOW (ref 150–400)
RBC: 4.31 MIL/uL (ref 3.87–5.11)
RDW: 13.2 % (ref 11.5–15.5)
SMEAR REVIEW: DECREASED
WBC: 3.6 10*3/uL — ABNORMAL LOW (ref 4.0–10.5)

## 2018-05-30 LAB — COMPREHENSIVE METABOLIC PANEL
ALBUMIN: 3.5 g/dL (ref 3.5–5.0)
ALK PHOS: 121 U/L (ref 38–126)
ALT: 28 U/L (ref 0–44)
AST: 44 U/L — ABNORMAL HIGH (ref 15–41)
Anion gap: 3 — ABNORMAL LOW (ref 5–15)
BILIRUBIN TOTAL: 1.8 mg/dL — AB (ref 0.3–1.2)
BUN: 18 mg/dL (ref 8–23)
CALCIUM: 8.7 mg/dL — AB (ref 8.9–10.3)
CHLORIDE: 112 mmol/L — AB (ref 98–111)
CO2: 28 mmol/L (ref 22–32)
CREATININE: 0.58 mg/dL (ref 0.44–1.00)
GFR calc non Af Amer: >60 mL/min (ref >60)
Glucose, Bld: 146 mg/dL — ABNORMAL HIGH (ref 70–99)
Potassium: 4 mmol/L (ref 3.5–5.1)
Sodium: 143 mmol/L (ref 135–145)
Total Protein: 6.3 g/dL — ABNORMAL LOW (ref 6.5–8.1)

## 2018-05-31 ENCOUNTER — Encounter: Payer: Self-pay | Admitting: Physical Therapy

## 2018-05-31 ENCOUNTER — Ambulatory Visit: Payer: PPO | Admitting: Physical Therapy

## 2018-05-31 DIAGNOSIS — M25661 Stiffness of right knee, not elsewhere classified: Secondary | ICD-10-CM

## 2018-05-31 DIAGNOSIS — M6281 Muscle weakness (generalized): Secondary | ICD-10-CM

## 2018-05-31 DIAGNOSIS — M25561 Pain in right knee: Secondary | ICD-10-CM

## 2018-05-31 NOTE — Therapy (Signed)
[facility] [place], [place], [address] Phone: [phone]   Fax:  [phone]  Physical Therapy Treatment  Patient Details  Name: Stacey Summers MRN: [id number] Date of Birth: 1946-06-18 Referring Provider (PT): Victorino December MD.   Encounter Date: 05/31/2018  PT End of Session - 05/31/18 1123    Visit Number  13    Number of Visits  24    Date for PT Re-Evaluation  06/21/18    Authorization Type  FOTO AT LEAST EVERY 5TH VISIT, 10TH VISIT PROGRESS NOTE AND KX MODIFIER AFTER THE 15 VISIT.    PT Start Time  1116    PT Stop Time  1156    PT Time Calculation (min)  40 min    Activity Tolerance  Patient tolerated treatment well;Patient limited by pain    Behavior During Therapy  [facility] for tasks assessed/performed       Past Medical History:  Diagnosis Date  . Allergy    seasonal  . Anxiety   . Arthritis    hands, back   . Asthma    bronchitis  . Cataract    removed both eyes  . Chronic back pain    lower back   . COPD (chronic obstructive pulmonary disease) ([facility])   . Diabetes mellitus without complication ([place])   . DJD (degenerative joint disease)   . Fibromyalgia   . Genetic testing 03/08/2017   Ms. Bitter underwent genetic counseling and testing for hereditary cancer syndromes on 02/21/2017. Her results were negative for mutations in all 83 genes analyzed by [facility]'s 83-gene Multi-Cancers Panel. Genes analyzed include: ALK, APC, ATM, AXIN2, BAP1, BARD1, BLM, BMPR1A, BRCA1, BRCA2, BRIP1, CASR, CDC73, CDH1, CDK4, CDKN1B, CDKN1C, CDKN2A, CEBPA, CHEK2, CTNNA1, DICER1, DIS3L2, EGFR, EPCAM  . GERD (gastroesophageal reflux disease)   . History of abnormal mammogram   . Hyperlipidemia   . Hypertension   . Irregular heart beat   . Neuromuscular disorder (H[facility])    fibromyalgia  . Sleep apnea    cpap some  . Tubular adenoma of colon 05/09/12   "innumerable number" of flat polyps    Past Surgical History:   Procedure Laterality Date  . CARPAL TUNNEL RELEASE Left 10/20/14   [facility]  . [place]  . CHOLECYSTECTOMY  1990  . COLONOSCOPY    . KNEE ARTHROSCOPY WITH SUBCHONDROPLASTY Right 03/27/2018   Procedure: Right knee arthroscopic partial medial meniscus repair versus menisectomy with medial tibia subchondroplasty;  Surgeon: Nicholes Stairs, MD;  Location: [facility];  Service: Orthopedics;  Laterality: Right;  120 mins  . LARYNX SURGERY     POLYP REMOVAL  . POLYPECTOMY     vocal cord  . TONSILLECTOMY  1965  . TUMOR REMOVAL     benign fatty tumors on abdomen    There were no vitals filed for this visit.  Subjective Assessment - 05/31/18 1120    Subjective  Patient reported doing better today, some ongoing discomfort in knee and back    Pertinent History  DJD; Osteopenia; chronic back pain; COPD; Fibromyalgia and irregular heart beat.    Limitations  Walking    How long can you walk comfortably?  Short distances in community.    Patient Stated Goals  Get back to normal.  "I have a lot to do."    Currently in Pain?  Yes    Pain Score  5     Pain Location  Knee    Pain Orientation  Right    Pain Descriptors / Indicators  Sore    Pain Type  Surgical pain    Pain Onset  More than a month ago    Pain Frequency  Intermittent    Aggravating Factors   any prolong walking and standing    Pain Relieving Factors  at rest                        [facility] Adult PT Treatment/Exercise - 05/31/18 0001      Exercises   Exercises  Knee/Hip      Knee/Hip Exercises: Aerobic   Nustep  L3 x10mn UE/LE activity      Knee/Hip Exercises: Seated   Long Arc Quad  Strengthening;Right;10 reps;Weights;3 sets    LIllinois Tool WorksWeight  3 lbs.    Ball Squeeze  x30    Other Seated Knee/Hip Exercises  clam with red t-band x30    Marching  Strengthening;Right;AROM;3 sets;10 reps    Hamstring Curl  Strengthening;Right;20 reps    Hamstring Limitations  red t-band      Electrical  Stimulation   Electrical Stimulation Location  Right knee.    Electrical Stimulation Action  premod    Electrical Stimulation Parameters  80-_0  x171m    Electrical Stimulation Goals  Edema;Pain      Vasopneumatic   Number Minutes Vasopneumatic   15 minutes    Vasopnuematic Location   Knee    Vasopneumatic Pressure  Low   sitting              PT Short Term Goals - 05/31/18 1140      PT SHORT TERM GOAL #1   Title  Independent with an initial HEP.    Time  2    Period  Weeks    Status  Achieved      PT SHORT TERM GOAL #2   Title  Full active right knee extension.    Time  2    Period  Weeks    Status  --   -5 degees 05/31/18       PT Long Term Goals - 05/17/18 1245      PT LONG TERM GOAL #1   Title  Independent with an advanced HEP.    Time  4    Period  Weeks    Status  On-going      PT LONG TERM GOAL #2   Title  Active right knee flexion to 115 degrees+ so the patient can perform functional tasks and do so with pain not > 2-3/10.    Baseline  MET 05/09/18    Time  4    Period  Weeks    Status  Achieved      PT LONG TERM GOAL #3   Title  Increase right knee strength to a solid 4+/5 to provide good stability for accomplishment of functional activities.    Baseline  Met 05/17/18    Time  4    Period  Weeks    Status  Achieved      PT LONG TERM GOAL #4   Title  Perform a reciprocating stair gait with one railing with pain not > 2-3/10.    Baseline  4/10 overall pain with stairs and has to go slow    Time  4    Period  Weeks    Status  On-going      PT LONG TERM GOAL #5   Title  Perform  ADL's with pain not > 2/10.    Baseline  4/10 pain overall    Time  4    Period  Weeks    Status  On-going            Plan - 05/31/18 1141    Clinical Impression Statement  Patient tolerated treatment fair today due to limitations with back. Today focused on seated exercises to provide comfort to patient back while strengthening right knee. Patient has  less discomfort in knee yet about 5/10 today. Patient has improved ext ROM yet goal ongoing at this time.     Rehab Potential  Good    PT Frequency  3x / week    PT Duration  4 weeks    PT Treatment/Interventions  ADLs/Self Care Home Management;Cryotherapy;Electrical Stimulation;Ultrasound;Moist Heat;Gait training;Stair training;Functional mobility training;Therapeutic activities;Therapeutic exercise;Neuromuscular re-education;Patient/family education;Passive range of motion;Manual techniques;Vasopneumatic Device    PT Next Visit Plan  cont with POC  to strength, ROM, activity tolerance and decrease pain.    Consulted and Agree with Plan of Care  Patient       Patient will benefit from skilled therapeutic intervention in order to improve the following deficits and impairments:  Abnormal gait, Pain, Decreased activity tolerance, Decreased range of motion, Decreased strength  Visit Diagnosis: Acute pain of right knee  Stiffness of right knee, not elsewhere classified  Muscle weakness (generalized)     Problem List Patient Active Problem List   Diagnosis Date Noted  . Complex tear of medial meniscus of right knee 03/27/2018  . Stress fracture of right tibia 03/27/2018  . Chronic pain of right knee 12/19/2017  . Genetic testing 03/08/2017  . Restrictive lung disease secondary to obesity 08/04/2015  . Physical deconditioning 08/04/2015  . OSA treated with BiPAP 08/04/2015  . Gastroesophageal reflux disease without esophagitis 12/25/2014  . Low serum vitamin D 05/28/2014  . Osteopenia 05/28/2014  . COPD (chronic obstructive pulmonary disease) ([place]) 01/29/2014  . Dyspnea on exertion 01/07/2014  . Fatigue 03/04/2013  . Murmur, cardiac 2013/03/04  . Diabetes ([place]) 2013/03/04  . Morbid obesity ([place]) 04-Mar-2013  . Family history of sudden death 03-04-2013  . Hyperlipidemia Mar 04, 2013  . Hypertension 12/20/2012  . GAD (generalized anxiety disorder) 12/20/2012  . Fibromyalgia muscle  pain 12/20/2012    Jaylah Goodlow P, PTA 05/31/2018, 11:58 AM  [facility] [place], [place], [address] Phone: [phone]   Fax:  [phone]  Name: TIFFNEY HAUGHTON MRN: [id number] Date of Birth: 10-24-1945

## 2018-06-05 ENCOUNTER — Encounter (HOSPITAL_COMMUNITY): Payer: Self-pay | Admitting: Internal Medicine

## 2018-06-05 ENCOUNTER — Other Ambulatory Visit: Payer: Self-pay

## 2018-06-05 ENCOUNTER — Inpatient Hospital Stay (HOSPITAL_BASED_OUTPATIENT_CLINIC_OR_DEPARTMENT_OTHER): Payer: PPO | Admitting: Internal Medicine

## 2018-06-05 ENCOUNTER — Encounter: Payer: PPO | Admitting: Physical Therapy

## 2018-06-05 VITALS — BP 134/64 | HR 66 | Temp 98.1°F | Resp 18 | Wt 223.0 lb

## 2018-06-05 DIAGNOSIS — G4733 Obstructive sleep apnea (adult) (pediatric): Secondary | ICD-10-CM

## 2018-06-05 DIAGNOSIS — Z87891 Personal history of nicotine dependence: Secondary | ICD-10-CM

## 2018-06-05 DIAGNOSIS — R700 Elevated erythrocyte sedimentation rate: Secondary | ICD-10-CM | POA: Diagnosis not present

## 2018-06-05 DIAGNOSIS — E785 Hyperlipidemia, unspecified: Secondary | ICD-10-CM | POA: Diagnosis not present

## 2018-06-05 DIAGNOSIS — M8580 Other specified disorders of bone density and structure, unspecified site: Secondary | ICD-10-CM | POA: Diagnosis not present

## 2018-06-05 DIAGNOSIS — Z79899 Other long term (current) drug therapy: Secondary | ICD-10-CM

## 2018-06-05 DIAGNOSIS — K219 Gastro-esophageal reflux disease without esophagitis: Secondary | ICD-10-CM | POA: Diagnosis not present

## 2018-06-05 DIAGNOSIS — E119 Type 2 diabetes mellitus without complications: Secondary | ICD-10-CM | POA: Diagnosis not present

## 2018-06-05 DIAGNOSIS — F411 Generalized anxiety disorder: Secondary | ICD-10-CM

## 2018-06-05 DIAGNOSIS — J449 Chronic obstructive pulmonary disease, unspecified: Secondary | ICD-10-CM | POA: Diagnosis not present

## 2018-06-05 DIAGNOSIS — Z8042 Family history of malignant neoplasm of prostate: Secondary | ICD-10-CM

## 2018-06-05 DIAGNOSIS — R948 Abnormal results of function studies of other organs and systems: Secondary | ICD-10-CM | POA: Diagnosis not present

## 2018-06-05 DIAGNOSIS — D696 Thrombocytopenia, unspecified: Secondary | ICD-10-CM

## 2018-06-05 DIAGNOSIS — Z800 Family history of malignant neoplasm of digestive organs: Secondary | ICD-10-CM

## 2018-06-05 DIAGNOSIS — Z7984 Long term (current) use of oral hypoglycemic drugs: Secondary | ICD-10-CM

## 2018-06-05 DIAGNOSIS — M797 Fibromyalgia: Secondary | ICD-10-CM

## 2018-06-05 DIAGNOSIS — Z7982 Long term (current) use of aspirin: Secondary | ICD-10-CM

## 2018-06-05 DIAGNOSIS — I10 Essential (primary) hypertension: Secondary | ICD-10-CM

## 2018-06-05 DIAGNOSIS — R0602 Shortness of breath: Secondary | ICD-10-CM

## 2018-06-05 DIAGNOSIS — R945 Abnormal results of liver function studies: Secondary | ICD-10-CM

## 2018-06-05 DIAGNOSIS — Z8051 Family history of malignant neoplasm of kidney: Secondary | ICD-10-CM

## 2018-06-05 NOTE — Progress Notes (Signed)
Diagnosis Thrombocytopenia (Stacey Summers) - Plan: CBC with Differential/Platelet, Comprehensive metabolic panel, Lactate dehydrogenase, DG Chest 2 View  Abnormal results of liver function studies - Plan: CBC with Differential/Platelet, Comprehensive metabolic panel, Lactate dehydrogenase, DG Chest 2 View, CT ABDOMEN PELVIS W CONTRAST  Shortness of breath - Plan: CBC with Differential/Platelet, Comprehensive metabolic panel, Lactate dehydrogenase, DG Chest 2 View  Staging Cancer Staging No matching staging information was found for the patient.  Assessment and Plan:   1.  Thrombocytopenia.  Labs done 05/30/2018 reviewed with pt and showed WBC 3.6 HB 13.1 plts 83,000.  Chemistries WNL with K+ 4, Cr 0.58 mildly elevated AST of 44, normal ALT of 28 and normal LDH of 172.  Plts are decreased from labs done  04/02/2018 when platelets were 98,000 she has a normal differential.  No Coagulation studies done 03/27/2018 WNL and showed a PT 14.5 PTT 38.  ANA negative.  Path review of smear negative.    I have discussed with pt that plt count had decreased further and is now 83,000.  Pt has option of CT abdomen and pelvis but if negative, she will be recommended for bone marrow biopsy for definitive diagnosis. Scans set up and will follow-up results.    2.  Joint pain.  She has negative SPEP, C-reactive protein, ANA, rheumatoid factor.  She has an elevated sed rate of 37.  She had recent right meniscus surgery done in August 2019.  Pt has fibromyalgia.  Follow-up with PCP.    3.  Elevated Bilirubin.  Pt labs show elevated bilirubin dating back to 2017.  Bilirubin 1.8 on labs done 05/30/2018.  Pt set up for CT of abdomen and pelvs for further evaluation of liver, gallbladder and spleen.  She will be notified of results.    4.  Family history of colon cancer.  She reports this occurred in her father.  Patient reports multiple polyps on colonoscopy evaluation and she has frequent exams due to this.  She had genetic  testing done that was negative.  Pt should follow-up with GI as recommended.    5.  Hypertension.  Blood pressure is 134/64.  Follow-up with PCP.  6.  SOB.  Pulse ox is 100% on room air.  Pt set up for CXR with scans for further evaluation.    7.  Health maintenance.  Mammogram done October 06/03/2017 that was negative.  Colonoscopy done 12/06/2016 showed several polyps that were negative for malignancy.  Patient has undergone genetic testing that was negative. Continue mammogram and GI follow-up as recommended.    25 minutes spent with more than 50% spent in counseling and coordination of care.    Interval history:  Historical data obtained from note dated 04/02/2018.  72 year old female referred for evaluation due to thrombocytopenia.  She had recent surgery on her right knee.  She denies any bleeding with the surgery.  She denied nonsteroidal use.  She reports a family history of colon cancer in her father..  She has frequent GI evaluations due to showing evidence of multiple polyps.  Review of chart shows labs done March 21, 2018 showed a white count of 3.8 hemoglobin 12.8 platelets 84,000 MCV was 101.  She had a normal differential.  Chemistries within normal limits other than an elevated bilirubin.  She reports she bruises easily.  She denies any chills, night sweats, and has noted no adenopathy.  She reported low-grade temperature after surgery.   Current Status:  Pt is seen today for follow-up.  She is  here to go over labs.    Problem List Patient Active Problem List   Diagnosis Date Noted  . Complex tear of medial meniscus of right knee [S83.231A] 03/27/2018  . Stress fracture of right tibia [T59.741U] 03/27/2018  . Chronic pain of right knee [M25.561, G89.29] 12/19/2017  . Genetic testing [Z13.79] 03/08/2017  . 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 Physical deconditioning [R53.81] 08/04/2015  . OSA treated with BiPAP [G47.33] 08/04/2015  .  Gastroesophageal reflux disease without esophagitis [K21.9] 12/25/2014  . Low serum vitamin D [R79.89] 05/28/2014  . Osteopenia [M85.80] 05/28/2014  . COPD (chronic obstructive pulmonary disease) ([place]) [J44.9] 01/29/2014  . Dyspnea on exertion [R06.09] 01/07/2014  . Fatigue [R53.83] Mar 13, 2013  . Murmur, cardiac [R01.1] 03/13/13  . Diabetes ([place]) [E11.9] March 13, 2013  . Morbid obesity ([place]) [E66.01] 03-13-13  . Family history of sudden death [Z84.89] 03-13-13  . Hyperlipidemia [E78.5] 03/13/13  . Hypertension [I10] 12/20/2012  . GAD (generalized anxiety disorder) [F41.1] 12/20/2012  . Fibromyalgia muscle pain [M79.7] 12/20/2012    Past Medical History Past Medical History:  Diagnosis Date  . Allergy    seasonal  . Anxiety   . Arthritis    hands, back   . Asthma    bronchitis  . Cataract    removed both eyes  . Chronic back pain    lower back   . COPD (chronic obstructive pulmonary disease) ([place])   . Diabetes mellitus without complication ([place])   . DJD (degenerative joint disease)   . Fibromyalgia   . Genetic testing 03/08/2017   Ms. Mitten underwent genetic counseling and testing for hereditary cancer syndromes on 02/21/2017. Her results were negative for mutations in all 83 genes analyzed by [facility]'s 83-gene Multi-Cancers Panel. Genes analyzed include: ALK, APC, ATM, AXIN2, BAP1, BARD1, BLM, BMPR1A, BRCA1, BRCA2, BRIP1, CASR, CDC73, CDH1, CDK4, CDKN1B, CDKN1C, CDKN2A, CEBPA, CHEK2, CTNNA1, DICER1, DIS3L2, EGFR, EPCAM  . GERD (gastroesophageal reflux disease)   . History of abnormal mammogram   . Hyperlipidemia   . Hypertension   . Irregular heart beat   . Neuromuscular disorder ([facility])    fibromyalgia  . Sleep apnea    cpap some  . Tubular adenoma of colon 05/09/12   "innumerable number" of flat polyps    Past Surgical History Past Surgical History:  Procedure Laterality Date  . CARPAL TUNNEL RELEASE Left 10/20/14   [facility]  . [place]  .  CHOLECYSTECTOMY  1990  . COLONOSCOPY    . KNEE ARTHROSCOPY WITH SUBCHONDROPLASTY Right 03/27/2018   Procedure: Right knee arthroscopic partial medial meniscus repair versus menisectomy with medial tibia subchondroplasty;  Surgeon: Nicholes Stairs, MD;  Location: [place];  Service: Orthopedics;  Laterality: Right;  120 mins  . LARYNX SURGERY     POLYP REMOVAL  . POLYPECTOMY     vocal cord  . TONSILLECTOMY  1965  . TUMOR REMOVAL     benign fatty tumors on abdomen    Family History Family History  Problem Relation Age of Onset  . Prostate cancer Father        d.89 diagnosed in late-60s  . Colon cancer Father        diagnosed at 50  . Heart failure Mother        d.37  . Heart disease Mother   . Colon polyps Brother        polyposis. Underwent partial colectomy at age 45.  Marland Kitchen Spina bifida Brother  died at birth  . Stomach cancer Paternal Aunt   . Brain cancer Cousin 19       paternal first-cousin-once-removed  . Kidney cancer Cousin        d.78s maternal first-cousin  . Other Other 16       multiple lipomas  . Colon cancer Paternal Uncle        in his 61s  . Heart failure Maternal Grandmother        d.45  . Heart failure Maternal Uncle        d.50  . Colon polyps Son 70  . Heart attack Son 35  . Esophageal cancer Neg Hx   . Rectal cancer Neg Hx      Social History  reports that she quit smoking about 48 years ago. Her smoking use included cigarettes. She has a 3.00 pack-year smoking history. She has never used smokeless tobacco. She reports that she does not drink alcohol or use drugs.  Medications  Current Outpatient Medications:  .  ALPRAZolam (XANAX) 0.25 MG tablet, Take 1 tablet (0.25 mg total) by mouth 2 (two) times daily as needed. (Patient taking differently: Take 0.25 mg by mouth 2 (two) times daily as needed for anxiety. ), Disp: 30 tablet, Rfl: 1 .  amLODipine (NORVASC) 5 MG tablet, Take 1 tablet (5 mg total) by mouth daily., Disp: 180 tablet, Rfl:  1 .  aspirin 81 MG tablet, Take 81 mg by mouth daily. , Disp: , Rfl:  .  atorvastatin (LIPITOR) 40 MG tablet, Take 1 tablet (40 mg total) by mouth daily., Disp: 90 tablet, Rfl: 1 .  benazepril (LOTENSIN) 40 MG tablet, Take 1 tablet (40 mg total) by mouth daily., Disp: 90 tablet, Rfl: 1 .  Blood Glucose Monitoring Suppl (LIBERTY BLOOD GLUCOSE MONITOR) DEVI, 1 each by Does not apply route daily. Test 1x per day and prn-dx250.02, Disp: 1 each, Rfl: 0 .  Carboxymethylcellul-Glycerin (LUBRICATING EYE DROPS OP), Place 1 drop into both eyes daily as needed (dry eyes)., Disp: , Rfl:  .  Cholecalciferol (VITAMIN D3) 5000 units CAPS, Take 5,000 Units by mouth daily., Disp: , Rfl:  .  cyclobenzaprine (FLEXERIL) 5 MG tablet, TAKE 1 TABLET BY MOUTH THREE TIMES DAILY AS NEEDED FOR MUSCLE SPASMS (NEEDS APPOINTMENT), Disp: 30 tablet, Rfl: 0 .  furosemide (LASIX) 40 MG tablet, TAKE 1 BY MOUTH EVERY MORNING AND 1/2 BY MOUTH AT 1 IN THE AFTERNOON (Patient taking differently: Take 20-40 mg by mouth See admin instructions. Take 40 mg in the morning and take 20 to 40 mg in the afternoon (based on swelling)), Disp: 135 tablet, Rfl: 1 .  glucose blood test strip, Test 1x per day and prn- dx 250.02, Disp: 100 each, Rfl: 2 .  HYDROcodone-acetaminophen (NORCO) 5-325 MG tablet, Norco 5 mg-325 mg tablet  Take 1 tablet every 6 hours by oral route., Disp: , Rfl:  .  meclizine (ANTIVERT) 25 MG tablet, Take 1 tablet (25 mg total) by mouth 3 (three) times daily as needed for dizziness., Disp: 30 tablet, Rfl: 0 .  metFORMIN (GLUCOPHAGE) 500 MG tablet, Take 1 tablet (500 mg total) by mouth 2 (two) times daily with a meal., Disp: 180 tablet, Rfl: 1 .  omeprazole (PRILOSEC) 40 MG capsule, Take 1 capsule (40 mg total) by mouth daily., Disp: 90 capsule, Rfl: 0 .  ondansetron (ZOFRAN ODT) 4 MG disintegrating tablet, Take 1 tablet (4 mg total) by mouth every 8 (eight) hours as needed., Disp: 20 tablet, Rfl: 0 .  ONE TOUCH ULTRA TEST test  strip, USE ONE STRIP TO CHECK GLUCOSE ONCE DAILY, Disp: 100 each, Rfl: 1 .  ONETOUCH DELICA LANCETS 06Y MISC, USE ONE LANCET TO CHECK GLUCOSE ONCE DAILY AND AS NEEDED, Disp: 100 each, Rfl: 0 .  oxyCODONE (ROXICODONE) 5 MG immediate release tablet, Take 1-2 tablets (5-10 mg total) by mouth every 4 (four) hours as needed for severe pain or breakthrough pain., Disp: 45 tablet, Rfl: 0 .  traMADol (ULTRAM) 50 MG tablet, Take 2 tablets (100 mg total) by mouth 3 (three) times daily. (Patient taking differently: Take 50 mg by mouth 3 (three) times daily as needed for moderate pain. ), Disp: 90 tablet, Rfl: 0 .  valACYclovir (VALTREX) 1000 MG tablet, valacyclovir 1 gram tablet, Disp: , Rfl:   Allergies Latex  Review of Systems Review of Systems - Oncology ROS negative other than joint pain and SOB.    Physical Exam  Vitals Wt Readings from Last 3 Encounters:  06/05/18 223 lb (101.2 kg)  04/20/18 222 lb 14.4 oz (101.1 kg)  04/02/18 222 lb (100.7 kg)   Temp Readings from Last 3 Encounters:  06/05/18 98.1 F (36.7 C) (Oral)  04/20/18 98.1 F (36.7 C) (Oral)  04/02/18 98 F (36.7 C) (Oral)   BP Readings from Last 3 Encounters:  06/05/18 134/64  04/20/18 (!) 154/70  04/02/18 (!) 123/59   Pulse Readings from Last 3 Encounters:  06/05/18 66  04/20/18 72  04/02/18 73   Constitutional: Well-developed, well-nourished, and in no distress.   HENT: Head: Normocephalic and atraumatic.  Mouth/Throat: No oropharyngeal exudate. Mucosa moist. Eyes: Pupils are equal, round, and reactive to light. Conjunctivae are normal. No scleral icterus.  Neck: Normal range of motion. Neck supple. No JVD present.  Cardiovascular: Normal rate, regular rhythm and normal heart sounds.  Exam reveals no gallop and no friction rub.   No murmur heard. Pulmonary/Chest: Effort normal and breath sounds normal. No respiratory distress. No wheezes.No rales.  Abdominal: Soft. Bowel sounds are normal. No distension. There  is no tenderness. There is no guarding.  Musculoskeletal: No edema or tenderness.  Lymphadenopathy: No cervical, axillary or supraclavicular adenopathy.  Neurological: Alert and oriented to person, place, and time. No cranial nerve deficit.  Skin: Skin is warm and dry. No rash noted. No erythema. No pallor.  Psychiatric: Affect and judgment normal.   Labs No visits with results within 3 Day(s) from this visit.  Latest known visit with results is:  Appointment on 05/30/2018  Component Date Value Ref Range Status  . WBC 05/30/2018 3.6* 4.0 - 10.5 K/uL Final  . RBC 05/30/2018 4.31  3.87 - 5.11 MIL/uL Final  . Hemoglobin 05/30/2018 13.1  12.0 - 15.0 g/dL Final  . HCT 05/30/2018 41.0  36.0 - 46.0 % Final  . MCV 05/30/2018 95.1  80.0 - 100.0 fL Final  . MCH 05/30/2018 30.4  26.0 - 34.0 pg Final  . MCHC 05/30/2018 32.0  30.0 - 36.0 g/dL Final  . RDW 05/30/2018 13.2  11.5 - 15.5 % Final  . Platelets 05/30/2018 83* 150 - 400 K/uL Final   Comment: SPECIMEN CHECKED FOR CLOTS Immature Platelet Fraction may be clinically indicated, consider ordering this additional test [id number] CONSISTENT WITH PREVIOUS RESULT   . nRBC 05/30/2018 0.0  0.0 - 0.2 % Final  . Neutrophils Relative % 05/30/2018 47  % Final  . Neutro Abs 05/30/2018 1.7  1.7 - 7.7 K/uL Final  . Lymphocytes Relative 05/30/2018 39  % Final  .  Lymphs Abs 05/30/2018 1.4  0.7 - 4.0 K/uL Final  . Monocytes Relative 05/30/2018 9  % Final  . Monocytes Absolute 05/30/2018 0.3  0.1 - 1.0 K/uL Final  . Eosinophils Relative 05/30/2018 4  % Final  . Eosinophils Absolute 05/30/2018 0.2  0.0 - 0.5 K/uL Final  . Basophils Relative 05/30/2018 1  % Final  . Basophils Absolute 05/30/2018 0.0  0.0 - 0.1 K/uL Final  . Smear Review 05/30/2018 PLATELETS APPEAR DECREASED   Final   PLATELET COUNT CONFIRMED BY SMEAR  . Immature Granulocytes 05/30/2018 0  % Final  . Abs Immature Granulocytes 05/30/2018 0.01  0.00 - 0.07 K/uL Final   Performed at [facility], [address] [place], [place] [address]  . Sodium 05/30/2018 143  135 - 145 mmol/L Final  . Potassium 05/30/2018 4.0  3.5 - 5.1 mmol/L Final  . Chloride 05/30/2018 112* 98 - 111 mmol/L Final  . CO2 05/30/2018 28  22 - 32 mmol/L Final  . Glucose, Bld 05/30/2018 146* 70 - 99 mg/dL Final  . BUN 05/30/2018 18  8 - 23 mg/dL Final  . Creatinine, Ser 05/30/2018 0.58  0.44 - 1.00 mg/dL Final  . Calcium 05/30/2018 8.7* 8.9 - 10.3 mg/dL Final  . Total Protein 05/30/2018 6.3* 6.5 - 8.1 g/dL Final  . Albumin 05/30/2018 3.5  3.5 - 5.0 g/dL Final  . AST 05/30/2018 44* 15 - 41 U/L Final  . ALT 05/30/2018 28  0 - 44 U/L Final  . Alkaline Phosphatase 05/30/2018 121  38 - 126 U/L Final  . Total Bilirubin 05/30/2018 1.8* 0.3 - 1.2 mg/dL Final  . GFR calc non Af Amer 05/30/2018 >60  >60 mL/min Final  . GFR calc Af Amer 05/30/2018 >60  >60 mL/min Final   Comment: (NOTE) The eGFR has been calculated using the CKD EPI equation. This calculation has not been validated in all clinical situations. eGFR's persistently <60 mL/min signify possible Chronic Kidney Disease.   Georgiann Hahn gap 05/30/2018 3* 5 - 15 Final   Performed at [facility], [address] [place], [place] [address]  . LDH 05/30/2018 172  98 - 192 U/L Final   Performed at [facility], [address], [place], [place] [address]     Pathology Orders Placed This Encounter  Procedures  . DG Chest 2 View    Standing Status:   Future    Standing Expiration Date:   06/05/2019    Order Specific Question:   Reason for Exam (SYMPTOM  OR DIAGNOSIS REQUIRED)    Answer:   Shortness of breath    Order Specific Question:   Preferred imaging location?    Answer:   [facility]    Order Specific Question:   Radiology Contrast Protocol - do NOT remove file path    Answer:   [email or web address]  . CT ABDOMEN PELVIS W CONTRAST    Standing Status:   Future    Standing Expiration Date:   06/05/2019     Order Specific Question:   ** REASON FOR EXAM (FREE TEXT)    Answer:   thrombocytopenia    Order Specific Question:   If indicated for the ordered procedure, I authorize the administration of contrast media per Radiology protocol    Answer:   Yes    Order Specific Question:   Preferred imaging location?    Answer:   [facility]    Order Specific Question:   Is Oral Contrast requested for this  exam?    Answer:   Yes, Per Radiology protocol    Order Specific Question:   Radiology Contrast Protocol - do NOT remove file path    Answer:   [email or web address]  . CBC with Differential/Platelet    Standing Status:   Future    Standing Expiration Date:   06/06/2019  . Comprehensive metabolic panel    Standing Status:   Future    Standing Expiration Date:   06/06/2019  . Lactate dehydrogenase    Standing Status:   Future    Standing Expiration Date:   06/06/2019       Zoila Shutter MD

## 2018-06-06 ENCOUNTER — Encounter: Payer: Self-pay | Admitting: Physical Therapy

## 2018-06-06 ENCOUNTER — Ambulatory Visit: Payer: PPO | Admitting: Physical Therapy

## 2018-06-06 DIAGNOSIS — M6281 Muscle weakness (generalized): Secondary | ICD-10-CM

## 2018-06-06 DIAGNOSIS — M25561 Pain in right knee: Secondary | ICD-10-CM | POA: Diagnosis not present

## 2018-06-06 DIAGNOSIS — M25661 Stiffness of right knee, not elsewhere classified: Secondary | ICD-10-CM

## 2018-06-06 NOTE — Therapy (Signed)
[facility] [place], [place], [address] Phone: [phone]   Fax:  [phone]  Physical Therapy Treatment  Patient Details  Name: Stacey Summers MRN: [id number] Date of Birth: 1946-05-23 Referring Provider (PT): Victorino December MD.   Encounter Date: 06/06/2018  PT End of Session - 06/06/18 1443    Visit Number  14    Number of Visits  24    Date for PT Re-Evaluation  06/21/18    Authorization Type  FOTO AT LEAST EVERY 5TH VISIT, 10TH VISIT PROGRESS NOTE AND KX MODIFIER AFTER THE 15 VISIT.    PT Start Time  1434    PT Stop Time  1514    PT Time Calculation (min)  40 min    Activity Tolerance  Patient tolerated treatment well;Patient limited by pain    Behavior During Therapy  [facility] for tasks assessed/performed       Past Medical History:  Diagnosis Date  . Allergy    seasonal  . Anxiety   . Arthritis    hands, back   . Asthma    bronchitis  . Cataract    removed both eyes  . Chronic back pain    lower back   . COPD (chronic obstructive pulmonary disease) ([place])   . Diabetes mellitus without complication ([place])   . DJD (degenerative joint disease)   . Fibromyalgia   . Genetic testing 03/08/2017   Ms. Goulette underwent genetic counseling and testing for hereditary cancer syndromes on 02/21/2017. Her results were negative for mutations in all 83 genes analyzed by [facility]'s 83-gene Multi-Cancers Panel. Genes analyzed include: ALK, APC, ATM, AXIN2, BAP1, BARD1, BLM, BMPR1A, BRCA1, BRCA2, BRIP1, CASR, CDC73, CDH1, CDK4, CDKN1B, CDKN1C, CDKN2A, CEBPA, CHEK2, CTNNA1, DICER1, DIS3L2, EGFR, EPCAM  . GERD (gastroesophageal reflux disease)   . History of abnormal mammogram   . Hyperlipidemia   . Hypertension   . Irregular heart beat   . Neuromuscular disorder (HCC)    fibromyalgia  . Sleep apnea    cpap some  . Tubular adenoma of colon 05/09/12   "innumerable number" of flat polyps    Past Surgical History:   Procedure Laterality Date  . CARPAL TUNNEL RELEASE Left 10/20/14   [facility]  . [place]  . CHOLECYSTECTOMY  1990  . COLONOSCOPY    . KNEE ARTHROSCOPY WITH SUBCHONDROPLASTY Right 03/27/2018   Procedure: Right knee arthroscopic partial medial meniscus repair versus menisectomy with medial tibia subchondroplasty;  Surgeon: Nicholes Stairs, MD;  Location: [place];  Service: Orthopedics;  Laterality: Right;  120 mins  . LARYNX SURGERY     POLYP REMOVAL  . POLYPECTOMY     vocal cord  . TONSILLECTOMY  1965  . TUMOR REMOVAL     benign fatty tumors on abdomen    There were no vitals filed for this visit.  Subjective Assessment - 06/06/18 1436    Subjective  Patient arrived and reported doing well, some ongoing back pain also    Pertinent History  DJD; Osteopenia; chronic back pain; COPD; Fibromyalgia and irregular heart beat.    Limitations  Walking    How long can you walk comfortably?  Short distances in community.    Patient Stated Goals  Get back to normal.  "I have a lot to do."    Currently in Pain?  Yes    Pain Score  3     Pain Location  Knee    Pain Orientation  Right;Medial  Pain Descriptors / Indicators  Aching;Sore    Pain Type  Surgical pain    Pain Onset  More than a month ago    Pain Frequency  Constant    Aggravating Factors   prolong walking and standing    Pain Relieving Factors  at rest                       [facility] Adult PT Treatment/Exercise - 06/06/18 0001      Knee/Hip Exercises: Aerobic   Nustep  L3 x33mn UE/LE activity      Knee/Hip Exercises: Seated   Long Arc Quad  Strengthening;Right;10 reps;Weights;3 sets    LIllinois Tool WorksWeight  3 lbs.    Ball Squeeze  x30    Other Seated Knee/Hip Exercises  clam with red t-band x30    Marching  Strengthening;Right;AROM;3 sets;10 reps    Hamstring Curl  Strengthening;Right;20 reps    Hamstring Limitations  red t-band      Electrical Stimulation   Electrical Stimulation  Location  Right knee.    Electrical Stimulation Action  premod    Electrical Stimulation Parameters  80-_0  x141m    Electrical Stimulation Goals  Edema;Pain      Vasopneumatic   Number Minutes Vasopneumatic   15 minutes    Vasopnuematic Location   Knee    Vasopneumatic Pressure  Low               PT Short Term Goals - 05/31/18 1140      PT SHORT TERM GOAL #1   Title  Independent with an initial HEP.    Time  2    Period  Weeks    Status  Achieved      PT SHORT TERM GOAL #2   Title  Full active right knee extension.    Time  2    Period  Weeks    Status  --   -5 degees 05/31/18       PT Long Term Goals - 05/17/18 1245      PT LONG TERM GOAL #1   Title  Independent with an advanced HEP.    Time  4    Period  Weeks    Status  On-going      PT LONG TERM GOAL #2   Title  Active right knee flexion to 115 degrees+ so the patient can perform functional tasks and do so with pain not > 2-3/10.    Baseline  MET 05/09/18    Time  4    Period  Weeks    Status  Achieved      PT LONG TERM GOAL #3   Title  Increase right knee strength to a solid 4+/5 to provide good stability for accomplishment of functional activities.    Baseline  Met 05/17/18    Time  4    Period  Weeks    Status  Achieved      PT LONG TERM GOAL #4   Title  Perform a reciprocating stair gait with one railing with pain not > 2-3/10.    Baseline  4/10 overall pain with stairs and has to go slow    Time  4    Period  Weeks    Status  On-going      PT LONG TERM GOAL #5   Title  Perform ADL's with pain not > 2/10.    Baseline  4/10 pain overall    Time  4    Period  Weeks    Status  On-going            Plan - 06/06/18 1447    Clinical Impression Statement  Patient tolerated treatment well today. Patient is limited with some exercises due to back pain. Today focused on seated exercises to provide comfort to patients back while strengthening right knee with pain free exercises. Patient  feels less discomfort in knee overall. Goals ongoing at this time.     PT Frequency  3x / week    PT Duration  4 weeks    PT Treatment/Interventions  ADLs/Self Care Home Management;Cryotherapy;Electrical Stimulation;Ultrasound;Moist Heat;Gait training;Stair training;Functional mobility training;Therapeutic activities;Therapeutic exercise;Neuromuscular re-education;Patient/family education;Passive range of motion;Manual techniques;Vasopneumatic Device    PT Next Visit Plan  cont with POC  to strength, ROM, activity tolerance and decrease pain. progress standing exercises when appropriate    Consulted and Agree with Plan of Care  Patient       Patient will benefit from skilled therapeutic intervention in order to improve the following deficits and impairments:  Abnormal gait, Pain, Decreased activity tolerance, Decreased range of motion, Decreased strength  Visit Diagnosis: Acute pain of right knee  Stiffness of right knee, not elsewhere classified  Muscle weakness (generalized)     Problem List Patient Active Problem List   Diagnosis Date Noted  . Complex tear of medial meniscus of right knee 03/27/2018  . Stress fracture of right tibia 03/27/2018  . Chronic pain of right knee 12/19/2017  . Genetic testing 03/08/2017  . Restrictive lung disease secondary to obesity 08/04/2015  . Physical deconditioning 08/04/2015  . OSA treated with BiPAP 08/04/2015  . Gastroesophageal reflux disease without esophagitis 12/25/2014  . Low serum vitamin D 05/28/2014  . Osteopenia 05/28/2014  . COPD (chronic obstructive pulmonary disease) ([place]) 01/29/2014  . Dyspnea on exertion 01/07/2014  . Fatigue 03/20/13  . Murmur, cardiac 20-Mar-2013  . Diabetes ([place]) 20-Mar-2013  . Morbid obesity ([place]) March 20, 2013  . Family history of sudden death March 20, 2013  . Hyperlipidemia 2013/03/20  . Hypertension 12/20/2012  . GAD (generalized anxiety disorder) 12/20/2012  . Fibromyalgia muscle pain 12/20/2012     Carnita Golob P, PTA 06/06/2018, 3:14 PM  [facility] [address] [place], [place], [address] Phone: [phone]   Fax:  [phone]  Name: Stacey Summers MRN: [id number] Date of Birth: 1946-01-13

## 2018-06-07 ENCOUNTER — Encounter: Payer: PPO | Admitting: Physical Therapy

## 2018-06-12 ENCOUNTER — Ambulatory Visit: Payer: PPO | Admitting: Physical Therapy

## 2018-06-12 ENCOUNTER — Encounter: Payer: Self-pay | Admitting: Physical Therapy

## 2018-06-12 DIAGNOSIS — M25561 Pain in right knee: Secondary | ICD-10-CM | POA: Diagnosis not present

## 2018-06-12 DIAGNOSIS — M6281 Muscle weakness (generalized): Secondary | ICD-10-CM

## 2018-06-12 DIAGNOSIS — M25661 Stiffness of right knee, not elsewhere classified: Secondary | ICD-10-CM

## 2018-06-12 NOTE — Therapy (Signed)
[facility] [place], [place], [address] Phone: [phone]   Fax:  [phone]  Physical Therapy Treatment  Patient Details  Name: Stacey Summers MRN: [id number] Date of Birth: 05-07-1946 Referring Provider (PT): Victorino December MD.   Encounter Date: 06/12/2018  PT End of Session - 06/12/18 1147    Visit Number  15    Number of Visits  24    Date for PT Re-Evaluation  06/21/18    Authorization Type  FOTO AT LEAST EVERY 5TH VISIT, 10TH VISIT PROGRESS NOTE AND KX MODIFIER AFTER THE 15 VISIT.    PT Start Time  1116    PT Stop Time  1208    PT Time Calculation (min)  52 min    Activity Tolerance  Patient tolerated treatment well;Patient limited by pain    Behavior During Therapy  [facility] for tasks assessed/performed       Past Medical History:  Diagnosis Date  . Allergy    seasonal  . Anxiety   . Arthritis    hands, back   . Asthma    bronchitis  . Cataract    removed both eyes  . Chronic back pain    lower back   . COPD (chronic obstructive pulmonary disease) ([place])   . Diabetes mellitus without complication ([place])   . DJD (degenerative joint disease)   . Fibromyalgia   . Genetic testing 03/08/2017   Ms. Muston underwent genetic counseling and testing for hereditary cancer syndromes on 02/21/2017. Her results were negative for mutations in all 83 genes analyzed by [facility]'s 83-gene Multi-Cancers Panel. Genes analyzed include: ALK, APC, ATM, AXIN2, BAP1, BARD1, BLM, BMPR1A, BRCA1, BRCA2, BRIP1, CASR, CDC73, CDH1, CDK4, CDKN1B, CDKN1C, CDKN2A, CEBPA, CHEK2, CTNNA1, DICER1, DIS3L2, EGFR, EPCAM  . GERD (gastroesophageal reflux disease)   . History of abnormal mammogram   . Hyperlipidemia   . Hypertension   . Irregular heart beat   . Neuromuscular disorder (HCC)    fibromyalgia  . Sleep apnea    cpap some  . Tubular adenoma of colon 05/09/12   "innumerable number" of flat polyps    Past Surgical History:   Procedure Laterality Date  . CARPAL TUNNEL RELEASE Left 10/20/14   [facility]  . [place]  . CHOLECYSTECTOMY  1990  . COLONOSCOPY    . KNEE ARTHROSCOPY WITH SUBCHONDROPLASTY Right 03/27/2018   Procedure: Right knee arthroscopic partial medial meniscus repair versus menisectomy with medial tibia subchondroplasty;  Surgeon: Nicholes Stairs, MD;  Location: [place];  Service: Orthopedics;  Laterality: Right;  120 mins  . LARYNX SURGERY     POLYP REMOVAL  . POLYPECTOMY     vocal cord  . TONSILLECTOMY  1965  . TUMOR REMOVAL     benign fatty tumors on abdomen    There were no vitals filed for this visit.  Subjective Assessment - 06/12/18 1126    Subjective  Reports that with cold weather has made her knee sore a little bit.    Pertinent History  DJD; Osteopenia; chronic back pain; COPD; Fibromyalgia and irregular heart beat.    Limitations  Walking    How long can you walk comfortably?  Short distances in community.    Patient Stated Goals  Get back to normal.  "I have a lot to do."    Currently in Pain?  Yes    Pain Location  Knee    Pain Orientation  Right;Medial    Pain Descriptors /  Indicators  Sore;Discomfort    Pain Type  Surgical pain    Pain Onset  More than a month ago         [facility] PT Assessment - 06/12/18 0001      Assessment   Medical Diagnosis  Right knee scope/medial sub chondroplasty.    Onset Date/Surgical Date  03/27/18    Next MD Visit  06/2018                   [facility] Adult PT Treatment/Exercise - 06/12/18 0001      Exercises   Exercises  Knee/Hip      Knee/Hip Exercises: Aerobic   Nustep  L5 x15 min      Knee/Hip Exercises: Standing   Forward Lunges  Right;20 reps    Terminal Knee Extension  Other (comment)   Attempted but patient unable to tolerate medial knee contact   Forward Step Up  Right;15 reps;Hand Hold: 2;Step Height: 6"      Knee/Hip Exercises: Seated   Long Arc Quad  Strengthening;Right;10 reps;Weights;3 sets     [employer] Weight  3 lbs.      Knee/Hip Exercises: Supine   Heel Slides  AROM;Right;20 reps    Hip Adduction Isometric  Strengthening;Both;20 reps    Straight Leg Raises  AROM;Right;10 reps      Modalities   Modalities  Vasopneumatic      Vasopneumatic   Number Minutes Vasopneumatic   15 minutes    Vasopnuematic Location   Knee    Vasopneumatic Pressure  Low    Vasopneumatic Temperature   34               PT Short Term Goals - 05/31/18 1140      PT SHORT TERM GOAL #1   Title  Independent with an initial HEP.    Time  2    Period  Weeks    Status  Achieved      PT SHORT TERM GOAL #2   Title  Full active right knee extension.    Time  2    Period  Weeks    Status  --   -5 degees 05/31/18       PT Long Term Goals - 05/17/18 1245      PT LONG TERM GOAL #1   Title  Independent with an advanced HEP.    Time  4    Period  Weeks    Status  On-going      PT LONG TERM GOAL #2   Title  Active right knee flexion to 115 degrees+ so the patient can perform functional tasks and do so with pain not > 2-3/10.    Baseline  MET 05/09/18    Time  4    Period  Weeks    Status  Achieved      PT LONG TERM GOAL #3   Title  Increase right knee strength to a solid 4+/5 to provide good stability for accomplishment of functional activities.    Baseline  Met 05/17/18    Time  4    Period  Weeks    Status  Achieved      PT LONG TERM GOAL #4   Title  Perform a reciprocating stair gait with one railing with pain not > 2-3/10.    Baseline  4/10 overall pain with stairs and has to go slow    Time  4    Period  Weeks  Status  On-going      PT LONG TERM GOAL #5   Title  Perform ADL's with pain not > 2/10.    Baseline  4/10 pain overall    Time  4    Period  Weeks    Status  On-going            Plan - 06/12/18 1204    Clinical Impression Statement  Patient tolerated today's treatment fairly well as she was able to tolerate minimally more advanced exercises.  Patient progressed to forward step ups without complaint of pain. Patient unable to tolerate TKE due to the contact to medial knee. Patient able to tolerate SLR somewhat better today before complaint of proximal hip discomfort. Normal vasopneumatic response noted following removal of the modality.     Rehab Potential  Good    PT Frequency  3x / week    PT Duration  4 weeks    PT Treatment/Interventions  ADLs/Self Care Home Management;Cryotherapy;Electrical Stimulation;Ultrasound;Moist Heat;Gait training;Stair training;Functional mobility training;Therapeutic activities;Therapeutic exercise;Neuromuscular re-education;Patient/family education;Passive range of motion;Manual techniques;Vasopneumatic Device    PT Next Visit Plan  cont with POC  to strength, ROM, activity tolerance and decrease pain. progress standing exercises when appropriate    Consulted and Agree with Plan of Care  Patient       Patient will benefit from skilled therapeutic intervention in order to improve the following deficits and impairments:  Abnormal gait, Pain, Decreased activity tolerance, Decreased range of motion, Decreased strength  Visit Diagnosis: Acute pain of right knee  Stiffness of right knee, not elsewhere classified  Muscle weakness (generalized)     Problem List Patient Active Problem List   Diagnosis Date Noted  . Complex tear of medial meniscus of right knee 03/27/2018  . Stress fracture of right tibia 03/27/2018  . Chronic pain of right knee 12/19/2017  . Genetic testing 03/08/2017  . Restrictive lung disease secondary to obesity 08/04/2015  . Physical deconditioning 08/04/2015  . OSA treated with BiPAP 08/04/2015  . Gastroesophageal reflux disease without esophagitis 12/25/2014  . Low serum vitamin D 05/28/2014  . Osteopenia 05/28/2014  . COPD (chronic obstructive pulmonary disease) ([place]) 01/29/2014  . Dyspnea on exertion 01/07/2014  . Fatigue 03-13-2013  . Murmur, cardiac 03-13-2013  .  Diabetes ([place]) 2013-03-13  . Morbid obesity ([place]) 03-13-2013  . Family history of sudden death Mar 13, 2013  . Hyperlipidemia 03-13-2013  . Hypertension 12/20/2012  . GAD (generalized anxiety disorder) 12/20/2012  . Fibromyalgia muscle pain 12/20/2012    Standley Brooking, PTA 06/12/2018, 12:13 PM  [facility] [address] [place], [place], [address] Phone: [phone]   Fax:  [phone]  Name: Stacey Summers MRN: [id number] Date of Birth: Feb 20, 1946

## 2018-06-14 ENCOUNTER — Encounter: Payer: PPO | Admitting: Physical Therapy

## 2018-06-19 ENCOUNTER — Ambulatory Visit: Payer: PPO | Attending: Orthopedic Surgery | Admitting: Physical Therapy

## 2018-06-19 ENCOUNTER — Encounter: Payer: Self-pay | Admitting: Physical Therapy

## 2018-06-19 DIAGNOSIS — M25661 Stiffness of right knee, not elsewhere classified: Secondary | ICD-10-CM | POA: Diagnosis not present

## 2018-06-19 DIAGNOSIS — M6281 Muscle weakness (generalized): Secondary | ICD-10-CM | POA: Diagnosis not present

## 2018-06-19 DIAGNOSIS — M25561 Pain in right knee: Secondary | ICD-10-CM | POA: Diagnosis not present

## 2018-06-19 NOTE — Therapy (Signed)
[facility] [place], [place], [address] Phone: [phone]   Fax:  [phone]  Physical Therapy Treatment  Patient Details  Name: Stacey Summers MRN: [id number] Date of Birth: [birth year] Referring Provider (PT): Victorino December MD.   Encounter Date: 06/19/2018  PT End of Session - 06/19/18 1119    Visit Number  16    Number of Visits  24    Date for PT Re-Evaluation  06/21/18    Authorization Type  FOTO AT LEAST EVERY 5TH VISIT, 10TH VISIT PROGRESS NOTE AND KX MODIFIER AFTER THE 15 VISIT.    PT Start Time  1116    PT Stop Time  1208    PT Time Calculation (min)  52 min    Activity Tolerance  Patient tolerated treatment well    Behavior During Therapy  [facility] for tasks assessed/performed       Past Medical History:  Diagnosis Date  . Allergy    seasonal  . Anxiety   . Arthritis    hands, back   . Asthma    bronchitis  . Cataract    removed both eyes  . Chronic back pain    lower back   . COPD (chronic obstructive pulmonary disease) ([place])   . Diabetes mellitus without complication ([place])   . DJD (degenerative joint disease)   . Fibromyalgia   . Genetic testing 03/08/2017   [name] underwent genetic counseling and testing for hereditary cancer syndromes on 02/21/2017. Her results were negative for mutations in all 83 genes analyzed by [facility]'s 83-gene Multi-Cancers Panel. Genes analyzed include: ALK, APC, ATM, AXIN2, BAP1, BARD1, BLM, BMPR1A, BRCA1, BRCA2, BRIP1, CASR, CDC73, CDH1, CDK4, CDKN1B, CDKN1C, CDKN2A, CEBPA, CHEK2, CTNNA1, DICER1, DIS3L2, EGFR, EPCAM  . GERD (gastroesophageal reflux disease)   . History of abnormal mammogram   . Hyperlipidemia   . Hypertension   . Irregular heart beat   . Neuromuscular disorder (HCC)    fibromyalgia  . Sleep apnea    cpap some  . Tubular adenoma of colon 05/09/12   "innumerable number" of flat polyps    Past Surgical History:  Procedure Laterality Date  .  CARPAL TUNNEL RELEASE Left 10/20/14   [facility]  . [place]  . CHOLECYSTECTOMY  1990  . COLONOSCOPY    . KNEE ARTHROSCOPY WITH SUBCHONDROPLASTY Right 03/27/2018   Procedure: Right knee arthroscopic partial medial meniscus repair versus menisectomy with medial tibia subchondroplasty;  Surgeon: Nicholes Stairs, MD;  Location: [facility];  Service: Orthopedics;  Laterality: Right;  120 mins  . LARYNX SURGERY     POLYP REMOVAL  . POLYPECTOMY     vocal cord  . TONSILLECTOMY  1965  . TUMOR REMOVAL     benign fatty tumors on abdomen    There were no vitals filed for this visit.  Subjective Assessment - 06/19/18 1118    Subjective  Reports that she is glad it is not raining today.    Pertinent History  DJD; Osteopenia; chronic back pain; COPD; Fibromyalgia and irregular heart beat.    Limitations  Walking    How long can you walk comfortably?  Short distances in community.    Patient Stated Goals  Get back to normal.  "I have a lot to do."    Currently in Pain?  No/denies         [facility] PT Assessment - 06/19/18 0001      Assessment   Medical Diagnosis  Right  knee scope/medial sub chondroplasty.    Onset Date/Surgical Date  03/27/18    Next MD Visit  06/2018                   [facility] Adult PT Treatment/Exercise - 06/19/18 0001      Exercises   Exercises  Knee/Hip      Knee/Hip Exercises: Standing   Hip Flexion  AROM;Right;20 reps;Knee straight    Hip Abduction  AROM;Right;20 reps;Knee straight    Forward Step Up  Right;20 reps;Hand Hold: 2;Step Height: 4"      Knee/Hip Exercises: Seated   Long Arc Quad  Strengthening;Right;3 sets;10 reps;Weights;Limitations    Long Arc Quad Weight  3 lbs.    Long [employer] Limitations  with ball squeeze      Knee/Hip Exercises: Supine   Straight Leg Raises  AROM;Right;10 reps    Other Supine Knee/Hip Exercises  clam green theraband x20 reps      Modalities   Modalities  Vasopneumatic      Vasopneumatic   Number  Minutes Vasopneumatic   15 minutes    Vasopnuematic Location   Knee    Vasopneumatic Pressure  Low    Vasopneumatic Temperature   34               PT Short Term Goals - 05/31/18 1140      PT SHORT TERM GOAL #1   Title  Independent with an initial HEP.    Time  2    Period  Weeks    Status  Achieved      PT SHORT TERM GOAL #2   Title  Full active right knee extension.    Time  2    Period  Weeks    Status  --   -5 degees 05/31/18       PT Long Term Goals - 06/19/18 1147      PT LONG TERM GOAL #1   Title  Independent with an advanced HEP.    Time  4    Period  Weeks    Status  On-going      PT LONG TERM GOAL #2   Title  Active right knee flexion to 115 degrees+ so the patient can perform functional tasks and do so with pain not > 2-3/10.    Baseline  MET 05/09/18    Time  4    Period  Weeks    Status  Achieved      PT LONG TERM GOAL #3   Title  Increase right knee strength to a solid 4+/5 to provide good stability for accomplishment of functional activities.    Baseline  Met 05/17/18    Time  4    Period  Weeks    Status  Achieved      PT LONG TERM GOAL #4   Title  Perform a reciprocating stair gait with one railing with pain not > 2-3/10.    Baseline  4/10 overall pain with stairs and has to go slow    Time  4    Period  Weeks    Status  On-going      PT LONG TERM GOAL #5   Title  Perform ADL's with pain not > 2/10.    Baseline  4/10 pain overall    Time  4    Period  Weeks    Status  Achieved            Plan - 06/19/18 1157  Clinical Impression Statement  Patient tolerated today's treatment fairly well today although reporting LBP intermittantly during treatment. No complaints of any R knee discomfort during treatment today and reduced pain reported during ADLs at home. Patient reports experiencing intermittant discomfort in R knee which goes away after a period of time. Patient reported R anterior hip pain and groin discomfort with SLR.  Normal vasopneumatic response noted following removal of the modality. Patient continues utilizing SPC at this secondary to back and knee pain.    Rehab Potential  Good    PT Frequency  3x / week    PT Duration  4 weeks    PT Treatment/Interventions  ADLs/Self Care Home Management;Cryotherapy;Electrical Stimulation;Ultrasound;Moist Heat;Gait training;Stair training;Functional mobility training;Therapeutic activities;Therapeutic exercise;Neuromuscular re-education;Patient/family education;Passive range of motion;Manual techniques;Vasopneumatic Device    PT Next Visit Plan  cont with POC  to strength, ROM, activity tolerance and decrease pain. progress standing exercises when appropriate    Consulted and Agree with Plan of Care  Patient       Patient will benefit from skilled therapeutic intervention in order to improve the following deficits and impairments:  Abnormal gait, Pain, Decreased activity tolerance, Decreased range of motion, Decreased strength  Visit Diagnosis: Acute pain of right knee  Stiffness of right knee, not elsewhere classified  Muscle weakness (generalized)     Problem List Patient Active Problem List   Diagnosis Date Noted  . Complex tear of medial meniscus of right knee 03/27/2018  . Stress fracture of right tibia 03/27/2018  . Chronic pain of right knee 12/19/2017  . Genetic testing 03/08/2017  . Restrictive lung disease secondary to obesity 08/04/2015  . Physical deconditioning 08/04/2015  . OSA treated with BiPAP 08/04/2015  . Gastroesophageal reflux disease without esophagitis 12/25/2014  . Low serum vitamin D 05/28/2014  . Osteopenia 05/28/2014  . COPD (chronic obstructive pulmonary disease) ([place]) 01/29/2014  . Dyspnea on exertion 01/07/2014  . Fatigue 2013/03/04  . Murmur, cardiac 03-04-13  . Diabetes ([place]) 03-04-13  . Morbid obesity ([place]) 2013-03-04  . Family history of sudden death 04-Mar-2013  . Hyperlipidemia 2013/03/04  . Hypertension  12/20/2012  . GAD (generalized anxiety disorder) 12/20/2012  . Fibromyalgia muscle pain 12/20/2012    Standley Brooking, PTA 06/19/2018, 12:10 PM  [facility] [address] [place], [place], [address] Phone: [phone]   Fax:  [phone]  Name: Stacey Summers MRN: [id number] Date of Birth: 30-Nov-1945

## 2018-06-21 ENCOUNTER — Ambulatory Visit: Payer: PPO | Admitting: Physical Therapy

## 2018-06-21 ENCOUNTER — Encounter: Payer: Self-pay | Admitting: Physical Therapy

## 2018-06-21 DIAGNOSIS — M25561 Pain in right knee: Secondary | ICD-10-CM

## 2018-06-21 DIAGNOSIS — M25661 Stiffness of right knee, not elsewhere classified: Secondary | ICD-10-CM

## 2018-06-21 DIAGNOSIS — M6281 Muscle weakness (generalized): Secondary | ICD-10-CM

## 2018-06-21 NOTE — Therapy (Signed)
[facility] [place], [place], [address] Phone: [phone]   Fax:  [phone]  Physical Therapy Treatment  Patient Details  Name: Stacey Summers MRN: [id number] Date of Birth: May 13, 1946 Referring Provider (PT): Victorino December MD.   Encounter Date: 06/21/2018  PT End of Session - 06/21/18 1140    Visit Number  17    Number of Visits  24    Date for PT Re-Evaluation  06/21/18    Authorization Type  FOTO AT LEAST EVERY 5TH VISIT, 10TH VISIT PROGRESS NOTE AND KX MODIFIER AFTER THE 15 VISIT.    PT Start Time  1130   2 units secondary to late arrival   PT Stop Time  1206    PT Time Calculation (min)  36 min    Activity Tolerance  Patient tolerated treatment well;Treatment limited secondary to medical complications (Comment)    Behavior During Therapy  [facility] for tasks assessed/performed       Past Medical History:  Diagnosis Date  . Allergy    seasonal  . Anxiety   . Arthritis    hands, back   . Asthma    bronchitis  . Cataract    removed both eyes  . Chronic back pain    lower back   . COPD (chronic obstructive pulmonary disease) ([place])   . Diabetes mellitus without complication ([place])   . DJD (degenerative joint disease)   . Fibromyalgia   . Genetic testing 03/08/2017   Ms. Aldrete underwent genetic counseling and testing for hereditary cancer syndromes on 02/21/2017. Her results were negative for mutations in all 83 genes analyzed by [facility]'s 83-gene Multi-Cancers Panel. Genes analyzed include: ALK, APC, ATM, AXIN2, BAP1, BARD1, BLM, BMPR1A, BRCA1, BRCA2, BRIP1, CASR, CDC73, CDH1, CDK4, CDKN1B, CDKN1C, CDKN2A, CEBPA, CHEK2, CTNNA1, DICER1, DIS3L2, EGFR, EPCAM  . GERD (gastroesophageal reflux disease)   . History of abnormal mammogram   . Hyperlipidemia   . Hypertension   . Irregular heart beat   . Neuromuscular disorder (HCC)    fibromyalgia  . Sleep apnea    cpap some  . Tubular adenoma of colon 05/09/12    "innumerable number" of flat polyps    Past Surgical History:  Procedure Laterality Date  . CARPAL TUNNEL RELEASE Left 10/20/14   [facility]  . [place]  . CHOLECYSTECTOMY  1990  . COLONOSCOPY    . KNEE ARTHROSCOPY WITH SUBCHONDROPLASTY Right 03/27/2018   Procedure: Right knee arthroscopic partial medial meniscus repair versus menisectomy with medial tibia subchondroplasty;  Surgeon: Nicholes Stairs, MD;  Location: [facility];  Service: Orthopedics;  Laterality: Right;  120 mins  . LARYNX SURGERY     POLYP REMOVAL  . POLYPECTOMY     vocal cord  . TONSILLECTOMY  1965  . TUMOR REMOVAL     benign fatty tumors on abdomen    There were no vitals filed for this visit.  Subjective Assessment - 06/21/18 1138    Subjective  Reports not sleeping well last night secondary to tingling and nerve discomfort in lower legs.    Pertinent History  DJD; Osteopenia; chronic back pain; COPD; Fibromyalgia and irregular heart beat.    Limitations  Walking    How long can you walk comfortably?  Short distances in community.    Patient Stated Goals  Get back to normal.  "I have a lot to do."    Currently in Pain?  Yes    Pain Score  3  Pain Location  Knee    Pain Orientation  Right    Pain Descriptors / Indicators  Discomfort    Pain Type  Surgical pain    Pain Onset  More than a month ago         [facility] PT Assessment - 06/21/18 0001      Assessment   Medical Diagnosis  Right knee scope/medial sub chondroplasty.    Onset Date/Surgical Date  03/27/18    Next MD Visit  06/2018                   [facility] Adult PT Treatment/Exercise - 06/21/18 0001      Knee/Hip Exercises: Aerobic   Nustep  L3 x10 min      Knee/Hip Exercises: Standing   Hip Flexion  AROM;Right;20 reps;Knee straight    Forward Step Up  Right;20 reps;Hand Hold: 2;Step Height: 6"      Knee/Hip Exercises: Seated   Long Arc Quad  Strengthening;Right;2 sets;10 reps;Weights    Long Arc Quad Weight  3 lbs.       Modalities   Modalities  Management consultant  IFC    Electrical Stimulation Parameters  80-150 hz x10 min    Electrical Stimulation Goals  Edema;Pain      Vasopneumatic   Number Minutes Vasopneumatic   15 minutes    Vasopnuematic Location   Knee    Vasopneumatic Pressure  Low    Vasopneumatic Temperature   34               PT Short Term Goals - 05/31/18 1140      PT SHORT TERM GOAL #1   Title  Independent with an initial HEP.    Time  2    Period  Weeks    Status  Achieved      PT SHORT TERM GOAL #2   Title  Full active right knee extension.    Time  2    Period  Weeks    Status  --   -5 degees 05/31/18       PT Long Term Goals - 06/19/18 1147      PT LONG TERM GOAL #1   Title  Independent with an advanced HEP.    Time  4    Period  Weeks    Status  On-going      PT LONG TERM GOAL #2   Title  Active right knee flexion to 115 degrees+ so the patient can perform functional tasks and do so with pain not > 2-3/10.    Baseline  MET 05/09/18    Time  4    Period  Weeks    Status  Achieved      PT LONG TERM GOAL #3   Title  Increase right knee strength to a solid 4+/5 to provide good stability for accomplishment of functional activities.    Baseline  Met 05/17/18    Time  4    Period  Weeks    Status  Achieved      PT LONG TERM GOAL #4   Title  Perform a reciprocating stair gait with one railing with pain not > 2-3/10.    Baseline  4/10 overall pain with stairs and has to go slow    Time  4    Period  Weeks    Status  On-going      PT LONG TERM GOAL #5   Title  Perform ADL's with pain not > 2/10.    Baseline  4/10 pain overall    Time  4    Period  Weeks    Status  Achieved            Plan - 06/21/18 1201    Clinical Impression Statement  Patient tolerated today's treatment fairly well although limited by late  arrival and feeling unwell. Patient limited with therex as she required rest room break. More focus on quad strengthening secondary to patient complaints of knee buckling. Patient educated regarding TENS unit she purchased for home and educated regarding set up and parameters. Patient continues to require [facility] for ambulation for safety.    Rehab Potential  Good    PT Frequency  3x / week    PT Duration  4 weeks    PT Treatment/Interventions  ADLs/Self Care Home Management;Cryotherapy;Electrical Stimulation;Ultrasound;Moist Heat;Gait training;Stair training;Functional mobility training;Therapeutic activities;Therapeutic exercise;Neuromuscular re-education;Patient/family education;Passive range of motion;Manual techniques;Vasopneumatic Device    PT Next Visit Plan  cont with POC  to strength, ROM, activity tolerance and decrease pain. progress standing exercises when appropriate    Consulted and Agree with Plan of Care  Patient       Patient will benefit from skilled therapeutic intervention in order to improve the following deficits and impairments:  Abnormal gait, Pain, Decreased activity tolerance, Decreased range of motion, Decreased strength  Visit Diagnosis: Acute pain of right knee  Stiffness of right knee, not elsewhere classified  Muscle weakness (generalized)     Problem List Patient Active Problem List   Diagnosis Date Noted  . Complex tear of medial meniscus of right knee 03/27/2018  . Stress fracture of right tibia 03/27/2018  . Chronic pain of right knee 12/19/2017  . Genetic testing 03/08/2017  . Restrictive lung disease secondary to obesity 08/04/2015  . Physical deconditioning 08/04/2015  . OSA treated with BiPAP 08/04/2015  . Gastroesophageal reflux disease without esophagitis 12/25/2014  . Low serum vitamin D 05/28/2014  . Osteopenia 05/28/2014  . COPD (chronic obstructive pulmonary disease) ([place]) 01/29/2014  . Dyspnea on exertion 01/07/2014  . Fatigue March 19, 2013   . Murmur, cardiac 2013-03-19  . Diabetes ([place]) 03/19/13  . Morbid obesity ([place]) March 19, 2013  . Family history of sudden death 03/19/2013  . Hyperlipidemia March 19, 2013  . Hypertension 12/20/2012  . GAD (generalized anxiety disorder) 12/20/2012  . Fibromyalgia muscle pain 12/20/2012    Standley Brooking, PTA 06/21/2018, 12:18 PM  [facility] [address] [place], [place], [address] Phone: [phone]   Fax:  [phone]  Name: Stacey Summers MRN: [id number] Date of Birth: Dec 16, 1945

## 2018-06-25 ENCOUNTER — Ambulatory Visit: Payer: PPO | Admitting: Physical Therapy

## 2018-06-25 ENCOUNTER — Encounter: Payer: Self-pay | Admitting: Physical Therapy

## 2018-06-25 DIAGNOSIS — M25561 Pain in right knee: Secondary | ICD-10-CM | POA: Diagnosis not present

## 2018-06-25 DIAGNOSIS — M25661 Stiffness of right knee, not elsewhere classified: Secondary | ICD-10-CM

## 2018-06-25 DIAGNOSIS — M6281 Muscle weakness (generalized): Secondary | ICD-10-CM

## 2018-06-25 NOTE — Therapy (Signed)
[facility] [place], [place], [address] Phone: [phone]   Fax:  [phone]  Physical Therapy Treatment  Patient Details  Name: Stacey Summers MRN: [id number] Date of Birth: 09/02/1945 Referring Provider (PT): Victorino December MD.   Encounter Date: 06/25/2018  PT End of Session - 06/25/18 1214    Visit Number  18    Number of Visits  24    Date for PT Re-Evaluation  06/21/18    Authorization Type  FOTO AT LEAST EVERY 5TH VISIT, 10TH VISIT PROGRESS NOTE AND KX MODIFIER AFTER THE 15 VISIT.    PT Start Time  1133   Late for treatment.   PT Stop Time  1210    PT Time Calculation (min)  37 min    Activity Tolerance  Patient tolerated treatment well;Treatment limited secondary to medical complications (Comment)    Behavior During Therapy  [facility] for tasks assessed/performed       Past Medical History:  Diagnosis Date  . Allergy    seasonal  . Anxiety   . Arthritis    hands, back   . Asthma    bronchitis  . Cataract    removed both eyes  . Chronic back pain    lower back   . COPD (chronic obstructive pulmonary disease) ([place])   . Diabetes mellitus without complication ([place])   . DJD (degenerative joint disease)   . Fibromyalgia   . Genetic testing 03/08/2017   Ms. Pitney underwent genetic counseling and testing for hereditary cancer syndromes on 02/21/2017. Her results were negative for mutations in all 83 genes analyzed by [facility]'s 83-gene Multi-Cancers Panel. Genes analyzed include: ALK, APC, ATM, AXIN2, BAP1, BARD1, BLM, BMPR1A, BRCA1, BRCA2, BRIP1, CASR, CDC73, CDH1, CDK4, CDKN1B, CDKN1C, CDKN2A, CEBPA, CHEK2, CTNNA1, DICER1, DIS3L2, EGFR, EPCAM  . GERD (gastroesophageal reflux disease)   . History of abnormal mammogram   . Hyperlipidemia   . Hypertension   . Irregular heart beat   . Neuromuscular disorder (HCC)    fibromyalgia  . Sleep apnea    cpap some  . Tubular adenoma of colon 05/09/12   "innumerable  number" of flat polyps    Past Surgical History:  Procedure Laterality Date  . CARPAL TUNNEL RELEASE Left 10/20/14   [facility]  . [place]  . CHOLECYSTECTOMY  1990  . COLONOSCOPY    . KNEE ARTHROSCOPY WITH SUBCHONDROPLASTY Right 03/27/2018   Procedure: Right knee arthroscopic partial medial meniscus repair versus menisectomy with medial tibia subchondroplasty;  Surgeon: Nicholes Stairs, MD;  Location: [place];  Service: Orthopedics;  Laterality: Right;  120 mins  . LARYNX SURGERY     POLYP REMOVAL  . POLYPECTOMY     vocal cord  . TONSILLECTOMY  1965  . TUMOR REMOVAL     benign fatty tumors on abdomen    There were no vitals filed for this visit.  Subjective Assessment - 06/25/18 1227    Subjective  I'm doing okay today.  Lots of travel this weekend.    Pertinent History  DJD; Osteopenia; chronic back pain; COPD; Fibromyalgia and irregular heart beat.    How long can you walk comfortably?  Short distances in community.    Patient Stated Goals  Get back to normal.  "I have a lot to do."    Currently in Pain?  Yes    Pain Score  3     Pain Location  Knee    Pain Orientation  Right  Pain Descriptors / Indicators  Discomfort    Pain Onset  More than a month ago                       [facility] Adult PT Treatment/Exercise - 06/25/18 0001      Exercises   Exercises  Knee/Hip      Knee/Hip Exercises: Aerobic   Nustep  Level 3 x 10 minutes.      Knee/Hip Exercises: Seated   Long Arc [employer]    Long Arc Quad Limitations  3# x 3 minutes.      Knee/Hip Exercises: Supine   Short Arc Quad Sets Limitations  3# x 3 minutes.      Vasopneumatic   Number Minutes Vasopneumatic   15 minutes    Vasopnuematic Location   --   Right knee.   Vasopneumatic Pressure  Low               PT Short Term Goals - 05/31/18 1140      PT SHORT TERM GOAL #1   Title  Independent with an initial HEP.    Time  2    Period  Weeks    Status   Achieved      PT SHORT TERM GOAL #2   Title  Full active right knee extension.    Time  2    Period  Weeks    Status  --   -5 degees 05/31/18       PT Long Term Goals - 06/19/18 1147      PT LONG TERM GOAL #1   Title  Independent with an advanced HEP.    Time  4    Period  Weeks    Status  On-going      PT LONG TERM GOAL #2   Title  Active right knee flexion to 115 degrees+ so the patient can perform functional tasks and do so with pain not > 2-3/10.    Baseline  MET 05/09/18    Time  4    Period  Weeks    Status  Achieved      PT LONG TERM GOAL #3   Title  Increase right knee strength to a solid 4+/5 to provide good stability for accomplishment of functional activities.    Baseline  Met 05/17/18    Time  4    Period  Weeks    Status  Achieved      PT LONG TERM GOAL #4   Title  Perform a reciprocating stair gait with one railing with pain not > 2-3/10.    Baseline  4/10 overall pain with stairs and has to go slow    Time  4    Period  Weeks    Status  On-going      PT LONG TERM GOAL #5   Title  Perform ADL's with pain not > 2/10.    Baseline  4/10 pain overall    Time  4    Period  Weeks    Status  Achieved            Plan - 06/25/18 1231    Clinical Impression Statement  Focued on quad strengthening today.  She did well.  Shorter treatment due to late arrival.  She has used her TENS unit at home, therefore, held electrical stimulation for treatment today.      PT Treatment/Interventions  ADLs/Self Care Home Management;Cryotherapy;Electrical Stimulation;Ultrasound;Moist Heat;Gait training;Stair training;Functional mobility  training;Therapeutic activities;Therapeutic exercise;Neuromuscular re-education;Patient/family education;Passive range of motion;Manual techniques;Vasopneumatic Device    PT Next Visit Plan  cont with POC  to strength, ROM, activity tolerance and decrease pain. progress standing exercises when appropriate    Consulted and Agree with Plan of  Care  Patient       Patient will benefit from skilled therapeutic intervention in order to improve the following deficits and impairments:  Abnormal gait, Pain, Decreased activity tolerance, Decreased range of motion, Decreased strength  Visit Diagnosis: Acute pain of right knee  Stiffness of right knee, not elsewhere classified  Muscle weakness (generalized)     Problem List Patient Active Problem List   Diagnosis Date Noted  . Complex tear of medial meniscus of right knee 03/27/2018  . Stress fracture of right tibia 03/27/2018  . Chronic pain of right knee 12/19/2017  . Genetic testing 03/08/2017  . Restrictive lung disease secondary to obesity 08/04/2015  . Physical deconditioning 08/04/2015  . OSA treated with BiPAP 08/04/2015  . Gastroesophageal reflux disease without esophagitis 12/25/2014  . Low serum vitamin D 05/28/2014  . Osteopenia 05/28/2014  . COPD (chronic obstructive pulmonary disease) ([place]) 01/29/2014  . Dyspnea on exertion 01/07/2014  . Fatigue 03-06-13  . Murmur, cardiac 03-06-13  . Diabetes ([place]) 03/06/2013  . Morbid obesity ([place]) Mar 06, 2013  . Family history of sudden death 2013/03/06  . Hyperlipidemia 03/06/13  . Hypertension 12/20/2012  . GAD (generalized anxiety disorder) 12/20/2012  . Fibromyalgia muscle pain 12/20/2012    [place], [place] MPT 06/25/2018, 12:34 PM  [facility] [address] [place], [place], [address] Phone: [phone]   Fax:  [phone]  Name: ADAIRA CENTOLA MRN: [id number] Date of Birth: [birth year]

## 2018-06-27 ENCOUNTER — Ambulatory Visit (HOSPITAL_COMMUNITY)
Admission: RE | Admit: 2018-06-27 | Discharge: 2018-06-27 | Disposition: A | Discharge status: Home / Self Care | Payer: PPO | Source: Ambulatory Visit | Attending: Internal Medicine | Admitting: Internal Medicine

## 2018-06-27 DIAGNOSIS — M47816 Spondylosis without myelopathy or radiculopathy, lumbar region: Secondary | ICD-10-CM | POA: Insufficient documentation

## 2018-06-27 DIAGNOSIS — R0602 Shortness of breath: Secondary | ICD-10-CM | POA: Insufficient documentation

## 2018-06-27 DIAGNOSIS — D696 Thrombocytopenia, unspecified: Secondary | ICD-10-CM | POA: Diagnosis not present

## 2018-06-27 DIAGNOSIS — R911 Solitary pulmonary nodule: Secondary | ICD-10-CM | POA: Insufficient documentation

## 2018-06-27 DIAGNOSIS — R945 Abnormal results of liver function studies: Secondary | ICD-10-CM | POA: Diagnosis not present

## 2018-06-27 DIAGNOSIS — K7460 Unspecified cirrhosis of liver: Secondary | ICD-10-CM | POA: Diagnosis not present

## 2018-06-27 DIAGNOSIS — K449 Diaphragmatic hernia without obstruction or gangrene: Secondary | ICD-10-CM | POA: Diagnosis not present

## 2018-06-27 DIAGNOSIS — I700 Atherosclerosis of aorta: Secondary | ICD-10-CM | POA: Insufficient documentation

## 2018-06-27 DIAGNOSIS — K766 Portal hypertension: Secondary | ICD-10-CM | POA: Diagnosis not present

## 2018-06-27 MED ORDER — IOPAMIDOL (ISOVUE-300) INJECTION 61%
100.0000 mL | Freq: Once | INTRAVENOUS | Status: AC | PRN
  Administered 2018-06-27: 100 mL via INTRAVENOUS

## 2018-06-28 ENCOUNTER — Ambulatory Visit: Payer: PPO | Admitting: Physical Therapy

## 2018-06-28 ENCOUNTER — Encounter: Payer: Self-pay | Admitting: Physical Therapy

## 2018-06-28 ENCOUNTER — Ambulatory Visit (HOSPITAL_COMMUNITY): Payer: PPO

## 2018-06-28 DIAGNOSIS — M25661 Stiffness of right knee, not elsewhere classified: Secondary | ICD-10-CM

## 2018-06-28 DIAGNOSIS — M25561 Pain in right knee: Secondary | ICD-10-CM

## 2018-06-28 DIAGNOSIS — M6281 Muscle weakness (generalized): Secondary | ICD-10-CM

## 2018-06-28 NOTE — Therapy (Addendum)
[facility] [place], [place], [address] Phone: [phone]   Fax:  [phone]  Physical Therapy Treatment  PHYSICAL THERAPY DISCHARGE SUMMARY  Visits from Start of Care: 19  Current functional level related to goals / functional outcomes: See below   Remaining deficits: See goals   Education / Equipment: HEP  Plan: Patient agrees to discharge.  Patient goals were partially met. Patient is being discharged due to not returning since the last visit.  ?????   Gabriela Eves, PT, DPT 07/08/19   Patient Details  Name: Stacey Summers MRN: [id number] Date of Birth: April 20, 1946 Referring Provider (PT): Victorino December MD.   Encounter Date: 06/28/2018  PT End of Session - 06/28/18 1645    Visit Number  19    Number of Visits  24    Date for PT Re-Evaluation  06/21/18    Authorization Type  FOTO AT LEAST EVERY 5TH VISIT, 10TH VISIT PROGRESS NOTE AND KX MODIFIER AFTER THE 15 VISIT.    PT Start Time  1645    PT Stop Time  1738    PT Time Calculation (min)  53 min    Activity Tolerance  Patient tolerated treatment well;Treatment limited secondary to medical complications (Comment)    Behavior During Therapy  [facility] for tasks assessed/performed       Past Medical History:  Diagnosis Date  . Allergy    seasonal  . Anxiety   . Arthritis    hands, back   . Asthma    bronchitis  . Cataract    removed both eyes  . Chronic back pain    lower back   . COPD (chronic obstructive pulmonary disease) ([place])   . Diabetes mellitus without complication ([place])   . DJD (degenerative joint disease)   . Fibromyalgia   . Genetic testing 03/08/2017   Ms. Acocella underwent genetic counseling and testing for hereditary cancer syndromes on 02/21/2017. Her results were negative for mutations in all 83 genes analyzed by [facility]'s 83-gene Multi-Cancers Panel. Genes analyzed include: ALK, APC, ATM, AXIN2, BAP1, BARD1, BLM, BMPR1A, BRCA1,  BRCA2, BRIP1, CASR, CDC73, CDH1, CDK4, CDKN1B, CDKN1C, CDKN2A, CEBPA, CHEK2, CTNNA1, DICER1, DIS3L2, EGFR, EPCAM  . GERD (gastroesophageal reflux disease)   . History of abnormal mammogram   . Hyperlipidemia   . Hypertension   . Irregular heart beat   . Neuromuscular disorder (HCC)    fibromyalgia  . Sleep apnea    cpap some  . Tubular adenoma of colon 05/09/12   "innumerable number" of flat polyps    Past Surgical History:  Procedure Laterality Date  . CARPAL TUNNEL RELEASE Left 10/20/14   [facility]  . [place]  . CHOLECYSTECTOMY  1990  . COLONOSCOPY    . KNEE ARTHROSCOPY WITH SUBCHONDROPLASTY Right 03/27/2018   Procedure: Right knee arthroscopic partial medial meniscus repair versus menisectomy with medial tibia subchondroplasty;  Surgeon: Nicholes Stairs, MD;  Location: [facility];  Service: Orthopedics;  Laterality: Right;  120 mins  . LARYNX SURGERY     POLYP REMOVAL  . POLYPECTOMY     vocal cord  . TONSILLECTOMY  1965  . TUMOR REMOVAL     benign fatty tumors on abdomen    There were no vitals filed for this visit.  Subjective Assessment - 06/28/18 1651    Subjective  Patient reports pain is at 4/10. Patient states she is able to perform ADLs but does take increased time to perform activities. She  states doing anything going side to side causes increased pain.    Pertinent History  DJD; Osteopenia; chronic back pain; COPD; Fibromyalgia and irregular heart beat.    Limitations  Walking    How long can you walk comfortably?  Short distances in community.    Patient Stated Goals  Get back to normal.  "I have a lot to do."    Currently in Pain?  Yes    Pain Score  4     Pain Location  Knee    Pain Orientation  Right    Pain Descriptors / Indicators  Discomfort    Pain Type  Surgical pain    Pain Onset  More than a month ago         [facility] PT Assessment - 06/28/18 0001      Assessment   Medical Diagnosis  Right knee scope/medial sub chondroplasty.     Onset Date/Surgical Date  03/27/18    Next MD Visit  06/2018      AROM   Right Knee Extension  6    Right Knee Flexion  111      Strength   Right Knee Flexion  5/5    Right Knee Extension  4+/5   "Little bit of pain in anterior knee"                  [facility] Adult PT Treatment/Exercise - 06/28/18 0001      Exercises   Exercises  Knee/Hip      Knee/Hip Exercises: Aerobic   Nustep  Level 3 x 10 minutes.      Knee/Hip Exercises: Standing   Forward Step Up  Right;20 reps;Hand Hold: 2;Step Height: 4"    Step Down  Right;1 set;10 reps;Hand Hold: 2;Step Height: 4"      Knee/Hip Exercises: Seated   Long Arc Quad  Strengthening;2 sets;15 reps    Long Arc Quad Weight  3 lbs.    Hamstring Curl  Strengthening;Right;20 reps    Hamstring Limitations  red t-band      Modalities   Modalities  Psychologist, educational Location  R knee    Electrical Stimulation Action  IFC    Electrical Stimulation Parameters  80-150 hz x15 min    Electrical Stimulation Goals  Edema;Pain      Vasopneumatic   Number Minutes Vasopneumatic   15 minutes    Vasopnuematic Location   Knee    Vasopneumatic Pressure  Low    Vasopneumatic Temperature   34               PT Short Term Goals - 06/28/18 1740      PT SHORT TERM GOAL #1   Title  Independent with an initial HEP.    Time  2    Period  Weeks    Status  Achieved      PT SHORT TERM GOAL #2   Title  Full active right knee extension.    Time  2    Period  Weeks    Status  Not Met   6 degrees from neutral       PT Long Term Goals - 06/28/18 1648      PT LONG TERM GOAL #1   Title  Independent with an advanced HEP.    Period  Weeks    Status  Achieved      PT LONG TERM GOAL #2   Title  Active right knee flexion to 115 degrees+ so the patient can perform functional tasks and do so with pain not > 2-3/10.    Baseline  MET 05/09/18    Time  4    Period   Weeks    Status  Achieved      PT LONG TERM GOAL #3   Title  Increase right knee strength to a solid 4+/5 to provide good stability for accomplishment of functional activities.    Baseline  Met 05/17/18    Time  4    Period  Weeks    Status  Achieved      PT LONG TERM GOAL #4   Title  Perform a reciprocating stair gait with one railing with pain not > 2-3/10.    Baseline  4/10 overall pain with stairs and has to go slow    Time  4    Period  Weeks    Status  Partially Met   intermittent pain 4/10 at worst     PT LONG TERM GOAL #5   Title  Perform ADL's with pain not > 2/10.    Baseline  4/10 pain overall    Time  4    Period  Weeks    Status  Achieved            Plan - 06/28/18 1723    Clinical Impression Statement  Patient was able to tolerate treatment well but reports increased discomfort with step downs. Patient's right knee AROM today 6-111 degrees with reports of increased pain at end ranges. Patient still tender to palpation to medial aspect of the right knee. Patient's goals were partially met. Normal response to modalities at end of session.     Clinical Presentation  Stable    Clinical Decision Making  Low    Rehab Potential  Good    PT Frequency  3x / week    PT Duration  4 weeks    PT Treatment/Interventions  ADLs/Self Care Home Management;Cryotherapy;Electrical Stimulation;Ultrasound;Moist Heat;Gait training;Stair training;Functional mobility training;Therapeutic activities;Therapeutic exercise;Neuromuscular re-education;Patient/family education;Passive range of motion;Manual techniques;Vasopneumatic Device    PT Next Visit Plan  Continue with POC pending MD visit.     Consulted and Agree with Plan of Care  Patient       Patient will benefit from skilled therapeutic intervention in order to improve the following deficits and impairments:  Abnormal gait, Pain, Decreased activity tolerance, Decreased range of motion, Decreased strength  Visit Diagnosis: Acute  pain of right knee  Stiffness of right knee, not elsewhere classified  Muscle weakness (generalized)     Problem List Patient Active Problem List   Diagnosis Date Noted  . Complex tear of medial meniscus of right knee 03/27/2018  . Stress fracture of right tibia 03/27/2018  . Chronic pain of right knee 12/19/2017  . Genetic testing 03/08/2017  . Restrictive lung disease secondary to obesity 08/04/2015  . Physical deconditioning 08/04/2015  . OSA treated with BiPAP 08/04/2015  . Gastroesophageal reflux disease without esophagitis 12/25/2014  . Low serum vitamin D 05/28/2014  . Osteopenia 05/28/2014  . COPD (chronic obstructive pulmonary disease) ([place]) 01/29/2014  . Dyspnea on exertion 01/07/2014  . Fatigue 2013/03/21  . Murmur, cardiac 03-21-2013  . Diabetes ([place]) 2013/03/21  . Morbid obesity ([place]) 2013-03-21  . Family history of sudden death March 21, 2013  . Hyperlipidemia 21-Mar-2013  . Hypertension 12/20/2012  . GAD (generalized anxiety disorder) 12/20/2012  . Fibromyalgia muscle pain 12/20/2012   Gabriela Eves, PT, DPT 06/28/2018,  5:54 PM  [facility] [place], [place], [address] Phone: [phone]   Fax:  [phone]  Name: Stacey Summers MRN: [id number] Date of Birth: 1946/07/11

## 2018-07-02 ENCOUNTER — Ambulatory Visit: Payer: PPO | Admitting: *Deleted

## 2018-07-04 ENCOUNTER — Other Ambulatory Visit: Payer: Self-pay

## 2018-07-04 ENCOUNTER — Telehealth: Payer: Self-pay | Admitting: Gastroenterology

## 2018-07-04 ENCOUNTER — Inpatient Hospital Stay (HOSPITAL_COMMUNITY): Payer: PPO | Attending: Internal Medicine | Admitting: Internal Medicine

## 2018-07-04 ENCOUNTER — Encounter (HOSPITAL_COMMUNITY): Payer: Self-pay | Admitting: Internal Medicine

## 2018-07-04 DIAGNOSIS — M8580 Other specified disorders of bone density and structure, unspecified site: Secondary | ICD-10-CM | POA: Insufficient documentation

## 2018-07-04 DIAGNOSIS — Z7982 Long term (current) use of aspirin: Secondary | ICD-10-CM | POA: Diagnosis not present

## 2018-07-04 DIAGNOSIS — Z8051 Family history of malignant neoplasm of kidney: Secondary | ICD-10-CM | POA: Insufficient documentation

## 2018-07-04 DIAGNOSIS — M2550 Pain in unspecified joint: Secondary | ICD-10-CM | POA: Insufficient documentation

## 2018-07-04 DIAGNOSIS — Z79899 Other long term (current) drug therapy: Secondary | ICD-10-CM | POA: Insufficient documentation

## 2018-07-04 DIAGNOSIS — G4733 Obstructive sleep apnea (adult) (pediatric): Secondary | ICD-10-CM | POA: Insufficient documentation

## 2018-07-04 DIAGNOSIS — I700 Atherosclerosis of aorta: Secondary | ICD-10-CM | POA: Insufficient documentation

## 2018-07-04 DIAGNOSIS — Z8041 Family history of malignant neoplasm of ovary: Secondary | ICD-10-CM | POA: Insufficient documentation

## 2018-07-04 DIAGNOSIS — R011 Cardiac murmur, unspecified: Secondary | ICD-10-CM | POA: Diagnosis not present

## 2018-07-04 DIAGNOSIS — D696 Thrombocytopenia, unspecified: Secondary | ICD-10-CM | POA: Diagnosis not present

## 2018-07-04 DIAGNOSIS — Z87891 Personal history of nicotine dependence: Secondary | ICD-10-CM | POA: Insufficient documentation

## 2018-07-04 DIAGNOSIS — R918 Other nonspecific abnormal finding of lung field: Secondary | ICD-10-CM | POA: Diagnosis not present

## 2018-07-04 DIAGNOSIS — K219 Gastro-esophageal reflux disease without esophagitis: Secondary | ICD-10-CM | POA: Diagnosis not present

## 2018-07-04 DIAGNOSIS — R161 Splenomegaly, not elsewhere classified: Secondary | ICD-10-CM

## 2018-07-04 DIAGNOSIS — Z800 Family history of malignant neoplasm of digestive organs: Secondary | ICD-10-CM | POA: Diagnosis not present

## 2018-07-04 DIAGNOSIS — M47816 Spondylosis without myelopathy or radiculopathy, lumbar region: Secondary | ICD-10-CM

## 2018-07-04 DIAGNOSIS — M797 Fibromyalgia: Secondary | ICD-10-CM | POA: Insufficient documentation

## 2018-07-04 DIAGNOSIS — K7460 Unspecified cirrhosis of liver: Secondary | ICD-10-CM | POA: Insufficient documentation

## 2018-07-04 DIAGNOSIS — E119 Type 2 diabetes mellitus without complications: Secondary | ICD-10-CM | POA: Diagnosis not present

## 2018-07-04 DIAGNOSIS — K766 Portal hypertension: Secondary | ICD-10-CM | POA: Insufficient documentation

## 2018-07-04 DIAGNOSIS — J449 Chronic obstructive pulmonary disease, unspecified: Secondary | ICD-10-CM | POA: Diagnosis not present

## 2018-07-04 DIAGNOSIS — K449 Diaphragmatic hernia without obstruction or gangrene: Secondary | ICD-10-CM

## 2018-07-04 DIAGNOSIS — I10 Essential (primary) hypertension: Secondary | ICD-10-CM | POA: Diagnosis not present

## 2018-07-04 DIAGNOSIS — E785 Hyperlipidemia, unspecified: Secondary | ICD-10-CM | POA: Diagnosis not present

## 2018-07-04 NOTE — Patient Instructions (Signed)
[facility]  Discharge Instructions: You saw Dr. Higgs today                               _______________________________________________________________  Thank you for choosing [facility] to provide your oncology and hematology care.  To afford each patient quality time with our providers, please arrive at least 15 minutes before your scheduled appointment.  You need to re-schedule your appointment if you arrive 10 or more minutes late.  We strive to give you quality time with our providers, and arriving late affects you and other patients whose appointments are after yours.  Also, if you no show three or more times for appointments you may be dismissed from the clinic.  Again, thank you for choosing [facility]. Our hope is that these requests will allow you access to exceptional care and in a timely manner. _______________________________________________________________  If you have questions after your visit, please contact our office at [phone] between the hours of 8:30 a.m. and 5:00 p.m. Voicemails left after 4:30 p.m. will not be returned until the following business day. _______________________________________________________________  For prescription refill requests, have your pharmacy contact our office. _______________________________________________________________  Recommendations made by the consultant and any test results will be sent to your referring physician. _______________________________________________________________ 

## 2018-07-04 NOTE — Progress Notes (Signed)
Diagnosis Abnormal findings on diagnostic imaging of lung - Plan: CT Chest Wo Contrast, CBC with Differential/Platelet, Comprehensive metabolic panel, Lactate dehydrogenase  Staging Cancer Staging No matching staging information was found for the patient.  Assessment and Plan:  1.  Thrombocytopenia.  Labs done 05/30/2018 reviewed with pt and showed WBC 3.6 HB 13.1 plts 83,000.  Chemistries WNL with K+ 4, Cr 0.58 mildly elevated AST of 44, normal ALT of 28 and normal LDH of 172.  Plts are decreased from labs done  04/02/2018 when platelets were 98,000 she has a normal differential.  Coagulation studies done 03/27/2018 WNL and showed a PT 14.5 PTT 38.  ANA negative.  Path review of smear negative.    Plt count decreased further and was 83,000. CT abdomen and pelvis done 06/27/2018 reviewed and showed   1. Cirrhosis. 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 2 millimeter nodule in the RIGHT middle lobe. No follow-up needed if patient is low-risk. Non-contrast chest CT can be considered in 12 months if patient is high-risk. This recommendation follows the consensus statement: Guidelines for Management of Incidental Pulmonary Nodules Detected on CT Images: From the Fleischner Society 2017; Radiology 2017; 284:228-243. 3. Spleen is UPPER limits normal in size and otherwise unremarkable. 4. Hiatal hernia. 5.  Aortic atherosclerosis.  (ICD10-I70.0) 6. Lumbar spondylosis.     I discussed with pt she has evidence of cirrhosis and splenomegaly based on CT scan and this is the explanation of thrombocytopenia.  Pt denies having been told she had liver issues in the past.  She is referred to GI for evaluation and further work-up.  Pt will RTC in 06/2019 for follow-up and labs.    2.  Cirrhosis.  This was noted on recent CT scan.  Pt has evidence of cirrhosis and splenomegaly based on CT scan and this is the explanation of thrombocytopenia.   She is referred to GI for evaluation and further work-up.    3.  Joint pain.  She has negative SPEP, C-reactive protein, ANA, rheumatoid factor.  She has an elevated sed rate of 37.  She had right meniscus surgery done in August 2019.  Pt has fibromyalgia.  Follow-up with PCP.    4.  Elevated Bilirubin.  Labs showed elevated bilirubin dating back to 2017.  Bilirubin 1.8 on labs done 05/30/2018.  CT of abdomen and pelvis done 06/27/2018 shows cirrhosis, splenomegaly and evidence of cholecystectomy.  Pt has been referred to GI for evaluation.    5.  Family history of colon cancer.  She reports this occurred in her father.  Patient reports multiple polyps on colonoscopy evaluation and she has frequent exams due to this.  She had genetic testing done that was negative.  Pt should follow-up with GI as recommended.   6.  Hypertension.  Blood pressure is 140/69.  Follow-up with PCP.  7.  Pulmonary nodule.  Pt had a 2 mm right middle lobe nodule noted on CT scan done 06/27/2018.  Pulse ox is 96% on room air.  CXR done 06/27/2018 showed no acute process.  Pt denies smoking.  She is set up for CT chest in 06/2019 and will follow-up at that time to go over results.  8.  Health maintenance.  Mammogram done October 06/03/2017 was negative.  Colonoscopy done 12/06/2016 showed several polyps that were negative for malignancy.  Patient has undergone genetic testing that was negative. Continue mammogram and GI follow-up as recommended.  25 minutes spent with more than 50% spent in counseling and coordination of care.    Interval history:  Historical data obtained from note dated 04/02/2018.  72 year old female referred for evaluation due to thrombocytopenia.  She had recent surgery on her right knee.  She denies any bleeding with the surgery.  She denied nonsteroidal use.  She reports a family history of colon cancer in her father..  She has frequent GI evaluations due to showing evidence of multiple polyps.  Review  of chart shows labs done March 21, 2018 showed a white count of 3.8 hemoglobin 12.8 platelets 84,000 MCV was 101.  She had a normal differential.  Chemistries within normal limits other than an elevated bilirubin.  She reports she bruises easily.  She denies any chills, night sweats, and has noted no adenopathy.  She reported low-grade temperature after surgery.   Current Status:  Pt is seen today for follow-up.  She is here to go over CT scans.    Problem List Patient Active Problem List   Diagnosis Date Noted  . Complex tear of medial meniscus of right knee [S83.231A] 03/27/2018  . Stress fracture of right tibia [W43.154M] 03/27/2018  . Chronic pain of right knee [M25.561, G89.29] 12/19/2017  . Genetic testing [Z13.79] 03/08/2017  . 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 Physical deconditioning [R53.81] 08/04/2015  . OSA treated with BiPAP [G47.33] 08/04/2015  . Gastroesophageal reflux disease without esophagitis [K21.9] 12/25/2014  . Low serum vitamin D [R79.89] 05/28/2014  . Osteopenia [M85.80] 05/28/2014  . COPD (chronic obstructive pulmonary disease) ([place]) [J44.9] 01/29/2014  . Dyspnea on exertion [R06.09] 01/07/2014  . Fatigue [R53.83] 2013/03/07  . Murmur, cardiac [R01.1] 2013-03-07  . Diabetes ([place]) [E11.9] 03/07/13  . Morbid obesity ([place]) [E66.01] 03-07-2013  . Family history of sudden death [Z84.89] 2013/03/07  . Hyperlipidemia [E78.5] 07-Mar-2013  . Hypertension [I10] 12/20/2012  . GAD (generalized anxiety disorder) [F41.1] 12/20/2012  . Fibromyalgia muscle pain [M79.7] 12/20/2012    Past Medical History Past Medical History:  Diagnosis Date  . Allergy    seasonal  . Anxiety   . Arthritis    hands, back   . Asthma    bronchitis  . Cataract    removed both eyes  . Chronic back pain    lower back   . COPD (chronic obstructive pulmonary disease) ([place])   . Diabetes mellitus without complication ([place])   . DJD (degenerative  joint disease)   . Fibromyalgia   . Genetic testing 03/08/2017   Ms. Pommier underwent genetic counseling and testing for hereditary cancer syndromes on 02/21/2017. Her results were negative for mutations in all 83 genes analyzed by [facility]'s 83-gene Multi-Cancers Panel. Genes analyzed include: ALK, APC, ATM, AXIN2, BAP1, BARD1, BLM, BMPR1A, BRCA1, BRCA2, BRIP1, CASR, CDC73, CDH1, CDK4, CDKN1B, CDKN1C, CDKN2A, CEBPA, CHEK2, CTNNA1, DICER1, DIS3L2, EGFR, EPCAM  . GERD (gastroesophageal reflux disease)   . History of abnormal mammogram   . Hyperlipidemia   . Hypertension   . Irregular heart beat   . Neuromuscular disorder ([facility])    fibromyalgia  . Sleep apnea    cpap some  . Tubular adenoma of colon 05/09/12   "innumerable number" of flat polyps    Past Surgical History Past Surgical History:  Procedure Laterality Date  . CARPAL TUNNEL RELEASE Left 10/20/14   [facility]  . [place]  . CHOLECYSTECTOMY  1990  . COLONOSCOPY    . KNEE ARTHROSCOPY WITH SUBCHONDROPLASTY Right  03/27/2018   Procedure: Right knee arthroscopic partial medial meniscus repair versus menisectomy with medial tibia subchondroplasty;  Surgeon: Rogers, Jason Patrick, MD;  Location: [facility] OR;  Service: Orthopedics;  Laterality: Right;  120 mins  . LARYNX SURGERY     POLYP REMOVAL  . POLYPECTOMY     vocal cord  . TONSILLECTOMY  1965  . TUMOR REMOVAL     benign fatty tumors on abdomen    Family History Family History  Problem Relation Age of Onset  . Prostate cancer Father        d.89 diagnosed in late-60s  . Colon cancer Father        diagnosed at 70  . Heart failure Mother        d.37  . Heart disease Mother   . Colon polyps Brother        polyposis. Underwent partial colectomy at age 64.  . Spina bifida Brother        died at birth  . Stomach cancer Paternal Aunt   . Brain cancer Cousin 19       paternal first-cousin-once-removed  . Kidney cancer Cousin        d.78s maternal first-cousin  .  Other Other 16       multiple lipomas  . Colon cancer Paternal Uncle        in his 80s  . Heart failure Maternal Grandmother        d.45  . Heart failure Maternal Uncle        d.50  . Colon polyps Son 50  . Heart attack Son 50  . Esophageal cancer Neg Hx   . Rectal cancer Neg Hx      Social History  reports that she quit smoking about 48 years ago. Her smoking use included cigarettes. She has a 3.00 pack-year smoking history. She has never used smokeless tobacco. She reports that she does not drink alcohol or use drugs.  Medications  Current Outpatient Medications:  .  ALPRAZolam (XANAX) 0.25 MG tablet, Take 1 tablet (0.25 mg total) by mouth 2 (two) times daily as needed. (Patient taking differently: Take 0.25 mg by mouth 2 (two) times daily as needed for anxiety. ), Disp: 30 tablet, Rfl: 1 .  amLODipine (NORVASC) 5 MG tablet, Take 1 tablet (5 mg total) by mouth daily., Disp: 180 tablet, Rfl: 1 .  aspirin 81 MG tablet, Take 81 mg by mouth daily. , Disp: , Rfl:  .  atorvastatin (LIPITOR) 40 MG tablet, Take 1 tablet (40 mg total) by mouth daily., Disp: 90 tablet, Rfl: 1 .  benazepril (LOTENSIN) 40 MG tablet, Take 1 tablet (40 mg total) by mouth daily., Disp: 90 tablet, Rfl: 1 .  Blood Glucose Monitoring Suppl (LIBERTY BLOOD GLUCOSE MONITOR) DEVI, 1 each by Does not apply route daily. Test 1x per day and prn-dx250.02, Disp: 1 each, Rfl: 0 .  Carboxymethylcellul-Glycerin (LUBRICATING EYE DROPS OP), Place 1 drop into both eyes daily as needed (dry eyes)., Disp: , Rfl:  .  Cholecalciferol (VITAMIN D3) 5000 units CAPS, Take 5,000 Units by mouth daily., Disp: , Rfl:  .  cyclobenzaprine (FLEXERIL) 5 MG tablet, TAKE 1 TABLET BY MOUTH THREE TIMES DAILY AS NEEDED FOR MUSCLE SPASMS (NEEDS APPOINTMENT), Disp: 30 tablet, Rfl: 0 .  furosemide (LASIX) 40 MG tablet, TAKE 1 BY MOUTH EVERY MORNING AND 1/2 BY MOUTH AT 1 IN THE AFTERNOON (Patient taking differently: Take 20-40 mg by mouth See admin  instructions. Take 40 mg in   the morning and take 20 to 40 mg in the afternoon (based on swelling)), Disp: 135 tablet, Rfl: 1 .  glucose blood test strip, Test 1x per day and prn- dx 250.02, Disp: 100 each, Rfl: 2 .  HYDROcodone-acetaminophen (NORCO) 5-325 MG tablet, Norco 5 mg-325 mg tablet  Take 1 tablet every 6 hours by oral route., Disp: , Rfl:  .  meclizine (ANTIVERT) 25 MG tablet, Take 1 tablet (25 mg total) by mouth 3 (three) times daily as needed for dizziness., Disp: 30 tablet, Rfl: 0 .  metFORMIN (GLUCOPHAGE) 500 MG tablet, Take 1 tablet (500 mg total) by mouth 2 (two) times daily with a meal., Disp: 180 tablet, Rfl: 1 .  omeprazole (PRILOSEC) 40 MG capsule, Take 1 capsule (40 mg total) by mouth daily., Disp: 90 capsule, Rfl: 0 .  ondansetron (ZOFRAN ODT) 4 MG disintegrating tablet, Take 1 tablet (4 mg total) by mouth every 8 (eight) hours as needed., Disp: 20 tablet, Rfl: 0 .  ONE TOUCH ULTRA TEST test strip, USE ONE STRIP TO CHECK GLUCOSE ONCE DAILY, Disp: 100 each, Rfl: 1 .  ONETOUCH DELICA LANCETS 26O MISC, USE ONE LANCET TO CHECK GLUCOSE ONCE DAILY AND AS NEEDED, Disp: 100 each, Rfl: 0 .  oxyCODONE (ROXICODONE) 5 MG immediate release tablet, Take 1-2 tablets (5-10 mg total) by mouth every 4 (four) hours as needed for severe pain or breakthrough pain., Disp: 45 tablet, Rfl: 0 .  traMADol (ULTRAM) 50 MG tablet, Take 2 tablets (100 mg total) by mouth 3 (three) times daily. (Patient taking differently: Take 50 mg by mouth 3 (three) times daily as needed for moderate pain. ), Disp: 90 tablet, Rfl: 0 .  valACYclovir (VALTREX) 1000 MG tablet, valacyclovir 1 gram tablet, Disp: , Rfl:   Allergies Latex  Review of Systems Review of Systems - Oncology ROS negative other than occasional SOB.     Physical Exam  Vitals Wt Readings from Last 3 Encounters:  07/04/18 214 lb 8 oz (97.3 kg)  06/05/18 223 lb (101.2 kg)  04/20/18 222 lb 14.4 oz (101.1 kg)   Temp Readings from Last 3 Encounters:   07/04/18 97.8 F (36.6 C) (Oral)  06/05/18 98.1 F (36.7 C) (Oral)  04/20/18 98.1 F (36.7 C) (Oral)   BP Readings from Last 3 Encounters:  07/04/18 140/69  06/05/18 134/64  04/20/18 (!) 154/70   Pulse Readings from Last 3 Encounters:  07/04/18 68  06/05/18 66  04/20/18 72   Constitutional: Well-developed, well-nourished, and in no distress.   HENT: Head: Normocephalic and atraumatic.  Mouth/Throat: No oropharyngeal exudate. Mucosa moist. Eyes: Pupils are equal, round, and reactive to light. Conjunctivae are normal. No scleral icterus.  Neck: Normal range of motion. Neck supple. No JVD present.  Cardiovascular: Normal rate, regular rhythm and normal heart sounds.  Exam reveals no gallop and no friction rub.   No murmur heard. Pulmonary/Chest: Effort normal and breath sounds normal. No respiratory distress. No wheezes.No rales.  Abdominal: Soft. Bowel sounds are normal. No distension. Obese.  There is no tenderness. There is no guarding.  Musculoskeletal: No edema or tenderness.  Lymphadenopathy: No cervical, axillary or supraclavicular adenopathy.  Neurological: Alert and oriented to person, place, and time. No cranial nerve deficit.  Skin: Skin is warm and dry. No rash noted. No erythema. No pallor.  Psychiatric: Affect and judgment normal.   Labs No visits with results within 3 Day(s) from this visit.  Latest known visit with results is:  Appointment on 05/30/2018  Component Date Value Ref Range Status  . WBC 05/30/2018 3.6* 4.0 - 10.5 K/uL Final  . RBC 05/30/2018 4.31  3.87 - 5.11 MIL/uL Final  . Hemoglobin 05/30/2018 13.1  12.0 - 15.0 g/dL Final  . HCT 05/30/2018 41.0  36.0 - 46.0 % Final  . MCV 05/30/2018 95.1  80.0 - 100.0 fL Final  . MCH 05/30/2018 30.4  26.0 - 34.0 pg Final  . MCHC 05/30/2018 32.0  30.0 - 36.0 g/dL Final  . RDW 05/30/2018 13.2  11.5 - 15.5 % Final  . Platelets 05/30/2018 83* 150 - 400 K/uL Final   Comment: SPECIMEN CHECKED FOR CLOTS Immature  Platelet Fraction may be clinically indicated, consider ordering this additional test [id number] CONSISTENT WITH PREVIOUS RESULT   . nRBC 05/30/2018 0.0  0.0 - 0.2 % Final  . Neutrophils Relative % 05/30/2018 47  % Final  . Neutro Abs 05/30/2018 1.7  1.7 - 7.7 K/uL Final  . Lymphocytes Relative 05/30/2018 39  % Final  . Lymphs Abs 05/30/2018 1.4  0.7 - 4.0 K/uL Final  . Monocytes Relative 05/30/2018 9  % Final  . Monocytes Absolute 05/30/2018 0.3  0.1 - 1.0 K/uL Final  . Eosinophils Relative 05/30/2018 4  % Final  . Eosinophils Absolute 05/30/2018 0.2  0.0 - 0.5 K/uL Final  . Basophils Relative 05/30/2018 1  % Final  . Basophils Absolute 05/30/2018 0.0  0.0 - 0.1 K/uL Final  . Smear Review 05/30/2018 PLATELETS APPEAR DECREASED   Final   PLATELET COUNT CONFIRMED BY SMEAR  . Immature Granulocytes 05/30/2018 0  % Final  . Abs Immature Granulocytes 05/30/2018 0.01  0.00 - 0.07 K/uL Final   Performed at [facility], [address], [place], [place] [address]  . Sodium 05/30/2018 143  135 - 145 mmol/L Final  . Potassium 05/30/2018 4.0  3.5 - 5.1 mmol/L Final  . Chloride 05/30/2018 112* 98 - 111 mmol/L Final  . CO2 05/30/2018 28  22 - 32 mmol/L Final  . Glucose, Bld 05/30/2018 146* 70 - 99 mg/dL Final  . BUN 05/30/2018 18  8 - 23 mg/dL Final  . Creatinine, Ser 05/30/2018 0.58  0.44 - 1.00 mg/dL Final  . Calcium 05/30/2018 8.7* 8.9 - 10.3 mg/dL Final  . Total Protein 05/30/2018 6.3* 6.5 - 8.1 g/dL Final  . Albumin 05/30/2018 3.5  3.5 - 5.0 g/dL Final  . AST 05/30/2018 44* 15 - 41 U/L Final  . ALT 05/30/2018 28  0 - 44 U/L Final  . Alkaline Phosphatase 05/30/2018 121  38 - 126 U/L Final  . Total Bilirubin 05/30/2018 1.8* 0.3 - 1.2 mg/dL Final  . GFR calc non Af Amer 05/30/2018 >60  >60 mL/min Final  . GFR calc Af Amer 05/30/2018 >60  >60 mL/min Final   Comment: (NOTE) The eGFR has been calculated using the CKD EPI equation. This calculation has not been validated in all clinical  situations. eGFR's persistently <60 mL/min signify possible Chronic Kidney Disease.   Georgiann Hahn gap 05/30/2018 3* 5 - 15 Final   Performed at [facility], [address], [place], [place] [address]  . LDH 05/30/2018 172  98 - 192 U/L Final   Performed at [facility], [address], [place], [place] [address]     Pathology Orders Placed This Encounter  Procedures  . CT Chest Wo Contrast    Standing Status:   Future    Standing Expiration Date:   07/04/2019    Order Specific Question:  Preferred imaging location?    Answer:   [facility]    Order Specific Question:   Radiology Contrast Protocol - do NOT remove file path    Answer:   [email or web address]  . CBC with Differential/Platelet    Standing Status:   Future    Standing Expiration Date:   07/04/2020  . Comprehensive metabolic panel    Standing Status:   Future    Standing Expiration Date:   07/04/2020  . Lactate dehydrogenase    Standing Status:   Future    Standing Expiration Date:   07/04/2020       Vetta Higgs MD 

## 2018-07-05 ENCOUNTER — Encounter: Payer: Self-pay | Admitting: Gastroenterology

## 2018-07-05 NOTE — Telephone Encounter (Signed)
You can bring her in with an APP if she wants to be seen soon.

## 2018-07-06 DIAGNOSIS — Z96651 Presence of right artificial knee joint: Secondary | ICD-10-CM

## 2018-07-06 DIAGNOSIS — S82144D Nondisplaced bicondylar fracture of right tibia, subsequent encounter for closed fracture with routine healing: Secondary | ICD-10-CM | POA: Diagnosis not present

## 2018-07-06 HISTORY — DX: Presence of right artificial knee joint: Z96.651

## 2018-07-11 ENCOUNTER — Other Ambulatory Visit (INDEPENDENT_AMBULATORY_CARE_PROVIDER_SITE_OTHER): Payer: PPO

## 2018-07-11 ENCOUNTER — Encounter: Payer: Self-pay | Admitting: Gastroenterology

## 2018-07-11 ENCOUNTER — Ambulatory Visit (INDEPENDENT_AMBULATORY_CARE_PROVIDER_SITE_OTHER): Payer: PPO | Admitting: Gastroenterology

## 2018-07-11 VITALS — BP 110/62 | HR 80 | Ht 61.5 in | Wt 218.4 lb

## 2018-07-11 DIAGNOSIS — K7460 Unspecified cirrhosis of liver: Secondary | ICD-10-CM | POA: Diagnosis not present

## 2018-07-11 DIAGNOSIS — K7581 Nonalcoholic steatohepatitis (NASH): Secondary | ICD-10-CM | POA: Insufficient documentation

## 2018-07-11 LAB — CBC WITH DIFFERENTIAL/PLATELET
BASOS PCT: 0.9 % (ref 0.0–3.0)
Basophils Absolute: 0.1 10*3/uL (ref 0.0–0.1)
Eosinophils Absolute: 0.2 10*3/uL (ref 0.0–0.7)
Eosinophils Relative: 2.8 % (ref 0.0–5.0)
HEMATOCRIT: 40.6 % (ref 36.0–46.0)
Hemoglobin: 14 g/dL (ref 12.0–15.0)
LYMPHS PCT: 32.3 % (ref 12.0–46.0)
Lymphs Abs: 1.9 10*3/uL (ref 0.7–4.0)
MCHC: 34.4 g/dL (ref 30.0–36.0)
MCV: 93.1 fl (ref 78.0–100.0)
MONOS PCT: 10.1 % (ref 3.0–12.0)
Monocytes Absolute: 0.6 10*3/uL (ref 0.1–1.0)
NEUTROS ABS: 3.1 10*3/uL (ref 1.4–7.7)
NEUTROS PCT: 53.9 % (ref 43.0–77.0)
RBC: 4.36 Mil/uL (ref 3.87–5.11)
RDW: 14 % (ref 11.5–15.5)
WBC: 5.8 10*3/uL (ref 4.0–10.5)

## 2018-07-11 LAB — COMPREHENSIVE METABOLIC PANEL
ALT: 23 U/L (ref 0–35)
AST: 35 U/L (ref 0–37)
Albumin: 3.7 g/dL (ref 3.5–5.2)
Alkaline Phosphatase: 85 U/L (ref 39–117)
BUN: 16 mg/dL (ref 6–23)
CHLORIDE: 104 meq/L (ref 96–112)
CO2: 28 meq/L (ref 19–32)
Calcium: 9.5 mg/dL (ref 8.4–10.5)
Creatinine, Ser: 0.8 mg/dL (ref 0.40–1.20)
GFR: 75.3 mL/min (ref >60.00)
GLUCOSE: 131 mg/dL — AB (ref 70–99)
POTASSIUM: 4 meq/L (ref 3.5–5.1)
Sodium: 142 mEq/L (ref 135–145)
Total Bilirubin: 2 mg/dL — ABNORMAL HIGH (ref 0.2–1.2)
Total Protein: 6.3 g/dL (ref 6.0–8.3)

## 2018-07-11 LAB — FERRITIN: FERRITIN: 64.2 ng/mL (ref 10.0–291.0)

## 2018-07-11 LAB — PROTIME-INR
INR: 1.2 ratio — ABNORMAL HIGH (ref 0.8–1.0)
Prothrombin Time: 13.9 s — ABNORMAL HIGH (ref 9.6–13.1)

## 2018-07-11 LAB — IBC PANEL
IRON: 73 ug/dL (ref 42–145)
SATURATION RATIOS: 21.6 % (ref 20.0–50.0)
TRANSFERRIN: 241 mg/dL (ref 212.0–360.0)

## 2018-07-11 NOTE — Progress Notes (Signed)
07/11/2018 NATURE KUEKER [id number] 01-15-1946   HISTORY OF PRESENT ILLNESS:  This is a 72 year old female who is a patient of Dr. Woodward Ku.  She is here today to discuss a new diagnosis of cirrhosis.  Has associated thrombocytopenia, borderline splenomegaly, and varices by CT scan in the left upper quadrant all concerning for portal hypertension.  Platelets have been low for at least the past 9 years.  There has been no imaging of her abdomen until recently.  Presumably this is due to [place].  Does not drink alcohol.  Platelets are low at 83.  Labs show a mildly elevated total bilirubin over the past couple of years, not exceeding 2.9 with other LFTs within normal limits.  CT scan as follows:  IMPRESSION: 1. Cirrhosis. 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 2 millimeter nodule in the RIGHT middle lobe. No follow-up needed if patient is low-risk. Non-contrast chest CT can be considered in 12 months if patient is high-risk. This recommendation follows the consensus statement: Guidelines for Management of Incidental Pulmonary Nodules Detected on CT Images: From the Fleischner Society 2017; Radiology 2017; 284:228-243. 3. Spleen is UPPER limits normal in size and otherwise unremarkable. 4. Hiatal hernia. 5.  Aortic atherosclerosis.  (ICD10-I70.0) 6. Lumbar spondylosis.  She is on Lasix 40 to 60 mg daily for lower extremity swelling.  Denies any sign of black or bloody stools.  Has diffuse abdominal tenderness and discomfort but says that has been present for years.  Follows here regularly for colonoscopy for polyp surveillance and is scheduled for one in December.   Past Medical History:  Diagnosis Date  . Allergy    seasonal  . Anxiety   . Arthritis    hands, back   . Asthma    bronchitis  . Cataract    removed both eyes  . Chronic back pain    lower back   . COPD (chronic obstructive pulmonary  disease) ([place])   . Diabetes mellitus without complication ([place])   . DJD (degenerative joint disease)   . Fibromyalgia   . Genetic testing 03/08/2017   Ms. Bisson underwent genetic counseling and testing for hereditary cancer syndromes on 02/21/2017. Her results were negative for mutations in all 83 genes analyzed by [facility]'s 83-gene Multi-Cancers Panel. Genes analyzed include: ALK, APC, ATM, AXIN2, BAP1, BARD1, BLM, BMPR1A, BRCA1, BRCA2, BRIP1, CASR, CDC73, CDH1, CDK4, CDKN1B, CDKN1C, CDKN2A, CEBPA, CHEK2, CTNNA1, DICER1, DIS3L2, EGFR, EPCAM  . GERD (gastroesophageal reflux disease)   . History of abnormal mammogram   . Hyperlipidemia   . Hypertension   . Irregular heart beat   . Neuromuscular disorder (HCC)    fibromyalgia  . Sleep apnea    cpap some  . Tubular adenoma of colon 05/09/12   "innumerable number" of flat polyps   Past Surgical History:  Procedure Laterality Date  . CARPAL TUNNEL RELEASE Left 10/20/14   [facility]  . CATARACT EXTRACTION, BILATERAL    . [place]  . CHOLECYSTECTOMY  1990  . COLONOSCOPY    . KNEE ARTHROSCOPY WITH SUBCHONDROPLASTY Right 03/27/2018   Procedure: Right knee arthroscopic partial medial meniscus repair versus menisectomy with medial tibia subchondroplasty;  Surgeon: Nicholes Stairs, MD;  Location: [place];  Service: Orthopedics;  Laterality: Right;  120 mins  . LARYNX SURGERY     POLYP REMOVAL  . POLYPECTOMY     vocal cord  . TONSILLECTOMY  1965  .  TUMOR REMOVAL     benign fatty tumors on abdomen    reports that she quit smoking about 48 years ago. Her smoking use included cigarettes. She has a 3.00 pack-year smoking history. She has never used smokeless tobacco. She reports that she does not drink alcohol or use drugs. family history includes Colon cancer in her father and paternal uncle; Colon polyps in her brother; Colon polyps (age of onset: 41) in her son; Heart attack (age of onset: 26) in her son; Heart disease in her  mother; Heart failure in her maternal grandmother, maternal uncle, and mother; Kidney cancer in her cousin; Leukemia in her maternal aunt; Other (age of onset: 53) in her other; Prostate cancer in her father; Spina bifida in her brother; Stomach cancer in her paternal aunt. Allergies  Allergen Reactions  . Latex     "my hands feels numb"      Outpatient Encounter Medications as of 07/11/2018  Medication Sig  . ALPRAZolam (XANAX) 0.25 MG tablet Take 1 tablet (0.25 mg total) by mouth 2 (two) times daily as needed. (Patient taking differently: Take 0.25 mg by mouth 2 (two) times daily as needed for anxiety. )  . amLODipine (NORVASC) 5 MG tablet Take 1 tablet (5 mg total) by mouth daily.  Marland Kitchen atorvastatin (LIPITOR) 40 MG tablet Take 1 tablet (40 mg total) by mouth daily.  . benazepril (LOTENSIN) 40 MG tablet Take 1 tablet (40 mg total) by mouth daily.  . Blood Glucose Monitoring Suppl (LIBERTY BLOOD GLUCOSE MONITOR) DEVI 1 each by Does not apply route daily. Test 1x per day and prn-dx250.02  . Carboxymethylcellul-Glycerin (LUBRICATING EYE DROPS OP) Place 1 drop into both eyes daily as needed (dry eyes).  . Cholecalciferol (VITAMIN D3) 5000 units CAPS Take 5,000 Units by mouth daily.  . cyclobenzaprine (FLEXERIL) 5 MG tablet TAKE 1 TABLET BY MOUTH THREE TIMES DAILY AS NEEDED FOR MUSCLE SPASMS (NEEDS APPOINTMENT)  . furosemide (LASIX) 40 MG tablet TAKE 1 BY MOUTH EVERY MORNING AND 1/2 BY MOUTH AT 1 IN THE AFTERNOON (Patient taking differently: Take 20-40 mg by mouth See admin instructions. Take 40 mg in the morning and take 20 to 40 mg in the afternoon (based on swelling))  . glucose blood test strip Test 1x per day and prn- dx 250.02  . meclizine (ANTIVERT) 25 MG tablet Take 1 tablet (25 mg total) by mouth 3 (three) times daily as needed for dizziness.  . metFORMIN (GLUCOPHAGE) 500 MG tablet Take 1 tablet (500 mg total) by mouth 2 (two) times daily with a meal.  . omeprazole (PRILOSEC) 40 MG capsule  Take 1 capsule (40 mg total) by mouth daily.  . ondansetron (ZOFRAN ODT) 4 MG disintegrating tablet Take 1 tablet (4 mg total) by mouth every 8 (eight) hours as needed.  . ONE TOUCH ULTRA TEST test strip USE ONE STRIP TO CHECK GLUCOSE ONCE DAILY  . ONETOUCH DELICA LANCETS 74J MISC USE ONE LANCET TO CHECK GLUCOSE ONCE DAILY AND AS NEEDED  . traMADol (ULTRAM) 50 MG tablet Take 2 tablets (100 mg total) by mouth 3 (three) times daily. (Patient taking differently: Take 50 mg by mouth 3 (three) times daily as needed for moderate pain. )  . [DISCONTINUED] aspirin 81 MG tablet Take 81 mg by mouth daily.   . [DISCONTINUED] HYDROcodone-acetaminophen (NORCO) 5-325 MG tablet Norco 5 mg-325 mg tablet  Take 1 tablet every 6 hours by oral route.  . [DISCONTINUED] oxyCODONE (ROXICODONE) 5 MG immediate release tablet Take 1-2  tablets (5-10 mg total) by mouth every 4 (four) hours as needed for severe pain or breakthrough pain.  . [DISCONTINUED] valACYclovir (VALTREX) 1000 MG tablet valacyclovir 1 gram tablet   No facility-administered encounter medications on file as of 07/11/2018.      REVIEW OF SYSTEMS  : All other systems reviewed and negative except where noted in the History of Present Illness.   PHYSICAL EXAM: BP 110/62   Pulse 80   Ht 5' 1.5" (1.562 m)   Wt 218 lb 6.4 oz (99.1 kg)   BMI 40.60 kg/m  General: Well developed white female in no acute distress Head: Normocephalic and atraumatic Eyes:  Sclerae anicteric, conjunctiva pink. Ears: Normal auditory acuity Lungs: Clear throughout to auscultation; no increased WOB. Heart: Regular rate and rhythm; no M/R/G. Abdomen: Soft, non-distended.  BS present.  Mild diffuse TTP. Rectal:  Will be done at the time of upcoming colonoscopy that is already scheduled. Musculoskeletal: Symmetrical with no gross deformities  Skin: No lesions on visible extremities Extremities: No edema  Neurological: Alert oriented x 4, grossly non-focal Psychological:   Alert and cooperative. Normal mood and affect  ASSESSMENT AND PLAN: *72 year old female with newly diagnosed cirrhosis.  Has associated thrombocytopenia, borderline splenomegaly, and varices by CT scan in the left upper quadrant all concerning for portal hypertension.  Platelets have been low for at least the past 9 years.  There has been no imaging of her abdomen until recently.  Presumably this is due to [place].  Does not drink alcohol.  Will perform some liver serologies to rule out other causes of chronic liver disease.  We will also check an AFP and PT/INR.  She does need EGD as well to evaluate varices seen on CT scan.  I think she would be best served to have this done at [facility] as CT scan does show large varices, in case banding should need to be performed.  Unsure by CT scan if these are gastric or esophageal, however.  **We will discuss with Dr. Silverio Decamp regarding scheduling.  Patient is already scheduled for outpatient colonoscopy in the [place].  Should we reschedule colonoscopy and do endoscopy together at [facility] or perform colonoscopy here and schedule EGD for future at [facility]?  **I spent an extensive amount of time with the patient and her family discussing her new diagnosis, cause, potential complications, etc.  40 minutes were spent with the patient in which at least 50% of it was in counseling.   CC:  Higgs, Mathis Dad, MD

## 2018-07-11 NOTE — Patient Instructions (Addendum)
Go to the basement for labs today   We will contact you about your Endoscopy and colonoscopy appointments   If you are age 70 or older, your body mass index should be between 23-30. Your Body mass index is 40.6 kg/m. If this is out of the aforementioned range listed, please consider follow up with your Primary Care Provider.  If you are age 59 or younger, your body mass index should be between 19-25. Your Body mass index is 40.6 kg/m. If this is out of the aformentioned range listed, please consider follow up with your Primary Care Provider.    Thank you for choosing [facility]

## 2018-07-14 LAB — AFP TUMOR MARKER: AFP-Tumor Marker: 4.3 ng/mL

## 2018-07-14 LAB — HEPATITIS B SURFACE ANTIBODY,QUALITATIVE: Hep B S Ab: NONREACTIVE

## 2018-07-14 LAB — HEPATITIS A ANTIBODY, TOTAL: Hepatitis A AB,Total: REACTIVE — AB

## 2018-07-14 LAB — MITOCHONDRIAL ANTIBODIES

## 2018-07-14 LAB — HEPATITIS C ANTIBODY
HEP C AB: NONREACTIVE
SIGNAL TO CUT-OFF: 0.02 (ref <1.00)

## 2018-07-14 LAB — HEPATITIS B SURFACE ANTIGEN: HEP B S AG: NONREACTIVE

## 2018-07-14 LAB — ANTI-SMOOTH MUSCLE ANTIBODY, IGG: Actin (Smooth Muscle) Antibody (IGG): <20 U (ref <20)

## 2018-07-16 ENCOUNTER — Telehealth: Payer: Self-pay | Admitting: *Deleted

## 2018-07-16 LAB — ANA: ANA: POSITIVE — AB

## 2018-07-16 LAB — ANTI-NUCLEAR AB-TITER (ANA TITER)

## 2018-07-16 NOTE — Telephone Encounter (Signed)
===  View-only below this line===  ----- Message ----- From: Mauri Pole, MD Sent: 07/16/2018  10:22 AM EST To: Oda Kilts, CMA Subject: RE: ADD HOS EGD                                We can still do diagnostic EGD for variceal screening at [facility] if she meets criteria. I dont think she needs interventions like variceal banding at this point based on chart review. Ok to do procedure at [facility] if has no other contraindication.  Thanks VN ----- Message ----- From: Oda Kilts, CMA Sent: 07/11/2018  12:09 PM EST To: Mauri Pole, MD Subject: ADD HOS EGD                                    Dr Silverio Decamp this patient is scheduled for a recall colon in th [place] on 12/19  But she just seen Jess today and needs a EGD for Varices seen on CT scan and EGD needs to be done at the [facility].... Can you tell me when in Jan you want me to schedule EGD and Colon or do you want the patient to do separate procedures?  And lave procedure on in the [place]  Rescheduled patient from 12/19 to 12/24 at 11am  Patient aware will be done in our Endo center.

## 2018-07-18 ENCOUNTER — Telehealth: Payer: Self-pay | Admitting: Gastroenterology

## 2018-07-18 DIAGNOSIS — K7460 Unspecified cirrhosis of liver: Secondary | ICD-10-CM

## 2018-07-18 NOTE — Telephone Encounter (Signed)
The lab has been added to Epic, Hep B scheduled the pt and her husband are aware.  Orders for Hep B in the system.

## 2018-07-18 NOTE — Telephone Encounter (Signed)
Zehr, Laban Emperor, PA-C  Timothy Lasso, RN        Please let her know that her labs are all unremarkable at this point as to any other cause of her liver disease. We do actually need one more lab test, however. Please order IgG level for her to have drawn. Also, labs show that she is not immune to hepatitis B so she needs to be set up for those vaccinations.   Thank you,   Jess

## 2018-07-19 ENCOUNTER — Other Ambulatory Visit: Payer: Self-pay | Admitting: *Deleted

## 2018-07-19 ENCOUNTER — Other Ambulatory Visit: Payer: Self-pay

## 2018-07-19 ENCOUNTER — Ambulatory Visit (INDEPENDENT_AMBULATORY_CARE_PROVIDER_SITE_OTHER): Payer: PPO | Admitting: Gastroenterology

## 2018-07-19 ENCOUNTER — Telehealth: Payer: Self-pay | Admitting: Gastroenterology

## 2018-07-19 ENCOUNTER — Ambulatory Visit (AMBULATORY_SURGERY_CENTER): Payer: Self-pay | Admitting: *Deleted

## 2018-07-19 ENCOUNTER — Other Ambulatory Visit: Payer: PPO

## 2018-07-19 VITALS — Ht 61.0 in | Wt 217.4 lb

## 2018-07-19 DIAGNOSIS — K7460 Unspecified cirrhosis of liver: Secondary | ICD-10-CM | POA: Diagnosis not present

## 2018-07-19 DIAGNOSIS — Z23 Encounter for immunization: Secondary | ICD-10-CM

## 2018-07-19 DIAGNOSIS — K769 Liver disease, unspecified: Secondary | ICD-10-CM

## 2018-07-19 DIAGNOSIS — Z86010 Personal history of colonic polyps: Secondary | ICD-10-CM

## 2018-07-19 MED ORDER — PEG 3350-KCL-NABCB-NACL-NASULF 236 G PO SOLR: 4000.0000 mL | Freq: Once | ORAL | 0 refills | Status: AC

## 2018-07-19 NOTE — Progress Notes (Signed)
Patient denies any allergies to egg or soy products. Patient denies complications with anesthesia/sedation.  Patient denies oxygen use at home and denies diet medications. Patient denies information on colonoscopy and endoscopy.  Patient does not wear her CPAP nightly.  Patient states she uses CPAP "sometimes".

## 2018-07-19 NOTE — Telephone Encounter (Signed)
Pt's daughter [name] called requesting to speak with Janett Billow or her nurse regarding pt's health.

## 2018-07-19 NOTE — Telephone Encounter (Signed)
This pt needs to speak with Dr Silverio Decamp.  Shirlean Mylar spoke with her about the EGD on 12/24.

## 2018-07-20 LAB — IGG: IgG (Immunoglobin G), Serum: 969 mg/dL (ref 600–1540)

## 2018-07-20 NOTE — Telephone Encounter (Signed)
Questions about the test results, the upcoming procedure purpose and the diagnosis. Reviewed the assessment and plan from the note of her visit. Explained r/o varices was one of the goals of the EGD. Discussed the labs being a part of the investigation as to the cause of the cirrhosis.

## 2018-07-20 NOTE — Telephone Encounter (Signed)
Left message to call.

## 2018-07-24 ENCOUNTER — Other Ambulatory Visit (HOSPITAL_COMMUNITY): Payer: PPO

## 2018-07-24 ENCOUNTER — Encounter: Payer: Self-pay | Admitting: Gastroenterology

## 2018-07-26 ENCOUNTER — Telehealth: Payer: Self-pay | Admitting: Gastroenterology

## 2018-07-26 NOTE — Telephone Encounter (Signed)
Pt returning call with information

## 2018-07-26 NOTE — Telephone Encounter (Signed)
The pt called with a name of a rheumatologist Lenn Sink at [phone].  She is going to call her insurance and see if somewhere closer to her home is covered.

## 2018-07-27 NOTE — Telephone Encounter (Signed)
Left message on machine to call back  

## 2018-07-30 ENCOUNTER — Ambulatory Visit (HOSPITAL_COMMUNITY): Payer: PPO | Admitting: Internal Medicine

## 2018-07-30 NOTE — Telephone Encounter (Signed)
I have made the referral to rheumatology.  They will call the pt with an appt after reviewing the records.  Faxed records to [place]

## 2018-08-02 ENCOUNTER — Encounter: Payer: Self-pay | Admitting: Nurse Practitioner

## 2018-08-02 ENCOUNTER — Encounter: Payer: PPO | Admitting: Gastroenterology

## 2018-08-02 DIAGNOSIS — D696 Thrombocytopenia, unspecified: Secondary | ICD-10-CM | POA: Diagnosis not present

## 2018-08-02 DIAGNOSIS — M797 Fibromyalgia: Secondary | ICD-10-CM | POA: Diagnosis not present

## 2018-08-02 DIAGNOSIS — M2550 Pain in unspecified joint: Secondary | ICD-10-CM | POA: Diagnosis not present

## 2018-08-02 DIAGNOSIS — K7469 Other cirrhosis of liver: Secondary | ICD-10-CM | POA: Diagnosis not present

## 2018-08-02 DIAGNOSIS — M150 Primary generalized (osteo)arthritis: Secondary | ICD-10-CM | POA: Diagnosis not present

## 2018-08-02 DIAGNOSIS — R768 Other specified abnormal immunological findings in serum: Secondary | ICD-10-CM | POA: Diagnosis not present

## 2018-08-02 DIAGNOSIS — K766 Portal hypertension: Secondary | ICD-10-CM | POA: Diagnosis not present

## 2018-08-02 DIAGNOSIS — Z6841 Body Mass Index (BMI) 40.0 and over, adult: Secondary | ICD-10-CM | POA: Diagnosis not present

## 2018-08-07 ENCOUNTER — Encounter: Payer: Self-pay | Admitting: Gastroenterology

## 2018-08-07 ENCOUNTER — Ambulatory Visit (AMBULATORY_SURGERY_CENTER): Payer: PPO | Admitting: Gastroenterology

## 2018-08-07 VITALS — BP 119/46 | HR 80 | Temp 98.0°F | Resp 15 | Ht 61.0 in | Wt 217.0 lb

## 2018-08-07 DIAGNOSIS — Z86010 Personal history of colonic polyps: Secondary | ICD-10-CM | POA: Diagnosis not present

## 2018-08-07 DIAGNOSIS — D123 Benign neoplasm of transverse colon: Secondary | ICD-10-CM | POA: Diagnosis not present

## 2018-08-07 DIAGNOSIS — K7460 Unspecified cirrhosis of liver: Secondary | ICD-10-CM | POA: Diagnosis not present

## 2018-08-07 DIAGNOSIS — D122 Benign neoplasm of ascending colon: Secondary | ICD-10-CM

## 2018-08-07 DIAGNOSIS — K766 Portal hypertension: Secondary | ICD-10-CM | POA: Diagnosis not present

## 2018-08-07 MED ORDER — LACTULOSE 10 GM/15ML PO SOLN: 10.0000 g | Freq: Every day | ORAL | 11 refills | Status: DC

## 2018-08-07 MED ORDER — SODIUM CHLORIDE 0.9 % IV SOLN: 500.0000 mL | Freq: Once | INTRAVENOUS | Status: DC

## 2018-08-07 NOTE — Progress Notes (Signed)
Reviewed and agree with documentation and assessment and plan. K. Veena Nandigam , MD   

## 2018-08-07 NOTE — Op Note (Signed)
[facility] Patient Name: Stacey Summers Procedure Date: 08/07/2018 10:10 AM MRN: [id number] Endoscopist: Mauri Pole , MD Age: 72 Referring MD:  Date of Birth: 09-18-1945 Gender: Female Account #: [id number] Procedure:                Upper GI endoscopy Indications:              Cirrhosis with suspected esophageal varices, Portal                            hypertension with suspected esophageal varices Medicines:                Monitored Anesthesia Care Procedure:                Pre-Anesthesia Assessment:                           - Prior to the procedure, a History and Physical                            was performed, and patient medications and                            allergies were reviewed. The patient's tolerance of                            previous anesthesia was also reviewed. The risks                            and benefits of the procedure and the sedation                            options and risks were discussed with the patient.                            All questions were answered, and informed consent                            was obtained. Prior Anticoagulants: The patient has                            taken no previous anticoagulant or antiplatelet                            agents. ASA Grade Assessment: III - A patient with                            severe systemic disease. After reviewing the risks                            and benefits, the patient was deemed in                            satisfactory condition to undergo the procedure.  After obtaining informed consent, the endoscope was                            passed under direct vision. Throughout the                            procedure, the patient's blood pressure, pulse, and                            oxygen saturations were monitored continuously. The                            Endoscope was introduced through the mouth, and     advanced to the second part of duodenum. The upper                            GI endoscopy was accomplished without difficulty.                            The patient tolerated the procedure well. Scope In: Scope Out: Findings:                 The Z-line was regular and was found 36 cm from the                            incisors.                           No gross lesions were noted in the entire                            esophagus. No esophageal varices.                           The stomach was normal. No gastric varices.                           The examined duodenum was normal. Complications:            No immediate complications. Estimated Blood Loss:     Estimated blood loss: none. Impression:               - Z-line regular, 36 cm from the incisors.                           - No gross lesions in esophagus.                           - Normal stomach.                           - Normal examined duodenum.                           - No specimens collected. Recommendation:           - Patient has a contact number available for  emergencies. The signs and symptoms of potential                            delayed complications were discussed with the                            patient. Return to normal activities tomorrow.                            Written discharge instructions were provided to the                            patient.                           - Resume previous diet.                           - Continue present medications.                           - Return to my office in 6 months. Mauri Pole, MD 08/07/2018 11:16:35 AM This report has been signed electronically.

## 2018-08-07 NOTE — Patient Instructions (Signed)
Handouts given for polyps and hemorrhoids  YOU HAD AN ENDOSCOPIC PROCEDURE TODAY AT [facility]:   Refer to the procedure report that was given to you for any specific questions about what was found during the examination.  If the procedure report does not answer your questions, please call your gastroenterologist to clarify.  If you requested that your care partner not be given the details of your procedure findings, then the procedure report has been included in a sealed envelope for you to review at your convenience later.  YOU SHOULD EXPECT: Some feelings of bloating in the abdomen. Passage of more gas than usual.  Walking can help get rid of the air that was put into your GI tract during the procedure and reduce the bloating. If you had a lower endoscopy (such as a colonoscopy or flexible sigmoidoscopy) you may notice spotting of blood in your stool or on the toilet paper. If you underwent a bowel prep for your procedure, you may not have a normal bowel movement for a few days.  Please Note:  You might notice some irritation and congestion in your nose or some drainage.  This is from the oxygen used during your procedure.  There is no need for concern and it should clear up in a day or so.  SYMPTOMS TO REPORT IMMEDIATELY:   Following lower endoscopy (colonoscopy or flexible sigmoidoscopy):  Excessive amounts of blood in the stool  Significant tenderness or worsening of abdominal pains  Swelling of the abdomen that is new, acute  Fever of 100F or higher   Following upper endoscopy (EGD)  Vomiting of blood or coffee ground material  New chest pain or pain under the shoulder blades  Painful or persistently difficult swallowing  New shortness of breath  Fever of 100F or higher  Black, tarry-looking stools  For urgent or emergent issues, a gastroenterologist can be reached at any hour by calling [phone].   DIET:  We do recommend a small meal at first, but then  you may proceed to your regular diet.  Drink plenty of fluids but you should avoid alcoholic beverages for 24 hours.  ACTIVITY:  You should plan to take it easy for the rest of today and you should NOT DRIVE or use heavy machinery until tomorrow (because of the sedation medicines used during the test).    FOLLOW UP: Our staff will call the number listed on your records the next business day following your procedure to check on you and address any questions or concerns that you may have regarding the information given to you following your procedure. If we do not reach you, we will leave a message.  However, if you are feeling well and you are not experiencing any problems, there is no need to return our call.  We will assume that you have returned to your regular daily activities without incident.  If any biopsies were taken you will be contacted by phone or by letter within the next 1-3 weeks.  Please call us at [phone] if you have not heard about the biopsies in 3 weeks.    SIGNATURES/CONFIDENTIALITY: You and/or your care partner have signed paperwork which will be entered into your electronic medical record.  These signatures attest to the fact that that the information above on your After Visit Summary has been reviewed and is understood.  Full responsibility of the confidentiality of this discharge information lies with you and/or your care-partner.

## 2018-08-07 NOTE — Op Note (Signed)
[place] Patient Name: Stacey Summers Procedure Date: 08/07/2018 10:10 AM MRN: [id number] Endoscopist: Mauri Pole , MD Age: 72 Referring MD:  Date of Birth: 05/16/1946 Gender: Female Account #: [id number] Procedure:                Colonoscopy Indications:              High risk colon cancer surveillance: Personal                            history of adenoma (10 mm or greater in size) Medicines:                Monitored Anesthesia Care Procedure:                Pre-Anesthesia Assessment:                           - Prior to the procedure, a History and Physical                            was performed, and patient medications and                            allergies were reviewed. The patient's tolerance of                            previous anesthesia was also reviewed. The risks                            and benefits of the procedure and the sedation                            options and risks were discussed with the patient.                            All questions were answered, and informed consent                            was obtained. Prior Anticoagulants: The patient has                            taken no previous anticoagulant or antiplatelet                            agents. ASA Grade Assessment: III - A patient with                            severe systemic disease. After reviewing the risks                            and benefits, the patient was deemed in                            satisfactory condition to undergo the procedure.  After obtaining informed consent, the colonoscope                            was passed under direct vision. Throughout the                            procedure, the patient's blood pressure, pulse, and                            oxygen saturations were monitored continuously. The                            Colonoscope was introduced through the anus and                            advanced  to the the cecum, identified by                            appendiceal orifice and ileocecal valve. The                            colonoscopy was performed without difficulty. The                            patient tolerated the procedure well. The quality                            of the bowel preparation was excellent. The                            ileocecal valve, appendiceal orifice, and rectum                            were photographed. Scope In: 10:37:30 AM Scope Out: 92:33:00 AM Scope Withdrawal Time: 0 hours 17 minutes 34 seconds  Total Procedure Duration: 0 hours 28 minutes 42 seconds  Findings:                 The perianal and digital rectal examinations were                            normal.                           Four sessile polyps were found in the transverse                            colon and ascending colon. The polyps were 4 to 9                            mm in size. These polyps were removed with a cold                            snare. Resection and retrieval were complete.  Scattered small-mouthed diverticula were found in                            the sigmoid colon.                           Non-bleeding internal hemorrhoids were found during                            retroflexion. The hemorrhoids were small. Complications:            No immediate complications. Impression:               - Four 4 to 9 mm polyps in the transverse colon and                            in the ascending colon, removed with a cold snare.                            Resected and retrieved.                           - Diverticulosis in the sigmoid colon.                           - Non-bleeding internal hemorrhoids. Recommendation:           - Patient has a contact number available for                            emergencies. The signs and symptoms of potential                            delayed complications were discussed with the                             patient. Return to normal activities tomorrow.                            Written discharge instructions were provided to the                            patient.                           - Resume previous diet.                           - Continue present medications.                           - Await pathology results.                           - Repeat colonoscopy in 3 - 5 years for  surveillance based on pathology results. Mauri Pole, MD 08/07/2018 11:13:37 AM This report has been signed electronically.

## 2018-08-07 NOTE — Progress Notes (Signed)
PT taken to PACU. Monitors in place. VSS. Report given to RN. 

## 2018-08-07 NOTE — Progress Notes (Signed)
Called to room to assist during endoscopic procedure.  Patient ID and intended procedure confirmed with present staff. Received instructions for my participation in the procedure from the performing physician.  

## 2018-08-09 ENCOUNTER — Telehealth: Payer: Self-pay | Admitting: *Deleted

## 2018-08-09 NOTE — Telephone Encounter (Signed)
  Follow up Call-  Call back number 08/07/2018 12/06/2016  Post procedure Call Back phone  # [phone] cell  Permission to leave phone message Yes Yes  Some recent data might be hidden     Patient questions:  Do you have a fever, pain , or abdominal swelling? No. Pain Score  0 *  Have you tolerated food without any problems? Yes.    Have you been able to return to your normal activities? Yes.    Do you have any questions about your discharge instructions: Diet   No. Medications  No. Follow up visit  No.  Do you have questions or concerns about your Care? No.  Actions: * If pain score is 4 or above: No action needed, pain <4.

## 2018-08-20 ENCOUNTER — Encounter: Payer: Self-pay | Admitting: Gastroenterology

## 2018-08-20 ENCOUNTER — Ambulatory Visit (INDEPENDENT_AMBULATORY_CARE_PROVIDER_SITE_OTHER): Payer: PPO | Admitting: Gastroenterology

## 2018-08-20 DIAGNOSIS — Z23 Encounter for immunization: Secondary | ICD-10-CM

## 2018-08-29 ENCOUNTER — Encounter: Payer: Self-pay | Admitting: Family Medicine

## 2018-08-29 ENCOUNTER — Ambulatory Visit (INDEPENDENT_AMBULATORY_CARE_PROVIDER_SITE_OTHER): Payer: PPO | Admitting: Family Medicine

## 2018-08-29 VITALS — BP 101/56 | HR 74 | Temp 99.3°F | Ht 61.0 in | Wt 218.0 lb

## 2018-08-29 DIAGNOSIS — R059 Cough, unspecified: Secondary | ICD-10-CM

## 2018-08-29 DIAGNOSIS — J101 Influenza due to other identified influenza virus with other respiratory manifestations: Secondary | ICD-10-CM

## 2018-08-29 DIAGNOSIS — R05 Cough: Secondary | ICD-10-CM | POA: Diagnosis not present

## 2018-08-29 LAB — VERITOR FLU A/B WAIVED
Influenza A: POSITIVE — AB
Influenza B: NEGATIVE

## 2018-08-29 MED ORDER — OSELTAMIVIR PHOSPHATE 75 MG PO CAPS: 75.0000 mg | ORAL_CAPSULE | Freq: Two times a day (BID) | ORAL | 0 refills | Status: AC

## 2018-08-29 MED ORDER — BENZONATATE 100 MG PO CAPS: 100.0000 mg | ORAL_CAPSULE | Freq: Three times a day (TID) | ORAL | 0 refills | Status: DC | PRN

## 2018-08-29 NOTE — Progress Notes (Signed)
Subjective: CC: flu like symptoms2 PCP: Chevis Pretty, FNP NLG:XQJJH L [place] is a 73 y.o. female presenting to clinic today for:  1. Flu like symptoms Patient is accompanied by her family member who notes that she had acute onset of flulike symptoms Monday evening.  She describes this as a dry cough, fatigue, headache.  No nausea, vomiting or diarrhea.  She has been using Mucinex with little improvement in symptoms.  She has had her flu shot this year.   ROS: Per HPI  Allergies  Allergen Reactions  . Latex     "my hands feels numb"   Past Medical History:  Diagnosis Date  . Allergy    seasonal  . Anxiety   . Arthritis    hands, back   . Cataract    surgery to remove  . Chronic back pain    lower back   . Cirrhosis of liver without ascites ([place])    recent dx 07/11/18  . COPD (chronic obstructive pulmonary disease) ([facility])    no inhaler  . Diabetes mellitus without complication ([place])   . DJD (degenerative joint disease)   . Fibromyalgia   . Genetic testing 03/08/2017   Ms. Fayette underwent genetic counseling and testing for hereditary cancer syndromes on 02/21/2017. Her results were negative for mutations in all 83 genes analyzed by [facility]'s 83-gene Multi-Cancers Panel. Genes analyzed include: ALK, APC, ATM, AXIN2, BAP1, BARD1, BLM, BMPR1A, BRCA1, BRCA2, BRIP1, CASR, CDC73, CDH1, CDK4, CDKN1B, CDKN1C, CDKN2A, CEBPA, CHEK2, CTNNA1, DICER1, DIS3L2, EGFR, EPCAM  . GERD (gastroesophageal reflux disease)   . 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 History of abnormal mammogram   . Hyperlipidemia   . Hypertension   . Irregular heart beat   . Neuromuscular disorder (H[facility])    fibromyalgia  . Sleep apnea    cpap some  . SVD (spontaneous vaginal delivery)    x 2  . Tubular adenoma of colon 05/09/12   "innumerable number" of flat polyps    Current Outpatient Medications:  .  ALPRAZolam (XANAX) 0.25 MG tablet, Take 1 tablet (0.25 mg total) by mouth 2  (two) times daily as needed. (Patient taking differently: Take 0.25 mg by mouth 2 (two) times daily as needed for anxiety. ), Disp: 30 tablet, Rfl: 1 .  amLODipine (NORVASC) 5 MG tablet, Take 1 tablet (5 mg total) by mouth daily., Disp: 180 tablet, Rfl: 1 .  atorvastatin (LIPITOR) 40 MG tablet, Take 1 tablet (40 mg total) by mouth daily., Disp: 90 tablet, Rfl: 1 .  benazepril (LOTENSIN) 40 MG tablet, Take 1 tablet (40 mg total) by mouth daily., Disp: 90 tablet, Rfl: 1 .  Blood Glucose Monitoring Suppl (LIBERTY BLOOD GLUCOSE MONITOR) DEVI, 1 each by Does not apply route daily. Test 1x per day and prn-dx250.02, Disp: 1 each, Rfl: 0 .  Carboxymethylcellul-Glycerin (LUBRICATING EYE DROPS OP), Place 1 drop into both eyes daily as needed (dry eyes)., Disp: , Rfl:  .  Cholecalciferol (VITAMIN D3) 5000 units CAPS, Take 5,000 Units by mouth daily., Disp: , Rfl:  .  cyclobenzaprine (FLEXERIL) 5 MG tablet, TAKE 1 TABLET BY MOUTH THREE TIMES DAILY AS NEEDED FOR MUSCLE SPASMS (NEEDS APPOINTMENT), Disp: 30 tablet, Rfl: 0 .  furosemide (LASIX) 40 MG tablet, TAKE 1 BY MOUTH EVERY MORNING AND 1/2 BY MOUTH AT 1 IN THE AFTERNOON (Patient taking differently: Take 20-40 mg by mouth See admin instructions. Take 40 mg in the morning and take 20 to 40  mg in the afternoon (based on swelling)), Disp: 135 tablet, Rfl: 1 .  glucose blood test strip, Test 1x per day and prn- dx 250.02, Disp: 100 each, Rfl: 2 .  lactulose (CHRONULAC) 10 GM/15ML solution, Take 15 mLs (10 g total) by mouth daily., Disp: 236 mL, Rfl: 11 .  meclizine (ANTIVERT) 25 MG tablet, Take 1 tablet (25 mg total) by mouth 3 (three) times daily as needed for dizziness., Disp: 30 tablet, Rfl: 0 .  metFORMIN (GLUCOPHAGE) 500 MG tablet, Take 1 tablet (500 mg total) by mouth 2 (two) times daily with a meal., Disp: 180 tablet, Rfl: 1 .  omeprazole (PRILOSEC) 40 MG capsule, Take 1 capsule (40 mg total) by mouth daily., Disp: 90 capsule, Rfl: 0 .  ondansetron (ZOFRAN  ODT) 4 MG disintegrating tablet, Take 1 tablet (4 mg total) by mouth every 8 (eight) hours as needed. (Patient not taking: Reported on 07/19/2018), Disp: 20 tablet, Rfl: 0 .  ONE TOUCH ULTRA TEST test strip, USE ONE STRIP TO CHECK GLUCOSE ONCE DAILY, Disp: 100 each, Rfl: 1 .  ONETOUCH DELICA LANCETS 51V MISC, USE ONE LANCET TO CHECK GLUCOSE ONCE DAILY AND AS NEEDED, Disp: 100 each, Rfl: 0 .  traMADol (ULTRAM) 50 MG tablet, Take 2 tablets (100 mg total) by mouth 3 (three) times daily. (Patient taking differently: Take 50 mg by mouth 3 (three) times daily as needed for moderate pain. ), Disp: 90 tablet, Rfl: 0 .  UNABLE TO FIND, Place 1 drop under the tongue. Med Name: Hemp Oil, One drop daily under tongue, Disp: , Rfl:  Social History   Socioeconomic History  . Marital status: Married    Spouse name: Not on file  . Number of children: 3  . Years of education: Not on file  . Highest education level: Not on file  Occupational History  . Occupation: retired  Scientific laboratory technician  . Financial resource strain: Not on file  . Food insecurity:    Worry: Not on file    Inability: Not on file  . Transportation needs:    Medical: Not on file    Non-medical: Not on file  Tobacco Use  . Smoking status: Former Smoker    Packs/day: 1.00    Years: 3.00    Pack years: 3.00    Types: Cigarettes    Last attempt to quit: 08/15/1969    Years since quitting: 49.0  . Smokeless tobacco: Never Used  Substance and Sexual Activity  . Alcohol use: No    Alcohol/week: 0.0 standard drinks  . Drug use: No  . Sexual activity: Not Currently  Lifestyle  . Physical activity:    Days per week: Not on file    Minutes per session: Not on file  . Stress: Not on file  Relationships  . Social connections:    Talks on phone: Not on file    Gets together: Not on file    Attends religious service: Not on file    Active member of club or organization: Not on file    Attends meetings of clubs or organizations: Not on file     Relationship status: Not on file  . Intimate partner violence:    Fear of current or ex partner: Not on file    Emotionally abused: Not on file    Physically abused: Not on file    Forced sexual activity: Not on file  Other Topics Concern  . Not on file  Social History Narrative  . Not  on file   Family History  Problem Relation Age of Onset  . Prostate cancer Father        d.89 diagnosed in late-60s  . Colon cancer Father        diagnosed at 21  . Heart failure Mother        d.37  . Heart disease Mother   . Colon polyps Brother        polyposis. Underwent partial colectomy at age 79.  Marland Kitchen Spina bifida Brother        died at birth  . Stomach cancer Paternal Aunt        questionable  . Kidney cancer Cousin        d.78s maternal first-cousin  . Other Other 16       multiple lipomas  . Colon cancer Paternal Uncle        in his 75s  . Heart failure Maternal Grandmother        d.45  . Heart failure Maternal Uncle        d.50  . Colon polyps Son 67  . Heart attack Son 2  . Leukemia Maternal Aunt   . Esophageal cancer Neg Hx   . Rectal cancer Neg Hx     Objective: Office vital signs reviewed. BP (!) 101/56   Pulse 74   Temp 99.3 F (37.4 C)   Ht 5' 1"  (1.549 m)   Wt 218 lb (98.9 kg)   BMI 41.19 kg/m   Physical Examination:  General: Awake, alert, well nourished, nonotoxic. No acute distress HEENT: Normal    Neck: No masses palpated. No lymphadenopathy    Ears: Tympanic membranes intact, normal light reflex, no erythema, no bulging    Eyes: PERRLA, extraocular membranes intact, sclera white    Nose: nasal turbinates moist, clear nasal discharge    Throat: moist mucus membranes, no erythema, no tonsillar exudate.  Airway is patent Cardio: regular rate and rhythm, S1S2 heard, no murmurs appreciated Pulm: clear to auscultation bilaterally, no wheezes, rhonchi or rales; normal work of breathing on room air  Assessment/ Plan: 73 y.o. female   1. Influenza  A Patient with low-grade fever here in office.  Her rapid flu test was positive for influenza A.  She is inside the treatment window and I have prescribed Tamiflu 75 mg p.o. twice daily for the next 5 days.  We discussed the possible side effects of this medication.  Tessalon Perles prescribed for cough.  Home care instructions reviewed and reasons for return and emergent evaluation discussed.  She will follow-up PRN.  2. Cough  - Veritor Flu A/B Waived  No orders of the defined types were placed in this encounter.  Meds ordered this encounter  Medications  . oseltamivir (TAMIFLU) 75 MG capsule    Sig: Take 1 capsule (75 mg total) by mouth 2 (two) times daily for 5 days.    Dispense:  10 capsule    Refill:  0  . benzonatate (TESSALON PERLES) 100 MG capsule    Sig: Take 1 capsule (100 mg total) by mouth 3 (three) times daily as needed.    Dispense:  20 capsule    Refill:  Henry, DO [place] [phone]

## 2018-08-29 NOTE — Patient Instructions (Signed)

## 2018-08-31 ENCOUNTER — Other Ambulatory Visit: Payer: Self-pay | Admitting: Nurse Practitioner

## 2018-08-31 DIAGNOSIS — I10 Essential (primary) hypertension: Secondary | ICD-10-CM

## 2018-09-28 DIAGNOSIS — G4733 Obstructive sleep apnea (adult) (pediatric): Secondary | ICD-10-CM | POA: Diagnosis not present

## 2018-10-01 ENCOUNTER — Other Ambulatory Visit: Payer: Self-pay | Admitting: Nurse Practitioner

## 2018-10-01 DIAGNOSIS — I10 Essential (primary) hypertension: Secondary | ICD-10-CM

## 2018-10-02 NOTE — Telephone Encounter (Signed)
MMM. NTBS 30 days given 08/31/18

## 2018-10-02 NOTE — Telephone Encounter (Signed)
Left message to call back to schedule apt.

## 2018-10-09 ENCOUNTER — Ambulatory Visit (INDEPENDENT_AMBULATORY_CARE_PROVIDER_SITE_OTHER): Payer: PPO | Admitting: Family Medicine

## 2018-10-09 ENCOUNTER — Encounter: Payer: Self-pay | Admitting: Family Medicine

## 2018-10-09 VITALS — BP 117/64 | HR 69 | Temp 97.0°F | Ht 66.0 in | Wt 215.0 lb

## 2018-10-09 DIAGNOSIS — E782 Mixed hyperlipidemia: Secondary | ICD-10-CM

## 2018-10-09 DIAGNOSIS — D696 Thrombocytopenia, unspecified: Secondary | ICD-10-CM

## 2018-10-09 DIAGNOSIS — I10 Essential (primary) hypertension: Secondary | ICD-10-CM

## 2018-10-09 DIAGNOSIS — R600 Localized edema: Secondary | ICD-10-CM | POA: Diagnosis not present

## 2018-10-09 DIAGNOSIS — G8929 Other chronic pain: Secondary | ICD-10-CM | POA: Diagnosis not present

## 2018-10-09 DIAGNOSIS — M5442 Lumbago with sciatica, left side: Secondary | ICD-10-CM

## 2018-10-09 DIAGNOSIS — K219 Gastro-esophageal reflux disease without esophagitis: Secondary | ICD-10-CM

## 2018-10-09 DIAGNOSIS — E119 Type 2 diabetes mellitus without complications: Secondary | ICD-10-CM | POA: Diagnosis not present

## 2018-10-09 LAB — BAYER DCA HB A1C WAIVED: HB A1C (BAYER DCA - WAIVED): 5.9 % (ref <7.0)

## 2018-10-09 MED ORDER — AMLODIPINE BESYLATE 5 MG PO TABS: 5.0000 mg | ORAL_TABLET | Freq: Every day | ORAL | 1 refills | Status: DC

## 2018-10-09 MED ORDER — BENAZEPRIL HCL 40 MG PO TABS: 40.0000 mg | ORAL_TABLET | Freq: Every day | ORAL | 1 refills | Status: DC

## 2018-10-09 MED ORDER — TRAMADOL HCL 50 MG PO TABS: 50.0000 mg | ORAL_TABLET | Freq: Two times a day (BID) | ORAL | 0 refills | Status: DC | PRN

## 2018-10-09 MED ORDER — METFORMIN HCL 500 MG PO TABS: 500.0000 mg | ORAL_TABLET | Freq: Two times a day (BID) | ORAL | 1 refills | Status: DC

## 2018-10-09 MED ORDER — ATORVASTATIN CALCIUM 40 MG PO TABS: 40.0000 mg | ORAL_TABLET | Freq: Every day | ORAL | 1 refills | Status: DC

## 2018-10-09 MED ORDER — FUROSEMIDE 40 MG PO TABS: ORAL_TABLET | ORAL | 1 refills | Status: DC

## 2018-10-09 MED ORDER — OMEPRAZOLE 40 MG PO CPDR: 40.0000 mg | DELAYED_RELEASE_CAPSULE | Freq: Every day | ORAL | 1 refills | Status: DC

## 2018-10-09 NOTE — Patient Instructions (Signed)
You had labs performed today.  You will be contacted with the results of the labs once they are available, usually in the next 3 business days for routine lab work.

## 2018-10-09 NOTE — Progress Notes (Signed)
Subjective: CC: f/u DM2, GAD PCP: Janora Norlander, DO VPX:TGGYI L Hinger is a 73 y.o. female presenting to clinic today for:  1. Type 2 Diabetes w/ HTN, HLD:  Patient reports High at home: 200s; Low at home: 140, Taking medication(s): Metformin 500 mg p.o. twice daily, Lipitor 40 mg daily, Norvasc 5 mg daily, benazepril 40 mg daily and Lasix, Side effects: none  Last eye exam: UTD, sees [facility] now. Last foot exam: needs Last A1c:  Lab Results  Component Value Date   HGBA1C 6.2 02/06/2018   Nephropathy screen indicated?: on ACE-I Last flu, zoster and/or pneumovax:  Immunization History  Administered Date(s) Administered  . Hepb-cpg 07/19/2018, 08/20/2018  . Influenza, High Dose Seasonal PF 08/03/2018  . Influenza,inj,Quad PF,6+ Mos 05/28/2014, 06/16/2015, 06/10/2016  . Influenza-Unspecified 04/24/2017  . Pneumococcal Conjugate-13 07/21/2014  . Pneumococcal Polysaccharide-23 09/21/2015  . Zoster Recombinat (Shingrix) 04/24/2017, 07/11/2017    ROS: denies unintended weight loss/gain, foot ulcerations, numbness or tingling in extremities or chest pain.  She reports chronic lower extremity edema that is worse at the end of the day and better each morning.  2. GAD Patient reports longstanding history of generalized anxiety disorder.  She has Xanax on hand if needed and notes a less than 1 pill/month usage.  She is not on any daily controller medication for anxiety or depression.  No SI or HI.  ROS: Per HPI  Allergies  Allergen Reactions  . Latex     "my hands feels numb"   Past Medical History:  Diagnosis Date  . Allergy    seasonal  . Anxiety   . Arthritis    hands, back   . Cataract    surgery to remove  . Chronic back pain    lower back   . Cirrhosis of liver without ascites ([place])    recent dx 07/11/18  . COPD (chronic obstructive pulmonary disease) (HCC)    no inhaler  . Diabetes mellitus without complication ([place])   . DJD (degenerative joint  disease)   . Fibromyalgia   . Genetic testing 03/08/2017   Ms. Fofana underwent genetic counseling and testing for hereditary cancer syndromes on 02/21/2017. Her results were negative for mutations in all 83 genes analyzed by [facility]'s 83-gene Multi-Cancers Panel. Genes analyzed include: ALK, APC, ATM, AXIN2, BAP1, BARD1, BLM, BMPR1A, BRCA1, BRCA2, BRIP1, CASR, CDC73, CDH1, CDK4, CDKN1B, CDKN1C, CDKN2A, CEBPA, CHEK2, CTNNA1, DICER1, DIS3L2, EGFR, EPCAM  . GERD (gastroesophageal reflux disease)   . 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 History of abnormal mammogram   . Hyperlipidemia   . Hypertension   . Irregular heart beat   . Neuromuscular disorder (HCC)    fibromyalgia  . Sleep apnea    cpap some  . SVD (spontaneous vaginal delivery)    x 2  . Tubular adenoma of colon 05/09/12   "innumerable number" of flat polyps    Current Outpatient Medications:  .  ALPRAZolam (XANAX) 0.25 MG tablet, Take 1 tablet (0.25 mg total) by mouth 2 (two) times daily as needed. (Patient taking differently: Take 0.25 mg by mouth 2 (two) times daily as needed for anxiety. ), Disp: 30 tablet, Rfl: 1 .  amLODipine (NORVASC) 5 MG tablet, Take 1 tablet (5 mg total) by mouth daily., Disp: 180 tablet, Rfl: 1 .  atorvastatin (LIPITOR) 40 MG tablet, Take 1 tablet (40 mg total) by mouth daily., Disp: 90 tablet, Rfl: 1 .  benazepril (LOTENSIN) 40 MG  tablet, Take 1 tablet (40 mg total) by mouth daily. (Needs to be seen before next refill), Disp: 30 tablet, Rfl: 0 .  Blood Glucose Monitoring Suppl (LIBERTY BLOOD GLUCOSE MONITOR) DEVI, 1 each by Does not apply route daily. Test 1x per day and prn-dx250.02, Disp: 1 each, Rfl: 0 .  Carboxymethylcellul-Glycerin (LUBRICATING EYE DROPS OP), Place 1 drop into both eyes daily as needed (dry eyes)., Disp: , Rfl:  .  Cholecalciferol (VITAMIN D3) 5000 units CAPS, Take 5,000 Units by mouth daily., Disp: , Rfl:  .  cyclobenzaprine (FLEXERIL) 5 MG tablet, TAKE 1 TABLET BY  MOUTH THREE TIMES DAILY AS NEEDED FOR MUSCLE SPASMS (NEEDS APPOINTMENT), Disp: 30 tablet, Rfl: 0 .  furosemide (LASIX) 40 MG tablet, TAKE 1 BY MOUTH EVERY MORNING AND 1/2 BY MOUTH AT 1 IN THE AFTERNOON (Patient taking differently: Take 20-40 mg by mouth See admin instructions. Take 40 mg in the morning and take 20 to 40 mg in the afternoon (based on swelling)), Disp: 135 tablet, Rfl: 1 .  glucose blood test strip, Test 1x per day and prn- dx 250.02, Disp: 100 each, Rfl: 2 .  lactulose (CHRONULAC) 10 GM/15ML solution, Take 15 mLs (10 g total) by mouth daily., Disp: 236 mL, Rfl: 11 .  meclizine (ANTIVERT) 25 MG tablet, Take 1 tablet (25 mg total) by mouth 3 (three) times daily as needed for dizziness., Disp: 30 tablet, Rfl: 0 .  metFORMIN (GLUCOPHAGE) 500 MG tablet, Take 1 tablet (500 mg total) by mouth 2 (two) times daily with a meal., Disp: 180 tablet, Rfl: 1 .  omeprazole (PRILOSEC) 40 MG capsule, Take 1 capsule (40 mg total) by mouth daily., Disp: 90 capsule, Rfl: 0 .  ondansetron (ZOFRAN ODT) 4 MG disintegrating tablet, Take 1 tablet (4 mg total) by mouth every 8 (eight) hours as needed., Disp: 20 tablet, Rfl: 0 .  ONE TOUCH ULTRA TEST test strip, USE ONE STRIP TO CHECK GLUCOSE ONCE DAILY, Disp: 100 each, Rfl: 1 .  ONETOUCH DELICA LANCETS 68L MISC, USE ONE LANCET TO CHECK GLUCOSE ONCE DAILY AND AS NEEDED, Disp: 100 each, Rfl: 0 .  traMADol (ULTRAM) 50 MG tablet, Take 2 tablets (100 mg total) by mouth 3 (three) times daily. (Patient taking differently: Take 50 mg by mouth 3 (three) times daily as needed for moderate pain. ), Disp: 90 tablet, Rfl: 0 .  UNABLE TO FIND, Place 1 drop under the tongue. Med Name: Hemp Oil, One drop daily under tongue, Disp: , Rfl:  Social History   Socioeconomic History  . Marital status: Married    Spouse name: Not on file  . Number of children: 3  . Years of education: Not on file  . Highest education level: Not on file  Occupational History  . Occupation: retired   Scientific laboratory technician  . Financial resource strain: Not on file  . Food insecurity:    Worry: Not on file    Inability: Not on file  . Transportation needs:    Medical: Not on file    Non-medical: Not on file  Tobacco Use  . Smoking status: Former Smoker    Packs/day: 1.00    Years: 3.00    Pack years: 3.00    Types: Cigarettes    Last attempt to quit: 08/15/1969    Years since quitting: 49.1  . Smokeless tobacco: Never Used  Substance and Sexual Activity  . Alcohol use: No    Alcohol/week: 0.0 standard drinks  . Drug use: No  .  Sexual activity: Not Currently  Lifestyle  . Physical activity:    Days per week: Not on file    Minutes per session: Not on file  . Stress: Not on file  Relationships  . Social connections:    Talks on phone: Not on file    Gets together: Not on file    Attends religious service: Not on file    Active member of club or organization: Not on file    Attends meetings of clubs or organizations: Not on file    Relationship status: Not on file  . Intimate partner violence:    Fear of current or ex partner: Not on file    Emotionally abused: Not on file    Physically abused: Not on file    Forced sexual activity: Not on file  Other Topics Concern  . Not on file  Social History Narrative  . Not on file   Family History  Problem Relation Age of Onset  . Prostate cancer Father        d.89 diagnosed in late-60s  . Colon cancer Father        diagnosed at 17  . Heart failure Mother        d.37  . Heart disease Mother   . Colon polyps Brother        polyposis. Underwent partial colectomy at age 86.  Marland Kitchen Spina bifida Brother        died at birth  . Stomach cancer Paternal Aunt        questionable  . Kidney cancer Cousin        d.78s maternal first-cousin  . Other Other 16       multiple lipomas  . Colon cancer Paternal Uncle        in his 83s  . Heart failure Maternal Grandmother        d.45  . Heart failure Maternal Uncle        d.50  . Colon  polyps Son 31  . Heart attack Son 41  . Leukemia Maternal Aunt   . Esophageal cancer Neg Hx   . Rectal cancer Neg Hx     Objective: Office vital signs reviewed. BP 117/64   Pulse 69   Temp (!) 97 F (36.1 C) (Oral)   Ht 5' 6"  (1.676 m)   Wt 215 lb (97.5 kg)   BMI 34.70 kg/m   Physical Examination:  General: Awake, alert, obese, No acute distress HEENT: Normal, sclera white. Cardio: regular rate and rhythm, S1S2 heard, no murmurs appreciated Pulm: clear to auscultation bilaterally, no wheezes, rhonchi or rales; normal work of breathing on room air Extremities: Trace to +1 pedal edema bilaterally. Neuro:see below Diabetic Foot Exam - Simple   Simple Foot Form Diabetic Foot exam was performed with the following findings:  Yes 10/09/2018 12:43 PM  Visual Inspection See comments:  Yes Sensation Testing Intact to touch and monofilament testing bilaterally:  Yes Pulse Check Posterior Tibialis and Dorsalis pulse intact bilaterally:  Yes Comments Onychomycotic changes throughout bilateral feet and great toenails.  She has reduced pedal pulses bilaterally.  Reduced vibratory sense bilaterally.  Monofilament sensation is grossly intact.    Psych: Mood stable, speech normal, pleasant interactive. Depression screen [facility] 2/9 10/09/2018 02/06/2018 12/26/2017  Decreased Interest 0 0 0  Down, Depressed, Hopeless 0 0 0  PHQ - 2 Score 0 0 0  Altered sleeping 1 - -  Tired, decreased energy 1 - -  Change in appetite  0 - -  Feeling bad or failure about yourself  0 - -  Trouble concentrating 0 - -  Moving slowly or fidgety/restless 0 - -  Suicidal thoughts 0 - -  PHQ-9 Score 2 - -   No flowsheet data found.   Assessment/ Plan: 73 y.o. female   1. Essential hypertension Controlled.  Continue current regimen - amLODipine (NORVASC) 5 MG tablet; Take 1 tablet (5 mg total) by mouth daily.  Dispense: 180 tablet; Refill: 1 - benazepril (LOTENSIN) 40 MG tablet; Take 1 tablet (40 mg total) by  mouth daily.  Dispense: 90 tablet; Refill: 1  2. Mixed hyperlipidemia Continue statin. - atorvastatin (LIPITOR) 40 MG tablet; Take 1 tablet (40 mg total) by mouth daily.  Dispense: 90 tablet; Refill: 1  3. Bilateral leg edema 1+ edema noted today.  Continue lasix as prescribed. - furosemide (LASIX) 40 MG tablet; TAKE 1 BY MOUTH EVERY MORNING AND 1/2 BY MOUTH AT 1 IN THE AFTERNOON  Dispense: 135 tablet; Refill: 1  4. Type 2 diabetes mellitus without complication, without long-term current use of insulin (HCC) Under excellent control with A1c of 5.9.  Continue current regimen.  No changes made.  Foot exam was performed today. - Bayer DCA Hb A1c Waived - metFORMIN (GLUCOPHAGE) 500 MG tablet; Take 1 tablet (500 mg total) by mouth 2 (two) times daily with a meal.  Dispense: 180 tablet; Refill: 1  5. Gastroesophageal reflux disease without esophagitis - omeprazole (PRILOSEC) 40 MG capsule; Take 1 capsule (40 mg total) by mouth daily.  Dispense: 90 capsule; Refill: 1  6. Chronic left-sided low back pain with left-sided sciatica No refills given today.  Medication adjusted to reflect her current use. - traMADol (ULTRAM) 50 MG tablet; Take 1 tablet (50 mg total) by mouth every 12 (twelve) hours as needed for moderate pain.  Dispense: 30 tablet; Refill: 0  7. Thrombocytopenia ([place]) Check CBC  - CBC   Orders Placed This Encounter  Procedures  . Bayer DCA Hb A1c Waived  . CBC   Meds ordered this encounter  Medications  . amLODipine (NORVASC) 5 MG tablet    Sig: Take 1 tablet (5 mg total) by mouth daily.    Dispense:  180 tablet    Refill:  1  . atorvastatin (LIPITOR) 40 MG tablet    Sig: Take 1 tablet (40 mg total) by mouth daily.    Dispense:  90 tablet    Refill:  1  . benazepril (LOTENSIN) 40 MG tablet    Sig: Take 1 tablet (40 mg total) by mouth daily.    Dispense:  90 tablet    Refill:  1  . furosemide (LASIX) 40 MG tablet    Sig: TAKE 1 BY MOUTH EVERY MORNING AND 1/2 BY MOUTH  AT 1 IN THE AFTERNOON    Dispense:  135 tablet    Refill:  1  . metFORMIN (GLUCOPHAGE) 500 MG tablet    Sig: Take 1 tablet (500 mg total) by mouth 2 (two) times daily with a meal.    Dispense:  180 tablet    Refill:  1  . omeprazole (PRILOSEC) 40 MG capsule    Sig: Take 1 capsule (40 mg total) by mouth daily.    Dispense:  90 capsule    Refill:  1  . traMADol (ULTRAM) 50 MG tablet    Sig: Take 1 tablet (50 mg total) by mouth every 12 (twelve) hours as needed for moderate pain.    Dispense:  30 tablet    Refill:  McGill, DO [place] [phone]

## 2018-10-10 ENCOUNTER — Telehealth: Payer: Self-pay | Admitting: Family Medicine

## 2018-10-10 LAB — CBC
Hematocrit: 37.7 % (ref 34.0–46.6)
Hemoglobin: 13.1 g/dL (ref 11.1–15.9)
MCH: 32.2 pg (ref 26.6–33.0)
MCHC: 34.7 g/dL (ref 31.5–35.7)
MCV: 93 fL (ref 79–97)
Platelets: 102 10*3/uL — ABNORMAL LOW (ref 150–450)
RBC: 4.07 x10E6/uL (ref 3.77–5.28)
RDW: 13.3 % (ref 11.7–15.4)
WBC: 4.4 10*3/uL (ref 3.4–10.8)

## 2018-10-10 NOTE — Telephone Encounter (Signed)
Patient wanted to let us know she had seen her results on MyChart.

## 2018-10-10 NOTE — Telephone Encounter (Signed)
Lm due for AWV

## 2018-10-19 DIAGNOSIS — M545 Low back pain: Secondary | ICD-10-CM | POA: Diagnosis not present

## 2018-10-19 DIAGNOSIS — M48061 Spinal stenosis, lumbar region without neurogenic claudication: Secondary | ICD-10-CM | POA: Diagnosis not present

## 2018-10-22 ENCOUNTER — Other Ambulatory Visit: Payer: Self-pay | Admitting: Orthopedic Surgery

## 2018-10-22 ENCOUNTER — Other Ambulatory Visit (HOSPITAL_COMMUNITY): Payer: Self-pay | Admitting: Orthopedic Surgery

## 2018-10-22 DIAGNOSIS — M545 Low back pain, unspecified: Secondary | ICD-10-CM

## 2018-10-26 DIAGNOSIS — M5416 Radiculopathy, lumbar region: Secondary | ICD-10-CM | POA: Diagnosis not present

## 2018-11-02 ENCOUNTER — Telehealth: Payer: Self-pay | Admitting: Family Medicine

## 2018-11-02 DIAGNOSIS — R05 Cough: Secondary | ICD-10-CM | POA: Diagnosis not present

## 2018-11-02 DIAGNOSIS — R6889 Other general symptoms and signs: Secondary | ICD-10-CM

## 2018-11-02 DIAGNOSIS — R509 Fever, unspecified: Secondary | ICD-10-CM | POA: Diagnosis not present

## 2018-11-02 MED ORDER — BENZONATATE 100 MG PO CAPS: 100.0000 mg | ORAL_CAPSULE | Freq: Three times a day (TID) | ORAL | 0 refills | Status: DC | PRN

## 2018-11-02 MED ORDER — OSELTAMIVIR PHOSPHATE 75 MG PO CAPS: 75.0000 mg | ORAL_CAPSULE | Freq: Two times a day (BID) | ORAL | 0 refills | Status: AC

## 2018-11-02 NOTE — Telephone Encounter (Signed)
What symptoms do you have? Bad cough, aches and pain and no fever.  How long have you been sick? Two days  Have you been seen for this problem? Yes  If your provider decides to give you a prescription, which pharmacy would you like for it to be sent to? [facility] in [place]   Patient informed that this information will be sent to the clinical staff for review and that they should receive a follow up call.

## 2018-11-02 NOTE — Telephone Encounter (Signed)
Attempted call. No answer.  VM left.

## 2018-11-02 NOTE — Telephone Encounter (Signed)
Telephone visit  Subjective: CC: cough PCP: Janora Norlander, DO Stacey Summers is a 73 y.o. female calls for telephone consult today. Patient provides verbal consent for consult held via phone.  Location of patient: home Location of provider: [facility] Others present for call: husband, son.  Patient reports onset of low-grade fever, cough, nasal congestion and possible wheezes associated with coughing spells.  Denies any shortness of breath, nausea, vomiting, hemoptysis.  She is tolerating fluids without difficulty.  She is used Mucinex with little improvement in symptoms.  Symptoms onset 2 days ago.  Has been was sick with similar and was actually tested for COVID-19 and was negative per her report.  She feels like her symptoms feel similar to when she was sick with influenza in January.   ROS: Per HPI  Allergies  Allergen Reactions  . Latex     "my hands feels numb"   Past Medical History:  Diagnosis Date  . Allergy    seasonal  . Anxiety   . Arthritis    hands, back   . Cataract    surgery to remove  . Chronic back pain    lower back   . Cirrhosis of liver without ascites ([place])    recent dx 07/11/18  . COPD (chronic obstructive pulmonary disease) (HCC)    no inhaler  . Diabetes mellitus without complication ([place])   . DJD (degenerative joint disease)   . Fibromyalgia   . Genetic testing 03/08/2017   Ms. Korol underwent genetic counseling and testing for hereditary cancer syndromes on 02/21/2017. Her results were negative for mutations in all 83 genes analyzed by [facility]'s 83-gene Multi-Cancers Panel. Genes analyzed include: ALK, APC, ATM, AXIN2, BAP1, BARD1, BLM, BMPR1A, BRCA1, BRCA2, BRIP1, CASR, CDC73, CDH1, CDK4, CDKN1B, CDKN1C, CDKN2A, CEBPA, CHEK2, CTNNA1, DICER1, DIS3L2, EGFR, EPCAM  . GERD (gastroesophageal reflux disease)   . 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 History of abnormal mammogram   . Hyperlipidemia   . Hypertension   . Irregular  heart beat   . Neuromuscular disorder (HCC)    fibromyalgia  . Sleep apnea    cpap some  . SVD (spontaneous vaginal delivery)    x 2  . Tubular adenoma of colon 05/09/12   "innumerable number" of flat polyps    Current Outpatient Medications:  .  ALPRAZolam (XANAX) 0.25 MG tablet, Take 1 tablet (0.25 mg total) by mouth 2 (two) times daily as needed. (Patient taking differently: Take 0.25 mg by mouth 2 (two) times daily as needed for anxiety. ), Disp: 30 tablet, Rfl: 1 .  amLODipine (NORVASC) 5 MG tablet, Take 1 tablet (5 mg total) by mouth daily., Disp: 180 tablet, Rfl: 1 .  atorvastatin (LIPITOR) 40 MG tablet, Take 1 tablet (40 mg total) by mouth daily., Disp: 90 tablet, Rfl: 1 .  benazepril (LOTENSIN) 40 MG tablet, Take 1 tablet (40 mg total) by mouth daily., Disp: 90 tablet, Rfl: 1 .  Blood Glucose Monitoring Suppl (LIBERTY BLOOD GLUCOSE MONITOR) DEVI, 1 each by Does not apply route daily. Test 1x per day and prn-dx250.02, Disp: 1 each, Rfl: 0 .  Carboxymethylcellul-Glycerin (LUBRICATING EYE DROPS OP), Place 1 drop into both eyes daily as needed (dry eyes)., Disp: , Rfl:  .  Cholecalciferol (VITAMIN D3) 5000 units CAPS, Take 5,000 Units by mouth daily., Disp: , Rfl:  .  cyclobenzaprine (FLEXERIL) 5 MG tablet, TAKE 1 TABLET BY MOUTH THREE TIMES DAILY AS NEEDED FOR MUSCLE SPASMS (NEEDS  APPOINTMENT), Disp: 30 tablet, Rfl: 0 .  furosemide (LASIX) 40 MG tablet, TAKE 1 BY MOUTH EVERY MORNING AND 1/2 BY MOUTH AT 1 IN THE AFTERNOON, Disp: 135 tablet, Rfl: 1 .  glucose blood test strip, Test 1x per day and prn- dx 250.02, Disp: 100 each, Rfl: 2 .  lactulose (CHRONULAC) 10 GM/15ML solution, Take 15 mLs (10 g total) by mouth daily., Disp: 236 mL, Rfl: 11 .  meclizine (ANTIVERT) 25 MG tablet, Take 1 tablet (25 mg total) by mouth 3 (three) times daily as needed for dizziness., Disp: 30 tablet, Rfl: 0 .  metFORMIN (GLUCOPHAGE) 500 MG tablet, Take 1 tablet (500 mg total) by mouth 2 (two) times daily with  a meal., Disp: 180 tablet, Rfl: 1 .  omeprazole (PRILOSEC) 40 MG capsule, Take 1 capsule (40 mg total) by mouth daily., Disp: 90 capsule, Rfl: 1 .  ondansetron (ZOFRAN ODT) 4 MG disintegrating tablet, Take 1 tablet (4 mg total) by mouth every 8 (eight) hours as needed., Disp: 20 tablet, Rfl: 0 .  ONE TOUCH ULTRA TEST test strip, USE ONE STRIP TO CHECK GLUCOSE ONCE DAILY, Disp: 100 each, Rfl: 1 .  ONETOUCH DELICA LANCETS 46E MISC, USE ONE LANCET TO CHECK GLUCOSE ONCE DAILY AND AS NEEDED, Disp: 100 each, Rfl: 0 .  traMADol (ULTRAM) 50 MG tablet, Take 1 tablet (50 mg total) by mouth every 12 (twelve) hours as needed for moderate pain., Disp: 30 tablet, Rfl: 0 .  UNABLE TO FIND, Place 1 drop under the tongue. Med Name: Hemp Oil, One drop daily under tongue, Disp: , Rfl:   Assessment/ Plan: 73 y.o. female   1. Flu-like symptoms Patient with flu a in January.  She has had possible exposure to influenza from family members.  No known COVID-19 exposure.  Start Tamiflu p.o. twice daily for 5 days.  Tessalon Perles sent for cough.  Reasons for emergent evaluation emergency department discussed.  She voiced good understanding follow-up prn. - oseltamivir (TAMIFLU) 75 MG capsule; Take 1 capsule (75 mg total) by mouth 2 (two) times daily for 5 days.  Dispense: 10 capsule; Refill: 0 - benzonatate (TESSALON PERLES) 100 MG capsule; Take 1 capsule (100 mg total) by mouth 3 (three) times daily as needed.  Dispense: 20 capsule; Refill: 0  Start time: 1:06pm End time: 1:11pm    Janora Norlander, DO [place] [phone]

## 2018-11-02 NOTE — Telephone Encounter (Signed)
Aware to do a e-visit

## 2018-11-05 NOTE — Telephone Encounter (Signed)
Taken care of in different encounter.  This encounter will now be closed

## 2018-11-22 DIAGNOSIS — M5416 Radiculopathy, lumbar region: Secondary | ICD-10-CM | POA: Diagnosis not present

## 2018-11-22 DIAGNOSIS — M48061 Spinal stenosis, lumbar region without neurogenic claudication: Secondary | ICD-10-CM | POA: Diagnosis not present

## 2018-11-22 DIAGNOSIS — E2839 Other primary ovarian failure: Secondary | ICD-10-CM | POA: Diagnosis not present

## 2018-12-04 DIAGNOSIS — M5136 Other intervertebral disc degeneration, lumbar region: Secondary | ICD-10-CM | POA: Diagnosis not present

## 2018-12-05 DIAGNOSIS — G4733 Obstructive sleep apnea (adult) (pediatric): Secondary | ICD-10-CM | POA: Diagnosis not present

## 2018-12-19 DIAGNOSIS — M5136 Other intervertebral disc degeneration, lumbar region: Secondary | ICD-10-CM | POA: Diagnosis not present

## 2018-12-24 ENCOUNTER — Telehealth: Payer: PPO | Admitting: Cardiovascular Disease

## 2018-12-24 ENCOUNTER — Telehealth: Payer: Self-pay | Admitting: Cardiovascular Disease

## 2018-12-24 ENCOUNTER — Other Ambulatory Visit: Payer: Self-pay

## 2018-12-24 NOTE — Telephone Encounter (Signed)
LMTCB - Per Dr C.  Patient needs to r/s appointment in July/dc

## 2018-12-27 ENCOUNTER — Ambulatory Visit: Payer: PPO | Admitting: Cardiovascular Disease

## 2018-12-27 NOTE — Telephone Encounter (Signed)
Follow Up:   Pt was called on 12-24-18 to reschedule her appt. Pt says she is having some problems with palpitations. Pt was put on Hao's schedule for tomorrow. Please call to evaluate.

## 2018-12-27 NOTE — Telephone Encounter (Signed)
Lm to call back ./cy 

## 2018-12-27 NOTE — Telephone Encounter (Signed)
Spoke with pt and has noted increased frequency in heart skipping and chest " feeling funny" no other symptoms and chest is fine today Pt has appt with Almyra Deforest PA tom at 11:30 am Pt instructed if S/S worsen to go to ED otherwise will have virtual visit tomorrow .Adonis Housekeeper

## 2018-12-27 NOTE — Telephone Encounter (Signed)
Follow up  ° ° °Pt is returning call  ° ° °Please call back  °

## 2018-12-28 ENCOUNTER — Encounter: Payer: Self-pay | Admitting: Physician Assistant

## 2018-12-28 ENCOUNTER — Telehealth (INDEPENDENT_AMBULATORY_CARE_PROVIDER_SITE_OTHER): Payer: PPO | Admitting: Physician Assistant

## 2018-12-28 ENCOUNTER — Telehealth: Payer: Self-pay

## 2018-12-28 DIAGNOSIS — E785 Hyperlipidemia, unspecified: Secondary | ICD-10-CM

## 2018-12-28 DIAGNOSIS — I10 Essential (primary) hypertension: Secondary | ICD-10-CM

## 2018-12-28 DIAGNOSIS — R002 Palpitations: Secondary | ICD-10-CM

## 2018-12-28 DIAGNOSIS — G4733 Obstructive sleep apnea (adult) (pediatric): Secondary | ICD-10-CM

## 2018-12-28 MED ORDER — DILTIAZEM HCL ER COATED BEADS 120 MG PO CP24: 120.0000 mg | ORAL_CAPSULE | Freq: Every day | ORAL | 1 refills | Status: DC

## 2018-12-28 NOTE — Progress Notes (Signed)
Virtual Visit via Telephone Note   This visit type was conducted due to national recommendations for restrictions regarding the COVID-19 Pandemic (e.g. social distancing) in an effort to limit this patient's exposure and mitigate transmission in our community.  Due to her co-morbid illnesses, this patient is at least at moderate risk for complications without adequate follow up.  This format is felt to be most appropriate for this patient at this time.  The patient did not have access to video technology/had technical difficulties with video requiring transitioning to audio format only (telephone).  All issues noted in this document were discussed and addressed.  No physical exam could be performed with this format.  Please refer to the patient's chart for her  consent to telehealth for [facility].   Date:  12/28/2018   ID:  Stacey Summers, DOB [birth year], MRN [id number]  Patient Location: Home Provider Location: Home  PCP:  Stacey Norlander, DO  Cardiologist:  Sanda Klein, MD  Electrophysiologist:  None   Evaluation Performed:  Follow-Up Visit  Chief Complaint:  followup  History of Present Illness:    Stacey Summers is a 73 y.o. female with past medical history of morbid obesity, obstructive sleep apnea, hypertension, hyperlipidemia and leg edema.  Last echocardiogram obtained on 03/04/2013 showed EF 55 to 60%, mild LAE.  Previous EKG demonstrated very frequent PVCs however she had no cardiac awareness at the time.  She also had some dyspnea and was previously evaluated by pulmonology service who felt her dyspnea was multifactorial related to restrictive ventilatory defect from obesity, deconditioning, inconsistent use of sleep apnea treatment as well as cardiac factors.  Her last follow-up with Dr. Sallyanne Kuster was on 01/20/2016.  Patient was unfortunately diagnosed with possible cirrhosis in November 2019.  This is being followed by GI.  She contact cardiology today for  evaluation of palpitation.  This is done through telephone visit.  She says this is the same palpitation she had in the past however more frequently.  She is using her CPAP therapy greater than 50% of time however is not 100% compliant.  She denies significant chest pain or shortness of breath with exertion.  She does not do much exercise due to her significant knee pain for which she is planning to see a orthopedic doctor.  I recommended discontinuation of amlodipine and a switch to diltiazem 120 mg daily to help control her frequent PVCs.  She has prior history of significant fatigue and intolerance to beta-blocker.  Although ideally, propranolol is probably a better medication for her given her history of cirrhosis, however I doubt she can tolerate it at this time.  I will obtain CBC, CMP and TSH in 3 weeks and she can follow-up with Dr. Sallyanne Kuster in 4 weeks.  The patient does not have symptoms concerning for COVID-19 infection (fever, chills, cough, or new shortness of breath).    Past Medical History:  Diagnosis Date  . Allergy    seasonal  . Anxiety   . Arthritis    hands, back   . Cataract    surgery to remove  . Chronic back pain    lower back   . Cirrhosis of liver without ascites ([place])    recent dx 07/11/18  . COPD (chronic obstructive pulmonary disease) (HCC)    no inhaler  . Diabetes mellitus without complication ([place])   . DJD (degenerative joint disease)   . Fibromyalgia   . Genetic testing 03/08/2017   [name] underwent genetic  counseling and testing for hereditary cancer syndromes on 02/21/2017. Her results were negative for mutations in all 83 genes analyzed by [facility]'s 83-gene Multi-Cancers Panel. Genes analyzed include: ALK, APC, ATM, AXIN2, BAP1, BARD1, BLM, BMPR1A, BRCA1, BRCA2, BRIP1, CASR, CDC73, CDH1, CDK4, CDKN1B, CDKN1C, CDKN2A, CEBPA, CHEK2, CTNNA1, DICER1, DIS3L2, EGFR, EPCAM  . GERD (gastroesophageal reflux disease)   . Hearing loss    bilateral - no  hearing aids  . History of abnormal mammogram   . Hyperlipidemia   . Hypertension   . Irregular heart beat   . Neuromuscular disorder (HCC)    fibromyalgia  . Sleep apnea    cpap some  . SVD (spontaneous vaginal delivery)    x 2  . Tubular adenoma of colon 05/09/12   "innumerable number" of flat polyps   Past Surgical History:  Procedure Laterality Date  . CARPAL TUNNEL RELEASE Left 10/20/14   [facility]  . CATARACT EXTRACTION, BILATERAL    . [place]  . CHOLECYSTECTOMY  1990  . COLONOSCOPY    . KNEE ARTHROSCOPY WITH SUBCHONDROPLASTY Right 03/27/2018   Procedure: Right knee arthroscopic partial medial meniscus repair versus menisectomy with medial tibia subchondroplasty;  Surgeon: Nicholes Stairs, MD;  Location: [facility];  Service: Orthopedics;  Laterality: Right;  120 mins  . LARYNX SURGERY     POLYP REMOVAL  . MULTIPLE TOOTH EXTRACTIONS    . POLYPECTOMY     vocal cord  . TONSILLECTOMY  1965  . TUBAL LIGATION    . TUMOR REMOVAL     benign fatty tumors on abdomen  . UPPER GASTROINTESTINAL ENDOSCOPY       Current Meds  Medication Sig  . amLODipine (NORVASC) 5 MG tablet Take 1 tablet (5 mg total) by mouth daily.  Marland Kitchen atorvastatin (LIPITOR) 40 MG tablet Take 1 tablet (40 mg total) by mouth daily.  . benazepril (LOTENSIN) 40 MG tablet Take 1 tablet (40 mg total) by mouth daily.  . Blood Glucose Monitoring Suppl (LIBERTY BLOOD GLUCOSE MONITOR) DEVI 1 each by Does not apply route daily. Test 1x per day and prn-dx250.02  . Carboxymethylcellul-Glycerin (LUBRICATING EYE DROPS OP) Place 1 drop into both eyes daily as needed (dry eyes).  . Cholecalciferol (VITAMIN D3) 5000 units CAPS Take 5,000 Units by mouth daily.  . cyclobenzaprine (FLEXERIL) 5 MG tablet TAKE 1 TABLET BY MOUTH THREE TIMES DAILY AS NEEDED FOR MUSCLE SPASMS (NEEDS APPOINTMENT)  . furosemide (LASIX) 40 MG tablet TAKE 1 BY MOUTH EVERY MORNING AND 1/2 BY MOUTH AT 1 IN THE AFTERNOON  . glucose blood test  strip Test 1x per day and prn- dx 250.02  . meclizine (ANTIVERT) 25 MG tablet Take 1 tablet (25 mg total) by mouth 3 (three) times daily as needed for dizziness.  . metFORMIN (GLUCOPHAGE) 500 MG tablet Take 1 tablet (500 mg total) by mouth 2 (two) times daily with a meal. (Patient taking differently: Take 500 mg by mouth daily. )  . omeprazole (PRILOSEC) 40 MG capsule Take 1 capsule (40 mg total) by mouth daily.  . ONE TOUCH ULTRA TEST test strip USE ONE STRIP TO CHECK GLUCOSE ONCE DAILY  . ONETOUCH DELICA LANCETS 92Z MISC USE ONE LANCET TO CHECK GLUCOSE ONCE DAILY AND AS NEEDED  . UNABLE TO FIND Place 1 drop under the tongue. Med Name: Hemp Oil, One drop daily under tongue     Allergies:   Latex   Social History   Tobacco Use  . Smoking status: Former Smoker  Packs/day: 1.00    Years: 3.00    Pack years: 3.00    Types: Cigarettes    Last attempt to quit: 08/15/1969    Years since quitting: 49.4  . Smokeless tobacco: Never Used  Substance Use Topics  . Alcohol use: No    Alcohol/week: 0.0 standard drinks  . Drug use: No     Family Hx: The patient's family history includes Colon cancer in her father and paternal uncle; Colon polyps in her brother; Colon polyps (age of onset: 12) in her son; Heart attack (age of onset: 66) in her son; Heart disease in her mother; Heart failure in her maternal grandmother, maternal uncle, and mother; Kidney cancer in her cousin; Leukemia in her maternal aunt; Other (age of onset: 29) in an other family member; Prostate cancer in her father; Spina bifida in her brother; Stomach cancer in her paternal aunt. There is no history of Esophageal cancer or Rectal cancer.  ROS:   Please see the history of present illness.     All other systems reviewed and are negative.   Prior CV studies:   The following studies were reviewed today:  Echo 03/04/2013 LV EF: 55% -  60% Study Conclusions  - Left ventricle: The cavity size was normal. Systolic  function was normal. The estimated ejection fraction was in the range of 55% to 60%. Wall motion was normal; there were no regional wall motion abnormalities. Left ventricular diastolic function parameters were normal. - Left atrium: The atrium was mildly dilated.  Labs/Other Tests and Data Reviewed:    EKG:  An ECG dated 02/05/2018 was personally reviewed today and demonstrated:  Normal sinus rhythm with occasional PVC.  Recent Labs: 07/11/2018: ALT 23; BUN 16; Creatinine, Ser 0.80; Potassium 4.0; Sodium 142 10/09/2018: Hemoglobin 13.1; Platelets 102   Recent Lipid Panel Lab Results  Component Value Date/Time   CHOL 103 02/06/2018 03:22 PM   CHOL 162 12/20/2012 10:44 AM   TRIG 83 02/06/2018 03:22 PM   TRIG 91 12/25/2014 10:20 AM   TRIG 83 12/20/2012 10:44 AM   HDL 32 (L) 02/06/2018 03:22 PM   HDL 42 12/25/2014 10:20 AM   HDL 42 12/20/2012 10:44 AM   CHOLHDL 3.2 02/06/2018 03:22 PM   LDLCALC 54 02/06/2018 03:22 PM   LDLCALC 59 01/29/2014 10:31 AM   LDLCALC 103 (H) 12/20/2012 10:44 AM    Wt Readings from Last 3 Encounters:  10/09/18 215 lb (97.5 kg)  08/29/18 218 lb (98.9 kg)  08/07/18 217 lb (98.4 kg)     Objective:    Vital Signs:  There were no vitals taken for this visit.   VITAL SIGNS:  reviewed  ASSESSMENT & PLAN:    1. Palpitation: She is not 100% compliant with CPAP machine which likely contributed to palpitation.  I urged her to be more compliant with CPAP therapy.  I have discontinued her amlodipine and switched her to low-dose diltiazem 120 mg daily.  Standard lab work in 3 weeks to rule out secondary causes for palpitation.  CBC, basic metabolic panel and TSH.  2. Hypertension: She did not provide any vital signs today.  You order to help control the palpitation.  I have discontinued amlodipine in order to create enough blood pressure room to add diltiazem 120 mg daily.  3. Hyperlipidemia: On Lipitor 40 mg daily.  4. Obstructive sleep apnea: She is  compliant with CPAP therapy only greater than 50% of the time however she does admit she does not use  CPAP on a nightly basis.  I urged her to be more compliant.   COVID-19 Education: The signs and symptoms of COVID-19 were discussed with the patient and how to seek care for testing (follow up with PCP or arrange E-visit).  The importance of social distancing was discussed today.  Time:   Today, I have spent 19 minutes with the patient with telehealth technology discussing the above problems.     Medication Adjustments/Labs and Tests Ordered: Current medicines are reviewed at length with the patient today.  Concerns regarding medicines are outlined above.   Tests Ordered: No orders of the defined types were placed in this encounter.   Medication Changes: No orders of the defined types were placed in this encounter.   Disposition:  Follow up in 4 week(s)  Signed, Almyra Deforest, PA  12/28/2018 11:51 AM    [place]

## 2018-12-28 NOTE — Telephone Encounter (Signed)
Virtual Visit Pre-Appointment Phone Call  "(Name), I am calling you today to discuss your upcoming appointment. We are currently trying to limit exposure to the virus that causes COVID-19 by seeing patients at home rather than in the office."  1. "What is the BEST phone number to call the day of the visit?" - include this in appointment notes  2. "Do you have or have access to (through a family member/friend) a smartphone with video capability that we can use for your visit?" a. If yes - list this number in appt notes as "cell" (if different from BEST phone #) and list the appointment type as a VIDEO visit in appointment notes b. If no - list the appointment type as a PHONE visit in appointment notes  3. Confirm consent - "In the setting of the current Covid19 crisis, you are scheduled for a PHONE visit with your provider on 12/28/2018 at 11:30AM.  Just as we do with many in-office visits, in order for you to participate in this visit, we must obtain consent.  If you'd like, I can send this to your mychart (if signed up) or email for you to review.  Otherwise, I can obtain your verbal consent now.  All virtual visits are billed to your insurance company just like a normal visit would be.  By agreeing to a virtual visit, we'd like you to understand that the technology does not allow for your provider to perform an examination, and thus may limit your provider's ability to fully assess your condition. If your provider identifies any concerns that need to be evaluated in person, we will make arrangements to do so.  Finally, though the technology is pretty good, we cannot assure that it will always work on either your or our end, and in the setting of a video visit, we may have to convert it to a phone-only visit.  In either situation, we cannot ensure that we have a secure connection.  Are you willing to proceed?" STAFF: Did the patient verbally acknowledge consent to telehealth visit? Document YES/NO  here: YES  4. Advise patient to be prepared - "Two hours prior to your appointment, go ahead and check your blood pressure, pulse, oxygen saturation, and your weight (if you have the equipment to check those) and write them all down. When your visit starts, your provider will ask you for this information. If you have an Apple Watch or Kardia device, please plan to have heart rate information ready on the day of your appointment. Please have a pen and paper handy nearby the day of the visit as well."  5. Give patient instructions for MyChart download to smartphone OR Doximity/Doxy.me as below if video visit (depending on what platform provider is using)  6. Inform patient they will receive a phone call 15 minutes prior to their appointment time (may be from unknown caller ID) so they should be prepared to answer    Stacey Summers has been deemed a candidate for a follow-up tele-health visit to limit community exposure during the Covid-19 pandemic. I spoke with the patient via phone to ensure availability of phone/video source, confirm preferred email & phone number, and discuss instructions and expectations.  I reminded Stacey Summers to be prepared with any vital sign and/or heart rhythm information that could potentially be obtained via home monitoring, at the time of her visit. I reminded Stacey Summers to expect a phone call prior to her visit.  Stacey Summers, [place] 12/28/2018 11:44 AM   INSTRUCTIONS FOR DOWNLOADING THE MYCHART APP TO SMARTPHONE  - The patient must first make sure to have activated MyChart and know their login information - If Apple, go to [employer] and type in MyChart in the search bar and download the app. If Android, ask patient to go to [employer] and type in [place] in the search bar and download the app. The app is free but as with any other app downloads, their phone may require them to verify saved payment information or  Apple/Android password.  - The patient will need to then log into the app with their MyChart username and password, and select [facility] as their healthcare provider to link the account. When it is time for your visit, go to the MyChart app, find appointments, and click Begin Video Visit. Be sure to Select Allow for your device to access the Microphone and Camera for your visit. You will then be connected, and your provider will be with you shortly.  **If they have any issues connecting, or need assistance please contact MyChart service desk [phone])**  **If using a computer, in order to ensure the best quality for their visit they will need to use either of the following Internet Browsers: [employer], or Google Chrome**  IF USING DOXIMITY or DOXY.ME - The patient will receive a link just prior to their visit by text.     FULL LENGTH CONSENT FOR TELE-HEALTH VISIT   I hereby voluntarily request, consent and authorize [facility] and its employed or contracted physicians, physician assistants, nurse practitioners or other licensed health care professionals (the Practitioner), to provide me with telemedicine health care services (the "Services") as deemed necessary by the treating Practitioner. I acknowledge and consent to receive the Services by the Practitioner via telemedicine. I understand that the telemedicine visit will involve communicating with the Practitioner through live audiovisual communication technology and the disclosure of certain medical information by electronic transmission. I acknowledge that I have been given the opportunity to request an in-person assessment or other available alternative prior to the telemedicine visit and am voluntarily participating in the telemedicine visit.  I understand that I have the right to withhold or withdraw my consent to the use of telemedicine in the course of my care at any time, without affecting my right to future care  or treatment, and that the Practitioner or I may terminate the telemedicine visit at any time. I understand that I have the right to inspect all information obtained and/or recorded in the course of the telemedicine visit and may receive copies of available information for a reasonable fee.  I understand that some of the potential risks of receiving the Services via telemedicine include:  Marland Kitchen Delay or interruption in medical evaluation due to technological equipment failure or disruption; . Information transmitted may not be sufficient (e.g. poor resolution of images) to allow for appropriate medical decision making by the Practitioner; and/or  . In rare instances, security protocols could fail, causing a breach of personal health information.  Furthermore, I acknowledge that it is my responsibility to provide information about my medical history, conditions and care that is complete and accurate to the best of my ability. I acknowledge that Practitioner's advice, recommendations, and/or decision may be based on factors not within their control, such as incomplete or inaccurate data provided by me or distortions of diagnostic images or specimens that may result from electronic transmissions. I understand that the  practice of medicine is not an Chief Strategy Officer and that Practitioner makes no warranties or guarantees regarding treatment outcomes. I acknowledge that I will receive a copy of this consent concurrently upon execution via email to the email address I last provided but may also request a printed copy by calling the office of [facility].    I understand that my insurance will be billed for this visit.   I have read or had this consent read to me. . I understand the contents of this consent, which adequately explains the benefits and risks of the Services being provided via telemedicine.  . I have been provided ample opportunity to ask questions regarding this consent and the Services and have had  my questions answered to my satisfaction. . I give my informed consent for the services to be provided through the use of telemedicine in my medical care  By participating in this telemedicine visit I agree to the above.

## 2018-12-28 NOTE — Patient Instructions (Addendum)
Medication Instructions:   STOP AMLODIPINE START DILTIAZEM 120 mg daily If you need a refill on your cardiac medications before your next appointment, please call your pharmacy.   Lab work:  You will need to come into the office in 3 weeks to have labs (blood work) drawn:  CBC CMP TSH  If you have labs (blood work) drawn today and your tests are completely normal, you will receive your results only by: Marland Kitchen MyChart Message (if you have MyChart) OR . A paper copy in the mail If you have any lab test that is abnormal or we need to change your treatment, we will call you to review the results.  Testing/Procedures:  NONE ordered at this time of appointment   Follow-Up: At [facility], you and your health needs are our priority.  As part of our continuing mission to provide you with exceptional heart care, we have created designated Provider Care Teams.  These Care Teams include your primary Cardiologist (physician) and Advanced Practice Providers (APPs -  Physician Assistants and Nurse Practitioners) who all work together to provide you with the care you need, when you need it. You will need a follow up appointment in 4 weeks.  Please call our office 2 months in advance to schedule this appointment.  You may see Sanda Klein, MD or one of the following Advanced Practice Providers on your designated Care Team: [place], [place] . Fabian Sharp, PA-C  Any Other Special Instructions Will Be Listed Below (If Applicable).

## 2019-01-01 DIAGNOSIS — R002 Palpitations: Secondary | ICD-10-CM | POA: Diagnosis not present

## 2019-01-02 LAB — COMPREHENSIVE METABOLIC PANEL
ALT: 34 IU/L — ABNORMAL HIGH (ref 0–32)
AST: 49 IU/L — ABNORMAL HIGH (ref 0–40)
Albumin/Globulin Ratio: 1.7 (ref 1.2–2.2)
Albumin: 3.6 g/dL — ABNORMAL LOW (ref 3.8–4.8)
Alkaline Phosphatase: 95 IU/L (ref 39–117)
BUN/Creatinine Ratio: 20 (ref 12–28)
BUN: 15 mg/dL (ref 8–27)
Bilirubin Total: 2.2 mg/dL — ABNORMAL HIGH (ref 0.0–1.2)
CO2: 26 mmol/L (ref 20–29)
Calcium: 9.1 mg/dL (ref 8.7–10.3)
Chloride: 106 mmol/L (ref 96–106)
Creatinine, Ser: 0.76 mg/dL (ref 0.57–1.00)
GFR calc Af Amer: 92 mL/min/{1.73_m2} (ref >59)
GFR calc non Af Amer: 80 mL/min/{1.73_m2} (ref >59)
Globulin, Total: 2.1 g/dL (ref 1.5–4.5)
Glucose: 132 mg/dL — ABNORMAL HIGH (ref 65–99)
Potassium: 4.4 mmol/L (ref 3.5–5.2)
Sodium: 144 mmol/L (ref 134–144)
Total Protein: 5.7 g/dL — ABNORMAL LOW (ref 6.0–8.5)

## 2019-01-02 LAB — CBC
Hematocrit: 38.2 % (ref 34.0–46.6)
Hemoglobin: 13 g/dL (ref 11.1–15.9)
MCH: 31.8 pg (ref 26.6–33.0)
MCHC: 34 g/dL (ref 31.5–35.7)
MCV: 93 fL (ref 79–97)
Platelets: 103 10*3/uL — ABNORMAL LOW (ref 150–450)
RBC: 4.09 x10E6/uL (ref 3.77–5.28)
RDW: 12.7 % (ref 11.7–15.4)
WBC: 4.2 10*3/uL (ref 3.4–10.8)

## 2019-01-02 LAB — TSH: TSH: 4.09 u[IU]/mL (ref 0.450–4.500)

## 2019-01-03 NOTE — Progress Notes (Signed)
The patient has been notified of the result and verbalized understanding.  She wants to know why her platelets are less now than what it was 5 months ago Stacey Summers, [facility] 01/03/2019 9:26 AM

## 2019-01-15 DIAGNOSIS — E559 Vitamin D deficiency, unspecified: Secondary | ICD-10-CM | POA: Diagnosis not present

## 2019-01-15 DIAGNOSIS — E119 Type 2 diabetes mellitus without complications: Secondary | ICD-10-CM | POA: Diagnosis not present

## 2019-01-15 DIAGNOSIS — S82144S Nondisplaced bicondylar fracture of right tibia, sequela: Secondary | ICD-10-CM | POA: Diagnosis not present

## 2019-01-15 DIAGNOSIS — M8580 Other specified disorders of bone density and structure, unspecified site: Secondary | ICD-10-CM | POA: Diagnosis not present

## 2019-01-15 DIAGNOSIS — M5136 Other intervertebral disc degeneration, lumbar region: Secondary | ICD-10-CM | POA: Diagnosis not present

## 2019-01-15 DIAGNOSIS — M797 Fibromyalgia: Secondary | ICD-10-CM | POA: Diagnosis not present

## 2019-01-21 ENCOUNTER — Other Ambulatory Visit: Payer: Self-pay

## 2019-01-21 ENCOUNTER — Telehealth: Payer: Self-pay | Admitting: Cardiovascular Disease

## 2019-01-21 ENCOUNTER — Ambulatory Visit (INDEPENDENT_AMBULATORY_CARE_PROVIDER_SITE_OTHER): Payer: PPO | Admitting: *Deleted

## 2019-01-21 DIAGNOSIS — Z0000 Encounter for general adult medical examination without abnormal findings: Secondary | ICD-10-CM

## 2019-01-21 NOTE — Patient Instructions (Signed)
Preventive Care 71 Years and Older, Female Preventive care refers to lifestyle choices and visits with your health care provider that can promote health and wellness. What does preventive care include?  A yearly physical exam. This is also called an annual well check.  Dental exams once or twice a year.  Routine eye exams. Ask your health care provider how often you should have your eyes checked.  Personal lifestyle choices, including: ? Daily care of your teeth and gums. ? Regular physical activity. ? Eating a healthy diet. ? Avoiding tobacco and drug use. ? Limiting alcohol use. ? Practicing safe sex. ? Taking low-dose aspirin every day. ? Taking vitamin and mineral supplements as recommended by your health care provider. What happens during an annual well check? The services and screenings done by your health care provider during your annual well check will depend on your age, overall health, lifestyle risk factors, and family history of disease. Counseling Your health care provider may ask you questions about your:  Alcohol use.  Tobacco use.  Drug use.  Emotional well-being.  Home and relationship well-being.  Sexual activity.  Eating habits.  History of falls.  Memory and ability to understand (cognition).  Work and work Statistician.  Reproductive health.  Screening You may have the following tests or measurements:  Height, weight, and BMI.  Blood pressure.  Lipid and cholesterol levels. These may be checked every 5 years, or more frequently if you are over 71 years old.  Skin check.  Lung cancer screening. You may have this screening every year starting at age 71 if you have a 30-pack-year history of smoking and currently smoke or have quit within the past 15 years.  Colorectal cancer screening. All adults should have this screening starting at age 71 and continuing until age 71. You will have tests every 1-10 years, depending on your results and the  type of screening test. People at increased risk should start screening at an earlier age. Screening tests may include: ? Guaiac-based fecal occult blood testing. ? Fecal immunochemical test (FIT). ? Stool DNA test. ? Virtual colonoscopy. ? Sigmoidoscopy. During this test, a flexible tube with a tiny camera (sigmoidoscope) is used to examine your rectum and lower colon. The sigmoidoscope is inserted through your anus into your rectum and lower colon. ? Colonoscopy. During this test, a long, thin, flexible tube with a tiny camera (colonoscope) is used to examine your entire colon and rectum.  Hepatitis C blood test.  Hepatitis B blood test.  Sexually transmitted disease (STD) testing.  Diabetes screening. This is done by checking your blood sugar (glucose) after you have not eaten for a while (fasting). You may have this done every 1-3 years.  Bone density scan. This is done to screen for osteoporosis. You may have this done starting at age 71.  Mammogram. This may be done every 1-2 years. Talk to your health care provider about how often you should have regular mammograms. Talk with your health care provider about your test results, treatment options, and if necessary, the need for more tests. Vaccines Your health care provider may recommend certain vaccines, such as:  Influenza vaccine. This is recommended every year.  Tetanus, diphtheria, and acellular pertussis (Tdap, Td) vaccine. You may need a Td booster every 10 years.  Varicella vaccine. You may need this if you have not been vaccinated.  Zoster vaccine. You may need this after age 71.  Measles, mumps, and rubella (MMR) vaccine. You may need at least  one dose of MMR if you were born in 1957 or later. You may also need a second dose.  Pneumococcal 13-valent conjugate (PCV13) vaccine. One dose is recommended after age 24.  Pneumococcal polysaccharide (PPSV23) vaccine. One dose is recommended after age 24.  Meningococcal  vaccine. You may need this if you have certain conditions.  Hepatitis A vaccine. You may need this if you have certain conditions or if you travel or work in places where you may be exposed to hepatitis A.  Hepatitis B vaccine. You may need this if you have certain conditions or if you travel or work in places where you may be exposed to hepatitis B.  Haemophilus influenzae type b (Hib) vaccine. You may need this if you have certain conditions. Talk to your health care provider about which screenings and vaccines you need and how often you need them. This information is not intended to replace advice given to you by your health care provider. Make sure you discuss any questions you have with your health care provider. Document Released: 08/28/2015 Document Revised: 09/21/2017 Document Reviewed: 06/02/2015 Elsevier Interactive Patient Education  2019 [employer].

## 2019-01-21 NOTE — Telephone Encounter (Signed)
Phone visit only, pre-reg complete, verbal consent given 01/21/2019 MS

## 2019-01-21 NOTE — Progress Notes (Signed)
MEDICARE ANNUAL WELLNESS VISIT  01/21/2019  Telephone Visit Disclaimer This Medicare AWV was conducted by telephone due to national recommendations for restrictions regarding the COVID-19 Pandemic (e.g. social distancing).  I verified, using two identifiers, that I am speaking with Stacey Summers or their authorized healthcare agent. I discussed the limitations, risks, security, and privacy concerns of performing an evaluation and management service by telephone and the potential availability of an in-person appointment in the future. The patient expressed understanding and agreed to proceed.   Subjective:  Stacey Summers is a 73 y.o. female patient of Stacey Norlander, DO who had a Medicare Annual Wellness Visit today via telephone. Stacey Summers is Retired and lives with their spouse. she has 3 children. she reports that she is socially active and does interact with friends/family regularly. she is not physically active and enjoys working with women and youth from [place].  Patient Care Team: Stacey Norlander, DO as PCP - General (Family Medicine) Stacey Klein, MD as PCP - Cardiology (Cardiology) Stacey Space, MD as Consulting Physician (Pulmonary Disease) Stacey Pole, MD as Consulting Physician (Gastroenterology) Stacey Labs, MD as Referring Physician (Optometry)  Advanced Directives 01/21/2019 07/04/2018 06/05/2018 04/20/2018 03/22/2018 11/17/2016 07/18/2014  Does Patient Have a Medical Advance Directive? No No No No No No No  Would patient like information on creating a medical advance directive? No - Patient declined No - Patient declined No - Patient declined No - Patient declined No - Patient declined - Yes - Educational materials given    Hospital Utilization Over the Past 12 Months: # of hospitalizations or ER visits: 0 # of surgeries: 1  Review of Systems    Patient reports that her overall health is worse compared to last year.  Patient Reported Readings  (BP, Pulse, CBG, Weight, etc) none  Review of Systems: Knee pain and back pain which is Chronic  All other systems negative.  Pain Assessment Pain : 0-10 Pain Score: 6  Pain Type: Chronic pain Pain Location: Back Pain Descriptors / Indicators: Constant Pain Onset: More than a month ago Pain Frequency: Constant Pain Relieving Factors: Tramadol Effect of Pain on Daily Activities: can still do activities but it takes longer and she has to take breaks  Pain Relieving Factors: Tramadol  Current Medications & Allergies (verified) Allergies as of 01/21/2019      Reactions   Latex    "my hands feels numb"      Medication List       Accurate as of January 21, 2019  3:08 PM. If you have any questions, ask your nurse or doctor.        STOP taking these medications   benzonatate 100 MG capsule Commonly known as:  Tessalon Perles     TAKE these medications   ALPRAZolam 0.25 MG tablet Commonly known as:  XANAX Take 1 tablet (0.25 mg total) by mouth 2 (two) times daily as needed. What changed:  reasons to take this   atorvastatin 40 MG tablet Commonly known as:  LIPITOR Take 1 tablet (40 mg total) by mouth daily.   benazepril 40 MG tablet Commonly known as:  LOTENSIN Take 1 tablet (40 mg total) by mouth daily.   cyclobenzaprine 5 MG tablet Commonly known as:  FLEXERIL TAKE 1 TABLET BY MOUTH THREE TIMES DAILY AS NEEDED FOR MUSCLE SPASMS (NEEDS APPOINTMENT)   diltiazem 120 MG 24 hr capsule Commonly known as:  dilTIAZem CD Take 1 capsule (120 mg  total) by mouth daily.   furosemide 40 MG tablet Commonly known as:  LASIX TAKE 1 BY MOUTH EVERY MORNING AND 1/2 BY MOUTH AT 1 IN THE AFTERNOON   glucose blood test strip Test 1x per day and prn- dx 250.02   ONE TOUCH ULTRA TEST test strip Generic drug:  glucose blood USE ONE STRIP TO CHECK GLUCOSE ONCE DAILY   lactulose 10 GM/15ML solution Commonly known as:  CHRONULAC Take 15 mLs (10 g total) by mouth daily.   [place]  Blood Glucose Monitor Devi 1 each by Does not apply route daily. Test 1x per day and prn-dx250.02   LUBRICATING EYE DROPS OP Place 1 drop into both eyes daily as needed (dry eyes).   meclizine 25 MG tablet Commonly known as:  ANTIVERT Take 1 tablet (25 mg total) by mouth 3 (three) times daily as needed for dizziness.   metFORMIN 500 MG tablet Commonly known as:  GLUCOPHAGE Take 1 tablet (500 mg total) by mouth 2 (two) times daily with a meal. What changed:  when to take this   omeprazole 40 MG capsule Commonly known as:  PRILOSEC Take 1 capsule (40 mg total) by mouth daily.   ondansetron 4 MG disintegrating tablet Commonly known as:  Zofran ODT Take 1 tablet (4 mg total) by mouth every 8 (eight) hours as needed.   OneTouch Delica Lancets 23J Misc USE ONE LANCET TO CHECK GLUCOSE ONCE DAILY AND AS NEEDED   traMADol 50 MG tablet Commonly known as:  ULTRAM Take 1 tablet (50 mg total) by mouth every 12 (twelve) hours as needed for moderate pain.   UNABLE TO FIND Place 1 drop under the tongue. Med Name: Hemp Oil, One drop daily under tongue   Vitamin D3 125 MCG (5000 UT) Caps Take 5,000 Units by mouth daily.       History (reviewed): Past Medical History:  Diagnosis Date  . Allergy    seasonal  . Anxiety   . Arthritis    hands, back   . Cataract    surgery to remove  . Chronic back pain    lower back   . Cirrhosis of liver without ascites ([place])    recent dx 07/11/18  . COPD (chronic obstructive pulmonary disease) (HCC)    no inhaler  . Diabetes mellitus without complication ([place])   . DJD (degenerative joint disease)   . Fibromyalgia   . Genetic testing 03/08/2017   [name] underwent genetic counseling and testing for hereditary cancer syndromes on 02/21/2017. Her results were negative for mutations in all 83 genes analyzed by [facility]'s 83-gene Multi-Cancers Panel. Genes analyzed include: ALK, APC, ATM, AXIN2, BAP1, BARD1, BLM, BMPR1A, BRCA1, BRCA2, BRIP1,  CASR, CDC73, CDH1, CDK4, CDKN1B, CDKN1C, CDKN2A, CEBPA, CHEK2, CTNNA1, DICER1, DIS3L2, EGFR, EPCAM  . GERD (gastroesophageal reflux disease)   . 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 History of abnormal mammogram   . Hyperlipidemia   . Hypertension   . Irregular heart beat   . Neuromuscular disorder (HCC)    fibromyalgia  . Sleep apnea    cpap some  . SVD (spontaneous vaginal delivery)    x 2  . Tubular adenoma of colon 05/09/12   "innumerable number" of flat polyps   Past Surgical History:  Procedure Laterality Date  . CARPAL TUNNEL RELEASE Left 10/20/14   [facility]  . CATARACT EXTRACTION, BILATERAL    . [place]  . CHOLECYSTECTOMY  1990  .  COLONOSCOPY    . KNEE ARTHROSCOPY WITH SUBCHONDROPLASTY Right 03/27/2018   Procedure: Right knee arthroscopic partial medial meniscus repair versus menisectomy with medial tibia subchondroplasty;  Surgeon: Nicholes Stairs, MD;  Location: [facility];  Service: Orthopedics;  Laterality: Right;  120 mins  . LARYNX SURGERY     POLYP REMOVAL  . MULTIPLE TOOTH EXTRACTIONS    . POLYPECTOMY     vocal cord  . TONSILLECTOMY  1965  . TUBAL LIGATION    . TUMOR REMOVAL     benign fatty tumors on abdomen  . UPPER GASTROINTESTINAL ENDOSCOPY     Family History  Problem Relation Age of Onset  . Prostate cancer Father        d.89 diagnosed in late-60s  . Colon cancer Father        diagnosed at 71  . Heart failure Mother        d.37  . Heart disease Mother   . Colon polyps Brother        polyposis. Underwent partial colectomy at age 53.  Marland Kitchen Spina bifida Brother        died at birth  . Stomach cancer Paternal Aunt        questionable  . Kidney cancer Cousin        d.78s maternal first-cousin  . Other Other 16       multiple lipomas  . Colon cancer Paternal Uncle        in his 57s  . Heart failure Maternal Grandmother        d.45  . Heart failure Maternal Uncle        d.50  . Colon polyps Son 35  . Heart attack Son 52   . Leukemia Maternal Aunt   . Esophageal cancer Neg Hx   . Rectal cancer Neg Hx    Social History   Socioeconomic History  . Marital status: Married    Spouse name: [name]  . Number of children: 3  . Years of education: 1  . Highest education level: High school graduate  Occupational History  . Occupation: retired  Scientific laboratory technician  . Financial resource strain: Not hard at all  . Food insecurity:    Worry: Never true    Inability: Never true  . Transportation needs:    Medical: No    Non-medical: No  Tobacco Use  . Smoking status: Former Smoker    Packs/day: 1.00    Years: 3.00    Pack years: 3.00    Types: Cigarettes    Last attempt to quit: 08/15/1969    Years since quitting: 49.4  . Smokeless tobacco: Never Used  Substance and Sexual Activity  . Alcohol use: No    Alcohol/week: 0.0 standard drinks  . Drug use: No  . Sexual activity: Not Currently  Lifestyle  . Physical activity:    Days per week: 0 days    Minutes per session: 0 min  . Stress: Not at all  Relationships  . Social connections:    Talks on phone: More than three times a week    Gets together: More than three times a week    Attends religious service: More than 4 times per year    Active member of club or organization: Yes    Attends meetings of clubs or organizations: More than 4 times per year    Relationship status: Married  Other Topics Concern  . Not on file  Social History Narrative  . Not on  file    Activities of Daily Living In your present state of health, do you have any difficulty performing the following activities: 01/21/2019 03/22/2018  Hearing? Y Y  Comment pt states she is hard of hearing -  Vision? N N  Difficulty concentrating or making decisions? N N  Walking or climbing stairs? Y Y  Comment due to her knee -  Dressing or bathing? N N  Doing errands, shopping? N -  Preparing Food and eating ? N -  Using the Toilet? N -  In the past six months, have you accidently leaked  urine? N -  Do you have problems with loss of bowel control? N -  Managing your Medications? N -  Managing your Finances? N -  Housekeeping or managing your Housekeeping? N -  Some recent data might be hidden    Patient Literacy How often do you need to have someone help you when you read instructions, pamphlets, or other written materials from your doctor or pharmacy?: 1 - Never What is the last grade level you completed in school?: 12th grade  Exercise Current Exercise Habits: The patient does not participate in regular exercise at present, Exercise limited by: orthopedic condition(s)  Diet Patient reports consuming 3 meals a day and 1 snack(s) a day Patient reports that her primary diet is: Regular Patient reports that she does have regular access to food.   Depression Screen PHQ 2/9 Scores 01/21/2019 10/09/2018 02/06/2018 12/26/2017 12/19/2017 12/09/2017 05/04/2017  PHQ - 2 Score 0 0 0 0 0 0 0  PHQ- 9 Score - 2 - - - - -     Fall Risk Fall Risk  01/21/2019 10/09/2018 02/06/2018 12/26/2017 12/19/2017  Falls in the past year? 0 0 No No No  Number falls in past yr: - - - - -  Injury with Fall? - - - - -  Risk for fall due to : - - - - -     Objective:  Stacey Summers seemed alert and oriented and she participated appropriately during our telephone visit.  Blood Pressure Weight BMI  BP Readings from Last 3 Encounters:  10/09/18 117/64  08/29/18 (!) 101/56  08/07/18 (!) 119/46   Wt Readings from Last 3 Encounters:  10/09/18 215 lb (97.5 kg)  08/29/18 218 lb (98.9 kg)  08/07/18 217 lb (98.4 kg)   BMI Readings from Last 1 Encounters:  10/09/18 34.70 kg/m    *Unable to obtain current vital signs, weight, and BMI due to telephone visit type  Hearing/Vision  . Stacey Summers did not seem to have difficulty with hearing/understanding during the telephone conversation . Reports that she has not had a formal eye exam by an eye care professional within the past year . Reports that she has not  had a formal hearing evaluation within the past year *Unable to fully assess hearing and vision during telephone visit type  Cognitive Function: 6CIT Screen 01/21/2019  What Year? 0 points  What month? 0 points  What time? 0 points  Count back from 20 0 points  Months in reverse 0 points  Repeat phrase 0 points  Total Score 0    Normal Cognitive Function Screening: Yes (Normal:0-7, Significant for Dysfunction: >8)  Immunization & Health Maintenance Record Immunization History  Administered Date(s) Administered  . Hepb-cpg 07/19/2018, 08/20/2018  . Influenza, High Dose Seasonal PF 08/03/2018  . Influenza,inj,Quad PF,6+ Mos 05/28/2014, 06/16/2015, 06/10/2016  . Influenza,inj,quad, With Preservative 05/15/2017, 08/03/2018  . Influenza-Unspecified 04/24/2017  . Pneumococcal  Conjugate-13 07/21/2014  . Pneumococcal Polysaccharide-23 09/21/2015  . Tdap 07/07/2009  . Zoster Recombinat (Shingrix) 04/24/2017, 07/11/2017    Health Maintenance  Topic Date Due  . PAP SMEAR-Modifier  06/10/2018  . OPHTHALMOLOGY EXAM  12/29/2018  . INFLUENZA VACCINE  03/16/2019  . HEMOGLOBIN A1C  04/09/2019  . MAMMOGRAM  05/29/2019  . TETANUS/TDAP  07/08/2019  . COLON CANCER SCREENING ANNUAL FOBT  08/08/2019  . FOOT EXAM  10/10/2019  . COLONOSCOPY  08/07/2021  . DEXA SCAN  Completed  . Hepatitis C Screening  Completed  . PNA vac Low Risk Adult  Completed       Assessment  This is a routine wellness examination for Stacey Summers.  Health Maintenance: Due or Overdue Health Maintenance Due  Topic Date Due  . PAP SMEAR-Modifier  06/10/2018  . OPHTHALMOLOGY EXAM  12/29/2018    Stacey Summers does not need a referral for Community Assistance: Care Management:   no Social Work:    no Prescription Assistance:  no Nutrition/Diabetes Education:  no   Plan:  Personalized Goals Goals Addressed            This Visit's Progress   . Patient Stated (pt-stated)       Pt states " I would  like to get to walking better"      Personalized Health Maintenance & Screening Recommendations  Diabetic Eye Exam  Lung Cancer Screening Recommended: no (Low Dose CT Chest recommended if Age 44-80 years, 30 pack-year currently smoking OR have quit w/in past 15 years) Hepatitis C Screening recommended: no HIV Screening recommended: no  Advanced Directives: Written information was not prepared per patient's request.  Referrals & Orders No orders of the defined types were placed in this encounter.   Follow-up Plan . Follow-up with Stacey Norlander, DO as planned . Schedule your Diabetic Eye Exam    I have personally reviewed and noted the following in the patient's chart:   . Medical and social history . Use of alcohol, tobacco or illicit drugs  . Current medications and supplements . Functional ability and status . Nutritional status . Physical activity . Advanced directives . List of other physicians . Hospitalizations, surgeries, and ER visits in previous 12 months . Vitals . Screenings to include cognitive, depression, and falls . Referrals and appointments  In addition, I have reviewed and discussed with Stacey Castle Blann certain preventive protocols, quality metrics, and best practice recommendations. A written personalized care plan for preventive services as well as general preventive health recommendations is available and can be mailed to the patient at her request.      Marylin Crosby  01/21/2019

## 2019-01-25 ENCOUNTER — Telehealth (INDEPENDENT_AMBULATORY_CARE_PROVIDER_SITE_OTHER): Payer: PPO | Admitting: Cardiovascular Disease

## 2019-01-25 ENCOUNTER — Encounter: Payer: Self-pay | Admitting: Cardiovascular Disease

## 2019-01-25 DIAGNOSIS — I10 Essential (primary) hypertension: Secondary | ICD-10-CM | POA: Diagnosis not present

## 2019-01-25 DIAGNOSIS — E669 Obesity, unspecified: Secondary | ICD-10-CM

## 2019-01-25 DIAGNOSIS — J410 Simple chronic bronchitis: Secondary | ICD-10-CM | POA: Diagnosis not present

## 2019-01-25 DIAGNOSIS — E7800 Pure hypercholesterolemia, unspecified: Secondary | ICD-10-CM

## 2019-01-25 DIAGNOSIS — G4733 Obstructive sleep apnea (adult) (pediatric): Secondary | ICD-10-CM | POA: Diagnosis not present

## 2019-01-25 DIAGNOSIS — J984 Other disorders of lung: Secondary | ICD-10-CM | POA: Diagnosis not present

## 2019-01-25 DIAGNOSIS — R5381 Other malaise: Secondary | ICD-10-CM | POA: Diagnosis not present

## 2019-01-25 NOTE — Progress Notes (Signed)
Virtual Visit via Telephone Note   This visit type was conducted due to national recommendations for restrictions regarding the COVID-19 Pandemic (e.g. social distancing) in an effort to limit this patient's exposure and mitigate transmission in our community.  Due to her co-morbid illnesses, this patient is at least at moderate risk for complications without adequate follow up.  This format is felt to be most appropriate for this patient at this time.  The patient did not have access to video technology/had technical difficulties with video requiring transitioning to audio format only (telephone).  All issues noted in this document were discussed and addressed.  No physical exam could be performed with this format.  Please refer to the patient's chart for her  consent to telehealth for [facility].   Date:  01/25/2019   ID:  SEQUOIA WITZ, DOB Nov 17, 1945, MRN [id number]  Patient Location: Home Provider Location: Home  PCP:  Janora Norlander, DO  Cardiologist:  Sanda Klein, MD  Electrophysiologist:  None   Evaluation Performed:  Follow-Up Visit  Chief Complaint: Palpitations, obstructive sleep apnea  History of Present Illness:    Stacey Summers is a 73 y.o. female with Morbid obesity, obstructive sleep apnea, essential hypertension, hyperlipidemia and chronic leg edema.  At her last office visit in May she was complaining of palpitations and her amlodipine was stopped with the intention of starting diltiazem.  She stopped the amlodipine and never started taking diltiazem.  Her palpitations have subsided and her blood pressure remains well controlled so she would rather not take more medications.  She is not always compliant with CPAP.  But is now using it more than 50% of the time.  She reports that she is using it more this month than last month.  Sometimes she feels that she smothers if she uses it.  Her complaints are mostly related to problems with back pain and she  will have another injection.  There has been a discussion of possible need for back surgery with Dr. Tonita Cong.  She is concerned about possible "blood clots" although I am not sure why this is specifically her concern.  Her mother died suddenly without clear explanation by postmortem study at age 65.  Her brother, who had multiple health problems diabetes sleep at age 71 not long ago.  She thinks maybe they had clots.  She has chronic and stable thrombocytopenia (platelets 100 K), related to chronic liver disease.  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  The patient does not have symptoms concerning for COVID-19 infection (fever, chills, cough, or new shortness of breath).    Past Medical History:  Diagnosis Date  . Allergy    seasonal  . Anxiety   . Arthritis    hands, back   . Cataract    surgery to remove  . Chronic back pain    lower back   . Cirrhosis of liver without ascites ([place])    recent dx 07/11/18  . COPD (chronic obstructive pulmonary disease) (HCC)    no inhaler  . Diabetes mellitus without complication ([place])   . DJD (degenerative joint disease)   . Fibromyalgia   . Genetic testing 03/08/2017   Ms. Wiemann underwent genetic counseling and testing for hereditary cancer syndromes on 02/21/2017. Her results were negative for mutations in all 83 genes analyzed by [facility]'s 83-gene Multi-Cancers Panel. Genes analyzed include: ALK, APC, ATM, AXIN2, BAP1, BARD1, BLM, BMPR1A, BRCA1, BRCA2, BRIP1,  CASR, CDC73, CDH1, CDK4, CDKN1B, CDKN1C, CDKN2A, CEBPA, CHEK2, CTNNA1, DICER1, DIS3L2, EGFR, EPCAM  . GERD (gastroesophageal reflux disease)   . 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 History of abnormal mammogram   . Hyperlipidemia   . Hypertension   . Irregular heart beat   . Neuromuscular disorder (HCC)    fibromyalgia  . Sleep apnea    cpap some  . SVD (spontaneous vaginal delivery)    x 2  . Tubular  adenoma of colon 05/09/12   "innumerable number" of flat polyps   Past Surgical History:  Procedure Laterality Date  . CARPAL TUNNEL RELEASE Left 10/20/14   [facility]  . CATARACT EXTRACTION, BILATERAL    . [place]  . CHOLECYSTECTOMY  1990  . COLONOSCOPY    . KNEE ARTHROSCOPY WITH SUBCHONDROPLASTY Right 03/27/2018   Procedure: Right knee arthroscopic partial medial meniscus repair versus menisectomy with medial tibia subchondroplasty;  Surgeon: Nicholes Stairs, MD;  Location: [place];  Service: Orthopedics;  Laterality: Right;  120 mins  . LARYNX SURGERY     POLYP REMOVAL  . MULTIPLE TOOTH EXTRACTIONS    . POLYPECTOMY     vocal cord  . TONSILLECTOMY  1965  . TUBAL LIGATION    . TUMOR REMOVAL     benign fatty tumors on abdomen  . UPPER GASTROINTESTINAL ENDOSCOPY       Current Meds  Medication Sig  . ALPRAZolam (XANAX) 0.25 MG tablet Take 1 tablet (0.25 mg total) by mouth 2 (two) times daily as needed. (Patient taking differently: Take 0.25 mg by mouth 2 (two) times daily as needed for anxiety. )  . atorvastatin (LIPITOR) 40 MG tablet Take 1 tablet (40 mg total) by mouth daily.  . benazepril (LOTENSIN) 40 MG tablet Take 1 tablet (40 mg total) by mouth daily.  . Blood Glucose Monitoring Suppl (LIBERTY BLOOD GLUCOSE MONITOR) DEVI 1 each by Does not apply route daily. Test 1x per day and prn-dx250.02  . Carboxymethylcellul-Glycerin (LUBRICATING EYE DROPS OP) Place 1 drop into both eyes daily as needed (dry eyes).  . Cholecalciferol (VITAMIN D3) 5000 units CAPS Take 5,000 Units by mouth daily.  . cyclobenzaprine (FLEXERIL) 5 MG tablet TAKE 1 TABLET BY MOUTH THREE TIMES DAILY AS NEEDED FOR MUSCLE SPASMS (NEEDS APPOINTMENT)  . diltiazem (DILTIAZEM CD) 120 MG 24 hr capsule Take 1 capsule (120 mg total) by mouth daily.  . furosemide (LASIX) 40 MG tablet TAKE 1 BY MOUTH EVERY MORNING AND 1/2 BY MOUTH AT 1 IN THE AFTERNOON  . glucose blood test strip Test 1x per day and prn- dx  250.02  . lactulose (CHRONULAC) 10 GM/15ML solution Take 15 mLs (10 g total) by mouth daily.  . meclizine (ANTIVERT) 25 MG tablet Take 1 tablet (25 mg total) by mouth 3 (three) times daily as needed for dizziness.  . metFORMIN (GLUCOPHAGE) 500 MG tablet Take 1 tablet (500 mg total) by mouth 2 (two) times daily with a meal. (Patient taking differently: Take 500 mg by mouth daily. )  . omeprazole (PRILOSEC) 40 MG capsule Take 1 capsule (40 mg total) by mouth daily.  . ondansetron (ZOFRAN ODT) 4 MG disintegrating tablet Take 1 tablet (4 mg total) by mouth every 8 (eight) hours as needed.  . ONE TOUCH ULTRA TEST test strip USE ONE STRIP TO CHECK GLUCOSE ONCE DAILY  . ONETOUCH DELICA LANCETS 72Z MISC USE ONE LANCET TO CHECK GLUCOSE ONCE DAILY AND AS NEEDED  .  traMADol (ULTRAM) 50 MG tablet Take 1 tablet (50 mg total) by mouth every 12 (twelve) hours as needed for moderate pain.  Marland Kitchen UNABLE TO FIND Place 1 drop under the tongue. Med Name: Hemp Oil, One drop daily under tongue     Allergies:   Latex   Social History   Tobacco Use  . Smoking status: Former Smoker    Packs/day: 1.00    Years: 3.00    Pack years: 3.00    Types: Cigarettes    Quit date: 08/15/1969    Years since quitting: 49.4  . Smokeless tobacco: Never Used  Substance Use Topics  . Alcohol use: No    Alcohol/week: 0.0 standard drinks  . Drug use: No     Family Hx: The patient's family history includes Colon cancer in her father and paternal uncle; Colon polyps in her brother; Colon polyps (age of onset: 48) in her son; Heart attack (age of onset: 35) in her son; Heart disease in her mother; Heart failure in her maternal grandmother, maternal uncle, and mother; Kidney cancer in her cousin; Leukemia in her maternal aunt; Other (age of onset: 74) in an other family member; Prostate cancer in her father; Spina bifida in her brother; Stomach cancer in her paternal aunt. There is no history of Esophageal cancer or Rectal cancer.  ROS:    Please see the history of present illness.     All other systems reviewed and are negative.   Prior CV studies:   The following studies were reviewed today:  Echocardiogram from 2014 was a normal study other than mild left atrial enlargement.  EF was 55-60%.  Labs/Other Tests and Data Reviewed:    EKG:  An ECG dated 02/06/2018 was personally reviewed today and demonstrated:  Normal sinus rhythm with a single PVC, otherwise normal tracing  Recent Labs: 01/01/2019: ALT 34; BUN 15; Creatinine, Ser 0.76; Hemoglobin 13.0; Platelets 103; Potassium 4.4; Sodium 144; TSH 4.090   Recent Lipid Panel Lab Results  Component Value Date/Time   CHOL 103 02/06/2018 03:22 PM   CHOL 162 12/20/2012 10:44 AM   TRIG 83 02/06/2018 03:22 PM   TRIG 91 12/25/2014 10:20 AM   TRIG 83 12/20/2012 10:44 AM   HDL 32 (L) 02/06/2018 03:22 PM   HDL 42 12/25/2014 10:20 AM   HDL 42 12/20/2012 10:44 AM   CHOLHDL 3.2 02/06/2018 03:22 PM   LDLCALC 54 02/06/2018 03:22 PM   LDLCALC 59 01/29/2014 10:31 AM   LDLCALC 103 (H) 12/20/2012 10:44 AM    Wt Readings from Last 3 Encounters:  01/25/19 211 lb 9.6 oz (96 kg)  10/09/18 215 lb (97.5 kg)  08/29/18 218 lb (98.9 kg)     Objective:    Vital Signs:  BP 130/64   Ht _0  (1.575 m)   Wt 211 lb 9.6 oz (96 kg)   BMI 38.70 kg/m    VITAL SIGNS:  reviewed Unable to examine  ASSESSMENT & PLAN:    1. Palpitations: Have subsided.  She would rather not take the diltiazem.  I think that is perfectly reasonable. 2. HTN: Well controlled even off amlodipine. 3. Cirrhosis: Uncertain etiology, presumed due to fatty liver, causing her thrombocytopenia.  She asked what stage of liver disease she is then.  I told her that I am not a gastroenterologist but I would categorize her as Child-Pugh A.  She has very mild hypoalbuminemia, otherwise no clear evidence of parenchymal decompensation.  Weight loss is probably the most important  intervention to help her liver disease. 4.  Severe obesity with comorbidities: Liver disease, sleep apnea and hypertension are all related to her weight. 5. HLP: On statin.  LDL is low, but so is HDL.  At this point no evidence that the statin is harming her liver. 6. OSA: I recommended 100% compliance with CPAP.  COVID-19 Education: The signs and symptoms of COVID-19 were discussed with the patient and how to seek care for testing (follow up with PCP or arrange E-visit).  The importance of social distancing was discussed today.  Time:   Today, I have spent 21 minutes with the patient with telehealth technology discussing the above problems.     Medication Adjustments/Labs and Tests Ordered: Current medicines are reviewed at length with the patient today.  Concerns regarding medicines are outlined above.   Tests Ordered: No orders of the defined types were placed in this encounter.   Medication Changes: No orders of the defined types were placed in this encounter.   Follow Up:  Virtual Visit or In Person 12 months  Signed, Sanda Klein, MD  01/25/2019 11:43 AM    [place]

## 2019-01-25 NOTE — Patient Instructions (Signed)
Medication Instructions:  Your physician recommends that you continue on your current medications as directed. Please refer to the Current Medication list given to you today.  If you need a refill on your cardiac medications before your next appointment, please call your pharmacy.   Lab work: None ordered  Testing/Procedures: None ordered  Follow-Up: At [facility], you and your health needs are our priority.  As part of our continuing mission to provide you with exceptional heart care, we have created designated Provider Care Teams.  These Care Teams include your primary Cardiologist (physician) and Advanced Practice Providers (APPs -  Physician Assistants and Nurse Practitioners) who all work together to provide you with the care you need, when you need it. You will need a follow up appointment in 12 months.  Please call our office 2 months in advance to schedule this appointment.  You may see Sanda Klein, MD or one of the following Advanced Practice Providers on your designated Care Team: Almyra Deforest, PA-C Fabian Sharp, [place]

## 2019-01-31 DIAGNOSIS — M5136 Other intervertebral disc degeneration, lumbar region: Secondary | ICD-10-CM | POA: Diagnosis not present

## 2019-02-25 ENCOUNTER — Telehealth: Payer: Self-pay | Admitting: General Surgery

## 2019-02-25 NOTE — Telephone Encounter (Signed)
Covid-19 screening questions   Do you now or have you had a fever in the last 14 days? NO  Do you have any respiratory symptoms of shortness of breath or cough now or in the last 14 days?NO  Do you have any family members or close contacts with diagnosed or suspected Covid-19 in the past 14 days?NO  Have you been tested for Covid-19 and found to be positive? NO  Patient informed she and her healthcare partner need to wear a mask to the clinic. Patient verbalized understanding.

## 2019-02-26 ENCOUNTER — Other Ambulatory Visit: Payer: Self-pay

## 2019-02-26 ENCOUNTER — Other Ambulatory Visit (INDEPENDENT_AMBULATORY_CARE_PROVIDER_SITE_OTHER): Payer: PPO

## 2019-02-26 ENCOUNTER — Encounter: Payer: Self-pay | Admitting: Gastroenterology

## 2019-02-26 ENCOUNTER — Ambulatory Visit (INDEPENDENT_AMBULATORY_CARE_PROVIDER_SITE_OTHER): Payer: PPO | Admitting: Gastroenterology

## 2019-02-26 VITALS — BP 130/68 | HR 60 | Temp 98.3°F | Ht 61.0 in | Wt 211.4 lb

## 2019-02-26 DIAGNOSIS — K7581 Nonalcoholic steatohepatitis (NASH): Secondary | ICD-10-CM

## 2019-02-26 DIAGNOSIS — K7460 Unspecified cirrhosis of liver: Secondary | ICD-10-CM

## 2019-02-26 LAB — CBC WITH DIFFERENTIAL/PLATELET
Basophils Absolute: 0.1 10*3/uL (ref 0.0–0.1)
Basophils Relative: 1.1 % (ref 0.0–3.0)
Eosinophils Absolute: 0.2 10*3/uL (ref 0.0–0.7)
Eosinophils Relative: 4.1 % (ref 0.0–5.0)
HCT: 40.7 % (ref 36.0–46.0)
Hemoglobin: 14 g/dL (ref 12.0–15.0)
Lymphocytes Relative: 37.7 % (ref 12.0–46.0)
Lymphs Abs: 2.2 10*3/uL (ref 0.7–4.0)
MCHC: 34.5 g/dL (ref 30.0–36.0)
MCV: 95.5 fl (ref 78.0–100.0)
Monocytes Absolute: 0.5 10*3/uL (ref 0.1–1.0)
Monocytes Relative: 8.7 % (ref 3.0–12.0)
Neutro Abs: 2.8 10*3/uL (ref 1.4–7.7)
Neutrophils Relative %: 48.4 % (ref 43.0–77.0)
Platelets: 116 10*3/uL — ABNORMAL LOW (ref 150.0–400.0)
RBC: 4.26 Mil/uL (ref 3.87–5.11)
RDW: 13.8 % (ref 11.5–15.5)
WBC: 5.8 10*3/uL (ref 4.0–10.5)

## 2019-02-26 LAB — COMPREHENSIVE METABOLIC PANEL
ALT: 23 U/L (ref 0–35)
AST: 38 U/L — ABNORMAL HIGH (ref 0–37)
Albumin: 3.8 g/dL (ref 3.5–5.2)
Alkaline Phosphatase: 76 U/L (ref 39–117)
BUN: 18 mg/dL (ref 6–23)
CO2: 27 mEq/L (ref 19–32)
Calcium: 9.2 mg/dL (ref 8.4–10.5)
Chloride: 110 mEq/L (ref 96–112)
Creatinine, Ser: 0.73 mg/dL (ref 0.40–1.20)
GFR: 78.6 mL/min (ref >60.00)
Glucose, Bld: 128 mg/dL — ABNORMAL HIGH (ref 70–99)
Potassium: 4.3 mEq/L (ref 3.5–5.1)
Sodium: 145 mEq/L (ref 135–145)
Total Bilirubin: 3.5 mg/dL — ABNORMAL HIGH (ref 0.2–1.2)
Total Protein: 6.2 g/dL (ref 6.0–8.3)

## 2019-02-26 LAB — PROTIME-INR
INR: 1.3 ratio — ABNORMAL HIGH (ref 0.8–1.0)
Prothrombin Time: 15 s — ABNORMAL HIGH (ref 9.6–13.1)

## 2019-02-26 MED ORDER — ONDANSETRON 4 MG PO TBDP: 4.0000 mg | ORAL_TABLET | Freq: Three times a day (TID) | ORAL | 0 refills | Status: DC | PRN

## 2019-02-26 NOTE — Patient Instructions (Addendum)
If you are age 71 or older, your body mass index should be between 23-30. Your Body mass index is 39.94 kg/m. If this is out of the aforementioned range listed, please consider follow up with your Primary Care Provider.  If you are age 11 or younger, your body mass index should be between 19-25. Your Body mass index is 39.94 kg/m. If this is out of the aformentioned range listed, please consider follow up with your Primary Care Provider.   Your provider has requested that you go to the basement level for lab work before leaving today. Press "B" on the elevator. The lab is located at the first door on the left as you exit the elevator.  We have sent the following medications to your pharmacy for you to pick up at your convenience: Zofran  You will need a CT scan of abdomen and pelvis with contrast in November 2020.  We will put in a recall to have this done in November.  Follow up with Dr. Silverio Decamp in January 2021.  Thank you for choosing me and [facility].   Alonza Bogus, PA-C

## 2019-02-26 NOTE — Progress Notes (Signed)
02/26/2019 Stacey Summers [id number] 05-18-1946   HISTORY OF PRESENT ILLNESS:  This is Summers 73 year old female with newly diagnosed cirrhosis when I saw her in 06/2018.  Has associated thrombocytopenia, borderline splenomegaly, and varices by CT scan in the left upper quadrant all concerning for portal hypertension, but EGD showed no esophageal or gastric varices.  Platelets have been low for at least the past 9 years.  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  Up to this point appears to be Stacey Summers, MELD 12.  Has LE edema for quite sometime that is controlled with lasix by PCP.  On lactulose once Summers day, but not sure that she's had any issues with HE.  Think she has baseline memory issues, however.  They do not note any changes in her memory or mental status recently.  She is here with her friend for 6 month follow-up.  They again have several questions, most of which we discussed extensively at her visit in November.  Her cirrhosis was diagnosed after she had imaging study that showed this.  No dark or bloody stools.     Past Medical History:  Diagnosis Date  . Allergy    seasonal  . Anxiety   . Arthritis    hands, back   . Cataract    surgery to remove  . Chronic back pain    lower back   . Cirrhosis of liver without ascites ([place])    recent dx 07/11/18  . COPD (chronic obstructive pulmonary disease) (HCC)    no inhaler  . Diabetes mellitus without complication ([place])   . DJD (degenerative joint disease)   . Fibromyalgia   . Genetic testing 03/08/2017   Stacey Summers underwent genetic counseling and testing for hereditary cancer syndromes on 02/21/2017. Her results were negative for mutations in all 83 genes analyzed by [facility]'s 83-gene Multi-Cancers Panel. Genes analyzed include: ALK, APC, ATM, AXIN2, BAP1, BARD1, BLM, BMPR1A, BRCA1, BRCA2, BRIP1, CASR, CDC73, CDH1, CDK4, CDKN1B, CDKN1C, CDKN2A, CEBPA, CHEK2, CTNNA1, DICER1, DIS3L2, EGFR,  EPCAM  . GERD (gastroesophageal reflux disease)   . 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 History of abnormal mammogram   . Hyperlipidemia   . Hypertension   . Irregular heart beat   . Neuromuscular disorder (HCC)    fibromyalgia  . Sleep apnea    cpap some  . SVD (spontaneous vaginal delivery)    x 2  . Tubular adenoma of colon 05/09/12   "innumerable number" of flat polyps   Past Surgical History:  Procedure Laterality Date  . CARPAL TUNNEL RELEASE Left 10/20/14   [facility]  . CATARACT EXTRACTION, BILATERAL    . [place]  . CHOLECYSTECTOMY  1990  . COLONOSCOPY    . KNEE ARTHROSCOPY WITH SUBCHONDROPLASTY Right 03/27/2018   Procedure: Right knee arthroscopic partial medial meniscus repair versus menisectomy with medial tibia subchondroplasty;  Surgeon: Nicholes Stairs, MD;  Location: [place];  Service: Orthopedics;  Laterality: Right;  120 mins  . LARYNX SURGERY     POLYP REMOVAL  . MULTIPLE TOOTH EXTRACTIONS    . POLYPECTOMY     vocal cord  . TONSILLECTOMY  1965  . TUBAL LIGATION    . TUMOR REMOVAL     benign fatty tumors on abdomen  . UPPER GASTROINTESTINAL ENDOSCOPY      reports that she quit smoking about 49 years ago. Her smoking use included cigarettes.  She has Summers 3.00 pack-year smoking history. She has never used smokeless tobacco. She reports that she does not drink alcohol or use drugs. family history includes Colon cancer in her father and paternal uncle; Colon polyps in her brother; Colon polyps (age of onset: 90) in her son; Heart attack (age of onset: 72) in her son; Heart disease in her mother; Heart failure in her maternal grandmother, maternal uncle, and mother; Kidney cancer in her cousin; Leukemia in her maternal aunt; Other (age of onset: 79) in an other family member; Prostate cancer in her father; Spina bifida in her brother; Stomach cancer in her paternal aunt. Allergies  Allergen Reactions  . Latex     "my hands feels numb"       Outpatient Encounter Medications as of 02/26/2019  Medication Sig  . ALPRAZolam (XANAX) 0.25 MG tablet Take 1 tablet (0.25 mg total) by mouth 2 (two) times daily as needed. (Patient taking differently: Take 0.25 mg by mouth 2 (two) times daily as needed for anxiety. )  . atorvastatin (LIPITOR) 40 MG tablet Take 1 tablet (40 mg total) by mouth daily.  . benazepril (LOTENSIN) 40 MG tablet Take 1 tablet (40 mg total) by mouth daily.  . Blood Glucose Monitoring Suppl (LIBERTY BLOOD GLUCOSE MONITOR) DEVI 1 each by Does not apply route daily. Test 1x per day and prn-dx250.02  . Carboxymethylcellul-Glycerin (LUBRICATING EYE DROPS OP) Place 1 drop into both eyes daily as needed (dry eyes).  . Cholecalciferol (VITAMIN D3) 5000 units CAPS Take 5,000 Units by mouth daily.  . cyclobenzaprine (FLEXERIL) 5 MG tablet TAKE 1 TABLET BY MOUTH THREE TIMES DAILY AS NEEDED FOR MUSCLE SPASMS (NEEDS APPOINTMENT)  . diltiazem (DILTIAZEM CD) 120 MG 24 hr capsule Take 1 capsule (120 mg total) by mouth daily. (Patient taking differently: Take 120 mg by mouth as needed. )  . furosemide (LASIX) 40 MG tablet TAKE 1 BY MOUTH EVERY MORNING AND 1/2 BY MOUTH AT 1 IN THE AFTERNOON  . glucose blood test strip Test 1x per day and prn- dx 250.02  . lactulose (CHRONULAC) 10 GM/15ML solution Take 15 mLs (10 g total) by mouth daily.  . meclizine (ANTIVERT) 25 MG tablet Take 1 tablet (25 mg total) by mouth 3 (three) times daily as needed for dizziness.  . metFORMIN (GLUCOPHAGE) 500 MG tablet Take 1 tablet (500 mg total) by mouth 2 (two) times daily with Summers meal. (Patient taking differently: Take 500 mg by mouth daily. )  . omeprazole (PRILOSEC) 40 MG capsule Take 1 capsule (40 mg total) by mouth daily.  . ondansetron (ZOFRAN ODT) 4 MG disintegrating tablet Take 1 tablet (4 mg total) by mouth every 8 (eight) hours as needed.  . ONE TOUCH ULTRA TEST test strip USE ONE STRIP TO CHECK GLUCOSE ONCE DAILY  . ONETOUCH DELICA LANCETS 50K MISC  USE ONE LANCET TO CHECK GLUCOSE ONCE DAILY AND AS NEEDED  . traMADol (ULTRAM) 50 MG tablet Take 1 tablet (50 mg total) by mouth every 12 (twelve) hours as needed for moderate pain.  Marland Kitchen UNABLE TO FIND Place 1 drop under the tongue. Med Name: Hemp Oil, One drop daily under tongue   No facility-administered encounter medications on file as of 02/26/2019.      REVIEW OF SYSTEMS  : All other systems reviewed and negative except where noted in the History of Present Illness.   PHYSICAL EXAM: BP 130/68 (BP Location: Left Wrist, Patient Position: Sitting, Cuff Size: Normal)   Pulse 60 Comment:  irregular  Temp 98.3 F (36.8 C)   Ht _0  (1.549 m)   Wt 211 lb 6 oz (95.9 kg)   BMI 39.94 kg/m  General: Well developed white female in no acute distress Head: Normocephalic and atraumatic Eyes:  Sclerae anicteric, conjunctiva pink. Ears: Normal auditory acuity Lungs: Clear throughout to auscultation; no increased WOB Heart: Regular rate and rhythm; no M/R/G. Abdomen: Soft, non-distended.  BS present.  Non-tender. Musculoskeletal: Symmetrical with no gross deformities  Skin: No lesions on visible extremities Extremities: No edema  Neurological: Alert oriented x 4, grossly non-focal Psychological:  Alert and cooperative. Normal mood and affect  ASSESSMENT AND PLAN: *73 year old female with newly diagnosed cirrhosis when I saw her in 06/2018.  Has associated thrombocytopenia, borderline splenomegaly, and varices by CT scan in the left upper quadrant all concerning for portal hypertension but EGD showed no esophageal or gastric varices.  Platelets have been low for at least the past 9 years.  Presumably this is due to [place] after extensive liver serologies negative.  Up to this point appears to be Stacey Summers, MELD 12.  Has LE edema for quite sometime that is controlled with lasix by PCP.  On lactulose once Summers day, but not sure that she's had any issues with HE.  Think she has baseline memory issues,  however.  -Will check labs today including CBC, CMP, PT/INR.  Will need repeat surveillance imaging in November so will enter her for CT scan recall.  OV follow-up in 6 months or sooner if any issues.   **I spent an extensive amount of time with the patient and her friend discussing her diagnosis, cause, potential complications, etc.  25 minutes were spent with the patient in which at least 50% of it was in counseling.   CC:  Janora Norlander, DO

## 2019-02-27 ENCOUNTER — Telehealth: Payer: Self-pay | Admitting: Gastroenterology

## 2019-02-27 NOTE — Telephone Encounter (Signed)
Patient calling about lab results

## 2019-02-27 NOTE — Telephone Encounter (Signed)
Please let her know that most of her labs look great, very stable and much improved with the exception of her total bilirubin, which is gone up slightly.  This is one of the big factors when we talk about the child's Pugh score and because that has increased some it has bumped her from the child's Pugh A to a child's Pugh B.  Like I said everything else looks very good and stable.  I say this because they were very adamant on knowing the severity of her condition when I saw her yesterday.  Thank you,  Jess

## 2019-02-27 NOTE — Telephone Encounter (Signed)
Janett Billow can you please review the labs for this pt.  Looks like you ordered labs but they were ordered under another doctor. She is calling for results Thanks

## 2019-02-28 ENCOUNTER — Encounter: Payer: Self-pay | Admitting: Gastroenterology

## 2019-02-28 NOTE — Telephone Encounter (Signed)
Left message on machine to call back  

## 2019-03-01 NOTE — Telephone Encounter (Signed)
The pt has been advised of the results and thanked me for calling

## 2019-03-07 NOTE — Progress Notes (Signed)
Reviewed and agree with documentation and assessment and plan. K. Veena Ralene Gasparyan , MD   

## 2019-03-11 DIAGNOSIS — M545 Low back pain: Secondary | ICD-10-CM | POA: Diagnosis not present

## 2019-03-11 DIAGNOSIS — M5136 Other intervertebral disc degeneration, lumbar region: Secondary | ICD-10-CM | POA: Diagnosis not present

## 2019-04-01 ENCOUNTER — Other Ambulatory Visit: Payer: Self-pay

## 2019-04-01 DIAGNOSIS — M48061 Spinal stenosis, lumbar region without neurogenic claudication: Secondary | ICD-10-CM | POA: Diagnosis not present

## 2019-04-01 DIAGNOSIS — M4310 Spondylolisthesis, site unspecified: Secondary | ICD-10-CM | POA: Diagnosis not present

## 2019-04-01 DIAGNOSIS — Z6841 Body Mass Index (BMI) 40.0 and over, adult: Secondary | ICD-10-CM | POA: Diagnosis not present

## 2019-04-02 ENCOUNTER — Encounter: Payer: Self-pay | Admitting: Family Medicine

## 2019-04-02 ENCOUNTER — Ambulatory Visit (INDEPENDENT_AMBULATORY_CARE_PROVIDER_SITE_OTHER): Payer: PPO | Admitting: Family Medicine

## 2019-04-02 VITALS — BP 128/65 | HR 72 | Temp 98.7°F | Ht 61.0 in | Wt 218.0 lb

## 2019-04-02 DIAGNOSIS — G8929 Other chronic pain: Secondary | ICD-10-CM | POA: Diagnosis not present

## 2019-04-02 DIAGNOSIS — Z79899 Other long term (current) drug therapy: Secondary | ICD-10-CM

## 2019-04-02 DIAGNOSIS — F411 Generalized anxiety disorder: Secondary | ICD-10-CM

## 2019-04-02 DIAGNOSIS — R251 Tremor, unspecified: Secondary | ICD-10-CM

## 2019-04-02 DIAGNOSIS — E119 Type 2 diabetes mellitus without complications: Secondary | ICD-10-CM | POA: Diagnosis not present

## 2019-04-02 DIAGNOSIS — K219 Gastro-esophageal reflux disease without esophagitis: Secondary | ICD-10-CM

## 2019-04-02 DIAGNOSIS — E785 Hyperlipidemia, unspecified: Secondary | ICD-10-CM | POA: Diagnosis not present

## 2019-04-02 DIAGNOSIS — I10 Essential (primary) hypertension: Secondary | ICD-10-CM | POA: Diagnosis not present

## 2019-04-02 DIAGNOSIS — M5442 Lumbago with sciatica, left side: Secondary | ICD-10-CM

## 2019-04-02 DIAGNOSIS — E1169 Type 2 diabetes mellitus with other specified complication: Secondary | ICD-10-CM | POA: Diagnosis not present

## 2019-04-02 DIAGNOSIS — E1159 Type 2 diabetes mellitus with other circulatory complications: Secondary | ICD-10-CM | POA: Diagnosis not present

## 2019-04-02 LAB — BAYER DCA HB A1C WAIVED: HB A1C (BAYER DCA - WAIVED): 6.2 % (ref <7.0)

## 2019-04-02 MED ORDER — BENAZEPRIL HCL 40 MG PO TABS: 40.0000 mg | ORAL_TABLET | Freq: Every day | ORAL | 1 refills | Status: DC

## 2019-04-02 MED ORDER — ALPRAZOLAM 0.25 MG PO TABS: 0.2500 mg | ORAL_TABLET | Freq: Two times a day (BID) | ORAL | 0 refills | Status: DC | PRN

## 2019-04-02 MED ORDER — OMEPRAZOLE 40 MG PO CPDR: 40.0000 mg | DELAYED_RELEASE_CAPSULE | Freq: Every day | ORAL | 1 refills | Status: DC

## 2019-04-02 MED ORDER — ATORVASTATIN CALCIUM 40 MG PO TABS: 40.0000 mg | ORAL_TABLET | Freq: Every day | ORAL | 1 refills | Status: DC

## 2019-04-02 MED ORDER — TRAMADOL HCL 50 MG PO TABS: 50.0000 mg | ORAL_TABLET | Freq: Two times a day (BID) | ORAL | 1 refills | Status: DC | PRN

## 2019-04-02 NOTE — Progress Notes (Signed)
Subjective: CC: f/u DM2, GAD PCP: Janora Norlander, DO [id number] L Stacey Summers is a 73 y.o. female presenting to clinic today for:  1. Type 2 Diabetes w/ HTN, HLD:  Patient reports High at home: rare 200; Low at home: 120, Taking medication(s): Metformin 500 mg at least once daily but sometimes twice daily, Lipitor 40 mg daily, Norvasc 5 mg daily, benazepril 40 mg daily and Lasix.  Last eye exam: sees [facility] Dr. Maryjane Hurter foot exam: UTD Last A1c:  Lab Results  Component Value Date   HGBA1C 5.9 10/09/2018   Nephropathy screen indicated?: on ACE-I Last flu, zoster and/or pneumovax:  Immunization History  Administered Date(s) Administered  . Hepb-cpg 07/19/2018, 08/20/2018  . Influenza, High Dose Seasonal PF 08/03/2018  . Influenza,inj,Quad PF,6+ Mos 05/28/2014, 06/16/2015, 06/10/2016  . Influenza,inj,quad, With Preservative 05/15/2017, 08/03/2018  . Influenza-Unspecified 04/24/2017  . Pneumococcal Conjugate-13 07/21/2014  . Pneumococcal Polysaccharide-23 09/21/2015  . Tdap 07/07/2009  . Zoster Recombinat (Shingrix) 04/24/2017, 07/11/2017    ROS: denies chest pain, shortness of breath, lower extremity edema, visual disturbance, polydipsia or polyuria.  2. Chronic low back pain Currently being followed by Dr. Rolena Infante.  She was told that she may need surgical intervention but she would certainly need clearance from her other specialist prior to proceeding with this.  In the interim, they have recommended continuing tramadol and she reports rare usage of this.  She tries to use it very sparingly as she does not want to become dependent on the medication.  She denies constipation, falls, dizziness.  3.  Tremor Patient reports several month history of worsening tremor that seems to be more prominent with intentional activities like writing.  She denies that her handwriting looks smaller.  No known history of Parkinson's.  Her memory is good.  Denies use of products like Benadryl.  ROS:  Per HPI  Allergies  Allergen Reactions  . Latex     "my hands feels numb"   Past Medical History:  Diagnosis Date  . Allergy    seasonal  . Anxiety   . Arthritis    hands, back   . Cataract    surgery to remove  . Chronic back pain    lower back   . Cirrhosis of liver without ascites ([place])    recent dx 07/11/18  . COPD (chronic obstructive pulmonary disease) (HCC)    no inhaler  . Diabetes mellitus without complication ([place])   . DJD (degenerative joint disease)   . Fibromyalgia   . Genetic testing 03/08/2017   Ms. Senner underwent genetic counseling and testing for hereditary cancer syndromes on 02/21/2017. Her results were negative for mutations in all 83 genes analyzed by [facility]'s 83-gene Multi-Cancers Panel. Genes analyzed include: ALK, APC, ATM, AXIN2, BAP1, BARD1, BLM, BMPR1A, BRCA1, BRCA2, BRIP1, CASR, CDC73, CDH1, CDK4, CDKN1B, CDKN1C, CDKN2A, CEBPA, CHEK2, CTNNA1, DICER1, DIS3L2, EGFR, EPCAM  . GERD (gastroesophageal reflux disease)   . 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 History of abnormal mammogram   . Hyperlipidemia   . Hypertension   . Irregular heart beat   . Neuromuscular disorder (HCC)    fibromyalgia  . Sleep apnea    cpap some  . SVD (spontaneous vaginal delivery)    x 2  . Tubular adenoma of colon 05/09/12   "innumerable number" of flat polyps    Current Outpatient Medications:  .  ALPRAZolam (XANAX) 0.25 MG tablet, Take 1 tablet (0.25 mg total) by mouth 2 (two)  times daily as needed. (Patient taking differently: Take 0.25 mg by mouth 2 (two) times daily as needed for anxiety. ), Disp: 30 tablet, Rfl: 1 .  atorvastatin (LIPITOR) 40 MG tablet, Take 1 tablet (40 mg total) by mouth daily., Disp: 90 tablet, Rfl: 1 .  benazepril (LOTENSIN) 40 MG tablet, Take 1 tablet (40 mg total) by mouth daily., Disp: 90 tablet, Rfl: 1 .  Blood Glucose Monitoring Suppl (LIBERTY BLOOD GLUCOSE MONITOR) DEVI, 1 each by Does not apply route daily. Test 1x per  day and prn-dx250.02, Disp: 1 each, Rfl: 0 .  Cholecalciferol (VITAMIN D3) 5000 units CAPS, Take 5,000 Units by mouth daily., Disp: , Rfl:  .  cyclobenzaprine (FLEXERIL) 5 MG tablet, TAKE 1 TABLET BY MOUTH THREE TIMES DAILY AS NEEDED FOR MUSCLE SPASMS (NEEDS APPOINTMENT), Disp: 30 tablet, Rfl: 0 .  furosemide (LASIX) 40 MG tablet, TAKE 1 BY MOUTH EVERY MORNING AND 1/2 BY MOUTH AT 1 IN THE AFTERNOON, Disp: 135 tablet, Rfl: 1 .  glucose blood test strip, Test 1x per day and prn- dx 250.02, Disp: 100 each, Rfl: 2 .  meclizine (ANTIVERT) 25 MG tablet, Take 1 tablet (25 mg total) by mouth 3 (three) times daily as needed for dizziness., Disp: 30 tablet, Rfl: 0 .  metFORMIN (GLUCOPHAGE) 500 MG tablet, Take 1 tablet (500 mg total) by mouth 2 (two) times daily with a meal. (Patient taking differently: Take 500 mg by mouth daily. ), Disp: 180 tablet, Rfl: 1 .  omeprazole (PRILOSEC) 40 MG capsule, Take 1 capsule (40 mg total) by mouth daily., Disp: 90 capsule, Rfl: 1 .  ondansetron (ZOFRAN ODT) 4 MG disintegrating tablet, Take 1 tablet (4 mg total) by mouth every 8 (eight) hours as needed., Disp: 30 tablet, Rfl: 0 .  ONE TOUCH ULTRA TEST test strip, USE ONE STRIP TO CHECK GLUCOSE ONCE DAILY, Disp: 100 each, Rfl: 1 .  ONETOUCH DELICA LANCETS 70J MISC, USE ONE LANCET TO CHECK GLUCOSE ONCE DAILY AND AS NEEDED, Disp: 100 each, Rfl: 0 .  traMADol (ULTRAM) 50 MG tablet, Take 1 tablet (50 mg total) by mouth every 12 (twelve) hours as needed for moderate pain., Disp: 30 tablet, Rfl: 0 .  UNABLE TO FIND, Place 1 drop under the tongue. Med Name: Hemp Oil, One drop daily under tongue, Disp: , Rfl:  .  diltiazem (DILTIAZEM CD) 120 MG 24 hr capsule, Take 1 capsule (120 mg total) by mouth daily. (Patient not taking: Reported on 04/02/2019), Disp: 90 capsule, Rfl: 1 .  lactulose (CHRONULAC) 10 GM/15ML solution, Take 15 mLs (10 g total) by mouth daily. (Patient not taking: Reported on 04/02/2019), Disp: 236 mL, Rfl: 11 Social  History   Socioeconomic History  . Marital status: Married    Spouse name: [name]  . Number of children: 3  . Years of education: 67  . Highest education level: High school graduate  Occupational History  . Occupation: retired  Scientific laboratory technician  . Financial resource strain: Not hard at all  . Food insecurity    Worry: Never true    Inability: Never true  . Transportation needs    Medical: No    Non-medical: No  Tobacco Use  . Smoking status: Former Smoker    Packs/day: 1.00    Years: 3.00    Pack years: 3.00    Types: Cigarettes    Quit date: 08/15/1969    Years since quitting: 49.6  . Smokeless tobacco: Never Used  Substance and Sexual Activity  .  Alcohol use: No    Alcohol/week: 0.0 standard drinks  . Drug use: No  . Sexual activity: Not Currently  Lifestyle  . Physical activity    Days per week: 0 days    Minutes per session: 0 min  . Stress: Not at all  Relationships  . Social connections    Talks on phone: More than three times a week    Gets together: More than three times a week    Attends religious service: More than 4 times per year    Active member of club or organization: Yes    Attends meetings of clubs or organizations: More than 4 times per year    Relationship status: Married  . Intimate partner violence    Fear of current or ex partner: No    Emotionally abused: No    Physically abused: No    Forced sexual activity: No  Other Topics Concern  . Not on file  Social History Narrative  . Not on file   Family History  Problem Relation Age of Onset  . Prostate cancer Father        d.89 diagnosed in late-60s  . Colon cancer Father        diagnosed at 75  . Heart failure Mother        d.37  . Heart disease Mother   . Colon polyps Brother        polyposis. Underwent partial colectomy at age 72.  Marland Kitchen Spina bifida Brother        died at birth  . Stomach cancer Paternal Aunt        questionable  . Kidney cancer Cousin        d.78s maternal  first-cousin  . Other Other 16       multiple lipomas  . Colon cancer Paternal Uncle        in his 26s  . Heart failure Maternal Grandmother        d.45  . Heart failure Maternal Uncle        d.50  . Colon polyps Son 52  . Heart attack Son 45  . Leukemia Maternal Aunt   . Esophageal cancer Neg Hx   . Rectal cancer Neg Hx     Objective: Office vital signs reviewed. BP 128/65   Pulse 72   Temp 98.7 F (37.1 C) (Temporal)   Ht _0  (1.549 m)   Wt 218 lb (98.9 kg)   BMI 41.19 kg/m   Physical Examination:  General: Awake, alert, obese, No acute distress HEENT: Normal, sclera white. Cardio: regular rate and rhythm, S1S2 heard, no murmurs appreciated Pulm: clear to auscultation bilaterally, no wheezes, rhonchi or rales; normal work of breathing on room air Neuro: No significant tremor appreciated on today's exam. Psych: Mood stable, speech normal, pleasant interactive. Depression screen [facility] 2/9 04/02/2019 01/21/2019 10/09/2018  Decreased Interest - 0 0  Down, Depressed, Hopeless 0 0 0  PHQ - 2 Score 0 0 0  Altered sleeping 0 - 1  Tired, decreased energy 0 - 1  Change in appetite 0 - 0  Feeling bad or failure about yourself  0 - 0  Trouble concentrating 0 - 0  Moving slowly or fidgety/restless 0 - 0  Suicidal thoughts 0 - 0  PHQ-9 Score 0 - 2   Assessment/ Plan: 73 y.o. female   1. Type 2 diabetes mellitus without complication, without long-term current use of insulin (HCC) Under excellent control.  Continue current regimen -  Bayer DCA Hb A1c Waived  2. Hypertension associated with diabetes ([place]) Controlled.  Continue current regimen - benazepril (LOTENSIN) 40 MG tablet; Take 1 tablet (40 mg total) by mouth daily.  Dispense: 90 tablet; Refill: 1  3. Hyperlipidemia associated with type 2 diabetes mellitus (HCC) Continue statin.  Plan for fasting lipid panel at next visit - atorvastatin (LIPITOR) 40 MG tablet; Take 1 tablet (40 mg total) by mouth daily.  Dispense: 90  tablet; Refill: 1  4. Gastroesophageal reflux disease without esophagitis Stable.  Continue PPI - omeprazole (PRILOSEC) 40 MG capsule; Take 1 capsule (40 mg total) by mouth daily.  Dispense: 90 capsule; Refill: 1  5. Chronic left-sided low back pain with left-sided sciatica National cardiac database was reviewed and there were no red flags.  Continue PRN use of tramadol.  Caution if need for use with Xanax.  She understands that she is to take these totally separately - traMADol (ULTRAM) 50 MG tablet; Take 1 tablet (50 mg total) by mouth every 12 (twelve) hours as needed for moderate pain.  Dispense: 30 tablet; Refill: 1 - ToxASSU[employer] Select 13 (MW), Urine  6. GAD (generalized anxiety disorder) Rare use of Xanax.  Has not had a refill since June of last year.  She understands the risks of this medication, particularly given use of tramadol.  I think that her usage is appropriate and therefore we have renewed her medication.  Check UDS.  Controlled substance contract updated - ALPRAZolam (XANAX) 0.25 MG tablet; Take 1 tablet (0.25 mg total) by mouth 2 (two) times daily as needed for anxiety.  Dispense: 30 tablet; Refill: 0 - ToxASSURE Select 13 (MW), Urine  7. Controlled substance agreement signed As above - ToxASSURE Select 13 (MW), Urine  8. Tremor of both hands I did not appreciate a significant tremor during today's visit.  I have placed a referral to neurology per her request to have this evaluated. - Ambulatory referral to Neurology    No orders of the defined types were placed in this encounter.  No orders of the defined types were placed in this encounter.    Janora Norlander, DO [place] [phone]

## 2019-04-02 NOTE — Patient Instructions (Addendum)
Schedule your diabetic eye exam and have them send me the results.  Flu shots will be available in October.  Please schedule your flu appointment.   Tremor A tremor is trembling or shaking that you cannot control. Most tremors affect the hands or arms. Tremors can also affect the head, vocal cords, face, and other parts of the body. There are many types of tremors. Common types include:  Essential tremor. These usually occur in people older than 40. It may run in families and can happen in otherwise healthy people.  Resting tremor. These occur when the muscles are at rest, such as when your hands are resting in your lap. People with Parkinson's disease often have resting tremors.  Postural tremor. These occur when you try to hold a pose, such as keeping your hands outstretched.  Kinetic tremor. These occur during purposeful movement, such as trying to touch a finger to your nose.  Task-specific tremor. These may occur when you perform certain tasks such as writing, speaking, or standing.  Psychogenic tremor. These dramatically lessen or disappear when you are distracted. They can happen in people of all ages. Some types of tremors have no known cause. Tremors can also be a symptom of nervous system problems (neurological disorders) that may occur with aging. Some tremors go away with treatment, while others do not. Follow these instructions at home: Lifestyle      Limit alcohol intake to no more than 1 drink a day for nonpregnant women and 2 drinks a day for men. One drink equals 12 oz of beer, 5 oz of wine, or 1 oz of hard liquor.  Do not use any products that contain nicotine or tobacco, such as cigarettes and e-cigarettes. If you need help quitting, ask your health care provider.  Avoid extreme heat and extreme cold.  Limit your caffeine intake, as told by your health care provider.  Try to get 8 hours of sleep each night.  Find ways to manage your stress, such as meditation  or yoga. General instructions  Take over-the-counter and prescription medicines only as told by your health care provider.  Keep all follow-up visits as told by your health care provider. This is important. Contact a health care provider if you:  Develop a tremor after starting a new medicine.  Have a tremor along with other symptoms such as: ? Numbness. ? Tingling. ? Pain. ? Weakness.  Notice that your tremor gets worse.  Notice that your tremor interferes with your day-to-day life. Summary  A tremor is trembling or shaking that you cannot control.  Most tremors affect the hands or arms.  Some types of tremors have no known cause. Others may be a symptom of nervous system problems (neurological disorders).  Make sure you discuss any tremors you have with your health care provider. This information is not intended to replace advice given to you by your health care provider. Make sure you discuss any questions you have with your health care provider. Document Released: 07/22/2002 Document Revised: 07/14/2017 Document Reviewed: 06/01/2017 Elsevier Patient Education  2020 [employer].

## 2019-04-05 LAB — TOXASSURE SELECT 13 (MW), URINE

## 2019-05-06 ENCOUNTER — Telehealth: Payer: Self-pay | Admitting: Gastroenterology

## 2019-05-06 NOTE — Telephone Encounter (Signed)
#  [phone] [name] McDaniel---Daughter) Pt's daughter is calling about scheduling her followup CT scan and has questions about her cirrhosis and medications

## 2019-05-07 ENCOUNTER — Ambulatory Visit (INDEPENDENT_AMBULATORY_CARE_PROVIDER_SITE_OTHER): Payer: PPO | Admitting: Neurology

## 2019-05-07 ENCOUNTER — Other Ambulatory Visit: Payer: Self-pay

## 2019-05-07 ENCOUNTER — Encounter: Payer: Self-pay | Admitting: Neurology

## 2019-05-07 VITALS — BP 163/74 | HR 70 | Ht 61.5 in | Wt 220.0 lb

## 2019-05-07 DIAGNOSIS — R410 Disorientation, unspecified: Secondary | ICD-10-CM | POA: Diagnosis not present

## 2019-05-07 DIAGNOSIS — K7460 Unspecified cirrhosis of liver: Secondary | ICD-10-CM

## 2019-05-07 DIAGNOSIS — R251 Tremor, unspecified: Secondary | ICD-10-CM

## 2019-05-07 DIAGNOSIS — K769 Liver disease, unspecified: Secondary | ICD-10-CM

## 2019-05-07 NOTE — Telephone Encounter (Signed)
Pt's daughter called asking to speak with you, she stated that when you called her she was with her mother at a doctor's appt so she could not pay fully attention to you so she would like to speak with you again.

## 2019-05-07 NOTE — Patient Instructions (Signed)
You Do not currently have a tremor.  I Would not recommend any symptomatic treatment for tremors given your liver disease and the fact that the tremor comes and goes. Tremors can result from dehydration, lack of sleep, stress, blood sugar fluctuations and can be seen with untreated liver disease if liver function deteriorates.   I also do not see any signs or symptoms of parkinson's like disease or what we call parkinsonism.   We will proceed with blood work today. I will also check your ammonia level since you have not been taking her lactulose on a regular basis.   Given your recent episode of confusion, I would like to order an EEG (brainwave test), which we will schedule. We will call you with the results.  We will also do a brain scan, called MRI and call you with the test results. We will have to schedule you for this on a separate date. This test requires authorization from your insurance, and we will take care of the insurance process. So long as your test results are reassuring, I can see you back on an as-needed basis.

## 2019-05-07 NOTE — Telephone Encounter (Signed)
Yes, she needs to stay on lactulose 15 cc 2-3 times daily with goal 2-3 soft bowel movements.  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  If her mental status continues to deteriorate, please advise family to bring her to the ER

## 2019-05-07 NOTE — Progress Notes (Signed)
Subjective:    Patient ID: Stacey Summers is a 73 y.o. female.  HPI     Star Age, MD, PhD [facility] [address] [place], [place] [address]  Dear Dr. [name], I saw your patient, Stacey Summers, upon your kind request to my neurologic clinic today for initial consultation of her tremors.  The patient is accompanied by her daughter today.  As you know, Stacey Summers is a 73 year old right-handed woman with an underlying complex medical history of allergies, anxiety, arthritis, chronic back pain, history of liver disease, COPD, diabetes, fibromyalgia, reflux disease, hearing loss, hypertension, hyperlipidemia, obstructive sleep apnea (on CPAP per daughter), hearing loss, and morbid obesity with a BMI of over 40, who reports a bilateral upper extremity tremor for the past several months, perhaps worse over the past 2 months, unclear when the tremor started.  It is intermittent and mostly in her hands, mostly when she tries to hold something or write something.  Her daughter also reports a recent episode of confusion this past weekend, the patient was disoriented and confused, had more shaking, was able to move independently but the family ended up calling 911.  EMS came and checked her out but did not take her to the ER.  She had dozed off by the time EMS came and did not have any recollection of her confusion/disorientation.  Of note, the patient has not been taking her lactulose.  She believes that she has not been taking it for about a month, her daughter suspects that it could have been longer.  They have an appointment with the GI specialist in November.  She reports that the lactulose has been giving her loose stools and she stopped taking it.  She lives with her husband and 1 of her sons.  She has a total of 2 sons and 1 daughter, their daughter lives about an hour away, the other son about 10 minutes away.  Patient does not have a family  history of tremors or Parkinson's disease, her mother died at age 67, father died at age 9.  Her only brother died last year at the age of 16.  She has had low back pain and bilateral knee pain.  She had injections into the knees which did not help very much. I reviewed your office note from 04/02/2019.  Of note, she is on multiple medications including potentially sedating medications such as Xanax, Flexeril, meclizine, and tramadol.  She takes these medications as needed, in fact, she takes them rarely per daughter.  She has not been eating very well and daughter reports that she did not eat all day this past weekend.  She does apparently hydrate well with water.  She is a non-smoker and does not drink alcohol and does not drink caffeine daily.  She had recent blood work which I reviewed.  A1c in August was 6.2, she had blood work in July which indicated slight elevation in her AST, normal ALT. TSH in May 2020 was normal.  Her Past Medical History Is Significant For: Past Medical History:  Diagnosis Date  . Allergy    seasonal  . Anxiety   . Arthritis    hands, back   . Cataract    surgery to remove  . Chronic back pain    lower back   . Cirrhosis of liver without ascites ([place])    recent dx 07/11/18  . COPD (chronic obstructive pulmonary disease) (HCC)    no inhaler  .  Diabetes mellitus without complication ([place])   . DJD (degenerative joint disease)   . Fibromyalgia   . Genetic testing 03/08/2017   Stacey Summers underwent genetic counseling and testing for hereditary cancer syndromes on 02/21/2017. Her results were negative for mutations in all 83 genes analyzed by [facility]'s 83-gene Multi-Cancers Panel. Genes analyzed include: ALK, APC, ATM, AXIN2, BAP1, BARD1, BLM, BMPR1A, BRCA1, BRCA2, BRIP1, CASR, CDC73, CDH1, CDK4, CDKN1B, CDKN1C, CDKN2A, CEBPA, CHEK2, CTNNA1, DICER1, DIS3L2, EGFR, EPCAM  . GERD (gastroesophageal reflux disease)   . 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  History of abnormal mammogram   . Hyperlipidemia   . Hypertension   . Irregular heart beat   . Neuromuscular disorder (HCC)    fibromyalgia  . Sleep apnea    cpap some  . SVD (spontaneous vaginal delivery)    x 2  . Tubular adenoma of colon 05/09/12   "innumerable number" of flat polyps    Her Past Surgical History Is Significant For: Past Surgical History:  Procedure Laterality Date  . CARPAL TUNNEL RELEASE Left 10/20/14   [facility]  . CATARACT EXTRACTION, BILATERAL    . [place]  . CHOLECYSTECTOMY  1990  . COLONOSCOPY    . KNEE ARTHROSCOPY WITH SUBCHONDROPLASTY Right 03/27/2018   Procedure: Right knee arthroscopic partial medial meniscus repair versus menisectomy with medial tibia subchondroplasty;  Surgeon: Nicholes Stairs, MD;  Location: [facility];  Service: Orthopedics;  Laterality: Right;  120 mins  . LARYNX SURGERY     POLYP REMOVAL  . MULTIPLE TOOTH EXTRACTIONS    . POLYPECTOMY     vocal cord  . TONSILLECTOMY  1965  . TUBAL LIGATION    . TUMOR REMOVAL     benign fatty tumors on abdomen  . UPPER GASTROINTESTINAL ENDOSCOPY      Her Family History Is Significant For: Family History  Problem Relation Age of Onset  . Prostate cancer Father        d.89 diagnosed in late-60s  . Colon cancer Father        diagnosed at 67  . Heart failure Mother        d.37  . Heart disease Mother   . Colon polyps Brother        polyposis. Underwent partial colectomy at age 64.  Marland Kitchen Spina bifida Brother        died at birth  . Stomach cancer Paternal Aunt        questionable  . Kidney cancer Cousin        d.78s maternal first-cousin  . Other Other 16       multiple lipomas  . Colon cancer Paternal Uncle        in his 60s  . Heart failure Maternal Grandmother        d.45  . Heart failure Maternal Uncle        d.50  . Colon polyps Son 18  . Heart attack Son 16  . Leukemia Maternal Aunt   . Esophageal cancer Neg Hx   . Rectal cancer Neg Hx     Her Social  History Is Significant For: Social History   Socioeconomic History  . Marital status: Married    Spouse name: [name]  . Number of children: 3  . Years of education: 55  . Highest education level: High school graduate  Occupational History  . Occupation: retired  Scientific laboratory technician  . Financial resource strain: Not hard at  all  . Food insecurity    Worry: Never true    Inability: Never true  . Transportation needs    Medical: No    Non-medical: No  Tobacco Use  . Smoking status: Former Smoker    Packs/day: 1.00    Years: 3.00    Pack years: 3.00    Types: Cigarettes    Quit date: 08/15/1969    Years since quitting: 49.7  . Smokeless tobacco: Never Used  Substance and Sexual Activity  . Alcohol use: No    Alcohol/week: 0.0 standard drinks  . Drug use: No  . Sexual activity: Not Currently  Lifestyle  . Physical activity    Days per week: 0 days    Minutes per session: 0 min  . Stress: Not at all  Relationships  . Social connections    Talks on phone: More than three times a week    Gets together: More than three times a week    Attends religious service: More than 4 times per year    Active member of club or organization: Yes    Attends meetings of clubs or organizations: More than 4 times per year    Relationship status: Married  Other Topics Concern  . Not on file  Social History Narrative  . Not on file    Her Allergies Are:  Allergies  Allergen Reactions  . Latex     "my hands feels numb"  :   Her Current Medications Are:  Outpatient Encounter Medications as of 05/07/2019  Medication Sig  . ALPRAZolam (XANAX) 0.25 MG tablet Take 1 tablet (0.25 mg total) by mouth 2 (two) times daily as needed for anxiety.  Marland Kitchen atorvastatin (LIPITOR) 40 MG tablet Take 1 tablet (40 mg total) by mouth daily.  . benazepril (LOTENSIN) 40 MG tablet Take 1 tablet (40 mg total) by mouth daily.  . Blood Glucose Monitoring Suppl (LIBERTY BLOOD GLUCOSE MONITOR) DEVI 1 each by Does not apply  route daily. Test 1x per day and prn-dx250.02  . Cholecalciferol (VITAMIN D3) 5000 units CAPS Take 5,000 Units by mouth daily.  . cyclobenzaprine (FLEXERIL) 5 MG tablet TAKE 1 TABLET BY MOUTH THREE TIMES DAILY AS NEEDED FOR MUSCLE SPASMS (NEEDS APPOINTMENT)  . diltiazem (DILTIAZEM CD) 120 MG 24 hr capsule Take 1 capsule (120 mg total) by mouth daily.  . furosemide (LASIX) 40 MG tablet TAKE 1 BY MOUTH EVERY MORNING AND 1/2 BY MOUTH AT 1 IN THE AFTERNOON  . glucose blood test strip Test 1x per day and prn- dx 250.02  . lactulose (CHRONULAC) 10 GM/15ML solution Take 15 mLs (10 g total) by mouth daily.  . meclizine (ANTIVERT) 25 MG tablet Take 1 tablet (25 mg total) by mouth 3 (three) times daily as needed for dizziness.  . metFORMIN (GLUCOPHAGE) 500 MG tablet Take 1 tablet (500 mg total) by mouth 2 (two) times daily with a meal. (Patient taking differently: Take 500 mg by mouth daily. )  . omeprazole (PRILOSEC) 40 MG capsule Take 1 capsule (40 mg total) by mouth daily.  . ondansetron (ZOFRAN ODT) 4 MG disintegrating tablet Take 1 tablet (4 mg total) by mouth every 8 (eight) hours as needed.  . ONE TOUCH ULTRA TEST test strip USE ONE STRIP TO CHECK GLUCOSE ONCE DAILY  . ONETOUCH DELICA LANCETS 45Y MISC USE ONE LANCET TO CHECK GLUCOSE ONCE DAILY AND AS NEEDED  . traMADol (ULTRAM) 50 MG tablet Take 1 tablet (50 mg total) by mouth every 12 (twelve)  hours as needed for moderate pain.  Marland Kitchen UNABLE TO FIND Place 1 drop under the tongue. Med Name: Hemp Oil, One drop daily under tongue   No facility-administered encounter medications on file as of 05/07/2019.   : Review of Systems:  Out of a complete 14 point review of systems, all are reviewed and negative with the exception of these symptoms as listed below:  Review of Systems  Neurological:       Pt presents today to discuss her intermittent bilateral hand tremors. Pt is right handed.    Objective:  Neurological Exam  Physical Exam Physical  Examination:   Vitals:   05/07/19 1439  BP: (!) 163/74  Pulse: 70   General Examination: The patient is a very pleasant 73 y.o. female in no acute distress. She appears well-developed and well-nourished and well groomed.   HEENT: Normocephalic, atraumatic, pupils are equal, round and reactive to light and accommodation.  She may have slightly icteric slerae?. Extraocular tracking is good without limitation to gaze excursion or nystagmus noted. Normal smooth pursuit is noted. Hearing is Quite significantly impaired and no hearing aids. Face is symmetric with normal facial animation and normal facial sensation. Speech is clear with no dysarthria noted. There is no hypophonia. There is no lip, neck/head, jaw or voice tremor. Neck is supple with full range of passive and active motion. There are no carotid bruits on auscultation. Oropharynx exam reveals: moderate mouth dryness, adequate dental hygiene With dentures in place.  Tongue protrudes centrally in palate elevates symmetrically.   Chest: Clear to auscultation without wheezing, rhonchi or crackles noted.  Heart: S1+S2+0, regular and normal without murmurs, rubs or gallops noted.   Abdomen: Soft, non-tender and non-distended with normal bowel sounds appreciated on auscultation.  Extremities: There is 1+ pitting edema in the distal lower extremities bilaterally. Pedal pulses are intact.  Skin: Warm and dry without trophic changes noted.  Musculoskeletal: exam reveals no obvious joint deformities, tenderness or joint swelling or erythema.   Neurologically:  Mental status: The patient is awake, alert and oriented in all 4 spheres. Her immediate and remote memory, attention, language skills and fund of knowledge are Mildly impaired, difficult to assess as the patient is very hard of hearing. History is primarily provided by her daughter. Cranial nerves II - XII are as described above under HEENT exam. In addition: shoulder shrug is normal with  equal shoulder height noted. Motor exam: Normal bulk, strength and tone is noted. There is no drift, resting tremor or rebound. Romberg is not tested due to safety concerns.  On Archimedes spiral drawing she has slight insecurity with her left hand which is her nondominant hand, no tremor noted with the right hand, handwriting is legible, not tremulous, not particularly micrographic.  She has no obvious asterixis, no postural or action tremor in the upper extremities, no lower extremity tremor.  Reflexes are 1+ in the upper extremities, absent in the lower extremities, toes are downgoing.  Fine motor skills are intact with finger taps and hand movements. Foot agility is unremarkable, she has decreased range of motion in both knees andReports low back pain. Cerebellar testing: No dysmetria or intention tremor on finger to nose testing. Heel to shin is Difficult for her secondary to bilateral knee pain but no obvious dysmetria noted. There is no truncal or gait ataxia.  Sensory exam: intact to light touch.  Gait, station and balance: She standsWith difficulty and pushes herself up.  She stands slightly wide-based.  She has  an increase in lumbar kyphosis.  She reports bilateral knee pain.  She walks with a limp on the left side, she has a single-point cane but canTake a few steps without it, no shuffling, preserved arm swing noted.  Assessment and Plan:   In summary, ESHAL PROPPS is a very pleasant 73 y.o.-year old female with an underlying complex medical history of allergies, anxiety, arthritis, chronic back pain, history of liver disease, COPD, diabetes, fibromyalgia, reflux disease, hearing loss, hypertension, hyperlipidemia, obstructive sleep apnea (on CPAP per daughter), hearing loss, and morbid obesity with a BMI of over 40, who presents for evaluation of her hand tremors.  She reports intermittent tremors for the past few months.  She does not endorse a family history of Parkinson's disease or  tremors. On examination she does not have any significant tremor today.  In particular, she has no evidence of parkinsonism.  She has no asterixis or myoclonus.  She is largely reassured in that regard.  We talked about tremor triggers including dehydration, stress, anxiety, blood sugar fluctuations, certain medications and Sleep deprivation as potential triggers.  Of note, her daughter was concerned about an episode of confusion this past weekend.  Her family called EMS but she was not taken to the emergency room.  I explained to her that this could have been many reasons including certain medications she may have taken at the time as she is written for Xanax, meclizine, tramadol and Flexeril, these can be potentially sedating especially in combination. But the daughter indicates that the patient does not tend to take any of these medications on a regular basis.  The patient reports that she does not take any pain medication unless she absolutely has to.  She does admit that she has not been taking her lactulose on a regular basis.  I suggested we proceed with further evaluation with EEG, brain MRI with and without contrast and laboratory work-up with rechecking her liver numbers as well as ammonia.  She does have a follow-up with GI pending for November per daughter's report.  As far as the tremor, I did not suggest any new medication especially in light of chronic liver disease we should be cautious with any new medications I explained to them.  In addition, there is no obvious tremor today.We will keep him posted as to her blood work results, EEG and MRI report and follow-up if needed. As far as her tremor, I can see her back on an as-needed basis.  I answered all their questions today and the patient and her daughter were in agreement.  Thank you very much for allowing me to participate in the care of this nice patient. If I can be of any further assistance to you please do not hesitate to call me at  [phone].  Sincerely,   Star Age, MD, PhD

## 2019-05-07 NOTE — Telephone Encounter (Signed)
I spoke with the pt's daughter and we discussed the information per Dr Silverio Decamp and all questions answered

## 2019-05-07 NOTE — Telephone Encounter (Signed)
The pt's daughter has been advised to have the pt take 15 cc 2-3 times daily and if the pt again has altered mental status she is to go to the ED.  Daughter agrees and verbalized understanding

## 2019-05-07 NOTE — Telephone Encounter (Signed)
Saw Janett Billow last.  Thanks

## 2019-05-07 NOTE — Telephone Encounter (Signed)
The pt's daughter is calling to report that the pt had an episode several weeks ago of confusion.  She was running around the house, speaking incoherently and scared the husband and son.  They called 911 but on arrival the pt was asleep and was not taken to be evaluated.  The daughter called because the pt is telling her that she was told that she did not have to take lactulose any more.  I  advised that there is no mention of Janett Billow telling the pt she does not need to take lactulose.  It remains on her med list and she was told to have her mom take the lactulose as prescribed and I will pass this on to [facility] and Dr Silverio Decamp.  She has a follow up with Dr Silverio Decamp on 11/2 and is seeing neurology tomorrow.  Dr Llana Aliment is out of the office at this time. Can you please review and confirm that the pt needs to be on her lactulose as prescribed?

## 2019-05-08 ENCOUNTER — Telehealth: Payer: Self-pay

## 2019-05-08 ENCOUNTER — Telehealth: Payer: Self-pay | Admitting: Neurology

## 2019-05-08 LAB — COMPREHENSIVE METABOLIC PANEL
ALT: 31 IU/L (ref 0–32)
AST: 52 IU/L — ABNORMAL HIGH (ref 0–40)
Albumin/Globulin Ratio: 1.8 (ref 1.2–2.2)
Albumin: 3.5 g/dL — ABNORMAL LOW (ref 3.7–4.7)
Alkaline Phosphatase: 75 IU/L (ref 39–117)
BUN/Creatinine Ratio: 25 (ref 12–28)
BUN: 19 mg/dL (ref 8–27)
Bilirubin Total: 3 mg/dL — ABNORMAL HIGH (ref 0.0–1.2)
CO2: 24 mmol/L (ref 20–29)
Calcium: 9.1 mg/dL (ref 8.7–10.3)
Chloride: 109 mmol/L — ABNORMAL HIGH (ref 96–106)
Creatinine, Ser: 0.76 mg/dL (ref 0.57–1.00)
GFR calc Af Amer: 91 mL/min/{1.73_m2} (ref >59)
GFR calc non Af Amer: 79 mL/min/{1.73_m2} (ref >59)
Globulin, Total: 2 g/dL (ref 1.5–4.5)
Glucose: 144 mg/dL — ABNORMAL HIGH (ref 65–99)
Potassium: 4.4 mmol/L (ref 3.5–5.2)
Sodium: 145 mmol/L — ABNORMAL HIGH (ref 134–144)
Total Protein: 5.5 g/dL — ABNORMAL LOW (ref 6.0–8.5)

## 2019-05-08 LAB — AMMONIA: Ammonia: 84 ug/dL (ref 31–169)

## 2019-05-08 NOTE — Progress Notes (Signed)
Please update patient regarding her blood work.  Her ammonia level is within range, but her blood sugar level was elevated at 144, borderline elevation in sodium level and chloride levels were noted and one liver enzyme called AST was also higher than before.  I would favor that she consider moving up her follow-up appointment with her liver specialist from November to a sooner appt if possible, Especially if she has not been taking the lactulose as prescribed. From my end of things, we will proceed with brain scan and EEG as planned.

## 2019-05-08 NOTE — Telephone Encounter (Signed)
Noted, thank you

## 2019-05-08 NOTE — Telephone Encounter (Signed)
-----   Message from Star Age, MD sent at 05/08/2019  5:08 PM EDT ----- Please update patient regarding her blood work.  Her ammonia level is within range, but her blood sugar level was elevated at 144, borderline elevation in sodium level and chloride levels were noted and one liver enzyme called AST was also higher than before.  I would favor that she consider moving up her follow-up appointment with her liver specialist from November to a sooner appt if possible, Especially if she has not been taking the lactulose as prescribed. From my end of things, we will proceed with brain scan and EEG as planned.

## 2019-05-08 NOTE — Telephone Encounter (Signed)
FYISharyn Lull from [facility] called and said pt's MRI is scheduled for Tuesday 9/29 at 2:30 pm.

## 2019-05-08 NOTE — Telephone Encounter (Signed)
[facility] called and said pt's MRI is scheduled for Tuesday 9/29 at 2:30 pm.

## 2019-05-08 NOTE — Telephone Encounter (Signed)
health team no auth patient would like an open MRI i informed her the order would be sent to [facility]. they will reach out to the patient to schedule.

## 2019-05-08 NOTE — Telephone Encounter (Signed)
I called pt, had an extended conversation with her regarding her results and recommendations. Pt will call her liver specialist and ask for a sooner appt. Pt verbalized understanding of results. Pt had no questions at this time but was encouraged to call back if questions arise.

## 2019-05-14 DIAGNOSIS — R410 Disorientation, unspecified: Secondary | ICD-10-CM | POA: Diagnosis not present

## 2019-05-14 DIAGNOSIS — R251 Tremor, unspecified: Secondary | ICD-10-CM | POA: Diagnosis not present

## 2019-05-16 ENCOUNTER — Telehealth: Payer: Self-pay

## 2019-05-16 NOTE — Telephone Encounter (Signed)
Received MRI results from [facility].

## 2019-05-16 NOTE — Telephone Encounter (Signed)
I called pt and discussed her MRI results and recommendations with her. Pt will continue with her EEG as scheduled on 05/22/2019. Pt verbalized understanding of results. Pt had no questions at this time but was encouraged to call back if questions arise.

## 2019-05-16 NOTE — Telephone Encounter (Signed)
I called pt to discuss her results. No answer, left a message asking her to call me back.

## 2019-05-16 NOTE — Telephone Encounter (Signed)
I reviewed patient's brain MRI report.  She had a brain MRI with and without contrast through [facility] on 05/14/2019.  Impression: There are several small foci of T2 hyperintensity in the cerebral white matter.  Scattered T2/flair hyperintense white matter lesions are abnormal but nonspecific, and of uncertain clinical significance.  These lesions may be attributed to chronic small vessel ischemia, and are more common in patients with vascular disease risk factors.  However, they may also be seen in the setting of other benign/remote/incidental non--vascular causes (eg prior trauma, inflammation/infection or demyelination). Otherwise no significant abnormality.   Please call patient: her brain scan showed a normal structure of the brain and no signif. volume loss which we call atrophy. There were changes in the deeper structures of the brain, which we call white matter changes or microvascular changes. These were reported as mild in Her case. These are tiny white spots, that occur with time and are seen in a variety of conditions, including with normal aging, chronic hypertension, chronic headaches, especially migraine HAs, chronic diabetes, chronic hyperlipidemia. These are not strokes and no mass or lesion or contrast enhancement was seen which is reassuring. Again, there were no acute findings, such as a stroke, or mass or blood products. No further action is required on this test at this time, other than re-enforcing the importance of good blood pressure control, good cholesterol control, good blood sugar control, and weight management. Please remind patient to keep any upcoming appointments or tests and to call us with any interim questions, concerns, problems or updates  Scheduled for EEG next week.

## 2019-05-21 ENCOUNTER — Encounter: Payer: Self-pay | Admitting: Gastroenterology

## 2019-05-21 ENCOUNTER — Ambulatory Visit (INDEPENDENT_AMBULATORY_CARE_PROVIDER_SITE_OTHER): Payer: PPO | Admitting: Gastroenterology

## 2019-05-21 ENCOUNTER — Other Ambulatory Visit (INDEPENDENT_AMBULATORY_CARE_PROVIDER_SITE_OTHER): Payer: PPO

## 2019-05-21 VITALS — BP 150/62 | HR 64 | Temp 98.2°F | Ht 61.0 in | Wt 220.8 lb

## 2019-05-21 DIAGNOSIS — K7290 Hepatic failure, unspecified without coma: Secondary | ICD-10-CM | POA: Diagnosis not present

## 2019-05-21 DIAGNOSIS — K7581 Nonalcoholic steatohepatitis (NASH): Secondary | ICD-10-CM

## 2019-05-21 DIAGNOSIS — K7460 Unspecified cirrhosis of liver: Secondary | ICD-10-CM | POA: Diagnosis not present

## 2019-05-21 DIAGNOSIS — R791 Abnormal coagulation profile: Secondary | ICD-10-CM | POA: Diagnosis not present

## 2019-05-21 DIAGNOSIS — K7682 Hepatic encephalopathy: Secondary | ICD-10-CM

## 2019-05-21 DIAGNOSIS — D696 Thrombocytopenia, unspecified: Secondary | ICD-10-CM

## 2019-05-21 DIAGNOSIS — K219 Gastro-esophageal reflux disease without esophagitis: Secondary | ICD-10-CM | POA: Diagnosis not present

## 2019-05-21 LAB — CBC WITH DIFFERENTIAL/PLATELET
Basophils Absolute: 0 10*3/uL (ref 0.0–0.1)
Basophils Relative: 0.6 % (ref 0.0–3.0)
Eosinophils Absolute: 0.2 10*3/uL (ref 0.0–0.7)
Eosinophils Relative: 3.4 % (ref 0.0–5.0)
HCT: 38.5 % (ref 36.0–46.0)
Hemoglobin: 13.1 g/dL (ref 12.0–15.0)
Lymphocytes Relative: 45.3 % (ref 12.0–46.0)
Lymphs Abs: 2.2 10*3/uL (ref 0.7–4.0)
MCHC: 34 g/dL (ref 30.0–36.0)
MCV: 95.3 fl (ref 78.0–100.0)
Monocytes Absolute: 0.4 10*3/uL (ref 0.1–1.0)
Monocytes Relative: 9.2 % (ref 3.0–12.0)
Neutro Abs: 2 10*3/uL (ref 1.4–7.7)
Neutrophils Relative %: 41.5 % — ABNORMAL LOW (ref 43.0–77.0)
Platelets: 88 10*3/uL — ABNORMAL LOW (ref 150.0–400.0)
RBC: 4.04 Mil/uL (ref 3.87–5.11)
RDW: 14.2 % (ref 11.5–15.5)
WBC: 4.8 10*3/uL (ref 4.0–10.5)

## 2019-05-21 LAB — COMPREHENSIVE METABOLIC PANEL
ALT: 27 U/L (ref 0–35)
AST: 44 U/L — ABNORMAL HIGH (ref 0–37)
Albumin: 3.6 g/dL (ref 3.5–5.2)
Alkaline Phosphatase: 85 U/L (ref 39–117)
BUN: 25 mg/dL — ABNORMAL HIGH (ref 6–23)
CO2: 29 mEq/L (ref 19–32)
Calcium: 9.3 mg/dL (ref 8.4–10.5)
Chloride: 105 mEq/L (ref 96–112)
Creatinine, Ser: 0.74 mg/dL (ref 0.40–1.20)
GFR: 77.33 mL/min (ref >60.00)
Glucose, Bld: 124 mg/dL — ABNORMAL HIGH (ref 70–99)
Potassium: 3.7 mEq/L (ref 3.5–5.1)
Sodium: 143 mEq/L (ref 135–145)
Total Bilirubin: 2.6 mg/dL — ABNORMAL HIGH (ref 0.2–1.2)
Total Protein: 6.1 g/dL (ref 6.0–8.3)

## 2019-05-21 LAB — PROTIME-INR
INR: 1.2 ratio — ABNORMAL HIGH (ref 0.8–1.0)
Prothrombin Time: 14.4 s — ABNORMAL HIGH (ref 9.6–13.1)

## 2019-05-21 LAB — AMMONIA: Ammonia: 58 umol/L — ABNORMAL HIGH (ref 11–35)

## 2019-05-21 MED ORDER — RIFAXIMIN 550 MG PO TABS: 550.0000 mg | ORAL_TABLET | Freq: Two times a day (BID) | ORAL | 3 refills | Status: DC

## 2019-05-21 NOTE — Patient Instructions (Signed)
You have been scheduled for an abdominal ultrasound at [facility] (1st floor of hospital) on 05/23/2019 at 9am. Please arrive 15 minutes prior to your appointment for registration. Make certain not to have anything to eat or drink after midnight prior to your appointment. Should you need to reschedule your appointment, please contact radiology at [phone]. This test typically takes about 30 minutes to perform.  We have sent your demographic information and a prescription for Xifaxan to [facility]. This pharmacy is able to get medication approved through insurance and get you the lowest copay possible. If you have not heard from them within 1 week, please call our office at [phone] to let us know.  Continue Lactulose   Follow up in 3 months   Go to the basement for labs today  If you are age 46 or older, your body mass index should be between 23-30. Your Body mass index is 41.72 kg/m. If this is out of the aforementioned range listed, please consider follow up with your Primary Care Provider.  If you are age 53 or younger, your body mass index should be between 19-25. Your Body mass index is 41.72 kg/m. If this is out of the aformentioned range listed, please consider follow up with your Primary Care Provider.    I appreciate the  opportunity to care for you  Thank You   Harl Bowie , MD

## 2019-05-21 NOTE — Progress Notes (Addendum)
Stacey Summers    [id number]    08-Jun-1946  Primary Care Physician:Gottschalk, Koleen Distance, DO  Referring Physician: Janora Norlander, DO [place],  [place] [id number]   Chief complaint:  Cirrhosis, hepatic   HPI: 73 year old female with morbid obesity, recently diagnosed with cirrhosis here for follow-up visit.  She is accompanied by her husband and her daughter on conference call on telephone.  Patient had an episode of acute confusion with loss of consciousness for a brief period, she was not taking lactulose as she resumed that she needs to take it only when she does not have a bowel movement or develops constipation. According to her family she has been more somnolent, has been sleeping a lot, occasional tremors and also has extreme fatigue.  She is ordered for MRI brain, EEG and has follow-up appointment with neurology.  Since she started taking lactulose on regular basis, and has not had any episodes of confusion or loss of consciousness.  She unable to take it much as she is having watery loose diarrhea with lactulose. Denies any melena or rectal bleeding.  She has generalized abdominal bloating and discomfort.    Outpatient Encounter Medications as of 05/21/2019  Medication Sig  . ALPRAZolam (XANAX) 0.25 MG tablet Take 1 tablet (0.25 mg total) by mouth 2 (two) times daily as needed for anxiety.  Marland Kitchen atorvastatin (LIPITOR) 40 MG tablet Take 1 tablet (40 mg total) by mouth daily.  . benazepril (LOTENSIN) 40 MG tablet Take 1 tablet (40 mg total) by mouth daily.  . Blood Glucose Monitoring Suppl (LIBERTY BLOOD GLUCOSE MONITOR) DEVI 1 each by Does not apply route daily. Test 1x per day and prn-dx250.02  . Cholecalciferol (VITAMIN D3) 5000 units CAPS Take 5,000 Units by mouth daily.  . cyclobenzaprine (FLEXERIL) 5 MG tablet TAKE 1 TABLET BY MOUTH THREE TIMES DAILY AS NEEDED FOR MUSCLE SPASMS (NEEDS APPOINTMENT)  . furosemide (LASIX) 40 MG tablet TAKE 1 BY  MOUTH EVERY MORNING AND 1/2 BY MOUTH AT 1 IN THE AFTERNOON  . glucose blood test strip Test 1x per day and prn- dx 250.02  . lactulose (CHRONULAC) 10 GM/15ML solution Take 15 mLs (10 g total) by mouth daily.  . meclizine (ANTIVERT) 25 MG tablet Take 1 tablet (25 mg total) by mouth 3 (three) times daily as needed for dizziness.  . metFORMIN (GLUCOPHAGE) 500 MG tablet Take 1 tablet (500 mg total) by mouth 2 (two) times daily with a meal. (Patient taking differently: Take 500 mg by mouth daily. )  . omeprazole (PRILOSEC) 40 MG capsule Take 1 capsule (40 mg total) by mouth daily.  . ondansetron (ZOFRAN ODT) 4 MG disintegrating tablet Take 1 tablet (4 mg total) by mouth every 8 (eight) hours as needed.  . ONE TOUCH ULTRA TEST test strip USE ONE STRIP TO CHECK GLUCOSE ONCE DAILY  . ONETOUCH DELICA LANCETS 69I MISC USE ONE LANCET TO CHECK GLUCOSE ONCE DAILY AND AS NEEDED  . traMADol (ULTRAM) 50 MG tablet Take 1 tablet (50 mg total) by mouth every 12 (twelve) hours as needed for moderate pain.  Marland Kitchen UNABLE TO FIND Place 1 drop under the tongue. Med Name: Hemp Oil, One drop daily under tongue  . diltiazem (DILTIAZEM CD) 120 MG 24 hr capsule Take 1 capsule (120 mg total) by mouth daily. (Patient not taking: Reported on 05/21/2019)   No facility-administered encounter medications on file as of 05/21/2019.  Allergies as of 05/21/2019 - Review Complete 05/21/2019  Allergen Reaction Noted  . Latex  03/27/2018    Past Medical History:  Diagnosis Date  . Allergy    seasonal  . Anxiety   . Arthritis    hands, back   . Cataract    surgery to remove  . Chronic back pain    lower back   . Cirrhosis of liver without ascites ([facility])    recent dx 07/11/18  . COPD (chronic obstructive pulmonary disease) (HCC)    no inhaler  . Diabetes mellitus without complication ([place])   . DJD (degenerative joint disease)   . Fibromyalgia   . Genetic testing 03/08/2017   Ms. Milliman underwent genetic counseling and  testing for hereditary cancer syndromes on 02/21/2017. Her results were negative for mutations in all 83 genes analyzed by [facility]'s 83-gene Multi-Cancers Panel. Genes analyzed include: ALK, APC, ATM, AXIN2, BAP1, BARD1, BLM, BMPR1A, BRCA1, BRCA2, BRIP1, CASR, CDC73, CDH1, CDK4, CDKN1B, CDKN1C, CDKN2A, CEBPA, CHEK2, CTNNA1, DICER1, DIS3L2, EGFR, EPCAM  . GERD (gastroesophageal reflux disease)   . 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 History of abnormal mammogram   . Hyperlipidemia   . Hypertension   . Irregular heart beat   . Neuromuscular disorder (HCC)    fibromyalgia  . Sleep apnea    cpap some  . SVD (spontaneous vaginal delivery)    x 2  . Tubular adenoma of colon 05/09/12   "innumerable number" of flat polyps    Past Surgical History:  Procedure Laterality Date  . CARPAL TUNNEL RELEASE Left 10/20/14   [facility]  . CATARACT EXTRACTION, BILATERAL    . [place]  . CHOLECYSTECTOMY  1990  . COLONOSCOPY    . KNEE ARTHROSCOPY WITH SUBCHONDROPLASTY Right 03/27/2018   Procedure: Right knee arthroscopic partial medial meniscus repair versus menisectomy with medial tibia subchondroplasty;  Surgeon: Nicholes Stairs, MD;  Location: [facility];  Service: Orthopedics;  Laterality: Right;  120 mins  . LARYNX SURGERY     POLYP REMOVAL  . MULTIPLE TOOTH EXTRACTIONS    . POLYPECTOMY     vocal cord  . TONSILLECTOMY  1965  . TUBAL LIGATION    . TUMOR REMOVAL     benign fatty tumors on abdomen  . UPPER GASTROINTESTINAL ENDOSCOPY      Family History  Problem Relation Age of Onset  . Prostate cancer Father        d.89 diagnosed in late-60s  . Colon cancer Father        diagnosed at 43  . Heart failure Mother        d.37  . Heart disease Mother   . Colon polyps Brother        polyposis. Underwent partial colectomy at age 26.  Marland Kitchen Spina bifida Brother        died at birth  . Stomach cancer Paternal Aunt        questionable  . Kidney cancer Cousin        d.78s  maternal first-cousin  . Other Other 16       multiple lipomas  . Colon cancer Paternal Uncle        in his 44s  . Heart failure Maternal Grandmother        d.45  . Heart failure Maternal Uncle        d.50  . Colon polyps Son 1  . Heart attack Son 25  .  Leukemia Maternal Aunt   . Esophageal cancer Neg Hx   . Rectal cancer Neg Hx     Social History   Socioeconomic History  . Marital status: Married    Spouse name: [name]  . Number of children: 3  . Years of education: 90  . Highest education level: High school graduate  Occupational History  . Occupation: retired  Scientific laboratory technician  . Financial resource strain: Not hard at all  . Food insecurity    Worry: Never true    Inability: Never true  . Transportation needs    Medical: No    Non-medical: No  Tobacco Use  . Smoking status: Former Smoker    Packs/day: 1.00    Years: 3.00    Pack years: 3.00    Types: Cigarettes    Quit date: 08/15/1969    Years since quitting: 49.7  . Smokeless tobacco: Never Used  Substance and Sexual Activity  . Alcohol use: No    Alcohol/week: 0.0 standard drinks  . Drug use: No  . Sexual activity: Not Currently  Lifestyle  . Physical activity    Days per week: 0 days    Minutes per session: 0 min  . Stress: Not at all  Relationships  . Social connections    Talks on phone: More than three times a week    Gets together: More than three times a week    Attends religious service: More than 4 times per year    Active member of club or organization: Yes    Attends meetings of clubs or organizations: More than 4 times per year    Relationship status: Married  . Intimate partner violence    Fear of current or ex partner: No    Emotionally abused: No    Physically abused: No    Forced sexual activity: No  Other Topics Concern  . Not on file  Social History Narrative  . Not on file      Review of systems: Review of Systems  Constitutional: Negative for fever and chills.  Positive  for lack of energy HENT: Negative.   Eyes: Negative for blurred vision.  Respiratory: Negative for cough, shortness of breath and wheezing.   Cardiovascular: Negative for chest pain and palpitations.  Gastrointestinal: as per HPI Genitourinary: Negative for dysuria, urgency, frequency and hematuria.  Musculoskeletal: Positive for myalgias, back pain and joint pain.  Skin: Negative for itching and rash.  Neurological: Positive for dizziness, tremors, focal weakness and loss of consciousness.  Endo/Heme/Allergies: Positive for seasonal allergies.  Psychiatric/Behavioral: Negative for depression, suicidal ideas and hallucinations.  Positive for easy bruising all other systems reviewed and are negative.   Physical Exam: Vitals:   05/21/19 1043  BP: (!) 150/62  Pulse: 64  Temp: 98.2 F (36.8 C)   Body mass index is 41.72 kg/m. Gen:      No acute distress HEENT:  EOMI, sclera anicteric Neck:     No masses; no thyromegaly Lungs:    Clear to auscultation bilaterally; normal respiratory effort CV:         Regular rate and rhythm; no murmurs Abd:      + bowel sounds; soft, non-tender; no palpable masses, no distension Ext:    No edema; adequate peripheral perfusion Skin:      Warm and dry; no rash Neuro: alert and oriented x 3 Psych: normal mood and affect  Data Reviewed:  Reviewed labs, radiology imaging, old records and pertinent past GI work up  Assessment and Plan/Recommendations:  73 year old female with morbid obesity, chronic GERD, OSA, COPD, diabetes, hyperlipidemia, newly diagnosed cirrhosis 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 Meld score 14 and child Pugh score B-C  Start rifaximin 550 mg twice daily Continue lactulose 5 cc 2-3 times daily with goal 2-3 bowel movements  If persistent symptoms of hepatic encephalopathy, may consider embolization of the splenorenal shunt  Repeat CBC, CMP, ammonia, PT and INR  EGD: December 2019 with no esophageal or gastric varices  HCC screening: Due for AFP and abdominal ultrasound  History of multiple adenomatous colon polyps due for surveillance colonoscopy December 2022  Discussed dietary modification, avoid high carb diet, artificial sweeteners, soda or high fructose corn syrup. Minimize intake of red meat Small frequent meals  Avoid NSAIDs, alcohol, over-the-counter herbal remedies or hepatotoxins. Okay to use Tylenol up to 2 g daily max in divided doses as needed  Patient is interested in undergoing back surgery, she is at risk for potential further liver decompensation, increased bleeding in the setting of thrombocytopenia/coagulopathy or worsening hepatic encephalopathy in the perioperative period.  40 minutes was spent face-to-face with the patient. Greater than 50% of the time used for counseling as well as treatment plan and follow-up. She and family had multiple questions which were answered to their satisfaction  K. Denzil Magnuson , MD    CC: Janora Norlander, DO

## 2019-05-22 ENCOUNTER — Ambulatory Visit (INDEPENDENT_AMBULATORY_CARE_PROVIDER_SITE_OTHER): Payer: PPO | Admitting: Neurology

## 2019-05-22 ENCOUNTER — Other Ambulatory Visit: Payer: Self-pay

## 2019-05-22 DIAGNOSIS — R251 Tremor, unspecified: Secondary | ICD-10-CM | POA: Diagnosis not present

## 2019-05-22 DIAGNOSIS — K7460 Unspecified cirrhosis of liver: Secondary | ICD-10-CM

## 2019-05-22 DIAGNOSIS — K769 Liver disease, unspecified: Secondary | ICD-10-CM

## 2019-05-22 DIAGNOSIS — R410 Disorientation, unspecified: Secondary | ICD-10-CM | POA: Diagnosis not present

## 2019-05-22 LAB — AFP TUMOR MARKER: AFP-Tumor Marker: 3.8 ng/mL

## 2019-05-22 NOTE — Procedures (Signed)
     History: Abbey Veith is a 73 year old patient with a history of some recent onset of confusion and disorientation.  The patient had some tremor or shaking with the event.  The patient does have a history of liver disease, she is on lactulose, she had not been taking her lactulose at the time of the onset of confusion.  The patient is being evaluated for this event.  This is a routine EEG.  No skull defects are noted.  Medications include alprazolam, Lipitor, Lotensin, vitamin D supplementation, Flexeril, diltiazem, Lasix, lactulose, Antivert, metformin, Prilosec, Zofran, and Ultram.  EEG classification: Dysrhythmia grade 1 generalized  Description of the recording: The background rhythms of this recording consists of a fairly well-modulated medium amplitude background rhythm of 7 Hz that is reactive to eye opening and closure.  As the record progresses, she appears to remain in the waking state throughout the recording.  Hyperventilation is performed and results in a very minimal buildup of background rhythm activity without significant slowing seen.  Photic stimulation is then performed with a mild but bilateral photic driving response.  At no time during the recording does there appear to be evidence of spike or spike wave discharges or evidence of focal slowing.  EKG monitor shows no evidence of cardiac rhythm abnormalities with a heart rate of 66.  Impression: This is an abnormal EEG recording secondary to mild background slowing.  This is a nonspecific recording and can be seen with any process the results in a mild toxic or metabolic encephalopathy or any dementing type illness.  No epileptiform discharges were seen.

## 2019-05-23 ENCOUNTER — Other Ambulatory Visit: Payer: Self-pay

## 2019-05-23 ENCOUNTER — Other Ambulatory Visit (HOSPITAL_COMMUNITY): Payer: Self-pay | Admitting: Family Medicine

## 2019-05-23 ENCOUNTER — Ambulatory Visit (HOSPITAL_COMMUNITY)
Admission: RE | Admit: 2019-05-23 | Discharge: 2019-05-23 | Disposition: A | Discharge status: Home / Self Care | Payer: PPO | Source: Ambulatory Visit | Attending: Gastroenterology | Admitting: Gastroenterology

## 2019-05-23 DIAGNOSIS — K7460 Unspecified cirrhosis of liver: Secondary | ICD-10-CM | POA: Diagnosis not present

## 2019-05-23 DIAGNOSIS — K7290 Hepatic failure, unspecified without coma: Secondary | ICD-10-CM

## 2019-05-23 DIAGNOSIS — K7682 Hepatic encephalopathy: Secondary | ICD-10-CM

## 2019-05-23 DIAGNOSIS — K7581 Nonalcoholic steatohepatitis (NASH): Secondary | ICD-10-CM | POA: Insufficient documentation

## 2019-05-23 DIAGNOSIS — Z1231 Encounter for screening mammogram for malignant neoplasm of breast: Secondary | ICD-10-CM

## 2019-05-23 NOTE — Progress Notes (Signed)
  Please advise patient that her recent EEG or brain wave test showed Mild and nonspecific changes of mild brain wave slowing, no cause for her intermittent trembling, no epileptiform changes. As discussed, she is advised to FU with her PCP at this point.

## 2019-05-27 ENCOUNTER — Telehealth: Payer: Self-pay

## 2019-05-27 NOTE — Telephone Encounter (Signed)
I called pt and discussed her EEG results with her. Pt will follow up with her PCP. Pt verbalized understanding of results. Pt had no questions at this time but was encouraged to call back if questions arise.

## 2019-05-27 NOTE — Telephone Encounter (Signed)
-----   Message from Star Age, MD sent at 05/23/2019  5:39 PM EDT -----  Please advise patient that her recent EEG or brain wave test showed Mild and nonspecific changes of mild brain wave slowing, no cause for her intermittent trembling, no epileptiform changes. As discussed, she is advised to FU with her PCP at this point.

## 2019-06-03 ENCOUNTER — Ambulatory Visit (HOSPITAL_COMMUNITY)
Admission: RE | Admit: 2019-06-03 | Discharge: 2019-06-03 | Disposition: A | Discharge status: Home / Self Care | Payer: PPO | Source: Ambulatory Visit | Attending: Family Medicine | Admitting: Family Medicine

## 2019-06-03 ENCOUNTER — Other Ambulatory Visit: Payer: Self-pay

## 2019-06-03 DIAGNOSIS — Z1231 Encounter for screening mammogram for malignant neoplasm of breast: Secondary | ICD-10-CM | POA: Diagnosis not present

## 2019-06-05 ENCOUNTER — Telehealth: Payer: Self-pay | Admitting: Gastroenterology

## 2019-06-05 NOTE — Telephone Encounter (Signed)
Pls call pt, she said that medications that Dr. Silverio Decamp wanted to prescribe are too expensive.

## 2019-06-05 NOTE — Telephone Encounter (Signed)
[facility] is working on getting her approved for the patient assistance program..for [facility]   Dr Silverio Decamp any recommendations for patient until we can try to get her approved by the Patient Assitance Program for Stacey Summers    She is taking Lactulose as prescribed for now

## 2019-06-05 NOTE — Telephone Encounter (Signed)
Please send referral to IR for consult , possible embolization of spleno renal shunt as it may be primarily contributing to hepatic encephalopathy. Schedule office visit to discuss this further with patient and family if they have questions. Continue Lactulose.  Thanks

## 2019-06-06 NOTE — Telephone Encounter (Signed)
Per patients request wants to discuss referral to IR per Dr Jillyn Hidden request if patient wanted to be seen before referral   Scheduled pt OV for Monday at 9:50am

## 2019-06-07 ENCOUNTER — Telehealth: Payer: Self-pay | Admitting: Gastroenterology

## 2019-06-07 NOTE — Telephone Encounter (Signed)
Appointment 06/10/19 that was made on 06/06/19 after speaking with Robin.

## 2019-06-07 NOTE — Telephone Encounter (Signed)
Okay; thanks.

## 2019-06-10 ENCOUNTER — Ambulatory Visit: Payer: PPO | Admitting: Gastroenterology

## 2019-06-10 ENCOUNTER — Telehealth: Payer: Self-pay | Admitting: Gastroenterology

## 2019-06-10 NOTE — Telephone Encounter (Signed)
Discussed with the patient's daughter.  Today the patient had "a cold." The daughter wanted to err on the side of caution due to the current pandemic.  The patient and her husband are unclear about the purpose of the appointment. Explained the appointment is to discuss possible IR referral for the embolization of the spleno-renal shunt. This may be contributing to the hepatic encephalopathy the patient experiences. Daughter expresses understanding. Rescheduled the appointment. She will contact the PCP if the patient's cold symptoms do not disappear.

## 2019-06-10 NOTE — Telephone Encounter (Signed)
Pt's daughter [name] called to cancel pt's appt for today with Dr. Silverio Decamp due to pt being feeling sick. She would like a call back to discuss the reason for this appt, she is not sure of why her mother needed this appt.

## 2019-06-11 ENCOUNTER — Ambulatory Visit: Payer: Self-pay | Admitting: *Deleted

## 2019-06-11 ENCOUNTER — Ambulatory Visit
Admission: EM | Admit: 2019-06-11 | Discharge: 2019-06-11 | Disposition: A | Discharge status: Home / Self Care | Payer: PPO | Attending: Family Medicine | Admitting: Family Medicine

## 2019-06-11 ENCOUNTER — Other Ambulatory Visit: Payer: Self-pay

## 2019-06-11 DIAGNOSIS — J069 Acute upper respiratory infection, unspecified: Secondary | ICD-10-CM | POA: Diagnosis not present

## 2019-06-11 DIAGNOSIS — R05 Cough: Secondary | ICD-10-CM

## 2019-06-11 DIAGNOSIS — B9789 Other viral agents as the cause of diseases classified elsewhere: Secondary | ICD-10-CM | POA: Diagnosis not present

## 2019-06-11 DIAGNOSIS — K7460 Unspecified cirrhosis of liver: Secondary | ICD-10-CM

## 2019-06-11 DIAGNOSIS — R059 Cough, unspecified: Secondary | ICD-10-CM

## 2019-06-11 DIAGNOSIS — Z20828 Contact with and (suspected) exposure to other viral communicable diseases: Secondary | ICD-10-CM | POA: Diagnosis not present

## 2019-06-11 DIAGNOSIS — Z20822 Contact with and (suspected) exposure to covid-19: Secondary | ICD-10-CM

## 2019-06-11 MED ORDER — BENZONATATE 200 MG PO CAPS: 200.0000 mg | ORAL_CAPSULE | Freq: Three times a day (TID) | ORAL | 0 refills | Status: AC | PRN

## 2019-06-11 NOTE — ED Provider Notes (Signed)
[facility]    CSN: [id number] Arrival date & time: 06/11/19  1413      History   Chief Complaint Chief Complaint  Patient presents with  . Cough  . Sore Throat    HPI Stacey Summers is a 73 y.o. female history of COPD, cirrhosis, DM type II, GERD, hypertension, hyperlipidemia, presenting today for evaluation of cough and sore throat.  Patient had Covid exposure last week.  She developed cough and symptoms over the past 4 days along with her husband.  She denies shortness of breath or chest pain.  Denies any known fevers chills or body aches.  She has had a mild dryness in her throat, but denies any pain.  She has not taken any medicine for her symptoms.  HPI  Past Medical History:  Diagnosis Date  . Allergy    seasonal  . Anxiety   . Arthritis    hands, back   . Cataract    surgery to remove  . Chronic back pain    lower back   . Cirrhosis of liver without ascites ([place])    recent dx 07/11/18  . COPD (chronic obstructive pulmonary disease) (HCC)    no inhaler  . Diabetes mellitus without complication ([place])   . DJD (degenerative joint disease)   . Fibromyalgia   . Genetic testing 03/08/2017   Ms. Cashatt underwent genetic counseling and testing for hereditary cancer syndromes on 02/21/2017. Her results were negative for mutations in all 83 genes analyzed by [facility]'s 83-gene Multi-Cancers Panel. Genes analyzed include: ALK, APC, ATM, AXIN2, BAP1, BARD1, BLM, BMPR1A, BRCA1, BRCA2, BRIP1, CASR, CDC73, CDH1, CDK4, CDKN1B, CDKN1C, CDKN2A, CEBPA, CHEK2, CTNNA1, DICER1, DIS3L2, EGFR, EPCAM  . GERD (gastroesophageal reflux disease)   . 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 History of abnormal mammogram   . Hyperlipidemia   . Hypertension   . Irregular heart beat   . Neuromuscular disorder (HCC)    fibromyalgia  . Sleep apnea    cpap some  . SVD (spontaneous vaginal delivery)    x 2  . Tubular adenoma of colon 05/09/12   "innumerable number" of  flat polyps    Patient Active Problem List   Diagnosis Date Noted  . Hyperlipidemia associated with type 2 diabetes mellitus ([place]) 04/02/2019  . Liver cirrhosis secondary to NASH ([place]) 07/11/2018  . Complex tear of medial meniscus of right knee 03/27/2018  . Stress fracture of right tibia 03/27/2018  . Chronic pain of right knee 12/19/2017  . Genetic testing 03/08/2017  . Restrictive lung disease secondary to obesity 08/04/2015  . Physical deconditioning 08/04/2015  . OSA treated with BiPAP 08/04/2015  . Gastroesophageal reflux disease without esophagitis 12/25/2014  . Low serum vitamin D 05/28/2014  . Osteopenia 05/28/2014  . COPD (chronic obstructive pulmonary disease) ([place]) 01/29/2014  . Dyspnea on exertion 01/07/2014  . Fatigue 03/06/13  . Murmur, cardiac 03/06/2013  . Diabetes ([place]) 03/06/2013  . Morbid obesity ([place]) Mar 06, 2013  . Family history of sudden death 03/06/2013  . Hyperlipidemia Mar 06, 2013  . Hypertension associated with diabetes ([place]) 12/20/2012  . GAD (generalized anxiety disorder) 12/20/2012  . Fibromyalgia muscle pain 12/20/2012    Past Surgical History:  Procedure Laterality Date  . CARPAL TUNNEL RELEASE Left 10/20/14   [facility]  . CATARACT EXTRACTION, BILATERAL    . [place]  . CHOLECYSTECTOMY  1990  . COLONOSCOPY    . KNEE ARTHROSCOPY WITH SUBCHONDROPLASTY Right 03/27/2018  Procedure: Right knee arthroscopic partial medial meniscus repair versus menisectomy with medial tibia subchondroplasty;  Surgeon: Nicholes Stairs, MD;  Location: [facility];  Service: Orthopedics;  Laterality: Right;  120 mins  . LARYNX SURGERY     POLYP REMOVAL  . MULTIPLE TOOTH EXTRACTIONS    . POLYPECTOMY     vocal cord  . TONSILLECTOMY  1965  . TUBAL LIGATION    . TUMOR REMOVAL     benign fatty tumors on abdomen  . UPPER GASTROINTESTINAL ENDOSCOPY      OB History   No obstetric history on file.      Home Medications    Prior to Admission  medications   Medication Sig Start Date End Date Taking? Authorizing Provider  ALPRAZolam (XANAX) 0.25 MG tablet Take 1 tablet (0.25 mg total) by mouth 2 (two) times daily as needed for anxiety. 04/02/19   Janora Norlander, DO  atorvastatin (LIPITOR) 40 MG tablet Take 1 tablet (40 mg total) by mouth daily. 04/02/19   Janora Norlander, DO  benazepril (LOTENSIN) 40 MG tablet Take 1 tablet (40 mg total) by mouth daily. 04/02/19   Janora Norlander, DO  benzonatate (TESSALON) 200 MG capsule Take 1 capsule (200 mg total) by mouth 3 (three) times daily as needed for up to 7 days for cough. 06/11/19 06/18/19  Wieters, Hallie C, PA-C  Blood Glucose Monitoring Suppl (LIBERTY BLOOD GLUCOSE MONITOR) DEVI 1 each by Does not apply route daily. Test 1x per day and prn-dx250.02 02/22/13   Chevis Pretty, FNP  Cholecalciferol (VITAMIN D3) 5000 units CAPS Take 5,000 Units by mouth daily.    [provider]  cyclobenzaprine (FLEXERIL) 5 MG tablet TAKE 1 TABLET BY MOUTH THREE TIMES DAILY AS NEEDED FOR MUSCLE SPASMS (NEEDS APPOINTMENT) 01/22/18   Hassell Done, Mary-Margaret, FNP  diltiazem (DILTIAZEM CD) 120 MG 24 hr capsule Take 1 capsule (120 mg total) by mouth daily. Patient not taking: Reported on 05/21/2019 12/28/18   Almyra Deforest, PA  furosemide (LASIX) 40 MG tablet TAKE 1 BY MOUTH EVERY MORNING AND 1/2 BY MOUTH AT 1 IN THE AFTERNOON 10/09/18   Ronnie Doss M, DO  glucose blood test strip Test 1x per day and prn- dx 250.02 04/25/14   Hassell Done, Mary-Margaret, FNP  lactulose (CHRONULAC) 10 GM/15ML solution Take 15 mLs (10 g total) by mouth daily. 08/07/18   Mauri Pole, MD  meclizine (ANTIVERT) 25 MG tablet Take 1 tablet (25 mg total) by mouth 3 (three) times daily as needed for dizziness. 05/04/17   Hassell Done, Mary-Margaret, FNP  metFORMIN (GLUCOPHAGE) 500 MG tablet Take 1 tablet (500 mg total) by mouth 2 (two) times daily with a meal. Patient taking differently: Take 500 mg by mouth daily.  10/09/18    Janora Norlander, DO  omeprazole (PRILOSEC) 40 MG capsule Take 1 capsule (40 mg total) by mouth daily. 04/02/19   Janora Norlander, DO  ondansetron (ZOFRAN ODT) 4 MG disintegrating tablet Take 1 tablet (4 mg total) by mouth every 8 (eight) hours as needed. 02/26/19   Zehr, Laban Emperor, PA-C  ONE TOUCH ULTRA TEST test strip USE ONE STRIP TO CHECK GLUCOSE ONCE DAILY 03/06/17   Chevis Pretty, FNP  [facility] DELICA LANCETS 27O MISC USE ONE LANCET TO CHECK GLUCOSE ONCE DAILY AND AS NEEDED 10/10/17   Hassell Done, Mary-Margaret, FNP  rifaximin (XIFAXAN) 550 MG TABS tablet Take 1 tablet (550 mg total) by mouth 2 (two) times daily. 05/21/19   Mauri Pole, MD  traMADol (  ULTRAM) 50 MG tablet Take 1 tablet (50 mg total) by mouth every 12 (twelve) hours as needed for moderate pain. 04/02/19   Gottschalk, Leatrice Jewels M, DO  UNABLE TO FIND Place 1 drop under the tongue. Med Name: Hemp Oil, One drop daily under tongue    [provider]    Family History Family History  Problem Relation Age of Onset  . Prostate cancer Father        d.89 diagnosed in late-60s  . Colon cancer Father        diagnosed at 44  . Heart failure Mother        d.37  . Heart disease Mother   . Colon polyps Brother        polyposis. Underwent partial colectomy at age 53.  Marland Kitchen Spina bifida Brother        died at birth  . Stomach cancer Paternal Aunt        questionable  . Kidney cancer Cousin        d.78s maternal first-cousin  . Other Other 16       multiple lipomas  . Colon cancer Paternal Uncle        in his 46s  . Heart failure Maternal Grandmother        d.45  . Heart failure Maternal Uncle        d.50  . Colon polyps Son 18  . Heart attack Son 9  . Leukemia Maternal Aunt   . Esophageal cancer Neg Hx   . Rectal cancer Neg Hx     Social History Social History   Tobacco Use  . Smoking status: Former Smoker    Packs/day: 1.00    Years: 3.00    Pack years: 3.00    Types: Cigarettes    Quit date:  08/15/1969    Years since quitting: 49.8  . Smokeless tobacco: Never Used  Substance Use Topics  . Alcohol use: No    Alcohol/week: 0.0 standard drinks  . Drug use: No     Allergies   Latex   Review of Systems Review of Systems  Constitutional: Negative for activity change, appetite change, chills, fatigue and fever.  HENT: Positive for congestion, rhinorrhea and sore throat. Negative for ear pain, sinus pressure and trouble swallowing.   Eyes: Negative for discharge and redness.  Respiratory: Positive for cough. Negative for chest tightness and shortness of breath.   Cardiovascular: Negative for chest pain.  Gastrointestinal: Negative for abdominal pain, diarrhea, nausea and vomiting.  Musculoskeletal: Negative for myalgias.  Skin: Negative for rash.  Neurological: Negative for dizziness, light-headedness and headaches.     Physical Exam Triage Vital Signs ED Triage Vitals [06/11/19 1439]  Enc Vitals Group     BP      Pulse      Resp      Temp      Temp src      SpO2      Weight      Height      Head Circumference      Peak Flow      Pain Score 0     Pain Loc      Pain Edu?      Excl. in [place]?    No data found.  Updated Vital Signs BP (!) 111/57   Pulse 64   Temp 98.7 F (37.1 C)   Resp 18   SpO2 94%   Visual Acuity Right Eye Distance:   Left  Eye Distance:   Bilateral Distance:    Right Eye Near:   Left Eye Near:    Bilateral Near:     Physical Exam Vitals signs and nursing note reviewed.  Constitutional:      General: She is not in acute distress.    Appearance: She is well-developed.  HENT:     Head: Normocephalic and atraumatic.     Ears:     Comments: Right cerumen impaction, patient hard of hearing  Left canal without erythema and TM pearly grey    Mouth/Throat:     Comments: Oral mucosa pink and moist, no tonsillar enlargement or exudate. Posterior pharynx patent and nonerythematous, no uvula deviation or swelling. Normal phonation.   Eyes:     Conjunctiva/sclera: Conjunctivae normal.  Neck:     Musculoskeletal: Neck supple.  Cardiovascular:     Rate and Rhythm: Normal rate and regular rhythm.     Heart sounds: No murmur.  Pulmonary:     Effort: Pulmonary effort is normal. No respiratory distress.     Breath sounds: Normal breath sounds.     Comments: Breathing comfortably at rest, CTABL, no wheezing, rales or other adventitious sounds auscultated Abdominal:     Palpations: Abdomen is soft.     Tenderness: There is no abdominal tenderness.  Skin:    General: Skin is warm and dry.  Neurological:     Mental Status: She is alert.      UC Treatments / Results  Labs (all labs ordered are listed, but only abnormal results are displayed) Labs Reviewed  NOVEL CORONAVIRUS, NAA    EKG   Radiology No results found.  Procedures Procedures (including critical care time)  Medications Ordered in UC Medications - No data to display  Initial Impression / Assessment and Plan / UC Course  I have reviewed the triage vital signs and the nursing notes.  Pertinent labs & imaging results that were available during my care of the patient were reviewed by me and considered in my medical decision making (see chart for details).     Covid swab pending.  Vital signs stable, lungs clear.  Likely viral etiology, no acute distress at this time.  Recommending continued symptomatic and supportive care.  Push fluids.  Tessalon for cough.Discussed strict return precautions. Patient verbalized understanding and is agreeable with plan.   Final Clinical Impressions(s) / UC Diagnoses   Final diagnoses:  Viral URI with cough  Exposure to COVID-19 virus     Discharge Instructions     COVID swab pending Tessalon as needed for cough Rest and drink plenty of fluids  May try debrox for wax in right ear  Follow up if symptoms worsening, developing increased shortness of breath, weakness, lightheadedness    ED  Prescriptions    Medication Sig Dispense Auth. Provider   benzonatate (TESSALON) 200 MG capsule Take 1 capsule (200 mg total) by mouth 3 (three) times daily as needed for up to 7 days for cough. 28 capsule Wieters, [place] C, PA-C     PDMP not reviewed this encounter.   Wieters, [place] C, PA-C 06/11/19 1622

## 2019-06-11 NOTE — Patient Instructions (Signed)
Follow up plan:  Recommended that Stacey Summers be seen at [facility] today, along with Stacey Summers.   I would recommend Covid testing as well as an assessment and labwork if indicated.   I attempted to reach out to the Urgent Care office but they did not answer my call.   Keep in touch with GI and f/u as planned  RN will f/u by telephone tomorrow   Stacey Summers, BSN, RN-BC [place] / [place] Management Direct Dial: [phone]

## 2019-06-11 NOTE — ED Triage Notes (Signed)
Pt presents with cough and sore throat, positive covid exposure on Saturday

## 2019-06-11 NOTE — Telephone Encounter (Signed)
Okay thank you

## 2019-06-11 NOTE — Discharge Instructions (Signed)
COVID swab pending Tessalon as needed for cough Rest and drink plenty of fluids  May try debrox for wax in right ear  Follow up if symptoms worsening, developing increased shortness of breath, weakness, lightheadedness

## 2019-06-11 NOTE — Chronic Care Management (AMB) (Signed)
   Care Management   Care Coordination Note  06/11/2019 Name: MERIEL KELLIHER MRN: [id number] DOB: [birth year]  Jadda Hunsucker is a 73 year-old, female, primary care patient of Dr [name]. I spoke with Christina's husband, [name], by telephone today. He is concerned about the current state of her health and requested assistance with care coordination. She is eligible for CCM services, but eligibility and full services were not discussed today due to the importance of addressing their current health concerns.   Both [name] and Tahisha have been sick with URI symptoms for several days. Margret mainly complains of a sore throat and cough. 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 That appointment has been cancelled for now because she of her URI symptoms. Robert relayed that Leyli has had some increase in confusion over the past week and is stable for now. Most recent ammonia results were elevated at 58 umol/L on 05/21/2019.   Follow up plan:  Recommended that Latrena be seen at [facility] today, along with [name].   I would recommend Covid testing as well as an assessment and labwork if indicated.   I attempted to reach out to the Urgent Care office but they did not answer my call.   Keep in touch with GI and f/u as planned  RN will f/u by telephone tomorrow   Chong Sicilian, BSN, RN-BC [place] / [place] Management Direct Dial: [phone]

## 2019-06-12 ENCOUNTER — Telehealth: Payer: PPO | Admitting: *Deleted

## 2019-06-13 ENCOUNTER — Telehealth (HOSPITAL_COMMUNITY): Payer: Self-pay | Admitting: Emergency Medicine

## 2019-06-13 LAB — NOVEL CORONAVIRUS, NAA: SARS-CoV-2, NAA: DETECTED — AB

## 2019-06-13 NOTE — Telephone Encounter (Signed)
Patient and several other positive patients all live together in the same home. I called the patient and spoke to everyone on speaker about their test results. Gave family quarantine time periods, offered documentation if needed, they did not have any further questions. Instructed them on s/s to watch out for and when to head to the emergency room for evaluation. Gave my office number for follow up if they need it. Pt and family verbalized understanding, all questions answered. .   

## 2019-06-14 ENCOUNTER — Ambulatory Visit: Payer: PPO | Admitting: *Deleted

## 2019-06-14 DIAGNOSIS — J410 Simple chronic bronchitis: Secondary | ICD-10-CM

## 2019-06-14 DIAGNOSIS — U071 COVID-19: Secondary | ICD-10-CM

## 2019-06-14 DIAGNOSIS — K7581 Nonalcoholic steatohepatitis (NASH): Secondary | ICD-10-CM

## 2019-06-14 DIAGNOSIS — K7460 Unspecified cirrhosis of liver: Secondary | ICD-10-CM

## 2019-06-14 DIAGNOSIS — R059 Cough, unspecified: Secondary | ICD-10-CM

## 2019-06-14 DIAGNOSIS — R05 Cough: Secondary | ICD-10-CM

## 2019-06-14 NOTE — Chronic Care Management (AMB) (Signed)
  Chronic Care Management   Care Coordination Note  06/14/2019 Name: Stacey Summers MRN: [id number] DOB: 25-Mar-1946  I spoke with Stacey Summers's husband, Stacey Summers, regarding her recent Covid-19 diagnosis and her current health. Stacey Summers, and their 71 year-old son that lives with them, were diagnosed with Covid several days after I advised them to have an evaluation for cough and sore throat which was suspicious for Covid. Stacey Summers has a history of hepatic encephalopathy and elevated ammonia levels and is under the care of GI, Dr Silverio Decamp, for this. She had an appt that was rescheduled due to positive Covid screening.   Stacey Summers states that Stacey Summers is more tired than normal and is sleeping more. He is able to wake her and she did sit up to eat breakfast this morning. She did not get out of bed and she went back to sleep after eating.   Plan  I advised him to call 911 if he is unable to wake her at any or if it becomes very difficult to wake her and to seek medical attention for any new or worsening symptoms.  Follow-up with GI as planned and PRN  I will reach out to them and update them on her current health state and concern about need for repeat ammonia testing sooner rather than later   RN CCM will reach out by phone over the next 7 days  Patient has not been offered CCM services yet due to acute illness. I prefer to wait until she is feeling better before discussing any management of her chronic medical conditions.    Chong Sicilian, BSN, RN-BC Embedded Chronic Care Manager [facility] / [place] Management Direct Dial: [phone]

## 2019-06-14 NOTE — Patient Instructions (Signed)
  Plan  I advised him to call 911 if he is unable to wake her at any or if it becomes very difficult to wake her and to seek medical attention for any new or worsening symptoms.  Follow-up with GI as planned and PRN  I will reach out to them and update them on her current health state and concern about need for repeat ammonia testing sooner rather than later   RN CCM will reach out by phone over the next 7 days  Patient has not been offered CCM services yet due to acute illness. I prefer to wait until she is feeling better before discussing any management of her chronic medical conditions.    Chong Sicilian, BSN, RN-BC Embedded Chronic Care Manager [facility] / [place] [facility] Direct Dial: [phone]

## 2019-06-17 ENCOUNTER — Ambulatory Visit: Payer: PPO | Admitting: Gastroenterology

## 2019-06-17 ENCOUNTER — Ambulatory Visit: Payer: Self-pay | Admitting: *Deleted

## 2019-06-17 DIAGNOSIS — U071 COVID-19: Secondary | ICD-10-CM

## 2019-06-17 DIAGNOSIS — K7460 Unspecified cirrhosis of liver: Secondary | ICD-10-CM

## 2019-06-17 NOTE — Patient Instructions (Signed)
  I consulted with Dr Silverio Decamp regarding Stacey Summers's fatigue and lethargy.   Staff Message Response from Dr Silverio Decamp: It could be combination of covid and elevated ammonia in setting of cirrhosis.  Please advise family to give her additional dose of Lactulose as needed and keep her on 15cc lactulose three times daily. If mental status worsens will need to bring her to ER.    Plan: Husband advised of above and agreed to plan Encouraged them to f/u with Dr Silverio Decamp when Covid symptoms resolve   Chong Sicilian, BSN, RN-BC [place] / [place] Management Direct Dial: [phone]

## 2019-06-17 NOTE — Chronic Care Management (AMB) (Signed)
  Chronic Care Management   Care Coordination Note  06/17/2019 Name: Stacey Summers MRN: [id number] DOB: 15-Sep-1945  I consulted with Dr Silverio Decamp regarding Ms Baskins's fatigue and lethargy.   Staff Message Response from Dr Silverio Decamp: It could be combination of covid and elevated ammonia in setting of cirrhosis.  Please advise family to give her additional dose of Lactulose as needed and keep her on 15cc lactulose three times daily. If mental status worsens will need to bring her to ER.    Plan: Husband advised of above and agreed to plan Encouraged them to f/u with Dr Silverio Decamp when Covid symptoms resolve   Chong Sicilian, BSN, RN-BC [place] / [place] Management Direct Dial: [phone]

## 2019-06-26 ENCOUNTER — Other Ambulatory Visit (INDEPENDENT_AMBULATORY_CARE_PROVIDER_SITE_OTHER): Payer: PPO

## 2019-06-26 ENCOUNTER — Other Ambulatory Visit: Payer: Self-pay

## 2019-06-26 ENCOUNTER — Ambulatory Visit (INDEPENDENT_AMBULATORY_CARE_PROVIDER_SITE_OTHER): Payer: PPO | Admitting: Gastroenterology

## 2019-06-26 ENCOUNTER — Encounter: Payer: Self-pay | Admitting: Gastroenterology

## 2019-06-26 VITALS — BP 92/58 | HR 49 | Temp 97.6°F | Ht 62.0 in | Wt 210.0 lb

## 2019-06-26 DIAGNOSIS — K7581 Nonalcoholic steatohepatitis (NASH): Secondary | ICD-10-CM | POA: Diagnosis not present

## 2019-06-26 DIAGNOSIS — K7290 Hepatic failure, unspecified without coma: Secondary | ICD-10-CM

## 2019-06-26 DIAGNOSIS — T82591A Other mechanical complication of surgically created arteriovenous shunt, initial encounter: Secondary | ICD-10-CM | POA: Diagnosis not present

## 2019-06-26 DIAGNOSIS — K7682 Hepatic encephalopathy: Secondary | ICD-10-CM

## 2019-06-26 LAB — COMPREHENSIVE METABOLIC PANEL
ALT: 23 U/L (ref 0–35)
AST: 46 U/L — ABNORMAL HIGH (ref 0–37)
Albumin: 3.1 g/dL — ABNORMAL LOW (ref 3.5–5.2)
Alkaline Phosphatase: 70 U/L (ref 39–117)
BUN: 17 mg/dL (ref 6–23)
CO2: 35 mEq/L — ABNORMAL HIGH (ref 19–32)
Calcium: 8.8 mg/dL (ref 8.4–10.5)
Chloride: 105 mEq/L (ref 96–112)
Creatinine, Ser: 0.91 mg/dL (ref 0.40–1.20)
GFR: 60.9 mL/min (ref >60.00)
Glucose, Bld: 127 mg/dL — ABNORMAL HIGH (ref 70–99)
Potassium: 3.3 mEq/L — ABNORMAL LOW (ref 3.5–5.1)
Sodium: 149 mEq/L — ABNORMAL HIGH (ref 135–145)
Total Bilirubin: 3.3 mg/dL — ABNORMAL HIGH (ref 0.2–1.2)
Total Protein: 5.8 g/dL — ABNORMAL LOW (ref 6.0–8.3)

## 2019-06-26 LAB — CBC WITH DIFFERENTIAL/PLATELET
Basophils Absolute: 0 10*3/uL (ref 0.0–0.1)
Basophils Relative: 0.9 % (ref 0.0–3.0)
Eosinophils Absolute: 0.1 10*3/uL (ref 0.0–0.7)
Eosinophils Relative: 2.5 % (ref 0.0–5.0)
HCT: 39.4 % (ref 36.0–46.0)
Hemoglobin: 13.4 g/dL (ref 12.0–15.0)
Lymphocytes Relative: 31.8 % (ref 12.0–46.0)
Lymphs Abs: 1.7 10*3/uL (ref 0.7–4.0)
MCHC: 34.1 g/dL (ref 30.0–36.0)
MCV: 93.2 fl (ref 78.0–100.0)
Monocytes Absolute: 0.6 10*3/uL (ref 0.1–1.0)
Monocytes Relative: 10.9 % (ref 3.0–12.0)
Neutro Abs: 2.9 10*3/uL (ref 1.4–7.7)
Neutrophils Relative %: 53.9 % (ref 43.0–77.0)
Platelets: 141 10*3/uL — ABNORMAL LOW (ref 150.0–400.0)
RBC: 4.23 Mil/uL (ref 3.87–5.11)
RDW: 14.1 % (ref 11.5–15.5)
WBC: 5.4 10*3/uL (ref 4.0–10.5)

## 2019-06-26 LAB — PROTIME-INR
INR: 1.5 ratio — ABNORMAL HIGH (ref 0.8–1.0)
Prothrombin Time: 16.9 s — ABNORMAL HIGH (ref 9.6–13.1)

## 2019-06-26 LAB — AMMONIA: Ammonia: 41 umol/L — ABNORMAL HIGH (ref 11–35)

## 2019-06-26 NOTE — Patient Instructions (Signed)
Go to the basement for labs today  We will contact you with your IR referral as soon as the appointment becomes available  Continue Lactulose three times a day  Please check to see if you have the paperwork at home for patient assistance program for your [place], if you do contact the office to let us know   If you are age 71 or older, your body mass index should be between 23-30. Your Body mass index is 38.41 kg/m. If this is out of the aforementioned range listed, please consider follow up with your Primary Care Provider.  If you are age 48 or younger, your body mass index should be between 19-25. Your Body mass index is 38.41 kg/m. If this is out of the aformentioned range listed, please consider follow up with your Primary Care Provider.

## 2019-06-26 NOTE — Progress Notes (Signed)
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 She recently recovered from [place]. She continues to have intermittent episodes of confusion, sleeps most of the day per her daughter.  She is taking lactulose but does not take it 3 times a day as it causes severe diarrhea and abdominal cramping, she is not tolerating it well.  Awaiting approval from insurance for Xifaxan. Denies any melena or rectal bleeding. She has decreased appetite, lack of taste and intermittent nausea.  Somewhat better when she takes Zofran. No vomiting, abdominal bloating or weight gain.     Outpatient Encounter Medications as of 06/26/2019  Medication Sig  . ALPRAZolam (XANAX) 0.25 MG tablet Take 1 tablet (0.25 mg total) by mouth 2 (two) times daily as needed for anxiety.  Marland Kitchen atorvastatin (LIPITOR) 40 MG tablet Take 1 tablet (40 mg total) by mouth daily.  . benazepril (LOTENSIN) 40 MG tablet Take 1 tablet (40 mg total) by mouth daily.  . Blood Glucose Monitoring Suppl (LIBERTY BLOOD GLUCOSE MONITOR) DEVI 1 each by Does not apply route daily. Test 1x per day and prn-dx250.02  . Cholecalciferol (VITAMIN D3) 5000 units CAPS Take 5,000 Units by mouth daily.  . cyclobenzaprine (FLEXERIL) 5 MG tablet TAKE 1 TABLET BY MOUTH THREE TIMES DAILY AS NEEDED FOR MUSCLE SPASMS (NEEDS APPOINTMENT)  . diltiazem (DILTIAZEM CD) 120 MG 24 hr capsule Take 1 capsule (120 mg total) by mouth daily. (Patient not taking: Reported on 05/21/2019)  . furosemide (LASIX) 40 MG tablet TAKE 1 BY MOUTH EVERY MORNING AND 1/2 BY MOUTH AT 1 IN THE AFTERNOON  . glucose blood test strip Test 1x per day and prn- dx 250.02  . lactulose  (CHRONULAC) 10 GM/15ML solution Take 15 mLs (10 g total) by mouth daily.  . meclizine (ANTIVERT) 25 MG tablet Take 1 tablet (25 mg total) by mouth 3 (three) times daily as needed for dizziness.  . metFORMIN (GLUCOPHAGE) 500 MG tablet Take 1 tablet (500 mg total) by mouth 2 (two) times daily with a meal. (Patient taking differently: Take 500 mg by mouth daily. )  . omeprazole (PRILOSEC) 40 MG capsule Take 1 capsule (40 mg total) by mouth daily.  . ondansetron (ZOFRAN ODT) 4 MG disintegrating tablet Take 1 tablet (4 mg total) by mouth every 8 (eight) hours as needed.  . ONE TOUCH ULTRA TEST test strip USE ONE STRIP TO CHECK GLUCOSE ONCE DAILY  . ONETOUCH DELICA LANCETS 78M MISC USE ONE LANCET TO CHECK GLUCOSE ONCE DAILY AND AS NEEDED  . rifaximin (XIFAXAN) 550 MG TABS tablet Take 1 tablet (550 mg total) by mouth 2 (two) times daily.  . traMADol (ULTRAM) 50 MG tablet Take 1 tablet (50 mg total) by mouth every 12 (twelve) hours as needed for moderate pain.  Marland Kitchen UNABLE TO FIND Place 1 drop under the tongue. Med Name: Hemp Oil, One drop daily under tongue   No facility-administered encounter medications on file as of 06/26/2019.     Allergies as of 06/26/2019 - Review Complete 06/11/2019  Allergen Reaction Noted  . Latex  03/27/2018    Past Medical History:  Diagnosis Date  . Allergy    seasonal  .  Anxiety   . Arthritis    hands, back   . Cataract    surgery to remove  . Chronic back pain    lower back   . Cirrhosis of liver without ascites ([place])    recent dx 07/11/18  . COPD (chronic obstructive pulmonary disease) (HCC)    no inhaler  . Diabetes mellitus without complication ([place])   . DJD (degenerative joint disease)   . Fibromyalgia   . Genetic testing 03/08/2017   Ms. Fullenwider underwent genetic counseling and testing for hereditary cancer syndromes on 02/21/2017. Her results were negative for mutations in all 83 genes analyzed by [facility]'s 83-gene Multi-Cancers Panel. Genes analyzed  include: ALK, APC, ATM, AXIN2, BAP1, BARD1, BLM, BMPR1A, BRCA1, BRCA2, BRIP1, CASR, CDC73, CDH1, CDK4, CDKN1B, CDKN1C, CDKN2A, CEBPA, CHEK2, CTNNA1, DICER1, DIS3L2, EGFR, EPCAM  . GERD (gastroesophageal reflux disease)   . 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 History of abnormal mammogram   . Hyperlipidemia   . Hypertension   . Irregular heart beat   . Neuromuscular disorder (HCC)    fibromyalgia  . Sleep apnea    cpap some  . SVD (spontaneous vaginal delivery)    x 2  . Tubular adenoma of colon 05/09/12   "innumerable number" of flat polyps    Past Surgical History:  Procedure Laterality Date  . CARPAL TUNNEL RELEASE Left 10/20/14   [facility]  . CATARACT EXTRACTION, BILATERAL    . [place]  . CHOLECYSTECTOMY  1990  . COLONOSCOPY    . KNEE ARTHROSCOPY WITH SUBCHONDROPLASTY Right 03/27/2018   Procedure: Right knee arthroscopic partial medial meniscus repair versus menisectomy with medial tibia subchondroplasty;  Surgeon: Nicholes Stairs, MD;  Location: [facility];  Service: Orthopedics;  Laterality: Right;  120 mins  . LARYNX SURGERY     POLYP REMOVAL  . MULTIPLE TOOTH EXTRACTIONS    . POLYPECTOMY     vocal cord  . TONSILLECTOMY  1965  . TUBAL LIGATION    . TUMOR REMOVAL     benign fatty tumors on abdomen  . UPPER GASTROINTESTINAL ENDOSCOPY      Family History  Problem Relation Age of Onset  . Prostate cancer Father        d.89 diagnosed in late-60s  . Colon cancer Father        diagnosed at 57  . Heart failure Mother        d.37  . Heart disease Mother   . Colon polyps Brother        polyposis. Underwent partial colectomy at age 66.  Marland Kitchen Spina bifida Brother        died at birth  . Stomach cancer Paternal Aunt        questionable  . Kidney cancer Cousin        d.78s maternal first-cousin  . Other Other 16       multiple lipomas  . Colon cancer Paternal Uncle        in his 53s  . Heart failure Maternal Grandmother        d.45  . Heart  failure Maternal Uncle        d.50  . Colon polyps Son 40  . Heart attack Son 57  . Leukemia Maternal Aunt   . Esophageal cancer Neg Hx   . Rectal cancer Neg Hx     Social History   Socioeconomic History  . Marital status: Married  Spouse name: Stacey Summers  . Number of children: 3  . Years of education: 81  . Highest education level: High school graduate  Occupational History  . Occupation: retired  Scientific laboratory technician  . Financial resource strain: Not hard at all  . Food insecurity    Worry: Never true    Inability: Never true  . Transportation needs    Medical: No    Non-medical: No  Tobacco Use  . Smoking status: Former Smoker    Packs/day: 1.00    Years: 3.00    Pack years: 3.00    Types: Cigarettes    Quit date: 08/15/1969    Years since quitting: 49.8  . Smokeless tobacco: Never Used  Substance and Sexual Activity  . Alcohol use: No    Alcohol/week: 0.0 standard drinks  . Drug use: No  . Sexual activity: Not Currently  Lifestyle  . Physical activity    Days per week: 0 days    Minutes per session: 0 min  . Stress: Not at all  Relationships  . Social connections    Talks on phone: More than three times a week    Gets together: More than three times a week    Attends religious service: More than 4 times per year    Active member of club or organization: Yes    Attends meetings of clubs or organizations: More than 4 times per year    Relationship status: Married  . Intimate partner violence    Fear of current or ex partner: No    Emotionally abused: No    Physically abused: No    Forced sexual activity: No  Other Topics Concern  . Not on file  Social History Narrative  . Not on file      Review of systems: Review of Systems  Constitutional: Negative for fever and chills.  Positive for fatigue HENT: Negative.   Eyes: Negative for blurred vision.  Respiratory: Negative for cough, shortness of breath and wheezing.   Cardiovascular: Negative for chest pain  and palpitations.  Gastrointestinal: as per HPI Genitourinary: Negative for dysuria, urgency, frequency and hematuria.  Musculoskeletal: Positive for myalgias, back pain and joint pain.  Skin: Negative for itching and rash.  Neurological: Positive for dizziness, and increased somnolence Endo/Heme/Allergies: Positive for seasonal allergies.  Psychiatric/Behavioral: Negative for depression, suicidal ideas and hallucinations.  All other systems reviewed and are negative.   Physical Exam: Vitals:   06/26/19 1537  BP: (!) 92/58  Pulse: (!) 49  Temp: 97.6 F (36.4 C)   Body mass index is 38.41 kg/m. Gen:      No acute distress HEENT:  EOMI, sclera anicteric Neck:     No masses; no thyromegaly Lungs:    Clear to auscultation bilaterally; normal respiratory effort CV:         Regular rate and rhythm; no murmurs Abd:      + bowel sounds; soft, non-tender; no palpable masses, no distension Ext:    No edema; adequate peripheral perfusion Skin:      Warm and dry; no rash Neuro: alert and oriented x 3.  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  Meld score 16 based on recent labs.  Child Pugh B  Will refer to interventional radiology to consider embolization of the large splenorenal shunt  Continue lactulose 3 times daily with goal of 2-3 soft bowel movements Await insurance approval for Xifaxan, plan to start it once approved by 50 mg twice daily  No evidence of esophageal or gastric varices on EGD December 2019  [facility] screening: Up-to-date Normal AFP 3.23 May 2019.  No  liver lesions concerning for hepatocellular carcinoma ultrasound October 2020.  Prior to that CT abdomen pelvis with contrast November 2019.  40 minutes was spent face-to-face with the patient. Greater than 50% of the time used for counseling as well as treatment plan and follow-up. She had multiple questions which were answered to her satisfaction  K. Denzil Magnuson , MD    CC: Janora Norlander, DO

## 2019-06-27 ENCOUNTER — Telehealth: Payer: Self-pay | Admitting: Gastroenterology

## 2019-06-27 ENCOUNTER — Encounter: Payer: PPO | Admitting: *Deleted

## 2019-06-27 MED ORDER — RIFAXIMIN 550 MG PO TABS: 550.0000 mg | ORAL_TABLET | Freq: Two times a day (BID) | ORAL | 3 refills | Status: DC

## 2019-06-27 MED ORDER — LACTULOSE 10 GM/15ML PO SOLN: 10.0000 g | Freq: Three times a day (TID) | ORAL | 6 refills | Status: DC

## 2019-06-27 NOTE — Telephone Encounter (Signed)
I just sent in another script of Stacey Summers [facility] so [facility] can restart the Patient assistance program for patient

## 2019-06-27 NOTE — Telephone Encounter (Signed)
Sent in updated dosage of lactulose to patients pharmacy

## 2019-06-28 ENCOUNTER — Ambulatory Visit (HOSPITAL_COMMUNITY)
Admission: RE | Admit: 2019-06-28 | Discharge: 2019-06-28 | Disposition: A | Discharge status: Home / Self Care | Payer: PPO | Source: Ambulatory Visit | Attending: Internal Medicine | Admitting: Internal Medicine

## 2019-06-28 ENCOUNTER — Other Ambulatory Visit: Payer: Self-pay

## 2019-06-28 ENCOUNTER — Encounter: Payer: Self-pay | Admitting: *Deleted

## 2019-06-28 ENCOUNTER — Inpatient Hospital Stay (HOSPITAL_COMMUNITY): Payer: PPO | Attending: Hematology

## 2019-06-28 DIAGNOSIS — E785 Hyperlipidemia, unspecified: Secondary | ICD-10-CM | POA: Diagnosis not present

## 2019-06-28 DIAGNOSIS — R161 Splenomegaly, not elsewhere classified: Secondary | ICD-10-CM | POA: Insufficient documentation

## 2019-06-28 DIAGNOSIS — Z8619 Personal history of other infectious and parasitic diseases: Secondary | ICD-10-CM | POA: Diagnosis not present

## 2019-06-28 DIAGNOSIS — R700 Elevated erythrocyte sedimentation rate: Secondary | ICD-10-CM | POA: Diagnosis not present

## 2019-06-28 DIAGNOSIS — J449 Chronic obstructive pulmonary disease, unspecified: Secondary | ICD-10-CM | POA: Insufficient documentation

## 2019-06-28 DIAGNOSIS — M129 Arthropathy, unspecified: Secondary | ICD-10-CM | POA: Insufficient documentation

## 2019-06-28 DIAGNOSIS — Z800 Family history of malignant neoplasm of digestive organs: Secondary | ICD-10-CM | POA: Diagnosis not present

## 2019-06-28 DIAGNOSIS — R5383 Other fatigue: Secondary | ICD-10-CM | POA: Diagnosis not present

## 2019-06-28 DIAGNOSIS — K7460 Unspecified cirrhosis of liver: Secondary | ICD-10-CM | POA: Diagnosis not present

## 2019-06-28 DIAGNOSIS — D696 Thrombocytopenia, unspecified: Secondary | ICD-10-CM | POA: Diagnosis not present

## 2019-06-28 DIAGNOSIS — I10 Essential (primary) hypertension: Secondary | ICD-10-CM | POA: Diagnosis not present

## 2019-06-28 DIAGNOSIS — K219 Gastro-esophageal reflux disease without esophagitis: Secondary | ICD-10-CM | POA: Diagnosis not present

## 2019-06-28 DIAGNOSIS — Z79899 Other long term (current) drug therapy: Secondary | ICD-10-CM | POA: Insufficient documentation

## 2019-06-28 DIAGNOSIS — Z87891 Personal history of nicotine dependence: Secondary | ICD-10-CM | POA: Diagnosis not present

## 2019-06-28 DIAGNOSIS — M797 Fibromyalgia: Secondary | ICD-10-CM | POA: Diagnosis not present

## 2019-06-28 DIAGNOSIS — G4730 Sleep apnea, unspecified: Secondary | ICD-10-CM | POA: Diagnosis not present

## 2019-06-28 DIAGNOSIS — Z9049 Acquired absence of other specified parts of digestive tract: Secondary | ICD-10-CM | POA: Diagnosis not present

## 2019-06-28 DIAGNOSIS — E119 Type 2 diabetes mellitus without complications: Secondary | ICD-10-CM | POA: Diagnosis not present

## 2019-06-28 DIAGNOSIS — F419 Anxiety disorder, unspecified: Secondary | ICD-10-CM | POA: Diagnosis not present

## 2019-06-28 DIAGNOSIS — R911 Solitary pulmonary nodule: Secondary | ICD-10-CM | POA: Diagnosis not present

## 2019-06-28 DIAGNOSIS — R918 Other nonspecific abnormal finding of lung field: Secondary | ICD-10-CM

## 2019-06-28 DIAGNOSIS — R0602 Shortness of breath: Secondary | ICD-10-CM | POA: Insufficient documentation

## 2019-06-28 LAB — COMPREHENSIVE METABOLIC PANEL
ALT: 28 U/L (ref 0–44)
AST: 53 U/L — ABNORMAL HIGH (ref 15–41)
Albumin: 2.9 g/dL — ABNORMAL LOW (ref 3.5–5.0)
Alkaline Phosphatase: 73 U/L (ref 38–126)
Anion gap: 12 (ref 5–15)
BUN: 21 mg/dL (ref 8–23)
CO2: 31 mmol/L (ref 22–32)
Calcium: 9 mg/dL (ref 8.9–10.3)
Chloride: 101 mmol/L (ref 98–111)
Creatinine, Ser: 0.98 mg/dL (ref 0.44–1.00)
GFR calc Af Amer: >60 mL/min (ref >60)
GFR calc non Af Amer: 58 mL/min — ABNORMAL LOW (ref >60)
Glucose, Bld: 178 mg/dL — ABNORMAL HIGH (ref 70–99)
Potassium: 3.1 mmol/L — ABNORMAL LOW (ref 3.5–5.1)
Sodium: 144 mmol/L (ref 135–145)
Total Bilirubin: 3.8 mg/dL — ABNORMAL HIGH (ref 0.3–1.2)
Total Protein: 5.8 g/dL — ABNORMAL LOW (ref 6.5–8.1)

## 2019-06-28 LAB — CBC WITH DIFFERENTIAL/PLATELET
Abs Immature Granulocytes: 0.01 10*3/uL (ref 0.00–0.07)
Basophils Absolute: 0 10*3/uL (ref 0.0–0.1)
Basophils Relative: 1 %
Eosinophils Absolute: 0.1 10*3/uL (ref 0.0–0.5)
Eosinophils Relative: 3 %
HCT: 40.6 % (ref 36.0–46.0)
Hemoglobin: 13.2 g/dL (ref 12.0–15.0)
Immature Granulocytes: 0 %
Lymphocytes Relative: 31 %
Lymphs Abs: 1.5 10*3/uL (ref 0.7–4.0)
MCH: 31.2 pg (ref 26.0–34.0)
MCHC: 32.5 g/dL (ref 30.0–36.0)
MCV: 96 fL (ref 80.0–100.0)
Monocytes Absolute: 0.4 10*3/uL (ref 0.1–1.0)
Monocytes Relative: 9 %
Neutro Abs: 2.8 10*3/uL (ref 1.7–7.7)
Neutrophils Relative %: 56 %
Platelets: 135 10*3/uL — ABNORMAL LOW (ref 150–400)
RBC: 4.23 MIL/uL (ref 3.87–5.11)
RDW: 13.8 % (ref 11.5–15.5)
WBC: 4.9 10*3/uL (ref 4.0–10.5)
nRBC: 0 % (ref 0.0–0.2)

## 2019-06-28 LAB — LACTATE DEHYDROGENASE: LDH: 276 U/L — ABNORMAL HIGH (ref 98–192)

## 2019-06-28 NOTE — Telephone Encounter (Signed)
Patient called saying they lost the form, I will contact [facility] to see if they can mail out to the patient another form to fill out

## 2019-06-28 NOTE — Telephone Encounter (Signed)
Pt's daughter called and stated that pharmacy told her that Doreene Nest has not been received by pharmacy.  Please resend.

## 2019-07-01 ENCOUNTER — Telehealth: Payer: Self-pay | Admitting: Gastroenterology

## 2019-07-01 ENCOUNTER — Telehealth: Payer: Self-pay | Admitting: *Deleted

## 2019-07-01 DIAGNOSIS — K7682 Hepatic encephalopathy: Secondary | ICD-10-CM

## 2019-07-01 DIAGNOSIS — K7581 Nonalcoholic steatohepatitis (NASH): Secondary | ICD-10-CM

## 2019-07-01 DIAGNOSIS — K7460 Unspecified cirrhosis of liver: Secondary | ICD-10-CM

## 2019-07-01 DIAGNOSIS — K7290 Hepatic failure, unspecified without coma: Secondary | ICD-10-CM

## 2019-07-01 MED ORDER — RIFAXIMIN 550 MG PO TABS: 550.0000 mg | ORAL_TABLET | Freq: Two times a day (BID) | ORAL | 3 refills | Status: DC

## 2019-07-01 MED ORDER — ONDANSETRON HCL 4 MG PO TABS: 4.0000 mg | ORAL_TABLET | Freq: Three times a day (TID) | ORAL | 1 refills | Status: DC | PRN

## 2019-07-01 NOTE — Telephone Encounter (Signed)
Referral to IR for Spleno renal shunt embolization  AMB Referral done, waiting on office note

## 2019-07-01 NOTE — Telephone Encounter (Signed)
Dr Silverio Decamp  Patients daughter has a lot of questions and is inquiring about the labs from the other day

## 2019-07-01 NOTE — Telephone Encounter (Signed)
Pt's daughter called to request that xifaxan be sent to [facility] in [place]. She states that Dr. Silverio Decamp wanted pt to begin taking med while she does ppwork with [facility], [facility]. She also stated that Dr. Silverio Decamp was going to send med for nausea, she was not sure of the name of the medication.

## 2019-07-01 NOTE — Telephone Encounter (Signed)
Please send Rx for Zofran 24m daily as needed X 30 tabs. Thank you

## 2019-07-01 NOTE — Telephone Encounter (Signed)
Dr Silverio Decamp Please advise on the nausea med for the patient  Xifaxian sent to [facility] as requested by patient

## 2019-07-02 ENCOUNTER — Telehealth: Payer: Self-pay | Admitting: Gastroenterology

## 2019-07-02 NOTE — Telephone Encounter (Addendum)
Called daughter Stacey Summers. She has not been able to fill out the application for patient assistance for Xifaxan. She is getting a message that patient is not eligible as she is on Medicare.  It costs > $800 out-of-pocket and is not covered by her insurance. Stacey Summers is sleeping most of the day and even when she wakes up usually lays in the couch, has no appetite. She is not eating or drinking much. Discussed labs.  Ammonia is stable.  No significant change in LFT.  INR slightly worse.  Elevated sodium and potassium slightly low. Advised her to encourage patient to eat small frequent meals with high protein, more vegetables and whole grains.  Avoid red meat. Continue lactulose at least 3 times daily with goal of 2-3 soft bowel movements I also discussed regarding home hospice given recent deterioration s/p Covid in her overall function with progressive liver disease/Nash cirrhosis.  She will discuss with rest of her family and call us back if they all agree.  Time spent on call: 29 minutes   Greater than 50% of the time used for counseling  as well as treatment plan and follow-up. She had multiple questions which were answered to her satisfaction, she is very thankful and appreciative.   Stacey Summers , MD [phone]

## 2019-07-02 NOTE — Telephone Encounter (Signed)
Patient has to go online and answer eligibility questions to receive a application  Pt is not eligable because she has medicare   Going to try and get samples

## 2019-07-02 NOTE — Telephone Encounter (Signed)
Ok I will give her a call. Thanks

## 2019-07-03 ENCOUNTER — Telehealth: Payer: Self-pay | Admitting: Nurse Practitioner

## 2019-07-03 NOTE — Telephone Encounter (Signed)
Daughter aware that we do not have any samples.

## 2019-07-04 ENCOUNTER — Other Ambulatory Visit: Payer: Self-pay | Admitting: Family Medicine

## 2019-07-04 ENCOUNTER — Telehealth: Payer: Self-pay | Admitting: Family Medicine

## 2019-07-04 MED ORDER — BLOOD GLUCOSE MONITOR KIT: PACK | 10 refills | Status: DC

## 2019-07-04 NOTE — Telephone Encounter (Signed)
Order printed. Signed, on my desk. Please fax. THanks, WS

## 2019-07-04 NOTE — Telephone Encounter (Signed)
Scripts faxed as requested and aware.

## 2019-07-05 ENCOUNTER — Ambulatory Visit (HOSPITAL_COMMUNITY): Payer: PPO | Admitting: Nurse Practitioner

## 2019-07-05 ENCOUNTER — Inpatient Hospital Stay (HOSPITAL_BASED_OUTPATIENT_CLINIC_OR_DEPARTMENT_OTHER): Payer: PPO | Admitting: Nurse Practitioner

## 2019-07-05 ENCOUNTER — Other Ambulatory Visit: Payer: Self-pay

## 2019-07-05 DIAGNOSIS — D696 Thrombocytopenia, unspecified: Secondary | ICD-10-CM | POA: Insufficient documentation

## 2019-07-05 DIAGNOSIS — R911 Solitary pulmonary nodule: Secondary | ICD-10-CM | POA: Diagnosis not present

## 2019-07-05 NOTE — Assessment & Plan Note (Addendum)
1.Thrombocytopenia: -Initially platelets were 83.  -Likely due from cirrhosis and splenomegaly based on her last CT scan. -CT of abdomen and pelvis on 06/27/2018 showed cirrhosis. 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 There is a 2 mm nodule in the right middle lobe. -Labs on 06/28/2019 showed creatinine 0.89, LDH 276, WBC 4.9, hemoglobin 13.2, platelets 135. -She will follow up in 3 months with labs.  2.Cirrhosis: -CT of abdomen and pelvis on 06/27/2018 showed cirrhosis. -She was referred to GI for further workup.  3.Joint pain: -she had a full work up including SPEP, CRP, ANA, RF which all were negative. -She did have an elevated sed rate of 37. Patient does have a history of fibromyalgia. -she follows up with her PCP.  4.Elevated Bilirubin: -Labs showed elevated bilirubin dating back to 2017. -CT of abdomen on 06/27/2018 showed cirrhosis, splenomegaly, and evidence of cholecystectomy. -Labs on 06/28/2019 showed bilirubin 3.8. -She is being followed closely by GI and is on lactulose.  5.Pulmonary nodule: -CT scan done on 06/27/2018 showed a 2 mm right middle lobe nodule.  -CXR was done on 06/27/2018 and showed no acute process.  - Patient denies smoking. -CT of chest done on, compatible with infectious/inflammatory allergy.  Given the history of recent Covid infection this is likely reflex viral pneumonia.  Also showed mild mediastinal lymphadenopathy, presumably reactive.  Repeat CT recommended. -We will repeat a CT of the chest in 3 months.  6. Health maintenance: -Mammogram done on 06/03/2019 was B-RADS category 1 negative. -Last colonoscopy on 12/06/2016 showed several polyps that were negative for malignancy. -Patient has a family history of colon cancer and has ungone genetic testing which was negative.

## 2019-07-05 NOTE — Progress Notes (Signed)
[address], [place] [address]   CLINIC:  Medical Oncology/Hematology  PCP:  Janora Norlander, DO [place] [address] [phone]   REASON FOR TELEPHONE VISIT: Follow-up for thrombocytopenia  CURRENT THERAPY: Observation   INTERVAL HISTORY:  Ms. Struve 73 y.o. female is called for a telephone visit for thrombocytopenia.  Patient had COVID 3 weeks ago and is recovering from this. She is improving each week. She still sleeps a lot during the day. She denies any easy bruising or bleeding. Denies any nausea, vomiting, or diarrhea. Denies any new pains. Had not noticed any recent bleeding such as epistaxis, hematuria or hematochezia. Denies recent chest pain on exertion, shortness of breath on minimal exertion, pre-syncopal episodes, or palpitations. Denies any numbness or tingling in hands or feet. Denies any recent fevers, infections, or recent hospitalizations. Patient reports appetite at 50% and energy level at 0%.     REVIEW OF SYSTEMS:  Review of Systems  Constitutional: Positive for fatigue.  Respiratory: Positive for shortness of breath.   All other systems reviewed and are negative.    PAST MEDICAL/SURGICAL HISTORY:  Past Medical History:  Diagnosis Date  . Allergy    seasonal  . Anxiety   . Arthritis    hands, back   . Cataract    surgery to remove  . Chronic back pain    lower back   . Cirrhosis of liver without ascites ([facility])    recent dx 07/11/18  . COPD (chronic obstructive pulmonary disease) (HCC)    no inhaler  . Diabetes mellitus without complication ([place])   . DJD (degenerative joint disease)   . Fibromyalgia   . Genetic testing 03/08/2017   Ms. Grandfield underwent genetic counseling and testing for hereditary cancer syndromes on 02/21/2017. Her results were negative for mutations in all 83 genes analyzed by [facility]'s 83-gene Multi-Cancers Panel. Genes analyzed include: ALK, APC, ATM, AXIN2, BAP1,  BARD1, BLM, BMPR1A, BRCA1, BRCA2, BRIP1, CASR, CDC73, CDH1, CDK4, CDKN1B, CDKN1C, CDKN2A, CEBPA, CHEK2, CTNNA1, DICER1, DIS3L2, EGFR, EPCAM  . GERD (gastroesophageal reflux disease)   . 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 History of abnormal mammogram   . Hyperlipidemia   . Hypertension   . Irregular heart beat   . Neuromuscular disorder (HCC)    fibromyalgia  . Sleep apnea    cpap some  . SVD (spontaneous vaginal delivery)    x 2  . Tubular adenoma of colon 05/09/12   "innumerable number" of flat polyps   Past Surgical History:  Procedure Laterality Date  . CARPAL TUNNEL RELEASE Left 10/20/14   [facility]  . CATARACT EXTRACTION, BILATERAL    . [facility]  . CHOLECYSTECTOMY  1990  . COLONOSCOPY    . KNEE ARTHROSCOPY WITH SUBCHONDROPLASTY Right 03/27/2018   Procedure: Right knee arthroscopic partial medial meniscus repair versus menisectomy with medial tibia subchondroplasty;  Surgeon: Nicholes Stairs, MD;  Location: [facility];  Service: Orthopedics;  Laterality: Right;  120 mins  . LARYNX SURGERY     POLYP REMOVAL  . MULTIPLE TOOTH EXTRACTIONS    . POLYPECTOMY     vocal cord  . TONSILLECTOMY  1965  . TUBAL LIGATION    . TUMOR REMOVAL     benign fatty tumors on abdomen  . UPPER GASTROINTESTINAL ENDOSCOPY       SOCIAL HISTORY:  Social History   Socioeconomic History  . Marital status:  Married    Spouse name: [name]  . Number of children: 3  . Years of education: 75  . Highest education level: High school graduate  Occupational History  . Occupation: retired  Scientific laboratory technician  . Financial resource strain: Not hard at all  . Food insecurity    Worry: Never true    Inability: Never true  . Transportation needs    Medical: No    Non-medical: No  Tobacco Use  . Smoking status: Former Smoker    Packs/day: 1.00    Years: 3.00    Pack years: 3.00    Types: Cigarettes    Quit date: 08/15/1969    Years since quitting: 49.9  . Smokeless tobacco:  Never Used  Substance and Sexual Activity  . Alcohol use: No    Alcohol/week: 0.0 standard drinks  . Drug use: No  . Sexual activity: Not Currently  Lifestyle  . Physical activity    Days per week: 0 days    Minutes per session: 0 min  . Stress: Not at all  Relationships  . Social connections    Talks on phone: More than three times a week    Gets together: More than three times a week    Attends religious service: More than 4 times per year    Active member of club or organization: Yes    Attends meetings of clubs or organizations: More than 4 times per year    Relationship status: Married  . Intimate partner violence    Fear of current or ex partner: No    Emotionally abused: No    Physically abused: No    Forced sexual activity: No  Other Topics Concern  . Not on file  Social History Narrative  . Not on file    FAMILY HISTORY:  Family History  Problem Relation Age of Onset  . Prostate cancer Father        d.89 diagnosed in late-60s  . Colon cancer Father        diagnosed at 75  . Heart failure Mother        d.37  . Heart disease Mother   . Colon polyps Brother        polyposis. Underwent partial colectomy at age 86.  Marland Kitchen Spina bifida Brother        died at birth  . Stomach cancer Paternal Aunt        questionable  . Kidney cancer Cousin        d.78s maternal first-cousin  . Other Other 16       multiple lipomas  . Colon cancer Paternal Uncle        in his 35s  . Heart failure Maternal Grandmother        d.45  . Heart failure Maternal Uncle        d.50  . Colon polyps Son 53  . Heart attack Son 29  . Leukemia Maternal Aunt   . Esophageal cancer Neg Hx   . Rectal cancer Neg Hx     CURRENT MEDICATIONS:  Outpatient Encounter Medications as of 07/05/2019  Medication Sig  . ALPRAZolam (XANAX) 0.25 MG tablet Take 1 tablet (0.25 mg total) by mouth 2 (two) times daily as needed for anxiety.  Marland Kitchen atorvastatin (LIPITOR) 40 MG tablet Take 1 tablet (40 mg total)  by mouth daily.  . benazepril (LOTENSIN) 40 MG tablet Take 1 tablet (40 mg total) by mouth daily.  . blood glucose meter kit and  supplies KIT Dispense based on patient and insurance preference. Use up to four times daily as directed. (FOR ICD-9 250.00, 250.01).  . Blood Glucose Monitoring Suppl (LIBERTY BLOOD GLUCOSE MONITOR) DEVI 1 each by Does not apply route daily. Test 1x per day and prn-dx250.02  . Cholecalciferol (VITAMIN D3) 5000 units CAPS Take 5,000 Units by mouth daily.  . cyclobenzaprine (FLEXERIL) 5 MG tablet TAKE 1 TABLET BY MOUTH THREE TIMES DAILY AS NEEDED FOR MUSCLE SPASMS (NEEDS APPOINTMENT)  . diltiazem (DILTIAZEM CD) 120 MG 24 hr capsule Take 1 capsule (120 mg total) by mouth daily.  . furosemide (LASIX) 40 MG tablet TAKE 1 BY MOUTH EVERY MORNING AND 1/2 BY MOUTH AT 1 IN THE AFTERNOON  . glucose blood test strip Test 1x per day and prn- dx 250.02  . lactulose (CHRONULAC) 10 GM/15ML solution Take 15 mLs (10 g total) by mouth 3 (three) times daily.  . meclizine (ANTIVERT) 25 MG tablet Take 1 tablet (25 mg total) by mouth 3 (three) times daily as needed for dizziness.  . metFORMIN (GLUCOPHAGE) 500 MG tablet Take 1 tablet (500 mg total) by mouth 2 (two) times daily with a meal. (Patient taking differently: Take 500 mg by mouth daily. )  . omeprazole (PRILOSEC) 40 MG capsule Take 1 capsule (40 mg total) by mouth daily.  . ondansetron (ZOFRAN ODT) 4 MG disintegrating tablet Take 1 tablet (4 mg total) by mouth every 8 (eight) hours as needed.  . ondansetron (ZOFRAN) 4 MG tablet Take 1 tablet (4 mg total) by mouth every 8 (eight) hours as needed for nausea or vomiting.  . ONE TOUCH ULTRA TEST test strip USE ONE STRIP TO CHECK GLUCOSE ONCE DAILY  . ONETOUCH DELICA LANCETS 47W MISC USE ONE LANCET TO CHECK GLUCOSE ONCE DAILY AND AS NEEDED  . rifaximin (XIFAXAN) 550 MG TABS tablet Take 1 tablet (550 mg total) by mouth 2 (two) times daily.  . traMADol (ULTRAM) 50 MG tablet Take 1 tablet (50  mg total) by mouth every 12 (twelve) hours as needed for moderate pain.  Marland Kitchen UNABLE TO FIND Place 1 drop under the tongue. Med Name: Hemp Oil, One drop daily under tongue   No facility-administered encounter medications on file as of 07/05/2019.     ALLERGIES:  Allergies  Allergen Reactions  . Latex     "my hands feels numb"     Vital signs deferred due to telephone visit.  Physical Exam Deferred due to telephone visit  LABORATORY DATA:  I have reviewed the labs as listed.  CBC    Component Value Date/Time   WBC 4.9 06/28/2019 1204   RBC 4.23 06/28/2019 1204   HGB 13.2 06/28/2019 1204   HGB 13.0 01/01/2019 1425   HCT 40.6 06/28/2019 1204   HCT 38.2 01/01/2019 1425   PLT 135 (L) 06/28/2019 1204   PLT 103 (L) 01/01/2019 1425   MCV 96.0 06/28/2019 1204   MCV 93 01/01/2019 1425   MCH 31.2 06/28/2019 1204   MCHC 32.5 06/28/2019 1204   RDW 13.8 06/28/2019 1204   RDW 12.7 01/01/2019 1425   LYMPHSABS 1.5 06/28/2019 1204   MONOABS 0.4 06/28/2019 1204   EOSABS 0.1 06/28/2019 1204   BASOSABS 0.0 06/28/2019 1204   CMP Latest Ref Rng & Units 06/28/2019 06/26/2019 05/21/2019  Glucose 70 - 99 mg/dL 178(H) 127(H) 124(H)  BUN 8 - 23 mg/dL 21 17 25(H)  Creatinine 0.44 - 1.00 mg/dL 0.98 0.91 0.74  Sodium 135 - 145 mmol/L 144 149(H)  143  Potassium 3.5 - 5.1 mmol/L 3.1(L) 3.3(L) 3.7  Chloride 98 - 111 mmol/L 101 105 105  CO2 22 - 32 mmol/L 31 35(H) 29  Calcium 8.9 - 10.3 mg/dL 9.0 8.8 9.3  Total Protein 6.5 - 8.1 g/dL 5.8(L) 5.8(L) 6.1  Total Bilirubin 0.3 - 1.2 mg/dL 3.8(H) 3.3(H) 2.6(H)  Alkaline Phos 38 - 126 U/L 73 70 85  AST 15 - 41 U/L 53(H) 46(H) 44(H)  ALT 0 - 44 U/L _0 DIAGNOSTIC IMAGING:  I have independently reviewed the CT scans and discussed with the patient.  All questions were answered to patient's stated satisfaction. Encouraged patient to call with any new concerns or questions before his next visit to the cancer center and we can certain see him  sooner, if needed.      ASSESSMENT & PLAN:   Thrombocytopenia ([place]) 1.Thrombocytopenia: -Initially platelets were 83.  -Likely due from cirrhosis and splenomegaly based on her last CT scan. -CT of abdomen and pelvis on 06/27/2018 showed cirrhosis. 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 There is a 2 mm nodule in the right middle lobe. -Labs on 06/28/2019 showed creatinine 0.89, LDH 276, WBC 4.9, hemoglobin 13.2, platelets 135. -She will follow up in 3 months with labs.  2.Cirrhosis: -CT of abdomen and pelvis on 06/27/2018 showed cirrhosis. -She was referred to GI for further workup.  3.Joint pain: -she had a full work up including SPEP, CRP, ANA, RF which all were negative. -She did have an elevated sed rate of 37. Patient does have a history of fibromyalgia. -she follows up with her PCP.  4.Elevated Bilirubin: -Labs showed elevated bilirubin dating back to 2017. -CT of abdomen on 06/27/2018 showed cirrhosis, splenomegaly, and evidence of cholecystectomy. -Labs on 06/28/2019 showed bilirubin 3.8. -She is being followed closely by GI and is on lactulose.  5.Pulmonary nodule: -CT scan done on 06/27/2018 showed a 2 mm right middle lobe nodule.  -CXR was done on 06/27/2018 and showed no acute process.  - Patient denies smoking. -CT of chest done on, compatible with infectious/inflammatory allergy.  Given the history of recent Covid infection this is likely reflex viral pneumonia.  Also showed mild mediastinal lymphadenopathy, presumably reactive.  Repeat CT recommended. -We will repeat a CT of the chest in 3 months.  6. Health maintenance: -Mammogram done on 06/03/2019 was B-RADS category 1 negative. -Last colonoscopy on 12/06/2016 showed several polyps that were negative for malignancy. -Patient has a family history of colon cancer and has ungone genetic testing which was negative.         Orders placed this  encounter:  Orders Placed This Encounter  Procedures  . CT CHEST W CONTRAST  . Lactate dehydrogenase  . CBC with Differential/Platelet  . Comprehensive metabolic panel  . Vitamin B12  . Vitamin D 25 hydroxy      Francene Finders, FNP-C [facility] [phone]

## 2019-07-08 ENCOUNTER — Other Ambulatory Visit: Payer: Self-pay | Admitting: Gastroenterology

## 2019-07-08 DIAGNOSIS — K7581 Nonalcoholic steatohepatitis (NASH): Secondary | ICD-10-CM

## 2019-07-08 NOTE — Telephone Encounter (Signed)
Patients daughter picked up samples today for Stacey Summers and brought in forms for Patient Assistance filled out but did not bring in proof of  income information. Patient said she taled to [facility] and they said she didn't need it for me to submit without it.. So will fax paperwork today   PAP Application faxed today

## 2019-07-08 NOTE — Telephone Encounter (Signed)
Called outpatient IR today to make sure they have seen patients referral, they could not see the referral but will contact the patient today to schedule

## 2019-07-15 NOTE — Telephone Encounter (Signed)
Eliezer Lofts, answered her questions and discussed her concerns.  Stacey Summers is doing much better since starting Xifaxan. Recent complaint of worsening abdominal distention, asked her to check her weight and continue to monitor if has increase in weight to call us back.  Will schedule for ultrasound to check for ascites and diagnostic paracentesis if needed.

## 2019-07-15 NOTE — Telephone Encounter (Signed)
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

## 2019-07-16 ENCOUNTER — Encounter: Payer: Self-pay | Admitting: *Deleted

## 2019-07-16 ENCOUNTER — Ambulatory Visit
Admission: RE | Admit: 2019-07-16 | Discharge: 2019-07-16 | Disposition: A | Discharge status: Home / Self Care | Payer: PPO | Source: Ambulatory Visit | Attending: Gastroenterology | Admitting: Gastroenterology

## 2019-07-16 ENCOUNTER — Other Ambulatory Visit: Payer: Self-pay

## 2019-07-16 DIAGNOSIS — D696 Thrombocytopenia, unspecified: Secondary | ICD-10-CM | POA: Diagnosis not present

## 2019-07-16 DIAGNOSIS — K7581 Nonalcoholic steatohepatitis (NASH): Secondary | ICD-10-CM

## 2019-07-16 HISTORY — PX: IR RADIOLOGIST EVAL & MGMT: IMG5224

## 2019-07-16 NOTE — Consult Note (Signed)
Chief Complaint: Patient was consulted remotely today (TeleHealth) for embolization of splenorenal shunt at the request of Nandigam,Kavitha V.    Referring Physician(s): Nandigam,Kavitha V  History of Present Illness: Stacey Summers is a 73 y.o. female with a history of cirrhosis secondary to NASH.  She has had associated thrombocytopenia, splenomegaly and portal hypertension without evidence of esophageal or gastric varices by EGD.  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  This appears to be her dominant collateral pathway.  The portal vein is small in caliber and no ascites was present in the peritoneal cavity at that time.  No evidence of a recanalized umbilical vein or gastroesophageal varices by CT.  She started to develop some episodes of hepatic encephalopathy around September despite lactulose use which was somewhat sporadic due to diarrhea.  She and several family members tested positive for COVID-19 in late October.  She did not have any significant respiratory illness associated with infection.  Her daughter states that even just prior to her COVID diagnosis, Stacey Summers was sleeping throughout most of the day and would barely be awake during the day. At her worst, she would just transfer from bed to a couch and be in a recumbent, fetal position throughout the day.  She did start Xifaxan about 1 to 2 weeks ago and there has been substantial clinical improvement in her mental status that her daughter considers "miraculous". The patient is now eating, ambulates without assistance and has even gone shopping with her daughter.  She now only sleeps at night and is much more alert during the day and conversant. Last ammonia level on 06/26/19 was 41.  Past Medical History:  Diagnosis Date   Allergy    seasonal   Anxiety    Arthritis    hands, back     Cataract    surgery to remove   Chronic back pain    lower back    Cirrhosis of liver without ascites ([facility])    recent dx 07/11/18   COPD (chronic obstructive pulmonary disease) (HCC)    no inhaler   Diabetes mellitus without complication (HCC)    DJD (degenerative joint disease)    Fibromyalgia    Genetic testing 03/08/2017   Ms. Molstad underwent genetic counseling and testing for hereditary cancer syndromes on 02/21/2017. Her results were negative for mutations in all 83 genes analyzed by [facility]'s 83-gene Multi-Cancers Panel. 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   History of abnormal mammogram    Hyperlipidemia    Hypertension    Irregular heart beat    Neuromuscular disorder (HCC)    fibromyalgia   Sleep apnea    cpap some   SVD (spontaneous vaginal delivery)    x 2   Tubular adenoma of colon 05/09/12   "innumerable number" of flat polyps    Past Surgical History:  Procedure Laterality Date   CARPAL TUNNEL RELEASE Left 10/20/14   [facility]   CATARACT EXTRACTION, BILATERAL     CESAREAN SECTION  1977   CHOLECYSTECTOMY  1990   COLONOSCOPY     KNEE ARTHROSCOPY WITH SUBCHONDROPLASTY Right 03/27/2018   Procedure: Right knee arthroscopic  partial medial meniscus repair versus menisectomy with medial tibia subchondroplasty;  Surgeon: Nicholes Stairs, MD;  Location: [facility];  Service: Orthopedics;  Laterality: Right;  120 mins   LARYNX SURGERY     POLYP REMOVAL   MULTIPLE TOOTH EXTRACTIONS     POLYPECTOMY     vocal cord   TONSILLECTOMY  1965   TUBAL LIGATION     TUMOR REMOVAL     benign fatty tumors on abdomen   UPPER GASTROINTESTINAL ENDOSCOPY      Allergies: Latex  Medications: Prior to Admission  medications   Medication Sig Start Date End Date Taking? Authorizing Provider  ALPRAZolam (XANAX) 0.25 MG tablet Take 1 tablet (0.25 mg total) by mouth 2 (two) times daily as needed for anxiety. 04/02/19   Janora Norlander, DO  atorvastatin (LIPITOR) 40 MG tablet Take 1 tablet (40 mg total) by mouth daily. 04/02/19   Janora Norlander, DO  benazepril (LOTENSIN) 40 MG tablet Take 1 tablet (40 mg total) by mouth daily. 04/02/19   Janora Norlander, DO  blood glucose meter kit and supplies KIT Dispense based on patient and insurance preference. Use up to four times daily as directed. (FOR ICD-9 250.00, 250.01). 07/04/19   Claretta Fraise, MD  Blood Glucose Monitoring Suppl (LIBERTY BLOOD GLUCOSE MONITOR) DEVI 1 each by Does not apply route daily. Test 1x per day and prn-dx250.02 02/22/13   Chevis Pretty, FNP  Cholecalciferol (VITAMIN D3) 5000 units CAPS Take 5,000 Units by mouth daily.    [provider]  cyclobenzaprine (FLEXERIL) 5 MG tablet TAKE 1 TABLET BY MOUTH THREE TIMES DAILY AS NEEDED FOR MUSCLE SPASMS (NEEDS APPOINTMENT) 01/22/18   Hassell Done, Mary-Margaret, FNP  diltiazem (DILTIAZEM CD) 120 MG 24 hr capsule Take 1 capsule (120 mg total) by mouth daily. 12/28/18   Almyra Deforest, PA  furosemide (LASIX) 40 MG tablet TAKE 1 BY MOUTH EVERY MORNING AND 1/2 BY MOUTH AT 1 IN THE AFTERNOON 10/09/18   Ronnie Doss M, DO  glucose blood test strip Test 1x per day and prn- dx 250.02 04/25/14   Hassell Done, Mary-Margaret, FNP  lactulose (CHRONULAC) 10 GM/15ML solution Take 15 mLs (10 g total) by mouth 3 (three) times daily. 06/27/19   Mauri Pole, MD  meclizine (ANTIVERT) 25 MG tablet Take 1 tablet (25 mg total) by mouth 3 (three) times daily as needed for dizziness. 05/04/17   Hassell Done, Mary-Margaret, FNP  metFORMIN (GLUCOPHAGE) 500 MG tablet Take 1 tablet (500 mg total) by mouth 2 (two) times daily with a meal. Patient taking differently: Take 500 mg by mouth daily.  10/09/18   Janora Norlander, DO  omeprazole (PRILOSEC) 40 MG capsule Take 1 capsule (40 mg total) by mouth daily. 04/02/19   Janora Norlander, DO  ondansetron (ZOFRAN ODT) 4 MG disintegrating tablet Take 1 tablet (4 mg total) by mouth every 8 (eight) hours as needed. 02/26/19   Zehr, Janett Billow D, PA-C  ondansetron (ZOFRAN) 4 MG tablet Take 1 tablet (4 mg total) by mouth every 8 (eight) hours as needed for nausea or vomiting. 07/01/19   Mauri Pole, MD  ONE TOUCH ULTRA TEST test strip USE ONE STRIP TO CHECK GLUCOSE ONCE DAILY 03/06/17   Chevis Pretty, FNP  [facility] DELICA LANCETS 15B MISC USE ONE LANCET TO CHECK GLUCOSE ONCE DAILY AND AS NEEDED 10/10/17   Hassell Done, Mary-Margaret, FNP  rifaximin (XIFAXAN) 550 MG TABS tablet Take 1 tablet (550 mg total) by mouth 2 (two) times daily. 07/01/19  Mauri Pole, MD  traMADol (ULTRAM) 50 MG tablet Take 1 tablet (50 mg total) by mouth every 12 (twelve) hours as needed for moderate pain. 04/02/19   Gottschalk, Leatrice Jewels M, DO  UNABLE TO FIND Place 1 drop under the tongue. Med Name: Hemp Oil, One drop daily under tongue    [provider]     Family History  Problem Relation Age of Onset   Prostate cancer Father        d.89 diagnosed in late-60s   Colon cancer Father        diagnosed at 22   Heart failure Mother        d.37   Heart disease Mother    Colon polyps Brother        polyposis. Underwent partial colectomy at age 35.   Spina bifida Brother        died at birth   Stomach cancer Paternal Aunt        questionable   Kidney cancer Cousin        d.3s maternal first-cousin   Other Other 20       multiple lipomas   Colon cancer Paternal Uncle        in his 6s   Heart failure Maternal Grandmother        d.45   Heart failure Maternal Uncle        d.50   Colon polyps Son 38   Heart attack Son 33   Leukemia Maternal Aunt    Esophageal cancer Neg Hx    Rectal cancer Neg Hx     Social History   Socioeconomic History     Marital status: Married    Spouse name: [name]   Number of children: 3   Years of education: 12   Highest education level: High school graduate  Occupational History   Occupation: retired  Scientist, product/process development strain: Not hard at all   Food insecurity    Worry: Never true    Inability: Never true   Transportation needs    Medical: No    Non-medical: No  Tobacco Use   Smoking status: Former Smoker    Packs/day: 1.00    Years: 3.00    Pack years: 3.00    Types: Cigarettes    Quit date: 08/15/1969    Years since quitting: 49.9   Smokeless tobacco: Never Used  Substance and Sexual Activity   Alcohol use: No    Alcohol/week: 0.0 standard drinks   Drug use: No   Sexual activity: Not Currently  Lifestyle   Physical activity    Days per week: 0 days    Minutes per session: 0 min   Stress: Not at all  Relationships   Social connections    Talks on phone: More than three times a week    Gets together: More than three times a week    Attends religious service: More than 4 times per year    Active member of club or organization: Yes    Attends meetings of clubs or organizations: More than 4 times per year    Relationship status: Married  Other Topics Concern   Not on file  Social History Narrative   Not on file    Review of Systems  Constitutional: Negative.   Respiratory: Negative.   Cardiovascular: Negative.   Gastrointestinal: Negative for abdominal pain, blood in stool and vomiting.       Some abdominal distention.  Genitourinary: Negative.  Musculoskeletal: Negative.   Neurological: Positive for tremors.    Review of Systems: A 12 point ROS discussed and pertinent positives are indicated in the HPI above.  All other systems are negative.  Physical Exam No direct physical exam was performed (except for noted visual exam findings with Video Visits).   Vital Signs: There were no vitals taken for this visit.  Imaging: Ct Chest  Wo Contrast  Result Date: 06/28/2019 CLINICAL DATA:  Shortness of breath.  Recent COVID. EXAM: CT CHEST WITHOUT CONTRAST TECHNIQUE: Multidetector CT imaging of the chest was performed following the standard protocol without IV contrast. COMPARISON:  None. FINDINGS: Cardiovascular: The heart size is normal. No substantial pericardial effusion. Coronary artery calcification is evident. Atherosclerotic calcification is noted in the wall of the thoracic aorta. Mediastinum/Nodes: 12 mm short axis right paratracheal node evident. 12 mm short axis precarinal lymph node is associated. No evidence for gross hilar lymphadenopathy although assessment is limited by the lack of intravenous contrast on today's study. The esophagus has normal imaging features. There is no axillary lymphadenopathy. Lungs/Pleura: Diffuse peripheral subpleural ill-defined opacities are seen in the lungs bilaterally with relatively uniform craniocaudal distribution. Some of this opacity has a nodular configuration. No pulmonary edema no pleural effusion. Upper Abdomen: Nodular hepatic contour is consistent with cirrhosis. Musculoskeletal: No worrisome lytic or sclerotic osseous abnormality. IMPRESSION: 1. Symmetric peripheral irregular and nodular airspace disease, compatible with infectious/inflammatory etiology. Given the history of recent COVID infection, this likely reflects sequelae of viral pneumonia. Follow-up CT may be warranted to ensure resolution. 2. Mild mediastinal lymphadenopathy, presumably reactive 3. Nodular hepatic contour consistent with cirrhosis. 4.  Aortic Atherosclerois (ICD10-170.0) Electronically Signed   By: [name] M.D.   On: 06/28/2019 13:21    Labs:  CBC: Recent Labs    02/26/19 1509 05/21/19 1202 06/26/19 1640 06/28/19 1204  WBC 5.8 4.8 5.4 4.9  HGB 14.0 13.1 13.4 13.2  HCT 40.7 38.5 39.4 40.6  PLT 116.0* 88.0* 141.0* 135*    COAGS: Recent Labs    02/26/19 1509 05/21/19 1202 06/26/19 1640    INR 1.3* 1.2* 1.5*    BMP: Recent Labs    01/01/19 1425  05/07/19 1530 05/21/19 1202 06/26/19 1640 06/28/19 1204  NA 144   < > 145* 143 149* 144  K 4.4   < > 4.4 3.7 3.3* 3.1*  CL 106   < > 109* 105 105 101  CO2 26   < > 24 29 35* 31  GLUCOSE 132*   < > 144* 124* 127* 178*  BUN 15   < > 19 25* 17 21  CALCIUM 9.1   < > 9.1 9.3 8.8 9.0  CREATININE 0.76   < > 0.76 0.74 0.91 0.98  GFRNONAA 80  --  79  --   --  58*  GFRAA 92  --  91  --   --  >60   < > = values in this interval not displayed.    LIVER FUNCTION TESTS: Recent Labs    05/07/19 1530 05/21/19 1202 06/26/19 1640 06/28/19 1204  BILITOT 3.0* 2.6* 3.3* 3.8*  AST 52* 44* 46* 53*  ALT 31 27 23 28   ALKPHOS 75 85 70 73  PROT 5.5* 6.1 5.8* 5.8*  ALBUMIN 3.5* 3.6 3.1* 2.9*    TUMOR MARKERS: Recent Labs    05/21/19 1202  AFPTM 3.8    Assessment and Plan:  Stacey Summers appears to be doing much better clinically after starting Xifaxan.  Encephalopathy has improved significantly.  Dr. Silverio Decamp has ordered an abdominal ultrasound to check for ascites due to some abdominal distention.  Bilirubin has risen slightly up to 3.8 on 06/28/2019.  INR is 1.5. Current MELD-Na score is 16.  I spoke with Stacey Summers and her daughter Stacey Summers by phone.  We reviewed the possible utility of embolization of a splenorenal shunt usually with embolization coils in order to try to reduce shunting to improve hepatic encephalopathy.  If she does have ascites currently, embolization of a shunt could worsen ascites.  She does not have portal vein thrombosis and also does not have known esophageal or gastric varices which could potentially worsen with embolization of a well-developed splenorenal collateral pathway.  Fortunately, she has improved significantly by adding Xifaxan.  Her daughter feels that her mental status and performance status has completely changed in the last couple weeks.  For this reason, a procedure to try to  intentionally diminish or occlude the splenorenal shunt will not be necessary at this time.  Sometimes adequate catheterization of a shunt to allow embolization can be difficult due to tortuosity or web formation near the communication with the renal vein.  Alternative options to reduce flow would be direct portal access through the liver in order to gain access to the level of the splenic vein or partial splenic parenchymal embolization via the splenic artery in order to decrease splenic size and diminish flow secondarily into the splenorenal shunt.  At this time, a transcatheter procedure will not be necessary given clinical improvement.  I gave the patient's daughter our clinic number to call in the event that she progresses in the future to a more refractory state or decompensates to a state that does not respond to medication.  Thank you for this interesting consult.  I greatly enjoyed meeting Stacey Summers and look forward to participating in their care.  A copy of this report was sent to the requesting provider on this date.  Electronically Signed: Azzie Roup 07/16/2019, 4:06 PM     I spent a total of 30 Minutes in remote  clinical consultation, greater than 50% of which was counseling/coordinating care for possible embolization to treat hepatic encephalopathy.    Visit type: Audio only (telephone). Audio (no video) only due to patient's lack of internet/smartphone capability.   Alternative for in-person consultation at [facility], [address], [place], [place]. This visit type was conducted due to national recommendations for restrictions regarding the COVID-19 Pandemic (e.g. social distancing).  This format is felt to be most appropriate for this patient at this time.  All issues noted in this document were discussed and addressed.

## 2019-07-18 ENCOUNTER — Telehealth: Payer: Self-pay | Admitting: Gastroenterology

## 2019-07-18 NOTE — Telephone Encounter (Signed)
Pts daughter called and states pt has a cough. She wants to know what her mother can take for her cough that is ok for her to take due to her h/o cirrhosis. Please advise.

## 2019-07-19 NOTE — Telephone Encounter (Signed)
Spoke with pts daughter and she is aware.

## 2019-07-19 NOTE — Telephone Encounter (Signed)
Its ok for her to take OTC guaifenesin as needed, but if persistent contact PMD. Thanks

## 2019-08-01 ENCOUNTER — Other Ambulatory Visit (INDEPENDENT_AMBULATORY_CARE_PROVIDER_SITE_OTHER): Payer: PPO

## 2019-08-01 ENCOUNTER — Encounter: Payer: Self-pay | Admitting: Physician Assistant

## 2019-08-01 ENCOUNTER — Ambulatory Visit (INDEPENDENT_AMBULATORY_CARE_PROVIDER_SITE_OTHER): Payer: PPO | Admitting: Physician Assistant

## 2019-08-01 VITALS — BP 110/60 | HR 59 | Temp 97.5°F | Ht 62.0 in | Wt 215.0 lb

## 2019-08-01 DIAGNOSIS — K7581 Nonalcoholic steatohepatitis (NASH): Secondary | ICD-10-CM

## 2019-08-01 DIAGNOSIS — H449 Unspecified disorder of globe: Secondary | ICD-10-CM | POA: Diagnosis not present

## 2019-08-01 DIAGNOSIS — K7460 Unspecified cirrhosis of liver: Secondary | ICD-10-CM

## 2019-08-01 DIAGNOSIS — K7290 Hepatic failure, unspecified without coma: Secondary | ICD-10-CM | POA: Diagnosis not present

## 2019-08-01 DIAGNOSIS — E669 Obesity, unspecified: Secondary | ICD-10-CM

## 2019-08-01 DIAGNOSIS — Z8619 Personal history of other infectious and parasitic diseases: Secondary | ICD-10-CM | POA: Diagnosis not present

## 2019-08-01 DIAGNOSIS — E119 Type 2 diabetes mellitus without complications: Secondary | ICD-10-CM | POA: Diagnosis not present

## 2019-08-01 DIAGNOSIS — Z6839 Body mass index (BMI) 39.0-39.9, adult: Secondary | ICD-10-CM

## 2019-08-01 DIAGNOSIS — K7682 Hepatic encephalopathy: Secondary | ICD-10-CM

## 2019-08-01 DIAGNOSIS — I10 Essential (primary) hypertension: Secondary | ICD-10-CM | POA: Diagnosis not present

## 2019-08-01 DIAGNOSIS — Z86010 Personal history of colonic polyps: Secondary | ICD-10-CM | POA: Diagnosis not present

## 2019-08-01 DIAGNOSIS — Z1159 Encounter for screening for other viral diseases: Secondary | ICD-10-CM

## 2019-08-01 LAB — COMPREHENSIVE METABOLIC PANEL
ALT: 48 U/L — ABNORMAL HIGH (ref 0–35)
AST: 76 U/L — ABNORMAL HIGH (ref 0–37)
Albumin: 3.1 g/dL — ABNORMAL LOW (ref 3.5–5.2)
Alkaline Phosphatase: 93 U/L (ref 39–117)
BUN: 19 mg/dL (ref 6–23)
CO2: 31 mEq/L (ref 19–32)
Calcium: 8.8 mg/dL (ref 8.4–10.5)
Chloride: 106 mEq/L (ref 96–112)
Creatinine, Ser: 0.84 mg/dL (ref 0.40–1.20)
GFR: 66.77 mL/min (ref >60.00)
Glucose, Bld: 125 mg/dL — ABNORMAL HIGH (ref 70–99)
Potassium: 3.6 mEq/L (ref 3.5–5.1)
Sodium: 143 mEq/L (ref 135–145)
Total Bilirubin: 2.4 mg/dL — ABNORMAL HIGH (ref 0.2–1.2)
Total Protein: 5.6 g/dL — ABNORMAL LOW (ref 6.0–8.3)

## 2019-08-01 LAB — CBC WITH DIFFERENTIAL/PLATELET
Basophils Absolute: 0.1 10*3/uL (ref 0.0–0.1)
Basophils Relative: 1.2 % (ref 0.0–3.0)
Eosinophils Absolute: 0.2 10*3/uL (ref 0.0–0.7)
Eosinophils Relative: 4 % (ref 0.0–5.0)
HCT: 38.3 % (ref 36.0–46.0)
Hemoglobin: 13 g/dL (ref 12.0–15.0)
Lymphocytes Relative: 33.3 % (ref 12.0–46.0)
Lymphs Abs: 1.7 10*3/uL (ref 0.7–4.0)
MCHC: 33.9 g/dL (ref 30.0–36.0)
MCV: 95.5 fl (ref 78.0–100.0)
Monocytes Absolute: 0.5 10*3/uL (ref 0.1–1.0)
Monocytes Relative: 9.2 % (ref 3.0–12.0)
Neutro Abs: 2.7 10*3/uL (ref 1.4–7.7)
Neutrophils Relative %: 52.3 % (ref 43.0–77.0)
Platelets: 97 10*3/uL — ABNORMAL LOW (ref 150.0–400.0)
RBC: 4.01 Mil/uL (ref 3.87–5.11)
RDW: 15.4 % (ref 11.5–15.5)
WBC: 5.2 10*3/uL (ref 4.0–10.5)

## 2019-08-01 LAB — PROTIME-INR
INR: 1.3 ratio — ABNORMAL HIGH (ref 0.8–1.0)
Prothrombin Time: 15.5 s — ABNORMAL HIGH (ref 9.6–13.1)

## 2019-08-01 LAB — AMMONIA: Ammonia: 59 umol/L — ABNORMAL HIGH (ref 11–35)

## 2019-08-01 NOTE — Patient Instructions (Addendum)
If you are age 71 or older, your body mass index should be between 23-30. Your Body mass index is 39.32 kg/m. If this is out of the aforementioned range listed, please consider follow up with your Primary Care Provider.  If you are age 55 or younger, your body mass index should be between 19-25. Your Body mass index is 39.32 kg/m. If this is out of the aformentioned range listed, please consider follow up with your Primary Care Provider.   You have been scheduled for an endoscopy. Please follow written instructions given to you at your visit today. If you use inhalers (even only as needed), please bring them with you on the day of your procedure.  Please follow a 2 gram sodium diet and 60 grams of protien. Continue with your seizure meds.  Thank you for choosing me and [facility]

## 2019-08-01 NOTE — Progress Notes (Signed)
Subjective:    Patient ID: Stacey Summers, female    DOB: [birth year], 72 y.o.   MRN: [id number]  HPI Stacey Summers is a pleasant 73 year old white female, known to Dr. Silverio Decamp who is followed for Stacey Summers cirrhosis.  Patient was last seen on 06/26/2019, after recovering from [facility] October 2020.  She was having problems with decline in mental status and intermittent confusion felt secondary to hepatic encephalopathy.  She was supposed to be on lactulose but had not been taking regularly due to causing diarrhea. She was started on Xifaxan 550 mg twice daily and they were also instructed to increase lactulose to 15 cc 3 times daily. She had labs done on 06/26/2019-T bili 3.3/alk phos 70/AST 46/ALT 23/creatinine 0.9/sodium 149/platelets 141.  Ammonia 41/INR 1.5 M ELD=16  Patient comes in with her daughter today who states that the Doreene Nest has made a world of difference and that her mom has been mentating very well and just feeling better in general.  Patient tried to participate in the conversation today but is very hard of hearing and having difficulty with mask etc.  She has been taking meds as directed, generally having a couple of bowel movements per day but no diarrhea.  No complaints of abdominal pain, appetite has been better. Both patient and daughter are asking questions about her status, and asking what stage her liver disease is at.  Patient's daughter says that she understood from conversation with Dr. Silverio Decamp that she was grade D and she is very worried about her prognosis.  Patient asks if she can increase her protein intake a bit as she has been missing dairy products and cheese. Last EGD was done December 2019, no varices noted.  Last: December 2019 with multiple diverticuli and 4 polyps were removed which were adenomatous.  Review of Systems Pertinent positive and negative review of systems were noted in the above HPI section.  All other review of systems was otherwise negative.  Outpatient  Encounter Medications as of 08/01/2019  Medication Sig  . ALPRAZolam (XANAX) 0.25 MG tablet Take 1 tablet (0.25 mg total) by mouth 2 (two) times daily as needed for anxiety.  Marland Kitchen atorvastatin (LIPITOR) 40 MG tablet Take 1 tablet (40 mg total) by mouth daily.  . benazepril (LOTENSIN) 40 MG tablet Take 1 tablet (40 mg total) by mouth daily.  . blood glucose meter kit and supplies KIT Dispense based on patient and insurance preference. Use up to four times daily as directed. (FOR ICD-9 250.00, 250.01).  . Blood Glucose Monitoring Suppl (LIBERTY BLOOD GLUCOSE MONITOR) DEVI 1 each by Does not apply route daily. Test 1x per day and prn-dx250.02  . Cholecalciferol (VITAMIN D3) 5000 units CAPS Take 5,000 Units by mouth daily.  . cyclobenzaprine (FLEXERIL) 5 MG tablet TAKE 1 TABLET BY MOUTH THREE TIMES DAILY AS NEEDED FOR MUSCLE SPASMS (NEEDS APPOINTMENT)  . diltiazem (DILTIAZEM CD) 120 MG 24 hr capsule Take 1 capsule (120 mg total) by mouth daily.  . furosemide (LASIX) 40 MG tablet TAKE 1 BY MOUTH EVERY MORNING AND 1/2 BY MOUTH AT 1 IN THE AFTERNOON  . glucose blood test strip Test 1x per day and prn- dx 250.02  . lactulose (CHRONULAC) 10 GM/15ML solution Take 15 mLs (10 g total) by mouth 3 (three) times daily.  . meclizine (ANTIVERT) 25 MG tablet Take 1 tablet (25 mg total) by mouth 3 (three) times daily as needed for dizziness.  . metFORMIN (GLUCOPHAGE) 500 MG tablet Take 1 tablet (500 mg total)  by mouth 2 (two) times daily with a meal. (Patient taking differently: Take 500 mg by mouth daily. )  . omeprazole (PRILOSEC) 40 MG capsule Take 1 capsule (40 mg total) by mouth daily.  . ondansetron (ZOFRAN) 4 MG tablet Take 1 tablet (4 mg total) by mouth every 8 (eight) hours as needed for nausea or vomiting.  . ONE TOUCH ULTRA TEST test strip USE ONE STRIP TO CHECK GLUCOSE ONCE DAILY  . ONETOUCH DELICA LANCETS 09O MISC USE ONE LANCET TO CHECK GLUCOSE ONCE DAILY AND AS NEEDED  . rifaximin (XIFAXAN) 550 MG TABS  tablet Take 1 tablet (550 mg total) by mouth 2 (two) times daily.  . traMADol (ULTRAM) 50 MG tablet Take 1 tablet (50 mg total) by mouth every 12 (twelve) hours as needed for moderate pain.  Marland Kitchen UNABLE TO FIND Place 1 drop under the tongue. Med Name: Hemp Oil, One drop daily under tongue  . [DISCONTINUED] ondansetron (ZOFRAN ODT) 4 MG disintegrating tablet Take 1 tablet (4 mg total) by mouth every 8 (eight) hours as needed.   No facility-administered encounter medications on file as of 08/01/2019.   Allergies  Allergen Reactions  . Latex     "my hands feels numb"   Patient Active Problem List   Diagnosis Date Noted  . Encephalopathy, hepatic ([place]) 08/01/2019  . Thrombocytopenia ([place]) 07/05/2019  . Hyperlipidemia associated with type 2 diabetes mellitus ([place]) 04/02/2019  . Liver cirrhosis secondary to NASH ([place]) 07/11/2018  . Complex tear of medial meniscus of right knee 03/27/2018  . Stress fracture of right tibia 03/27/2018  . Chronic pain of right knee 12/19/2017  . Genetic testing 03/08/2017  . Restrictive lung disease secondary to obesity 08/04/2015  . Physical deconditioning 08/04/2015  . OSA treated with BiPAP 08/04/2015  . Gastroesophageal reflux disease without esophagitis 12/25/2014  . Low serum vitamin D 05/28/2014  . Osteopenia 05/28/2014  . COPD (chronic obstructive pulmonary disease) ([place]) 01/29/2014  . Dyspnea on exertion 01/07/2014  . Fatigue 02/26/13  . Murmur, cardiac February 26, 2013  . Diabetes ([place]) February 26, 2013  . Morbid obesity ([place]) 26-Feb-2013  . Family history of sudden death 2013/02/26  . Hyperlipidemia 02-26-2013  . Hypertension associated with diabetes ([place]) 12/20/2012  . GAD (generalized anxiety disorder) 12/20/2012  . Fibromyalgia muscle pain 12/20/2012   Social History   Socioeconomic History  . Marital status: Married    Spouse name: [name]  . Number of children: 3  . Years of education: 74  . Highest education level: High school graduate    Occupational History  . Occupation: retired  Tobacco Use  . Smoking status: Former Smoker    Packs/day: 1.00    Years: 3.00    Pack years: 3.00    Types: Cigarettes    Quit date: 08/15/1969    Years since quitting: 49.9  . Smokeless tobacco: Never Used  Substance and Sexual Activity  . Alcohol use: No    Alcohol/week: 0.0 standard drinks  . Drug use: No  . Sexual activity: Not Currently  Other Topics Concern  . Not on file  Social History Narrative  . Not on file   Social Determinants of Health   Financial Resource Strain: Low Risk   . Difficulty of Paying Living Expenses: Not hard at all  Food Insecurity: No Food Insecurity  . Worried About Charity fundraiser in the Last Year: Never true  . Ran Out of Food in the Last Year: Never true  Transportation Needs: No Transportation Needs  .  Lack of Transportation (Medical): No  . Lack of Transportation (Non-Medical): No  Physical Activity: Inactive  . Days of Exercise per Week: 0 days  . Minutes of Exercise per Session: 0 min  Stress: No Stress Concern Present  . Feeling of Stress : Not at all  Social Connections: Not Isolated  . Frequency of Communication with Friends and Family: More than three times a week  . Frequency of Social Gatherings with Friends and Family: More than three times a week  . Attends Religious Services: More than 4 times per year  . Active Member of Clubs or Organizations: Yes  . Attends Archivist Meetings: More than 4 times per year  . Marital Status: Married  Human resources officer Violence: Not At Risk  . Fear of Current or Ex-Partner: No  . Emotionally Abused: No  . Physically Abused: No  . Sexually Abused: No    Ms. Schoon's family history includes Colon cancer in her father and paternal uncle; Colon polyps in her brother; Colon polyps (age of onset: 39) in her son; Heart attack (age of onset: 4) in her son; Heart disease in her mother; Heart failure in her maternal grandmother,  maternal uncle, and mother; Kidney cancer in her cousin; Leukemia in her maternal aunt; Other (age of onset: 13) in an other family member; Prostate cancer in her father; Spina bifida in her brother; Stomach cancer in her paternal aunt.      Objective:    Vitals:   08/01/19 1337  BP: 110/60  Pulse: (!) 59  Temp: (!) 97.5 F (36.4 C)    Physical Exam Well-developed well-nourished older white female in no acute distress.  She is accompanied by her daughter.  Patient is hard of hearing.   Weight, 215 BMI 39.3  HEENT; nontraumatic normocephalic, EOMI, PE RR LA, sclera anicteric. Oropharynx; not examined/mask/Covid Neck; supple, no JVD Cardiovascular; regular rate and rhythm with S1-S2, no murmur rub or gallop Pulmonary; Clear bilaterally Abdomen; soft, nontender, nondistended no palpable fluid wave, no palpable mass or hepatosplenomegaly, bowel sounds are active Rectal; not done today Skin; benign exam, no jaundice rash or appreciable lesions Extremities; trace edema bilateral ankles, extremities cool to touch Neuro/Psych; alert and oriented x4, grossly nonfocal mood and affect appropriate no asterixis, hard of hearing       Assessment & Plan:   #64 73 year old white female with NASH cirrhosis, complicated by hepatic encephalopathy, who is much improved with addition of Xifaxan twice daily, and lactulose 15 cc 3 times daily. She is mentating well today, no asterixis  Most recent M ELD=16  She is up-[facility]-date with [facility] surveillance, ultrasound was done in October 2020 and had normal AFP. She is due for follow-up EGD for variceal surveillance. She continues on Lasix 40 mg every morning and 20 mg every afternoon, no Aldactone.  There is no evidence of ascites or peripheral edema today  #2 history of adenomatous colon polyps 3.  COPD 4.  Adult onset diabetes mellitus 5.  Obesity 6.  Hypertension  Plan; labs today to include CBC, INR, ammonia and c-Met. No changes to current  medication regimen at present. Patient will be scheduled for EGD with Dr. [facility].  Procedure was discussed in detail with the patient and her daughter including indications risks and benefits and they are agreeable to proceed. We discussed continuing 2 g sodium diet and moderate protein intake, advised her she could have 1 glass of milk per day and 1 slice of cheese. I discussion with  patient's daughter regarding scoring of liver disease, and relayed that a M ELD score of 16, child's class B with 75-monthmortality of 6% is  low.  Overall her liver disease has been stable and we will continue to monitor closely  Jyoti Harju SGenia HaroldPA-C 08/01/2019   Cc: GJanora Norlander DO

## 2019-08-02 ENCOUNTER — Telehealth: Payer: Self-pay | Admitting: *Deleted

## 2019-08-02 NOTE — Telephone Encounter (Signed)
Faxed new application and new information for 2021 [facility] Patient Assistance Program today, Patient was approved in November but only until 08/15/2019.

## 2019-08-07 ENCOUNTER — Encounter: Payer: Self-pay | Admitting: Gastroenterology

## 2019-08-14 ENCOUNTER — Ambulatory Visit (INDEPENDENT_AMBULATORY_CARE_PROVIDER_SITE_OTHER): Payer: PPO

## 2019-08-14 ENCOUNTER — Other Ambulatory Visit: Payer: Self-pay | Admitting: Gastroenterology

## 2019-08-14 DIAGNOSIS — Z1159 Encounter for screening for other viral diseases: Secondary | ICD-10-CM

## 2019-08-15 LAB — SARS CORONAVIRUS 2 (TAT 6-24 HRS): SARS Coronavirus 2: NEGATIVE

## 2019-08-20 ENCOUNTER — Ambulatory Visit (AMBULATORY_SURGERY_CENTER): Payer: PPO | Admitting: Gastroenterology

## 2019-08-20 ENCOUNTER — Encounter: Payer: Self-pay | Admitting: Gastroenterology

## 2019-08-20 ENCOUNTER — Other Ambulatory Visit: Payer: Self-pay

## 2019-08-20 VITALS — BP 119/51 | HR 83 | Temp 97.6°F | Resp 18 | Ht 62.0 in | Wt 215.0 lb

## 2019-08-20 DIAGNOSIS — I8510 Secondary esophageal varices without bleeding: Secondary | ICD-10-CM | POA: Diagnosis not present

## 2019-08-20 DIAGNOSIS — K7581 Nonalcoholic steatohepatitis (NASH): Secondary | ICD-10-CM | POA: Diagnosis not present

## 2019-08-20 DIAGNOSIS — K7460 Unspecified cirrhosis of liver: Secondary | ICD-10-CM

## 2019-08-20 DIAGNOSIS — K766 Portal hypertension: Secondary | ICD-10-CM

## 2019-08-20 DIAGNOSIS — K3189 Other diseases of stomach and duodenum: Secondary | ICD-10-CM | POA: Diagnosis not present

## 2019-08-20 MED ORDER — SODIUM CHLORIDE 0.9 % IV SOLN: 500.0000 mL | Freq: Once | INTRAVENOUS | Status: DC

## 2019-08-20 NOTE — Op Note (Signed)
[place] Patient Name: Stacey Summers Procedure Date: 08/20/2019 1:46 PM MRN: [id number] Endoscopist: Mauri Pole , MD Age: 74 Referring MD:  Date of Birth: March 01, 1946 Gender: Female Account #: [id number] Procedure:                Upper GI endoscopy Indications:              Follow-up of esophageal varices in patient with                            cirrhosis and portal hypertension Medicines:                Monitored Anesthesia Care Procedure:                Pre-Anesthesia Assessment:                           - Prior to the procedure, a History and Physical                            was performed, and patient medications and                            allergies were reviewed. The patient's tolerance of                            previous anesthesia was also reviewed. The risks                            and benefits of the procedure and the sedation                            options and risks were discussed with the patient.                            All questions were answered, and informed consent                            was obtained. Prior Anticoagulants: The patient has                            taken no previous anticoagulant or antiplatelet                            agents. ASA Grade Assessment: III - A patient with                            severe systemic disease. After reviewing the risks                            and benefits, the patient was deemed in                            satisfactory condition to undergo the procedure.  After obtaining informed consent, the endoscope was                            passed under direct vision. Throughout the                            procedure, the patient's blood pressure, pulse, and                            oxygen saturations were monitored continuously. The                            Endoscope was introduced through the mouth, and                            advanced to the  second part of duodenum. The upper                            GI endoscopy was accomplished without difficulty.                            The patient tolerated the procedure well. Scope In: Scope Out: Findings:                 Grade I, small (< 5 mm) varices were found in the                            lower third of the esophagus. They were less than 1                            mm in largest diameter.                           No gross lesions were noted in the entire esophagus.                           Mild portal hypertensive gastropathy was found in                            the entire examined stomach.                           The examined duodenum was normal. Complications:            No immediate complications. Estimated Blood Loss:     Estimated blood loss: none. Impression:               - Grade I and small (< 5 mm) esophageal varices.                           - No gross lesions in esophagus.                           - Portal hypertensive gastropathy.                           -  Normal examined duodenum.                           - No specimens collected. Recommendation:           - Patient has a contact number available for                            emergencies. The signs and symptoms of potential                            delayed complications were discussed with the                            patient. Return to normal activities tomorrow.                            Written discharge instructions were provided to the                            patient.                           - Resume previous diet.                           - Continue present medications.                           - Return to GI office in 3 months. Mauri Pole, MD 08/20/2019 2:09:18 PM This report has been signed electronically.

## 2019-08-20 NOTE — Progress Notes (Signed)
Pt tolerated well. VSS. Awake and to recovery. 

## 2019-08-20 NOTE — Progress Notes (Signed)
Pt's states no medical or surgical changes since previsit or office visit.  Temp- [place]

## 2019-08-20 NOTE — Patient Instructions (Signed)
Please, call the office in about 1 month to schedule an office visit for April.  Read all of the handouts given to you by your recovery room nurse.  YOU HAD AN ENDOSCOPIC PROCEDURE TODAY AT [facility]:   Refer to the procedure report that was given to you for any specific questions about what was found during the examination.  If the procedure report does not answer your questions, please call your gastroenterologist to clarify.  If you requested that your care partner not be given the details of your procedure findings, then the procedure report has been included in a sealed envelope for you to review at your convenience later.  YOU SHOULD EXPECT: Some feelings of bloating in the abdomen. Passage of more gas than usual.  Walking can help get rid of the air that was put into your GI tract during the procedure and reduce the bloating.   Please Note:  You might notice some irritation and congestion in your nose or some drainage.  This is from the oxygen used during your procedure.  There is no need for concern and it should clear up in a day or so.  SYMPTOMS TO REPORT IMMEDIATELY:    Following upper endoscopy (EGD)  Vomiting of blood or coffee ground material  New chest pain or pain under the shoulder blades  Painful or persistently difficult swallowing  New shortness of breath  Fever of 100F or higher  Black, tarry-looking stools  For urgent or emergent issues, a gastroenterologist can be reached at any hour by calling [phone].   DIET:  We do recommend a small meal at first, but then you may proceed to your regular diet.  Drink plenty of fluids but you should avoid alcoholic beverages for 24 hours.  ACTIVITY:  You should plan to take it easy for the rest of today and you should NOT DRIVE or use heavy machinery until tomorrow (because of the sedation medicines used during the test).    FOLLOW UP: Our staff will call the number listed on your records 48-72 hours  following your procedure to check on you and address any questions or concerns that you may have regarding the information given to you following your procedure. If we do not reach you, we will leave a message.  We will attempt to reach you two times.  During this call, we will ask if you have developed any symptoms of COVID 19. If you develop any symptoms (ie: fever, flu-like symptoms, shortness of breath, cough etc.) before then, please call [phone].  If you test positive for Covid 19 in the 2 weeks post procedure, please call and report this information to [place].    If any biopsies were taken you will be contacted by phone or by letter within the next 1-3 weeks.  Please call us at [phone] if you have not heard about the biopsies in 3 weeks.    SIGNATURES/CONFIDENTIALITY: You and/or your care partner have signed paperwork which will be entered into your electronic medical record.  These signatures attest to the fact that that the information above on your After Visit Summary has been reviewed and is understood.  Full responsibility of the confidentiality of this discharge information lies with you and/or your care-partner.

## 2019-08-20 NOTE — Progress Notes (Signed)
Reviewed and agree with documentation and assessment and plan. K. Veena Genni Buske , MD   

## 2019-08-22 ENCOUNTER — Telehealth: Payer: Self-pay | Admitting: *Deleted

## 2019-08-22 NOTE — Telephone Encounter (Signed)
  Follow up Call-  Call back number 08/20/2019 08/07/2018 12/06/2016  Post procedure Call Back phone  # [phone] cell  Permission to leave phone message Yes Yes Yes  Some recent data might be hidden     Patient questions:  Do you have a fever, pain , or abdominal swelling? No. Pain Score  0 *  Have you tolerated food without any problems? Yes.    Have you been able to return to your normal activities? Yes.    Do you have any questions about your discharge instructions: Diet   No. Medications  No. Follow up visit  No.  Do you have questions or concerns about your Care? No.  Actions: * If pain score is 4 or above: No action needed, pain <4.  1. Have you developed a fever since your procedure? No   2.   Have you had an respiratory symptoms (SOB or cough) since your procedure? no  3.   Have you tested positive for COVID 19 since your procedure no  4.   Have you had any family members/close contacts diagnosed with the COVID 19 since your procedure?  no   If yes to any of these questions please route to Joylene John, RN and Alphonsa Gin, Therapist, sports.

## 2019-09-09 NOTE — Telephone Encounter (Signed)
Leavy Cella approved with [facility] PAP until 08/14/2020  Will send approval to be scanned in

## 2019-09-13 ENCOUNTER — Telehealth: Payer: Self-pay | Admitting: Cardiovascular Disease

## 2019-09-13 ENCOUNTER — Telehealth: Payer: Self-pay

## 2019-09-13 NOTE — Telephone Encounter (Signed)
Spoke to patient she stated home health RN saw her this morning.Stated she was concerned she was having a lot of palpitations.Stated she was unaware she was having them.Stated she has always had palpitations and she notices them at times.Stated she wanted her to be seen.She only wants to see Dr.Croitoru.Advised his schedule is full.I will send message to his RN for appointment.

## 2019-09-13 NOTE — Telephone Encounter (Signed)
Patient c/o Palpitations:  High priority if patient c/o lightheadedness, shortness of breath, or chest pain  1) How long have you had palpitations/irregular HR/ Afib? Are you having the symptoms now? Patient hasn't been feeling anything, but the nurse Sadie noticed it today.  2) Are you currently experiencing lightheadedness, SOB or CP? no  3) Do you have a history of afib (atrial fibrillation) or irregular heart rhythm? no  4) Have you checked your BP or HR? (document readings if available): 122/68  5) Are you experiencing any other symptoms? Some headaches  Sadie [facility] calling stating she saw the patient today and noticed she had some palpitations. She states the patient is not having symptoms other than some headaches, but was unsure if the patient still needs to be seen. She also says she will be faxing over notes from their visit.

## 2019-09-13 NOTE — Telephone Encounter (Signed)
Received a call from patient she stated she will see a PA until she can get appointment with Dr.Croitoru.Appointment scheduled with Coletta Memos PA 09/17/19 at 11:45 am.

## 2019-09-13 NOTE — Progress Notes (Signed)
Cardiology Clinic Note   Patient Name: Stacey Summers Date of Encounter: 09/17/2019  Primary Care Provider:  Janora Norlander, DO Primary Cardiologist:  Sanda Klein, MD  Patient Profile    Stacey Summers 74 year old female presents today for follow-up of her hypertension, OSA, hyperlipidemia, murmur and dyspnea on exertion.  Past Medical History    Past Medical History:  Diagnosis Date  . Allergy    seasonal  . Anxiety   . Arthritis    hands, back   . Cataract    surgery to remove  . Chronic back pain    lower back   . Cirrhosis of liver without ascites ([place])    recent dx 07/11/18  . COPD (chronic obstructive pulmonary disease) (HCC)    no inhaler  . Diabetes mellitus without complication ([place])   . DJD (degenerative joint disease)   . Fibromyalgia   . Genetic testing 03/08/2017   Stacey Summers underwent genetic counseling and testing for hereditary cancer syndromes on 02/21/2017. Her results were negative for mutations in all 83 genes analyzed by [facility]'s 83-gene Multi-Cancers Panel. Genes analyzed include: ALK, APC, ATM, AXIN2, BAP1, BARD1, BLM, BMPR1A, BRCA1, BRCA2, BRIP1, CASR, CDC73, CDH1, CDK4, CDKN1B, CDKN1C, CDKN2A, CEBPA, CHEK2, CTNNA1, DICER1, DIS3L2, EGFR, EPCAM  . GERD (gastroesophageal reflux disease)   . 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 History of abnormal mammogram   . Hyperlipidemia   . Hypertension   . Irregular heart beat   . Neuromuscular disorder (HCC)    fibromyalgia  . Sleep apnea    cpap some  . SVD (spontaneous vaginal delivery)    x 2  . Tubular adenoma of colon 05/09/12   "innumerable number" of flat polyps   Past Surgical History:  Procedure Laterality Date  . CARPAL TUNNEL RELEASE Left 10/20/14   [facility]  . CATARACT EXTRACTION, BILATERAL    . [place]  . CHOLECYSTECTOMY  1990  . COLONOSCOPY    . IR RADIOLOGIST EVAL & MGMT  07/16/2019  . KNEE ARTHROSCOPY WITH SUBCHONDROPLASTY Right 03/27/2018     Procedure: Right knee arthroscopic partial medial meniscus repair versus menisectomy with medial tibia subchondroplasty;  Surgeon: Nicholes Stairs, MD;  Location: [facility];  Service: Orthopedics;  Laterality: Right;  120 mins  . LARYNX SURGERY     POLYP REMOVAL  . MULTIPLE TOOTH EXTRACTIONS    . POLYPECTOMY     vocal cord  . TONSILLECTOMY  1965  . TUBAL LIGATION    . TUMOR REMOVAL     benign fatty tumors on abdomen  . UPPER GASTROINTESTINAL ENDOSCOPY      Allergies  Allergies  Allergen Reactions  . Latex     "my hands feels numb"    History of Present Illness    Stacey Summers has a past medical history of morbid obesity, OSA, essential hypertension, lipidemia, and chronic leg edema.  She was seen by Dr. Sallyanne Kuster in May 2020 with complaints of palpitations.  Her amlodipine was stopped with the intention of starting diltiazem.  However, she never started the diltiazem and on follow-up her palpitations have subsided.  Her blood pressure remained stable and controlled.  No further medications were started at that time.    She has obstructive sleep apnea but is not always compliant with her CPAP.  On last evaluation she wore it more than 50% of the time.  Her concern with the device is that it feels like a  smothering/constricting at times.  She is known to have chronic back pain which she receives injections for.  Dr. Tonita Cong is also started discussing the need for back surgery.  During her last visit 01/25/2019 she mentions being concerned about blood clots.  She indicated that her mother died suddenly without clear explanation by postmortem at age 19.  Her brother had several comorbidities including diabetes.  He died in his sleep at age 29.  She indicated that she thought this may be due to blood clots.  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  She presents the clinic today for follow-up and states her home health nurse indicated that she was having frequent palpitations 09/13/19.  She did not feel these.  She indicates that she has had palpitations for quite a long time.  This is something that she has been aware of at times but usually unaware.  She had not taken her diltiazem 120 mg after being taken off of her amlodipine until 09/13/2019 when she started having increased palpitations.  She is tolerating the medication well and states that she is not feeling palpitations at this time.  Her physical activity is limited due to her chronic back pain.  She states she has lost some weight due to changes in her diet and having to avoid certain foods due to her cirrhosis.  She denies chest pain, shortness of breath, lower extremity edema, fatigue, increased palpitations, melena, hematuria, hemoptysis, diaphoresis, weakness, presyncope, syncope, orthopnea, and PND.   Home Medications    Prior to Admission medications   Medication Sig Start Date End Date Taking? Authorizing Provider  ALPRAZolam (XANAX) 0.25 MG tablet Take 1 tablet (0.25 mg total) by mouth 2 (two) times daily as needed for anxiety. 04/02/19   Janora Norlander, DO  atorvastatin (LIPITOR) 40 MG tablet Take 1 tablet (40 mg total) by mouth daily. 04/02/19   Janora Norlander, DO  benazepril (LOTENSIN) 40 MG tablet Take 1 tablet (40 mg total) by mouth daily. 04/02/19   Janora Norlander, DO  blood glucose meter kit and supplies KIT Dispense based on patient and insurance preference. Use up to four times daily as directed. (FOR ICD-9 250.00, 250.01). 07/04/19   Claretta Fraise, MD  Blood Glucose Monitoring Suppl (LIBERTY BLOOD GLUCOSE MONITOR) DEVI 1 each by Does not apply route daily. Test 1x per day and prn-dx250.02 02/22/13   Chevis Pretty, FNP  Cholecalciferol (VITAMIN D3) 5000 units CAPS Take 5,000 Units by mouth daily.    [provider]  cyclobenzaprine (FLEXERIL) 5 MG tablet TAKE 1 TABLET BY MOUTH THREE TIMES DAILY AS NEEDED FOR MUSCLE SPASMS (NEEDS APPOINTMENT) 01/22/18   Hassell Done, Mary-Margaret, FNP  diltiazem (DILTIAZEM CD) 120 MG 24 hr capsule Take 1 capsule (120 mg total) by mouth daily. 12/28/18   Almyra Deforest, PA  furosemide (LASIX) 40 MG tablet TAKE 1 BY MOUTH EVERY MORNING AND 1/2 BY MOUTH AT 1 IN THE AFTERNOON 10/09/18   Ronnie Doss M, DO  glucose blood test strip Test 1x per day and prn- dx 250.02 04/25/14   Hassell Done, Mary-Margaret, FNP  lactulose (CHRONULAC) 10 GM/15ML solution Take 15 mLs (10 g total) by mouth 3 (three) times daily. 06/27/19   Mauri Pole, MD  meclizine (ANTIVERT) 25 MG tablet Take 1 tablet (25 mg total) by mouth 3 (three) times daily as needed  for dizziness. Patient not taking: Reported on 08/20/2019 05/04/17   Chevis Pretty, FNP  metFORMIN (GLUCOPHAGE) 500 MG tablet Take 1 tablet (500 mg total) by mouth 2 (two) times daily with a meal. Patient taking differently: Take 500 mg by mouth daily.  10/09/18   Janora Norlander, DO  omeprazole (PRILOSEC) 40 MG capsule Take 1 capsule (40 mg total) by mouth daily. 04/02/19   Janora Norlander, DO  ondansetron (ZOFRAN) 4 MG tablet Take 1 tablet (4 mg total) by mouth every 8 (eight) hours as needed for nausea or vomiting. Patient not taking: Reported on 08/20/2019 07/01/19   Mauri Pole, MD  ONE TOUCH ULTRA TEST test strip USE ONE STRIP TO CHECK GLUCOSE ONCE DAILY 03/06/17   Chevis Pretty, FNP  [facility] DELICA LANCETS 91M MISC USE ONE LANCET TO CHECK GLUCOSE ONCE DAILY AND AS NEEDED 10/10/17   Hassell Done, Mary-Margaret, FNP  rifaximin (XIFAXAN) 550 MG TABS tablet Take 1 tablet (550 mg total) by mouth 2 (two) times daily. 07/01/19   Mauri Pole, MD  traMADol (ULTRAM) 50 MG tablet Take 1 tablet (50 mg total) by mouth every 12 (twelve) hours as needed for moderate pain. Patient not taking: Reported on 08/20/2019 04/02/19    Janora Norlander, DO  UNABLE TO FIND Place 1 drop under the tongue. Med Name: Hemp Oil, One drop daily under tongue    [provider]    Family History    Family History  Problem Relation Age of Onset  . Prostate cancer Father        d.89 diagnosed in late-60s  . Colon cancer Father        diagnosed at 62  . Heart failure Mother        d.37  . Heart disease Mother   . Colon polyps Brother        polyposis. Underwent partial colectomy at age 43.  Marland Kitchen Spina bifida Brother        died at birth  . Stomach cancer Paternal Aunt        questionable  . Kidney cancer Cousin        d.78s maternal first-cousin  . Other Other 16       multiple lipomas  . Colon cancer Paternal Uncle        in his 49s  . Heart failure Maternal Grandmother        d.45  . Heart failure Maternal Uncle        d.50  . Colon polyps Son 57  . Heart attack Son 45  . Leukemia Maternal Aunt   . Esophageal cancer Neg Hx   . Rectal cancer Neg Hx    She indicated that her mother is deceased. She indicated that her father is deceased. She indicated that both of her brothers are deceased. She indicated that her maternal grandmother is deceased. She indicated that her maternal grandfather is deceased. She indicated that her paternal grandmother is deceased. She indicated that her paternal grandfather is deceased. She indicated that her daughter is alive. She indicated that both of her sons are alive. She indicated that one of her two maternal aunts is deceased. She indicated that her maternal uncle is deceased. She indicated that only one of her four paternal aunts is alive. She indicated that her paternal uncle is alive. She indicated that her cousin is deceased. She indicated that the status of her neg hx is unknown. She indicated that her other is alive.  Social  History    Social History   Socioeconomic History  . Marital status: Married    Spouse name: [name]  . Number of children: 3  . Years of  education: 72  . Highest education level: High school graduate  Occupational History  . Occupation: retired  Tobacco Use  . Smoking status: Former Smoker    Packs/day: 1.00    Years: 3.00    Pack years: 3.00    Types: Cigarettes    Quit date: 08/15/1969    Years since quitting: 50.1  . Smokeless tobacco: Never Used  Substance and Sexual Activity  . Alcohol use: No    Alcohol/week: 0.0 standard drinks  . Drug use: No  . Sexual activity: Not Currently  Other Topics Concern  . Not on file  Social History Narrative  . Not on file   Social Determinants of Health   Financial Resource Strain: Low Risk   . Difficulty of Paying Living Expenses: Not hard at all  Food Insecurity: No Food Insecurity  . Worried About Charity fundraiser in the Last Year: Never true  . Ran Out of Food in the Last Year: Never true  Transportation Needs: No Transportation Needs  . Lack of Transportation (Medical): No  . Lack of Transportation (Non-Medical): No  Physical Activity: Inactive  . Days of Exercise per Week: 0 days  . Minutes of Exercise per Session: 0 min  Stress: No Stress Concern Present  . Feeling of Stress : Not at all  Social Connections: Not Isolated  . Frequency of Communication with Friends and Family: More than three times a week  . Frequency of Social Gatherings with Friends and Family: More than three times a week  . Attends Religious Services: More than 4 times per year  . Active Member of Clubs or Organizations: Yes  . Attends Archivist Meetings: More than 4 times per year  . Marital Status: Married  Human resources officer Violence: Not At Risk  . Fear of Current or Ex-Partner: No  . Emotionally Abused: No  . Physically Abused: No  . Sexually Abused: No     Review of Systems    General:  No chills, fever, night sweats or weight changes.  Cardiovascular:  No chest pain, dyspnea on exertion, edema, orthopnea, palpitations, paroxysmal nocturnal dyspnea. Dermatological:  No rash, lesions/masses Respiratory: No cough, dyspnea Urologic: No hematuria, dysuria Abdominal:   No nausea, vomiting, diarrhea, bright red blood per rectum, melena, or hematemesis Neurologic:  No visual changes, wkns, changes in mental status. All other systems reviewed and are otherwise negative except as noted above.  Physical Exam    VS:  BP 126/68   Pulse (!) 59   Temp (!) 96.8 F (36 C)   Ht 5' 2"  (1.575 m)   Wt 219 lb (99.3 kg)   SpO2 94%   BMI 40.06 kg/m  , BMI Body mass index is 40.06 kg/m. GEN: Well nourished, well developed, in no acute distress. HEENT: normal. Neck: Supple, no JVD, carotid bruits, or masses. Cardiac: RRR, no murmurs, rubs, or gallops. No clubbing, cyanosis, edema.  Radials/DP/PT 2+ and equal bilaterally.  Respiratory:  Respirations regular and unlabored, clear to auscultation bilaterally. GI: Soft, nontender, nondistended, BS + x 4. MS: no deformity or atrophy. Skin: warm and dry, no rash. Neuro:  Strength and sensation are intact. Psych: Normal affect.  Accessory Clinical Findings    ECG personally reviewed by me today-sinus bradycardia with sinus arrhythmia with occasional premature ventricular complexes  56 bpm- No acute changes  EKG 02/06/2018 Sinus rhythm with frequent PVCs old anterior infarct 77 bpm  Echocardiogram 03/04/2013 Study Conclusions   - Left ventricle: The cavity size was normal. Systolic  function was normal. The estimated ejection fraction was  in the range of 55% to 60%. Wall motion was normal; there  were no regional wall motion abnormalities. Left  ventricular diastolic function parameters were normal.  - Left atrium: The atrium was mildly dilated.     Assessment & Plan   1.  Palpitations-unable to feel palpitations most of the time.  No changes in her breathing or activity tolerance. Continue diltiazem 120 mg daily-started 09/13/2019 and tolerating well. Avoid triggers caffeine, chocolate, EtOH, sleep  apnea, etc. Heart healthy low-sodium diet Increase physical activity as tolerated Continue to monitor  Essential hypertension-BP today 126/68 Continue continue diltiazem 120 mg daily Heart healthy low-sodium diet-salty 6 given Increase physical activity as tolerated  Hyperlipidemia-.LDL 54 02/06/2018 Continue atorvastatin 40 mg daily Heart healthy low-sodium hepatic diet Increase physical activity as tolerated   Obstructive sleep apnea Encouraged complete compliance Educated about benefits of heart health related to CPAP use.  Cirrhosis- unknown etiology.  It is suspected that her fatty liver disease is causing her thrombocytopenia. Continue lactulose 10 g 3 times daily Encouraged weight loss  Morbid obesity-weight today 219 pounds up from 210 pounds on 06/26/2019 Heart healthy low-sodium diet Increase physical activity as tolerated Continue weight loss  Disposition: Follow-up with Dr. Sallyanne Kuster in 3 months.  Jossie Ng. Sanya Kobrin NP-C.       [place] [address] Office [phone] Fax [phone]

## 2019-09-17 ENCOUNTER — Encounter: Payer: Self-pay | Admitting: General Practice

## 2019-09-17 ENCOUNTER — Other Ambulatory Visit: Payer: Self-pay

## 2019-09-17 ENCOUNTER — Ambulatory Visit (INDEPENDENT_AMBULATORY_CARE_PROVIDER_SITE_OTHER): Payer: PPO | Admitting: General Practice

## 2019-09-17 VITALS — BP 126/68 | HR 59 | Temp 96.8°F | Ht 62.0 in | Wt 219.0 lb

## 2019-09-17 DIAGNOSIS — E785 Hyperlipidemia, unspecified: Secondary | ICD-10-CM | POA: Diagnosis not present

## 2019-09-17 DIAGNOSIS — I10 Essential (primary) hypertension: Secondary | ICD-10-CM

## 2019-09-17 DIAGNOSIS — K7469 Other cirrhosis of liver: Secondary | ICD-10-CM | POA: Diagnosis not present

## 2019-09-17 DIAGNOSIS — G4733 Obstructive sleep apnea (adult) (pediatric): Secondary | ICD-10-CM

## 2019-09-17 DIAGNOSIS — I493 Ventricular premature depolarization: Secondary | ICD-10-CM

## 2019-09-17 NOTE — Patient Instructions (Signed)
Reduce your risk of getting COVID-19 With your heart disease it is especially important for people at increased risk of severe illness from COVID-19, and those who live with them, to protect themselves from getting COVID-19. The best way to protect yourself and to help reduce the spread of the virus that causes COVID-19 is to: Marland Kitchen Limit your interactions with other people as much as possible. . Take precautions to prevent getting COVID-19 when you do interact with others. If you start feeling sick and think you may have COVID-19, get in touch with your healthcare provider within 24 hours.  Follow-Up: 3 months  Virtual Visit  Sanda Klein, MD.    At [facility], you and your health needs are our priority.  As part of our continuing mission to provide you with exceptional heart care, we have created designated Provider Care Teams.  These Care Teams include your primary Cardiologist (physician) and Advanced Practice Providers (APPs -  Physician Assistants and Nurse Practitioners) who all work together to provide you with the care you need, when you need it.  Thank you for choosing [facility] at [facility]!!

## 2019-10-01 ENCOUNTER — Encounter: Payer: PPO | Admitting: Family Medicine

## 2019-10-02 ENCOUNTER — Encounter: Payer: PPO | Admitting: Family Medicine

## 2019-10-03 ENCOUNTER — Other Ambulatory Visit: Payer: Self-pay | Admitting: Family Medicine

## 2019-10-03 DIAGNOSIS — E785 Hyperlipidemia, unspecified: Secondary | ICD-10-CM

## 2019-10-03 DIAGNOSIS — E1159 Type 2 diabetes mellitus with other circulatory complications: Secondary | ICD-10-CM

## 2019-10-03 DIAGNOSIS — R600 Localized edema: Secondary | ICD-10-CM

## 2019-10-03 DIAGNOSIS — E1169 Type 2 diabetes mellitus with other specified complication: Secondary | ICD-10-CM

## 2019-10-04 ENCOUNTER — Ambulatory Visit (HOSPITAL_COMMUNITY)
Admission: RE | Admit: 2019-10-04 | Discharge: 2019-10-04 | Disposition: A | Discharge status: Home / Self Care | Payer: PPO | Source: Ambulatory Visit | Attending: Nurse Practitioner | Admitting: Nurse Practitioner

## 2019-10-04 ENCOUNTER — Other Ambulatory Visit: Payer: Self-pay

## 2019-10-04 ENCOUNTER — Inpatient Hospital Stay (HOSPITAL_COMMUNITY): Payer: PPO | Attending: Hematology

## 2019-10-04 DIAGNOSIS — R911 Solitary pulmonary nodule: Secondary | ICD-10-CM | POA: Diagnosis not present

## 2019-10-04 DIAGNOSIS — R918 Other nonspecific abnormal finding of lung field: Secondary | ICD-10-CM | POA: Diagnosis not present

## 2019-10-04 DIAGNOSIS — D696 Thrombocytopenia, unspecified: Secondary | ICD-10-CM | POA: Insufficient documentation

## 2019-10-04 LAB — COMPREHENSIVE METABOLIC PANEL
ALT: 37 U/L (ref 0–44)
AST: 54 U/L — ABNORMAL HIGH (ref 15–41)
Albumin: 3.2 g/dL — ABNORMAL LOW (ref 3.5–5.0)
Alkaline Phosphatase: 92 U/L (ref 38–126)
Anion gap: 10 (ref 5–15)
BUN: 21 mg/dL (ref 8–23)
CO2: 27 mmol/L (ref 22–32)
Calcium: 9 mg/dL (ref 8.9–10.3)
Chloride: 104 mmol/L (ref 98–111)
Creatinine, Ser: 0.84 mg/dL (ref 0.44–1.00)
GFR calc Af Amer: >60 mL/min (ref >60)
GFR calc non Af Amer: >60 mL/min (ref >60)
Glucose, Bld: 175 mg/dL — ABNORMAL HIGH (ref 70–99)
Potassium: 4.1 mmol/L (ref 3.5–5.1)
Sodium: 141 mmol/L (ref 135–145)
Total Bilirubin: 2.1 mg/dL — ABNORMAL HIGH (ref 0.3–1.2)
Total Protein: 5.8 g/dL — ABNORMAL LOW (ref 6.5–8.1)

## 2019-10-04 LAB — CBC WITH DIFFERENTIAL/PLATELET
Abs Immature Granulocytes: 0.01 10*3/uL (ref 0.00–0.07)
Basophils Absolute: 0 10*3/uL (ref 0.0–0.1)
Basophils Relative: 0 %
Eosinophils Absolute: 0.3 10*3/uL (ref 0.0–0.5)
Eosinophils Relative: 6 %
HCT: 39.6 % (ref 36.0–46.0)
Hemoglobin: 12.9 g/dL (ref 12.0–15.0)
Immature Granulocytes: 0 %
Lymphocytes Relative: 35 %
Lymphs Abs: 1.9 10*3/uL (ref 0.7–4.0)
MCH: 31.7 pg (ref 26.0–34.0)
MCHC: 32.6 g/dL (ref 30.0–36.0)
MCV: 97.3 fL (ref 80.0–100.0)
Monocytes Absolute: 0.5 10*3/uL (ref 0.1–1.0)
Monocytes Relative: 9 %
Neutro Abs: 2.8 10*3/uL (ref 1.7–7.7)
Neutrophils Relative %: 50 %
Platelets: 81 10*3/uL — ABNORMAL LOW (ref 150–400)
RBC: 4.07 MIL/uL (ref 3.87–5.11)
RDW: 13.2 % (ref 11.5–15.5)
WBC: 5.5 10*3/uL (ref 4.0–10.5)
nRBC: 0 % (ref 0.0–0.2)

## 2019-10-04 LAB — VITAMIN B12: Vitamin B-12: 344 pg/mL (ref 180–914)

## 2019-10-04 LAB — VITAMIN D 25 HYDROXY (VIT D DEFICIENCY, FRACTURES): Vit D, 25-Hydroxy: 46.62 ng/mL (ref 30–100)

## 2019-10-04 LAB — LACTATE DEHYDROGENASE: LDH: 172 U/L (ref 98–192)

## 2019-10-04 MED ORDER — IOHEXOL 300 MG/ML  SOLN
75.0000 mL | Freq: Once | INTRAMUSCULAR | Status: AC | PRN
  Administered 2019-10-04: 09:00:00 75 mL via INTRAVENOUS

## 2019-10-10 ENCOUNTER — Other Ambulatory Visit: Payer: Self-pay | Admitting: Family Medicine

## 2019-10-10 ENCOUNTER — Other Ambulatory Visit: Payer: Self-pay | Admitting: Gastroenterology

## 2019-10-10 ENCOUNTER — Encounter (HOSPITAL_COMMUNITY): Payer: Self-pay | Admitting: Hematology

## 2019-10-10 ENCOUNTER — Inpatient Hospital Stay (HOSPITAL_BASED_OUTPATIENT_CLINIC_OR_DEPARTMENT_OTHER): Payer: PPO | Admitting: Hematology

## 2019-10-10 DIAGNOSIS — F411 Generalized anxiety disorder: Secondary | ICD-10-CM

## 2019-10-10 DIAGNOSIS — D696 Thrombocytopenia, unspecified: Secondary | ICD-10-CM

## 2019-10-10 DIAGNOSIS — E1169 Type 2 diabetes mellitus with other specified complication: Secondary | ICD-10-CM

## 2019-10-10 DIAGNOSIS — I152 Hypertension secondary to endocrine disorders: Secondary | ICD-10-CM

## 2019-10-10 DIAGNOSIS — K219 Gastro-esophageal reflux disease without esophagitis: Secondary | ICD-10-CM

## 2019-10-10 DIAGNOSIS — E785 Hyperlipidemia, unspecified: Secondary | ICD-10-CM

## 2019-10-10 DIAGNOSIS — E1159 Type 2 diabetes mellitus with other circulatory complications: Secondary | ICD-10-CM

## 2019-10-10 NOTE — Progress Notes (Signed)
Virtual Visit via Telephone Note  I connected with Stacey Summers on 10/10/19 at 11:30 AM EST by telephone and verified that I am speaking with the correct person using two identifiers.   I discussed the limitations, risks, security and privacy concerns of performing an evaluation and management service by telephone and the availability of in person appointments. I also discussed with the patient that there may be a patient responsible charge related to this service. The patient expressed understanding and agreed to proceed.   History of Present Illness: She is seen in our clinic for thrombocytopenia in the setting of splenomegaly and cirrhosis.  She also had a history of pulmonary nodules which were followed up.   Observations/Objective: Her daughter is also along with her on the speaker phone.  Patient did not have any fevers or night sweats or weight loss in the last few months.  No bleeding issues reported.  She does report occasional nausea but not every day.  She has been a non-smoker since 24.  Denies any bleeding per rectum or melena.  Assessment and Plan:  1.  Thrombocytopenia: -Platelet count is ranging anywhere between 80-1 30. -CT of the AP on 06/27/2018 reviewed by me showed spleen is mildly enlarged.  She has cirrhosis of the liver. -We reviewed labs which showed platelet count 81.  B12 was normal. 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  She had many questions about cirrhosis and portal hypertension which were answered. -I have told her to follow-up and 4 months with repeat CBC.  She was told to follow-up sooner should she develop any bleeding.  2.  Lung nodules: -I have independently reviewed CT of the chest from 10/04/2019 which showed resolution of recent COVID-19 pneumonia.  Bilateral pulmonary nodules, less than 4 mm in size have been stable. -She is a non-smoker and has not smoked cigarettes since 1971.  Hence I did not recommend any follow-up CT scans.   Follow  Up Instructions: RTC 4 months with labs.   I discussed the assessment and treatment plan with the patient. The patient was provided an opportunity to ask questions and all were answered. The patient agreed with the plan and demonstrated an understanding of the instructions.   The patient was advised to call back or seek an in-person evaluation if the symptoms worsen or if the condition fails to improve as anticipated.  I provided 12 minutes of non-face-to-face time during this encounter.   Derek Jack, MD

## 2019-10-11 NOTE — Telephone Encounter (Signed)
Has to have OV for controlled substances.  Patient signed contract that she would be seen for each refill.  If cannot wait until in office visit, ok to schedule as a televisit for Monday.

## 2019-10-12 ENCOUNTER — Other Ambulatory Visit: Payer: Self-pay | Admitting: Family Medicine

## 2019-10-12 DIAGNOSIS — K219 Gastro-esophageal reflux disease without esophagitis: Secondary | ICD-10-CM

## 2019-10-14 ENCOUNTER — Ambulatory Visit (INDEPENDENT_AMBULATORY_CARE_PROVIDER_SITE_OTHER): Payer: PPO | Admitting: Family Medicine

## 2019-10-14 ENCOUNTER — Telehealth: Payer: Self-pay | Admitting: Gastroenterology

## 2019-10-14 DIAGNOSIS — F411 Generalized anxiety disorder: Secondary | ICD-10-CM | POA: Diagnosis not present

## 2019-10-14 DIAGNOSIS — R42 Dizziness and giddiness: Secondary | ICD-10-CM | POA: Diagnosis not present

## 2019-10-14 LAB — HM DIABETES EYE EXAM

## 2019-10-14 MED ORDER — MECLIZINE HCL 25 MG PO TABS: 25.0000 mg | ORAL_TABLET | Freq: Three times a day (TID) | ORAL | 0 refills | Status: DC | PRN

## 2019-10-14 MED ORDER — ALPRAZOLAM 0.25 MG PO TABS: 0.2500 mg | ORAL_TABLET | Freq: Two times a day (BID) | ORAL | 1 refills | Status: DC | PRN

## 2019-10-14 NOTE — Progress Notes (Signed)
Telephone visit  Subjective: CC: f/u panic PCP: Janora Norlander, DO PTW:Stacey Summers is a 74 y.o. female calls for telephone consult today. Patient provides verbal consent for consult held via phone.  Due to COVID-19 pandemic this visit was conducted virtually. This visit type was conducted due to national recommendations for restrictions regarding the COVID-19 Pandemic (e.g. social distancing, sheltering in place) in an effort to limit this patient's exposure and mitigate transmission in our community. All issues noted in this document were discussed and addressed.  A physical exam was not performed with this format.   Location of patient: home Location of provider: Working remotely from home Others present for call: daughter  1. Panic attacks She needs refills on her Xanax, which she uses PRN panic attacks.  She identifies panic as when she is having heart palpitations that is not relieved by her medication prescribed by the cardiologist.  Often the alprazolam will resolve these episodes.  She denies falls, memory loss.  She reports some daytime naps but no excessive daytime sleepiness or respiratory depression.  2.  Vertigo Patient reports that she had an episode of dizziness over the weekend that seem to be related to certain positions.  She had meclizine prescribed many years ago but did not have any of this on hand and wanted to know if she could get a refill.  Symptoms seem to be better today.  ROS: Per HPI  Allergies  Allergen Reactions  . Latex     "my hands feels numb"   Past Medical History:  Diagnosis Date  . Allergy    seasonal  . Anxiety   . Arthritis    hands, back   . Cataract    surgery to remove  . Chronic back pain    lower back   . Cirrhosis of liver without ascites ([place])    recent dx 07/11/18  . COPD (chronic obstructive pulmonary disease) (HCC)    no inhaler  . Diabetes mellitus without complication ([place])   . DJD (degenerative joint disease)    . Fibromyalgia   . Genetic testing 03/08/2017   Ms. Shenberger underwent genetic counseling and testing for hereditary cancer syndromes on 02/21/2017. Her results were negative for mutations in all 83 genes analyzed by [facility]'s 83-gene Multi-Cancers Panel. Genes analyzed include: ALK, APC, ATM, AXIN2, BAP1, BARD1, BLM, BMPR1A, BRCA1, BRCA2, BRIP1, CASR, CDC73, CDH1, CDK4, CDKN1B, CDKN1C, CDKN2A, CEBPA, CHEK2, CTNNA1, DICER1, DIS3L2, EGFR, EPCAM  . GERD (gastroesophageal reflux disease)   . 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 History of abnormal mammogram   . Hyperlipidemia   . Hypertension   . Irregular heart beat   . Neuromuscular disorder (HCC)    fibromyalgia  . Sleep apnea    cpap some  . SVD (spontaneous vaginal delivery)    x 2  . Tubular adenoma of colon 05/09/12   "innumerable number" of flat polyps    Current Outpatient Medications:  .  ALPRAZolam (XANAX) 0.25 MG tablet, Take 1 tablet (0.25 mg total) by mouth 2 (two) times daily as needed for anxiety. (Patient not taking: Reported on 10/10/2019), Disp: 30 tablet, Rfl: 0 .  atorvastatin (LIPITOR) 40 MG tablet, Take 1 tablet by mouth once daily, Disp: 90 tablet, Rfl: 0 .  benazepril (LOTENSIN) 40 MG tablet, Take 1 tablet by mouth once daily, Disp: 90 tablet, Rfl: 0 .  blood glucose meter kit and supplies KIT, Dispense based on patient and insurance preference. Use up  to four times daily as directed. (FOR ICD-9 250.00, 250.01)., Disp: 1 each, Rfl: 10 .  Blood Glucose Monitoring Suppl (LIBERTY BLOOD GLUCOSE MONITOR) DEVI, 1 each by Does not apply route daily. Test 1x per day and prn-dx250.02, Disp: 1 each, Rfl: 0 .  Cholecalciferol (VITAMIN D3) 5000 units CAPS, Take 5,000 Units by mouth daily., Disp: , Rfl:  .  cyclobenzaprine (FLEXERIL) 5 MG tablet, TAKE 1 TABLET BY MOUTH THREE TIMES DAILY AS NEEDED FOR MUSCLE SPASMS (NEEDS APPOINTMENT) (Patient not taking: Reported on 10/10/2019), Disp: 30 tablet, Rfl: 0 .  diltiazem  (DILTIAZEM CD) 120 MG 24 hr capsule, Take 1 capsule (120 mg total) by mouth daily., Disp: 90 capsule, Rfl: 1 .  furosemide (LASIX) 40 MG tablet, TAKE 1 TABLET BY MOUTH IN THE MORNING & 1/2 (ONE-HALF)TABLET AT 1 IN THE AFTERNOON, Disp: 135 tablet, Rfl: 0 .  glucose blood test strip, Test 1x per day and prn- dx 250.02, Disp: 100 each, Rfl: 2 .  lactulose (CHRONULAC) 10 GM/15ML solution, TAKE 15MLS BY MOUTH THREE TIMES DAILY, Disp: 473 mL, Rfl: 3 .  meclizine (ANTIVERT) 25 MG tablet, Take 1 tablet (25 mg total) by mouth 3 (three) times daily as needed for dizziness. (Patient not taking: Reported on 10/10/2019), Disp: 30 tablet, Rfl: 0 .  metFORMIN (GLUCOPHAGE) 500 MG tablet, Take 1 tablet (500 mg total) by mouth 2 (two) times daily with a meal. (Patient taking differently: Take 500 mg by mouth daily. ), Disp: 180 tablet, Rfl: 1 .  omeprazole (PRILOSEC) 40 MG capsule, Take 1 capsule by mouth once daily, Disp: 30 capsule, Rfl: 0 .  ondansetron (ZOFRAN) 4 MG tablet, Take 1 tablet (4 mg total) by mouth every 8 (eight) hours as needed for nausea or vomiting. (Patient not taking: Reported on 10/10/2019), Disp: 30 tablet, Rfl: 1 .  ONE TOUCH ULTRA TEST test strip, USE ONE STRIP TO CHECK GLUCOSE ONCE DAILY, Disp: 100 each, Rfl: 1 .  ONETOUCH DELICA LANCETS 30S MISC, USE ONE LANCET TO CHECK GLUCOSE ONCE DAILY AND AS NEEDED, Disp: 100 each, Rfl: 0 .  rifaximin (XIFAXAN) 550 MG TABS tablet, Take 1 tablet (550 mg total) by mouth 2 (two) times daily., Disp: 60 tablet, Rfl: 3 .  traMADol (ULTRAM) 50 MG tablet, Take 1 tablet (50 mg total) by mouth every 12 (twelve) hours as needed for moderate pain. (Patient not taking: Reported on 10/10/2019), Disp: 30 tablet, Rfl: 1  Assessment/ Plan: 74 y.o. female   1. GAD (generalized anxiety disorder) Intermittent panic attacks.  Patient uses the alprazolam very sparingly.  Her last refill was August 2020.  I have sent in a renewal of the medication per to have on hand if needed.   The national narcotic database was reviewed and there were no red flags.  She is up-to-date with her controlled substance contract and UDS through August of this year. - ALPRAZolam (XANAX) 0.25 MG tablet; Take 1 tablet (0.25 mg total) by mouth 2 (two) times daily as needed for anxiety.  Dispense: 30 tablet; Refill: 1  2. Vertigo Intermittent and has not occurred since 2018.  She had a spell over the weekend and therefore we will give her some meclizine to have on hand if needed.  She will follow as needed on this issue - meclizine (ANTIVERT) 25 MG tablet; Take 1 tablet (25 mg total) by mouth 3 (three) times daily as needed for dizziness.  Dispense: 30 tablet; Refill: 0   Start time: 2:23pm End time: 2:31pm  Total time spent on patient care (including telephone call/ virtual visit): 15 minutes  [place], [place] [phone]

## 2019-10-14 NOTE — Telephone Encounter (Signed)
Dr Silverio Decamp the pt's daughter states that the pt had a CT scan of the chest and the report says "advanced" cirrhosis.  She has an appt on 4/21 with you for follow up but the daughter would like you to call her to discuss.  She is very worried about the term "advanced".  McDaniels,Lisa [phone]

## 2019-10-14 NOTE — Telephone Encounter (Signed)
We had this discussion multiple times previously that she has cirrhosis with decompensation. Please schedule office visit to discuss her concerns in detail. Thank you

## 2019-10-14 NOTE — Telephone Encounter (Signed)
Ok, thank you

## 2019-10-14 NOTE — Telephone Encounter (Signed)
OV 10/25/19

## 2019-10-14 NOTE — Telephone Encounter (Signed)
The pt's daughter has been advised and appt has already been made for April.  The pt's daughter has been advised of the information and verbalized understanding.

## 2019-10-17 ENCOUNTER — Telehealth: Payer: Self-pay | Admitting: Gastroenterology

## 2019-10-17 NOTE — Telephone Encounter (Signed)
Bowel movements are hard. Is it okay to take an extra dose or two of Lactulose?

## 2019-10-21 NOTE — Telephone Encounter (Signed)
She is on Lactulose for hepatic encephalopathy- so goal should be 2-3 loose stools per day - if stools are hard she definitely needs more lactulose- ask her to increase to 30 cc TID

## 2019-10-21 NOTE — Telephone Encounter (Signed)
Daughter advised.

## 2019-10-25 ENCOUNTER — Encounter: Payer: Self-pay | Admitting: Family Medicine

## 2019-10-25 ENCOUNTER — Encounter: Payer: PPO | Admitting: Family Medicine

## 2019-10-25 ENCOUNTER — Other Ambulatory Visit: Payer: Self-pay

## 2019-10-25 ENCOUNTER — Ambulatory Visit (INDEPENDENT_AMBULATORY_CARE_PROVIDER_SITE_OTHER): Payer: PPO | Admitting: Family Medicine

## 2019-10-25 VITALS — BP 126/61 | HR 60 | Temp 97.8°F | Ht 62.0 in | Wt 228.0 lb

## 2019-10-25 DIAGNOSIS — Z0001 Encounter for general adult medical examination with abnormal findings: Secondary | ICD-10-CM

## 2019-10-25 DIAGNOSIS — Z124 Encounter for screening for malignant neoplasm of cervix: Secondary | ICD-10-CM

## 2019-10-25 DIAGNOSIS — Z01419 Encounter for gynecological examination (general) (routine) without abnormal findings: Secondary | ICD-10-CM

## 2019-10-25 DIAGNOSIS — G8929 Other chronic pain: Secondary | ICD-10-CM

## 2019-10-25 DIAGNOSIS — M5442 Lumbago with sciatica, left side: Secondary | ICD-10-CM

## 2019-10-25 DIAGNOSIS — F411 Generalized anxiety disorder: Secondary | ICD-10-CM

## 2019-10-25 DIAGNOSIS — H6121 Impacted cerumen, right ear: Secondary | ICD-10-CM

## 2019-10-25 DIAGNOSIS — Z01411 Encounter for gynecological examination (general) (routine) with abnormal findings: Secondary | ICD-10-CM | POA: Diagnosis not present

## 2019-10-25 DIAGNOSIS — Z0000 Encounter for general adult medical examination without abnormal findings: Secondary | ICD-10-CM | POA: Diagnosis not present

## 2019-10-25 MED ORDER — TRAMADOL HCL 50 MG PO TABS: 50.0000 mg | ORAL_TABLET | Freq: Two times a day (BID) | ORAL | 1 refills | Status: DC | PRN

## 2019-10-25 NOTE — Progress Notes (Signed)
Stacey Summers is a 74 y.o. female presents to office today for annual physical exam examination.    Concerns today include: 1.  Decreased hearing Patient reports bilateral decreased hearing.  She feels that she has fullness in bilateral ears.  Has not had any pain from the ears.  No dizziness or falls.  2.  Chronic back pain Patient reports good control with as needed use of tramadol of her chronic back pain.  Denies any falls, respiratory depression, confusion or change in mentation.  Diet: fair, Exercise: no structured Last eye exam: UTD Last dental exam: UTD Last colonoscopy: UTD Last mammogram: UTD Last pap smear: wants Refills needed today: Tramadol Immunizations needed: UTD  Past Medical History:  Diagnosis Date  . Allergy    seasonal  . Anxiety   . Arthritis    hands, back   . Cataract    surgery to remove  . Chronic back pain    lower back   . Cirrhosis of liver without ascites ([place])    recent dx 07/11/18  . COPD (chronic obstructive pulmonary disease) (HCC)    no inhaler  . Diabetes mellitus without complication ([place])   . DJD (degenerative joint disease)   . Fibromyalgia   . Genetic testing 03/08/2017   Ms. Streat underwent genetic counseling and testing for hereditary cancer syndromes on 02/21/2017. Her results were negative for mutations in all 83 genes analyzed by [facility]'s 83-gene Multi-Cancers Panel. Genes analyzed include: ALK, APC, ATM, AXIN2, BAP1, BARD1, BLM, BMPR1A, BRCA1, BRCA2, BRIP1, CASR, CDC73, CDH1, CDK4, CDKN1B, CDKN1C, CDKN2A, CEBPA, CHEK2, CTNNA1, DICER1, DIS3L2, EGFR, EPCAM  . GERD (gastroesophageal reflux disease)   . 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 History of abnormal mammogram   . Hyperlipidemia   . Hypertension   . Irregular heart beat   . Neuromuscular disorder (HCC)    fibromyalgia  . Sleep apnea    cpap some  . SVD (spontaneous vaginal delivery)    x 2  . Tubular adenoma of colon 05/09/12   "innumerable  number" of flat polyps   Social History   Socioeconomic History  . Marital status: Married    Spouse name: [name]  . Number of children: 3  . Years of education: 34  . Highest education level: High school graduate  Occupational History  . Occupation: retired  Tobacco Use  . Smoking status: Former Smoker    Packs/day: 1.00    Years: 3.00    Pack years: 3.00    Types: Cigarettes    Quit date: 08/15/1969    Years since quitting: 50.2  . Smokeless tobacco: Never Used  Substance and Sexual Activity  . Alcohol use: No    Alcohol/week: 0.0 standard drinks  . Drug use: No  . Sexual activity: Not Currently  Other Topics Concern  . Not on file  Social History Narrative  . Not on file   Social Determinants of Health   Financial Resource Strain: Low Risk   . Difficulty of Paying Living Expenses: Not hard at all  Food Insecurity: No Food Insecurity  . Worried About Charity fundraiser in the Last Year: Never true  . Ran Out of Food in the Last Year: Never true  Transportation Needs: No Transportation Needs  . Lack of Transportation (Medical): No  . Lack of Transportation (Non-Medical): No  Physical Activity: Inactive  . Days of Exercise per Week: 0 days  . Minutes of Exercise per Session: 0  min  Stress: No Stress Concern Present  . Feeling of Stress : Not at all  Social Connections: Not Isolated  . Frequency of Communication with Friends and Family: More than three times a week  . Frequency of Social Gatherings with Friends and Family: More than three times a week  . Attends Religious Services: More than 4 times per year  . Active Member of Clubs or Organizations: Yes  . Attends Archivist Meetings: More than 4 times per year  . Marital Status: Married  Human resources officer Violence: Not At Risk  . Fear of Current or Ex-Partner: No  . Emotionally Abused: No  . Physically Abused: No  . Sexually Abused: No   Past Surgical History:  Procedure Laterality Date  . CARPAL  TUNNEL RELEASE Left 10/20/14   [facility]  . CATARACT EXTRACTION, BILATERAL    . [place]  . CHOLECYSTECTOMY  1990  . COLONOSCOPY    . IR RADIOLOGIST EVAL & MGMT  07/16/2019  . KNEE ARTHROSCOPY WITH SUBCHONDROPLASTY Right 03/27/2018   Procedure: Right knee arthroscopic partial medial meniscus repair versus menisectomy with medial tibia subchondroplasty;  Surgeon: Nicholes Stairs, MD;  Location: [facility];  Service: Orthopedics;  Laterality: Right;  120 mins  . LARYNX SURGERY     POLYP REMOVAL  . MULTIPLE TOOTH EXTRACTIONS    . POLYPECTOMY     vocal cord  . TONSILLECTOMY  1965  . TUBAL LIGATION    . TUMOR REMOVAL     benign fatty tumors on abdomen  . UPPER GASTROINTESTINAL ENDOSCOPY     Family History  Problem Relation Age of Onset  . Prostate cancer Father        d.89 diagnosed in late-60s  . Colon cancer Father        diagnosed at 67  . Heart failure Mother        d.37  . Heart disease Mother   . Colon polyps Brother        polyposis. Underwent partial colectomy at age 27.  Marland Kitchen Spina bifida Brother        died at birth  . Stomach cancer Paternal Aunt        questionable  . Kidney cancer Cousin        d.78s maternal first-cousin  . Other Other 16       multiple lipomas  . Colon cancer Paternal Uncle        in his 51s  . Heart failure Maternal Grandmother        d.45  . Heart failure Maternal Uncle        d.50  . Colon polyps Son 31  . Heart attack Son 10  . Leukemia Maternal Aunt   . Esophageal cancer Neg Hx   . Rectal cancer Neg Hx     Current Outpatient Medications:  .  ALPRAZolam (XANAX) 0.25 MG tablet, Take 1 tablet (0.25 mg total) by mouth 2 (two) times daily as needed for anxiety., Disp: 30 tablet, Rfl: 1 .  atorvastatin (LIPITOR) 40 MG tablet, Take 1 tablet by mouth once daily, Disp: 90 tablet, Rfl: 0 .  benazepril (LOTENSIN) 40 MG tablet, Take 1 tablet by mouth once daily, Disp: 90 tablet, Rfl: 0 .  blood glucose meter kit and supplies KIT,  Dispense based on patient and insurance preference. Use up to four times daily as directed. (FOR ICD-9 250.00, 250.01)., Disp: 1 each, Rfl: 10 .  Blood Glucose Monitoring Suppl (LIBERTY BLOOD  GLUCOSE MONITOR) DEVI, 1 each by Does not apply route daily. Test 1x per day and prn-dx250.02, Disp: 1 each, Rfl: 0 .  Cholecalciferol (VITAMIN D3) 5000 units CAPS, Take 5,000 Units by mouth daily., Disp: , Rfl:  .  cyclobenzaprine (FLEXERIL) 5 MG tablet, TAKE 1 TABLET BY MOUTH THREE TIMES DAILY AS NEEDED FOR MUSCLE SPASMS (NEEDS APPOINTMENT), Disp: 30 tablet, Rfl: 0 .  diltiazem (DILTIAZEM CD) 120 MG 24 hr capsule, Take 1 capsule (120 mg total) by mouth daily., Disp: 90 capsule, Rfl: 1 .  furosemide (LASIX) 40 MG tablet, TAKE 1 TABLET BY MOUTH IN THE MORNING & 1/2 (ONE-HALF)TABLET AT 1 IN THE AFTERNOON, Disp: 135 tablet, Rfl: 0 .  glucose blood test strip, Test 1x per day and prn- dx 250.02, Disp: 100 each, Rfl: 2 .  lactulose (CHRONULAC) 10 GM/15ML solution, TAKE 15MLS BY MOUTH THREE TIMES DAILY, Disp: 473 mL, Rfl: 3 .  meclizine (ANTIVERT) 25 MG tablet, Take 1 tablet (25 mg total) by mouth 3 (three) times daily as needed for dizziness., Disp: 30 tablet, Rfl: 0 .  metFORMIN (GLUCOPHAGE) 500 MG tablet, Take 1 tablet (500 mg total) by mouth 2 (two) times daily with a meal. (Patient taking differently: Take 500 mg by mouth daily. ), Disp: 180 tablet, Rfl: 1 .  omeprazole (PRILOSEC) 40 MG capsule, Take 1 capsule by mouth once daily, Disp: 30 capsule, Rfl: 0 .  ondansetron (ZOFRAN) 4 MG tablet, Take 1 tablet (4 mg total) by mouth every 8 (eight) hours as needed for nausea or vomiting., Disp: 30 tablet, Rfl: 1 .  ONE TOUCH ULTRA TEST test strip, USE ONE STRIP TO CHECK GLUCOSE ONCE DAILY, Disp: 100 each, Rfl: 1 .  ONETOUCH DELICA LANCETS 67M MISC, USE ONE LANCET TO CHECK GLUCOSE ONCE DAILY AND AS NEEDED, Disp: 100 each, Rfl: 0 .  rifaximin (XIFAXAN) 550 MG TABS tablet, Take 1 tablet (550 mg total) by mouth 2 (two)  times daily., Disp: 60 tablet, Rfl: 3 .  traMADol (ULTRAM) 50 MG tablet, Take 1 tablet (50 mg total) by mouth every 12 (twelve) hours as needed for moderate pain., Disp: 30 tablet, Rfl: 1  Allergies  Allergen Reactions  . Latex     "my hands feels numb"     ROS: Review of Systems Pertinent items noted in HPI and remainder of comprehensive ROS otherwise negative.    Physical exam BP 126/61   Pulse 60   Temp 97.8 F (36.6 C) (Temporal)   Ht 5' 2"  (1.575 m)   Wt 228 lb (103.4 kg)   SpO2 97%   BMI 41.70 kg/m  General appearance: alert, cooperative, appears stated age and no distress Head: Normocephalic, without obvious abnormality, atraumatic Eyes: negative findings: lids and lashes normal, conjunctivae and sclerae normal, corneas clear and pupils equal, round, reactive to light and accomodation Ears: Right tympanic membrane was obscured by cerumen.  Left tympanic membrane without evidence of infection.  Normal light reflex. Nose: Nares normal. Septum midline. Mucosa normal. No drainage or sinus tenderness. Throat: False uppers Neck: no adenopathy, no carotid bruit, no JVD, supple, symmetrical, trachea midline and thyroid not enlarged, symmetric, no tenderness/mass/nodules Back: symmetric, no curvature. ROM normal. No CVA tenderness. Lungs: clear to auscultation bilaterally Heart: regular rate and rhythm, S1, S2 normal, no murmur, click, rub or gallop Abdomen: soft, non-tender; bowel sounds normal; no masses,  no organomegaly and obese Pelvic: no adnexal masses or tenderness, no cervical motion tenderness, rectovaginal septum normal, uterus normal size, shape, and  consistency, vagina normal without discharge and Cervix appears stenosed.  External vaginal tissue atrophic Extremities: edema +1 pitting edema to mid shins Pulses: 2+ and symmetric Skin: Skin color, texture, turgor normal. No rashes or lesions Lymph nodes: Cervical, supraclavicular, and axillary nodes normal. Neurologic:  Alert and oriented X 3, normal strength and tone. Normal symmetric reflexes. Normal coordination and gait Psych: Mood stable, speech normal, affect appropriate, pleasant and interactive.  Good eye contact. Depression screen [facility] 2/9 10/25/2019 04/02/2019 01/21/2019  Decreased Interest 0 - 0  Down, Depressed, Hopeless 0 0 0  PHQ - 2 Score 0 0 0  Altered sleeping 1 0 -  Tired, decreased energy 2 0 -  Change in appetite 0 0 -  Feeling bad or failure about yourself  0 0 -  Trouble concentrating 0 0 -  Moving slowly or fidgety/restless 0 0 -  Suicidal thoughts 0 0 -  PHQ-9 Score 3 0 -  Some recent data might be hidden    Assessment/ Plan: Deberah Castle Brar here for annual physical exam.   1. Well woman exam with routine gynecological exam - IGP, Aptima HPV  2. Screening for malignant neoplasm of cervix - IGP, Aptima HPV  3. GAD (generalized anxiety disorder) Stable.  4. Chronic left-sided low back pain with left-sided sciatica Stable and well controlled with as needed use of Ultram.  UDS and controlled substance contract up-to-date.  National narcotic database was reviewed and there were no red flags. - traMADol (ULTRAM) 50 MG tablet; Take 1 tablet (50 mg total) by mouth every 12 (twelve) hours as needed for moderate pain.  Dispense: 30 tablet; Refill: 1  5. Impacted cerumen of right ear Irrigated successfully here in office.  No evidence of secondary infection.   Handout on healthy lifestyle choices, including diet (rich in fruits, vegetables and lean meats and low in salt and simple carbohydrates) and exercise (at least 30 minutes of moderate physical activity daily).  She will follow-up in the next couple months for follow-up on her diabetes.  At that time we will perform diabetic foot exam.  We will need to get release of information for her eye exam, which was done previously.  Rayetta Veith M. [name], DO

## 2019-10-25 NOTE — Patient Instructions (Addendum)
I put in your Xanax 10/14/2019.  I have sent the Tramadol in.  You had labs performed today.  You will be contacted with the results of the labs once they are available, usually in the next 3 business days for routine lab work.  If you have an active my chart account, they will be released to your MyChart.  If you prefer to have these labs released to you via telephone, please let us know.  If you had a pap smear or biopsy performed, expect to be contacted in about 7-10 days.  This should be your last pap.   Preventive Care 19 Years and Older, Female Preventive care refers to lifestyle choices and visits with your health care provider that can promote health and wellness. This includes:  A yearly physical exam. This is also called an annual well check.  Regular dental and eye exams.  Immunizations.  Screening for certain conditions.  Healthy lifestyle choices, such as diet and exercise. What can I expect for my preventive care visit? Physical exam Your health care provider will check:  Height and weight. These may be used to calculate body mass index (BMI), which is a measurement that tells if you are at a healthy weight.  Heart rate and blood pressure.  Your skin for abnormal spots. Counseling Your health care provider may ask you questions about:  Alcohol, tobacco, and drug use.  Emotional well-being.  Home and relationship well-being.  Sexual activity.  Eating habits.  History of falls.  Memory and ability to understand (cognition).  Work and work Statistician.  Pregnancy and menstrual history. What immunizations do I need?  Influenza (flu) vaccine  This is recommended every year. Tetanus, diphtheria, and pertussis (Tdap) vaccine  You may need a Td booster every 10 years. Varicella (chickenpox) vaccine  You may need this vaccine if you have not already been vaccinated. Zoster (shingles) vaccine  You may need this after age 65. Pneumococcal conjugate  (PCV13) vaccine  One dose is recommended after age 88. Pneumococcal polysaccharide (PPSV23) vaccine  One dose is recommended after age 17. Measles, mumps, and rubella (MMR) vaccine  You may need at least one dose of MMR if you were born in 1957 or later. You may also need a second dose. Meningococcal conjugate (MenACWY) vaccine  You may need this if you have certain conditions. Hepatitis A vaccine  You may need this if you have certain conditions or if you travel or work in places where you may be exposed to hepatitis A. Hepatitis B vaccine  You may need this if you have certain conditions or if you travel or work in places where you may be exposed to hepatitis B. Haemophilus influenzae type b (Hib) vaccine  You may need this if you have certain conditions. You may receive vaccines as individual doses or as more than one vaccine together in one shot (combination vaccines). Talk with your health care provider about the risks and benefits of combination vaccines. What tests do I need? Blood tests  Lipid and cholesterol levels. These may be checked every 5 years, or more frequently depending on your overall health.  Hepatitis C test.  Hepatitis B test. Screening  Lung cancer screening. You may have this screening every year starting at age 51 if you have a 30-pack-year history of smoking and currently smoke or have quit within the past 15 years.  Colorectal cancer screening. All adults should have this screening starting at age 9 and continuing until age 90. Your  health care provider may recommend screening at age 69 if you are at increased risk. You will have tests every 1-10 years, depending on your results and the type of screening test.  Diabetes screening. This is done by checking your blood sugar (glucose) after you have not eaten for a while (fasting). You may have this done every 1-3 years.  Mammogram. This may be done every 1-2 years. Talk with your health care provider  about how often you should have regular mammograms.  BRCA-related cancer screening. This may be done if you have a family history of breast, ovarian, tubal, or peritoneal cancers. Other tests  Sexually transmitted disease (STD) testing.  Bone density scan. This is done to screen for osteoporosis. You may have this done starting at age 60. Follow these instructions at home: Eating and drinking  Eat a diet that includes fresh fruits and vegetables, whole grains, lean protein, and low-fat dairy products. Limit your intake of foods with high amounts of sugar, saturated fats, and salt.  Take vitamin and mineral supplements as recommended by your health care provider.  Do not drink alcohol if your health care provider tells you not to drink.  If you drink alcohol: ? Limit how much you have to 0-1 drink a day. ? Be aware of how much alcohol is in your drink. In the [place] one drink equals one 12 oz bottle of beer (355 mL), one 5 oz glass of wine (148 mL), or one 1 oz glass of hard liquor (44 mL). Lifestyle  Take daily care of your teeth and gums.  Stay active. Exercise for at least 30 minutes on 5 or more days each week.  Do not use any products that contain nicotine or tobacco, such as cigarettes, e-cigarettes, and chewing tobacco. If you need help quitting, ask your health care provider.  If you are sexually active, practice safe sex. Use a condom or other form of protection in order to prevent STIs (sexually transmitted infections).  Talk with your health care provider about taking a low-dose aspirin or statin. What's next?  Go to your health care provider once a year for a well check visit.  Ask your health care provider how often you should have your eyes and teeth checked.  Stay up to date on all vaccines. This information is not intended to replace advice given to you by your health care provider. Make sure you discuss any questions you have with your health care  provider. Document Revised: 07/26/2018 Document Reviewed: 07/26/2018 Elsevier Patient Education  2020 [employer].

## 2019-10-27 ENCOUNTER — Encounter: Payer: Self-pay | Admitting: Family Medicine

## 2019-10-31 LAB — IGP, APTIMA HPV: HPV Aptima: NEGATIVE

## 2019-11-20 ENCOUNTER — Other Ambulatory Visit: Payer: Self-pay | Admitting: Gastroenterology

## 2019-11-25 ENCOUNTER — Ambulatory Visit (INDEPENDENT_AMBULATORY_CARE_PROVIDER_SITE_OTHER): Payer: PPO | Admitting: Gastroenterology

## 2019-11-25 ENCOUNTER — Encounter: Payer: Self-pay | Admitting: Gastroenterology

## 2019-11-25 ENCOUNTER — Other Ambulatory Visit (INDEPENDENT_AMBULATORY_CARE_PROVIDER_SITE_OTHER): Payer: PPO

## 2019-11-25 VITALS — BP 134/62 | HR 77 | Temp 98.7°F | Ht 62.0 in | Wt 218.0 lb

## 2019-11-25 DIAGNOSIS — K7290 Hepatic failure, unspecified without coma: Secondary | ICD-10-CM | POA: Diagnosis not present

## 2019-11-25 DIAGNOSIS — K7460 Unspecified cirrhosis of liver: Secondary | ICD-10-CM | POA: Diagnosis not present

## 2019-11-25 DIAGNOSIS — K7682 Hepatic encephalopathy: Secondary | ICD-10-CM

## 2019-11-25 DIAGNOSIS — K7581 Nonalcoholic steatohepatitis (NASH): Secondary | ICD-10-CM | POA: Diagnosis not present

## 2019-11-25 LAB — COMPREHENSIVE METABOLIC PANEL
ALT: 33 U/L (ref 0–35)
AST: 51 U/L — ABNORMAL HIGH (ref 0–37)
Albumin: 3.5 g/dL (ref 3.5–5.2)
Alkaline Phosphatase: 95 U/L (ref 39–117)
BUN: 22 mg/dL (ref 6–23)
CO2: 31 mEq/L (ref 19–32)
Calcium: 9.3 mg/dL (ref 8.4–10.5)
Chloride: 103 mEq/L (ref 96–112)
Creatinine, Ser: 1.04 mg/dL (ref 0.40–1.20)
GFR: 52.14 mL/min — ABNORMAL LOW (ref >60.00)
Glucose, Bld: 123 mg/dL — ABNORMAL HIGH (ref 70–99)
Potassium: 4.2 mEq/L (ref 3.5–5.1)
Sodium: 142 mEq/L (ref 135–145)
Total Bilirubin: 2.9 mg/dL — ABNORMAL HIGH (ref 0.2–1.2)
Total Protein: 6 g/dL (ref 6.0–8.3)

## 2019-11-25 LAB — CBC WITH DIFFERENTIAL/PLATELET
Basophils Absolute: 0 10*3/uL (ref 0.0–0.1)
Basophils Relative: 0.6 % (ref 0.0–3.0)
Eosinophils Absolute: 0.2 10*3/uL (ref 0.0–0.7)
Eosinophils Relative: 2.9 % (ref 0.0–5.0)
HCT: 39.4 % (ref 36.0–46.0)
Hemoglobin: 13.5 g/dL (ref 12.0–15.0)
Lymphocytes Relative: 43 % (ref 12.0–46.0)
Lymphs Abs: 2.5 10*3/uL (ref 0.7–4.0)
MCHC: 34.2 g/dL (ref 30.0–36.0)
MCV: 94.6 fl (ref 78.0–100.0)
Monocytes Absolute: 0.6 10*3/uL (ref 0.1–1.0)
Monocytes Relative: 9.7 % (ref 3.0–12.0)
Neutro Abs: 2.6 10*3/uL (ref 1.4–7.7)
Neutrophils Relative %: 43.8 % (ref 43.0–77.0)
Platelets: 96 10*3/uL — ABNORMAL LOW (ref 150.0–400.0)
RBC: 4.17 Mil/uL (ref 3.87–5.11)
RDW: 14.6 % (ref 11.5–15.5)
WBC: 5.9 10*3/uL (ref 4.0–10.5)

## 2019-11-25 LAB — PROTIME-INR
INR: 1.3 ratio — ABNORMAL HIGH (ref 0.8–1.0)
Prothrombin Time: 14 s — ABNORMAL HIGH (ref 9.6–13.1)

## 2019-11-25 NOTE — Patient Instructions (Signed)
Your provider has requested that you go to the basement level for lab work before leaving today. Press "B" on the elevator. The lab is located at the first door on the left as you exit the elevator.  Continue Xifaxan.   If you are age 72 or older, your body mass index should be between 23-30. Your Body mass index is 39.87 kg/m. If this is out of the aforementioned range listed, please consider follow up with your Primary Care Provider.  If you are age 81 or younger, your body mass index should be between 19-25. Your Body mass index is 39.87 kg/m. If this is out of the aformentioned range listed, please consider follow up with your Primary Care Provider.    See low carb diet  We will see you back in the office in 3 months.   Thank you for choosing me and [facility].  Dr. Silverio Decamp

## 2019-11-25 NOTE — Progress Notes (Signed)
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  History of COVID-19 infection November 2020.  Worsening mental status with confusion and somnolence post Covid.  She has prominent splenorenal shunt, was referred to IR to discuss possible embolization.  But given she has significant improvement of symptoms with Xifaxan twice daily, family and patient preferred to hold off the embolization.  She continues to have intermittent episodes of confusion, sleeps during daytime and sometimes has difficulty sleeping at night per her daughter.  She is taking naproxen twice daily and lactulose up to 3 times daily.    Denies any melena or rectal bleeding.  She has decreased appetite, lack of taste and intermittent nausea.   No vomiting, abdominal bloating or weight gain.  Intermittent lower extremity edema and leg pain and cramping   Outpatient Encounter Medications as of 11/25/2019  Medication Sig  . ALPRAZolam (XANAX) 0.25 MG tablet Take 1 tablet (0.25 mg total) by mouth 2 (two) times daily as needed for anxiety.  Marland Kitchen atorvastatin (LIPITOR) 40 MG tablet Take 1 tablet by mouth once daily  . benazepril (LOTENSIN) 40 MG tablet Take 1 tablet by mouth once daily  . blood glucose meter kit and supplies KIT Dispense based on patient and insurance preference. Use up to four times daily as directed. (FOR ICD-9 250.00, 250.01).  . Blood Glucose Monitoring Suppl (LIBERTY BLOOD GLUCOSE MONITOR) DEVI 1 each by Does not apply route daily. Test 1x per day and prn-dx250.02  . Cholecalciferol (VITAMIN D3) 5000 units CAPS Take 5,000 Units by mouth daily.  . cyclobenzaprine (FLEXERIL) 5 MG tablet TAKE 1 TABLET BY  MOUTH THREE TIMES DAILY AS NEEDED FOR MUSCLE SPASMS (NEEDS APPOINTMENT)  . diltiazem (DILTIAZEM CD) 120 MG 24 hr capsule Take 1 capsule (120 mg total) by mouth daily.  . furosemide (LASIX) 40 MG tablet TAKE 1 TABLET BY MOUTH IN THE MORNING & 1/2 (ONE-HALF)TABLET AT 1 IN THE AFTERNOON  . glucose blood test strip Test 1x per day and prn- dx 250.02  . lactulose (CHRONULAC) 10 GM/15ML solution TAKE 15 ML BY MOUTH  THREE TIMES DAILY  . meclizine (ANTIVERT) 25 MG tablet Take 1 tablet (25 mg total) by mouth 3 (three) times daily as needed for dizziness.  . metFORMIN (GLUCOPHAGE) 500 MG tablet Take 1 tablet (500 mg total) by mouth 2 (two) times daily with a meal. (Patient taking differently: Take 500 mg by mouth daily. )  . omeprazole (PRILOSEC) 40 MG capsule Take 1 capsule by mouth once daily  . ondansetron (ZOFRAN) 4 MG tablet Take 1 tablet (4 mg total) by mouth every 8 (eight) hours as needed for nausea or vomiting.  . ONE TOUCH ULTRA TEST test strip USE ONE STRIP TO CHECK GLUCOSE ONCE DAILY  . ONETOUCH DELICA LANCETS 94T MISC USE ONE LANCET TO CHECK GLUCOSE ONCE DAILY AND AS NEEDED  . rifaximin (XIFAXAN) 550 MG TABS tablet Take 1 tablet (550 mg total) by mouth 2 (two) times daily.  . traMADol (ULTRAM) 50 MG tablet Take 1 tablet (50 mg total) by mouth every 12 (twelve) hours as needed for moderate pain.   No facility-administered  encounter medications on file as of 11/25/2019.    Allergies as of 11/25/2019 - Review Complete 10/27/2019  Allergen Reaction Noted  . Latex  03/27/2018    Past Medical History:  Diagnosis Date  . Allergy    seasonal  . Anxiety   . Arthritis    hands, back   . Cataract    surgery to remove  . Chronic back pain    lower back   . Cirrhosis of liver without ascites ([place])    recent dx 07/11/18  . COPD (chronic obstructive pulmonary disease) (HCC)    no inhaler  . Diabetes mellitus without complication ([place])   . DJD (degenerative joint disease)   . Fibromyalgia    . Genetic testing 03/08/2017   [name] underwent genetic counseling and testing for hereditary cancer syndromes on 02/21/2017. Her results were negative for mutations in all 83 genes analyzed by [facility]'s 83-gene Multi-Cancers Panel. Genes analyzed include: ALK, APC, ATM, AXIN2, BAP1, BARD1, BLM, BMPR1A, BRCA1, BRCA2, BRIP1, CASR, CDC73, CDH1, CDK4, CDKN1B, CDKN1C, CDKN2A, CEBPA, CHEK2, CTNNA1, DICER1, DIS3L2, EGFR, EPCAM  . GERD (gastroesophageal reflux disease)   . 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 History of abnormal mammogram   . Hyperlipidemia   . Hypertension   . Irregular heart beat   . Neuromuscular disorder (HCC)    fibromyalgia  . Sleep apnea    cpap some  . SVD (spontaneous vaginal delivery)    x 2  . Tubular adenoma of colon 05/09/12   "innumerable number" of flat polyps    Past Surgical History:  Procedure Laterality Date  . CARPAL TUNNEL RELEASE Left 10/20/14   [facility]  . CATARACT EXTRACTION, BILATERAL    . [facility]  . CHOLECYSTECTOMY  1990  . COLONOSCOPY    . IR RADIOLOGIST EVAL & MGMT  07/16/2019  . KNEE ARTHROSCOPY WITH SUBCHONDROPLASTY Right 03/27/2018   Procedure: Right knee arthroscopic partial medial meniscus repair versus menisectomy with medial tibia subchondroplasty;  Surgeon: Nicholes Stairs, MD;  Location: [facility];  Service: Orthopedics;  Laterality: Right;  120 mins  . LARYNX SURGERY     POLYP REMOVAL  . MULTIPLE TOOTH EXTRACTIONS    . POLYPECTOMY     vocal cord  . TONSILLECTOMY  1965  . TUBAL LIGATION    . TUMOR REMOVAL     benign fatty tumors on abdomen  . UPPER GASTROINTESTINAL ENDOSCOPY      Family History  Problem Relation Age of Onset  . Prostate cancer Father        d.89 diagnosed in late-60s  . Colon cancer Father        diagnosed at 62  . Heart failure Mother        d.37  . Heart disease Mother   . Colon polyps Brother        polyposis. Underwent partial colectomy at age 62.  Marland Kitchen Spina bifida  Brother        died at birth  . Stomach cancer Paternal Aunt        questionable  . Kidney cancer Cousin        d.78s maternal first-cousin  . Other Other 16       multiple lipomas  . Colon cancer Paternal Uncle        in his 40s  . Heart failure Maternal Grandmother        d.45  . Heart failure Maternal Uncle  d.50  . Colon polyps Son 2  . Heart attack Son 76  . Leukemia Maternal Aunt   . Esophageal cancer Neg Hx   . Rectal cancer Neg Hx     Social History   Socioeconomic History  . Marital status: Married    Spouse name: [name]  . Number of children: 3  . Years of education: 82  . Highest education level: High school graduate  Occupational History  . Occupation: retired  Tobacco Use  . Smoking status: Former Smoker    Packs/day: 1.00    Years: 3.00    Pack years: 3.00    Types: Cigarettes    Quit date: 08/15/1969    Years since quitting: 50.3  . Smokeless tobacco: Never Used  Substance and Sexual Activity  . Alcohol use: No    Alcohol/week: 0.0 standard drinks  . Drug use: No  . Sexual activity: Not Currently  Other Topics Concern  . Not on file  Social History Narrative  . Not on file   Social Determinants of Health   Financial Resource Strain: Low Risk   . Difficulty of Paying Living Expenses: Not hard at all  Food Insecurity: No Food Insecurity  . Worried About Charity fundraiser in the Last Year: Never true  . Ran Out of Food in the Last Year: Never true  Transportation Needs: No Transportation Needs  . Lack of Transportation (Medical): No  . Lack of Transportation (Non-Medical): No  Physical Activity: Inactive  . Days of Exercise per Week: 0 days  . Minutes of Exercise per Session: 0 min  Stress: No Stress Concern Present  . Feeling of Stress : Not at all  Social Connections: Not Isolated  . Frequency of Communication with Friends and Family: More than three times a week  . Frequency of Social Gatherings with Friends and Family: More  than three times a week  . Attends Religious Services: More than 4 times per year  . Active Member of Clubs or Organizations: Yes  . Attends Archivist Meetings: More than 4 times per year  . Marital Status: Married  Human resources officer Violence: Not At Risk  . Fear of Current or Ex-Partner: No  . Emotionally Abused: No  . Physically Abused: No  . Sexually Abused: No      Review of systems:  All other review of systems negative except as mentioned in the HPI.   Physical Exam: Vitals:   11/25/19 1328  BP: 134/62  Pulse: 77  Temp: 98.7 F (37.1 C)   Body mass index is 39.87 kg/m. Gen:      No acute distress Abd:      soft, non-tender; no palpable masses, no distension Ext:    No edema; adequate peripheral perfusion Neuro: alert and oriented x 3, no asterixis Psych: normal mood and affect  Data Reviewed:  Reviewed labs, radiology imaging, old records and pertinent past GI work up   Assessment and Plan/Recommendations:  74 year old female with obesity, hyperlipidemia, type 2 diabetes and nonalcoholic steatohepatitis/decompensated cirrhosis  Meld score 12  Check CBC, CMP and PT/INR  Hepatic encephalopathy: Stable on Xifaxan twice daily and lactulose 3 times daily as needed 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  Repeat EGD for surveillance in 2023  Continue low-carb diet, small meals as tolerated  Return in 3 months or sooner if needed  This visit required 45 minutes  of patient care (this includes precharting, chart review, review of results, face-to-face time used for counseling as well as treatment plan and follow-up. The patient was provided an opportunity to ask questions and all were answered. The patient agreed with the plan and demonstrated an understanding of the instructions.  Damaris Hippo , MD    CC: Stacey Norlander, DO

## 2019-11-30 ENCOUNTER — Other Ambulatory Visit: Payer: Self-pay | Admitting: Gastroenterology

## 2019-12-02 ENCOUNTER — Other Ambulatory Visit: Payer: Self-pay | Admitting: Gastroenterology

## 2019-12-04 ENCOUNTER — Other Ambulatory Visit: Payer: Self-pay | Admitting: Family Medicine

## 2019-12-04 ENCOUNTER — Other Ambulatory Visit: Payer: Self-pay | Admitting: Nurse Practitioner

## 2019-12-04 DIAGNOSIS — E1159 Type 2 diabetes mellitus with other circulatory complications: Secondary | ICD-10-CM

## 2019-12-04 DIAGNOSIS — I152 Hypertension secondary to endocrine disorders: Secondary | ICD-10-CM

## 2019-12-04 DIAGNOSIS — K219 Gastro-esophageal reflux disease without esophagitis: Secondary | ICD-10-CM

## 2019-12-05 ENCOUNTER — Telehealth: Payer: Self-pay | Admitting: Family Medicine

## 2019-12-05 NOTE — Telephone Encounter (Signed)
Called patient, no answer, left voicemail to return call.

## 2019-12-05 NOTE — Telephone Encounter (Signed)
Appointment scheduled.

## 2019-12-06 ENCOUNTER — Telehealth (INDEPENDENT_AMBULATORY_CARE_PROVIDER_SITE_OTHER): Payer: PPO | Admitting: Family Medicine

## 2019-12-06 DIAGNOSIS — J301 Allergic rhinitis due to pollen: Secondary | ICD-10-CM | POA: Diagnosis not present

## 2019-12-06 MED ORDER — FLUTICASONE PROPIONATE 50 MCG/ACT NA SUSP: 1.0000 | Freq: Two times a day (BID) | NASAL | 6 refills | Status: DC

## 2019-12-06 NOTE — Patient Instructions (Signed)
Allergic Rhinitis, Adult Allergic rhinitis is a reaction to allergens in the air. Allergens are tiny specks (particles) in the air that cause your body to have an allergic reaction. This condition cannot be passed from person to person (is not contagious). Allergic rhinitis cannot be cured, but it can be controlled. There are two types of allergic rhinitis:  Seasonal. This type is also called hay fever. It happens only during certain times of the year.  Perennial. This type can happen at any time of the year. What are the causes? This condition may be caused by:  Pollen from grasses, trees, and weeds.  House dust mites.  Pet dander.  Mold. What are the signs or symptoms? Symptoms of this condition include:  Sneezing.  Runny or stuffy nose (nasal congestion).  A lot of mucus in the back of the throat (postnasal drip).  Itchy nose.  Tearing of the eyes.  Trouble sleeping.  Being sleepy during day. How is this treated? There is no cure for this condition. You should avoid things that trigger your symptoms (allergens). Treatment can help to relieve symptoms. This may include:  Medicines that block allergy symptoms, such as antihistamines. These may be given as a shot, nasal spray, or pill.  Shots that are given until your body becomes less sensitive to the allergen (desensitization).  Stronger medicines, if all other treatments have not worked. Follow these instructions at home: Avoiding allergens   Find out what you are allergic to. Common allergens include smoke, dust, and pollen.  Avoid them if you can. These are some of the things that you can do to avoid allergens: ? Replace carpet with wood, tile, or vinyl flooring. Carpet can trap dander and dust. ? Clean any mold found in the home. ? Do not smoke. Do not allow smoking in your home. ? Change your heating and air conditioning filter at least once a month. ? During allergy season:  Keep windows closed as much as  you can. If possible, use air conditioning when there is a lot of pollen in the air.  Use a special filter for allergies with your furnace and air conditioner.  Plan outdoor activities when pollen counts are lowest. This is usually during the early morning or evening hours.  If you do go outdoors when pollen count is high, wear a special mask for people with allergies.  When you come indoors, take a shower and change your clothes before sitting on furniture or bedding. General instructions  Do not use fans in your home.  Do not hang clothes outside to dry.  Wear sunglasses to keep pollen out of your eyes.  Wash your hands right away after you touch household pets.  Take over-the-counter and prescription medicines only as told by your doctor.  Keep all follow-up visits as told by your doctor. This is important. Contact a doctor if:  You have a fever.  You have a cough that does not go away (is persistent).  You start to make whistling sounds when you breathe (wheeze).  Your symptoms do not get better with treatment.  You have thick fluid coming from your nose.  You start to have nosebleeds. Get help right away if:  Your tongue or your lips are swollen.  You have trouble breathing.  You feel dizzy or you feel like you are going to pass out (faint).  You have cold sweats. Summary  Allergic rhinitis is a reaction to allergens in the air.  This condition may be  caused by allergens. These include pollen, dust mites, pet dander, and mold.  Symptoms include a runny, itchy nose, sneezing, or tearing eyes. You may also have trouble sleeping or feel sleepy during the day.  Treatment includes taking medicines and avoiding allergens. You may also get shots or take stronger medicines.  Get help if you have a fever or a cough that does not stop. Get help right away if you are short of breath. This information is not intended to replace advice given to you by your health care  provider. Make sure you discuss any questions you have with your health care provider. Document Revised: 11/20/2018 Document Reviewed: 02/20/2018 Elsevier Patient Education  [employer].

## 2019-12-06 NOTE — Progress Notes (Signed)
Telephone visit  Subjective: CC: allergies PCP: Stacey Norlander, DO JSR:PRXYV Stacey Summers is a 74 y.o. female calls for telephone consult today. Patient provides verbal consent for consult held via phone.  Due to COVID-19 pandemic this visit was conducted virtually. This visit type was conducted due to national recommendations for restrictions regarding the COVID-19 Pandemic (e.g. social distancing, sheltering in place) in an effort to limit this patient's exposure and mitigate transmission in our community. All issues noted in this document were discussed and addressed.  A physical exam was not performed with this format.   Location of patient: home Location of provider: [facility] Others present for call: none  1. Allergies Patient reports that she has been having ear pain and facial/sinus pressure that has been ongoing for a few days now. She reports nasal congestion.  She has had some rhinorrhea as well.  Denies any cough, chest congestion, fevers or shortness of breath.  She is not using anything right now because she has liver disease and was not sure what to pick.   ROS: Per HPI  Allergies  Allergen Reactions  . Latex     "my hands feels numb"   Past Medical History:  Diagnosis Date  . Allergy    seasonal  . Anxiety   . Arthritis    hands, back   . Cataract    surgery to remove  . Chronic back pain    lower back   . Cirrhosis of liver without ascites ([place])    recent dx 07/11/18  . COPD (chronic obstructive pulmonary disease) (HCC)    no inhaler  . Diabetes mellitus without complication ([place])   . DJD (degenerative joint disease)   . Fibromyalgia   . Genetic testing 03/08/2017   Stacey Summers underwent genetic counseling and testing for hereditary cancer syndromes on 02/21/2017. Her results were negative for mutations in all 83 genes analyzed by [facility]'s 83-gene Multi-Cancers Panel. Genes analyzed include: ALK, APC, ATM, AXIN2, BAP1, BARD1, BLM, BMPR1A, BRCA1, BRCA2,  BRIP1, CASR, CDC73, CDH1, CDK4, CDKN1B, CDKN1C, CDKN2A, CEBPA, CHEK2, CTNNA1, DICER1, DIS3L2, EGFR, EPCAM  . GERD (gastroesophageal reflux disease)   . 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 History of abnormal mammogram   . Hyperlipidemia   . Hypertension   . Irregular heart beat   . Neuromuscular disorder (HCC)    fibromyalgia  . Sleep apnea    cpap some  . SVD (spontaneous vaginal delivery)    x 2  . Tubular adenoma of colon 05/09/12   "innumerable number" of flat polyps    Current Outpatient Medications:  .  ALPRAZolam (XANAX) 0.25 MG tablet, Take 1 tablet (0.25 mg total) by mouth 2 (two) times daily as needed for anxiety., Disp: 30 tablet, Rfl: 1 .  atorvastatin (LIPITOR) 40 MG tablet, Take 1 tablet by mouth once daily, Disp: 90 tablet, Rfl: 0 .  benazepril (LOTENSIN) 40 MG tablet, Take 1 tablet by mouth once daily, Disp: 90 tablet, Rfl: 0 .  blood glucose meter kit and supplies KIT, Dispense based on patient and insurance preference. Use up to four times daily as directed. (FOR ICD-9 250.00, 250.01)., Disp: 1 each, Rfl: 10 .  Blood Glucose Monitoring Suppl (LIBERTY BLOOD GLUCOSE MONITOR) DEVI, 1 each by Does not apply route daily. Test 1x per day and prn-dx250.02, Disp: 1 each, Rfl: 0 .  Cholecalciferol (VITAMIN D3) 5000 units CAPS, Take 5,000 Units by mouth daily., Disp: , Rfl:  .  cyclobenzaprine (  FLEXERIL) 5 MG tablet, TAKE 1 TABLET BY MOUTH THREE TIMES DAILY AS NEEDED FOR MUSCLE SPASMS (NEEDS APPOINTMENT), Disp: 30 tablet, Rfl: 0 .  diltiazem (DILTIAZEM CD) 120 MG 24 hr capsule, Take 1 capsule (120 mg total) by mouth daily., Disp: 90 capsule, Rfl: 1 .  furosemide (LASIX) 40 MG tablet, TAKE 1 TABLET BY MOUTH IN THE MORNING & 1/2 (ONE-HALF)TABLET AT 1 IN THE AFTERNOON, Disp: 135 tablet, Rfl: 0 .  glucose blood test strip, Test 1x per day and prn- dx 250.02, Disp: 100 each, Rfl: 2 .  lactulose (CHRONULAC) 10 GM/15ML solution, TAKE 15 ML BY MOUTH  THREE TIMES DAILY, Disp:  473 mL, Rfl: 1 .  meclizine (ANTIVERT) 25 MG tablet, Take 1 tablet (25 mg total) by mouth 3 (three) times daily as needed for dizziness., Disp: 30 tablet, Rfl: 0 .  metFORMIN (GLUCOPHAGE) 500 MG tablet, Take 1 tablet (500 mg total) by mouth 2 (two) times daily with a meal. (Patient taking differently: Take 500 mg by mouth daily. ), Disp: 180 tablet, Rfl: 1 .  omeprazole (PRILOSEC) 40 MG capsule, Take 1 capsule by mouth once daily, Disp: 90 capsule, Rfl: 0 .  ondansetron (ZOFRAN) 4 MG tablet, Take 1 tablet (4 mg total) by mouth every 8 (eight) hours as needed for nausea or vomiting., Disp: 30 tablet, Rfl: 1 .  ONE TOUCH ULTRA TEST test strip, USE ONE STRIP TO CHECK GLUCOSE ONCE DAILY, Disp: 100 each, Rfl: 1 .  ONETOUCH DELICA LANCETS 18H MISC, USE ONE LANCET TO CHECK GLUCOSE ONCE DAILY AND AS NEEDED, Disp: 100 each, Rfl: 0 .  rifaximin (XIFAXAN) 550 MG TABS tablet, Take 1 tablet (550 mg total) by mouth 2 (two) times daily., Disp: 60 tablet, Rfl: 3 .  traMADol (ULTRAM) 50 MG tablet, Take 1 tablet (50 mg total) by mouth every 12 (twelve) hours as needed for moderate pain., Disp: 30 tablet, Rfl: 1  Assessment/ Plan: 74 y.o. female    1. Seasonal allergic rhinitis due to pollen Given liver disease will just use a topical.  Start Flonase 1 spray in each nostril twice daily.  Nasal saline rinses would also be beneficial.  Home care instructions were reinforced.  Reasons for reevaluation discussed.  She voiced good understanding of follow-up as needed - fluticasone (FLONASE) 50 MCG/ACT nasal spray; Place 1 spray into both nostrils in the morning and at bedtime.  Dispense: 16 g; Refill: 6   Start time: 1:21pm End time: 1:25pm  Total time spent on patient care (including telephone call/ virtual visit): 10 minutes  [place], [place] [phone]

## 2019-12-25 ENCOUNTER — Encounter: Payer: Self-pay | Admitting: Cardiovascular Disease

## 2019-12-25 ENCOUNTER — Telehealth: Payer: Self-pay | Admitting: *Deleted

## 2019-12-25 ENCOUNTER — Telehealth (INDEPENDENT_AMBULATORY_CARE_PROVIDER_SITE_OTHER): Payer: PPO | Admitting: Cardiovascular Disease

## 2019-12-25 VITALS — Ht 62.0 in

## 2019-12-25 DIAGNOSIS — G4733 Obstructive sleep apnea (adult) (pediatric): Secondary | ICD-10-CM

## 2019-12-25 DIAGNOSIS — I10 Essential (primary) hypertension: Secondary | ICD-10-CM

## 2019-12-25 DIAGNOSIS — R002 Palpitations: Secondary | ICD-10-CM | POA: Diagnosis not present

## 2019-12-25 DIAGNOSIS — E785 Hyperlipidemia, unspecified: Secondary | ICD-10-CM

## 2019-12-25 DIAGNOSIS — K7581 Nonalcoholic steatohepatitis (NASH): Secondary | ICD-10-CM

## 2019-12-25 DIAGNOSIS — K7460 Unspecified cirrhosis of liver: Secondary | ICD-10-CM

## 2019-12-25 NOTE — Patient Instructions (Signed)

## 2019-12-25 NOTE — Telephone Encounter (Signed)
  Patient Consent for Virtual Visit         Stacey Summers has provided verbal consent on 12/25/2019 for a virtual visit (video or telephone).   CONSENT FOR VIRTUAL VISIT FOR:  Stacey Summers  By participating in this virtual visit I agree to the following:  I hereby voluntarily request, consent and authorize [facility] and its employed or contracted physicians, physician assistants, nurse practitioners or other licensed health care professionals (the Practitioner), to provide me with telemedicine health care services (the "Services") as deemed necessary by the treating Practitioner. I acknowledge and consent to receive the Services by the Practitioner via telemedicine. I understand that the telemedicine visit will involve communicating with the Practitioner through live audiovisual communication technology and the disclosure of certain medical information by electronic transmission. I acknowledge that I have been given the opportunity to request an in-person assessment or other available alternative prior to the telemedicine visit and am voluntarily participating in the telemedicine visit.  I understand that I have the right to withhold or withdraw my consent to the use of telemedicine in the course of my care at any time, without affecting my right to future care or treatment, and that the Practitioner or I may terminate the telemedicine visit at any time. I understand that I have the right to inspect all information obtained and/or recorded in the course of the telemedicine visit and may receive copies of available information for a reasonable fee.  I understand that some of the potential risks of receiving the Services via telemedicine include:  Marland Kitchen Delay or interruption in medical evaluation due to technological equipment failure or disruption; . Information transmitted may not be sufficient (e.g. poor resolution of images) to allow for appropriate medical decision making by the  Practitioner; and/or  . In rare instances, security protocols could fail, causing a breach of personal health information.  Furthermore, I acknowledge that it is my responsibility to provide information about my medical history, conditions and care that is complete and accurate to the best of my ability. I acknowledge that Practitioner's advice, recommendations, and/or decision may be based on factors not within their control, such as incomplete or inaccurate data provided by me or distortions of diagnostic images or specimens that may result from electronic transmissions. I understand that the practice of medicine is not an exact science and that Practitioner makes no warranties or guarantees regarding treatment outcomes. I acknowledge that a copy of this consent can be made available to me via my patient portal ([facility]), or I can request a printed copy by calling the office of [facility].    I understand that my insurance will be billed for this visit.   I have read or had this consent read to me. . I understand the contents of this consent, which adequately explains the benefits and risks of the Services being provided via telemedicine.  . I have been provided ample opportunity to ask questions regarding this consent and the Services and have had my questions answered to my satisfaction. . I give my informed consent for the services to be provided through the use of telemedicine in my medical care

## 2019-12-25 NOTE — Progress Notes (Signed)
Virtual Visit via Telephone Note   This visit type was conducted due to national recommendations for restrictions regarding the COVID-19 Pandemic (e.g. social distancing) in an effort to limit this patient's exposure and mitigate transmission in our community.  Due to her co-morbid illnesses, this patient is at least at moderate risk for complications without adequate follow up.  This format is felt to be most appropriate for this patient at this time.  The patient did not have access to video technology/had technical difficulties with video requiring transitioning to audio format only (telephone).  All issues noted in this document were discussed and addressed.  No physical exam could be performed with this format.  Please refer to the patient's chart for her  consent to telehealth for [facility].   Date:  12/26/2019   ID:  Stacey Summers, DOB 1945-11-04, MRN [id number]  Patient Location: Home Provider Location: Home  PCP:  Janora Norlander, DO  Cardiologist:  Sanda Klein, MD  Electrophysiologist:  None   Evaluation Performed:  Follow-Up Visit  Chief Complaint: Palpitations, obstructive sleep apnea  History of Present Illness:    Stacey Summers is a 74 y.o. female with Morbid obesity, obstructive sleep apnea, essential hypertension, hyperlipidemia and chronic leg edema.  Her palpitations have improved after we started diltiazem rather than amlodipine.  She has relatively mild and only intermittent ankle swelling.  The palpitations are generally never sustained.  She denies chest pain or shortness of breath, but her activity is severely limited by back pain and bilateral knee pain.  She can only stand up for about 5 minutes at a time before developing severe back pain.  Compliance with her CPAP is inconsistent, primarily because she has problems with insomnia.  She has not had dizziness or syncope and denies falls or serious injuries.  Her platelet count remains mildly  decreased, without overt bleeding issues.  Thrombocytopenia has discouraged plans for back surgery.  She is concerned about possible "blood clots" although I am not sure why this is specifically her concern.  Her mother died suddenly without clear explanation by postmortem study at age 2.  Her brother, who had multiple health problems diabetes sleep at age 33 not long ago.  She thinks maybe they had clots.  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  The patient does not have symptoms concerning for COVID-19 infection (fever, chills, cough, or new shortness of breath).    Past Medical History:  Diagnosis Date  . Allergy    seasonal  . Anxiety   . Arthritis    hands, back   . Cataract    surgery to remove  . Chronic back pain    lower back   . Cirrhosis of liver without ascites ([place])    recent dx 07/11/18  . COPD (chronic obstructive pulmonary disease) (HCC)    no inhaler  . Diabetes mellitus without complication ([place])   . DJD (degenerative joint disease)   . Fibromyalgia   . Genetic testing 03/08/2017   Ms. Dolbow underwent genetic counseling and testing for hereditary cancer syndromes on 02/21/2017. Her results were negative for mutations in all 83 genes analyzed by [facility]'s 83-gene Multi-Cancers Panel. Genes analyzed include: ALK, APC, ATM, AXIN2, BAP1, BARD1, BLM, BMPR1A, BRCA1, BRCA2, BRIP1, CASR, CDC73, CDH1, CDK4, CDKN1B, CDKN1C, CDKN2A, CEBPA, CHEK2, CTNNA1, DICER1, DIS3L2, EGFR, EPCAM  . GERD (gastroesophageal reflux disease)   . 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 History of abnormal  mammogram   . Hyperlipidemia   . Hypertension   . Irregular heart beat   . Neuromuscular disorder (HCC)    fibromyalgia  . Sleep apnea    cpap some  . SVD (spontaneous vaginal delivery)    x 2  . Tubular adenoma of colon 05/09/12   "innumerable number" of flat polyps   Past Surgical History:  Procedure Laterality Date  . CARPAL TUNNEL RELEASE Left  10/20/14   [facility]  . CATARACT EXTRACTION, BILATERAL    . [place]  . CHOLECYSTECTOMY  1990  . COLONOSCOPY    . IR RADIOLOGIST EVAL & MGMT  07/16/2019  . KNEE ARTHROSCOPY WITH SUBCHONDROPLASTY Right 03/27/2018   Procedure: Right knee arthroscopic partial medial meniscus repair versus menisectomy with medial tibia subchondroplasty;  Surgeon: Nicholes Stairs, MD;  Location: [facility];  Service: Orthopedics;  Laterality: Right;  120 mins  . LARYNX SURGERY     POLYP REMOVAL  . MULTIPLE TOOTH EXTRACTIONS    . POLYPECTOMY     vocal cord  . TONSILLECTOMY  1965  . TUBAL LIGATION    . TUMOR REMOVAL     benign fatty tumors on abdomen  . UPPER GASTROINTESTINAL ENDOSCOPY       Current Meds  Medication Sig  . ALPRAZolam (XANAX) 0.25 MG tablet Take 1 tablet (0.25 mg total) by mouth 2 (two) times daily as needed for anxiety.  Marland Kitchen atorvastatin (LIPITOR) 40 MG tablet Take 1 tablet by mouth once daily  . benazepril (LOTENSIN) 40 MG tablet Take 1 tablet by mouth once daily  . Cholecalciferol (VITAMIN D3) 5000 units CAPS Take 5,000 Units by mouth daily.  . cyclobenzaprine (FLEXERIL) 5 MG tablet TAKE 1 TABLET BY MOUTH THREE TIMES DAILY AS NEEDED FOR MUSCLE SPASMS (NEEDS APPOINTMENT)  . diltiazem (DILTIAZEM CD) 120 MG 24 hr capsule Take 1 capsule (120 mg total) by mouth daily.  . fluticasone (FLONASE) 50 MCG/ACT nasal spray Place 1 spray into both nostrils in the morning and at bedtime.  . furosemide (LASIX) 40 MG tablet TAKE 1 TABLET BY MOUTH IN THE MORNING & 1/2 (ONE-HALF)TABLET AT 1 IN THE AFTERNOON  . lactulose (CHRONULAC) 10 GM/15ML solution TAKE 15 ML BY MOUTH  THREE TIMES DAILY  . meclizine (ANTIVERT) 25 MG tablet Take 1 tablet (25 mg total) by mouth 3 (three) times daily as needed for dizziness.  . metFORMIN (GLUCOPHAGE) 500 MG tablet Take 1 tablet (500 mg total) by mouth 2 (two) times daily with a meal. (Patient taking differently: Take 500 mg by mouth daily. )  . omeprazole  (PRILOSEC) 40 MG capsule Take 1 capsule by mouth once daily  . ondansetron (ZOFRAN) 4 MG tablet Take 1 tablet (4 mg total) by mouth every 8 (eight) hours as needed for nausea or vomiting.  . rifaximin (XIFAXAN) 550 MG TABS tablet Take 1 tablet (550 mg total) by mouth 2 (two) times daily.  . traMADol (ULTRAM) 50 MG tablet Take 1 tablet (50 mg total) by mouth every 12 (twelve) hours as needed for moderate pain.     Allergies:   Latex   Social History   Tobacco Use  . Smoking status: Former Smoker    Packs/day: 1.00    Years: 3.00    Pack years: 3.00    Types: Cigarettes    Quit date: 08/15/1969    Years since quitting: 50.3  . Smokeless tobacco: Never Used  Substance Use Topics  . Alcohol use: No    Alcohol/week:  0.0 standard drinks  . Drug use: No     Family Hx: The patient's family history includes Colon cancer in her father and paternal uncle; Colon polyps in her brother; Colon polyps (age of onset: 76) in her son; Heart attack (age of onset: 1) in her son; Heart disease in her mother; Heart failure in her maternal grandmother, maternal uncle, and mother; Kidney cancer in her cousin; Leukemia in her maternal aunt; Other (age of onset: 10) in an other family member; Prostate cancer in her father; Spina bifida in her brother; Stomach cancer in her paternal aunt. There is no history of Esophageal cancer or Rectal cancer.  ROS:   Please see the history of present illness.    All other systems are reviewed and are negative. Prior CV studies:   The following studies were reviewed today:  Echocardiogram from 2014 was a normal study other than mild left atrial enlargement.  EF was 55-60%.  Labs/Other Tests and Data Reviewed:    EKG:  An ECG dated 09/18/2019 was personally reviewed today and demonstrated:  Sinus rhythm with PVCs, otherwise normal tracing  Recent Labs: 01/01/2019: TSH 4.090 11/25/2019: ALT 33; BUN 22; Creatinine, Ser 1.04; Hemoglobin 13.5; Platelets 96.0; Potassium 4.2;  Sodium 142   Recent Lipid Panel Lab Results  Component Value Date/Time   CHOL 103 02/06/2018 03:22 PM   CHOL 162 12/20/2012 10:44 AM   TRIG 83 02/06/2018 03:22 PM   TRIG 91 12/25/2014 10:20 AM   TRIG 83 12/20/2012 10:44 AM   HDL 32 (L) 02/06/2018 03:22 PM   HDL 42 12/25/2014 10:20 AM   HDL 42 12/20/2012 10:44 AM   CHOLHDL 3.2 02/06/2018 03:22 PM   LDLCALC 54 02/06/2018 03:22 PM   LDLCALC 59 01/29/2014 10:31 AM   LDLCALC 103 (H) 12/20/2012 10:44 AM    Wt Readings from Last 3 Encounters:  11/25/19 218 lb (98.9 kg)  10/25/19 228 lb (103.4 kg)  09/17/19 219 lb (99.3 kg)     Objective:    Vital Signs:  Ht 5' 2"  (1.575 m)   BMI 39.87 kg/m    VITAL SIGNS:  reviewed Unable to examine  ASSESSMENT & PLAN:    1. Palpitations: Well-controlled on diltiazem, without side effects. 2. HTN: Unable to check her blood pressure today, but very recently in her PCPs office blood pressure was 134/62. 3. Cirrhosis: No overt signs or symptoms of portal hypertension or parenchymal insufficiency, other than thrombocytopenia.  Uncertain etiology, presumed due to fatty liver, causing her thrombocytopenia. She has chronic minimal elevation in AST around 51 with normal ALT, normal albumin level, normal bilirubin level.  Borderline INR at 1.3 4. HLP: Chronically low HDL, likely related to severe obesity.  LDL in target range. 5. OSA: I encouraged her to use the CPAP 100% of the time. 6. Severe obesity: Likely the major underlying cause for her dyslipidemia, hypertension, sleep apnea and possibly her liver problem.   COVID-19 Education: The signs and symptoms of COVID-19 were discussed with the patient and how to seek care for testing (follow up with PCP or arrange E-visit).  The importance of social distancing was discussed today.  Time:   Today, I have spent 21 minutes with the patient with telehealth technology discussing the above problems.     Medication Adjustments/Labs and Tests  Ordered: Current medicines are reviewed at length with the patient today.  Concerns regarding medicines are outlined above.   Tests Ordered: No orders of the defined types were placed in this  encounter.   Medication Changes: No orders of the defined types were placed in this encounter.   Follow Up:  Virtual Visit or In Person 12 months  Signed, Sanda Klein, MD  12/26/2019 8:05 AM    [place]

## 2019-12-31 DIAGNOSIS — M25562 Pain in left knee: Secondary | ICD-10-CM | POA: Diagnosis not present

## 2020-01-01 ENCOUNTER — Telehealth: Payer: Self-pay | Admitting: Gastroenterology

## 2020-01-01 NOTE — Telephone Encounter (Signed)
Pt's daughter stated that Dr. Stann Mainland, orthopedic MD prescribed prednisone for pt.  Daughter inquired whether pt can take prednisone with liver cirrhosis.

## 2020-01-02 NOTE — Telephone Encounter (Signed)
Please advise on prednisone use. Thanks

## 2020-01-02 NOTE — Telephone Encounter (Signed)
Ok, its fine.

## 2020-01-02 NOTE — Telephone Encounter (Signed)
Advised 

## 2020-01-02 NOTE — Telephone Encounter (Signed)
Left a message for the patient or daughter to call back to discuss. Ask for [name].

## 2020-01-02 NOTE — Telephone Encounter (Signed)
Can you ask daughter the indication for steroids and how long she needs it? Thanks Steroid use is not contraindicated with cirrhosis but will need to assess the overall benefit as compared to potential risks

## 2020-01-02 NOTE — Telephone Encounter (Signed)
Spoke with the daughter. Patient is being treated for inflammation in her knees. Both are hurting. She will be on a 14 day taper.

## 2020-01-04 ENCOUNTER — Other Ambulatory Visit: Payer: Self-pay | Admitting: Gastroenterology

## 2020-01-07 ENCOUNTER — Other Ambulatory Visit: Payer: Self-pay | Admitting: Family Medicine

## 2020-01-07 ENCOUNTER — Other Ambulatory Visit: Payer: Self-pay | Admitting: Physician Assistant

## 2020-01-07 DIAGNOSIS — E119 Type 2 diabetes mellitus without complications: Secondary | ICD-10-CM

## 2020-01-09 NOTE — Telephone Encounter (Signed)
Rx has been sent to the pharmacy electronically. ° °

## 2020-01-14 ENCOUNTER — Other Ambulatory Visit: Payer: Self-pay | Admitting: Family Medicine

## 2020-01-14 DIAGNOSIS — E785 Hyperlipidemia, unspecified: Secondary | ICD-10-CM

## 2020-01-31 ENCOUNTER — Ambulatory Visit (INDEPENDENT_AMBULATORY_CARE_PROVIDER_SITE_OTHER): Payer: PPO | Admitting: *Deleted

## 2020-01-31 ENCOUNTER — Telehealth: Payer: Self-pay | Admitting: Gastroenterology

## 2020-01-31 DIAGNOSIS — Z0000 Encounter for general adult medical examination without abnormal findings: Secondary | ICD-10-CM

## 2020-01-31 MED ORDER — LACTULOSE 10 GM/15ML PO SOLN: ORAL | 2 refills | Status: DC

## 2020-01-31 NOTE — Telephone Encounter (Signed)
Lactulose sent to patients pharmacy as requested

## 2020-01-31 NOTE — Patient Instructions (Signed)
Preventive Care 72 Years and Older, Female Preventive care refers to lifestyle choices and visits with your health care provider that can promote health and wellness. This includes:  A yearly physical exam. This is also called an annual well check.  Regular dental and eye exams.  Immunizations.  Screening for certain conditions.  Healthy lifestyle choices, such as diet and exercise. What can I expect for my preventive care visit? Physical exam Your health care provider will check:  Height and weight. These may be used to calculate body mass index (BMI), which is a measurement that tells if you are at a healthy weight.  Heart rate and blood pressure.  Your skin for abnormal spots. Counseling Your health care provider may ask you questions about:  Alcohol, tobacco, and drug use.  Emotional well-being.  Home and relationship well-being.  Sexual activity.  Eating habits.  History of falls.  Memory and ability to understand (cognition).  Work and work Statistician.  Pregnancy and menstrual history. What immunizations do I need?  Influenza (flu) vaccine  This is recommended every year. Tetanus, diphtheria, and pertussis (Tdap) vaccine  You may need a Td booster every 10 years. Varicella (chickenpox) vaccine  You may need this vaccine if you have not already been vaccinated. Zoster (shingles) vaccine  You may need this after age 39. Pneumococcal conjugate (PCV13) vaccine  One dose is recommended after age 72. Pneumococcal polysaccharide (PPSV23) vaccine  One dose is recommended after age 13. Measles, mumps, and rubella (MMR) vaccine  You may need at least one dose of MMR if you were born in 1957 or later. You may also need a second dose. Meningococcal conjugate (MenACWY) vaccine  You may need this if you have certain conditions. Hepatitis A vaccine  You may need this if you have certain conditions or if you travel or work in places where you may be exposed  to hepatitis A. Hepatitis B vaccine  You may need this if you have certain conditions or if you travel or work in places where you may be exposed to hepatitis B. Haemophilus influenzae type b (Hib) vaccine  You may need this if you have certain conditions. You may receive vaccines as individual doses or as more than one vaccine together in one shot (combination vaccines). Talk with your health care provider about the risks and benefits of combination vaccines. What tests do I need? Blood tests  Lipid and cholesterol levels. These may be checked every 5 years, or more frequently depending on your overall health.  Hepatitis C test.  Hepatitis B test. Screening  Lung cancer screening. You may have this screening every year starting at age 72 if you have a 30-pack-year history of smoking and currently smoke or have quit within the past 15 years.  Colorectal cancer screening. All adults should have this screening starting at age 72 and continuing until age 4. Your health care provider may recommend screening at age 72 if you are at increased risk. You will have tests every 1-10 years, depending on your results and the type of screening test.  Diabetes screening. This is done by checking your blood sugar (glucose) after you have not eaten for a while (fasting). You may have this done every 1-3 years.  Mammogram. This may be done every 1-2 years. Talk with your health care provider about how often you should have regular mammograms.  BRCA-related cancer screening. This may be done if you have a family history of breast, ovarian, tubal, or peritoneal cancers.  Other tests °· Sexually transmitted disease (STD) testing. °· Bone density scan. This is done to screen for osteoporosis. You may have this done starting at age 72. °Follow these instructions at home: °Eating and drinking °· Eat a diet that includes fresh fruits and vegetables, whole grains, lean protein, and low-fat dairy products. Limit  your intake of foods with high amounts of sugar, saturated fats, and salt. °· Take vitamin and mineral supplements as recommended by your health care provider. °· Do not drink alcohol if your health care provider tells you not to drink. °· If you drink alcohol: °? Limit how much you have to 0-1 drink a day. °? Be aware of how much alcohol is in your drink. In the [place] one drink equals one 12 oz bottle of beer (355 mL), one 5 oz glass of wine (148 mL), or one 1½ oz glass of hard liquor (44 mL). °Lifestyle °· Take daily care of your teeth and gums. °· Stay active. Exercise for at least 30 minutes on 5 or more days each week. °· Do not use any products that contain nicotine or tobacco, such as cigarettes, e-cigarettes, and chewing tobacco. If you need help quitting, ask your health care provider. °· If you are sexually active, practice safe sex. Use a condom or other form of protection in order to prevent STIs (sexually transmitted infections). °· Talk with your health care provider about taking a low-dose aspirin or statin. °What's next? °· Go to your health care provider once a year for a well check visit. °· Ask your health care provider how often you should have your eyes and teeth checked. °· Stay up to date on all vaccines. °This information is not intended to replace advice given to you by your health care provider. Make sure you discuss any questions you have with your health care provider. °Document Revised: 07/26/2018 Document Reviewed: 07/26/2018 °Elsevier Patient Education © 2020 [employer] Inc. ° °

## 2020-01-31 NOTE — Progress Notes (Signed)
MEDICARE ANNUAL WELLNESS VISIT  01/31/2020  Telephone Visit Disclaimer This Medicare AWV was conducted by telephone due to national recommendations for restrictions regarding the COVID-19 Pandemic (e.g. social distancing).  I verified, using two identifiers, that I am speaking with Stacey Summers or their authorized healthcare agent. I discussed the limitations, risks, security, and privacy concerns of performing an evaluation and management service by telephone and the potential availability of an in-person appointment in the future. The patient expressed understanding and agreed to proceed.   Subjective:  Stacey Summers is a 74 y.o. female patient of Stacey Norlander, DO who had a Medicare Annual Wellness Visit today via telephone. Nyomie is Retired and lives with their spouse. she has 3 children. she reports that she is socially active and does interact with friends/family regularly. she is minimally physically active and enjoys staying active in [place] and working with the youth group.  Patient Care Team: Stacey Norlander, DO as PCP - General (Family Medicine) Croitoru, Dani Gobble, MD as PCP - Cardiology (Cardiology) Noralee Space, MD as Consulting Physician (Pulmonary Disease) Mauri Pole, MD as Consulting Physician (Gastroenterology) Harlen Labs, MD as Referring Physician (Optometry) Ilean China, RN as Registered Nurse  Advanced Directives 01/31/2020 10/10/2019 01/21/2019 07/04/2018 06/05/2018 04/20/2018 03/22/2018  Does Patient Have a Medical Advance Directive? _0  No No  Would patient like information on creating a medical advance directive? No - Patient declined No - Patient declined No - Patient declined No - Patient declined No - Patient declined No - Patient declined No - Patient declined    Hospital Utilization Over the Past 12 Months: # of hospitalizations or ER visits: 0 # of surgeries: 0  Review of Systems    Patient reports that her  overall health is unchanged compared to last year.  History obtained from chart review  Patient Reported Readings (BP, Pulse, CBG, Weight, etc) BP-116/50 Blood Sugar-140  Pain Assessment Pain : No/denies pain (pt states she isn't in pain while on the phone since she is sitting)     Current Medications & Allergies (verified) Allergies as of 01/31/2020      Reactions   Latex    "my hands feels numb"      Medication List       Accurate as of January 31, 2020  2:32 PM. If you have any questions, ask your nurse or doctor.        STOP taking these medications   omeprazole 40 MG capsule Commonly known as: PRILOSEC     TAKE these medications   ALPRAZolam 0.25 MG tablet Commonly known as: XANAX Take 1 tablet (0.25 mg total) by mouth 2 (two) times daily as needed for anxiety.   atorvastatin 40 MG tablet Commonly known as: LIPITOR Take 1 tablet by mouth once daily   benazepril 40 MG tablet Commonly known as: LOTENSIN Take 1 tablet by mouth once daily   blood glucose meter kit and supplies Kit Dispense based on patient and insurance preference. Use up to four times daily as directed. (FOR ICD-9 250.00, 250.01).   cyclobenzaprine 5 MG tablet Commonly known as: FLEXERIL TAKE 1 TABLET BY MOUTH THREE TIMES DAILY AS NEEDED FOR MUSCLE SPASMS (NEEDS APPOINTMENT)   diltiazem 120 MG 24 hr capsule Commonly known as: CARDIZEM CD Take 1 capsule by mouth once daily   fluticasone 50 MCG/ACT nasal spray Commonly known as: FLONASE Place 1 spray into both nostrils in the morning  and at bedtime.   furosemide 40 MG tablet Commonly known as: LASIX TAKE 1 TABLET BY MOUTH IN THE MORNING & 1/2 (ONE-HALF)TABLET AT 1 IN THE AFTERNOON   glucose blood test strip Test 1x per day and prn- dx 250.02   ONE TOUCH ULTRA TEST test strip Generic drug: glucose blood USE ONE STRIP TO CHECK GLUCOSE ONCE DAILY   lactulose 10 GM/15ML solution Commonly known as: CHRONULAC TAKE 15 ML BY MOUTH  THREE  TIMES DAILY   [place] Blood Glucose Monitor Devi 1 each by Does not apply route daily. Test 1x per day and prn-dx250.02   meclizine 25 MG tablet Commonly known as: ANTIVERT Take 1 tablet (25 mg total) by mouth 3 (three) times daily as needed for dizziness.   metFORMIN 500 MG tablet Commonly known as: GLUCOPHAGE Take 1 tablet (500 mg total) by mouth 2 (two) times daily with a meal. (Needs to be seen before next refill)   ondansetron 4 MG tablet Commonly known as: Zofran Take 1 tablet (4 mg total) by mouth every 8 (eight) hours as needed for nausea or vomiting.   OneTouch Delica Lancets 65L Misc USE ONE LANCET TO CHECK GLUCOSE ONCE DAILY AND AS NEEDED   rifaximin 550 MG Tabs tablet Commonly known as: XIFAXAN Take 1 tablet (550 mg total) by mouth 2 (two) times daily.   traMADol 50 MG tablet Commonly known as: ULTRAM Take 1 tablet (50 mg total) by mouth every 12 (twelve) hours as needed for moderate pain.   Vitamin D3 125 MCG (5000 UT) Caps Take 5,000 Units by mouth daily.       History (reviewed): Past Medical History:  Diagnosis Date   Allergy    seasonal   Anxiety    Arthritis    hands, back    Cataract    surgery to remove   Chronic back pain    lower back    Cirrhosis of liver without ascites ([place])    recent dx 07/11/18   COPD (chronic obstructive pulmonary disease) (HCC)    no inhaler   Diabetes mellitus without complication (HCC)    DJD (degenerative joint disease)    Fibromyalgia    Genetic testing 03/08/2017   Ms. Mceachern underwent genetic counseling and testing for hereditary cancer syndromes on 02/21/2017. Her results were negative for mutations in all 83 genes analyzed by [facility]'s 83-gene Multi-Cancers Panel. 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   History of abnormal mammogram    Hyperlipidemia    Hypertension    Irregular heart beat    Neuromuscular disorder (HCC)    fibromyalgia   Sleep apnea    cpap some   SVD (spontaneous vaginal delivery)    x 2   Tubular adenoma of colon 05/09/12   "innumerable number" of flat polyps   Past Surgical History:  Procedure Laterality Date   CARPAL TUNNEL RELEASE Left 10/20/14   [facility]   CATARACT EXTRACTION, BILATERAL     CESAREAN SECTION  1977   CHOLECYSTECTOMY  1990   COLONOSCOPY     IR RADIOLOGIST EVAL & MGMT  07/16/2019   KNEE ARTHROSCOPY WITH SUBCHONDROPLASTY Right 03/27/2018   Procedure: Right knee arthroscopic partial medial meniscus repair versus menisectomy with medial tibia subchondroplasty;  Surgeon:  Nicholes Stairs, MD;  Location: [facility];  Service: Orthopedics;  Laterality: Right;  120 mins   LARYNX SURGERY     POLYP REMOVAL   MULTIPLE TOOTH EXTRACTIONS     POLYPECTOMY     vocal cord   TONSILLECTOMY  1965   TUBAL LIGATION     TUMOR REMOVAL     benign fatty tumors on abdomen   UPPER GASTROINTESTINAL ENDOSCOPY     Family History  Problem Relation Age of Onset   Prostate cancer Father        d.89 diagnosed in late-60s   Colon cancer Father        diagnosed at 48   Heart failure Mother        d.37   Heart disease Mother    Colon polyps Brother        polyposis. Underwent partial colectomy at age 57.   Spina bifida Brother        died at birth   Stomach cancer Paternal Aunt        questionable   Kidney cancer Cousin        d.80s maternal first-cousin   Other Other 40       multiple lipomas   Colon cancer Paternal Uncle        in his 45s   Heart failure Maternal Grandmother        d.45   Heart failure Maternal Uncle        d.50   Colon polyps Son 49   Heart attack Son 6   Leukemia Maternal Aunt    Esophageal cancer Neg Hx    Rectal cancer Neg Hx    Social  History   Socioeconomic History   Marital status: Married    Spouse name: [name]   Number of children: 3   Years of education: 11   Highest education level: GED or equivalent  Occupational History   Occupation: retired  Tobacco Use   Smoking status: Former Smoker    Packs/day: 1.00    Years: 3.00    Pack years: 3.00    Types: Cigarettes    Quit date: 08/15/1969    Years since quitting: 50.4   Smokeless tobacco: Never Used  Vaping Use   Vaping Use: Never used  Substance and Sexual Activity   Alcohol use: No    Alcohol/week: 0.0 standard drinks   Drug use: No   Sexual activity: Not Currently  Other Topics Concern   Not on file  Social History Narrative   Not on file   Social Determinants of Health   Financial Resource Strain: Low Risk    Difficulty of Paying Living Expenses: Not hard at all  Food Insecurity: No Food Insecurity   Worried About Charity fundraiser in the Last Year: Never true   [place] in the Last Year: Never true  Transportation Needs: No Transportation Needs   Lack of Transportation (Medical): No   Lack of Transportation (Non-Medical): No  Physical Activity: Inactive   Days of Exercise per Week: 0 days   Minutes of Exercise per Session: 0 min  Stress: No Stress Concern Present   Feeling of Stress : Only a little  Social Connections: Engineer, building services of Communication with Friends and Family: More than three times a week   Frequency of Social Gatherings with Friends and Family: More than three times a week   Attends Religious Services: More than 4 times per year  Active Member of Clubs or Organizations: Yes   Attends Archivist Meetings: More than 4 times per year   Marital Status: Married    Activities of Daily Living In your present state of health, do you have any difficulty performing the following activities: 01/31/2020  Hearing? Y  Comment wears bilateral hearing aids  Vision? N    Comment wears rx glasses-had diabetic eye exam 3 months ago at [facility] in [place]-records requested  Difficulty concentrating or making decisions? N  Walking or climbing stairs? N  Dressing or bathing? N  Doing errands, shopping? N  Preparing Food and eating ? N  Using the Toilet? N  In the past six months, have you accidently leaked urine? N  Do you have problems with loss of bowel control? N  Managing your Medications? N  Managing your Finances? N  Housekeeping or managing your Housekeeping? Y  Comment her neighbor comes to mop, vacuum and sweep but she can do everything else  Some recent data might be hidden    Patient Education/ Literacy How often do you need to have someone help you when you read instructions, pamphlets, or other written materials from your doctor or pharmacy?: 1 - Never What is the last grade level you completed in school?: GED  Exercise Current Exercise Habits: The patient does not participate in regular exercise at present, Exercise limited by: respiratory conditions(s);orthopedic condition(s)  Diet Patient reports consuming 3 meals a day and 1 snack(s) a day Patient reports that her primary diet is: Regular Patient reports that she does have regular access to food.   Depression Screen PHQ 2/9 Scores 01/31/2020 10/25/2019 04/02/2019 01/21/2019 10/09/2018 02/06/2018 12/26/2017  PHQ - 2 Score 0 0 0 0 0 0 0  PHQ- 9 Score - 3 0 - 2 - -     Fall Risk Fall Risk  01/31/2020 10/25/2019 01/21/2019 10/09/2018 02/06/2018  Falls in the past year? 0 0 0 0 No  Number falls in past yr: - - - - -  Injury with Fall? - - - - -  Risk for fall due to : - - - - -     Objective:  Stacey Castle Zumbro seemed alert and oriented and she participated appropriately during our telephone visit.  Blood Pressure Weight BMI  BP Readings from Last 3 Encounters:  11/25/19 134/62  10/25/19 126/61  09/17/19 126/68   Wt Readings from Last 3 Encounters:  11/25/19 218 lb (98.9 kg)   10/25/19 228 lb (103.4 kg)  09/17/19 219 lb (99.3 kg)   BMI Readings from Last 1 Encounters:  12/25/19 39.87 kg/m    *Unable to obtain current vital signs, weight, and BMI due to telephone visit type  Hearing/Vision   Stevi did not seem to have difficulty with hearing/understanding during the telephone conversation  Reports that she has had a formal eye exam by an eye care professional within the past year  Reports that she has not had a formal hearing evaluation within the past year *Unable to fully assess hearing and vision during telephone visit type  Cognitive Function: 6CIT Screen 01/31/2020 01/21/2019  What Year? 0 points 0 points  What month? 0 points 0 points  What time? 0 points 0 points  Count back from 20 2 points 0 points  Months in reverse 0 points 0 points  Repeat phrase 2 points 0 points  Total Score 4 0   (Normal:0-7, Significant for Dysfunction: >8)  Normal Cognitive Function Screening: Yes  Immunization & Health Maintenance Record Immunization History  Administered Date(s) Administered   Hepb-cpg 07/19/2018, 08/20/2018   Influenza, High Dose Seasonal PF 08/03/2018   Influenza,inj,Quad PF,6+ Mos 05/28/2014, 06/16/2015, 06/10/2016   Influenza,inj,quad, With Preservative 05/15/2017, 08/03/2018   Influenza-Unspecified 04/24/2017   Pneumococcal Conjugate-13 07/21/2014   Pneumococcal Polysaccharide-23 09/21/2015   Tdap 07/07/2009   Zoster Recombinat (Shingrix) 04/24/2017, 07/11/2017    Health Maintenance  Topic Date Due   COVID-19 Vaccine (1) Never done   OPHTHALMOLOGY EXAM  12/29/2018   TETANUS/TDAP  07/08/2019   HEMOGLOBIN A1C  10/03/2019   FOOT EXAM  10/10/2019   INFLUENZA VACCINE  03/15/2020   MAMMOGRAM  06/02/2020   COLONOSCOPY  08/07/2021   DEXA SCAN  Completed   Hepatitis C Screening  Completed   PNA vac Low Risk Adult  Completed       Assessment  This is a routine wellness examination for Cherril L  Woodroof.  Health Maintenance: Due or Overdue Health Maintenance Due  Topic Date Due   COVID-19 Vaccine (1) Never done   OPHTHALMOLOGY EXAM  12/29/2018   TETANUS/TDAP  07/08/2019   HEMOGLOBIN A1C  10/03/2019   FOOT EXAM  10/10/2019    Stacey Castle Nazar does not need a referral for Community Assistance: Care Management:   Enrolled with Chong Sicilian, RN Social Work:    no Prescription Assistance:  no Nutrition/Diabetes Education:  no   Plan:  Personalized Goals Goals Addressed            This Visit's Progress    DIET - INCREASE WATER INTAKE       Try to drink 6-8 glasses of water daily      Personalized Health Maintenance & Screening Recommendations  Td vaccine COVID vaccine  Lung Cancer Screening Recommended: no (Low Dose CT Chest recommended if Age 25-80 years, 30 pack-year currently smoking OR have quit w/in past 15 years) Hepatitis C Screening recommended: no HIV Screening recommended: no  Advanced Directives: Written information was not prepared per patient's request.  Referrals & Orders No orders of the defined types were placed in this encounter.   Follow-up Plan  Follow-up with Stacey Norlander, DO as planned  Consider TDAP vaccine at your next visit with your PCP  Consider COVID vaccine at your local pharmacy   I have personally reviewed and noted the following in the patients chart:    Medical and social history  Use of alcohol, tobacco or illicit drugs   Current medications and supplements  Functional ability and status  Nutritional status  Physical activity  Advanced directives  List of other physicians  Hospitalizations, surgeries, and ER visits in previous 12 months  Vitals  Screenings to include cognitive, depression, and falls  Referrals and appointments  In addition, I have reviewed and discussed with Stacey Castle Firkus certain preventive protocols, quality metrics, and best practice recommendations. A written  personalized care plan for preventive services as well as general preventive health recommendations is available and can be mailed to the patient at her request.      Milas Hock, LPN  [id number]

## 2020-02-03 ENCOUNTER — Other Ambulatory Visit: Payer: Self-pay | Admitting: Gastroenterology

## 2020-02-05 ENCOUNTER — Inpatient Hospital Stay (HOSPITAL_COMMUNITY): Payer: PPO | Attending: Hematology

## 2020-02-05 ENCOUNTER — Other Ambulatory Visit: Payer: Self-pay

## 2020-02-05 DIAGNOSIS — E119 Type 2 diabetes mellitus without complications: Secondary | ICD-10-CM | POA: Diagnosis not present

## 2020-02-05 DIAGNOSIS — R911 Solitary pulmonary nodule: Secondary | ICD-10-CM | POA: Diagnosis not present

## 2020-02-05 DIAGNOSIS — Z800 Family history of malignant neoplasm of digestive organs: Secondary | ICD-10-CM | POA: Insufficient documentation

## 2020-02-05 DIAGNOSIS — G4730 Sleep apnea, unspecified: Secondary | ICD-10-CM | POA: Insufficient documentation

## 2020-02-05 DIAGNOSIS — R110 Nausea: Secondary | ICD-10-CM | POA: Insufficient documentation

## 2020-02-05 DIAGNOSIS — R0602 Shortness of breath: Secondary | ICD-10-CM | POA: Insufficient documentation

## 2020-02-05 DIAGNOSIS — R42 Dizziness and giddiness: Secondary | ICD-10-CM | POA: Diagnosis not present

## 2020-02-05 DIAGNOSIS — M1990 Unspecified osteoarthritis, unspecified site: Secondary | ICD-10-CM | POA: Diagnosis not present

## 2020-02-05 DIAGNOSIS — Z87891 Personal history of nicotine dependence: Secondary | ICD-10-CM | POA: Insufficient documentation

## 2020-02-05 DIAGNOSIS — E785 Hyperlipidemia, unspecified: Secondary | ICD-10-CM | POA: Diagnosis not present

## 2020-02-05 DIAGNOSIS — M549 Dorsalgia, unspecified: Secondary | ICD-10-CM | POA: Insufficient documentation

## 2020-02-05 DIAGNOSIS — Z79899 Other long term (current) drug therapy: Secondary | ICD-10-CM | POA: Insufficient documentation

## 2020-02-05 DIAGNOSIS — K7460 Unspecified cirrhosis of liver: Secondary | ICD-10-CM | POA: Diagnosis not present

## 2020-02-05 DIAGNOSIS — J449 Chronic obstructive pulmonary disease, unspecified: Secondary | ICD-10-CM | POA: Diagnosis not present

## 2020-02-05 DIAGNOSIS — Z806 Family history of leukemia: Secondary | ICD-10-CM | POA: Insufficient documentation

## 2020-02-05 DIAGNOSIS — M797 Fibromyalgia: Secondary | ICD-10-CM | POA: Insufficient documentation

## 2020-02-05 DIAGNOSIS — D696 Thrombocytopenia, unspecified: Secondary | ICD-10-CM | POA: Insufficient documentation

## 2020-02-05 DIAGNOSIS — I10 Essential (primary) hypertension: Secondary | ICD-10-CM | POA: Diagnosis not present

## 2020-02-05 DIAGNOSIS — F419 Anxiety disorder, unspecified: Secondary | ICD-10-CM | POA: Diagnosis not present

## 2020-02-05 DIAGNOSIS — Z8042 Family history of malignant neoplasm of prostate: Secondary | ICD-10-CM | POA: Diagnosis not present

## 2020-02-05 DIAGNOSIS — K219 Gastro-esophageal reflux disease without esophagitis: Secondary | ICD-10-CM | POA: Insufficient documentation

## 2020-02-05 DIAGNOSIS — Z8052 Family history of malignant neoplasm of bladder: Secondary | ICD-10-CM | POA: Diagnosis not present

## 2020-02-05 LAB — CBC WITH DIFFERENTIAL/PLATELET
Abs Immature Granulocytes: 0.01 10*3/uL (ref 0.00–0.07)
Basophils Absolute: 0 10*3/uL (ref 0.0–0.1)
Basophils Relative: 1 %
Eosinophils Absolute: 0.1 10*3/uL (ref 0.0–0.5)
Eosinophils Relative: 3 %
HCT: 40.8 % (ref 36.0–46.0)
Hemoglobin: 13.4 g/dL (ref 12.0–15.0)
Immature Granulocytes: 0 %
Lymphocytes Relative: 39 %
Lymphs Abs: 2.2 10*3/uL (ref 0.7–4.0)
MCH: 32.8 pg (ref 26.0–34.0)
MCHC: 32.8 g/dL (ref 30.0–36.0)
MCV: 99.8 fL (ref 80.0–100.0)
Monocytes Absolute: 0.6 10*3/uL (ref 0.1–1.0)
Monocytes Relative: 11 %
Neutro Abs: 2.6 10*3/uL (ref 1.7–7.7)
Neutrophils Relative %: 46 %
Platelets: 91 10*3/uL — ABNORMAL LOW (ref 150–400)
RBC: 4.09 MIL/uL (ref 3.87–5.11)
RDW: 13.4 % (ref 11.5–15.5)
WBC: 5.5 10*3/uL (ref 4.0–10.5)
nRBC: 0 % (ref 0.0–0.2)

## 2020-02-12 ENCOUNTER — Inpatient Hospital Stay (HOSPITAL_COMMUNITY): Payer: PPO | Admitting: Nurse Practitioner

## 2020-02-12 DIAGNOSIS — D696 Thrombocytopenia, unspecified: Secondary | ICD-10-CM | POA: Diagnosis not present

## 2020-02-12 NOTE — Assessment & Plan Note (Addendum)
1.Thrombocytopenia: -Initially platelets were 83.  -Likely due from cirrhosis and splenomegaly based on her last CT scan. -CT of abdomen and pelvis on 06/27/2018 showed cirrhosis. 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 There is a 2 mm nodule in the right middle lobe. -Labs on 02/05/2020 showed platelet count of 91. -She will follow up in 3 months with labs.  2.Cirrhosis: -CT of abdomen and pelvis on 06/27/2018 showed cirrhosis. -She is being followed by GI for this.  She was placed on rifaximin and lactulose.  3.Joint pain: -she had a full work up including SPEP, CRP, ANA, RF which all were negative. -She did have an elevated sed rate of 37. Patient does have a history of fibromyalgia. -she follows up with her PCP.  4.Elevated Bilirubin: -Labs showed elevated bilirubin dating back to 2017. -CT of abdomen on 06/27/2018 showed cirrhosis, splenomegaly, and evidence of cholecystectomy. -Labs on 06/28/2019 showed bilirubin 3.8. -Labs on 11/25/2019 showed bilirubin 2.9 -She is being followed closely by GI and is on lactulose.  5.Pulmonary nodule: -CT scan done on 06/27/2018 showed a 2 mm right middle lobe nodule.  -CXR was done on 06/27/2018 and showed no acute process.  - Patient denies smoking. -CT of chest done on, compatible with infectious/inflammatory allergy.  Given the history of recent Covid infection this is likely reflex viral pneumonia.  Also showed mild mediastinal lymphadenopathy, presumably reactive.  Repeat CT recommended. -CT of chest repeated on 10/04/2019 which showed resolution of COVID-19 pneumonia.  No acute process in the chest.  Bilateral pulmonary nodules of maximally 4 mm.  No follow-up needed CT of chest repeat in 12 months. -We will repeat a CT of the chest February 2022  6. Health maintenance: -Mammogram done on 06/03/2019 was B-RADS category 1 negative. -Last colonoscopy on 12/06/2016 showed several  polyps that were negative for malignancy. -Patient has a family history of colon cancer and has ungone genetic testing which was negative.

## 2020-02-12 NOTE — Progress Notes (Signed)
[place], [place] [address]   CLINIC:  Medical Oncology/Hematology  PCP:  Janora Norlander, DO [place] [address] [phone]   REASON FOR VISIT: Follow-up for thrombocytopenia   CURRENT THERAPY: Observation   INTERVAL HISTORY:  Ms. Strieter 74 y.o. female returns for routine follow-up for thrombocytopenia.  Patient reports she is doing well since her last visit.  She denies any new bleeding issues.  She denies any petechiae. Denies any nausea, vomiting, or diarrhea. Denies any new pains. Had not noticed any recent bleeding such as epistaxis, hematuria or hematochezia. Denies recent chest pain on exertion, shortness of breath on minimal exertion, pre-syncopal episodes, or palpitations. Denies any numbness or tingling in hands or feet. Denies any recent fevers, infections, or recent hospitalizations. Patient reports appetite at 75% and energy level at 75%.  She is eating well maintain her weight at this time.     REVIEW OF SYSTEMS:  Review of Systems  Respiratory: Positive for shortness of breath.   Gastrointestinal: Positive for nausea.  Neurological: Positive for dizziness.  All other systems reviewed and are negative.    PAST MEDICAL/SURGICAL HISTORY:  Past Medical History:  Diagnosis Date  . Allergy    seasonal  . Anxiety   . Arthritis    hands, back   . Cataract    surgery to remove  . Chronic back pain    lower back   . Cirrhosis of liver without ascites ([place])    recent dx 07/11/18  . COPD (chronic obstructive pulmonary disease) (HCC)    no inhaler  . Diabetes mellitus without complication ([place])   . DJD (degenerative joint disease)   . Fibromyalgia   . Genetic testing 03/08/2017   Ms. Raper underwent genetic counseling and testing for hereditary cancer syndromes on 02/21/2017. Her results were negative for mutations in all 83 genes analyzed by [facility]'s 83-gene Multi-Cancers Panel. Genes analyzed  include: ALK, APC, ATM, AXIN2, BAP1, BARD1, BLM, BMPR1A, BRCA1, BRCA2, BRIP1, CASR, CDC73, CDH1, CDK4, CDKN1B, CDKN1C, CDKN2A, CEBPA, CHEK2, CTNNA1, DICER1, DIS3L2, EGFR, EPCAM  . GERD (gastroesophageal reflux disease)   . 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 History of abnormal mammogram   . Hyperlipidemia   . Hypertension   . Irregular heart beat   . Neuromuscular disorder (HCC)    fibromyalgia  . Sleep apnea    cpap some  . SVD (spontaneous vaginal delivery)    x 2  . Tubular adenoma of colon 05/09/12   "innumerable number" of flat polyps   Past Surgical History:  Procedure Laterality Date  . CARPAL TUNNEL RELEASE Left 10/20/14   [facility]  . CATARACT EXTRACTION, BILATERAL    . [place]  . CHOLECYSTECTOMY  1990  . COLONOSCOPY    . IR RADIOLOGIST EVAL & MGMT  07/16/2019  . KNEE ARTHROSCOPY WITH SUBCHONDROPLASTY Right 03/27/2018   Procedure: Right knee arthroscopic partial medial meniscus repair versus menisectomy with medial tibia subchondroplasty;  Surgeon: Nicholes Stairs, MD;  Location: [facility];  Service: Orthopedics;  Laterality: Right;  120 mins  . LARYNX SURGERY     POLYP REMOVAL  . MULTIPLE TOOTH EXTRACTIONS    . POLYPECTOMY     vocal cord  . TONSILLECTOMY  1965  . TUBAL LIGATION    . TUMOR REMOVAL     benign fatty tumors on abdomen  . UPPER GASTROINTESTINAL ENDOSCOPY  SOCIAL HISTORY:  Social History   Socioeconomic History  . Marital status: Married    Spouse name: [name]  . Number of children: 3  . Years of education: 20  . Highest education level: GED or equivalent  Occupational History  . Occupation: retired  Tobacco Use  . Smoking status: Former Smoker    Packs/day: 1.00    Years: 3.00    Pack years: 3.00    Types: Cigarettes    Quit date: 08/15/1969    Years since quitting: 50.5  . Smokeless tobacco: Never Used  Vaping Use  . Vaping Use: Never used  Substance and Sexual Activity  . Alcohol use: No     Alcohol/week: 0.0 standard drinks  . Drug use: No  . Sexual activity: Not Currently  Other Topics Concern  . Not on file  Social History Narrative  . Not on file   Social Determinants of Health   Financial Resource Strain: Low Risk   . Difficulty of Paying Living Expenses: Not hard at all  Food Insecurity: No Food Insecurity  . Worried About Charity fundraiser in the Last Year: Never true  . Ran Out of Food in the Last Year: Never true  Transportation Needs: No Transportation Needs  . Lack of Transportation (Medical): No  . Lack of Transportation (Non-Medical): No  Physical Activity: Inactive  . Days of Exercise per Week: 0 days  . Minutes of Exercise per Session: 0 min  Stress: No Stress Concern Present  . Feeling of Stress : Only a little  Social Connections: Socially Integrated  . Frequency of Communication with Friends and Family: More than three times a week  . Frequency of Social Gatherings with Friends and Family: More than three times a week  . Attends Religious Services: More than 4 times per year  . Active Member of Clubs or Organizations: Yes  . Attends Archivist Meetings: More than 4 times per year  . Marital Status: Married  Human resources officer Violence: Not At Risk  . Fear of Current or Ex-Partner: No  . Emotionally Abused: No  . Physically Abused: No  . Sexually Abused: No    FAMILY HISTORY:  Family History  Problem Relation Age of Onset  . Prostate cancer Father        d.89 diagnosed in late-60s  . Colon cancer Father        diagnosed at 36  . Heart failure Mother        d.37  . Heart disease Mother   . Colon polyps Brother        polyposis. Underwent partial colectomy at age 43.  Marland Kitchen Spina bifida Brother        died at birth  . Stomach cancer Paternal Aunt        questionable  . Kidney cancer Cousin        d.78s maternal first-cousin  . Other Other 16       multiple lipomas  . Colon cancer Paternal Uncle        in his 22s  . Heart  failure Maternal Grandmother        d.45  . Heart failure Maternal Uncle        d.50  . Colon polyps Son 8  . Heart attack Son 75  . Leukemia Maternal Aunt   . Esophageal cancer Neg Hx   . Rectal cancer Neg Hx     CURRENT MEDICATIONS:  Outpatient Encounter Medications as of 02/12/2020  Medication Sig  . atorvastatin (LIPITOR) 40 MG tablet Take 1 tablet by mouth once daily  . benazepril (LOTENSIN) 40 MG tablet Take 1 tablet by mouth once daily  . blood glucose meter kit and supplies KIT Dispense based on patient and insurance preference. Use up to four times daily as directed. (FOR ICD-9 250.00, 250.01).  . Blood Glucose Monitoring Suppl (LIBERTY BLOOD GLUCOSE MONITOR) DEVI 1 each by Does not apply route daily. Test 1x per day and prn-dx250.02  . Cholecalciferol (VITAMIN D3) 5000 units CAPS Take 5,000 Units by mouth daily.  Marland Kitchen diltiazem (CARDIZEM CD) 120 MG 24 hr capsule Take 1 capsule by mouth once daily  . fluticasone (FLONASE) 50 MCG/ACT nasal spray Place 1 spray into both nostrils in the morning and at bedtime.  . furosemide (LASIX) 40 MG tablet TAKE 1 TABLET BY MOUTH IN THE MORNING & 1/2 (ONE-HALF)TABLET AT 1 IN THE AFTERNOON  . glucose blood test strip Test 1x per day and prn- dx 250.02  . lactulose (CHRONULAC) 10 GM/15ML solution TAKE 15 ML BY MOUTH  THREE TIMES DAILY  . metFORMIN (GLUCOPHAGE) 500 MG tablet Take 1 tablet (500 mg total) by mouth 2 (two) times daily with a meal. (Needs to be seen before next refill)  . ondansetron (ZOFRAN) 4 MG tablet Take 1 tablet (4 mg total) by mouth every 8 (eight) hours as needed for nausea or vomiting.  . ONE TOUCH ULTRA TEST test strip USE ONE STRIP TO CHECK GLUCOSE ONCE DAILY  . ONETOUCH DELICA LANCETS 36O MISC USE ONE LANCET TO CHECK GLUCOSE ONCE DAILY AND AS NEEDED  . rifaximin (XIFAXAN) 550 MG TABS tablet Take 1 tablet (550 mg total) by mouth 2 (two) times daily.  Marland Kitchen ALPRAZolam (XANAX) 0.25 MG tablet Take 1 tablet (0.25 mg total) by mouth 2  (two) times daily as needed for anxiety. (Patient not taking: Reported on 02/12/2020)  . cyclobenzaprine (FLEXERIL) 5 MG tablet TAKE 1 TABLET BY MOUTH THREE TIMES DAILY AS NEEDED FOR MUSCLE SPASMS (NEEDS APPOINTMENT) (Patient not taking: Reported on 02/12/2020)  . meclizine (ANTIVERT) 25 MG tablet Take 1 tablet (25 mg total) by mouth 3 (three) times daily as needed for dizziness. (Patient not taking: Reported on 02/12/2020)  . traMADol (ULTRAM) 50 MG tablet Take 1 tablet (50 mg total) by mouth every 12 (twelve) hours as needed for moderate pain. (Patient not taking: Reported on 02/12/2020)   No facility-administered encounter medications on file as of 02/12/2020.    ALLERGIES:  Allergies  Allergen Reactions  . Latex     "my hands feels numb"     PHYSICAL EXAM:  ECOG Performance status: 1  Vitals:   02/12/20 1120  BP: (!) 165/67  Pulse: (!) 54  Resp: 18  Temp: (!) 97.3 F (36.3 C)  SpO2: 96%   Filed Weights   02/12/20 1120  Weight: 219 lb 11.2 oz (99.7 kg)   Physical Exam Constitutional:      Appearance: Normal appearance. She is normal weight.  Cardiovascular:     Rate and Rhythm: Normal rate and regular rhythm.     Heart sounds: Normal heart sounds.  Pulmonary:     Effort: Pulmonary effort is normal.     Breath sounds: Normal breath sounds.  Abdominal:     General: Bowel sounds are normal.     Palpations: Abdomen is soft.  Musculoskeletal:        General: Normal range of motion.  Skin:    General: Skin is warm.  Neurological:     Mental Status: She is alert and oriented to person, place, and time. Mental status is at baseline.  Psychiatric:        Mood and Affect: Mood normal.        Behavior: Behavior normal.        Thought Content: Thought content normal.        Judgment: Judgment normal.      LABORATORY DATA:  I have reviewed the labs as listed.  CBC    Component Value Date/Time   WBC 5.5 02/05/2020 1040   RBC 4.09 02/05/2020 1040   HGB 13.4 02/05/2020  1040   HGB 13.0 01/01/2019 1425   HCT 40.8 02/05/2020 1040   HCT 38.2 01/01/2019 1425   PLT 91 (L) 02/05/2020 1040   PLT 103 (L) 01/01/2019 1425   MCV 99.8 02/05/2020 1040   MCV 93 01/01/2019 1425   MCH 32.8 02/05/2020 1040   MCHC 32.8 02/05/2020 1040   RDW 13.4 02/05/2020 1040   RDW 12.7 01/01/2019 1425   LYMPHSABS 2.2 02/05/2020 1040   MONOABS 0.6 02/05/2020 1040   EOSABS 0.1 02/05/2020 1040   BASOSABS 0.0 02/05/2020 1040   CMP Latest Ref Rng & Units 11/25/2019 10/04/2019 08/01/2019  Glucose 70 - 99 mg/dL 123(H) 175(H) 125(H)  BUN 6 - 23 mg/dL 22 21 19   Creatinine 0.40 - 1.20 mg/dL 1.04 0.84 0.84  Sodium 135 - 145 mEq/L 142 141 143  Potassium 3.5 - 5.1 mEq/L 4.2 4.1 3.6  Chloride 96 - 112 mEq/L 103 104 106  CO2 19 - 32 mEq/L 31 27 31   Calcium 8.4 - 10.5 mg/dL 9.3 9.0 8.8  Total Protein 6.0 - 8.3 g/dL 6.0 5.8(L) 5.6(L)  Total Bilirubin 0.2 - 1.2 mg/dL 2.9(H) 2.1(H) 2.4(H)  Alkaline Phos 39 - 117 U/L 95 92 93  AST 0 - 37 U/L 51(H) 54(H) 76(H)  ALT 0 - 35 U/L 33 37 48(H)    All questions were answered to patient's stated satisfaction. Encouraged patient to call with any new concerns or questions before his next visit to the cancer center and we can certain see him sooner, if needed.     ASSESSMENT & PLAN:  Thrombocytopenia ([place]) 1.Thrombocytopenia: -Initially platelets were 83.  -Likely due from cirrhosis and splenomegaly based on her last CT scan. -CT of abdomen and pelvis on 06/27/2018 showed cirrhosis. 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 There is a 2 mm nodule in the right middle lobe. -Labs on 02/05/2020 showed platelet count of 91. -She will follow up in 3 months with labs.  2.Cirrhosis: -CT of abdomen and pelvis on 06/27/2018 showed cirrhosis. -She is being followed by GI for this.  She was placed on rifaximin and lactulose.  3.Joint pain: -she had a full work up including SPEP, CRP, ANA, RF which  all were negative. -She did have an elevated sed rate of 37. Patient does have a history of fibromyalgia. -she follows up with her PCP.  4.Elevated Bilirubin: -Labs showed elevated bilirubin dating back to 2017. -CT of abdomen on 06/27/2018 showed cirrhosis, splenomegaly, and evidence of cholecystectomy. -Labs on 06/28/2019 showed bilirubin 3.8. -Labs on 11/25/2019 showed bilirubin 2.9 -She is being followed closely by GI and is on lactulose.  5.Pulmonary nodule: -CT scan done on 06/27/2018 showed a 2 mm right middle lobe nodule.  -CXR was done on 06/27/2018 and showed no acute process.  - Patient denies smoking. -CT of chest  done on, compatible with infectious/inflammatory allergy.  Given the history of recent Covid infection this is likely reflex viral pneumonia.  Also showed mild mediastinal lymphadenopathy, presumably reactive.  Repeat CT recommended. -CT of chest repeated on 10/04/2019 which showed resolution of COVID-19 pneumonia.  No acute process in the chest.  Bilateral pulmonary nodules of maximally 4 mm.  No follow-up needed CT of chest repeat in 12 months. -We will repeat a CT of the chest February 2022  6. Health maintenance: -Mammogram done on 06/03/2019 was B-RADS category 1 negative. -Last colonoscopy on 12/06/2016 showed several polyps that were negative for malignancy. -Patient has a family history of colon cancer and has ungone genetic testing which was negative.        Orders placed this encounter:  Orders Placed This Encounter  Procedures  . Lactate dehydrogenase  . CBC with Differential/Platelet  . Comprehensive metabolic panel  . Vitamin B12  . VITAMIN D 25 Hydroxy (Vit-D Deficiency, Fractures)      Francene Finders, FNP-C [place] [phone]

## 2020-02-12 NOTE — Patient Instructions (Signed)
[place] at [facility] Discharge Instructions  Follow up in 3 months with lab s   Thank you for choosing [place] at [facility] to provide your oncology and hematology care.  To afford each patient quality time with our provider, please arrive at least 15 minutes before your scheduled appointment time.   If you have a lab appointment with the [facility] please come in thru the Main Entrance and check in at the main information desk.  You need to re-schedule your appointment should you arrive 10 or more minutes late.  We strive to give you quality time with our providers, and arriving late affects you and other patients whose appointments are after yours.  Also, if you no show three or more times for appointments you may be dismissed from the clinic at the providers discretion.     Again, thank you for choosing [facility].  Our hope is that these requests will decrease the amount of time that you wait before being seen by our physicians.       _____________________________________________________________  Should you have questions after your visit to [facility], please contact our office at [phone] between the hours of 8:00 a.m. and 4:30 p.m.  Voicemails left after 4:00 p.m. will not be returned until the following business day.  For prescription refill requests, have your pharmacy contact our office and allow 72 hours.    Due to Covid, you will need to wear a mask upon entering the hospital. If you do not have a mask, a mask will be given to you at the Main Entrance upon arrival. For doctor visits, patients may have 1 support person with them. For treatment visits, patients can not have anyone with them due to social distancing guidelines and our immunocompromised population.

## 2020-02-13 ENCOUNTER — Telehealth: Payer: Self-pay | Admitting: Family Medicine

## 2020-02-13 DIAGNOSIS — E119 Type 2 diabetes mellitus without complications: Secondary | ICD-10-CM

## 2020-02-13 MED ORDER — METFORMIN HCL 500 MG PO TABS: 500.0000 mg | ORAL_TABLET | Freq: Two times a day (BID) | ORAL | 0 refills | Status: DC

## 2020-02-13 NOTE — Telephone Encounter (Signed)
Refill sent.  No further refills will be sent

## 2020-02-13 NOTE — Telephone Encounter (Signed)
  Prescription Request  02/13/2020  What is the name of the medication or equipment? metFORMIN (GLUCOPHAGE) 500 MG tablet  Have you contacted your pharmacy to request a refill? (if applicable) no but pt knew she ntbs before refill and till now decide to call for an apt. [place] not available till 03/18/2020. Pt scheduled for 03/18/2020 at 2:00  Which pharmacy would you like this sent to? [facility]    Patient notified that their request is being sent to the clinical staff for review and that they should receive a response within 2 business days.

## 2020-02-20 ENCOUNTER — Other Ambulatory Visit: Payer: Self-pay | Admitting: *Deleted

## 2020-02-20 DIAGNOSIS — E119 Type 2 diabetes mellitus without complications: Secondary | ICD-10-CM

## 2020-02-20 MED ORDER — METFORMIN HCL 500 MG PO TABS: 500.0000 mg | ORAL_TABLET | Freq: Two times a day (BID) | ORAL | 0 refills | Status: DC

## 2020-02-20 NOTE — Telephone Encounter (Signed)
Appt 03/18/20

## 2020-02-20 NOTE — Addendum Note (Signed)
Addended by: Antonietta Barcelona D on: 02/20/2020 01:57 PM   Modules accepted: Orders

## 2020-02-24 ENCOUNTER — Other Ambulatory Visit: Payer: Self-pay | Admitting: Family Medicine

## 2020-02-24 DIAGNOSIS — R600 Localized edema: Secondary | ICD-10-CM

## 2020-03-18 ENCOUNTER — Encounter: Payer: Self-pay | Admitting: Family Medicine

## 2020-03-18 ENCOUNTER — Ambulatory Visit (INDEPENDENT_AMBULATORY_CARE_PROVIDER_SITE_OTHER): Payer: PPO | Admitting: Family Medicine

## 2020-03-18 ENCOUNTER — Other Ambulatory Visit: Payer: Self-pay

## 2020-03-18 VITALS — BP 131/67 | HR 60 | Temp 97.6°F | Ht 62.0 in | Wt 223.6 lb

## 2020-03-18 DIAGNOSIS — E119 Type 2 diabetes mellitus without complications: Secondary | ICD-10-CM

## 2020-03-18 DIAGNOSIS — F411 Generalized anxiety disorder: Secondary | ICD-10-CM

## 2020-03-18 DIAGNOSIS — E11620 Type 2 diabetes mellitus with diabetic dermatitis: Secondary | ICD-10-CM

## 2020-03-18 DIAGNOSIS — Z79899 Other long term (current) drug therapy: Secondary | ICD-10-CM

## 2020-03-18 DIAGNOSIS — E785 Hyperlipidemia, unspecified: Secondary | ICD-10-CM | POA: Diagnosis not present

## 2020-03-18 DIAGNOSIS — D696 Thrombocytopenia, unspecified: Secondary | ICD-10-CM

## 2020-03-18 DIAGNOSIS — M5442 Lumbago with sciatica, left side: Secondary | ICD-10-CM | POA: Diagnosis not present

## 2020-03-18 DIAGNOSIS — G8929 Other chronic pain: Secondary | ICD-10-CM

## 2020-03-18 DIAGNOSIS — E1169 Type 2 diabetes mellitus with other specified complication: Secondary | ICD-10-CM | POA: Diagnosis not present

## 2020-03-18 DIAGNOSIS — I10 Essential (primary) hypertension: Secondary | ICD-10-CM | POA: Diagnosis not present

## 2020-03-18 DIAGNOSIS — E1159 Type 2 diabetes mellitus with other circulatory complications: Secondary | ICD-10-CM | POA: Diagnosis not present

## 2020-03-18 LAB — BAYER DCA HB A1C WAIVED: HB A1C (BAYER DCA - WAIVED): 6.3 % (ref <7.0)

## 2020-03-18 MED ORDER — METFORMIN HCL 500 MG PO TABS: 500.0000 mg | ORAL_TABLET | Freq: Two times a day (BID) | ORAL | 3 refills | Status: DC

## 2020-03-18 MED ORDER — ATORVASTATIN CALCIUM 40 MG PO TABS: 40.0000 mg | ORAL_TABLET | Freq: Every day | ORAL | 3 refills | Status: DC

## 2020-03-18 MED ORDER — TRAMADOL HCL 50 MG PO TABS: 50.0000 mg | ORAL_TABLET | Freq: Two times a day (BID) | ORAL | 1 refills | Status: DC | PRN

## 2020-03-18 MED ORDER — BENAZEPRIL HCL 40 MG PO TABS: 40.0000 mg | ORAL_TABLET | Freq: Every day | ORAL | 3 refills | Status: DC

## 2020-03-18 MED ORDER — ALPRAZOLAM 0.25 MG PO TABS: 0.2500 mg | ORAL_TABLET | Freq: Two times a day (BID) | ORAL | 1 refills | Status: DC | PRN

## 2020-03-18 NOTE — Progress Notes (Signed)
Subjective: CC: Follow-up DM PCP: Janora Norlander, DO Stacey Summers is a 74 y.o. female presenting to clinic today for:  1. Type 2 Diabetes with hypertension, hyperlipidemia:  Patient reports compliance with her medications.  No chest pain, shortness of breath, heart palpitations, falls.  She has rare dizziness that is relieved by meclizine  Last eye exam: Needs Last foot exam: Needs Last A1c:  Lab Results  Component Value Date   HGBA1C 6.2 04/02/2019   Nephropathy screen indicated?:  On ACE inhibitor Last flu, zoster and/or pneumovax:  Immunization History  Administered Date(s) Administered  . Hepb-cpg 07/19/2018, 08/20/2018  . Influenza, High Dose Seasonal PF 08/03/2018  . Influenza,inj,Quad PF,6+ Mos 05/28/2014, 06/16/2015, 06/10/2016  . Influenza,inj,quad, With Preservative 05/15/2017, 08/03/2018  . Influenza-Unspecified 04/24/2017  . Pneumococcal Conjugate-13 07/21/2014  . Pneumococcal Polysaccharide-23 09/21/2015  . Tdap 07/07/2009  . Zoster Recombinat (Shingrix) 04/24/2017, 07/11/2017    2.  Generalized anxiety disorder Patient reports rare use of the alprazolam.  She denies any excessive daytime sedation, falls, changes in mental status including memory loss.  No visual auditory hallucinations.  No changes in breathing.  3.  Chronic low back pain Patient with ongoing chronic low back pain.  She has occasional numbness and tingling in the left lower extremity.  This seems to resolve with position changes.  She uses a cane for ambulation brings to me a handicap placard form to complete today.  She has been using the tramadol a little bit more than usual lately just because her leg and hip are hurting her more.  She denies any constipation, sedation, intolerance to the medication.   ROS: Per HPI  Allergies  Allergen Reactions  . Latex     "my hands feels numb"   Past Medical History:  Diagnosis Date  . Allergy    seasonal  . Anxiety   . Arthritis     hands, back   . Cataract    surgery to remove  . Chronic back pain    lower back   . Cirrhosis of liver without ascites ([place])    recent dx 07/11/18  . COPD (chronic obstructive pulmonary disease) (HCC)    no inhaler  . Diabetes mellitus without complication ([place])   . DJD (degenerative joint disease)   . Fibromyalgia   . Genetic testing 03/08/2017   Ms. Wilhoite underwent genetic counseling and testing for hereditary cancer syndromes on 02/21/2017. Her results were negative for mutations in all 83 genes analyzed by [facility]'s 83-gene Multi-Cancers Panel. Genes analyzed include: ALK, APC, ATM, AXIN2, BAP1, BARD1, BLM, BMPR1A, BRCA1, BRCA2, BRIP1, CASR, CDC73, CDH1, CDK4, CDKN1B, CDKN1C, CDKN2A, CEBPA, CHEK2, CTNNA1, DICER1, DIS3L2, EGFR, EPCAM  . GERD (gastroesophageal reflux disease)   . 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 History of abnormal mammogram   . Hyperlipidemia   . Hypertension   . Irregular heart beat   . Neuromuscular disorder (HCC)    fibromyalgia  . Sleep apnea    cpap some  . SVD (spontaneous vaginal delivery)    x 2  . Tubular adenoma of colon 05/09/12   "innumerable number" of flat polyps    Current Outpatient Medications:  .  ALPRAZolam (XANAX) 0.25 MG tablet, Take 1 tablet (0.25 mg total) by mouth 2 (two) times daily as needed for anxiety., Disp: 30 tablet, Rfl: 1 .  atorvastatin (LIPITOR) 40 MG tablet, Take 1 tablet by mouth once daily, Disp: 90 tablet, Rfl: 0 .  benazepril (  LOTENSIN) 40 MG tablet, Take 1 tablet by mouth once daily, Disp: 90 tablet, Rfl: 0 .  blood glucose meter kit and supplies KIT, Dispense based on patient and insurance preference. Use up to four times daily as directed. (FOR ICD-9 250.00, 250.01)., Disp: 1 each, Rfl: 10 .  Blood Glucose Monitoring Suppl (LIBERTY BLOOD GLUCOSE MONITOR) DEVI, 1 each by Does not apply route daily. Test 1x per day and prn-dx250.02, Disp: 1 each, Rfl: 0 .  Cholecalciferol (VITAMIN D3) 5000 units  CAPS, Take 5,000 Units by mouth daily., Disp: , Rfl:  .  cyclobenzaprine (FLEXERIL) 5 MG tablet, TAKE 1 TABLET BY MOUTH THREE TIMES DAILY AS NEEDED FOR MUSCLE SPASMS (NEEDS APPOINTMENT), Disp: 30 tablet, Rfl: 0 .  diltiazem (CARDIZEM CD) 120 MG 24 hr capsule, Take 1 capsule by mouth once daily, Disp: 90 capsule, Rfl: 2 .  fluticasone (FLONASE) 50 MCG/ACT nasal spray, Place 1 spray into both nostrils in the morning and at bedtime., Disp: 16 g, Rfl: 6 .  furosemide (LASIX) 40 MG tablet, TAKE 1 TABLET BY MOUTH IN THE MORNING AND 1/2 (ONE-HALF) TABLET AT 1 PM, Disp: 135 tablet, Rfl: 0 .  glucose blood test strip, Test 1x per day and prn- dx 250.02, Disp: 100 each, Rfl: 2 .  lactulose (CHRONULAC) 10 GM/15ML solution, TAKE 15 ML BY MOUTH  THREE TIMES DAILY, Disp: 473 mL, Rfl: 0 .  meclizine (ANTIVERT) 25 MG tablet, Take 1 tablet (25 mg total) by mouth 3 (three) times daily as needed for dizziness., Disp: 30 tablet, Rfl: 0 .  metFORMIN (GLUCOPHAGE) 500 MG tablet, Take 1 tablet (500 mg total) by mouth 2 (two) times daily with a meal., Disp: 180 tablet, Rfl: 0 .  ondansetron (ZOFRAN) 4 MG tablet, Take 1 tablet (4 mg total) by mouth every 8 (eight) hours as needed for nausea or vomiting., Disp: 30 tablet, Rfl: 1 .  ONE TOUCH ULTRA TEST test strip, USE ONE STRIP TO CHECK GLUCOSE ONCE DAILY, Disp: 100 each, Rfl: 1 .  ONETOUCH DELICA LANCETS 91Y MISC, USE ONE LANCET TO CHECK GLUCOSE ONCE DAILY AND AS NEEDED, Disp: 100 each, Rfl: 0 .  rifaximin (XIFAXAN) 550 MG TABS tablet, Take 1 tablet (550 mg total) by mouth 2 (two) times daily., Disp: 60 tablet, Rfl: 3 .  traMADol (ULTRAM) 50 MG tablet, Take 1 tablet (50 mg total) by mouth every 12 (twelve) hours as needed for moderate pain., Disp: 30 tablet, Rfl: 1 Social History   Socioeconomic History  . Marital status: Married    Spouse name: [name]  . Number of children: 3  . Years of education: 32  . Highest education level: GED or equivalent  Occupational History    . Occupation: retired  Tobacco Use  . Smoking status: Former Smoker    Packs/day: 1.00    Years: 3.00    Pack years: 3.00    Types: Cigarettes    Quit date: 08/15/1969    Years since quitting: 50.6  . Smokeless tobacco: Never Used  Vaping Use  . Vaping Use: Never used  Substance and Sexual Activity  . Alcohol use: No    Alcohol/week: 0.0 standard drinks  . Drug use: No  . Sexual activity: Not Currently  Other Topics Concern  . Not on file  Social History Narrative  . Not on file   Social Determinants of Health   Financial Resource Strain: Low Risk   . Difficulty of Paying Living Expenses: Not hard at all  Food Insecurity:  No Food Insecurity  . Worried About Charity fundraiser in the Last Year: Never true  . Ran Out of Food in the Last Year: Never true  Transportation Needs: No Transportation Needs  . Lack of Transportation (Medical): No  . Lack of Transportation (Non-Medical): No  Physical Activity: Inactive  . Days of Exercise per Week: 0 days  . Minutes of Exercise per Session: 0 min  Stress: No Stress Concern Present  . Feeling of Stress : Only a little  Social Connections: Socially Integrated  . Frequency of Communication with Friends and Family: More than three times a week  . Frequency of Social Gatherings with Friends and Family: More than three times a week  . Attends Religious Services: More than 4 times per year  . Active Member of Clubs or Organizations: Yes  . Attends Archivist Meetings: More than 4 times per year  . Marital Status: Married  Human resources officer Violence: Not At Risk  . Fear of Current or Ex-Partner: No  . Emotionally Abused: No  . Physically Abused: No  . Sexually Abused: No   Family History  Problem Relation Age of Onset  . Prostate cancer Father        d.89 diagnosed in late-60s  . Colon cancer Father        diagnosed at 81  . Heart failure Mother        d.37  . Heart disease Mother   . Colon polyps Brother         polyposis. Underwent partial colectomy at age 66.  Marland Kitchen Spina bifida Brother        died at birth  . Stomach cancer Paternal Aunt        questionable  . Kidney cancer Cousin        d.78s maternal first-cousin  . Other Other 16       multiple lipomas  . Colon cancer Paternal Uncle        in his 26s  . Heart failure Maternal Grandmother        d.45  . Heart failure Maternal Uncle        d.50  . Colon polyps Son 9  . Heart attack Son 72  . Leukemia Maternal Aunt   . Esophageal cancer Neg Hx   . Rectal cancer Neg Hx     Objective: Office vital signs reviewed. BP 131/67   Pulse 60   Temp 97.6 F (36.4 C)   Ht 5' 2"  (1.575 m)   Wt 223 lb 9.6 oz (101.4 kg)   SpO2 98%   BMI 40.90 kg/m   Physical Examination:  General: Awake, alert, chronically ill appearing female, No acute distress Cardio: regular rate and rhythm, S1S2 heard, no murmurs appreciated Pulm: clear to auscultation bilaterally, no wheezes, rhonchi or rales; normal work of breathing on room air Extremities: warm, well perfused, No edema, cyanosis or clubbing; +2 pulses bilaterally MSK: Antalgic gait and station Skin: Patchy areas noted along the left foot consistent with dermatitis Neuro: See diabetic foot  Diabetic Foot Exam - Simple   Simple Foot Form Diabetic Foot exam was performed with the following findings: Yes 03/18/2020  2:16 PM  Visual Inspection See comments: Yes Sensation Testing Intact to touch and monofilament testing bilaterally: Yes Pulse Check Posterior Tibialis and Dorsalis pulse intact bilaterally: Yes Comments Hammertoe noted in bilateral feet.  She has onychomycotic changes bilaterally.  There is dermatitis in the left foot.  No ulcerations or calluses appreciated  Assessment/ Plan: 74 y.o. female   1. Type 2 diabetes mellitus with diabetic dermatitis, without long-term current use of insulin (HCC) Under excellent control with A1c of 6.3 today.  No changes.  Continue current  treatment - Bayer DCA Hb A1c Waived  2. Hypertension associated with diabetes ([place]) Controlled.  Continue current regimen - benazepril (LOTENSIN) 40 MG tablet; Take 1 tablet (40 mg total) by mouth daily.  Dispense: 90 tablet; Refill: 3  3. Hyperlipidemia associated with type 2 diabetes mellitus ([place]) Check liver function panel given slight rise in LFTs last visit - Hepatic Function Panel - atorvastatin (LIPITOR) 40 MG tablet; Take 1 tablet (40 mg total) by mouth daily.  Dispense: 90 tablet; Refill: 3  4. GAD (generalized anxiety disorder) National narcotic database was reviewed and there were no red flags.  Patient with very sparing use of the alprazolam. - ToxASSURE Select 13 (MW), Urine - ALPRAZolam (XANAX) 0.25 MG tablet; Take 1 tablet (0.25 mg total) by mouth 2 (two) times daily as needed for anxiety.  Dispense: 30 tablet; Refill: 1  5. Chronic left-sided low back pain with left-sided sciatica Stable. - ToxASSURE Select 13 (MW), Urine - traMADol (ULTRAM) 50 MG tablet; Take 1 tablet (50 mg total) by mouth every 12 (twelve) hours as needed for moderate pain.  Dispense: 30 tablet; Refill: 1  6. Controlled substance agreement signed - ToxASSURE Select 13 (MW), Urine  7. Thrombocytopenia ([place]) CC lab results to [place] - CBC with Differential   Orders Placed This Encounter  Procedures  . Bayer DCA Hb A1c Waived  . ToxASSURE Select 13 (MW), Urine  . CBC with Differential    Order Specific Question:   CC Results    Answer:   Stacey Summers [id number]  . Hepatic Function Panel   No orders of the defined types were placed in this encounter.    Janora Norlander, DO [place] [phone]

## 2020-03-18 NOTE — Patient Instructions (Signed)

## 2020-03-19 ENCOUNTER — Other Ambulatory Visit: Payer: Self-pay | Admitting: Gastroenterology

## 2020-03-19 LAB — CBC WITH DIFFERENTIAL/PLATELET
Basophils Absolute: 0 10*3/uL (ref 0.0–0.2)
Basos: 1 %
EOS (ABSOLUTE): 0.2 10*3/uL (ref 0.0–0.4)
Eos: 3 %
Hematocrit: 38.1 % (ref 34.0–46.6)
Hemoglobin: 12.4 g/dL (ref 11.1–15.9)
Immature Grans (Abs): 0 10*3/uL (ref 0.0–0.1)
Immature Granulocytes: 0 %
Lymphocytes Absolute: 1.9 10*3/uL (ref 0.7–3.1)
Lymphs: 33 %
MCH: 31.3 pg (ref 26.6–33.0)
MCHC: 32.5 g/dL (ref 31.5–35.7)
MCV: 96 fL (ref 79–97)
Monocytes Absolute: 0.6 10*3/uL (ref 0.1–0.9)
Monocytes: 11 %
Neutrophils Absolute: 3 10*3/uL (ref 1.4–7.0)
Neutrophils: 52 %
Platelets: 82 10*3/uL — CL (ref 150–450)
RBC: 3.96 x10E6/uL (ref 3.77–5.28)
RDW: 13 % (ref 11.7–15.4)
WBC: 5.7 10*3/uL (ref 3.4–10.8)

## 2020-03-19 LAB — HEPATIC FUNCTION PANEL
ALT: 43 IU/L — ABNORMAL HIGH (ref 0–32)
AST: 64 IU/L — ABNORMAL HIGH (ref 0–40)
Albumin: 3.5 g/dL — ABNORMAL LOW (ref 3.7–4.7)
Alkaline Phosphatase: 113 IU/L (ref 48–121)
Bilirubin Total: 2.4 mg/dL — ABNORMAL HIGH (ref 0.0–1.2)
Bilirubin, Direct: 0.48 mg/dL — ABNORMAL HIGH (ref 0.00–0.40)
Total Protein: 5.6 g/dL — ABNORMAL LOW (ref 6.0–8.5)

## 2020-03-20 ENCOUNTER — Telehealth: Payer: Self-pay | Admitting: Gastroenterology

## 2020-03-20 LAB — TOXASSURE SELECT 13 (MW), URINE

## 2020-03-20 NOTE — Telephone Encounter (Signed)
Pt states that she had blood test with her PCP that showed her platelets abnormal. She would like for Dr. Silverio Decamp to review those labs ( available in [facility]) and let her know her opinion or if she would like pt to repeat them. Pls call her

## 2020-03-20 NOTE — Telephone Encounter (Signed)
Spoke with the patient. She can see her labs that were drawn today. She is concerned about the steady decline of her platelet count. She has an appointment at the office of the ordering physician next week. Concerned today. I talked with her a moment. She is aware that the ordering provider will advise on thoughts and recommendations regarding the results. I will show the lab to Dr Silverio Decamp when she returns next week as requested.

## 2020-03-23 ENCOUNTER — Telehealth: Payer: Self-pay | Admitting: Gastroenterology

## 2020-03-23 NOTE — Telephone Encounter (Signed)
Spoke with the daughter. She is advised the labs drawn by the PCP have been, per PCP note, forwarded to Hem/Onc. Also, we have scheduled the follow up appointment. She is aware that Dr Silverio Decamp will see the lab results.

## 2020-03-25 ENCOUNTER — Telehealth: Payer: Self-pay | Admitting: Gastroenterology

## 2020-03-25 ENCOUNTER — Telehealth: Payer: Self-pay | Admitting: Family Medicine

## 2020-03-25 NOTE — Telephone Encounter (Signed)
The patient is asking if Dr Stacey Summers can do anything for her low platelet count. She and her daughter are concerned because it is lower than before.

## 2020-03-25 NOTE — Telephone Encounter (Signed)
Pt called very upset over platelet numbers. She is concerned with the decrease in platelets. She saw this on my chart and says no one has called her. I made sure it was sent to [facility].

## 2020-03-25 NOTE — Telephone Encounter (Signed)
Not sure how I can help patient.  It was released to MyChart with explaination, sent to pools AND cc'd to her Hematologist. Her AVS states that labs will be released to Mychart unless she asks the staff to call her specifically.  I'm sure that if she wanted a call over MyChart, staff would have been happy to oblige.  Is there any way that she can adjust her MyChart settings so that she HAS to be called for ALL labs?

## 2020-03-25 NOTE — Telephone Encounter (Signed)
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 There is no medication to reverse it.

## 2020-03-26 ENCOUNTER — Telehealth: Payer: Self-pay | Admitting: Family Medicine

## 2020-03-26 NOTE — Telephone Encounter (Signed)
Left message to call back  

## 2020-03-26 NOTE — Telephone Encounter (Signed)
Spoke with the daughter [name]. She will help her mother understand that this is a part of the natural progression of cirrhosis. She will hopefully be allowed to accompany her mother to the October appointment.

## 2020-03-30 ENCOUNTER — Other Ambulatory Visit: Payer: Self-pay

## 2020-03-30 ENCOUNTER — Other Ambulatory Visit: Payer: Self-pay | Admitting: Gastroenterology

## 2020-03-30 NOTE — Telephone Encounter (Signed)
Patients daughter calling for refill on Lactulose

## 2020-04-01 ENCOUNTER — Other Ambulatory Visit: Payer: Self-pay | Admitting: Family Medicine

## 2020-04-01 DIAGNOSIS — E1159 Type 2 diabetes mellitus with other circulatory complications: Secondary | ICD-10-CM

## 2020-04-07 NOTE — Telephone Encounter (Signed)
Tried to call patient again,  Unable to reach, encounter closed

## 2020-04-08 ENCOUNTER — Other Ambulatory Visit: Payer: Self-pay | Admitting: Gastroenterology

## 2020-04-19 ENCOUNTER — Other Ambulatory Visit: Payer: Self-pay | Admitting: Gastroenterology

## 2020-05-01 ENCOUNTER — Other Ambulatory Visit: Payer: Self-pay | Admitting: Gastroenterology

## 2020-05-11 NOTE — Chronic Care Management (AMB) (Signed)
Erroneous encounter

## 2020-05-15 ENCOUNTER — Other Ambulatory Visit: Payer: Self-pay | Admitting: Gastroenterology

## 2020-05-18 ENCOUNTER — Encounter: Payer: Self-pay | Admitting: Gastroenterology

## 2020-05-18 ENCOUNTER — Ambulatory Visit (INDEPENDENT_AMBULATORY_CARE_PROVIDER_SITE_OTHER): Payer: PPO | Admitting: Gastroenterology

## 2020-05-18 ENCOUNTER — Other Ambulatory Visit (INDEPENDENT_AMBULATORY_CARE_PROVIDER_SITE_OTHER): Payer: PPO

## 2020-05-18 VITALS — BP 130/64 | HR 56 | Ht 62.0 in | Wt 221.8 lb

## 2020-05-18 DIAGNOSIS — K7581 Nonalcoholic steatohepatitis (NASH): Secondary | ICD-10-CM

## 2020-05-18 DIAGNOSIS — K7682 Hepatic encephalopathy: Secondary | ICD-10-CM

## 2020-05-18 DIAGNOSIS — K7460 Unspecified cirrhosis of liver: Secondary | ICD-10-CM | POA: Diagnosis not present

## 2020-05-18 DIAGNOSIS — D696 Thrombocytopenia, unspecified: Secondary | ICD-10-CM | POA: Diagnosis not present

## 2020-05-18 DIAGNOSIS — K7290 Hepatic failure, unspecified without coma: Secondary | ICD-10-CM

## 2020-05-18 LAB — COMPREHENSIVE METABOLIC PANEL
ALT: 33 U/L (ref 0–35)
AST: 50 U/L — ABNORMAL HIGH (ref 0–37)
Albumin: 3.4 g/dL — ABNORMAL LOW (ref 3.5–5.2)
Alkaline Phosphatase: 94 U/L (ref 39–117)
BUN: 16 mg/dL (ref 6–23)
CO2: 32 mEq/L (ref 19–32)
Calcium: 9 mg/dL (ref 8.4–10.5)
Chloride: 104 mEq/L (ref 96–112)
Creatinine, Ser: 0.91 mg/dL (ref 0.40–1.20)
GFR: 60.74 mL/min (ref >60.00)
Glucose, Bld: 133 mg/dL — ABNORMAL HIGH (ref 70–99)
Potassium: 3.9 mEq/L (ref 3.5–5.1)
Sodium: 142 mEq/L (ref 135–145)
Total Bilirubin: 3.6 mg/dL — ABNORMAL HIGH (ref 0.2–1.2)
Total Protein: 5.8 g/dL — ABNORMAL LOW (ref 6.0–8.3)

## 2020-05-18 LAB — CBC WITH DIFFERENTIAL/PLATELET
Basophils Absolute: 0 10*3/uL (ref 0.0–0.1)
Basophils Relative: 0.6 % (ref 0.0–3.0)
Eosinophils Absolute: 0.1 10*3/uL (ref 0.0–0.7)
Eosinophils Relative: 2.4 % (ref 0.0–5.0)
HCT: 40.6 % (ref 36.0–46.0)
Hemoglobin: 13.7 g/dL (ref 12.0–15.0)
Lymphocytes Relative: 36.6 % (ref 12.0–46.0)
Lymphs Abs: 2 10*3/uL (ref 0.7–4.0)
MCHC: 33.7 g/dL (ref 30.0–36.0)
MCV: 97.8 fl (ref 78.0–100.0)
Monocytes Absolute: 0.5 10*3/uL (ref 0.1–1.0)
Monocytes Relative: 9.2 % (ref 3.0–12.0)
Neutro Abs: 2.8 10*3/uL (ref 1.4–7.7)
Neutrophils Relative %: 51.2 % (ref 43.0–77.0)
Platelets: 73 10*3/uL — ABNORMAL LOW (ref 150.0–400.0)
RBC: 4.15 Mil/uL (ref 3.87–5.11)
RDW: 14.3 % (ref 11.5–15.5)
WBC: 5.4 10*3/uL (ref 4.0–10.5)

## 2020-05-18 LAB — PROTIME-INR
INR: 1.4 ratio — ABNORMAL HIGH (ref 0.8–1.0)
Prothrombin Time: 15.1 s — ABNORMAL HIGH (ref 9.6–13.1)

## 2020-05-18 MED ORDER — LACTULOSE 10 GM/15ML PO SOLN: ORAL | 11 refills | Status: DC

## 2020-05-18 MED ORDER — RIFAXIMIN 550 MG PO TABS: 550.0000 mg | ORAL_TABLET | Freq: Two times a day (BID) | ORAL | 3 refills | Status: DC

## 2020-05-18 NOTE — Progress Notes (Addendum)
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  Is doing somewhat better, denies any worsening confusion, abdominal distention, melena or rectal bleeding. No vomiting, abdominal bloating or weight gain.  Intermittent lower extremity edema and leg pain and cramping  Continues to have fatigue, and is frustrated that she is not able to do what she was doing before.  History of COVID-19 infection November 2020.  Worsening mental status with confusion and somnolence post Covid.  She has prominent splenorenal shunt, was referred to IR to discuss possible embolization.  But given she has significant improvement of symptoms with Xifaxan twice daily, family and patient preferred to hold off the embolization.  She continues to have intermittent episodes of confusion, sleeps during daytime and sometimes has difficulty sleeping at night per her family. She is taking  rifaximin twice daily and lactulose up to 3 times daily.        Outpatient Encounter Medications as of 05/18/2020  Medication Sig  . ALPRAZolam (XANAX) 0.25 MG tablet Take 1 tablet (0.25 mg total) by mouth 2 (two) times daily as needed for anxiety.  Marland Kitchen atorvastatin (LIPITOR) 40 MG tablet Take 1 tablet (40 mg total) by mouth daily.  . benazepril (LOTENSIN) 40 MG tablet Take 1 tablet by mouth once daily  . blood glucose meter kit and supplies KIT Dispense based on patient and insurance preference. Use up to four times daily as directed. (FOR ICD-9 250.00, 250.01).  . Blood Glucose Monitoring Suppl (LIBERTY BLOOD GLUCOSE MONITOR) DEVI 1 each by Does not apply route daily. Test 1x per day and  prn-dx250.02  . Cholecalciferol (VITAMIN D3) 5000 units CAPS Take 5,000 Units by mouth daily.  . cyclobenzaprine (FLEXERIL) 5 MG tablet TAKE 1 TABLET BY MOUTH THREE TIMES DAILY AS NEEDED FOR MUSCLE SPASMS (NEEDS APPOINTMENT)  . diltiazem (CARDIZEM CD) 120 MG 24 hr capsule Take 1 capsule by mouth once daily  . fluticasone (FLONASE) 50 MCG/ACT nasal spray Place 1 spray into both nostrils in the morning and at bedtime.  . furosemide (LASIX) 40 MG tablet TAKE 1 TABLET BY MOUTH IN THE MORNING AND 1/2 (ONE-HALF) TABLET AT 1 PM  . glucose blood test strip Test 1x per day and prn- dx 250.02  . lactulose (CHRONULAC) 10 GM/15ML solution TAKE 15 ML BY MOUTH  THREE TIMES DAILY  . meclizine (ANTIVERT) 25 MG tablet Take 1 tablet (25 mg total) by mouth 3 (three) times daily as needed for dizziness.  . metFORMIN (GLUCOPHAGE) 500 MG tablet Take 1 tablet (500 mg total) by mouth 2 (two) times daily with a meal.  . ondansetron (ZOFRAN) 4 MG tablet Take 1 tablet (4 mg total) by mouth every 8 (eight) hours as needed for nausea or vomiting.  . ONE TOUCH ULTRA TEST test strip USE ONE STRIP TO CHECK GLUCOSE ONCE DAILY  . ONETOUCH DELICA LANCETS 00L MISC USE ONE LANCET TO CHECK GLUCOSE ONCE DAILY AND AS NEEDED  . rifaximin (XIFAXAN) 550 MG TABS tablet Take 1 tablet (550 mg total) by mouth 2 (two) times daily.  . traMADol (ULTRAM) 50 MG tablet Take 1 tablet (50 mg total) by mouth  every 12 (twelve) hours as needed for moderate pain.   No facility-administered encounter medications on file as of 05/18/2020.    Allergies as of 05/18/2020 - Review Complete 05/18/2020  Allergen Reaction Noted  . Latex  03/27/2018    Past Medical History:  Diagnosis Date  . Allergy    seasonal  . Anxiety   . Arthritis    hands, back   . Cataract    surgery to remove  . Chronic back pain    lower back   . Cirrhosis of liver without ascites ([facility])    recent dx 07/11/18  . COPD (chronic obstructive pulmonary disease) (H[facility])    no  inhaler  . Diabetes mellitus without complication ([place])   . DJD (degenerative joint disease)   . Fibromyalgia   . Genetic testing 03/08/2017   Ms. Altadonna underwent genetic counseling and testing for hereditary cancer syndromes on 02/21/2017. Her results were negative for mutations in all 83 genes analyzed by [facility]'s 83-gene Multi-Cancers Panel. Genes analyzed include: ALK, APC, ATM, AXIN2, BAP1, BARD1, BLM, BMPR1A, BRCA1, BRCA2, BRIP1, CASR, CDC73, CDH1, CDK4, CDKN1B, CDKN1C, CDKN2A, CEBPA, CHEK2, CTNNA1, DICER1, DIS3L2, EGFR, EPCAM  . GERD (gastroesophageal reflux disease)   . 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 History of abnormal mammogram   . Hyperlipidemia   . Hypertension   . Irregular heart beat   . Neuromuscular disorder (HCC)    fibromyalgia  . Sleep apnea    cpap some  . SVD (spontaneous vaginal delivery)    x 2  . Tubular adenoma of colon 05/09/12   "innumerable number" of flat polyps    Past Surgical History:  Procedure Laterality Date  . CARPAL TUNNEL RELEASE Left 10/20/14   [facility]  . CATARACT EXTRACTION, BILATERAL    . [place]  . CHOLECYSTECTOMY  1990  . COLONOSCOPY    . IR RADIOLOGIST EVAL & MGMT  07/16/2019  . KNEE ARTHROSCOPY WITH SUBCHONDROPLASTY Right 03/27/2018   Procedure: Right knee arthroscopic partial medial meniscus repair versus menisectomy with medial tibia subchondroplasty;  Surgeon: Nicholes Stairs, MD;  Location: [facility];  Service: Orthopedics;  Laterality: Right;  120 mins  . LARYNX SURGERY     POLYP REMOVAL  . MULTIPLE TOOTH EXTRACTIONS    . POLYPECTOMY     vocal cord  . TONSILLECTOMY  1965  . TUBAL LIGATION    . TUMOR REMOVAL     benign fatty tumors on abdomen  . UPPER GASTROINTESTINAL ENDOSCOPY      Family History  Problem Relation Age of Onset  . Prostate cancer Father        d.89 diagnosed in late-60s  . Colon cancer Father        diagnosed at 76  . Heart failure Mother        d.37  . Heart  disease Mother   . Colon polyps Brother        polyposis. Underwent partial colectomy at age 45.  Marland Kitchen Spina bifida Brother        died at birth  . Stomach cancer Paternal Aunt        questionable  . Kidney cancer Cousin        d.78s maternal first-cousin  . Other Other 16       multiple lipomas  . Colon cancer Paternal Uncle        in his 29s  . Heart failure Maternal Grandmother  d.72  . Heart failure Maternal Uncle        d.50  . Colon polyps Son 73  . Heart attack Son 76  . Leukemia Maternal Aunt   . Esophageal cancer Neg Hx   . Rectal cancer Neg Hx     Social History   Socioeconomic History  . Marital status: Married    Spouse name: [name]  . Number of children: 3  . Years of education: 46  . Highest education level: GED or equivalent  Occupational History  . Occupation: retired  Tobacco Use  . Smoking status: Former Smoker    Packs/day: 1.00    Years: 3.00    Pack years: 3.00    Types: Cigarettes    Quit date: 08/15/1969    Years since quitting: 50.7  . Smokeless tobacco: Never Used  Vaping Use  . Vaping Use: Never used  Substance and Sexual Activity  . Alcohol use: No    Alcohol/week: 0.0 standard drinks  . Drug use: No  . Sexual activity: Not Currently  Other Topics Concern  . Not on file  Social History Narrative  . Not on file   Social Determinants of Health   Financial Resource Strain: Low Risk   . Difficulty of Paying Living Expenses: Not hard at all  Food Insecurity: No Food Insecurity  . Worried About Charity fundraiser in the Last Year: Never true  . Ran Out of Food in the Last Year: Never true  Transportation Needs: No Transportation Needs  . Lack of Transportation (Medical): No  . Lack of Transportation (Non-Medical): No  Physical Activity: Inactive  . Days of Exercise per Week: 0 days  . Minutes of Exercise per Session: 0 min  Stress: No Stress Concern Present  . Feeling of Stress : Only a little  Social Connections: Socially  Integrated  . Frequency of Communication with Friends and Family: More than three times a week  . Frequency of Social Gatherings with Friends and Family: More than three times a week  . Attends Religious Services: More than 4 times per year  . Active Member of Clubs or Organizations: Yes  . Attends Archivist Meetings: More than 4 times per year  . Marital Status: Married  Human resources officer Violence: Not At Risk  . Fear of Current or Ex-Partner: No  . Emotionally Abused: No  . Physically Abused: No  . Sexually Abused: No      Review of systems: All other review of systems negative except as mentioned in the HPI.   Physical Exam: Vitals:   05/18/20 1355  BP: 130/64  Pulse: (!) 56  SpO2: 98%   Body mass index is 40.57 kg/m. Gen:      No acute distress HEENT:  sclera anicteric Abd:      soft, non-tender; no palpable masses, no distension Ext:    No edema Neuro: alert and oriented x 3 Psych: normal mood and affect  Data Reviewed:  Reviewed labs, radiology imaging, old records and pertinent past GI work up   Assessment and Plan/Recommendations:  74 year old very pleasant female with history of morbid obesity, hyperlipidemia, type 2 diabetes and Nash decompensated with hepatic encephalopathy  Low meld score.  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 The patient agreed with the plan and demonstrated an understanding of the instructions.  Damaris Hippo , MD    CC: Janora Norlander, DO

## 2020-05-18 NOTE — Patient Instructions (Addendum)
Go to the basement for labs today  We will refill Xifaxian and lactulose today  We have sent your demographic information and a prescription for Xifaxan to [facility]. This pharmacy is able to get medication approved through insurance and get you the lowest copay possible. If you have not heard from them within 1 week, please call our office at [phone] to let us know.  Take the lactulose with the goal of having 2-3 bowel movements a day   Follow up in 3 months  If you are age 31 or older, your body mass index should be between 23-30. Your Body mass index is 40.57 kg/m. If this is out of the aforementioned range listed, please consider follow up with your Primary Care Provider.  If you are age 23 or younger, your body mass index should be between 19-25. Your Body mass index is 40.57 kg/m. If this is out of the aformentioned range listed, please consider follow up with your Primary Care Provider.    Due to recent changes in healthcare laws, you may see the results of your imaging and laboratory studies on MyChart before your provider has had a chance to review them.  We understand that in some cases there may be results that are confusing or concerning to you. Not all laboratory results come back in the same time frame and the provider may be waiting for multiple results in order to interpret others.  Please give [place] 48 hours in order for your provider to thoroughly review all the results before contacting the office for clarification of your results.   I appreciate the  opportunity to care for you  Thank You   Harl Bowie , MD

## 2020-05-19 ENCOUNTER — Other Ambulatory Visit (HOSPITAL_COMMUNITY): Payer: Self-pay

## 2020-05-19 DIAGNOSIS — D696 Thrombocytopenia, unspecified: Secondary | ICD-10-CM

## 2020-05-19 DIAGNOSIS — R911 Solitary pulmonary nodule: Secondary | ICD-10-CM

## 2020-05-20 ENCOUNTER — Inpatient Hospital Stay (HOSPITAL_COMMUNITY): Payer: PPO | Attending: Hematology

## 2020-05-20 ENCOUNTER — Telehealth: Payer: Self-pay | Admitting: Gastroenterology

## 2020-05-20 ENCOUNTER — Other Ambulatory Visit: Payer: Self-pay

## 2020-05-20 DIAGNOSIS — R911 Solitary pulmonary nodule: Secondary | ICD-10-CM | POA: Insufficient documentation

## 2020-05-20 DIAGNOSIS — Z8051 Family history of malignant neoplasm of kidney: Secondary | ICD-10-CM | POA: Diagnosis not present

## 2020-05-20 DIAGNOSIS — R200 Anesthesia of skin: Secondary | ICD-10-CM | POA: Insufficient documentation

## 2020-05-20 DIAGNOSIS — Z806 Family history of leukemia: Secondary | ICD-10-CM | POA: Diagnosis not present

## 2020-05-20 DIAGNOSIS — R519 Headache, unspecified: Secondary | ICD-10-CM | POA: Insufficient documentation

## 2020-05-20 DIAGNOSIS — R5383 Other fatigue: Secondary | ICD-10-CM | POA: Insufficient documentation

## 2020-05-20 DIAGNOSIS — Z79899 Other long term (current) drug therapy: Secondary | ICD-10-CM | POA: Insufficient documentation

## 2020-05-20 DIAGNOSIS — Z87891 Personal history of nicotine dependence: Secondary | ICD-10-CM | POA: Diagnosis not present

## 2020-05-20 DIAGNOSIS — R0602 Shortness of breath: Secondary | ICD-10-CM | POA: Insufficient documentation

## 2020-05-20 DIAGNOSIS — R161 Splenomegaly, not elsewhere classified: Secondary | ICD-10-CM | POA: Diagnosis not present

## 2020-05-20 DIAGNOSIS — K7460 Unspecified cirrhosis of liver: Secondary | ICD-10-CM | POA: Insufficient documentation

## 2020-05-20 DIAGNOSIS — R42 Dizziness and giddiness: Secondary | ICD-10-CM | POA: Insufficient documentation

## 2020-05-20 DIAGNOSIS — Z8249 Family history of ischemic heart disease and other diseases of the circulatory system: Secondary | ICD-10-CM | POA: Diagnosis not present

## 2020-05-20 DIAGNOSIS — E785 Hyperlipidemia, unspecified: Secondary | ICD-10-CM | POA: Insufficient documentation

## 2020-05-20 DIAGNOSIS — E669 Obesity, unspecified: Secondary | ICD-10-CM | POA: Insufficient documentation

## 2020-05-20 DIAGNOSIS — Z8042 Family history of malignant neoplasm of prostate: Secondary | ICD-10-CM | POA: Insufficient documentation

## 2020-05-20 DIAGNOSIS — R197 Diarrhea, unspecified: Secondary | ICD-10-CM | POA: Insufficient documentation

## 2020-05-20 DIAGNOSIS — Z8371 Family history of colonic polyps: Secondary | ICD-10-CM | POA: Diagnosis not present

## 2020-05-20 DIAGNOSIS — Z800 Family history of malignant neoplasm of digestive organs: Secondary | ICD-10-CM | POA: Diagnosis not present

## 2020-05-20 DIAGNOSIS — D696 Thrombocytopenia, unspecified: Secondary | ICD-10-CM | POA: Insufficient documentation

## 2020-05-20 DIAGNOSIS — R002 Palpitations: Secondary | ICD-10-CM | POA: Insufficient documentation

## 2020-05-20 LAB — CBC WITH DIFFERENTIAL/PLATELET
Abs Immature Granulocytes: 0 10*3/uL (ref 0.00–0.07)
Basophils Absolute: 0 10*3/uL (ref 0.0–0.1)
Basophils Relative: 1 %
Eosinophils Absolute: 0.1 10*3/uL (ref 0.0–0.5)
Eosinophils Relative: 2 %
HCT: 40.5 % (ref 36.0–46.0)
Hemoglobin: 13.5 g/dL (ref 12.0–15.0)
Immature Granulocytes: 0 %
Lymphocytes Relative: 36 %
Lymphs Abs: 1.8 10*3/uL (ref 0.7–4.0)
MCH: 33.1 pg (ref 26.0–34.0)
MCHC: 33.3 g/dL (ref 30.0–36.0)
MCV: 99.3 fL (ref 80.0–100.0)
Monocytes Absolute: 0.5 10*3/uL (ref 0.1–1.0)
Monocytes Relative: 10 %
Neutro Abs: 2.6 10*3/uL (ref 1.7–7.7)
Neutrophils Relative %: 51 %
Platelets: 67 10*3/uL — ABNORMAL LOW (ref 150–400)
RBC: 4.08 MIL/uL (ref 3.87–5.11)
RDW: 13.6 % (ref 11.5–15.5)
WBC: 5 10*3/uL (ref 4.0–10.5)
nRBC: 0 % (ref 0.0–0.2)

## 2020-05-20 LAB — COMPREHENSIVE METABOLIC PANEL
ALT: 32 U/L (ref 0–44)
AST: 46 U/L — ABNORMAL HIGH (ref 15–41)
Albumin: 3.1 g/dL — ABNORMAL LOW (ref 3.5–5.0)
Alkaline Phosphatase: 87 U/L (ref 38–126)
Anion gap: 8 (ref 5–15)
BUN: 22 mg/dL (ref 8–23)
CO2: 28 mmol/L (ref 22–32)
Calcium: 9 mg/dL (ref 8.9–10.3)
Chloride: 103 mmol/L (ref 98–111)
Creatinine, Ser: 0.78 mg/dL (ref 0.44–1.00)
GFR calc non Af Amer: >60 mL/min (ref >60)
Glucose, Bld: 164 mg/dL — ABNORMAL HIGH (ref 70–99)
Potassium: 3.8 mmol/L (ref 3.5–5.1)
Sodium: 139 mmol/L (ref 135–145)
Total Bilirubin: 2.6 mg/dL — ABNORMAL HIGH (ref 0.3–1.2)
Total Protein: 5.7 g/dL — ABNORMAL LOW (ref 6.5–8.1)

## 2020-05-20 LAB — VITAMIN B12: Vitamin B-12: 300 pg/mL (ref 180–914)

## 2020-05-20 LAB — VITAMIN D 25 HYDROXY (VIT D DEFICIENCY, FRACTURES): Vit D, 25-Hydroxy: 53.78 ng/mL (ref 30–100)

## 2020-05-20 LAB — LACTATE DEHYDROGENASE: LDH: 165 U/L (ref 98–192)

## 2020-05-21 ENCOUNTER — Telehealth: Payer: Self-pay | Admitting: Gastroenterology

## 2020-05-21 ENCOUNTER — Other Ambulatory Visit: Payer: Self-pay

## 2020-05-21 ENCOUNTER — Encounter: Payer: Self-pay | Admitting: Gastroenterology

## 2020-05-21 NOTE — Telephone Encounter (Signed)
Thank you [name], discussed with patient and her son during office visit the thrombocytopenia is part of her chronic liver disease Stacey Summers cirrhosis.  Advised patient to avoid high sugar intake, soda or high carb diet as her liver disease is predominantly metabolic.

## 2020-05-21 NOTE — Telephone Encounter (Addendum)
There is a lot of concern about the platelets falling so low. I spoke with Stacey Summers, the daughter. She understands it is a part of the liver disease. She does not know what she and her father should watch for or what to tell her mother.

## 2020-05-22 NOTE — Telephone Encounter (Signed)
Shared this information with the daughter. She is struggling to understand and recognized the expected progression of her mother's condition.

## 2020-05-26 NOTE — Telephone Encounter (Signed)
Another phone note was created. Patient was given results.

## 2020-05-27 ENCOUNTER — Other Ambulatory Visit: Payer: Self-pay

## 2020-05-27 ENCOUNTER — Inpatient Hospital Stay (HOSPITAL_BASED_OUTPATIENT_CLINIC_OR_DEPARTMENT_OTHER): Payer: PPO | Admitting: Hematology

## 2020-05-27 VITALS — BP 146/60 | HR 63 | Temp 97.3°F | Resp 20 | Wt 224.6 lb

## 2020-05-27 DIAGNOSIS — D696 Thrombocytopenia, unspecified: Secondary | ICD-10-CM | POA: Diagnosis not present

## 2020-05-27 NOTE — Progress Notes (Signed)
[name], [place] [address]   CLINIC:  Medical Oncology/Hematology  PCP:  Stacey Norlander, DO [address] [place] [address]  [phone]  REASON FOR VISIT:  Follow-up for thrombocytopenia  PRIOR THERAPY: None  CURRENT THERAPY: Observation  INTERVAL HISTORY:  [name], a 74 y.o. female, returns for routine follow-up for her thrombocytopenia. Stacey Summers was last contacted via telephone on 10/10/2019.  Today she is accompanied by her husband. She reports feeling well. She denies nosebleeds, hematochezia or hematuria. She is worried about her low platelets and wants them rechecked.   REVIEW OF SYSTEMS:  Review of Systems  Constitutional: Positive for fatigue (depleted). Negative for appetite change.  HENT:   Positive for trouble swallowing (occasional). Negative for nosebleeds.   Respiratory: Positive for shortness of breath (w/ exertion).   Cardiovascular: Positive for palpitations.  Gastrointestinal: Positive for diarrhea (occasional) and nausea (occasional). Negative for blood in stool.  Genitourinary: Negative for hematuria.   Neurological: Positive for dizziness (occasional), headaches and numbness.  All other systems reviewed and are negative.   PAST MEDICAL/SURGICAL HISTORY:  Past Medical History:  Diagnosis Date  . Allergy    seasonal  . Anxiety   . Arthritis    hands, back   . Cataract    surgery to remove  . Chronic back pain    lower back   . Cirrhosis of liver without ascites ([facility])    recent dx 07/11/18  . COPD (chronic obstructive pulmonary disease) (HCC)    no inhaler  . Diabetes mellitus without complication ([facility])   . DJD (degenerative joint disease)   . Fibromyalgia   . Genetic testing 03/08/2017   Stacey Summers underwent genetic counseling and testing for hereditary cancer syndromes on 02/21/2017. Her results were negative for mutations in all 83 genes analyzed by [facility]'s 83-gene  Multi-Cancers Panel. Genes analyzed include: ALK, APC, ATM, AXIN2, BAP1, BARD1, BLM, BMPR1A, BRCA1, BRCA2, BRIP1, CASR, CDC73, CDH1, CDK4, CDKN1B, CDKN1C, CDKN2A, CEBPA, CHEK2, CTNNA1, DICER1, DIS3L2, EGFR, EPCAM  . GERD (gastroesophageal reflux disease)   . 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 History of abnormal mammogram   . Hyperlipidemia   . Hypertension   . Irregular heart beat   . Neuromuscular disorder (HCC)    fibromyalgia  . Sleep apnea    cpap some  . SVD (spontaneous vaginal delivery)    x 2  . Tubular adenoma of colon 05/09/12   "innumerable number" of flat polyps   Past Surgical History:  Procedure Laterality Date  . CARPAL TUNNEL RELEASE Left 10/20/14   [facility]  . CATARACT EXTRACTION, BILATERAL    . [facility]  . CHOLECYSTECTOMY  1990  . COLONOSCOPY    . IR RADIOLOGIST EVAL & MGMT  07/16/2019  . KNEE ARTHROSCOPY WITH SUBCHONDROPLASTY Right 03/27/2018   Procedure: Right knee arthroscopic partial medial meniscus repair versus menisectomy with medial tibia subchondroplasty;  Surgeon: Nicholes Stairs, MD;  Location: [facility];  Service: Orthopedics;  Laterality: Right;  120 mins  . LARYNX SURGERY     POLYP REMOVAL  . MULTIPLE TOOTH EXTRACTIONS    . POLYPECTOMY     vocal cord  . TONSILLECTOMY  1965  . TUBAL LIGATION    . TUMOR REMOVAL     benign fatty tumors on abdomen  . UPPER GASTROINTESTINAL ENDOSCOPY      SOCIAL HISTORY:  Social History   Socioeconomic  History  . Marital status: Married    Spouse name: Stacey Summers  . Number of children: 3  . Years of education: 49  . Highest education level: GED or equivalent  Occupational History  . Occupation: retired  Tobacco Use  . Smoking status: Former Smoker    Packs/day: 1.00    Years: 3.00    Pack years: 3.00    Types: Cigarettes    Quit date: 08/15/1969    Years since quitting: 50.8  . Smokeless tobacco: Never Used  Vaping Use  . Vaping Use: Never used  Substance and Sexual Activity   . Alcohol use: No    Alcohol/week: 0.0 standard drinks  . Drug use: No  . Sexual activity: Not Currently  Other Topics Concern  . Not on file  Social History Narrative  . Not on file   Social Determinants of Health   Financial Resource Strain: Low Risk   . Difficulty of Paying Living Expenses: Not hard at all  Food Insecurity: No Food Insecurity  . Worried About Charity fundraiser in the Last Year: Never true  . Ran Out of Food in the Last Year: Never true  Transportation Needs: No Transportation Needs  . Lack of Transportation (Medical): No  . Lack of Transportation (Non-Medical): No  Physical Activity: Inactive  . Days of Exercise per Week: 0 days  . Minutes of Exercise per Session: 0 min  Stress: No Stress Concern Present  . Feeling of Stress : Only a little  Social Connections: Socially Integrated  . Frequency of Communication with Friends and Family: More than three times a week  . Frequency of Social Gatherings with Friends and Family: More than three times a week  . Attends Religious Services: More than 4 times per year  . Active Member of Clubs or Organizations: Yes  . Attends Archivist Meetings: More than 4 times per year  . Marital Status: Married  Human resources officer Violence: Not At Risk  . Fear of Current or Ex-Partner: No  . Emotionally Abused: No  . Physically Abused: No  . Sexually Abused: No    FAMILY HISTORY:  Family History  Problem Relation Age of Onset  . Prostate cancer Father        d.89 diagnosed in late-60s  . Colon cancer Father        diagnosed at 78  . Heart failure Mother        d.37  . Heart disease Mother   . Colon polyps Brother        polyposis. Underwent partial colectomy at age 32.  Marland Kitchen Spina bifida Brother        died at birth  . Stomach cancer Paternal Aunt        questionable  . Kidney cancer Cousin        d.78s maternal first-cousin  . Other Other 16       multiple lipomas  . Colon cancer Paternal Uncle         in his 36s  . Heart failure Maternal Grandmother        d.45  . Heart failure Maternal Uncle        d.50  . Colon polyps Son 19  . Heart attack Son 60  . Leukemia Maternal Aunt   . Esophageal cancer Neg Hx   . Rectal cancer Neg Hx     CURRENT MEDICATIONS:  Current Outpatient Medications  Medication Sig Dispense Refill  . ALPRAZolam (XANAX) 0.25 MG tablet  Take 1 tablet (0.25 mg total) by mouth 2 (two) times daily as needed for anxiety. 30 tablet 1  . atorvastatin (LIPITOR) 40 MG tablet Take 1 tablet (40 mg total) by mouth daily. 90 tablet 3  . benazepril (LOTENSIN) 40 MG tablet Take 1 tablet by mouth once daily 90 tablet 0  . blood glucose meter kit and supplies KIT Dispense based on patient and insurance preference. Use up to four times daily as directed. (FOR ICD-9 250.00, 250.01). 1 each 10  . Blood Glucose Monitoring Suppl (LIBERTY BLOOD GLUCOSE MONITOR) DEVI 1 each by Does not apply route daily. Test 1x per day and prn-dx250.02 1 each 0  . Cholecalciferol (VITAMIN D3) 5000 units CAPS Take 5,000 Units by mouth daily.    . cyclobenzaprine (FLEXERIL) 5 MG tablet TAKE 1 TABLET BY MOUTH THREE TIMES DAILY AS NEEDED FOR MUSCLE SPASMS (NEEDS APPOINTMENT) 30 tablet 0  . diltiazem (CARDIZEM CD) 120 MG 24 hr capsule Take 1 capsule by mouth once daily 90 capsule 2  . fluticasone (FLONASE) 50 MCG/ACT nasal spray Place 1 spray into both nostrils in the morning and at bedtime. 16 g 6  . furosemide (LASIX) 40 MG tablet TAKE 1 TABLET BY MOUTH IN THE MORNING AND 1/2 (ONE-HALF) TABLET AT 1 PM 135 tablet 0  . glucose blood test strip Test 1x per day and prn- dx 250.02 100 each 2  . lactulose (CHRONULAC) 10 GM/15ML solution Take 15 ml three times a day and as needed 946 mL 11  . meclizine (ANTIVERT) 25 MG tablet Take 1 tablet (25 mg total) by mouth 3 (three) times daily as needed for dizziness. 30 tablet 0  . metFORMIN (GLUCOPHAGE) 500 MG tablet Take 1 tablet (500 mg total) by mouth 2 (two) times daily  with a meal. 180 tablet 3  . ONE TOUCH ULTRA TEST test strip USE ONE STRIP TO CHECK GLUCOSE ONCE DAILY 100 each 1  . ONETOUCH DELICA LANCETS 98P MISC USE ONE LANCET TO CHECK GLUCOSE ONCE DAILY AND AS NEEDED 100 each 0  . rifaximin (XIFAXAN) 550 MG TABS tablet Take 1 tablet (550 mg total) by mouth 2 (two) times daily. 60 tablet 3  . traMADol (ULTRAM) 50 MG tablet Take 1 tablet (50 mg total) by mouth every 12 (twelve) hours as needed for moderate pain. 30 tablet 1  . ondansetron (ZOFRAN) 4 MG tablet Take 1 tablet (4 mg total) by mouth every 8 (eight) hours as needed for nausea or vomiting. (Patient not taking: Reported on 05/27/2020) 30 tablet 1   No current facility-administered medications for this visit.    ALLERGIES:  Allergies  Allergen Reactions  . Latex     "my hands feels numb"    PHYSICAL EXAM:  Performance status (ECOG): 1 - Symptomatic but completely ambulatory  Vitals:   05/27/20 1451  BP: (!) 146/60  Pulse: 63  Resp: 20  Temp: (!) 97.3 F (36.3 C)  SpO2: 98%   Wt Readings from Last 3 Encounters:  05/27/20 224 lb 10.4 oz (101.9 kg)  05/18/20 221 lb 12.8 oz (100.6 kg)  03/18/20 223 lb 9.6 oz (101.4 kg)   Physical Exam Vitals reviewed.  Constitutional:      Appearance: Normal appearance. She is obese.  Neurological:     General: No focal deficit present.     Mental Status: She is alert and oriented to person, place, and time.  Psychiatric:        Mood and Affect: Mood normal.  Behavior: Behavior normal.     LABORATORY DATA:  I have reviewed the labs as listed.  CBC Latest Ref Rng & Units 05/20/2020 05/18/2020 03/18/2020  WBC 4.0 - 10.5 K/uL 5.0 5.4 5.7  Hemoglobin 12.0 - 15.0 g/dL 13.5 13.7 12.4  Hematocrit 36 - 46 % 40.5 40.6 38.1  Platelets 150 - 400 K/uL 67(L) 73.0(L) 82(LL)   CMP Latest Ref Rng & Units 05/20/2020 05/18/2020 03/18/2020  Glucose 70 - 99 mg/dL 164(H) 133(H) -  BUN 8 - 23 mg/dL 22 16 -  Creatinine 0.44 - 1.00 mg/dL 0.78 0.91 -  Sodium  135 - 145 mmol/L 139 142 -  Potassium 3.5 - 5.1 mmol/L 3.8 3.9 -  Chloride 98 - 111 mmol/L 103 104 -  CO2 22 - 32 mmol/L 28 32 -  Calcium 8.9 - 10.3 mg/dL 9.0 9.0 -  Total Protein 6.5 - 8.1 g/dL 5.7(L) 5.8(L) 5.6(L)  Total Bilirubin 0.3 - 1.2 mg/dL 2.6(H) 3.6(H) 2.4(H)  Alkaline Phos 38 - 126 U/L 87 94 113  AST 15 - 41 U/L 46(H) 50(H) 64(H)  ALT 0 - 44 U/L 32 33 43(H)      Component Value Date/Time   RBC 4.08 05/20/2020 1039   MCV 99.3 05/20/2020 1039   MCV 96 03/18/2020 1449   MCH 33.1 05/20/2020 1039   MCHC 33.3 05/20/2020 1039   RDW 13.6 05/20/2020 1039   RDW 13.0 03/18/2020 1449   LYMPHSABS 1.8 05/20/2020 1039   LYMPHSABS 1.9 03/18/2020 1449   MONOABS 0.5 05/20/2020 1039   EOSABS 0.1 05/20/2020 1039   EOSABS 0.2 03/18/2020 1449   BASOSABS 0.0 05/20/2020 1039   BASOSABS 0.0 03/18/2020 1449   Lab Results  Component Value Date   LDH 165 05/20/2020   LDH 172 10/04/2019   LDH 276 (H) 06/28/2019   Lab Results  Component Value Date   VD25OH 53.78 05/20/2020   VD25OH 46.62 10/04/2019   VD25OH 39.1 05/28/2014    DIAGNOSTIC IMAGING:  I have independently reviewed the scans and discussed with the patient. No results found.   ASSESSMENT:  1.  Thrombocytopenia: -Platelet count is ranging anywhere between 80-1 30. -CT of the AP on 06/27/2018 reviewed by me showed spleen is mildly enlarged.  She has cirrhosis of the liver. -We reviewed labs which showed platelet count 81.  B12 was normal. 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  She had many questions about cirrhosis and portal hypertension which were answered.   2.  Lung nodules: -I have independently reviewed CT of the chest from 10/04/2019 which showed resolution of recent COVID-19 pneumonia.  Bilateral pulmonary nodules, less than 4 mm in size have been stable. -She is a non-smoker and has not smoked cigarettes since 1971.  Hence I did not recommend any follow-up CT scans.   PLAN:  1.  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 -Latest CBC on 05/20/2020 shows platelet count 67.  Platelet count was 82 on 03/18/2020. -White count and hemoglobin are normal.  B12 was normal. -She does not have any easy bruising or bleeding. -If the platelet count drops below 30,000, splenic artery embolization can be considered. -We will see her back in 3 months for follow-up with repeat labs.    Orders placed this encounter:  No orders of the defined types were placed in this encounter.    Derek Jack, MD [place] [phone]   I, Milinda Antis, am acting as a scribe for Dr.  Sanda Linger.  I, Derek Jack MD, have reviewed the above documentation for accuracy and completeness, and I agree with the above.

## 2020-05-27 NOTE — Patient Instructions (Signed)
[place] at [facility] Discharge Instructions  You were seen today by Dr. Delton Coombes. He went over your recent results. Your next appointment will be with the nurse practitioner in 3 months for labs and follow up.   Thank you for choosing [place] at [facility] to provide your oncology and hematology care.  To afford each patient quality time with our provider, please arrive at least 15 minutes before your scheduled appointment time.   If you have a lab appointment with the [facility] please come in thru the Main Entrance and check in at the main information desk  You need to re-schedule your appointment should you arrive 10 or more minutes late.  We strive to give you quality time with our providers, and arriving late affects you and other patients whose appointments are after yours.  Also, if you no show three or more times for appointments you may be dismissed from the clinic at the providers discretion.     Again, thank you for choosing [facility].  Our hope is that these requests will decrease the amount of time that you wait before being seen by our physicians.       _____________________________________________________________  Should you have questions after your visit to [facility], please contact our office at [phone] between the hours of 8:00 a.m. and 4:30 p.m.  Voicemails left after 4:00 p.m. will not be returned until the following business day.  For prescription refill requests, have your pharmacy contact our office and allow 72 hours.    Cancer Center Support Programs:   > Cancer Support Group  2nd Tuesday of the month 1pm-2pm, Journey Room

## 2020-06-15 ENCOUNTER — Telehealth: Payer: Self-pay | Admitting: Gastroenterology

## 2020-06-15 NOTE — Telephone Encounter (Signed)
Patients daughter called stated that the patient get forms filled out and sent for Xifaxan medication and this is done a=on a yearly basis. She is wanting to follow up on the forms for this year.

## 2020-06-16 MED ORDER — RIFAXIMIN 550 MG PO TABS
550.0000 mg | ORAL_TABLET | Freq: Two times a day (BID) | ORAL | 3 refills | Status: DC
Start: 2020-06-16 — End: 2020-08-04

## 2020-06-16 NOTE — Telephone Encounter (Signed)
It looks like [facility] received the new script on 10/4  Resent the script to [facility] today , they are the one that handle the authorization for her [employer]

## 2020-06-17 NOTE — Telephone Encounter (Signed)
Called patient to inform her that we have not received any PAP paperwork from [facility] for xifaxain, It looks like it doesn't expire until 12/21   Told daughter to contact [employer] to mail her the paperwork for [place] to fill out, also pt's daughter needed mother seen before the end of the year, I worked pt in for 12/22 at 3:20pm   I have all the original paperwork at my desk in a yellow envelope, never had it scanned in

## 2020-07-01 IMAGING — MG DIGITAL SCREENING BILATERAL MAMMOGRAM WITH TOMO AND CAD
6 of 12 series · 6 of 36 positions shown · non-contrast
Comparison: Previous exam(s).

CLINICAL DATA: Screening.

EXAM:
DIGITAL SCREENING BILATERAL MAMMOGRAM WITH TOMO AND CAD

[R MLO synth-2D (1 of 2)]
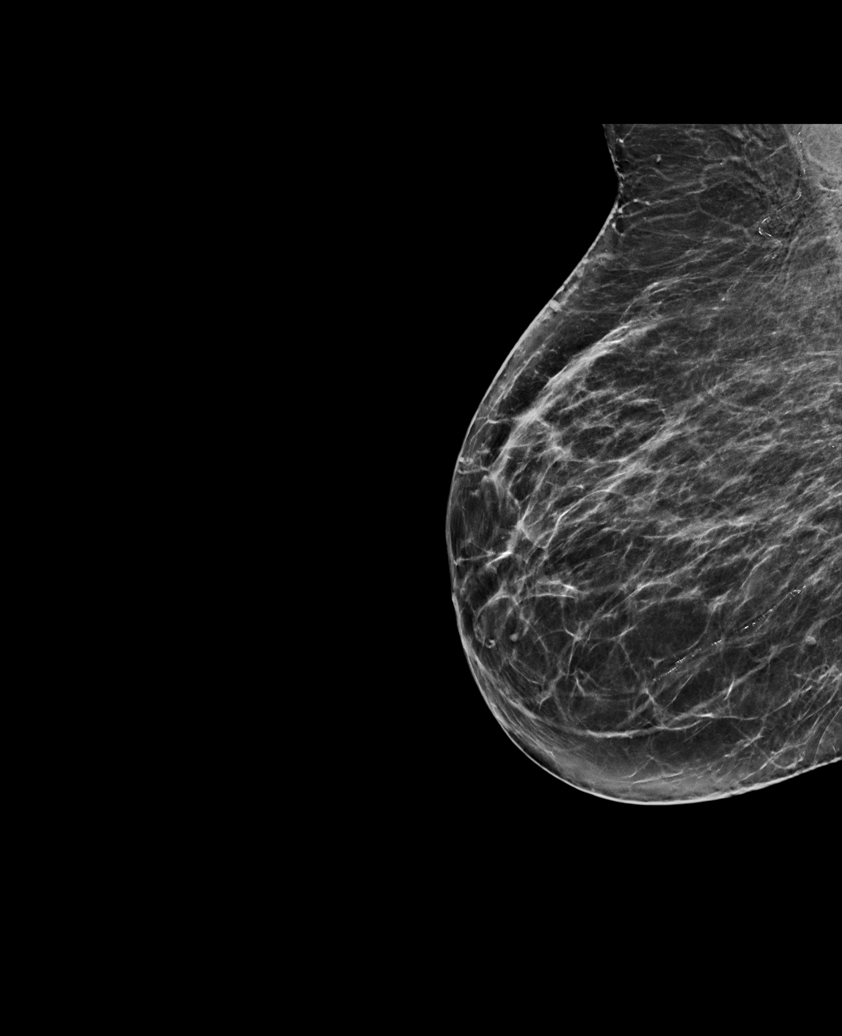

[L CC synth-2D]
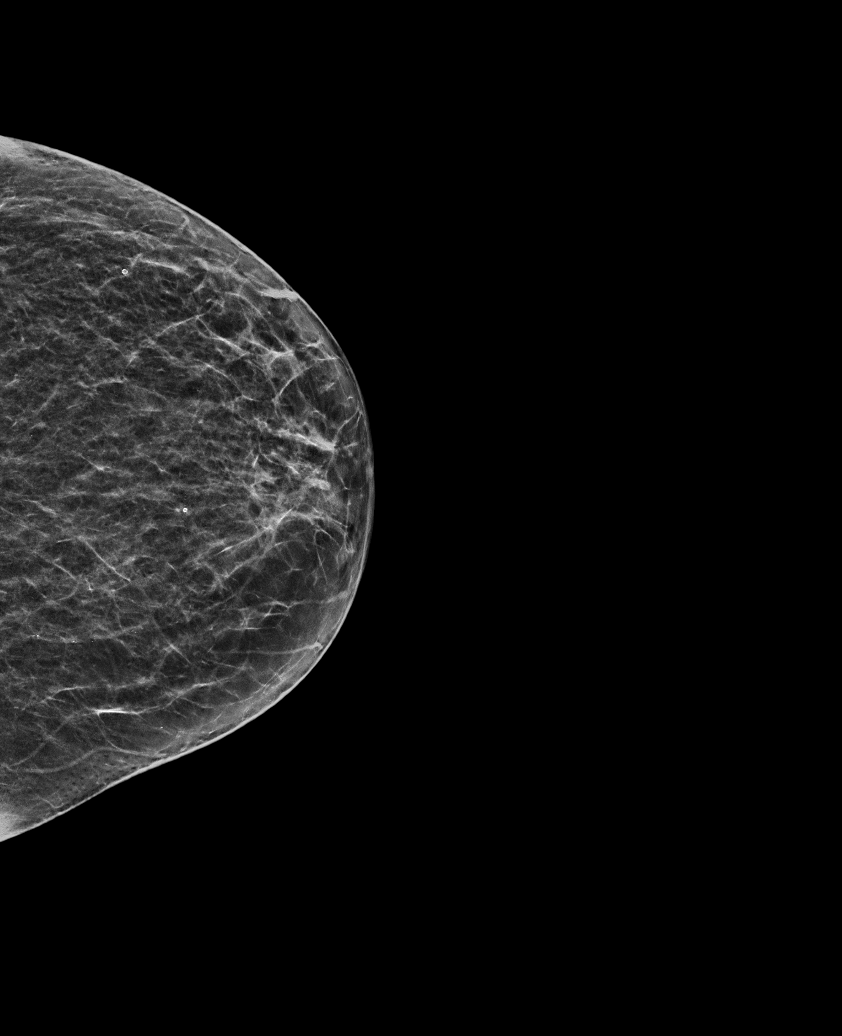

[R CC synth-2D]
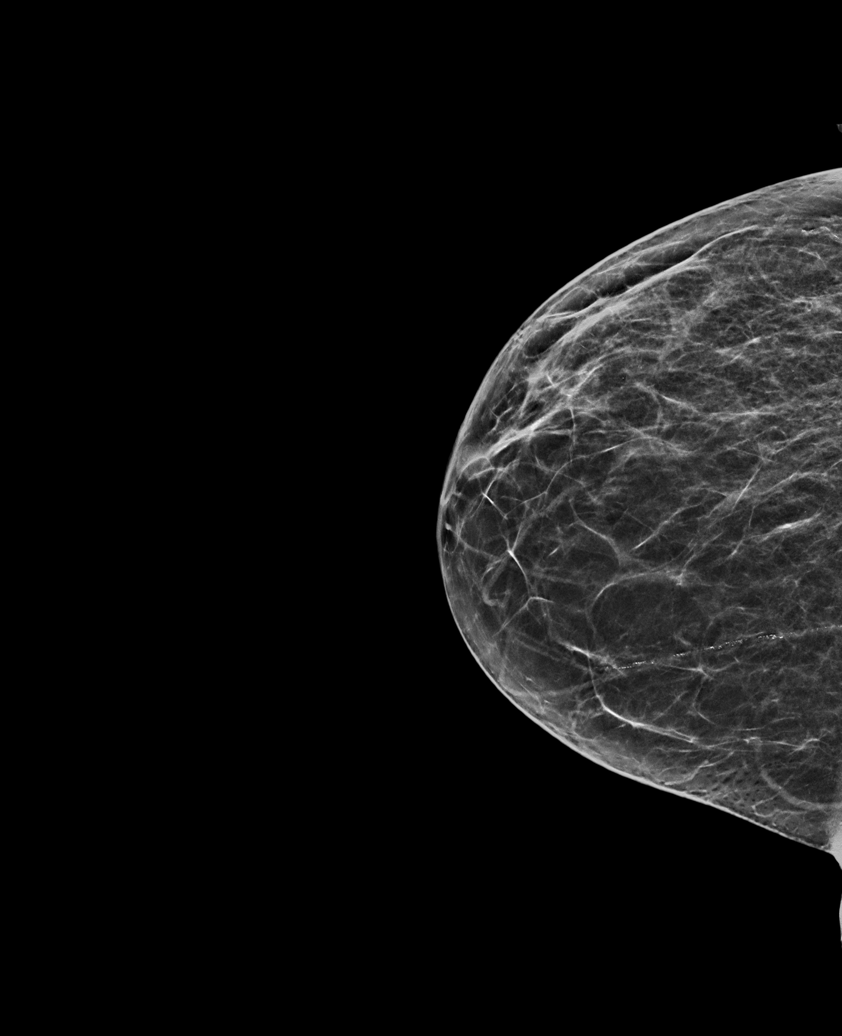

[L MLO synth-2D (1 of 2)]
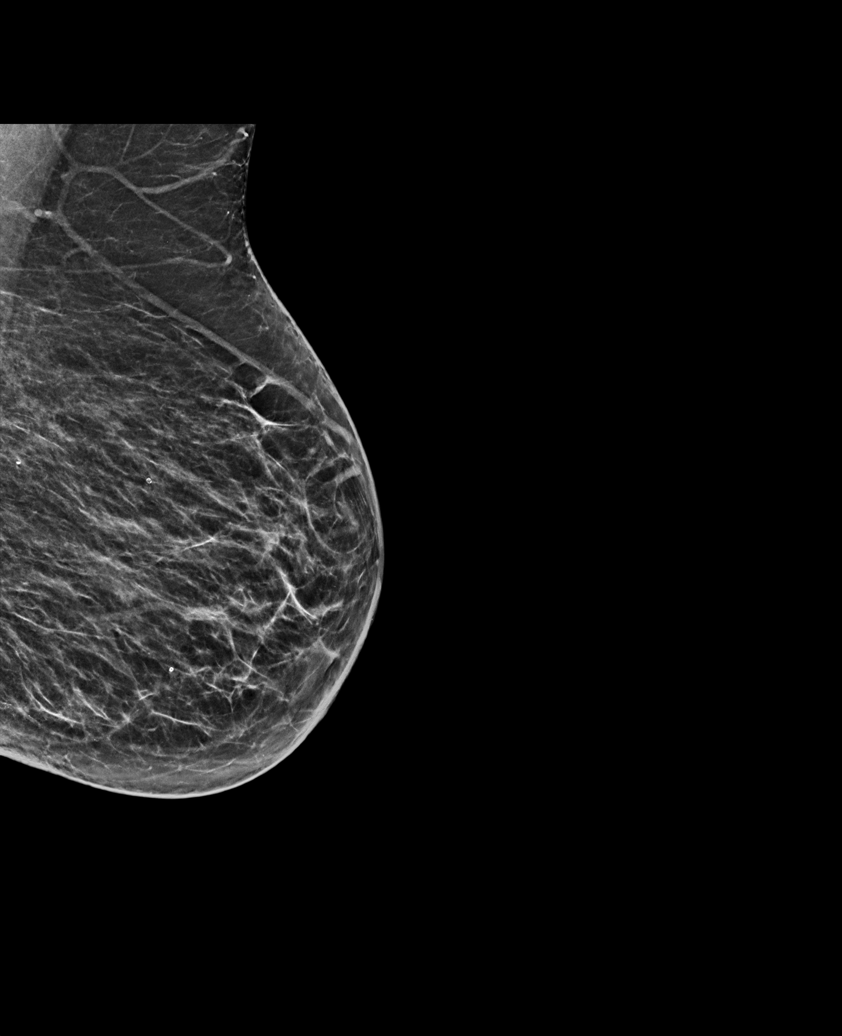

[L MLO synth-2D (2 of 2)]
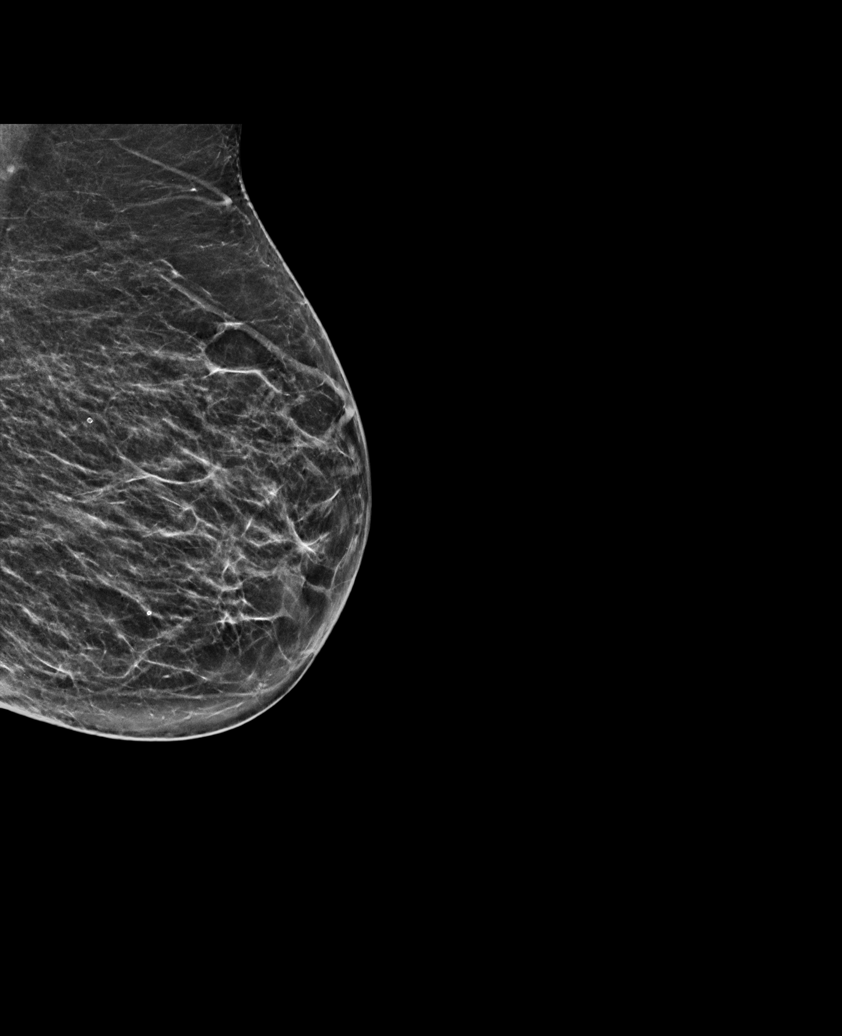

[R MLO synth-2D (2 of 2)]
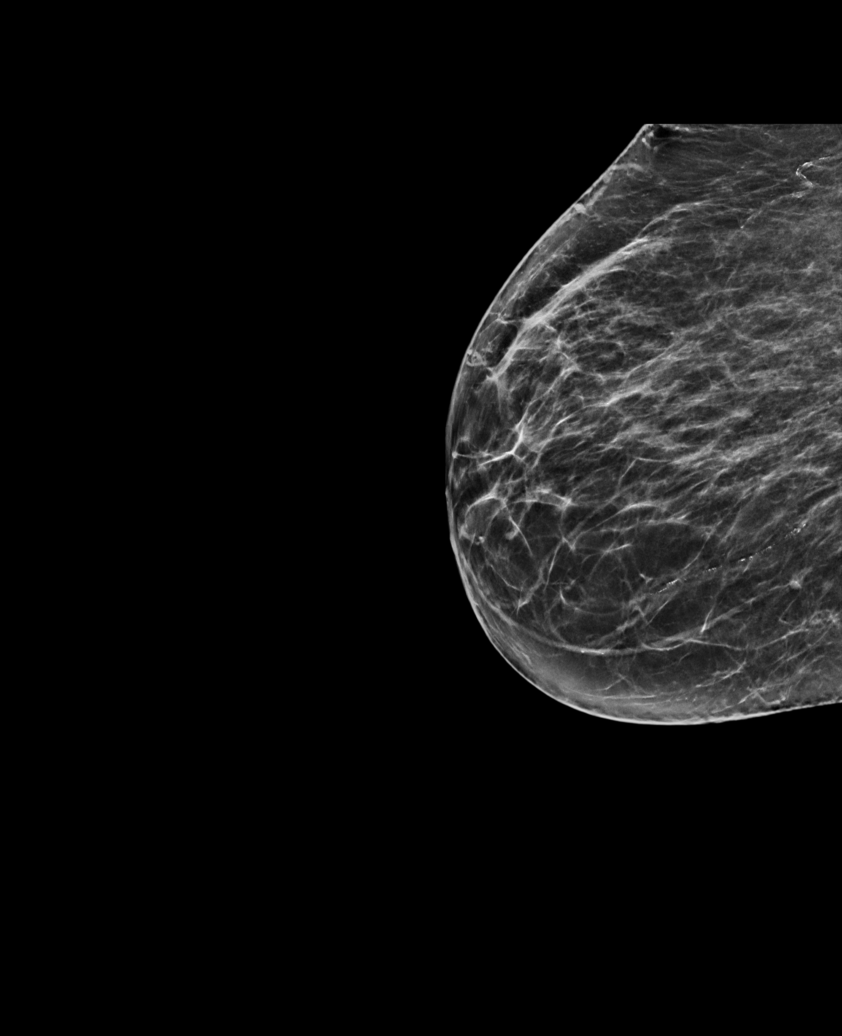

[6 of 36 positions shown; findings below may reference images not displayed]

ACR Breast Density Category b: There are scattered areas of
fibroglandular density.
FINDINGS: There are no findings suspicious for malignancy. Images were
processed with CAD.
IMPRESSION: No mammographic evidence of malignancy. A result letter of this
screening mammogram will be mailed directly to the patient.

RECOMMENDATION:
Screening mammogram in one year. (Code:[id number])

BI-RADS CATEGORY  1: Negative.

## 2020-07-05 ENCOUNTER — Other Ambulatory Visit: Payer: Self-pay | Admitting: Family Medicine

## 2020-07-11 ENCOUNTER — Other Ambulatory Visit: Payer: Self-pay | Admitting: Family Medicine

## 2020-07-11 DIAGNOSIS — E1159 Type 2 diabetes mellitus with other circulatory complications: Secondary | ICD-10-CM

## 2020-07-11 DIAGNOSIS — R600 Localized edema: Secondary | ICD-10-CM

## 2020-07-14 DIAGNOSIS — M48061 Spinal stenosis, lumbar region without neurogenic claudication: Secondary | ICD-10-CM | POA: Diagnosis not present

## 2020-07-14 DIAGNOSIS — E119 Type 2 diabetes mellitus without complications: Secondary | ICD-10-CM | POA: Diagnosis not present

## 2020-07-14 DIAGNOSIS — M545 Low back pain, unspecified: Secondary | ICD-10-CM | POA: Diagnosis not present

## 2020-07-20 ENCOUNTER — Encounter: Payer: Self-pay | Admitting: Family Medicine

## 2020-07-20 ENCOUNTER — Other Ambulatory Visit: Payer: Self-pay

## 2020-07-20 ENCOUNTER — Ambulatory Visit (INDEPENDENT_AMBULATORY_CARE_PROVIDER_SITE_OTHER): Payer: PPO | Admitting: Family Medicine

## 2020-07-20 VITALS — BP 122/63 | HR 61 | Temp 97.5°F | Ht 62.0 in | Wt 211.4 lb

## 2020-07-20 DIAGNOSIS — Z789 Other specified health status: Secondary | ICD-10-CM

## 2020-07-20 DIAGNOSIS — K7581 Nonalcoholic steatohepatitis (NASH): Secondary | ICD-10-CM

## 2020-07-20 DIAGNOSIS — M5442 Lumbago with sciatica, left side: Secondary | ICD-10-CM

## 2020-07-20 DIAGNOSIS — Z7409 Other reduced mobility: Secondary | ICD-10-CM | POA: Diagnosis not present

## 2020-07-20 DIAGNOSIS — K7460 Unspecified cirrhosis of liver: Secondary | ICD-10-CM

## 2020-07-20 DIAGNOSIS — D696 Thrombocytopenia, unspecified: Secondary | ICD-10-CM | POA: Diagnosis not present

## 2020-07-20 DIAGNOSIS — E11620 Type 2 diabetes mellitus with diabetic dermatitis: Secondary | ICD-10-CM | POA: Diagnosis not present

## 2020-07-20 DIAGNOSIS — F411 Generalized anxiety disorder: Secondary | ICD-10-CM

## 2020-07-20 DIAGNOSIS — E1169 Type 2 diabetes mellitus with other specified complication: Secondary | ICD-10-CM

## 2020-07-20 DIAGNOSIS — G8929 Other chronic pain: Secondary | ICD-10-CM | POA: Diagnosis not present

## 2020-07-20 LAB — BAYER DCA HB A1C WAIVED: HB A1C (BAYER DCA - WAIVED): 6.1 % (ref <7.0)

## 2020-07-20 MED ORDER — TRAMADOL HCL 50 MG PO TABS: 50.0000 mg | ORAL_TABLET | Freq: Two times a day (BID) | ORAL | 2 refills | Status: DC | PRN

## 2020-07-20 MED ORDER — LORAZEPAM 0.5 MG PO TABS
0.2500 mg | ORAL_TABLET | Freq: Two times a day (BID) | ORAL | 1 refills | Status: DC | PRN
Start: 2020-07-20 — End: 2020-12-09

## 2020-07-20 NOTE — Telephone Encounter (Signed)
Emailed [name] (Daughter) the patient application for PAP today

## 2020-07-20 NOTE — Telephone Encounter (Signed)
Stacey Summers,  patients daughter to see if she has filled out the application to [employer] PAP, She said she hasn't even received the application , I will try and go to their web site and e-mail it to her, She will bring the forms back to [place] on the 22nd at her office appointment   Daughter email is:    [name]

## 2020-07-20 NOTE — Patient Instructions (Signed)
We changed your Xanax to Ativan today.  Start with 1/2 tablet to see how you feel on this before advancing to the full tablet if needed.  This is a safer medication with your liver disease than the Xanax.  The tramadol has been renewed.  I will work on [employer], broadcasting/film/video.  If you do not hear from me within the next week, please contact the office

## 2020-07-20 NOTE — Progress Notes (Signed)
Subjective: CC: GAD, pain PCP: Stacey Norlander, DO [id number] Stacey Summers is a 74 y.o. female presenting to clinic today for:  1. GAD Anxiety is stable with as needed use of the Xanax.  She uses this about every 2 to 3 days for breakthrough panic.  She does report some sedation.  She continues to worry about her liver and overall health.  No falls or mental status changes with the Xanax  2.  Chronic right-sided low back pain Patient status post injection.  She has another injection planned for the end of the month.  She has ongoing back pain that limits her ability to ambulate.  She is only able to ambulate short distances before having severe pain and/or fatiguing out.  She is currently utilizing a cane but has a rolling walker at home as well.  Sometimes there are no electronic scooters at the grocery stores and this limits her ability to shop for her groceries.  She is asking for consideration of an electronic scooter.  She is discussed with her insurance and they said that they would pay for this if it was ordered  3.  Type 2 diabetes Patient reports poor appetite secondary to nausea that occurs after she takes her medicine for her liver.  She subsequently has had some weight loss.  She is compliant with Glucophage twice daily.   ROS: Per HPI  Allergies  Allergen Reactions  . Latex     "my hands feels numb"   Past Medical History:  Diagnosis Date  . Allergy    seasonal  . Anxiety   . Arthritis    hands, back   . Cataract    surgery to remove  . Chronic back pain    lower back   . Cirrhosis of liver without ascites ([place])    recent dx 07/11/18  . COPD (chronic obstructive pulmonary disease) (HCC)    no inhaler  . Diabetes mellitus without complication ([place])   . DJD (degenerative joint disease)   . Fibromyalgia   . Genetic testing 03/08/2017   Stacey Summers underwent genetic counseling and testing for hereditary cancer syndromes on 02/21/2017. Her results were negative  for mutations in all 83 genes analyzed by [facility]'s 83-gene Multi-Cancers Panel. Genes analyzed include: ALK, APC, ATM, AXIN2, BAP1, BARD1, BLM, BMPR1A, BRCA1, BRCA2, BRIP1, CASR, CDC73, CDH1, CDK4, CDKN1B, CDKN1C, CDKN2A, CEBPA, CHEK2, CTNNA1, DICER1, DIS3L2, EGFR, EPCAM  . GERD (gastroesophageal reflux disease)   . 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 History of abnormal mammogram   . Hyperlipidemia   . Hypertension   . Irregular heart beat   . Neuromuscular disorder (HCC)    fibromyalgia  . Sleep apnea    cpap some  . SVD (spontaneous vaginal delivery)    x 2  . Tubular adenoma of colon 05/09/12   "innumerable number" of flat polyps    Current Outpatient Medications:  .  atorvastatin (LIPITOR) 40 MG tablet, Take 1 tablet (40 mg total) by mouth daily., Disp: 90 tablet, Rfl: 3 .  benazepril (LOTENSIN) 40 MG tablet, Take 1 tablet by mouth once daily, Disp: 90 tablet, Rfl: 0 .  blood glucose meter kit and supplies KIT, Dispense based on patient and insurance preference. Use up to four times daily as directed. (FOR ICD-9 250.00, 250.01)., Disp: 1 each, Rfl: 10 .  Blood Glucose Monitoring Suppl (LIBERTY BLOOD GLUCOSE MONITOR) DEVI, 1 each by Does not apply route daily. Test 1x per day  and prn-dx250.02, Disp: 1 each, Rfl: 0 .  Cholecalciferol (VITAMIN D3) 5000 units CAPS, Take 5,000 Units by mouth daily., Disp: , Rfl:  .  cyclobenzaprine (FLEXERIL) 5 MG tablet, TAKE 1 TABLET BY MOUTH THREE TIMES DAILY AS NEEDED FOR MUSCLE SPASMS (NEEDS APPOINTMENT), Disp: 30 tablet, Rfl: 0 .  diltiazem (CARDIZEM CD) 120 MG 24 hr capsule, Take 1 capsule by mouth once daily, Disp: 90 capsule, Rfl: 2 .  fluticasone (FLONASE) 50 MCG/ACT nasal spray, Place 1 spray into both nostrils in the morning and at bedtime., Disp: 16 g, Rfl: 6 .  furosemide (LASIX) 40 MG tablet, TAKE 1 TABLET BY MOUTH IN THE MORNING AND 1/2 (ONE-HALF)  TABLET AT  1  PM, Disp: 135 tablet, Rfl: 0 .  glucose blood (ONETOUCH VERIO) test  strip, Check Bs up to 4 times daily Dx E11.9, Disp: 400 each, Rfl: 3 .  glucose blood test strip, Test 1x per day and prn- dx 250.02, Disp: 100 each, Rfl: 2 .  lactulose (CHRONULAC) 10 GM/15ML solution, Take 15 ml three times a day and as needed, Disp: 946 mL, Rfl: 11 .  meclizine (ANTIVERT) 25 MG tablet, Take 1 tablet (25 mg total) by mouth 3 (three) times daily as needed for dizziness., Disp: 30 tablet, Rfl: 0 .  metFORMIN (GLUCOPHAGE) 500 MG tablet, Take 1 tablet (500 mg total) by mouth 2 (two) times daily with a meal., Disp: 180 tablet, Rfl: 3 .  ondansetron (ZOFRAN) 4 MG tablet, Take 1 tablet (4 mg total) by mouth every 8 (eight) hours as needed for nausea or vomiting., Disp: 30 tablet, Rfl: 1 .  ONETOUCH DELICA LANCETS 82U MISC, USE ONE LANCET TO CHECK GLUCOSE ONCE DAILY AND AS NEEDED, Disp: 100 each, Rfl: 0 .  rifaximin (XIFAXAN) 550 MG TABS tablet, Take 1 tablet (550 mg total) by mouth 2 (two) times daily., Disp: 180 tablet, Rfl: 3 .  traMADol (ULTRAM) 50 MG tablet, Take 1 tablet (50 mg total) by mouth every 12 (twelve) hours as needed for moderate pain., Disp: 30 tablet, Rfl: 2 .  LORazepam (ATIVAN) 0.5 MG tablet, Take 0.5-1 tablets (0.25-0.5 mg total) by mouth 2 (two) times daily as needed for anxiety., Disp: 30 tablet, Rfl: 1 Social History   Socioeconomic History  . Marital status: Married    Spouse name: Stacey Summers  . Number of children: 3  . Years of education: 66  . Highest education level: GED or equivalent  Occupational History  . Occupation: retired  Tobacco Use  . Smoking status: Former Smoker    Packs/day: 1.00    Years: 3.00    Pack years: 3.00    Types: Cigarettes    Quit date: 08/15/1969    Years since quitting: 50.9  . Smokeless tobacco: Never Used  Vaping Use  . Vaping Use: Never used  Substance and Sexual Activity  . Alcohol use: No    Alcohol/week: 0.0 standard drinks  . Drug use: No  . Sexual activity: Not Currently  Other Topics Concern  . Not on file    Social History Narrative  . Not on file   Social Determinants of Health   Financial Resource Strain: Low Risk   . Difficulty of Paying Living Expenses: Not hard at all  Food Insecurity: No Food Insecurity  . Worried About Charity fundraiser in the Last Year: Never true  . Ran Out of Food in the Last Year: Never true  Transportation Needs: No Transportation Needs  . Lack of  Transportation (Medical): No  . Lack of Transportation (Non-Medical): No  Physical Activity: Inactive  . Days of Exercise per Week: 0 days  . Minutes of Exercise per Session: 0 min  Stress: No Stress Concern Present  . Feeling of Stress : Only a little  Social Connections: Socially Integrated  . Frequency of Communication with Friends and Family: More than three times a week  . Frequency of Social Gatherings with Friends and Family: More than three times a week  . Attends Religious Services: More than 4 times per year  . Active Member of Clubs or Organizations: Yes  . Attends Archivist Meetings: More than 4 times per year  . Marital Status: Married  Human resources officer Violence: Not At Risk  . Fear of Current or Ex-Partner: No  . Emotionally Abused: No  . Physically Abused: No  . Sexually Abused: No   Family History  Problem Relation Age of Onset  . Prostate cancer Father        d.89 diagnosed in late-60s  . Colon cancer Father        diagnosed at 60  . Heart failure Mother        d.37  . Heart disease Mother   . Colon polyps Brother        polyposis. Underwent partial colectomy at age 40.  Marland Kitchen Spina bifida Brother        died at birth  . Stomach cancer Paternal Aunt        questionable  . Kidney cancer Cousin        d.78s maternal first-cousin  . Other Other 16       multiple lipomas  . Colon cancer Paternal Uncle        in his 14s  . Heart failure Maternal Grandmother        d.45  . Heart failure Maternal Uncle        d.50  . Colon polyps Son 87  . Heart attack Son 33  . Leukemia  Maternal Aunt   . Esophageal cancer Neg Hx   . Rectal cancer Neg Hx     Objective: Office vital signs reviewed. BP 122/63   Pulse 61   Temp (!) 97.5 F (36.4 C)   Ht 5' 2"  (1.575 m)   Wt 211 lb 6.4 oz (95.9 kg)   SpO2 96%   BMI 38.67 kg/m   Physical Examination:  General: Awake, alert, chronically ill-appearing. No acute distress HEENT: Normal, sclera mildly icteric Cardio: regular rate and rhythm, S1S2 heard, no murmurs appreciated Pulm: clear to auscultation bilaterally, no wheezes, rhonchi or rales; normal work of breathing on room air GI: Soft, nontender.  Nondistended.  No appreciable ascites Psych: Mood depressed.  Patient is pleasant and interactive.  Depression screen [facility] 2/9 07/20/2020 03/18/2020 01/31/2020  Decreased Interest 0 0 0  Down, Depressed, Hopeless 0 0 0  PHQ - 2 Score 0 0 0  Altered sleeping - - -  Tired, decreased energy - - -  Change in appetite - - -  Feeling bad or failure about yourself  - - -  Trouble concentrating - - -  Moving slowly or fidgety/restless - - -  Suicidal thoughts - - -  PHQ-9 Score - - -  Some recent data might be hidden    Assessment/ Plan: 74 y.o. female   Type 2 diabetes mellitus with other specified complication, without long-term current use of insulin (HCC) - Plan: Bayer DCA Hb A1c Waived  GAD (generalized anxiety disorder)  Chronic left-sided low back pain with left-sided sciatica - Plan: traMADol (ULTRAM) 50 MG tablet  Impaired mobility and activities of daily living  Liver cirrhosis secondary to NASH ([place]) - Plan: Hepatic Function Panel  Thrombocytopenia ([place]) - Plan: CBC  Blood sugars well controlled.  Continue current regimen.  We discussed that Ativan is much lower risk of a benzodiazepine than Xanax.  We have switched her over to that and I will try and reconvene with her in the next week or 2 to see how she is doing on this  Back pain is not well controlled at this time.  The tramadol does help some but  ultimately does not fix underlying issue.  She is scheduled for another back injection at the end of the month which hopefully will help.  Given her limited mobility secondary to pain and easy fatigability, we will see if we can get an electronic scooter arranged for her.  It sounds like she is fatiguing too much even with walker and cane and would unlikely be able to use a manual wheelchair  Ongoing weight loss, likely secondary to underlying liver disease and side effects from the medication to control her ammonia.  She will continue to follow-up with GI for this.  Check liver function panel, CBC given thrombocytopenia  The Narcotic Database has been reviewed.  There were no red flags.     Orders Placed This Encounter  Procedures  . Bayer DCA Hb A1c Waived   Meds ordered this encounter  Medications  . LORazepam (ATIVAN) 0.5 MG tablet    Sig: Take 0.5-1 tablets (0.25-0.5 mg total) by mouth 2 (two) times daily as needed for anxiety.    Dispense:  30 tablet    Refill:  1  . traMADol (ULTRAM) 50 MG tablet    Sig: Take 1 tablet (50 mg total) by mouth every 12 (twelve) hours as needed for moderate pain.    Dispense:  30 tablet    Refill:  [place], [place] [phone]

## 2020-07-21 LAB — CBC
Hematocrit: 43.1 % (ref 34.0–46.6)
Hemoglobin: 14.7 g/dL (ref 11.1–15.9)
MCH: 32.3 pg (ref 26.6–33.0)
MCHC: 34.1 g/dL (ref 31.5–35.7)
MCV: 95 fL (ref 79–97)
Platelets: 91 10*3/uL — CL (ref 150–450)
RBC: 4.55 x10E6/uL (ref 3.77–5.28)
RDW: 13 % (ref 11.7–15.4)
WBC: 6.9 10*3/uL (ref 3.4–10.8)

## 2020-07-21 LAB — HEPATIC FUNCTION PANEL
ALT: 45 IU/L — ABNORMAL HIGH (ref 0–32)
AST: 73 IU/L — ABNORMAL HIGH (ref 0–40)
Albumin: 3.8 g/dL (ref 3.7–4.7)
Alkaline Phosphatase: 114 IU/L (ref 44–121)
Bilirubin Total: 2.5 mg/dL — ABNORMAL HIGH (ref 0.0–1.2)
Bilirubin, Direct: 0.7 mg/dL — ABNORMAL HIGH (ref 0.00–0.40)
Total Protein: 5.8 g/dL — ABNORMAL LOW (ref 6.0–8.5)

## 2020-07-22 ENCOUNTER — Telehealth: Payer: Self-pay

## 2020-07-22 NOTE — Telephone Encounter (Signed)
Pt's daughter [name] is requesting a call back from a nurse to discuss the pt's condition, caller did not disclose any further information

## 2020-07-22 NOTE — Telephone Encounter (Signed)
Spoke with Stacey Summers.  She and the spouse of the patient are trying to get a better understanding of "how long" the patient has before her liver disease causes her death. She wants to know where she is in the life expectancy of cirrhosis. I deferred to you on this.  We spoke of the realistic goals when caring for a chronically ill loved one, quality of life, being comfortable, feeling useful and safe.  They will be coming in on 08/05/20 together. She does say that the patient has been experiencing more nausea than she used to. She does have Zofran and she does take it when she has become nauseated.

## 2020-07-22 NOTE — Telephone Encounter (Signed)
Resent the application to her new email provided

## 2020-07-22 NOTE — Telephone Encounter (Signed)
Pt's daughter [name] is requesting for the nurse to send the application to another email (she is having issues with her current email)   New email: [email or web address]

## 2020-07-22 NOTE — Telephone Encounter (Signed)
Requesting a return call.

## 2020-07-22 NOTE — Telephone Encounter (Signed)
Its hard to predict life expectancy, cirrhosis is chronic liver disease. Please schedule follow up visit next available.

## 2020-08-03 ENCOUNTER — Other Ambulatory Visit: Payer: Self-pay | Admitting: Gastroenterology

## 2020-08-04 ENCOUNTER — Other Ambulatory Visit: Payer: Self-pay

## 2020-08-04 ENCOUNTER — Ambulatory Visit (INDEPENDENT_AMBULATORY_CARE_PROVIDER_SITE_OTHER): Payer: PPO | Admitting: Family

## 2020-08-04 ENCOUNTER — Ambulatory Visit (INDEPENDENT_AMBULATORY_CARE_PROVIDER_SITE_OTHER): Payer: PPO

## 2020-08-04 ENCOUNTER — Telehealth: Payer: Self-pay

## 2020-08-04 ENCOUNTER — Encounter: Payer: Self-pay | Admitting: Family

## 2020-08-04 ENCOUNTER — Telehealth: Payer: Self-pay | Admitting: Sports Medicine

## 2020-08-04 VITALS — BP 132/71 | HR 70 | Temp 97.5°F | Ht 62.0 in | Wt 216.0 lb

## 2020-08-04 DIAGNOSIS — S43032A Inferior subluxation of left humerus, initial encounter: Secondary | ICD-10-CM

## 2020-08-04 DIAGNOSIS — M1711 Unilateral primary osteoarthritis, right knee: Secondary | ICD-10-CM | POA: Diagnosis not present

## 2020-08-04 DIAGNOSIS — W19XXXA Unspecified fall, initial encounter: Secondary | ICD-10-CM

## 2020-08-04 DIAGNOSIS — G8929 Other chronic pain: Secondary | ICD-10-CM

## 2020-08-04 DIAGNOSIS — M25562 Pain in left knee: Secondary | ICD-10-CM | POA: Diagnosis not present

## 2020-08-04 DIAGNOSIS — Z789 Other specified health status: Secondary | ICD-10-CM

## 2020-08-04 DIAGNOSIS — M25512 Pain in left shoulder: Secondary | ICD-10-CM | POA: Diagnosis not present

## 2020-08-04 DIAGNOSIS — S43082A Other subluxation of left shoulder joint, initial encounter: Secondary | ICD-10-CM | POA: Diagnosis not present

## 2020-08-04 DIAGNOSIS — S43015A Anterior dislocation of left humerus, initial encounter: Secondary | ICD-10-CM

## 2020-08-04 DIAGNOSIS — R296 Repeated falls: Secondary | ICD-10-CM

## 2020-08-04 NOTE — Telephone Encounter (Signed)
They will check with insurance as far as to what company, ie: [employer], [employer] to send order for scooter to

## 2020-08-04 NOTE — Telephone Encounter (Signed)
Daughter Stacey Summers is calling to checkup on ppw for electric scooter. Per [facility] --she would look in to this for [name] instructed pt to call the office in a week if pt had not heard from her. Please call back.  Daughter also has wants to discuss what she was told today by ortho and med center. Ortho will not take her--see Courtney's referral note. Med center is saying that pt does not have a dislocated shoulder.   Please call back

## 2020-08-04 NOTE — Progress Notes (Signed)
Subjective:    Patient ID: Stacey Summers, female    DOB: Oct 13, 1945, 74 y.o.   MRN: [id number]  Chief Complaint  Patient presents with   [place] last night over presents. Hurt left shoulder no bruising or swelling. Patient taking to xray first.   Pt presents to the office today after a fall. She reports last night she fell over some presents and landed on her left shoulder and left knee.  Fall The accident occurred 6 to 12 hours ago. She landed on hard floor. There was no blood loss. The point of impact was the left shoulder and left knee. The pain is present in the left shoulder. The pain is at a severity of 9/10. The pain is moderate. The symptoms are aggravated by movement. Pertinent negatives include no hematuria, loss of consciousness, numbness, tingling or visual change. She has tried rest (Ultram) for the symptoms. The treatment provided mild relief.      Review of Systems  Genitourinary: Negative for hematuria.  Neurological: Negative for tingling, loss of consciousness and numbness.  All other systems reviewed and are negative.      Objective:   Physical Exam Vitals reviewed.  Constitutional:      General: She is not in acute distress.    Appearance: She is well-developed and well-nourished.  HENT:     Head: Normocephalic and atraumatic.     Mouth/Throat:     Mouth: Oropharynx is clear and moist.  Eyes:     Pupils: Pupils are equal, round, and reactive to light.  Neck:     Thyroid: No thyromegaly.  Cardiovascular:     Rate and Rhythm: Normal rate and regular rhythm.     Pulses: Intact distal pulses.     Heart sounds: Normal heart sounds. No murmur heard.   Pulmonary:     Effort: Pulmonary effort is normal. No respiratory distress.     Breath sounds: Normal breath sounds. No wheezing.  Abdominal:     General: Bowel sounds are normal. There is no distension.     Palpations: Abdomen is soft.     Tenderness: There is no abdominal tenderness.   Musculoskeletal:        General: Tenderness and deformity present.     Cervical back: Normal range of motion and neck supple.     Left lower leg: Edema (mild) present.     Comments: Unable to abduct left shoulder   Skin:    General: Skin is warm and dry.  Neurological:     Mental Status: She is alert and oriented to person, place, and time.     Cranial Nerves: No cranial nerve deficit.     Deep Tendon Reflexes: Reflexes are normal and symmetric.  Psychiatric:        Mood and Affect: Mood and affect normal.        Behavior: Behavior normal.        Thought Content: Thought content normal.        Judgment: Judgment normal.      BP 132/71    Pulse 70    Temp (!) 97.5 F (36.4 C) (Temporal)    Ht 5' 2"  (1.575 m)    Wt 216 lb (98 kg)    BMI 39.51 kg/m      Assessment & Plan:  EDUARDO WURTH comes in today with chief complaint of Fall [place] last night over presents. Hurt left shoulder no bruising or swelling. Patient taking to xray  first.)   Diagnosis and orders addressed:  1. Fall, initial encounter - DG Shoulder Left; Future - Ambulatory referral to Orthopedic Surgery  2. Anterior dislocation of left shoulder, initial encounter - Ambulatory referral to Orthopedic Surgery  3. Acute pain of left knee - DG Knee 1-2 Views Left; Future  4. Inferior subluxation of left humerus, initial encounter   Discussed with Ortho, they do not have medication for patient at this time and recommend her going to ED. Granddaughter is very anxious at this time because of COVID. However, we discussed she needs to go to have it set.   Evelina Dun, FNP

## 2020-08-04 NOTE — Telephone Encounter (Signed)
I received a call that this patient had fallen and dislocated her shoulder, I did review the x-rays myself, it looks like she simply has a bit of inferior subluxation but no overt dislocation, I do not see any obvious fractures, she does not need an emergent visit with me, we can see her on a nonurgent basis, she can wear a sling in the meantime.  Discussed the case with primary treating provider Evelina Dun, NP.   ___________________________________________ Gwen Her. Dianah Field, M.D., ABFM., CAQSM. Primary Care and [place] Instructor of [place] [facility]

## 2020-08-04 NOTE — Telephone Encounter (Signed)
Please advise, thank you.

## 2020-08-04 NOTE — Patient Instructions (Signed)
Shoulder Dislocation  A shoulder dislocation happens when the upper arm bone (humerus) moves out of the shoulder joint. The shoulder joint is the part of the shoulder where the humerus, shoulder blade (scapula), and collarbone (clavicle) meet. What are the causes? This condition is often caused by:  A fall.  A hard, direct hit to the shoulder.  A forceful movement of the shoulder. What increases the risk? You are more likely to develop this condition if you play sports. What are the signs or symptoms? Symptoms of this condition include:  Deformity of the shoulder.  Severe pain.  Inability to move the shoulder.  Numbness, weakness, or tingling in your neck or down your arm.  Bruising or swelling around your shoulder. How is this diagnosed? This condition is diagnosed with a physical exam. After the exam, tests may be done to check for related problems. Tests may include:  X-ray. This may be done to check for broken bones.  MRI. This may be done to check for damage to the tissues around the shoulder.  Electromyogram. This may be done to check for nerve damage. How is this treated? This condition is treated with a procedure to place the humerus back in the joint. This procedure is called a reduction. There are two types of reduction:  Closed reduction. The humerus is placed back in the joint without surgery. The health care provider uses his or her hands to guide the bone back into place.  Open reduction. Surgery is done to place the humerus back in the joint. An open reduction may be recommended if: ? You have a weak shoulder joint or weak ligaments. ? You have had more than one shoulder dislocation. ? The nerves or blood vessels around your shoulder have been damaged. After reduction, your shoulder and arm will be placed in a brace or sling to prevent it from moving. After the brace or sling is removed, you will have physical therapy to help improve the range of motion in  your shoulder joint. Follow these instructions at home: Medicines  Take over-the-counter and prescription medicines only as told by your health care provider.  Ask your health care provider if the medicine prescribed to you: ? Requires you to avoid driving or using heavy machinery. ? Can cause constipation. You may need to take these actions to prevent or treat constipation:  Drink enough fluid to keep your urine pale yellow.  Take over-the-counter or prescription medicines.  Eat foods that are high in fiber, such as beans, whole grains, and fresh fruits and vegetables.  Limit foods that are high in fat and processed sugars, such as fried or sweet foods. If you have a brace or sling:  Wear the brace or sling as told by your health care provider. Remove it only as told by your health care provider.  Loosen the brace or sling if your fingers tingle, become numb, or turn cold and blue.  Keep the brace or sling clean.  If the brace or sling is not waterproof: ? Do not let it get wet. ? Cover it with a watertight covering when you take a bath or shower. Managing pain, stiffness, and swelling   If directed, put ice on the injured area. ? If you have a removable brace or sling, remove it as told by your health care provider. ? Put ice in a plastic bag. ? Place a towel between your skin and the bag. ? Leave the ice on for 20 minutes, 2-3 times per  day.  Move your fingers often to reduce stiffness and swelling.  Raise (elevate) the injured area above the level of your heart while you are sitting or lying down. Activity  Do not lift your arm above shoulder level until your health care provider approves.  Do not lift anything until your health care provider says that it is safe.  Do not push or pull things until your health care provider approves.  Return to your normal activities as told by your health care provider. Ask your health care provider what activities are safe for  you.  Perform range-of-motion exercises only as told by your health care provider.  Exercise your hand by squeezing a soft ball. This helps to decrease stiffness and swelling in your hand and wrist. General instructions  Do not drive while wearing a brace or sling on a hand that you use for driving. Ask your health care provider when it is safe to drive after the brace or sling is removed.  Do not take baths, swim, or use a hot tub until your health care provider approves. Ask your health care provider if you may take showers. You may only be allowed to take sponge baths.  Do not use any products that contain nicotine or tobacco, such as cigarettes, e-cigarettes, and chewing tobacco. These can delay healing. If you need help quitting, ask your health care provider.  Keep all follow-up visits as told by your health care provider. This is important. Contact a health care provider if:  Your brace or sling gets damaged. Get help right away if:  Your pain gets worse rather than better.  You lose feeling in your arm or hand.  Your arm or hand becomes white and cold. Summary  A shoulder dislocation happens when the upper arm bone moves out of the shoulder joint.  It is usually caused by a fall, a strong hit to the shoulder, or a forceful movement of the shoulder.  It causes severe pain, inability to move the shoulder, numbness, weakness, or tingling.  This condition is treated with either closed or open reduction. You will also be given a brace or sling. You will do exercises to increase your shoulder's range of motion.  Contact a health care provider if your brace or sling gets damaged. Get help right away if your pain gets worse, you lose feeling in your arm or hand, or your arm or hand becomes white or cold. This information is not intended to replace advice given to you by your health care provider. Make sure you discuss any questions you have with your health care provider. Document  Revised: 02/28/2018 Document Reviewed: 02/28/2018 Elsevier Patient Education  [place].

## 2020-08-05 ENCOUNTER — Other Ambulatory Visit (INDEPENDENT_AMBULATORY_CARE_PROVIDER_SITE_OTHER): Payer: PPO

## 2020-08-05 ENCOUNTER — Ambulatory Visit: Payer: PPO | Admitting: Gastroenterology

## 2020-08-05 ENCOUNTER — Encounter: Payer: Self-pay | Admitting: Gastroenterology

## 2020-08-05 VITALS — BP 110/60 | HR 56 | Ht 62.0 in | Wt 218.4 lb

## 2020-08-05 DIAGNOSIS — Z23 Encounter for immunization: Secondary | ICD-10-CM

## 2020-08-05 DIAGNOSIS — K7581 Nonalcoholic steatohepatitis (NASH): Secondary | ICD-10-CM

## 2020-08-05 DIAGNOSIS — K7460 Unspecified cirrhosis of liver: Secondary | ICD-10-CM

## 2020-08-05 DIAGNOSIS — K7682 Hepatic encephalopathy: Secondary | ICD-10-CM

## 2020-08-05 DIAGNOSIS — K7290 Hepatic failure, unspecified without coma: Secondary | ICD-10-CM

## 2020-08-05 DIAGNOSIS — D696 Thrombocytopenia, unspecified: Secondary | ICD-10-CM | POA: Diagnosis not present

## 2020-08-05 LAB — AMMONIA: Ammonia: 22 umol/L (ref 11–35)

## 2020-08-05 NOTE — Patient Instructions (Addendum)
Your provider has requested that you go to the basement level for lab work before leaving today. Press "B" on the elevator. The lab is located at the first door on the left as you exit the elevator.  You have been scheduled for a RUQ abdominal ultrasound at [facility] (1st floor of hospital) on 08/20/20 at 8:00 am. Please arrive 15 minutes prior to your appointment for registration. Make certain not to have anything to eat or drink 6 hours prior to your appointment. Should you need to reschedule your appointment, please contact radiology at [phone]. This test typically takes about 30 minutes to perform.  Please follow up with Dr Silverio Decamp in 3 months.  We have given you your flu shot today.  If you are age 72 or older, your body mass index should be between 23-30. Your Body mass index is 39.95 kg/m. If this is out of the aforementioned range listed, please consider follow up with your Primary Care Provider.  If you are age 103 or younger, your body mass index should be between 19-25. Your Body mass index is 39.95 kg/m. If this is out of the aformentioned range listed, please consider follow up with your Primary Care Provider.    Due to recent changes in healthcare laws, you may see the results of your imaging and laboratory studies on MyChart before your provider has had a chance to review them.  We understand that in some cases there may be results that are confusing or concerning to you. Not all laboratory results come back in the same time frame and the provider may be waiting for multiple results in order to interpret others.  Please give [place] 48 hours in order for your provider to thoroughly review all the results before contacting the office for clarification of your results.

## 2020-08-05 NOTE — Progress Notes (Signed)
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  She continues to have intermittent episodes of confusion, mood changes and changes in sleep pattern.  She tends to sleep during daytime as she has difficulty falling asleep at night.  According to patient she sleeps during the day because she is bored and there is nothing else to do . She is having decreased energy, feels more fatigued.  Her daughter is worried about her overall clinical status though patient feels she has some good days and some bad days. Denies any worsening confusion, abdominal distention, melena or rectal bleeding. No vomiting, abdominal bloating or weight gain.  Relevant history: History of COVID-19 infection November 2020.Worsening mental status with confusion and somnolence post Covid.  She has prominent splenorenal shunt, was referred to IR to discuss possible embolization. But given she has significant improvement of symptoms with Xifaxan twice daily, family and patient preferredto hold off the embolization.     Outpatient Encounter Medications as of 08/05/2020  Medication Sig  . atorvastatin (LIPITOR) 40 MG tablet Take 1 tablet (40 mg total) by mouth daily.  . benazepril (LOTENSIN) 40 MG tablet Take 1 tablet by mouth once daily  . blood glucose meter kit and supplies KIT Dispense based on patient and insurance preference. Use up to four times daily as directed. (FOR ICD-9 250.00, 250.01).  . Blood Glucose Monitoring Suppl (LIBERTY BLOOD GLUCOSE MONITOR) DEVI 1 each by Does not apply route daily. Test 1x per day and prn-dx250.02  . Cholecalciferol (VITAMIN D3)  5000 units CAPS Take 5,000 Units by mouth daily.  . cyclobenzaprine (FLEXERIL) 5 MG tablet TAKE 1 TABLET BY MOUTH THREE TIMES DAILY AS NEEDED FOR MUSCLE SPASMS (NEEDS APPOINTMENT)  . diltiazem (CARDIZEM CD) 120 MG 24 hr capsule Take 1 capsule by mouth once daily  . fluticasone (FLONASE) 50 MCG/ACT nasal spray Place 1 spray into both nostrils in the morning and at bedtime.  . furosemide (LASIX) 40 MG tablet TAKE 1 TABLET BY MOUTH IN THE MORNING AND 1/2 (ONE-HALF)  TABLET AT  1  PM  . glucose blood (ONETOUCH VERIO) test strip Check Bs up to 4 times daily Dx E11.9  . glucose blood test strip Test 1x per day and prn- dx 250.02  . lactulose (CHRONULAC) 10 GM/15ML solution Take 15 ml three times a day and as needed  . LORazepam (ATIVAN) 0.5 MG tablet Take 0.5-1 tablets (0.25-0.5 mg total) by mouth 2 (two) times daily as needed for anxiety.  . meclizine (ANTIVERT) 25 MG tablet Take 1 tablet (25 mg total) by mouth 3 (three) times daily as needed for dizziness.  . metFORMIN (GLUCOPHAGE) 500 MG tablet Take 1 tablet (500 mg total) by mouth 2 (two) times daily with a meal.  . ondansetron (ZOFRAN) 4 MG tablet Take 1 tablet (4 mg total) by mouth every 8 (eight) hours as needed for nausea or vomiting.  Glory Rosebush DELICA LANCETS 87O MISC USE ONE LANCET TO CHECK GLUCOSE ONCE DAILY AND AS NEEDED  . traMADol (ULTRAM) 50 MG tablet Take 1 tablet (50 mg total) by mouth every 12 (twelve) hours as needed for moderate pain.  Marland Kitchen  XIFAXAN 550 MG TABS tablet Take 1 tablet by mouth twice daily   No facility-administered encounter medications on file as of 08/05/2020.    Allergies as of 08/05/2020 - Review Complete 08/05/2020  Allergen Reaction Noted  . Latex  03/27/2018    Past Medical History:  Diagnosis Date  . Allergy    seasonal  . Anxiety   . Arthritis    hands, back   . Cataract    surgery to remove  . Chronic back pain    lower back   . Cirrhosis of liver without ascites ([place])    recent dx 07/11/18  .  COPD (chronic obstructive pulmonary disease) (HCC)    no inhaler  . Diabetes mellitus without complication ([place])   . DJD (degenerative joint disease)   . Fibromyalgia   . Genetic testing 03/08/2017   [name] underwent genetic counseling and testing for hereditary cancer syndromes on 02/21/2017. Her results were negative for mutations in all 83 genes analyzed by [facility]'s 83-gene Multi-Cancers Panel. Genes analyzed include: ALK, APC, ATM, AXIN2, BAP1, BARD1, BLM, BMPR1A, BRCA1, BRCA2, BRIP1, CASR, CDC73, CDH1, CDK4, CDKN1B, CDKN1C, CDKN2A, CEBPA, CHEK2, CTNNA1, DICER1, DIS3L2, EGFR, EPCAM  . GERD (gastroesophageal reflux disease)   . 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 History of abnormal mammogram   . Hyperlipidemia   . Hypertension   . Irregular heart beat   . Neuromuscular disorder (HCC)    fibromyalgia  . Sleep apnea    cpap some  . SVD (spontaneous vaginal delivery)    x 2  . Tubular adenoma of colon 05/09/12   "innumerable number" of flat polyps    Past Surgical History:  Procedure Laterality Date  . CARPAL TUNNEL RELEASE Left 10/20/14   [facility]  . CATARACT EXTRACTION, BILATERAL    . [place]  . CHOLECYSTECTOMY  1990  . COLONOSCOPY    . IR RADIOLOGIST EVAL & MGMT  07/16/2019  . KNEE ARTHROSCOPY WITH SUBCHONDROPLASTY Right 03/27/2018   Procedure: Right knee arthroscopic partial medial meniscus repair versus menisectomy with medial tibia subchondroplasty;  Surgeon: Nicholes Stairs, MD;  Location: [facility];  Service: Orthopedics;  Laterality: Right;  120 mins  . LARYNX SURGERY     POLYP REMOVAL  . MULTIPLE TOOTH EXTRACTIONS    . POLYPECTOMY     vocal cord  . TONSILLECTOMY  1965  . TUBAL LIGATION    . TUMOR REMOVAL     benign fatty tumors on abdomen  . UPPER GASTROINTESTINAL ENDOSCOPY      Family History  Problem Relation Age of Onset  . Prostate cancer Father        d.89 diagnosed in late-60s  . Colon cancer Father        diagnosed  at 83  . Heart failure Mother        d.37  . Heart disease Mother   . Colon polyps Brother        polyposis. Underwent partial colectomy at age 19.  Marland Kitchen Spina bifida Brother        died at birth  . Stomach cancer Paternal Aunt        questionable  . Kidney cancer Cousin        d.78s maternal first-cousin  . Other Other 16       multiple lipomas  . Colon cancer Paternal Uncle        in his 85s  . Heart failure Maternal Grandmother  d.36  . Heart failure Maternal Uncle        d.50  . Colon polyps Son 50  . Heart attack Son 32  . Leukemia Maternal Aunt   . Esophageal cancer Neg Hx   . Rectal cancer Neg Hx     Social History   Socioeconomic History  . Marital status: Married    Spouse name: [name]  . Number of children: 3  . Years of education: 20  . Highest education level: GED or equivalent  Occupational History  . Occupation: retired  Tobacco Use  . Smoking status: Former Smoker    Packs/day: 1.00    Years: 3.00    Pack years: 3.00    Types: Cigarettes    Quit date: 08/15/1969    Years since quitting: 51.0  . Smokeless tobacco: Never Used  Vaping Use  . Vaping Use: Never used  Substance and Sexual Activity  . Alcohol use: No    Alcohol/week: 0.0 standard drinks  . Drug use: No  . Sexual activity: Not Currently  Other Topics Concern  . Not on file  Social History Narrative  . Not on file   Social Determinants of Health   Financial Resource Strain: Low Risk   . Difficulty of Paying Living Expenses: Not hard at all  Food Insecurity: No Food Insecurity  . Worried About Charity fundraiser in the Last Year: Never true  . Ran Out of Food in the Last Year: Never true  Transportation Needs: No Transportation Needs  . Lack of Transportation (Medical): No  . Lack of Transportation (Non-Medical): No  Physical Activity: Inactive  . Days of Exercise per Week: 0 days  . Minutes of Exercise per Session: 0 min  Stress: No Stress Concern Present  . Feeling of  Stress : Only a little  Social Connections: Socially Integrated  . Frequency of Communication with Friends and Family: More than three times a week  . Frequency of Social Gatherings with Friends and Family: More than three times a week  . Attends Religious Services: More than 4 times per year  . Active Member of Clubs or Organizations: Yes  . Attends Archivist Meetings: More than 4 times per year  . Marital Status: Married  Human resources officer Violence: Not At Risk  . Fear of Current or Ex-Partner: No  . Emotionally Abused: No  . Physically Abused: No  . Sexually Abused: No      Review of systems: All other review of systems negative except as mentioned in the HPI.   Physical Exam: Vitals:   08/05/20 1518  BP: 110/60  Pulse: (!) 56   Body mass index is 39.95 kg/m. Gen:      No acute distress HEENT:  sclera anicteric Abd:      soft, non-tender; no palpable masses, no distension Ext:    No edema Neuro: alert and oriented x 3 Psych: normal mood and affect  Data Reviewed:  Reviewed labs, radiology imaging, old records and pertinent past GI work up   Assessment and Plan/Recommendations:  74 year old very pleasant with history of Stacey Summers cirrhosis decompensated with hepatic encephalopathy  Overall clinical status is stable Meld score remains low  Follow-up PT INR and ammonia level  Due for hepatocellular carcinoma screening: Check abdominal ultrasound and AFP level  Chronic thrombocytopenia: Secondary to portal hypertension in the setting of [place] cirrhosis  Volume status: Stable  Hepatic encephalopathy: Continue Xifaxan twice daily and lactulose as needed with goal 3 soft bowel  movements daily  Small esophageal varices on EGD in January 2021  Continue with low-carb and low-fat diet, avoid artificial sweeteners, high fructose corn syrup. Daily exercise as tolerated  Return in 3 to 4 months or sooner if needed  This visit required 45 minutes of patient  care (this includes precharting, chart review, review of results, face-to-face time used for counseling as well as treatment plan and follow-up. The patient was provided an opportunity to ask questions and all were answered. The patient agreed with the plan and demonstrated an understanding of the instructions.  Damaris Hippo , MD    CC: Janora Norlander, DO

## 2020-08-05 NOTE — Telephone Encounter (Signed)
Daughter is calling to give who patient can get scooter from [facility] in [place] P# [phone] and other one is [facility] P# [phone]. Insurance covers 80% and out of pocket is 20%.

## 2020-08-06 LAB — AFP TUMOR MARKER: AFP, Serum, Tumor Marker: 4.8 ng/mL (ref 0.0–8.3)

## 2020-08-06 LAB — PROTIME-INR
INR: 1.3 ratio — ABNORMAL HIGH (ref 0.8–1.0)
Prothrombin Time: 14.3 s — ABNORMAL HIGH (ref 9.6–13.1)

## 2020-08-06 NOTE — Telephone Encounter (Signed)
Please add information in comments to DME order for scooter to be faxed to [employer]

## 2020-08-10 NOTE — Telephone Encounter (Signed)
Order is in [place] place on your desk

## 2020-08-11 DIAGNOSIS — Z6841 Body Mass Index (BMI) 40.0 and over, adult: Secondary | ICD-10-CM | POA: Diagnosis not present

## 2020-08-11 DIAGNOSIS — M48062 Spinal stenosis, lumbar region with neurogenic claudication: Secondary | ICD-10-CM | POA: Diagnosis not present

## 2020-08-12 ENCOUNTER — Other Ambulatory Visit: Payer: Self-pay

## 2020-08-12 ENCOUNTER — Ambulatory Visit (INDEPENDENT_AMBULATORY_CARE_PROVIDER_SITE_OTHER): Payer: PPO | Admitting: Sports Medicine

## 2020-08-12 ENCOUNTER — Ambulatory Visit (INDEPENDENT_AMBULATORY_CARE_PROVIDER_SITE_OTHER): Payer: PPO

## 2020-08-12 DIAGNOSIS — M25512 Pain in left shoulder: Secondary | ICD-10-CM | POA: Diagnosis not present

## 2020-08-12 DIAGNOSIS — M25562 Pain in left knee: Secondary | ICD-10-CM

## 2020-08-12 DIAGNOSIS — M1711 Unilateral primary osteoarthritis, right knee: Secondary | ICD-10-CM | POA: Diagnosis not present

## 2020-08-12 MED ORDER — OXYCODONE HCL 10 MG PO TABS: 10.0000 mg | ORAL_TABLET | Freq: Three times a day (TID) | ORAL | 0 refills | Status: DC | PRN

## 2020-08-12 NOTE — Assessment & Plan Note (Signed)
Also has significant pain and bruising in the left knee, no tenderness in the calf. There is baseline osteoarthritis, repeating x-rays, though I think most of her pain is simply from the contusion.

## 2020-08-12 NOTE — Progress Notes (Signed)
    Procedures performed today:    None.  Independent interpretation of notes and tests performed by another provider:   None.  Brief History, Exam, Impression, and Recommendations:    Left shoulder pain After a recent fall last week, initially suspected dislocated shoulder by primary care, noted shoulder was simply subluxed, she has good passive motion today. I do think she is injured her rotator cuff, increasing to 10 mg oxycodone 3 times daily, repeating x-rays and adding an MRI due to severe weakness, positive Neer's, Hawkins, empty can signs. Muscle going to add some physical therapy in [place]. Return to see me in 4 weeks.  Left knee pain Also has significant pain and bruising in the left knee, no tenderness in the calf. There is baseline osteoarthritis, repeating x-rays, though I think most of her pain is simply from the contusion.    ___________________________________________ Gwen Her. Dianah Field, M.D., ABFM., CAQSM. Primary Care and [place] Instructor of [place] [facility]

## 2020-08-12 NOTE — Assessment & Plan Note (Signed)
After a recent fall last week, initially suspected dislocated shoulder by primary care, noted shoulder was simply subluxed, she has good passive motion today. I do think she is injured her rotator cuff, increasing to 10 mg oxycodone 3 times daily, repeating x-rays and adding an MRI due to severe weakness, positive Neer's, Hawkins, empty can signs. Muscle going to add some physical therapy in [place]. Return to see me in 4 weeks.

## 2020-08-14 ENCOUNTER — Encounter: Payer: Self-pay | Admitting: Gastroenterology

## 2020-08-17 ENCOUNTER — Other Ambulatory Visit: Payer: PPO

## 2020-08-17 NOTE — Telephone Encounter (Signed)
Patient brought in paperwork, Carla Drape was working with Dr Silverio Decamp that day on 12/22 and she submitted all the paperwork

## 2020-08-18 ENCOUNTER — Telehealth: Payer: Self-pay | Admitting: Gastroenterology

## 2020-08-18 NOTE — Telephone Encounter (Signed)
Pt's daughter [name] is requesting a call back from a nurse to discuss the pt's labs

## 2020-08-19 NOTE — Telephone Encounter (Signed)
ppw was all printed out and ready when I went out sick last Monday or Tuesday. Called [employer] today, it was not received. Confirmed fax # [phone], refaxed. Pt aware

## 2020-08-20 ENCOUNTER — Ambulatory Visit (HOSPITAL_COMMUNITY)
Admission: RE | Admit: 2020-08-20 | Discharge: 2020-08-20 | Disposition: A | Discharge status: Home / Self Care | Payer: PPO | Source: Ambulatory Visit | Attending: Gastroenterology | Admitting: Gastroenterology

## 2020-08-20 ENCOUNTER — Other Ambulatory Visit: Payer: Self-pay

## 2020-08-20 DIAGNOSIS — K7290 Hepatic failure, unspecified without coma: Secondary | ICD-10-CM | POA: Insufficient documentation

## 2020-08-20 DIAGNOSIS — K7682 Hepatic encephalopathy: Secondary | ICD-10-CM

## 2020-08-20 DIAGNOSIS — K7460 Unspecified cirrhosis of liver: Secondary | ICD-10-CM | POA: Diagnosis not present

## 2020-08-20 DIAGNOSIS — K7581 Nonalcoholic steatohepatitis (NASH): Secondary | ICD-10-CM | POA: Diagnosis not present

## 2020-08-21 NOTE — Telephone Encounter (Signed)
Spoke with the pt this morning and discussed [place] results.  All questions answered

## 2020-08-22 ENCOUNTER — Other Ambulatory Visit: Payer: Self-pay

## 2020-08-22 ENCOUNTER — Ambulatory Visit (INDEPENDENT_AMBULATORY_CARE_PROVIDER_SITE_OTHER): Payer: PPO

## 2020-08-22 DIAGNOSIS — S46012A Strain of muscle(s) and tendon(s) of the rotator cuff of left shoulder, initial encounter: Secondary | ICD-10-CM | POA: Diagnosis not present

## 2020-08-22 DIAGNOSIS — S42295A Other nondisplaced fracture of upper end of left humerus, initial encounter for closed fracture: Secondary | ICD-10-CM | POA: Diagnosis not present

## 2020-08-22 DIAGNOSIS — W19XXXA Unspecified fall, initial encounter: Secondary | ICD-10-CM | POA: Diagnosis not present

## 2020-08-22 DIAGNOSIS — M75122 Complete rotator cuff tear or rupture of left shoulder, not specified as traumatic: Secondary | ICD-10-CM | POA: Diagnosis not present

## 2020-08-22 DIAGNOSIS — S42215A Unspecified nondisplaced fracture of surgical neck of left humerus, initial encounter for closed fracture: Secondary | ICD-10-CM | POA: Diagnosis not present

## 2020-08-25 ENCOUNTER — Telehealth: Payer: Self-pay

## 2020-08-25 NOTE — Telephone Encounter (Signed)
Faxed referral to [facility] at [phone] for 2nd opinion for family, on patient's disease process. Sent MyChart message about the referral and that they would be contacting them for an appt.

## 2020-08-26 NOTE — Telephone Encounter (Signed)
Records faxed today as requested today by [facility]

## 2020-08-27 ENCOUNTER — Inpatient Hospital Stay (HOSPITAL_COMMUNITY): Payer: PPO | Attending: Hematology

## 2020-08-27 ENCOUNTER — Other Ambulatory Visit: Payer: Self-pay

## 2020-08-27 DIAGNOSIS — D696 Thrombocytopenia, unspecified: Secondary | ICD-10-CM | POA: Diagnosis not present

## 2020-08-27 LAB — CBC WITH DIFFERENTIAL/PLATELET
Abs Immature Granulocytes: 0.02 10*3/uL (ref 0.00–0.07)
Basophils Absolute: 0 10*3/uL (ref 0.0–0.1)
Basophils Relative: 0 %
Eosinophils Absolute: 0.1 10*3/uL (ref 0.0–0.5)
Eosinophils Relative: 3 %
HCT: 39.6 % (ref 36.0–46.0)
Hemoglobin: 13.1 g/dL (ref 12.0–15.0)
Immature Granulocytes: 0 %
Lymphocytes Relative: 30 %
Lymphs Abs: 1.7 10*3/uL (ref 0.7–4.0)
MCH: 33.6 pg (ref 26.0–34.0)
MCHC: 33.1 g/dL (ref 30.0–36.0)
MCV: 101.5 fL — ABNORMAL HIGH (ref 80.0–100.0)
Monocytes Absolute: 0.5 10*3/uL (ref 0.1–1.0)
Monocytes Relative: 9 %
Neutro Abs: 3.3 10*3/uL (ref 1.7–7.7)
Neutrophils Relative %: 58 %
Platelets: 84 10*3/uL — ABNORMAL LOW (ref 150–400)
RBC: 3.9 MIL/uL (ref 3.87–5.11)
RDW: 13.8 % (ref 11.5–15.5)
WBC: 5.7 10*3/uL (ref 4.0–10.5)
nRBC: 0 % (ref 0.0–0.2)

## 2020-08-27 LAB — LACTATE DEHYDROGENASE: LDH: 181 U/L (ref 98–192)

## 2020-08-27 LAB — COMPREHENSIVE METABOLIC PANEL
ALT: 34 U/L (ref 0–44)
AST: 60 U/L — ABNORMAL HIGH (ref 15–41)
Albumin: 3 g/dL — ABNORMAL LOW (ref 3.5–5.0)
Alkaline Phosphatase: 120 U/L (ref 38–126)
Anion gap: 6 (ref 5–15)
BUN: 26 mg/dL — ABNORMAL HIGH (ref 8–23)
CO2: 30 mmol/L (ref 22–32)
Calcium: 9 mg/dL (ref 8.9–10.3)
Chloride: 104 mmol/L (ref 98–111)
Creatinine, Ser: 0.9 mg/dL (ref 0.44–1.00)
GFR, Estimated: >60 mL/min (ref >60)
Glucose, Bld: 176 mg/dL — ABNORMAL HIGH (ref 70–99)
Potassium: 4.2 mmol/L (ref 3.5–5.1)
Sodium: 140 mmol/L (ref 135–145)
Total Bilirubin: 2.5 mg/dL — ABNORMAL HIGH (ref 0.3–1.2)
Total Protein: 5.7 g/dL — ABNORMAL LOW (ref 6.5–8.1)

## 2020-08-27 LAB — VITAMIN D 25 HYDROXY (VIT D DEFICIENCY, FRACTURES): Vit D, 25-Hydroxy: 59.76 ng/mL (ref 30–100)

## 2020-08-27 LAB — VITAMIN B12: Vitamin B-12: 351 pg/mL (ref 180–914)

## 2020-08-31 ENCOUNTER — Ambulatory Visit: Payer: PPO | Admitting: Physical Therapy

## 2020-09-01 NOTE — Telephone Encounter (Signed)
Shirlean Mylar has sent information for patient assistance, I am not sure if we heard back yet. Thanks

## 2020-09-03 ENCOUNTER — Ambulatory Visit (HOSPITAL_COMMUNITY): Payer: PPO | Admitting: Nurse Practitioner

## 2020-09-08 ENCOUNTER — Ambulatory Visit: Payer: PPO | Admitting: Physical Therapy

## 2020-09-08 ENCOUNTER — Ambulatory Visit (HOSPITAL_COMMUNITY): Payer: PPO | Admitting: Oncology

## 2020-09-08 DIAGNOSIS — M5416 Radiculopathy, lumbar region: Secondary | ICD-10-CM | POA: Diagnosis not present

## 2020-09-09 ENCOUNTER — Ambulatory Visit: Payer: PPO | Admitting: Sports Medicine

## 2020-09-10 ENCOUNTER — Other Ambulatory Visit: Payer: Self-pay

## 2020-09-10 ENCOUNTER — Inpatient Hospital Stay (HOSPITAL_BASED_OUTPATIENT_CLINIC_OR_DEPARTMENT_OTHER): Payer: PPO | Admitting: Oncology

## 2020-09-10 DIAGNOSIS — D696 Thrombocytopenia, unspecified: Secondary | ICD-10-CM | POA: Diagnosis not present

## 2020-09-10 NOTE — Progress Notes (Signed)
[place], [place] [address]   CLINIC:  Medical Oncology/Hematology  PCP:  Stacey Norlander, DO [address] [place] [address]  [phone]  REASON FOR VISIT:  Follow-up for thrombocytopenia  PRIOR THERAPY: None  CURRENT THERAPY: Observation  I connected with Stacey Summers on 09/10/20 at 11:30 AM EST by telephone visit and verified that I am speaking with the correct person using two identifiers.   I discussed the limitations, risks, security and privacy concerns of performing an evaluation and management service by telemedicine and the availability of in-person appointments. I also discussed with the patient that there may be a patient responsible charge related to this service. The patient expressed understanding and agreed to proceed.   Other persons participating in the visit and their role in the encounter: daughter  Patient's location: Home Provider's location: Clinic   INTERVAL HISTORY:  Stacey Summers, a 75 y.o. female, returns for routine follow-up for her thrombocytopenia. Stacey Summers was last seen on 05/30/2020.  In the interim, she has done well.  She is followed closely by her PCP for all of her comorbidities.  She continues to have chronic right-sided low back pain status post injection.  PCP has arranged for electric scooter to help with ambulation concerns.  She did suffer a fall in December 2021 and injured left shoulder.  She met with orthopedic MD regarding left shoulder and left knee pain pain.  After imaging and assessment diagnosis was subluxed left shoulder without dislocation or injury to rotator cuff.  She was placed on pain medicine and referred to physical therapy.  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  She is on Xifaxan and lactulose as needed.   Today, she is doing fair.  Her son suddenly passed away last week and today is his viewing.   She is with her whole family and they are having a rough week.  Overall, she feels stable.  She denies any bleeding, nosebleeds, hematochezia or hematuria.  She has recovered from her fall and pain is much more manageable.  She has not had follow-up with them since initial evaluation.   REVIEW OF SYSTEMS:  Review of Systems  Constitutional: Positive for fatigue.  Musculoskeletal: Positive for gait problem.       Shoulder pain  Neurological: Positive for dizziness and gait problem.  Psychiatric/Behavioral: Positive for depression. The patient is nervous/anxious.     PAST MEDICAL/SURGICAL HISTORY:  Past Medical History:  Diagnosis Date  . Allergy    seasonal  . Anxiety   . Arthritis    hands, back   . Cataract    surgery to remove  . Chronic back pain    lower back   . Cirrhosis of liver without ascites ([facility])    recent dx 07/11/18  . COPD (chronic obstructive pulmonary disease) (HCC)    no inhaler  . Diabetes mellitus without complication ([facility])   . DJD (degenerative joint disease)   . Fibromyalgia   . Genetic testing 03/08/2017   Stacey Summers underwent genetic counseling and testing for hereditary cancer syndromes on 02/21/2017. Her results were negative for mutations in all 83 genes analyzed by [facility]'s 83-gene Multi-Cancers Panel. Genes analyzed include: ALK, APC, ATM, AXIN2, BAP1, BARD1, BLM, BMPR1A, BRCA1, BRCA2, BRIP1, CASR, CDC73, CDH1, CDK4, CDKN1B, CDKN1C, CDKN2A, CEBPA, CHEK2, CTNNA1, DICER1, DIS3L2, EGFR, EPCAM  . GERD (gastroesophageal reflux disease)   . Hearing loss  bilateral - no hearing aids  . History of abnormal mammogram   . Hyperlipidemia   . Hypertension   . Irregular heart beat   . Neuromuscular disorder (HCC)    fibromyalgia  . Sleep apnea    cpap some  . SVD (spontaneous vaginal delivery)    x 2  . Tubular adenoma of colon 05/09/12   "innumerable number" of flat polyps   Past Surgical History:  Procedure Laterality Date  . CARPAL TUNNEL  RELEASE Left 10/20/14   [facility]  . CATARACT EXTRACTION, BILATERAL    . [facility]  . CHOLECYSTECTOMY  1990  . COLONOSCOPY    . IR RADIOLOGIST EVAL & MGMT  07/16/2019  . KNEE ARTHROSCOPY WITH SUBCHONDROPLASTY Right 03/27/2018   Procedure: Right knee arthroscopic partial medial meniscus repair versus menisectomy with medial tibia subchondroplasty;  Surgeon: Nicholes Stairs, MD;  Location: [facility];  Service: Orthopedics;  Laterality: Right;  120 mins  . LARYNX SURGERY     POLYP REMOVAL  . MULTIPLE TOOTH EXTRACTIONS    . POLYPECTOMY     vocal cord  . TONSILLECTOMY  1965  . TUBAL LIGATION    . TUMOR REMOVAL     benign fatty tumors on abdomen  . UPPER GASTROINTESTINAL ENDOSCOPY      SOCIAL HISTORY:  Social History   Socioeconomic History  . Marital status: Married    Spouse name: Stacey Summers  . Number of children: 3  . Years of education: 2  . Highest education level: GED or equivalent  Occupational History  . Occupation: retired  Tobacco Use  . Smoking status: Former Smoker    Packs/day: 1.00    Years: 3.00    Pack years: 3.00    Types: Cigarettes    Quit date: 08/15/1969    Years since quitting: 51.1  . Smokeless tobacco: Never Used  Vaping Use  . Vaping Use: Never used  Substance and Sexual Activity  . Alcohol use: No    Alcohol/week: 0.0 standard drinks  . Drug use: No  . Sexual activity: Not Currently  Other Topics Concern  . Not on file  Social History Narrative  . Not on file   Social Determinants of Health   Financial Resource Strain: Low Risk   . Difficulty of Paying Living Expenses: Not hard at all  Food Insecurity: No Food Insecurity  . Worried About Charity fundraiser in the Last Year: Never true  . Ran Out of Food in the Last Year: Never true  Transportation Needs: No Transportation Needs  . Lack of Transportation (Medical): No  . Lack of Transportation (Non-Medical): No  Physical Activity: Inactive  . Days of Exercise per Week: 0 days   . Minutes of Exercise per Session: 0 min  Stress: No Stress Concern Present  . Feeling of Stress : Only a little  Social Connections: Socially Integrated  . Frequency of Communication with Friends and Family: More than three times a week  . Frequency of Social Gatherings with Friends and Family: More than three times a week  . Attends Religious Services: More than 4 times per year  . Active Member of Clubs or Organizations: Yes  . Attends Archivist Meetings: More than 4 times per year  . Marital Status: Married  Human resources officer Violence: Not At Risk  . Fear of Current or Ex-Partner: No  . Emotionally Abused: No  . Physically Abused: No  . Sexually Abused: No    FAMILY HISTORY:  Family History  Problem Relation Age of Onset  . Prostate cancer Father        d.89 diagnosed in late-60s  . Colon cancer Father        diagnosed at 2  . Heart failure Mother        d.37  . Heart disease Mother   . Colon polyps Brother        polyposis. Underwent partial colectomy at age 19.  Marland Kitchen Spina bifida Brother        died at birth  . Stomach cancer Paternal Aunt        questionable  . Kidney cancer Cousin        d.78s maternal first-cousin  . Other Other 16       multiple lipomas  . Colon cancer Paternal Uncle        in his 13s  . Heart failure Maternal Grandmother        d.45  . Heart failure Maternal Uncle        d.50  . Colon polyps Son 56  . Heart attack Son 13  . Leukemia Maternal Aunt   . Esophageal cancer Neg Hx   . Rectal cancer Neg Hx     CURRENT MEDICATIONS:  Current Outpatient Medications  Medication Sig Dispense Refill  . atorvastatin (LIPITOR) 40 MG tablet Take 1 tablet (40 mg total) by mouth daily. 90 tablet 3  . benazepril (LOTENSIN) 40 MG tablet Take 1 tablet by mouth once daily 90 tablet 0  . blood glucose meter kit and supplies KIT Dispense based on patient and insurance preference. Use up to four times daily as directed. (FOR ICD-9 250.00, 250.01). 1  each 10  . Blood Glucose Monitoring Suppl (LIBERTY BLOOD GLUCOSE MONITOR) DEVI 1 each by Does not apply route daily. Test 1x per day and prn-dx250.02 1 each 0  . Cholecalciferol (VITAMIN D3) 5000 units CAPS Take 5,000 Units by mouth daily.    . cyclobenzaprine (FLEXERIL) 5 MG tablet TAKE 1 TABLET BY MOUTH THREE TIMES DAILY AS NEEDED FOR MUSCLE SPASMS (NEEDS APPOINTMENT) 30 tablet 0  . diltiazem (CARDIZEM CD) 120 MG 24 hr capsule Take 1 capsule by mouth once daily 90 capsule 2  . fluticasone (FLONASE) 50 MCG/ACT nasal spray Place 1 spray into both nostrils in the morning and at bedtime. 16 g 6  . furosemide (LASIX) 40 MG tablet TAKE 1 TABLET BY MOUTH IN THE MORNING AND 1/2 (ONE-HALF)  TABLET AT  1  PM 135 tablet 0  . glucose blood (ONETOUCH VERIO) test strip Check Bs up to 4 times daily Dx E11.9 400 each 3  . glucose blood test strip Test 1x per day and prn- dx 250.02 100 each 2  . lactulose (CHRONULAC) 10 GM/15ML solution Take 15 ml three times a day and as needed 946 mL 11  . LORazepam (ATIVAN) 0.5 MG tablet Take 0.5-1 tablets (0.25-0.5 mg total) by mouth 2 (two) times daily as needed for anxiety. 30 tablet 1  . meclizine (ANTIVERT) 25 MG tablet Take 1 tablet (25 mg total) by mouth 3 (three) times daily as needed for dizziness. 30 tablet 0  . metFORMIN (GLUCOPHAGE) 500 MG tablet Take 1 tablet (500 mg total) by mouth 2 (two) times daily with a meal. 180 tablet 3  . ondansetron (ZOFRAN) 4 MG tablet Take 1 tablet (4 mg total) by mouth every 8 (eight) hours as needed for nausea or vomiting. 30 tablet 1  . ONETOUCH DELICA LANCETS 27M  MISC USE ONE LANCET TO CHECK GLUCOSE ONCE DAILY AND AS NEEDED 100 each 0  . Oxycodone HCl 10 MG TABS Take 1 tablet (10 mg total) by mouth 3 (three) times daily as needed. 30 tablet 0  . traMADol (ULTRAM) 50 MG tablet Take 1 tablet (50 mg total) by mouth every 12 (twelve) hours as needed for moderate pain. 30 tablet 2  . XIFAXAN 550 MG TABS tablet Take 1 tablet by mouth  twice daily 60 tablet 0   No current facility-administered medications for this visit.    ALLERGIES:  Allergies  Allergen Reactions  . Latex     "my hands feels numb"    PHYSICAL EXAM:  Performance status (ECOG): 1 - Symptomatic but completely ambulatory  There were no vitals filed for this visit. Wt Readings from Last 3 Encounters:  08/05/20 218 lb 6.4 oz (99.1 kg)  08/04/20 216 lb (98 kg)  07/20/20 211 lb 6.4 oz (95.9 kg)   -Limited secondary to virtual visit. -She appears to be in no acute distress.  She is speaking in full complete sentences.   LABORATORY DATA:  I have reviewed the labs as listed.  CBC Latest Ref Rng & Units 08/27/2020 07/20/2020 05/20/2020  WBC 4.0 - 10.5 K/uL 5.7 6.9 5.0  Hemoglobin 12.0 - 15.0 g/dL 13.1 14.7 13.5  Hematocrit 36.0 - 46.0 % 39.6 43.1 40.5  Platelets 150 - 400 K/uL 84(L) 91(LL) 67(L)   CMP Latest Ref Rng & Units 08/27/2020 07/20/2020 05/20/2020  Glucose 70 - 99 mg/dL 176(H) - 164(H)  BUN 8 - 23 mg/dL 26(H) - 22  Creatinine 0.44 - 1.00 mg/dL 0.90 - 0.78  Sodium 135 - 145 mmol/L 140 - 139  Potassium 3.5 - 5.1 mmol/L 4.2 - 3.8  Chloride 98 - 111 mmol/L 104 - 103  CO2 22 - 32 mmol/L 30 - 28  Calcium 8.9 - 10.3 mg/dL 9.0 - 9.0  Total Protein 6.5 - 8.1 g/dL 5.7(L) 5.8(L) 5.7(L)  Total Bilirubin 0.3 - 1.2 mg/dL 2.5(H) 2.5(H) 2.6(H)  Alkaline Phos 38 - 126 U/L 120 114 87  AST 15 - 41 U/L 60(H) 73(H) 46(H)  ALT 0 - 44 U/L 34 45(H) 32      Component Value Date/Time   RBC 3.90 08/27/2020 1323   MCV 101.5 (H) 08/27/2020 1323   MCV 95 07/20/2020 1248   MCH 33.6 08/27/2020 1323   MCHC 33.1 08/27/2020 1323   RDW 13.8 08/27/2020 1323   RDW 13.0 07/20/2020 1248   LYMPHSABS 1.7 08/27/2020 1323   LYMPHSABS 1.9 03/18/2020 1449   MONOABS 0.5 08/27/2020 1323   EOSABS 0.1 08/27/2020 1323   EOSABS 0.2 03/18/2020 1449   BASOSABS 0.0 08/27/2020 1323   BASOSABS 0.0 03/18/2020 1449   Lab Results  Component Value Date   LDH 181 08/27/2020   LDH  165 05/20/2020   LDH 172 10/04/2019   Lab Results  Component Value Date   VD25OH 59.76 08/27/2020   VD25OH 53.78 05/20/2020   VD25OH 46.62 10/04/2019    DIAGNOSTIC IMAGING:  I have independently reviewed the scans and discussed with the patient. DG Knee 1-2 Views Right  Result Date: 08/13/2020 CLINICAL DATA:  Left knee pain. EXAM: RIGHT KNEE - 1-2 VIEW COMPARISON:  August 04, 2020. FINDINGS: No evidence of fracture or dislocation. Severe narrowing of medial joint space is noted. Soft tissues are unremarkable. IMPRESSION: Severe degenerative joint disease of medial joint space. No acute abnormality seen in the right knee. Electronically Signed  By: Marijo Conception M.D.   On: 08/13/2020 08:35   MR Shoulder Left Wo Contrast  Result Date: 08/23/2020 CLINICAL DATA:  Left shoulder pain after fall 3 weeks ago. EXAM: MRI OF THE LEFT SHOULDER WITHOUT CONTRAST TECHNIQUE: Multiplanar, multisequence MR imaging of the shoulder was performed. No intravenous contrast was administered. COMPARISON:  Left shoulder x-rays dated August 12, 2020. FINDINGS: Rotator cuff: Moderate supraspinatus tendinosis with full-thickness, partial width tear of the anterior distal tendon measuring 1.2 cm in AP dimension with 1.5 cm retraction. Mild distal infraspinatus tendinosis. The subscapularis and teres minor tendons are unremarkable. Muscles: No atrophy or abnormal signal of the muscles of the rotator cuff. Biceps long head: Intact and normally positioned. Moderate intra-articular tendinosis. Acromioclavicular Joint: Normal acromioclavicular joint. Type II acromion. Small amount of fluid in the subacromial/subdeltoid bursa. Glenohumeral Joint: Trace joint effusion.  No chondral defect. Labrum: Grossly intact, but evaluation is limited by lack of intraarticular fluid. Bones: Acute comminuted nondisplaced fracture of the humeral head and neck with associated marrow edema. No dislocation. No suspicious bone lesion. Other:  None. IMPRESSION: 1. Acute comminuted nondisplaced fracture of the humeral head and neck, not evident by x-ray. 2. Moderate supraspinatus tendinosis with full-thickness, partial width tear of the anterior distal tendon. 3. Moderate intra-articular biceps tendinosis. Electronically Signed   By: Titus Dubin M.D.   On: 08/23/2020 11:15   DG Shoulder Left  Result Date: 08/13/2020 CLINICAL DATA:  Fall 1 week ago.  Left shoulder pain EXAM: LEFT SHOULDER - 2+ VIEW COMPARISON:  08/04/2020 FINDINGS: Early degenerative changes in the left [facility] joint with joint space narrowing and spurring. No acute bony abnormality. Specifically, no fracture, subluxation, or dislocation. Previously seen subluxation of the humeral head no longer visualized. IMPRESSION: No acute bony abnormality. Electronically Signed   By: Rolm Baptise M.D.   On: 08/13/2020 08:32   DG Knee Complete 4 Views Left  Result Date: 08/13/2020 CLINICAL DATA:  Left knee pain after fall last week. EXAM: LEFT KNEE - COMPLETE 4+ VIEW COMPARISON:  August 04, 2020. FINDINGS: No evidence of fracture, dislocation, or joint effusion. No evidence of arthropathy or other focal bone abnormality. Soft tissues are unremarkable. IMPRESSION: Negative. Electronically Signed   By: Marijo Conception M.D.   On: 08/13/2020 08:34   US Abdomen Limited RUQ (LIVER/GB)  Result Date: 08/20/2020 CLINICAL DATA:  Follow-up cirrhosis EXAM: ULTRASOUND ABDOMEN LIMITED RIGHT UPPER QUADRANT COMPARISON:  05/23/2019 FINDINGS: Gallbladder: Status post cholecystectomy. Common bile duct: Diameter: 9 mm Liver: Heterogeneous with diffuse nodularity. No focal mass is noted. Portal vein is patent on color Doppler imaging with normal direction of blood flow towards the liver. Other: None. IMPRESSION: Cirrhosis of the liver. Status post cholecystectomy. Electronically Signed   By: Inez Catalina M.D.   On: 08/20/2020 08:14     ASSESSMENT:   1.  Thrombocytopenia: -Platelet count is ranging  anywhere between 80-130.  Following with him -CT of the AP on 06/27/2018 reviewed by me showed spleen is mildly enlarged.  She has cirrhosis of the liver. -We reviewed labs which showed platelet count 81.  B12 was normal. 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  She had many questions about cirrhosis and portal hypertension which were answered.  2.  Lung nodules: -I have independently reviewed CT of the chest from 10/04/2019 which showed resolution of recent COVID-19 pneumonia.  Bilateral pulmonary nodules, less than 4 mm in size have been stable. -She is a non-smoker and has not smoked cigarettes since  1971.  Hence I did not recommend any follow-up CT scans.    PLAN:  1.  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 -Latest CBC on 08/27/2020 show a platelet count of 84,000. -WBC and hemoglobin are normal. -She denies any easy bruising or bleeding. -If the platelet count drops below 30,000, splenic artery embolization can be considered.  Disposition: -We will see her back in 3 months for follow-up with repeat labs (CBC, CMP, LDH, Vit B12, Vit D)   Orders placed this encounter:  No orders of the defined types were placed in this encounter.  Faythe Casa, NP 09/10/2020 11:44 AM  [place] [place] [phone]

## 2020-09-14 ENCOUNTER — Ambulatory Visit: Payer: PPO | Admitting: Sports Medicine

## 2020-09-15 ENCOUNTER — Ambulatory Visit: Payer: PPO | Admitting: Physical Therapy

## 2020-09-22 ENCOUNTER — Telehealth (INDEPENDENT_AMBULATORY_CARE_PROVIDER_SITE_OTHER): Payer: PPO | Admitting: Sports Medicine

## 2020-09-22 DIAGNOSIS — M25512 Pain in left shoulder: Secondary | ICD-10-CM

## 2020-09-22 NOTE — Progress Notes (Signed)
   Virtual Visit via Telephone   I connected with  Stacey Summers  on 09/22/20 by telephone/telehealth and verified that I am speaking with the correct person using two identifiers.   I discussed the limitations, risks, security and privacy concerns of performing an evaluation and management service by telephone, including the higher likelihood of inaccurate diagnosis and treatment, and the availability of in person appointments.  We also discussed the likely need of an additional face to face encounter for complete and high quality delivery of care.  I also discussed with the patient that there may be a patient responsible charge related to this service. The patient expressed understanding and wishes to proceed.  Provider location is in medical facility. Patient location is at their home, different from provider location. People involved in care of the patient during this telehealth encounter were myself, my nurse/medical assistant, and my front office/scheduling team member.  Review of Systems: No fevers, chills, night sweats, weight loss, chest pain, or shortness of breath.   Objective Findings:    General: Speaking full sentences, no audible heavy breathing.  Sounds alert and appropriately interactive.    Independent interpretation of tests performed by another provider:   None.  Brief History, Exam, Impression, and Recommendations:    Left shoulder pain I am revisiting Stacey Summers on the phone. She had a shoulder injury, we suspected a rotator cuff tear, ultimately MRI showed nondisplaced humeral head fracture, as well as a tear of the supraspinatus with retraction. Now after approximately 6 weeks she is doing significantly better with essentially 0 pain, I really do not think she needs physical therapy, I spoke to her daughter who tells me she is able to attain full range of motion.  It does sound as though she needs me to look at her knee, so they will schedule an appointment for  this.   I discussed the above assessment and treatment plan with the patient. The patient was provided an opportunity to ask questions and all were answered. The patient agreed with the plan and demonstrated an understanding of the instructions.   The patient was advised to call back or seek an in-person evaluation if the symptoms worsen or if the condition fails to improve as anticipated.   I provided 15 minutes of face to face and non-face-to-face time during this encounter date, time was needed to gather information, review chart, records, communicate/coordinate with staff remotely, as well as complete documentation.   ___________________________________________ Gwen Her. Dianah Field, M.D., ABFM., CAQSM. Primary Care and Sports Medicine [facility]  Adjunct Professor of [employer]

## 2020-09-22 NOTE — Assessment & Plan Note (Signed)
I am revisiting Stacey Summers on the phone. She had a shoulder injury, we suspected a rotator cuff tear, ultimately MRI showed nondisplaced humeral head fracture, as well as a tear of the supraspinatus with retraction. Now after approximately 6 weeks she is doing significantly better with essentially 0 pain, I really do not think she needs physical therapy, I spoke to her daughter who tells me she is able to attain full range of motion.  It does sound as though she needs me to look at her knee, so they will schedule an appointment for this.

## 2020-09-24 ENCOUNTER — Ambulatory Visit: Payer: PPO | Admitting: Physical Therapy

## 2020-09-28 ENCOUNTER — Telehealth: Payer: Self-pay | Admitting: Gastroenterology

## 2020-09-28 NOTE — Telephone Encounter (Signed)
Patients daughter called regarding an issue with coverage for Xifaxan medication and requesting to speak with you.

## 2020-09-29 NOTE — Telephone Encounter (Signed)
[facility] declined to help her because she has drug coverage through her Part D medicare.  Explained the process of appeal to the daughter.  Shirlean Mylar is working on this. We will keep her informaed.

## 2020-09-30 DIAGNOSIS — E261 Secondary hyperaldosteronism: Secondary | ICD-10-CM | POA: Diagnosis not present

## 2020-09-30 DIAGNOSIS — K7210 Chronic hepatic failure without coma: Secondary | ICD-10-CM | POA: Diagnosis not present

## 2020-09-30 DIAGNOSIS — J449 Chronic obstructive pulmonary disease, unspecified: Secondary | ICD-10-CM | POA: Diagnosis not present

## 2020-09-30 DIAGNOSIS — R002 Palpitations: Secondary | ICD-10-CM | POA: Diagnosis not present

## 2020-09-30 DIAGNOSIS — E1151 Type 2 diabetes mellitus with diabetic peripheral angiopathy without gangrene: Secondary | ICD-10-CM | POA: Diagnosis not present

## 2020-09-30 DIAGNOSIS — Z6841 Body Mass Index (BMI) 40.0 and over, adult: Secondary | ICD-10-CM | POA: Diagnosis not present

## 2020-09-30 DIAGNOSIS — D692 Other nonthrombocytopenic purpura: Secondary | ICD-10-CM | POA: Diagnosis not present

## 2020-09-30 DIAGNOSIS — F330 Major depressive disorder, recurrent, mild: Secondary | ICD-10-CM | POA: Diagnosis not present

## 2020-09-30 DIAGNOSIS — D6959 Other secondary thrombocytopenia: Secondary | ICD-10-CM | POA: Diagnosis not present

## 2020-09-30 DIAGNOSIS — K766 Portal hypertension: Secondary | ICD-10-CM | POA: Diagnosis not present

## 2020-09-30 DIAGNOSIS — E662 Morbid (severe) obesity with alveolar hypoventilation: Secondary | ICD-10-CM | POA: Diagnosis not present

## 2020-09-30 DIAGNOSIS — K7460 Unspecified cirrhosis of liver: Secondary | ICD-10-CM | POA: Diagnosis not present

## 2020-10-01 NOTE — Telephone Encounter (Signed)
Letter typed and faxed intoday for the appeal with [facility]

## 2020-10-07 ENCOUNTER — Other Ambulatory Visit: Payer: Self-pay | Admitting: Cardiovascular Disease

## 2020-10-07 ENCOUNTER — Other Ambulatory Visit: Payer: Self-pay | Admitting: Family Medicine

## 2020-10-07 DIAGNOSIS — K7290 Hepatic failure, unspecified without coma: Secondary | ICD-10-CM | POA: Diagnosis not present

## 2020-10-07 DIAGNOSIS — R600 Localized edema: Secondary | ICD-10-CM

## 2020-10-07 DIAGNOSIS — K7460 Unspecified cirrhosis of liver: Secondary | ICD-10-CM | POA: Diagnosis not present

## 2020-10-07 DIAGNOSIS — K7581 Nonalcoholic steatohepatitis (NASH): Secondary | ICD-10-CM | POA: Diagnosis not present

## 2020-10-07 DIAGNOSIS — E1159 Type 2 diabetes mellitus with other circulatory complications: Secondary | ICD-10-CM

## 2020-10-07 DIAGNOSIS — I152 Hypertension secondary to endocrine disorders: Secondary | ICD-10-CM

## 2020-10-07 DIAGNOSIS — D696 Thrombocytopenia, unspecified: Secondary | ICD-10-CM | POA: Diagnosis not present

## 2020-10-14 ENCOUNTER — Ambulatory Visit: Payer: PPO | Admitting: Gastroenterology

## 2020-10-14 ENCOUNTER — Other Ambulatory Visit (INDEPENDENT_AMBULATORY_CARE_PROVIDER_SITE_OTHER): Payer: PPO

## 2020-10-14 ENCOUNTER — Encounter: Payer: Self-pay | Admitting: Gastroenterology

## 2020-10-14 VITALS — BP 90/60 | HR 53 | Ht 62.0 in | Wt 213.0 lb

## 2020-10-14 DIAGNOSIS — K7460 Unspecified cirrhosis of liver: Secondary | ICD-10-CM

## 2020-10-14 DIAGNOSIS — R188 Other ascites: Secondary | ICD-10-CM

## 2020-10-14 DIAGNOSIS — K7581 Nonalcoholic steatohepatitis (NASH): Secondary | ICD-10-CM

## 2020-10-14 LAB — CBC WITH DIFFERENTIAL/PLATELET
Basophils Absolute: 0 10*3/uL (ref 0.0–0.1)
Basophils Relative: 0.5 % (ref 0.0–3.0)
Eosinophils Absolute: 0.2 10*3/uL (ref 0.0–0.7)
Eosinophils Relative: 2.8 % (ref 0.0–5.0)
HCT: 40 % (ref 36.0–46.0)
Hemoglobin: 13.8 g/dL (ref 12.0–15.0)
Lymphocytes Relative: 38 % (ref 12.0–46.0)
Lymphs Abs: 2.4 10*3/uL (ref 0.7–4.0)
MCHC: 34.4 g/dL (ref 30.0–36.0)
MCV: 97.1 fl (ref 78.0–100.0)
Monocytes Absolute: 0.6 10*3/uL (ref 0.1–1.0)
Monocytes Relative: 9.9 % (ref 3.0–12.0)
Neutro Abs: 3.1 10*3/uL (ref 1.4–7.7)
Neutrophils Relative %: 48.8 % (ref 43.0–77.0)
Platelets: 95 10*3/uL — ABNORMAL LOW (ref 150.0–400.0)
RBC: 4.11 Mil/uL (ref 3.87–5.11)
RDW: 14.4 % (ref 11.5–15.5)
WBC: 6.4 10*3/uL (ref 4.0–10.5)

## 2020-10-14 LAB — COMPREHENSIVE METABOLIC PANEL
ALT: 32 U/L (ref 0–35)
AST: 53 U/L — ABNORMAL HIGH (ref 0–37)
Albumin: 3.1 g/dL — ABNORMAL LOW (ref 3.5–5.2)
Alkaline Phosphatase: 93 U/L (ref 39–117)
BUN: 25 mg/dL — ABNORMAL HIGH (ref 6–23)
CO2: 31 mEq/L (ref 19–32)
Calcium: 9.2 mg/dL (ref 8.4–10.5)
Chloride: 106 mEq/L (ref 96–112)
Creatinine, Ser: 0.96 mg/dL (ref 0.40–1.20)
GFR: 59.09 mL/min — ABNORMAL LOW (ref >60.00)
Glucose, Bld: 122 mg/dL — ABNORMAL HIGH (ref 70–99)
Potassium: 3.9 mEq/L (ref 3.5–5.1)
Sodium: 141 mEq/L (ref 135–145)
Total Bilirubin: 2.4 mg/dL — ABNORMAL HIGH (ref 0.2–1.2)
Total Protein: 6 g/dL (ref 6.0–8.3)

## 2020-10-14 LAB — PROTIME-INR
INR: 1.2 ratio — ABNORMAL HIGH (ref 0.8–1.0)
Prothrombin Time: 13.7 s — ABNORMAL HIGH (ref 9.6–13.1)

## 2020-10-14 LAB — AMMONIA: Ammonia: 41 umol/L — ABNORMAL HIGH (ref 11–35)

## 2020-10-14 NOTE — Progress Notes (Signed)
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  Her 73 year old son recently passed away unexpectedly.  She had an episode of sinusitis and fever that lasted a day around the time of his funeral.  She continues to have intermittent episodes of confusion, mood changes and changes in sleep pattern.  She tends to sleep during daytime as she has difficulty falling asleep at night.  According to patient she sleeps during the day because she is bored and there is nothing else to do . She is having decreased energy, feels more fatigued.   Denies any worsening confusion, abdominal distention, melena or rectal bleeding. No vomiting, abdominal bloating or weight gain.  EGD 08/20/19: - Grade I and small (< 5 mm) esophageal varices. - No gross lesions in esophagus. - Portal hypertensive gastropathy. - Normal examined duodenum.  EGD 08/07/2018: - The Z-line was regular and was found 36 cm from the incisors. - No gross lesions were noted in the entire esophagus. No esophageal varices. - The stomach was normal. No gastric varices. - The examined duodenum was normal.  Colonoscopy 08/07/2018 - Four 4 to 9 mm polyps in the transverse colon and in the ascending colon, removed with a cold snare. Resected and retrieved. - Diverticulosis in the sigmoid colon. - Non-bleeding internal hemorrhoids.  Relevant history: History of COVID-19 infection November 2020.Worsening mental status with confusion and somnolence post Covid.  She has prominent splenorenal shunt, was referred to IR to discuss possible embolization. But given she has significant  improvement of symptoms with Xifaxan twice daily, family and patient preferredto hold off the embolization.    Outpatient Encounter Medications as of 10/14/2020  Medication Sig  . atorvastatin (LIPITOR) 40 MG tablet Take 1 tablet (40 mg total) by mouth daily.  . benazepril (LOTENSIN) 40 MG tablet Take 1 tablet by mouth once daily  . blood glucose meter kit and supplies KIT Dispense based on patient and insurance preference. Use up to four times daily as directed. (FOR ICD-9 250.00, 250.01).  . Blood Glucose Monitoring Suppl (LIBERTY BLOOD GLUCOSE MONITOR) DEVI 1 each by Does not apply route daily. Test 1x per day and prn-dx250.02  . Cholecalciferol (VITAMIN D3) 5000 units CAPS Take 5,000 Units by mouth daily.  . cyclobenzaprine (FLEXERIL) 5 MG tablet TAKE 1 TABLET BY MOUTH THREE TIMES DAILY AS NEEDED FOR MUSCLE SPASMS (NEEDS APPOINTMENT)  . diltiazem (CARDIZEM CD) 120 MG 24 hr capsule Take 1 capsule by mouth once daily  . fluticasone (FLONASE) 50 MCG/ACT nasal spray Place 1 spray into both nostrils in the morning and at bedtime.  . furosemide (LASIX) 40 MG tablet TAKE 1 TABLET BY MOUTH IN THE MORNING AND 1/2 (ONE-HALF) TABLET AT 1 PM  . glucose blood (ONETOUCH VERIO) test strip Check Bs up to 4 times daily Dx E11.9  . glucose blood test strip Test 1x per day and prn- dx 250.02  . lactulose (CHRONULAC) 10 GM/15ML solution Take 15 ml three times a day and as needed  . LORazepam (ATIVAN) 0.5 MG tablet Take 0.5-1 tablets (0.25-0.5 mg total) by mouth 2 (two) times daily as needed for anxiety.  . meclizine (ANTIVERT)  25 MG tablet Take 1 tablet (25 mg total) by mouth 3 (three) times daily as needed for dizziness.  . metFORMIN (GLUCOPHAGE) 500 MG tablet Take 1 tablet (500 mg total) by mouth 2 (two) times daily with a meal.  . ondansetron (ZOFRAN) 4 MG tablet Take 1 tablet (4 mg total) by mouth every 8 (eight) hours as needed for nausea or vomiting.  Glory Rosebush DELICA LANCETS 22W MISC USE ONE LANCET TO  CHECK GLUCOSE ONCE DAILY AND AS NEEDED  . Oxycodone HCl 10 MG TABS Take 1 tablet (10 mg total) by mouth 3 (three) times daily as needed.  . traMADol (ULTRAM) 50 MG tablet Take 1 tablet (50 mg total) by mouth every 12 (twelve) hours as needed for moderate pain.  Marland Kitchen XIFAXAN 550 MG TABS tablet Take 1 tablet by mouth twice daily   No facility-administered encounter medications on file as of 10/14/2020.    Allergies as of 10/14/2020 - Review Complete 08/14/2020  Allergen Reaction Noted  . Latex  03/27/2018    Past Medical History:  Diagnosis Date  . Allergy    seasonal  . Anxiety   . Arthritis    hands, back   . Cataract    surgery to remove  . Chronic back pain    lower back   . Cirrhosis of liver without ascites ([facility])    recent dx 07/11/18  . COPD (chronic obstructive pulmonary disease) (HCC)    no inhaler  . Diabetes mellitus without complication ([facility])   . DJD (degenerative joint disease)   . Fibromyalgia   . Genetic testing 03/08/2017   Ms. Liverman underwent genetic counseling and testing for hereditary cancer syndromes on 02/21/2017. Her results were negative for mutations in all 83 genes analyzed by [facility]'s 83-gene Multi-Cancers Panel. Genes analyzed include: ALK, APC, ATM, AXIN2, BAP1, BARD1, BLM, BMPR1A, BRCA1, BRCA2, BRIP1, CASR, CDC73, CDH1, CDK4, CDKN1B, CDKN1C, CDKN2A, CEBPA, CHEK2, CTNNA1, DICER1, DIS3L2, EGFR, EPCAM  . GERD (gastroesophageal reflux disease)   . 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 History of abnormal mammogram   . Hyperlipidemia   . Hypertension   . Irregular heart beat   . Neuromuscular disorder (HCC)    fibromyalgia  . Sleep apnea    cpap some  . SVD (spontaneous vaginal delivery)    x 2  . Tubular adenoma of colon 05/09/12   "innumerable number" of flat polyps    Past Surgical History:  Procedure Laterality Date  . CARPAL TUNNEL RELEASE Left 10/20/14   [facility]  . CATARACT EXTRACTION, BILATERAL    . [facility]  .  CHOLECYSTECTOMY  1990  . COLONOSCOPY    . IR RADIOLOGIST EVAL & MGMT  07/16/2019  . KNEE ARTHROSCOPY WITH SUBCHONDROPLASTY Right 03/27/2018   Procedure: Right knee arthroscopic partial medial meniscus repair versus menisectomy with medial tibia subchondroplasty;  Surgeon: Nicholes Stairs, MD;  Location: [facility];  Service: Orthopedics;  Laterality: Right;  120 mins  . LARYNX SURGERY     POLYP REMOVAL  . MULTIPLE TOOTH EXTRACTIONS    . POLYPECTOMY     vocal cord  . TONSILLECTOMY  1965  . TUBAL LIGATION    . TUMOR REMOVAL     benign fatty tumors on abdomen  . UPPER GASTROINTESTINAL ENDOSCOPY      Family History  Problem Relation Age of Onset  . Prostate cancer Father        d.89 diagnosed in late-60s  .  Colon cancer Father        diagnosed at 62  . Heart failure Mother        d.37  . Heart disease Mother   . Colon polyps Brother        polyposis. Underwent partial colectomy at age 51.  Marland Kitchen Spina bifida Brother        died at birth  . Stomach cancer Paternal Aunt        questionable  . Kidney cancer Cousin        d.78s maternal first-cousin  . Other Other 16       multiple lipomas  . Colon cancer Paternal Uncle        in his 40s  . Heart failure Maternal Grandmother        d.45  . Heart failure Maternal Uncle        d.50  . Colon polyps Son 23  . Heart attack Son 54  . Leukemia Maternal Aunt   . Esophageal cancer Neg Hx   . Rectal cancer Neg Hx     Social History   Socioeconomic History  . Marital status: Married    Spouse name: [name]  . Number of children: 3  . Years of education: 43  . Highest education level: GED or equivalent  Occupational History  . Occupation: retired  Tobacco Use  . Smoking status: Former Smoker    Packs/day: 1.00    Years: 3.00    Pack years: 3.00    Types: Cigarettes    Quit date: 08/15/1969    Years since quitting: 51.2  . Smokeless tobacco: Never Used  Vaping Use  . Vaping Use: Never used  Substance and Sexual Activity  .  Alcohol use: No    Alcohol/week: 0.0 standard drinks  . Drug use: No  . Sexual activity: Not Currently  Other Topics Concern  . Not on file  Social History Narrative  . Not on file   Social Determinants of Health   Financial Resource Strain: Low Risk   . Difficulty of Paying Living Expenses: Not hard at all  Food Insecurity: No Food Insecurity  . Worried About Charity fundraiser in the Last Year: Never true  . Ran Out of Food in the Last Year: Never true  Transportation Needs: No Transportation Needs  . Lack of Transportation (Medical): No  . Lack of Transportation (Non-Medical): No  Physical Activity: Inactive  . Days of Exercise per Week: 0 days  . Minutes of Exercise per Session: 0 min  Stress: No Stress Concern Present  . Feeling of Stress : Only a little  Social Connections: Socially Integrated  . Frequency of Communication with Friends and Family: More than three times a week  . Frequency of Social Gatherings with Friends and Family: More than three times a week  . Attends Religious Services: More than 4 times per year  . Active Member of Clubs or Organizations: Yes  . Attends Archivist Meetings: More than 4 times per year  . Marital Status: Married  Human resources officer Violence: Not At Risk  . Fear of Current or Ex-Partner: No  . Emotionally Abused: No  . Physically Abused: No  . Sexually Abused: No      Review of systems: All other review of systems negative except as mentioned in the HPI.   Physical Exam: Vitals:   10/14/20 1329  BP: 90/60  Pulse: (!) 53   Body mass index is 38.96 kg/m. Gen:  No acute distress HEENT:  sclera anicteric Abd:      soft, non-tender; no palpable masses, no distension Ext:    No edema Neuro: alert and oriented x 3 Psych: normal mood and affect  Data Reviewed:  Reviewed labs, radiology imaging, old records and pertinent past GI work up   Assessment and Plan/Recommendations:  75 year old very pleasant  with history of Stacey Summers cirrhosis decompensated with hepatic encephalopathy  Overall clinical status is stable Meld score remains low, 12  Follow-up CBC, CMP & PT/ INR   Hepatocellular screening: No hepatic lesions on ultrasound, AFP within normal range  Chronic thrombocytopenia: Secondary to portal hypertension in the setting of [place] cirrhosis  Volume status: Stable  Hepatic encephalopathy: Continue Xifaxan twice daily and lactulose as needed with goal 3 soft bowel movements daily  Small esophageal varices on EGD in January 2021  Continue with low-carb and low-fat diet, avoid artificial sweeteners, high fructose corn syrup. Daily exercise as tolerated  History of colon polyps, due for surveillance colonoscopy in December 2022  Return in 3 to 4 months or sooner if needed  This visit required >40 minutes of patient care (this includes precharting, chart review, review of results, face-to-face time used for counseling as well as treatment plan and follow-up. The patient was provided an opportunity to ask questions and all were answered. The patient agreed with the plan and demonstrated an understanding of the instructions.  Damaris Hippo , MD    CC: Janora Norlander, DO

## 2020-10-14 NOTE — Patient Instructions (Signed)
Continue Lactulose  Your provider has requested that you go to the basement level for lab work before leaving today. Press "B" on the elevator. The lab is located at the first door on the left as you exit the elevator.  Follow up in 4 months  Due to recent changes in healthcare laws, you may see the results of your imaging and laboratory studies on MyChart before your provider has had a chance to review them.  We understand that in some cases there may be results that are confusing or concerning to you. Not all laboratory results come back in the same time frame and the provider may be waiting for multiple results in order to interpret others.  Please give [place] 48 hours in order for your provider to thoroughly review all the results before contacting the office for clarification of your results.   I appreciate the  opportunity to care for you  Thank You   Harl Bowie , MD

## 2020-10-19 ENCOUNTER — Ambulatory Visit: Payer: PPO | Admitting: Family Medicine

## 2020-10-19 NOTE — Progress Notes (Deleted)
Subjective: CC: DM, chronic pain PCP: Stacey Summers, Stacey Summers Stacey Summers is a 75 y.o. female presenting to clinic today for:  1. Type 2 Diabetes with hypertension, hyperlipidemia:  Glucometer:***.   High at home: ***; Low at home: ***, Taking medication(s): ***,.  Last eye exam: *** Last foot exam: *** Last A1c:  Lab Results  Component Value Date   HGBA1C 6.1 07/20/2020   Nephropathy screen indicated?: *** Last flu, zoster and/or pneumovax:  Immunization History  Administered Date(s) Administered  . Hepb-cpg 07/19/2018, 08/20/2018  . Influenza, High Dose Seasonal PF 08/03/2018  . Influenza,inj,Quad PF,6+ Mos 05/28/2014, 06/16/2015, 06/10/2016, 08/05/2020  . Influenza,inj,quad, With Preservative 05/15/2017, 08/03/2018  . Influenza-Unspecified 04/24/2017  . Pneumococcal Conjugate-13 07/21/2014  . Pneumococcal Polysaccharide-23 09/21/2015  . Tdap 07/07/2009  . Zoster Recombinat (Shingrix) 04/24/2017, 07/11/2017    ROS: ***dizziness, LOC, polyuria, polydipsia, unintended weight loss/gain, foot ulcerations, numbness or tingling in extremities, shortness of breath or chest pain.  2. Chronic pain ***  ROS: Per HPI  Allergies  Allergen Reactions  . Latex     "my hands feels numb"   Past Medical History:  Diagnosis Date  . Allergy    seasonal  . Anxiety   . Arthritis    hands, back   . Cataract    surgery to remove  . Chronic back pain    lower back   . Cirrhosis of liver without ascites ([facility])    recent dx 07/11/18  . COPD (chronic obstructive pulmonary disease) (HCC)    no inhaler  . Diabetes mellitus without complication ([facility])   . DJD (degenerative joint disease)   . Fibromyalgia   . Genetic testing 03/08/2017   Stacey Summers underwent genetic counseling and testing for hereditary cancer syndromes on 02/21/2017. Her results were negative for mutations in all 83 genes analyzed by [facility]'s 83-gene Multi-Cancers Panel. Genes analyzed include: ALK,  APC, ATM, AXIN2, BAP1, BARD1, BLM, BMPR1A, BRCA1, BRCA2, BRIP1, CASR, CDC73, CDH1, CDK4, CDKN1B, CDKN1C, CDKN2A, CEBPA, CHEK2, CTNNA1, DICER1, DIS3L2, EGFR, EPCAM  . GERD (gastroesophageal reflux disease)   . 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 History of abnormal mammogram   . Hyperlipidemia   . Hypertension   . Irregular heart beat   . Neuromuscular disorder (HCC)    fibromyalgia  . Sleep apnea    cpap some  . SVD (spontaneous vaginal delivery)    x 2  . Tubular adenoma of colon 05/09/12   "innumerable number" of flat polyps    Current Outpatient Medications:  .  atorvastatin (LIPITOR) 40 MG tablet, Take 1 tablet (40 mg total) by mouth daily., Disp: 90 tablet, Rfl: 3 .  benazepril (LOTENSIN) 40 MG tablet, Take 1 tablet by mouth once daily, Disp: 90 tablet, Rfl: 0 .  blood glucose meter kit and supplies KIT, Dispense based on patient and insurance preference. Use up to four times daily as directed. (FOR ICD-9 250.00, 250.01)., Disp: 1 each, Rfl: 10 .  Blood Glucose Monitoring Suppl (LIBERTY BLOOD GLUCOSE MONITOR) DEVI, 1 each by Does not apply route daily. Test 1x per day and prn-dx250.02, Disp: 1 each, Rfl: 0 .  Cholecalciferol (VITAMIN D3) 5000 units CAPS, Take 5,000 Units by mouth daily., Disp: , Rfl:  .  cyclobenzaprine (FLEXERIL) 5 MG tablet, TAKE 1 TABLET BY MOUTH THREE TIMES DAILY AS NEEDED FOR MUSCLE SPASMS (NEEDS APPOINTMENT), Disp: 30 tablet, Rfl: 0 .  diltiazem (CARDIZEM CD) 120 MG 24 hr capsule, Take 1  capsule by mouth once daily, Disp: 90 capsule, Rfl: 1 .  fluticasone (FLONASE) 50 MCG/ACT nasal spray, Place 1 spray into both nostrils in the morning and at bedtime., Disp: 16 g, Rfl: 6 .  furosemide (LASIX) 40 MG tablet, TAKE 1 TABLET BY MOUTH IN THE MORNING AND 1/2 (ONE-HALF) TABLET AT 1 PM, Disp: 135 tablet, Rfl: 0 .  glucose blood (ONETOUCH VERIO) test strip, Check Bs up to 4 times daily Dx E11.9, Disp: 400 each, Rfl: 3 .  glucose blood test strip, Test 1x per  day and prn- dx 250.02, Disp: 100 each, Rfl: 2 .  lactulose (CHRONULAC) 10 GM/15ML solution, Take 15 ml three times a day and as needed, Disp: 946 mL, Rfl: 11 .  LORazepam (ATIVAN) 0.5 MG tablet, Take 0.5-1 tablets (0.25-0.5 mg total) by mouth 2 (two) times daily as needed for anxiety., Disp: 30 tablet, Rfl: 1 .  meclizine (ANTIVERT) 25 MG tablet, Take 1 tablet (25 mg total) by mouth 3 (three) times daily as needed for dizziness., Disp: 30 tablet, Rfl: 0 .  metFORMIN (GLUCOPHAGE) 500 MG tablet, Take 1 tablet (500 mg total) by mouth 2 (two) times daily with a meal., Disp: 180 tablet, Rfl: 3 .  ondansetron (ZOFRAN) 4 MG tablet, Take 1 tablet (4 mg total) by mouth every 8 (eight) hours as needed for nausea or vomiting., Disp: 30 tablet, Rfl: 1 .  ONETOUCH DELICA LANCETS 70Y MISC, USE ONE LANCET TO CHECK GLUCOSE ONCE DAILY AND AS NEEDED, Disp: 100 each, Rfl: 0 .  traMADol (ULTRAM) 50 MG tablet, Take 1 tablet (50 mg total) by mouth every 12 (twelve) hours as needed for moderate pain., Disp: 30 tablet, Rfl: 2 .  XIFAXAN 550 MG TABS tablet, Take 1 tablet by mouth twice daily, Disp: 60 tablet, Rfl: 0 Social History   Socioeconomic History  . Marital status: Married    Spouse name: Stacey Summers  . Number of children: 3  . Years of education: 67  . Highest education level: GED or equivalent  Occupational History  . Occupation: retired  Tobacco Use  . Smoking status: Former Smoker    Packs/day: 1.00    Years: 3.00    Pack years: 3.00    Types: Cigarettes    Quit date: 08/15/1969    Years since quitting: 51.2  . Smokeless tobacco: Never Used  Vaping Use  . Vaping Use: Never used  Substance and Sexual Activity  . Alcohol use: No    Alcohol/week: 0.0 standard drinks  . Drug use: No  . Sexual activity: Not Currently  Other Topics Concern  . Not on file  Social History Narrative  . Not on file   Social Determinants of Health   Financial Resource Strain: Low Risk   . Difficulty of Paying Living  Expenses: Not hard at all  Food Insecurity: No Food Insecurity  . Worried About Charity fundraiser in the Last Year: Never true  . Ran Out of Food in the Last Year: Never true  Transportation Needs: No Transportation Needs  . Lack of Transportation (Medical): No  . Lack of Transportation (Non-Medical): No  Physical Activity: Inactive  . Days of Exercise per Week: 0 days  . Minutes of Exercise per Session: 0 min  Stress: No Stress Concern Present  . Feeling of Stress : Only a little  Social Connections: Socially Integrated  . Frequency of Communication with Friends and Family: More than three times a week  . Frequency of Social Gatherings with  Friends and Family: More than three times a week  . Attends Religious Services: More than 4 times per year  . Active Member of Clubs or Organizations: Yes  . Attends Archivist Meetings: More than 4 times per year  . Marital Status: Married  Human resources officer Violence: Not At Risk  . Fear of Current or Ex-Partner: No  . Emotionally Abused: No  . Physically Abused: No  . Sexually Abused: No   Family History  Problem Relation Age of Onset  . Prostate cancer Father        d.89 diagnosed in late-60s  . Colon cancer Father        diagnosed at 74  . Heart failure Mother        d.37  . Heart disease Mother   . Colon polyps Brother        polyposis. Underwent partial colectomy at age 26.  Marland Kitchen Spina bifida Brother        died at birth  . Stomach cancer Paternal Aunt        questionable  . Kidney cancer Cousin        d.78s maternal first-cousin  . Other Other 16       multiple lipomas  . Colon cancer Paternal Uncle        in his 45s  . Heart failure Maternal Grandmother        d.45  . Heart failure Maternal Uncle        d.50  . Colon polyps Son 19  . Heart attack Son 41  . Leukemia Maternal Aunt   . Esophageal cancer Neg Hx   . Rectal cancer Neg Hx     Objective: Office vital signs reviewed. There were no vitals taken for  this visit.  Physical Examination:  General: Awake, alert, *** nourished, No acute distress HEENT: Normal    Neck: No masses palpated. No lymphadenopathy    Ears: Tympanic membranes intact, normal light reflex, no erythema, no bulging    Eyes: PERRLA, extraocular membranes intact, sclera ***    Nose: nasal turbinates moist, *** nasal discharge    Throat: moist mucus membranes, no erythema, *** tonsillar exudate.  Airway is patent Cardio: regular rate and rhythm, S1S2 heard, no murmurs appreciated Pulm: clear to auscultation bilaterally, no wheezes, rhonchi or rales; normal work of breathing on room air GI: soft, non-tender, non-distended, bowel sounds present x4, no hepatomegaly, no splenomegaly, no masses GU: external vaginal tissue ***, cervix ***, *** punctate lesions on cervix appreciated, *** discharge from cervical os, *** bleeding, *** cervical motion tenderness, *** abdominal/ adnexal masses Extremities: warm, well perfused, No edema, cyanosis or clubbing; +*** pulses bilaterally MSK: *** gait and *** station Skin: dry; intact; no rashes or lesions Neuro: *** Strength and light touch sensation grossly intact, *** DTRs ***/4  Assessment/ Plan: 75 y.o. female   ***  No orders of the defined types were placed in this encounter.  No orders of the defined types were placed in this encounter.    Stacey Summers, Stacey Summers [place] [phone]

## 2020-10-21 ENCOUNTER — Encounter: Payer: Self-pay | Admitting: Family Medicine

## 2020-10-22 NOTE — Telephone Encounter (Signed)
Called [employer] today to find out about the appeal I submitted. They are telling me that it is still in process.  Should be about 2 more days for processing we should receive a fax whether accepted or not

## 2020-11-06 ENCOUNTER — Encounter: Payer: Self-pay | Admitting: Gastroenterology

## 2020-11-12 NOTE — Telephone Encounter (Signed)
Spoke with [employer] about patients Stacey Summers today, They said they never received her cost sheet of how much the medication would cost her a month. I faxed over cost sheet from [facility] for $3,260.33. Pts cost a month. Faxed to emergency number [phone] aas directed to do by the representative

## 2020-11-16 NOTE — Telephone Encounter (Signed)
Patient finally approved for the PAP with [employer]  Appeal approved until 08/14/2021

## 2020-11-18 ENCOUNTER — Encounter: Payer: Self-pay | Admitting: Family Medicine

## 2020-11-18 ENCOUNTER — Ambulatory Visit (INDEPENDENT_AMBULATORY_CARE_PROVIDER_SITE_OTHER): Payer: PPO | Admitting: Family Medicine

## 2020-11-18 VITALS — BP 144/65

## 2020-11-18 DIAGNOSIS — J441 Chronic obstructive pulmonary disease with (acute) exacerbation: Secondary | ICD-10-CM

## 2020-11-18 MED ORDER — ALBUTEROL SULFATE HFA 108 (90 BASE) MCG/ACT IN AERS: 2.0000 | INHALATION_SPRAY | Freq: Four times a day (QID) | RESPIRATORY_TRACT | 0 refills | Status: DC | PRN

## 2020-11-18 MED ORDER — METHYLPREDNISOLONE 4 MG PO TBPK: ORAL_TABLET | ORAL | 0 refills | Status: DC

## 2020-11-18 MED ORDER — AMOXICILLIN-POT CLAVULANATE 875-125 MG PO TABS: 1.0000 | ORAL_TABLET | Freq: Two times a day (BID) | ORAL | 0 refills | Status: AC

## 2020-11-18 NOTE — Progress Notes (Signed)
Virtual Visit via Telephone Note  I connected with Stacey Summers on 11/18/20 at 11:06 AM by telephone and verified that I am speaking with the correct person using two identifiers. Stacey Summers is currently located at home and her daughter and son are currently with her during this visit. The provider, Loman Brooklyn, FNP is located in their home at time of visit.  I discussed the limitations, risks, security and privacy concerns of performing an evaluation and management service by telephone and the availability of in person appointments. I also discussed with the patient that there may be a patient responsible charge related to this service. The patient expressed understanding and agreed to proceed.  Subjective: PCP: Stacey Norlander, DO  Chief Complaint  Patient presents with  . URI   Patient complains of productive cough, head/chest congestion, runny nose, sneezing, sore throat, fever, wheezing and fatigued. Max temp was 100.2. Onset of symptoms was 2 days ago, gradually worsening since that time. She is drinking moderate amounts of fluids. Evaluation to date: none. Treatment to date: cough suppressants and Ibuprofen (temps >100). She has a history of COPD. She does not smoke.    ROS: Per HPI  Current Outpatient Medications:  .  atorvastatin (LIPITOR) 40 MG tablet, Take 1 tablet (40 mg total) by mouth daily., Disp: 90 tablet, Rfl: 3 .  benazepril (LOTENSIN) 40 MG tablet, Take 1 tablet by mouth once daily, Disp: 90 tablet, Rfl: 0 .  blood glucose meter kit and supplies KIT, Dispense based on patient and insurance preference. Use up to four times daily as directed. (FOR ICD-9 250.00, 250.01)., Disp: 1 each, Rfl: 10 .  Blood Glucose Monitoring Suppl (LIBERTY BLOOD GLUCOSE MONITOR) DEVI, 1 each by Does not apply route daily. Test 1x per day and prn-dx250.02, Disp: 1 each, Rfl: 0 .  Cholecalciferol (VITAMIN D3) 5000 units CAPS, Take 5,000 Units by mouth daily., Disp: , Rfl:  .   cyclobenzaprine (FLEXERIL) 5 MG tablet, TAKE 1 TABLET BY MOUTH THREE TIMES DAILY AS NEEDED FOR MUSCLE SPASMS (NEEDS APPOINTMENT), Disp: 30 tablet, Rfl: 0 .  diltiazem (CARDIZEM CD) 120 MG 24 hr capsule, Take 1 capsule by mouth once daily, Disp: 90 capsule, Rfl: 1 .  fluticasone (FLONASE) 50 MCG/ACT nasal spray, Place 1 spray into both nostrils in the morning and at bedtime., Disp: 16 g, Rfl: 6 .  furosemide (LASIX) 40 MG tablet, TAKE 1 TABLET BY MOUTH IN THE MORNING AND 1/2 (ONE-HALF) TABLET AT 1 PM, Disp: 135 tablet, Rfl: 0 .  glucose blood (ONETOUCH VERIO) test strip, Check Bs up to 4 times daily Dx E11.9, Disp: 400 each, Rfl: 3 .  glucose blood test strip, Test 1x per day and prn- dx 250.02, Disp: 100 each, Rfl: 2 .  lactulose (CHRONULAC) 10 GM/15ML solution, Take 15 ml three times a day and as needed, Disp: 946 mL, Rfl: 11 .  LORazepam (ATIVAN) 0.5 MG tablet, Take 0.5-1 tablets (0.25-0.5 mg total) by mouth 2 (two) times daily as needed for anxiety., Disp: 30 tablet, Rfl: 1 .  meclizine (ANTIVERT) 25 MG tablet, Take 1 tablet (25 mg total) by mouth 3 (three) times daily as needed for dizziness., Disp: 30 tablet, Rfl: 0 .  metFORMIN (GLUCOPHAGE) 500 MG tablet, Take 1 tablet (500 mg total) by mouth 2 (two) times daily with a meal., Disp: 180 tablet, Rfl: 3 .  ondansetron (ZOFRAN) 4 MG tablet, Take 1 tablet (4 mg total) by mouth every 8 (eight) hours  as needed for nausea or vomiting., Disp: 30 tablet, Rfl: 1 .  ONETOUCH DELICA LANCETS 44I MISC, USE ONE LANCET TO CHECK GLUCOSE ONCE DAILY AND AS NEEDED, Disp: 100 each, Rfl: 0 .  traMADol (ULTRAM) 50 MG tablet, Take 1 tablet (50 mg total) by mouth every 12 (twelve) hours as needed for moderate pain., Disp: 30 tablet, Rfl: 2 .  XIFAXAN 550 MG TABS tablet, Take 1 tablet by mouth twice daily, Disp: 60 tablet, Rfl: 0  Allergies  Allergen Reactions  . Latex     "my hands feels numb"   Past Medical History:  Diagnosis Date  . Allergy    seasonal  .  Anxiety   . Arthritis    hands, back   . Cataract    surgery to remove  . Chronic back pain    lower back   . Cirrhosis of liver without ascites ([place])    recent dx 07/11/18  . COPD (chronic obstructive pulmonary disease) (HCC)    no inhaler  . Diabetes mellitus without complication ([place])   . DJD (degenerative joint disease)   . Fibromyalgia   . Genetic testing 03/08/2017   Ms. Aufiero underwent genetic counseling and testing for hereditary cancer syndromes on 02/21/2017. Her results were negative for mutations in all 83 genes analyzed by [facility]'s 83-gene Multi-Cancers Panel. Genes analyzed include: ALK, APC, ATM, AXIN2, BAP1, BARD1, BLM, BMPR1A, BRCA1, BRCA2, BRIP1, CASR, CDC73, CDH1, CDK4, CDKN1B, CDKN1C, CDKN2A, CEBPA, CHEK2, CTNNA1, DICER1, DIS3L2, EGFR, EPCAM  . GERD (gastroesophageal reflux disease)   . 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 History of abnormal mammogram   . Hyperlipidemia   . Hypertension   . Irregular heart beat   . Neuromuscular disorder (HCC)    fibromyalgia  . Sleep apnea    cpap some  . SVD (spontaneous vaginal delivery)    x 2  . Tubular adenoma of colon 05/09/12   "innumerable number" of flat polyps    Observations/Objective: A&O  No respiratory distress or wheezing audible over the phone Mood, judgement, and thought processes all WNL  Assessment and Plan: 1. COPD exacerbation (HCC) - albuterol (VENTOLIN HFA) 108 (90 Base) MCG/ACT inhaler; Inhale 2 puffs into the lungs every 6 (six) hours as needed for wheezing or shortness of breath.  Dispense: 18 g; Refill: 0 - methylPREDNISolone (MEDROL DOSEPAK) 4 MG TBPK tablet; Use as directed.  Dispense: 21 each; Refill: 0 - amoxicillin-clavulanate (AUGMENTIN) 875-125 MG tablet; Take 1 tablet by mouth 2 (two) times daily for 7 days.  Dispense: 14 tablet; Refill: 0    Follow Up Instructions:  I discussed the assessment and treatment plan with the patient. The patient was provided an  opportunity to ask questions and all were answered. The patient agreed with the plan and demonstrated an understanding of the instructions.   The patient was advised to call back or seek an in-person evaluation if the symptoms worsen or if the condition fails to improve as anticipated.  The above assessment and management plan was discussed with the patient. The patient verbalized understanding of and has agreed to the management plan. Patient is aware to call the clinic if symptoms persist or worsen. Patient is aware when to return to the clinic for a follow-up visit. Patient educated on when it is appropriate to go to the emergency department.   Time call ended: 11:23 AM  I provided 17 minutes of non-face-to-face time during this encounter.  Hendricks Limes, MSN, APRN,  FNP-C [facility] 11/18/20

## 2020-11-24 ENCOUNTER — Other Ambulatory Visit: Payer: Self-pay

## 2020-11-24 ENCOUNTER — Encounter (HOSPITAL_COMMUNITY): Payer: Self-pay

## 2020-11-24 ENCOUNTER — Ambulatory Visit (HOSPITAL_COMMUNITY): Payer: PPO | Attending: Family Medicine

## 2020-11-24 DIAGNOSIS — R29898 Other symptoms and signs involving the musculoskeletal system: Secondary | ICD-10-CM | POA: Diagnosis not present

## 2020-11-24 NOTE — Therapy (Addendum)
[place], [place], [address] Phone: [phone]   Fax:  [phone]  Occupational Therapy Wheelchair Evaluation  Patient Details  Name: Stacey Summers MRN: [id number] Date of Birth: August 03, 1946 Referring Provider (OT): Ronnie Doss, DO   Encounter Date: 11/24/2020   OT End of Session - 11/24/20 1352    Visit Number 1    Number of Visits 1    Authorization Type [employer]    Authorization Time Period no copay no co insurance. visits based on medical necessity    OT Start Time [phone]    OT Stop Time 1100    OT Time Calculation (min) 67 min    Activity Tolerance Patient tolerated treatment well    Behavior During Therapy WFL for tasks assessed/performed           Past Medical History:  Diagnosis Date  . Allergy    seasonal  . Anxiety   . Arthritis    hands, back   . Cataract    surgery to remove  . Chronic back pain    lower back   . Cirrhosis of liver without ascites ([place])    recent dx 07/11/18  . COPD (chronic obstructive pulmonary disease) (HCC)    no inhaler  . Diabetes mellitus without complication ([place])   . DJD (degenerative joint disease)   . Fibromyalgia   . Genetic testing 03/08/2017   Ms. Quale underwent genetic counseling and testing for hereditary cancer syndromes on 02/21/2017. Her results were negative for mutations in all 83 genes analyzed by [facility]'s 83-gene Multi-Cancers Panel. Genes analyzed include: ALK, APC, ATM, AXIN2, BAP1, BARD1, BLM, BMPR1A, BRCA1, BRCA2, BRIP1, CASR, CDC73, CDH1, CDK4, CDKN1B, CDKN1C, CDKN2A, CEBPA, CHEK2, CTNNA1, DICER1, DIS3L2, EGFR, EPCAM  . GERD (gastroesophageal reflux disease)   . 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 History of abnormal mammogram   . Hyperlipidemia   . Hypertension   . Irregular heart beat   . Neuromuscular disorder (HCC)    fibromyalgia  . Sleep apnea    cpap some  . SVD (spontaneous vaginal delivery)     x 2  . Tubular adenoma of colon 05/09/12   "innumerable number" of flat polyps    Past Surgical History:  Procedure Laterality Date  . CARPAL TUNNEL RELEASE Left 10/20/14   [facility]  . CATARACT EXTRACTION, BILATERAL    . [place]  . CHOLECYSTECTOMY  1990  . COLONOSCOPY    . IR RADIOLOGIST EVAL & MGMT  07/16/2019  . KNEE ARTHROSCOPY WITH SUBCHONDROPLASTY Right 03/27/2018   Procedure: Right knee arthroscopic partial medial meniscus repair versus menisectomy with medial tibia subchondroplasty;  Surgeon: Nicholes Stairs, MD;  Location: [facility];  Service: Orthopedics;  Laterality: Right;  120 mins  . LARYNX SURGERY     POLYP REMOVAL  . MULTIPLE TOOTH EXTRACTIONS    . POLYPECTOMY     vocal cord  . TONSILLECTOMY  1965  . TUBAL LIGATION    . TUMOR REMOVAL     benign fatty tumors on abdomen  . UPPER GASTROINTESTINAL ENDOSCOPY      There were no vitals filed for this visit.   Subjective Assessment - 11/24/20 1341    Subjective  S: I feel in December    Patient is accompanied by: Family member   Stacey Summers Daughter            [facility] OT Assessment - 11/24/20 1341  Assessment   Medical Diagnosis Powered wheelchair evaluation    Referring Provider (OT) Ronnie Doss, DO    Onset Date/Surgical Date --   approximately december 2021     Precautions   Precautions Fall      Prior Function   Level of Independence Requires assistive device for independence                   PATIENT INFORMATION: Name: Stacey Summers DOB: Dec 13, 1945  Sex: Female Date seen: 11/24/20 Time: 9:53 AM  Address:  [place], [place] [address] Physician: Ronnie Doss, DO This evaluation/justification form will serve as the LMN for the following suppliers: __________________________ Supplier: [employer] Contact Person: Normand Sloop Phone:  [phone]   Seating Therapist: Deberah Pelton, [place] Phone:   [phone]   Phone: [phone]    Spouse/Parent/Caregiver  name: Stacey Summers, Sr. (spouse)  Phone number: [phone] Insurance/Payer: [employer]     Reason for Referral: Obtain a powered wheelchair  Patient/Caregiver Goals: To be able to move in her environment safely and independently while completing activities of daily living. ?????  Patient was seen for face-to-face evaluation for new power wheelchair.  Also present was Deberah Pelton, ATP to discuss recommendations and wheelchair options.  Further paperwork was completed and sent to vendor.  Patient appears to qualify for power mobility device at this time per objective findings.   MEDICAL HISTORY: Diagnosis: Primary Diagnosis: Chronic Pain Onset: several years Diagnosis: Diabetes Mellitus II, Thrombosytopenia, COPD, Anxiety, Arthritis (hands, back), Chronic Low Back pain, Cirrhosis of liver, DJD, Fibromyalgia, Hearing loss, Hyperlipidemia, irregular heart beat, Sleep apnea, Morbid obesity, Left knee OA, Left supraspinatus tear.   [] Progressive Disease Relevant past and future surgeries: 2016 left carpal tunnel release, 2020 right knee arthroscopic partial medial meniscus repair   Height: 5'2" Weight: 206lbs Explain recent changes or trends in weight: N/A   History including Falls: Recent fall in December 2021 which caused a left humeral head fracture and supraspinatus tear. She has had several close calls when ambulating at home.     HOME ENVIRONMENT: [x] House  [] Condo/town home  [] Apartment  [] Assisted Living    [] Lives Alone [x]  Lives with Others                                                                                          Hours with caregiver: ?????  [x] Home is accessible to patient           Stairs      [] Yes [x]  No     Ramp [x] Yes [] No Comments:  ?????   COMMUNITY ADL: TRANSPORTATION: [x] Car    [] Van    [] Public Transportation    [] Adapted w/c Lift    [] Ambulance    [] Other:       [] Sits in wheelchair during transport  Employment/School: ????? Specific  requirements pertaining to mobility ?????  Other: ?????    FUNCTIONAL/SENSORY PROCESSING SKILLS:  Handedness:   [x] Right     [] Left    [] NA  Comments:  ?????  Functional Processing Skills for Wheeled Mobility [x] Processing Skills are adequate for safe wheelchair operation  Areas of concern than may  interfere with safe operation of wheelchair Description of problem   []  Attention to environment      [] Judgment      [x]  Hearing  []  Vision or visual processing      [] Motor Planning  []  Fluctuations in Behavior  Patient uses bilateral hearing aids to manage hearing impairment.     VERBAL COMMUNICATION: [x] WFL receptive [x]  WFL expressive [x] Understandable  [] Difficult to understand  [] non-communicative []  Uses an augmented communication device  CURRENT SEATING / MOBILITY: Current Mobility Base:  [x] None [] Dependent [] Manual [] Scooter [] Power  Type of Control: ?????  Manufacturer:  ?????Size:  ?????Age: ?????  Current Condition of Mobility Base:  N/A   Current Wheelchair components:  N/A  Describe posture in present seating system:  N/A      SENSATION and SKIN ISSUES: Sensation [] Intact  [x] Impaired [] Absent  Level of sensation: Due to Fibromyalgia, patient reports occassional changes in sensation in her hands and feet.  Pressure Relief: Able to perform effective pressure relief :    [x] Yes  []  No Method: Performs a sit to stand to relieve pressure. If not, Why?: Due to BUE weakness, seated weight shifting ability is limited and not adequate to what is needed to prevent skin breakdown. At this time, patient is able to transition from sitting to standing in order to relieve pressure.   Skin Issues/Skin Integrity Current Skin Issues  [] Yes [x] No [] Intact []  Red area[]  Open Area  [] Scar Tissue [] At risk from prolonged sitting Where  ?????  History of Skin Issues  [] Yes [x] No Where  ????? When  ?????  Hx of skin flap surgeries  [] Yes [x] No Where  ????? When  ?????  Limited sitting  tolerance [x] Yes [] No Hours spent sitting in wheelchair daily: Pt reports that she must change her position every hour when sitting due to chronic low back pain.   Complaint of Pain:  Please describe: Pt reports chronic low back pain. 5/10 when inactive/sitting and increases to 8/10 when up walking.    Swelling/Edema: Grade 1 edema noted in BLE from knees down. Pt reports that her left LE is worse.     ADL STATUS (in reference to wheelchair use):  Indep Assist Unable Indep with Equip Not assessed Comments  Dressing []  []  []  [x]  []  Performs seated. Requires assistance with donning shoes if not slip on.   Eating [x]  []  []  []  []  ?????  Toileting [x]  []  []  []  []  Modified independent with increased time needed to complete. Toilet is handicap height and grab bars are located near toilet.   Bathing [x]  []  []  []  []  Completes bathing in walk-in shower, seated on shower chair with grab bars in shower. Increased time needed to complete. Will shower with family home incase she needs help.   Grooming/Hygiene []  [x]  []  []  []  Assist with washing her hair in the shower due to supraspinatus tear.   Meal Prep []  []  [x]  []  []  Family members complete meal prep.   IADLS []  []  [x]  []  []  Family members complete house keeping tasks.   Bowel Management: [] Continent  [] Incontinent  [x] Accidents Comments:  Due to medication  Bladder Management: [x] Continent  [] Incontinent  [] Accidents Comments:  ?????     WHEELCHAIR SKILLS: Manual w/c Propulsion: [] UE or LE strength and endurance sufficient to participate in ADLs using manual wheelchair Arm : [] left [] right   [] Both      Distance: ????? Foot:  [] left [] right   [] Both  Operate Scooter: []  Strength, hand grip, balance and transfer appropriate  for use [] Living environment is accessible for use of scooter  Operate Power w/c:  []  Std. Joystick   []  Alternative Controls Indep [x]  Assist []  Dependent/unable []  N/A []   [x] Safe          [x]  Functional      Distance: ?????   Bed confined without wheelchair []  Yes [x]  No   STRENGTH/RANGE OF MOTION:  Active Range of Motion Strength  Shoulder WFL BUE flexion, abduction, IR/er Bilateral abduction, flexion, external rotation: 4-/5 Bilateral internal rotation 3+/5  Elbow WFL BUE flexion and extension Bilateral flexion 4/5, extension 3+/5  Wrist/Hand WFL BUE flexion and extension Bilateral flexion/extension 4/5  Decreased bilateral gross grip  Hip WFL BLE flexion, abduction, adduction  Bilateral flexion 4-/5, extension 4/5, adduction 4-/5, abduction 4-/5  Knee WFL BLE flexion and extension Bilateral flexion and extension 4-/5  Ankle WFL BLE dorsiflexion and plantar flexion Bilateral dorsi flexion and plantar flexion 4+/5     MOBILITY/BALANCE:  []  Patient is totally dependent for mobility  ?????    Balance Transfers Ambulation  Sitting Balance: Standing Balance: [x]  Independent []  Independent/Modified Independent  [x]  WFL     []  WFL []  Supervision []  Supervision  []  Uses UE for balance  [x]  Supervision []  Min Assist []  Ambulates with Assist  ?????    []  Min Assist []  Min assist []  Mod Assist [x]  Ambulates with Device:      []  RW  []  StW  [x]  Cane  []  ?????  []  Mod Assist []  Mod assist []  Max assist   []  Max Assist []  Max assist []  Dependent []  Indep. Short Distance Only  []  Unable []  Unable []  Lift / Sling Required Distance (in feet)  ?????   []  Sliding board []  Unable to Ambulate (see explanation below)  Cardio Status:  [] Intact  [x]  Impaired   []  NA     history of irregular heartbeat  Respiratory Status:  [] Intact   [x] Impaired   [] NA     COPD  Orthotics/Prosthetics: N/A  Comments (Address manual vs power w/c vs scooter): Current home environment does not have the adequate space for turning radius that is required with a scooter. Due to decreased Bilateral UE strength, patient is unable to propell herself in a manual wheelchair and relies on others to complete.      Mobility/Balance Comments: patient is able  to complete all functional transfers from surface to surface such as chair to chair with modified independence. Prefers to push off of surface with bilateral UEs. When standing, she is able to demonstrate Good static standing balance although is unable face a challenge or go out of her base of support. She is able to stand and maintain her balance without UE assist although uses one or both arms for support due to fear of falling.  Completed Timed Up and Go Test: 27.49" with single point cane.  Completed test at a slow pace with small steps especially when navigating turn around to return to chair. Very small base of support was demonstrated.        Anterior / Posterior Obliquity Rotation-Pelvis ?????  PELVIS    [x]  []  []   Neutral Posterior Anterior  [x]  []  []   WFL Rt elev Lt elev  [x]  []  []   WFL Right Left                      Anterior    Anterior     []  Fixed []  Other []  Partly Flexible []   Flexible   []  Fixed []  Other []  Partly Flexible  []  Flexible  []  Fixed []  Other []  Partly Flexible  []  Flexible   TRUNK  [x]  []  []   WFL ? Thoracic ? Lumbar  Kyphosis Lordosis  [x]  []  []   WFL Convex Convex  Right Left [] c-curve [] s-curve [] multiple  [x]  Neutral []  Left-anterior []  Right-anterior     []  Fixed []  Flexible []  Partly Flexible []  Other  []  Fixed []  Flexible []  Partly Flexible []  Other  []  Fixed             []  Flexible []  Partly Flexible []  Other    Position Windswept  ?????  HIPS          [x]            []               []    Neutral       Abduct        ADduct         [x]           []            []   Neutral Right           Left      []  Fixed []  Subluxed []  Partly Flexible []  Dislocated []  Flexible  []  Fixed []  Other []  Partly Flexible  []  Flexible                 Foot Positioning Knee Positioning  ?????    [x]  WFL  [] Lt [] Rt [x]  WFL  [] Lt [] Rt    KNEES ROM concerns: ROM concerns:    & Dorsi-Flexed [] Lt [] Rt ?????    FEET Plantar Flexed [] Lt [] Rt      Inversion                  [] Lt [] Rt      Eversion                 [] Lt [] Rt     HEAD [x]  Functional [x]  Good Head Control  ?????  & []  Flexed         []  Extended []  Adequate Head Control    NECK []  Rotated  Lt  []  Lat Flexed Lt []  Rotated  Rt []  Lat Flexed Rt []  Limited Head Control     []  Cervical Hyperextension []  Absent  Head Control     SHOULDERS ELBOWS WRIST& HAND ?????      Left     Right    Left     Right    Left     Right   U/E [x] Functional           [x] Functional functional functional [] Fisting             [] Fisting      [] elev   [] dep      [] elev   [] dep       [] pro -[] retract     [] pro  [] retract [] subluxed             [] subluxed           Goals for Wheelchair Mobility  [x]  Independence with mobility in the home with motor related ADLs (MRADLs)  []  Independence with MRADLs in the community []  Provide dependent mobility  []  Provide recline     [] Provide tilt   Goals for Seating system [x]  Optimize pressure distribution [x]  Provide support needed to facilitate function or safety []   Provide corrective forces to assist with maintaining or improving posture []  Accommodate client's posture:   current seated postures and positions are not flexible or will not tolerate corrective forces [x]  Client to be independent with relieving pressure in the wheelchair [] Enhance physiological function such as breathing, swallowing, digestion  Simulation ideas/Equipment trials:????? State why other equipment was unsuccessful:?????   MOBILITY BASE RECOMMENDATIONS and JUSTIFICATION: MOBILITY COMPONENT JUSTIFICATION  Manufacturer: [employer] Model: Jazzy 614   Size: Width 21 Seat Depth 18  [x] provide transport from point A to B      [x] promote Indep mobility  [x] is not a safe, functional ambulator [x] walker or cane inadequate [] non-standard width/depth necessary to accommodate anatomical measurement []  ?????  [] Manual Mobility Base [] non-functional ambulator    [] Scooter/POV  [] can safely operate  [] can  safely transfer   [] has adequate trunk stability  [] cannot functionally propel manual w/c  [] Power Mobility Base  [] non-ambulatory  [] cannot functionally propel manual wheelchair  []  cannot functionally and safely operate scooter/POV [] can safely operate and willing to  [] Stroller Base [] infant/child  [] unable to propel manual wheelchair [] allows for growth [] non-functional ambulator [] non-functional UE [] Indep mobility is not a goal at this time  [] Tilt  [] Forward [] Backward [] Powered tilt  [] Manual tilt  [] change position against gravitational force on head and shoulders  [] change position for pressure relief/cannot weight shift [] transfers  [] management of tone [] rest periods [] control edema [] facilitate postural control  []  ?????  [] Recline  [] Power recline on power base [] Manual recline on manual base  [] accommodate femur to back angle  [] bring to full recline for ADL care  [] change position for pressure relief/cannot weight shift [] rest periods [] repositioning for transfers or clothing/diaper /catheter changes [] head positioning  [] Lighter weight required [] self- propulsion  [] lifting []  ?????  [] Heavy Duty required [] user weight greater than 250# [] extreme tone/ over active movement [] broken frame on previous chair []  ?????  []  Back  []  Angle Adjustable []  Custom molded ????? [] postural control [] control of tone/spasticity [] accommodation of range of motion [] UE functional control [] accommodation for seating system []  ????? [] provide lateral trunk support [] accommodate deformity [] provide posterior trunk support [] provide lumbar/sacral support [] support trunk in midline [] Pressure relief over spinal processes  []  Seat Cushion ????? [] impaired sensation  [] decubitus ulcers present [] history of pressure ulceration [] prevent pelvic extension [] low maintenance  [] stabilize pelvis  [] accommodate obliquity [] accommodate multiple deformity [] neutralize lower  extremity position [] increase pressure distribution []  ?????  []  Pelvic/thigh support  []  Lateral thigh guide []  Distal medial pad  []  Distal lateral pad []  pelvis in neutral [] accommodate pelvis []  position upper legs []  alignment []  accommodate ROM []  decr adduction [] accommodate tone [] removable for transfers [] decr abduction  []  Lateral trunk Supports []  Lt     []  Rt [] decrease lateral trunk leaning [] control tone [] contour for increased contact [] safety  [] accommodate asymmetry []  ?????  []  Mounting hardware  [] lateral trunk supports  [] back   [] seat [] headrest      []  thigh support [] fixed   [] swing away [] attach seat platform/cushion to w/c frame [] attach back cushion to w/c frame [] mount postural supports [] mount headrest  [] swing medial thigh support away [] swing lateral supports away for transfers  []  ?????    Armrests  [] fixed [x] adjustable height [] removable   [] swing away  [x] flip back   [] reclining [x] full length pads [] desk    [] pads tubular  [x] provide support with elbow at 90   [] provide support for w/c tray [] change of height/angles for variable activities [] remove for transfers [] allow to come closer to  table top [] remove for access to tables []  ?????  Hangers/ Leg rests  [] 60 [] 70 [] 90 [] elevating [] heavy duty  [] articulating [] fixed [] lift off [] swing away     [] power [] provide LE support  [] accommodate to hamstring tightness [] elevate legs during recline   [] provide change in position for Legs [] Maintain placement of feet on footplate [] durability [] enable transfers [] decrease edema [] Accommodate lower leg length []  ?????  Foot support Footplate    [] Lt  []  Rt  []  Center mount [] flip up     [] depth/angle adjustable [] Amputee adapter    []  Lt     []  Rt [] provide foot support [] accommodate to ankle ROM [] transfers [] Provide support for residual extremity []  allow foot to go under wheelchair base []  decrease tone  []  ?????  []  Ankle  strap/heel loops [] support foot on foot support [] decrease extraneous movement [] provide input to heel  [] protect foot  Tires: [] pneumatic  [] flat free inserts  [] solid  [] decrease maintenance  [] prevent frequent flats [] increase shock absorbency [] decrease pain from road shock [] decrease spasms from road shock []  ?????  []  Headrest  [] provide posterior head support [] provide posterior neck support [] provide lateral head support [] provide anterior head support [] support during tilt and recline [] improve feeding   [] improve respiration [] placement of switches [] safety  [] accommodate ROM  [] accommodate tone [] improve visual orientation  []  Anterior chest strap []  Vest []  Shoulder retractors  [] decrease forward movement of shoulder [] accommodation of TLSO [] decrease forward movement of trunk [] decrease shoulder elevation [] added abdominal support [] alignment [] assistance with shoulder control  []  ?????  Pelvic Positioner [] Belt [] SubASIS bar [] Dual Pull [] stabilize tone [] decrease falling out of chair/ **will not Decr potential for sliding due to pelvic tilting [] prevent excessive rotation [] pad for protection over boney prominence [] prominence comfort [] special pull angle to control rotation []  ?????  Upper Extremity Support [] L   []  R [] Arm trough    [] hand support []  tray       [] full tray [] swivel mount [] decrease edema      [] decrease subluxation   [] control tone   [] placement for AAC/Computer/EADL [] decrease gravitational pull on shoulders [] provide midline positioning [] provide support to increase UE function [] provide hand support in natural position [] provide work surface   POWER WHEELCHAIR CONTROLS  [] Proportional  [] Non-Proportional Type ????? [] Left  [] Right [] provides access for controlling wheelchair   [] lacks motor control to operate proportional drive control [] unable to understand proportional controls  Actuator Control Module  [] Single   [] Multiple   [] Allow the client to operate the power seat function(s) through the joystick control   [] Safety Reset Switches [] Used to change modes and stop the wheelchair when driving in latch mode    [] Upgraded Electronics   [] programming for accurate control [] progressive Disease/changing condition [] non-proportional drive control needed [] Needed in order to operate power seat functions through joystick control   [] Display box [] Allows user to see in which mode and drive the wheelchair is set  [] necessary for alternate controls    [] Digital interface electronics [] Allows w/c to operate when using alternative drive controls  [] ASL Head Array [] Allows client to operate wheelchair  through switches placed in tri-panel headrest  [] Sip and puff with tubing kit [] needed to operate sip and puff drive controls  [] Upgraded tracking electronics [] increase safety when driving [] correct tracking when on uneven surfaces  [x] Mount for switches or joystick [] Attaches switches to w/c  [x] Swing away for access or transfers [] midline for optimal placement [] provides for consistent access  [] Attendant controlled joystick plus mount [] safety [] long distance  driving [] operation of seat functions [] compliance with transportation regulations []  ?????    Rear wheel placement/Axle adjustability [] None [] semi adjustable [] fully adjustable  [] improved UE access to wheels [] improved stability [] changing angle in space for improvement of postural stability [] 1-arm drive access [] amputee pad placement []  ?????  Wheel rims/ hand rims  [] metal  [] plastic coated [] oblique projections [] vertical projections [] Provide ability to propel manual wheelchair  []  Increase self-propulsion with hand weakness/decreased grasp  Push handles [] extended  [] angle adjustable  [] standard [] caregiver access [] caregiver assist [] allows "hooking" to enable increased ability to perform ADLs or maintain balance  One armed device   [] Lt   [] Rt [] enable propulsion of manual wheelchair with one arm   []  ?????   Brake/wheel lock extension []  Lt   []  Rt [] increase indep in applying wheel locks   [] Side guards [] prevent clothing getting caught in wheel or becoming soiled []  prevent skin tears/abrasions  Battery: 22's x2 [x] to power wheelchair ?????  Other: Cane/crutch holder To transport cane while in powered wheelchair in order to use to transfer from powered wheelchair to desired surface.  ?????  The above equipment has a life- long use expectancy. Growth and changes in medical and/or functional conditions would be the exceptions. This is to certify that the therapist has no financial relationship with durable medical provider or manufacturer. The therapist will not receive remuneration of any kind for the equipment recommended in this evaluation.   Patient has mobility limitation that significantly impairs safe, timely participation in one or more mobility related ADL's.  (bathing, toileting, feeding, dressing, grooming, moving from room to room)                                                             [x]  Yes []  No Will mobility device sufficiently improve ability to participate and/or be aided in participation of MRADL's?         [x]  Yes []  No Can limitation be compensated for with use of a cane or walker?                                                                                []  Yes [x]  No Does patient or caregiver demonstrate ability/potential ability & willingness to safely use the mobility device?   [x]  Yes []  No Does patient's home environment support use of recommended mobility device?                                                    [x]  Yes []  No Does patient have sufficient upper extremity function necessary to functionally propel a manual wheelchair?    []  Yes [x]  No Does patient have sufficient strength and trunk stability to safely operate a POV (scooter)?                                  []   Yes [x]   No Does patient need additional features/benefits provided by a power wheelchair for MRADL's in the home?       [x]  Yes []  No Does the patient demonstrate the ability to safely use a power wheelchair?                                                              [x]  Yes []  No               Plan - 11/24/20 1353    OT Occupational Profile and History Problem Focused Assessment - Including review of records relating to presenting problem    Occupational performance deficits (Please refer to evaluation for details): ADL's    Body Structure / Function / Physical Skills Strength;ADL    Rehab Potential Excellent    Clinical Decision Making Limited treatment options, no task modification necessary    Comorbidities Affecting Occupational Performance: Presence of comorbidities impacting occupational performance    Comorbidities impacting occupational performance description: see medical chart    Modification or Assistance to Complete Evaluation  No modification of tasks or assist necessary to complete eval    OT Frequency One time visit    OT Treatment/Interventions Patient/family education    Plan P: 1 time visit for wheelchair evaluation.    Consulted and Agree with Plan of Care Patient           Patient will benefit from skilled therapeutic intervention in order to improve the following deficits and impairments:   Body Structure / Function / Physical Skills: Strength,ADL       Visit Diagnosis: Other symptoms and signs involving the musculoskeletal system - Plan: Ot plan of care cert/re-cert    Problem List Patient Active Problem List   Diagnosis Date Noted  . Left shoulder pain 08/12/2020  . Left knee pain 08/12/2020  . Encephalopathy, hepatic ([place]) 08/01/2019  . Thrombocytopenia ([place]) 07/05/2019  . Hyperlipidemia associated with type 2 diabetes mellitus ([place]) 04/02/2019  . Liver cirrhosis secondary to NASH ([place]) 07/11/2018  . Complex tear of medial meniscus of right knee  03/27/2018  . Stress fracture of right tibia 03/27/2018  . Chronic pain of right knee 12/19/2017  . Genetic testing 03/08/2017  . Restrictive lung disease secondary to obesity 08/04/2015  . Physical deconditioning 08/04/2015  . OSA treated with BiPAP 08/04/2015  . Gastroesophageal reflux disease without esophagitis 12/25/2014  . Low serum vitamin D 05/28/2014  . Osteopenia 05/28/2014  . COPD (chronic obstructive pulmonary disease) ([place]) 01/29/2014  . Dyspnea on exertion 01/07/2014  . Fatigue 03/03/2013  . Murmur, cardiac 03-Mar-2013  . Diabetes ([place]) 03/03/2013  . Morbid obesity ([place]) 03/03/2013  . Family history of sudden death 2013-03-03  . Hyperlipidemia 03/03/13  . Hypertension associated with diabetes ([place]) 12/20/2012  . GAD (generalized anxiety disorder) 12/20/2012  . Fibromyalgia muscle pain 12/20/2012    Ailene Ravel, OTR/L,CBIS  [phone]  11/24/2020, 1:58 PM  [facility] [address] [place], [place], [address] Phone: [phone]   Fax:  [phone]  Name: Stacey Summers MRN: [id number] Date of Birth: 07/24/1946

## 2020-12-09 ENCOUNTER — Other Ambulatory Visit: Payer: Self-pay

## 2020-12-09 ENCOUNTER — Other Ambulatory Visit: Payer: Self-pay | Admitting: Family Medicine

## 2020-12-09 ENCOUNTER — Ambulatory Visit (INDEPENDENT_AMBULATORY_CARE_PROVIDER_SITE_OTHER): Payer: PPO | Admitting: Family Medicine

## 2020-12-09 ENCOUNTER — Ambulatory Visit
Admission: RE | Admit: 2020-12-09 | Discharge: 2020-12-09 | Disposition: A | Discharge status: Home / Self Care | Payer: PPO | Source: Ambulatory Visit | Attending: Family Medicine | Admitting: Family Medicine

## 2020-12-09 ENCOUNTER — Encounter: Payer: Self-pay | Admitting: Family Medicine

## 2020-12-09 VITALS — BP 101/53 | HR 66 | Temp 98.1°F | Ht 62.0 in | Wt 214.0 lb

## 2020-12-09 DIAGNOSIS — K7581 Nonalcoholic steatohepatitis (NASH): Secondary | ICD-10-CM

## 2020-12-09 DIAGNOSIS — Z1231 Encounter for screening mammogram for malignant neoplasm of breast: Secondary | ICD-10-CM

## 2020-12-09 DIAGNOSIS — E1169 Type 2 diabetes mellitus with other specified complication: Secondary | ICD-10-CM

## 2020-12-09 DIAGNOSIS — I152 Hypertension secondary to endocrine disorders: Secondary | ICD-10-CM | POA: Diagnosis not present

## 2020-12-09 DIAGNOSIS — E785 Hyperlipidemia, unspecified: Secondary | ICD-10-CM

## 2020-12-09 DIAGNOSIS — F411 Generalized anxiety disorder: Secondary | ICD-10-CM

## 2020-12-09 DIAGNOSIS — K7460 Unspecified cirrhosis of liver: Secondary | ICD-10-CM | POA: Diagnosis not present

## 2020-12-09 DIAGNOSIS — E1159 Type 2 diabetes mellitus with other circulatory complications: Secondary | ICD-10-CM | POA: Diagnosis not present

## 2020-12-09 LAB — BAYER DCA HB A1C WAIVED: HB A1C (BAYER DCA - WAIVED): 6.8 % (ref <7.0)

## 2020-12-09 MED ORDER — BENAZEPRIL HCL 20 MG PO TABS: 20.0000 mg | ORAL_TABLET | Freq: Every day | ORAL | 1 refills | Status: DC

## 2020-12-09 MED ORDER — LORAZEPAM 0.5 MG PO TABS: 0.2500 mg | ORAL_TABLET | Freq: Two times a day (BID) | ORAL | 1 refills | Status: DC | PRN

## 2020-12-09 NOTE — Progress Notes (Signed)
Subjective: CC: DM PCP: Stacey Norlander, DO Stacey Summers is a 75 y.o. female presenting to clinic today for:  1. Type 2 Diabetes with hypertension, hyperlipidemia:  Patient is accompanied today by her daughter.  She has been compliant with all of her medications.  She has had some intermittent dizziness and overall fatigability.  Her daughter notes that she seems to be sleeping just a little more than her normal.  Last eye exam: Scheduled for May Last foot exam: Up-to-date Last A1c:  Lab Results  Component Value Date   HGBA1C 6.1 07/20/2020   Nephropathy screen indicated?:  Up-to-date Last flu, zoster and/or pneumovax:  Immunization History  Administered Date(s) Administered  . Hepb-cpg 07/19/2018, 08/20/2018  . Influenza, High Dose Seasonal PF 08/03/2018  . Influenza,inj,Quad PF,6+ Mos 05/28/2014, 06/16/2015, 06/10/2016, 08/05/2020  . Influenza,inj,quad, With Preservative 05/15/2017, 08/03/2018  . Influenza-Unspecified 04/24/2017  . Pneumococcal Conjugate-13 07/21/2014  . Pneumococcal Polysaccharide-23 09/21/2015  . Tdap 07/07/2009  . Zoster Recombinat (Shingrix) 04/24/2017, 07/11/2017   2.  Anxiety disorder/cirrhosis of liver Patient with intermittent anxiety and panic disorder.  She has history of cirrhosis of the liver and is subsequently on Xifaxan and as needed lactulose.  She has been using as needed Ativan if needed for breakthrough panic.  She uses this very sparingly and in fact still has a few tablets left but her current prescription is about to expire since it was written quite a while ago.  She denies any excessive daytime sedation with this and again uses it very cautiously.   ROS: Per HPI  Allergies  Allergen Reactions  . Latex     "my hands feels numb"   Past Medical History:  Diagnosis Date  . Allergy    seasonal  . Anxiety   . Arthritis    hands, back   . Cataract    surgery to remove  . Chronic back pain    lower back   .  Cirrhosis of liver without ascites ([place])    recent dx 07/11/18  . COPD (chronic obstructive pulmonary disease) ([facility])    no inhaler  . Diabetes mellitus without complication ([place])   . DJD (degenerative joint disease)   . Fibromyalgia   . Genetic testing 03/08/2017   Stacey Summers underwent genetic counseling and testing for hereditary cancer syndromes on 02/21/2017. Her results were negative for mutations in all 83 genes analyzed by [facility]'s 83-gene Multi-Cancers Panel. Genes analyzed include: ALK, APC, ATM, AXIN2, BAP1, BARD1, BLM, BMPR1A, BRCA1, BRCA2, BRIP1, CASR, CDC73, CDH1, CDK4, CDKN1B, CDKN1C, CDKN2A, CEBPA, CHEK2, CTNNA1, DICER1, DIS3L2, EGFR, EPCAM  . GERD (gastroesophageal reflux disease)   . 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 History of abnormal mammogram   . Hyperlipidemia   . Hypertension   . Irregular heart beat   . Neuromuscular disorder ([facility])    fibromyalgia  . Sleep apnea    cpap some  . SVD (spontaneous vaginal delivery)    x 2  . Tubular adenoma of colon 05/09/12   "innumerable number" of flat polyps    Current Outpatient Medications:  .  albuterol (VENTOLIN HFA) 108 (90 Base) MCG/ACT inhaler, Inhale 2 puffs into the lungs every 6 (six) hours as needed for wheezing or shortness of breath., Disp: 18 g, Rfl: 0 .  atorvastatin (LIPITOR) 40 MG tablet, Take 1 tablet (40 mg total) by mouth daily., Disp: 90 tablet, Rfl: 3 .  benazepril (LOTENSIN) 40 MG tablet, Take 1 tablet by  mouth once daily, Disp: 90 tablet, Rfl: 0 .  blood glucose meter kit and supplies KIT, Dispense based on patient and insurance preference. Use up to four times daily as directed. (FOR ICD-9 250.00, 250.01)., Disp: 1 each, Rfl: 10 .  Blood Glucose Monitoring Suppl (LIBERTY BLOOD GLUCOSE MONITOR) DEVI, 1 each by Does not apply route daily. Test 1x per day and prn-dx250.02, Disp: 1 each, Rfl: 0 .  Cholecalciferol (VITAMIN D3) 5000 units CAPS, Take 5,000 Units by mouth daily., Disp: , Rfl:  .   cyclobenzaprine (FLEXERIL) 5 MG tablet, TAKE 1 TABLET BY MOUTH THREE TIMES DAILY AS NEEDED FOR MUSCLE SPASMS (NEEDS APPOINTMENT), Disp: 30 tablet, Rfl: 0 .  diltiazem (CARDIZEM CD) 120 MG 24 hr capsule, Take 1 capsule by mouth once daily, Disp: 90 capsule, Rfl: 1 .  fluticasone (FLONASE) 50 MCG/ACT nasal spray, Place 1 spray into both nostrils in the morning and at bedtime., Disp: 16 g, Rfl: 6 .  furosemide (LASIX) 40 MG tablet, TAKE 1 TABLET BY MOUTH IN THE MORNING AND 1/2 (ONE-HALF) TABLET AT 1 PM, Disp: 135 tablet, Rfl: 0 .  glucose blood (ONETOUCH VERIO) test strip, Check Bs up to 4 times daily Dx E11.9, Disp: 400 each, Rfl: 3 .  glucose blood test strip, Test 1x per day and prn- dx 250.02, Disp: 100 each, Rfl: 2 .  lactulose (CHRONULAC) 10 GM/15ML solution, Take 15 ml three times a day and as needed, Disp: 946 mL, Rfl: 11 .  LORazepam (ATIVAN) 0.5 MG tablet, Take 0.5-1 tablets (0.25-0.5 mg total) by mouth 2 (two) times daily as needed for anxiety., Disp: 30 tablet, Rfl: 1 .  meclizine (ANTIVERT) 25 MG tablet, Take 1 tablet (25 mg total) by mouth 3 (three) times daily as needed for dizziness., Disp: 30 tablet, Rfl: 0 .  metFORMIN (GLUCOPHAGE) 500 MG tablet, Take 1 tablet (500 mg total) by mouth 2 (two) times daily with a meal., Disp: 180 tablet, Rfl: 3 .  methylPREDNISolone (MEDROL DOSEPAK) 4 MG TBPK tablet, Use as directed., Disp: 21 each, Rfl: 0 .  ondansetron (ZOFRAN) 4 MG tablet, Take 1 tablet (4 mg total) by mouth every 8 (eight) hours as needed for nausea or vomiting., Disp: 30 tablet, Rfl: 1 .  ONETOUCH DELICA LANCETS 21Y MISC, USE ONE LANCET TO CHECK GLUCOSE ONCE DAILY AND AS NEEDED, Disp: 100 each, Rfl: 0 .  traMADol (ULTRAM) 50 MG tablet, Take 1 tablet (50 mg total) by mouth every 12 (twelve) hours as needed for moderate pain., Disp: 30 tablet, Rfl: 2 .  XIFAXAN 550 MG TABS tablet, Take 1 tablet by mouth twice daily, Disp: 60 tablet, Rfl: 0 Social History   Socioeconomic History  .  Marital status: Married    Spouse name: [name]  . Number of children: 3  . Years of education: 41  . Highest education level: GED or equivalent  Occupational History  . Occupation: retired  Tobacco Use  . Smoking status: Former Smoker    Packs/day: 1.00    Years: 3.00    Pack years: 3.00    Types: Cigarettes    Quit date: 08/15/1969    Years since quitting: 51.3  . Smokeless tobacco: Never Used  Vaping Use  . Vaping Use: Never used  Substance and Sexual Activity  . Alcohol use: No    Alcohol/week: 0.0 standard drinks  . Drug use: No  . Sexual activity: Not Currently  Other Topics Concern  . Not on file  Social History Narrative  .  Not on file   Social Determinants of Health   Financial Resource Strain: Low Risk   . Difficulty of Paying Living Expenses: Not hard at all  Food Insecurity: No Food Insecurity  . Worried About Charity fundraiser in the Last Year: Never true  . Ran Out of Food in the Last Year: Never true  Transportation Needs: No Transportation Needs  . Lack of Transportation (Medical): No  . Lack of Transportation (Non-Medical): No  Physical Activity: Inactive  . Days of Exercise per Week: 0 days  . Minutes of Exercise per Session: 0 min  Stress: No Stress Concern Present  . Feeling of Stress : Only a little  Social Connections: Socially Integrated  . Frequency of Communication with Friends and Family: More than three times a week  . Frequency of Social Gatherings with Friends and Family: More than three times a week  . Attends Religious Services: More than 4 times per year  . Active Member of Clubs or Organizations: Yes  . Attends Archivist Meetings: More than 4 times per year  . Marital Status: Married  Human resources officer Violence: Not At Risk  . Fear of Current or Ex-Partner: No  . Emotionally Abused: No  . Physically Abused: No  . Sexually Abused: No   Family History  Problem Relation Age of Onset  . Prostate cancer Father        d.89  diagnosed in late-60s  . Colon cancer Father        diagnosed at 30  . Heart failure Mother        d.37  . Heart disease Mother   . Colon polyps Brother        polyposis. Underwent partial colectomy at age 57.  Marland Kitchen Spina bifida Brother        died at birth  . Stomach cancer Paternal Aunt        questionable  . Kidney cancer Cousin        d.78s maternal first-cousin  . Other Other 16       multiple lipomas  . Colon cancer Paternal Uncle        in his 62s  . Heart failure Maternal Grandmother        d.45  . Heart failure Maternal Uncle        d.50  . Colon polyps Son 45  . Heart attack Son 37  . Leukemia Maternal Aunt   . Esophageal cancer Neg Hx   . Rectal cancer Neg Hx     Objective: Office vital signs reviewed. BP (!) 101/53   Pulse 66   Temp 98.1 F (36.7 C)   Ht 5' 2"  (1.575 m)   Wt 214 lb (97.1 kg)   SpO2 99%   BMI 39.14 kg/m   Physical Examination:  General: Awake, alert, well nourished, No acute distress HEENT: No jaundice.  Sclera white. Cardio: Slightly bradycardic rate and regular rhythm, S1S2 heard, soft systolic murmurs appreciated Pulm: clear to auscultation bilaterally, no wheezes, rhonchi or rales; normal work of breathing on room air Extremities: warm, well perfused, trace to +1 edema to mid shin, no cyanosis or clubbing; +2 pulses bilaterally Neuro: Alert and oriented.  No tremor  Assessment/ Plan: 75 y.o. female   Type 2 diabetes mellitus with other specified complication, without long-term current use of insulin ([place]) - Plan: Bayer DCA Hb A1c Waived  Hypertension associated with diabetes ([place]) - Plan: CMP14+EGFR, benazepril (LOTENSIN) 20 MG tablet  Hyperlipidemia associated  with type 2 diabetes mellitus ([place]) - Plan: CMP14+EGFR  Liver cirrhosis secondary to NASH ([place]) - Plan: CMP14+EGFR, CBC with Differential/Platelet  GAD (generalized anxiety disorder) - Plan: LORazepam (ATIVAN) 0.5 MG tablet  Sugars under excellent control at 6.8.  No  changes to medications needed.  She will have diabetic eye exam performed soon  Blood pressure is at goal and in fact is a little on the low side in my opinion for this patient.  She is having what sounds like some orthostasis and therefore I am backing down on her benazepril to 20 mg daily.  Both she and her daughter are aware of the change in medication.  We may consider stopping this medication entirely if blood pressure is still low.  We discussed parameters and she will contact me if the blood pressure does not come back in normal range  She demonstrated trace to +1 pedal edema today.  Overall her mentation seems to be fairly clear.  I wonder if the increase in sleeping as a result of low blood pressure as above.  Continue current medications as outlined by hematology  Ativan renewed for as needed use.  National narcotic database was reviewed and there were no red flags.  Continue to use medication extremely sparingly.    No orders of the defined types were placed in this encounter.  No orders of the defined types were placed in this encounter.    Stacey Norlander, DO [place] [phone]

## 2020-12-10 LAB — CMP14+EGFR
ALT: 35 IU/L — ABNORMAL HIGH (ref 0–32)
AST: 55 IU/L — ABNORMAL HIGH (ref 0–40)
Albumin/Globulin Ratio: 1.3 (ref 1.2–2.2)
Albumin: 3.2 g/dL — ABNORMAL LOW (ref 3.7–4.7)
Alkaline Phosphatase: 117 IU/L (ref 44–121)
BUN/Creatinine Ratio: 23 (ref 12–28)
BUN: 18 mg/dL (ref 8–27)
Bilirubin Total: 2.5 mg/dL — ABNORMAL HIGH (ref 0.0–1.2)
CO2: 27 mmol/L (ref 20–29)
Calcium: 8.8 mg/dL (ref 8.7–10.3)
Chloride: 104 mmol/L (ref 96–106)
Creatinine, Ser: 0.78 mg/dL (ref 0.57–1.00)
Globulin, Total: 2.4 g/dL (ref 1.5–4.5)
Glucose: 152 mg/dL — ABNORMAL HIGH (ref 65–99)
Potassium: 4.2 mmol/L (ref 3.5–5.2)
Sodium: 146 mmol/L — ABNORMAL HIGH (ref 134–144)
Total Protein: 5.6 g/dL — ABNORMAL LOW (ref 6.0–8.5)
eGFR: 81 mL/min/{1.73_m2} (ref >59)

## 2020-12-10 LAB — CBC WITH DIFFERENTIAL/PLATELET
Basophils Absolute: 0 10*3/uL (ref 0.0–0.2)
Basos: 0 %
EOS (ABSOLUTE): 0.2 10*3/uL (ref 0.0–0.4)
Eos: 3 %
Hematocrit: 38.5 % (ref 34.0–46.6)
Hemoglobin: 13.1 g/dL (ref 11.1–15.9)
Immature Grans (Abs): 0 10*3/uL (ref 0.0–0.1)
Immature Granulocytes: 0 %
Lymphocytes Absolute: 2.1 10*3/uL (ref 0.7–3.1)
Lymphs: 38 %
MCH: 33.3 pg — ABNORMAL HIGH (ref 26.6–33.0)
MCHC: 34 g/dL (ref 31.5–35.7)
MCV: 98 fL — ABNORMAL HIGH (ref 79–97)
Monocytes Absolute: 0.5 10*3/uL (ref 0.1–0.9)
Monocytes: 9 %
Neutrophils Absolute: 2.7 10*3/uL (ref 1.4–7.0)
Neutrophils: 50 %
Platelets: 72 10*3/uL — CL (ref 150–450)
RBC: 3.93 x10E6/uL (ref 3.77–5.28)
RDW: 13 % (ref 11.7–15.4)
WBC: 5.6 10*3/uL (ref 3.4–10.8)

## 2020-12-16 ENCOUNTER — Other Ambulatory Visit (HOSPITAL_COMMUNITY): Payer: Self-pay

## 2020-12-16 DIAGNOSIS — D696 Thrombocytopenia, unspecified: Secondary | ICD-10-CM

## 2020-12-17 ENCOUNTER — Other Ambulatory Visit: Payer: Self-pay

## 2020-12-17 ENCOUNTER — Inpatient Hospital Stay (HOSPITAL_COMMUNITY): Payer: PPO | Attending: Hematology

## 2020-12-17 DIAGNOSIS — D696 Thrombocytopenia, unspecified: Secondary | ICD-10-CM | POA: Diagnosis not present

## 2020-12-17 LAB — CBC WITH DIFFERENTIAL/PLATELET
Abs Immature Granulocytes: 0.02 10*3/uL (ref 0.00–0.07)
Basophils Absolute: 0 10*3/uL (ref 0.0–0.1)
Basophils Relative: 1 %
Eosinophils Absolute: 0.3 10*3/uL (ref 0.0–0.5)
Eosinophils Relative: 5 %
HCT: 39.8 % (ref 36.0–46.0)
Hemoglobin: 13.2 g/dL (ref 12.0–15.0)
Immature Granulocytes: 0 %
Lymphocytes Relative: 34 %
Lymphs Abs: 2.1 10*3/uL (ref 0.7–4.0)
MCH: 33.1 pg (ref 26.0–34.0)
MCHC: 33.2 g/dL (ref 30.0–36.0)
MCV: 99.7 fL (ref 80.0–100.0)
Monocytes Absolute: 0.5 10*3/uL (ref 0.1–1.0)
Monocytes Relative: 8 %
Neutro Abs: 3.1 10*3/uL (ref 1.7–7.7)
Neutrophils Relative %: 52 %
Platelets: 100 10*3/uL — ABNORMAL LOW (ref 150–400)
RBC: 3.99 MIL/uL (ref 3.87–5.11)
RDW: 13.9 % (ref 11.5–15.5)
WBC: 6 10*3/uL (ref 4.0–10.5)
nRBC: 0 % (ref 0.0–0.2)

## 2020-12-17 LAB — COMPREHENSIVE METABOLIC PANEL
ALT: 47 U/L — ABNORMAL HIGH (ref 0–44)
AST: 63 U/L — ABNORMAL HIGH (ref 15–41)
Albumin: 2.9 g/dL — ABNORMAL LOW (ref 3.5–5.0)
Alkaline Phosphatase: 133 U/L — ABNORMAL HIGH (ref 38–126)
Anion gap: 6 (ref 5–15)
BUN: 31 mg/dL — ABNORMAL HIGH (ref 8–23)
CO2: 29 mmol/L (ref 22–32)
Calcium: 9 mg/dL (ref 8.9–10.3)
Chloride: 106 mmol/L (ref 98–111)
Creatinine, Ser: 0.94 mg/dL (ref 0.44–1.00)
GFR, Estimated: >60 mL/min (ref >60)
Glucose, Bld: 180 mg/dL — ABNORMAL HIGH (ref 70–99)
Potassium: 4.5 mmol/L (ref 3.5–5.1)
Sodium: 141 mmol/L (ref 135–145)
Total Bilirubin: 2.4 mg/dL — ABNORMAL HIGH (ref 0.3–1.2)
Total Protein: 5.8 g/dL — ABNORMAL LOW (ref 6.5–8.1)

## 2020-12-17 LAB — LACTATE DEHYDROGENASE: LDH: 188 U/L (ref 98–192)

## 2020-12-17 LAB — VITAMIN B12: Vitamin B-12: 367 pg/mL (ref 180–914)

## 2020-12-17 LAB — VITAMIN D 25 HYDROXY (VIT D DEFICIENCY, FRACTURES): Vit D, 25-Hydroxy: 42.93 ng/mL (ref 30–100)

## 2020-12-24 ENCOUNTER — Inpatient Hospital Stay (HOSPITAL_BASED_OUTPATIENT_CLINIC_OR_DEPARTMENT_OTHER): Payer: PPO | Admitting: Physician Assistant

## 2020-12-24 DIAGNOSIS — D696 Thrombocytopenia, unspecified: Secondary | ICD-10-CM | POA: Diagnosis not present

## 2020-12-24 NOTE — Progress Notes (Signed)
Virtual Visit via Telephone Note [facility]  I connected with Stacey Summers on 12/24/20  at  11:40 AM  by telephone and verified that I am speaking with the correct person using two identifiers.  Location: Patient: Home Provider: [facility]   I discussed the limitations, risks, security and privacy concerns of performing an evaluation and management service by telephone and the availability of in person appointments. I also discussed with the patient that there may be a patient responsible charge related to this service. The patient expressed understanding and agreed to proceed.   History of Present Illness: Stacey Summers is contacted today for follow-up of her thrombocytopenia.  She was last contacted for telephone visit by NP Faythe Casa on 09/10/2020.  Labs from 12/18/2018 with patient, significant for platelets 100 and abnormal LFTs in keeping with her chronic cirrhosis (albumin 2.9, AST 63, ALT 47, alk phos 133, bilirubin 2.4).  LDH, B12, and vitamin D were within normal limits.  Since her last visit, patient reports that she is doing fair.  She has chronic fatigue related to her multiple comorbidities, but reports that she is at baseline.  She reports energy level 50%, dyspnea on exertion, and chronic fatigue.  She states that she stays cold all the time and has occasional dizzy spells.  Intermittent nausea but without vomiting or abdominal pain.  Chronic dependent edema of bilateral lower extremities.  Cough attributed to seasonal allergies.  Regarding her thrombocytopenia, she denies any current signs or symptoms of bleeding.  No melena, hematochezia, hematemesis, hemoptysis, hematuria, epistaxis.  She continues to follow closely with her PCP for her multiple comorbidities, follows with gastroenterology for her cirrhosis.    Observations/Objective: Review of Systems  Constitutional: Positive for chills and malaise/fatigue. Negative for  diaphoresis, fever and weight loss.  HENT: Positive for congestion.   Respiratory: Positive for cough and shortness of breath (with exertion). Negative for hemoptysis.   Cardiovascular: Positive for leg swelling. Negative for chest pain and palpitations.  Gastrointestinal: Positive for nausea. Negative for abdominal pain and vomiting.  Musculoskeletal: Positive for joint pain.  Neurological: Positive for dizziness.     PHYSICAL EXAM (per limitations of virtual telephone visit): The patient is alert and oriented x 3, exhibiting adequate mentation, good mood, and ability to speak in full sentences and execute sound judgement.   ASSESSMENT: 1. Thrombocytopenia: -Platelet count is ranging anywhere between 70-140.  Most recent platelets (12/17/2020) at 100. -CT of the AP on 06/27/2018 showed spleen is mildly enlarged. She has cirrhosis of the liver. -We reviewed labs which showed platelet count 100. B12 was normal. 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 Lung nodules: -CT of the chest from 10/04/2019 which showed resolution of recent COVID-19 pneumonia. Bilateral pulmonary nodules, less than 4 mm in size have been stable. -She is a non-smoker and has not smoked cigarettes since 1971.  - Consider possible follow-up CT at next appointment to ensure stability of nodules - if unchanged in size, no further imaging will be necessary    PLAN:  1. Thrombocytopenia: -Repeat CBC and follow-up in 3 months  -If the platelet count drops below 30,000, splenic artery embolization can be considered.   Disposition: -We will see her back in 3 months for follow-up with repeat labs (CBC, CMP, LDH, Vit B12, MMA)     I  discussed the assessment and treatment plan with the patient. The patient was provided an opportunity to ask questions and all were answered. The patient agreed with the plan  and demonstrated an understanding of the instructions.   The patient was advised to call back or seek an in-person evaluation if the symptoms worsen or if the condition fails to improve as anticipated.  I provided 15 minutes of non-face-to-face time during this encounter.   Harriett Rush, PA-C

## 2021-01-04 ENCOUNTER — Other Ambulatory Visit: Payer: Self-pay | Admitting: Family Medicine

## 2021-01-04 DIAGNOSIS — J301 Allergic rhinitis due to pollen: Secondary | ICD-10-CM

## 2021-02-01 ENCOUNTER — Ambulatory Visit (INDEPENDENT_AMBULATORY_CARE_PROVIDER_SITE_OTHER): Payer: PPO

## 2021-02-01 VITALS — Ht 62.0 in | Wt 214.0 lb

## 2021-02-01 DIAGNOSIS — Z0000 Encounter for general adult medical examination without abnormal findings: Secondary | ICD-10-CM | POA: Diagnosis not present

## 2021-02-01 NOTE — Patient Instructions (Signed)
[name] , Thank you for taking time to come for your Medicare Wellness Visit. I appreciate your ongoing commitment to your health goals. Please review the following plan we discussed and let me know if I can assist you in the future.   Screening recommendations/referrals: Colonoscopy: Done 08/07/2018 - Repeat in 3 years Mammogram: Done 12/09/2020 - Repeat annually Bone Density: Done 06/26/2017 - Repeat every 2 years *Do at next visit Recommended yearly ophthalmology/optometry visit for glaucoma screening and checkup Recommended yearly dental visit for hygiene and checkup  Vaccinations: Influenza vaccine: Done 08/05/2020 - Repeat annually Pneumococcal vaccine: Done 07/21/2014 & 09/21/2015 Tdap vaccine: Done 07/07/2009 - Repeat in 10 years *past due Shingles vaccine: Done 04/24/2017 & 07/11/2017   Covid-19:Declined  Advanced directives: Advance directive discussed with you today. I have provided a copy for you to complete at home and have notarized. Once this is complete please bring a copy in to our office so we can scan it into your chart.  Conditions/risks identified: Aim for 30 minutes of exercise each day, drink 6-8 glasses of water and eat lots of fruits and vegetables.  Next appointment: Follow up in one year for your annual wellness visit    Preventive Care 65 Years and Older, Female Preventive care refers to lifestyle choices and visits with your health care provider that can promote health and wellness. What does preventive care include? A yearly physical exam. This is also called an annual well check. Dental exams once or twice a year. Routine eye exams. Ask your health care provider how often you should have your eyes checked. Personal lifestyle choices, including: Daily care of your teeth and gums. Regular physical activity. Eating a healthy diet. Avoiding tobacco and drug use. Limiting alcohol use. Practicing safe sex. Taking low-dose aspirin every day. Taking  vitamin and mineral supplements as recommended by your health care provider. What happens during an annual well check? The services and screenings done by your health care provider during your annual well check will depend on your age, overall health, lifestyle risk factors, and family history of disease. Counseling  Your health care provider may ask you questions about your: Alcohol use. Tobacco use. Drug use. Emotional well-being. Home and relationship well-being. Sexual activity. Eating habits. History of falls. Memory and ability to understand (cognition). Work and work Statistician. Reproductive health. Screening  You may have the following tests or measurements: Height, weight, and BMI. Blood pressure. Lipid and cholesterol levels. These may be checked every 5 years, or more frequently if you are over 76 years old. Skin check. Lung cancer screening. You may have this screening every year starting at age 35 if you have a 30-pack-year history of smoking and currently smoke or have quit within the past 15 years. Fecal occult blood test (FOBT) of the stool. You may have this test every year starting at age 48. Flexible sigmoidoscopy or colonoscopy. You may have a sigmoidoscopy every 5 years or a colonoscopy every 10 years starting at age 51. Hepatitis C blood test. Hepatitis B blood test. Sexually transmitted disease (STD) testing. Diabetes screening. This is done by checking your blood sugar (glucose) after you have not eaten for a while (fasting). You may have this done every 1-3 years. Bone density scan. This is done to screen for osteoporosis. You may have this done starting at age 74. Mammogram. This may be done every 1-2 years. Talk to your health care provider about how often you should have regular mammograms. Talk with your health  care provider about your test results, treatment options, and if necessary, the need for more tests. Vaccines  Your health care provider may  recommend certain vaccines, such as: Influenza vaccine. This is recommended every year. Tetanus, diphtheria, and acellular pertussis (Tdap, Td) vaccine. You may need a Td booster every 10 years. Zoster vaccine. You may need this after age 28. Pneumococcal 13-valent conjugate (PCV13) vaccine. One dose is recommended after age 22. Pneumococcal polysaccharide (PPSV23) vaccine. One dose is recommended after age 43. Talk to your health care provider about which screenings and vaccines you need and how often you need them. This information is not intended to replace advice given to you by your health care provider. Make sure you discuss any questions you have with your health care provider. Document Released: 08/28/2015 Document Revised: 04/20/2016 Document Reviewed: 06/02/2015 Elsevier Interactive Patient Education  2017 [place] Prevention in the Home Falls can cause injuries. They can happen to people of all ages. There are many things you can do to make your home safe and to help prevent falls. What can I do on the outside of my home? Regularly fix the edges of walkways and driveways and fix any cracks. Remove anything that might make you trip as you walk through a door, such as a raised step or threshold. Trim any bushes or trees on the path to your home. Use bright outdoor lighting. Clear any walking paths of anything that might make someone trip, such as rocks or tools. Regularly check to see if handrails are loose or broken. Make sure that both sides of any steps have handrails. Any raised decks and porches should have guardrails on the edges. Have any leaves, snow, or ice cleared regularly. Use sand or salt on walking paths during winter. Clean up any spills in your garage right away. This includes oil or grease spills. What can I do in the bathroom? Use night lights. Install grab bars by the toilet and in the tub and shower. Do not use towel bars as grab bars. Use non-skid  mats or decals in the tub or shower. If you need to sit down in the shower, use a plastic, non-slip stool. Keep the floor dry. Clean up any water that spills on the floor as soon as it happens. Remove soap buildup in the tub or shower regularly. Attach bath mats securely with double-sided non-slip rug tape. Do not have throw rugs and other things on the floor that can make you trip. What can I do in the bedroom? Use night lights. Make sure that you have a light by your bed that is easy to reach. Do not use any sheets or blankets that are too big for your bed. They should not hang down onto the floor. Have a firm chair that has side arms. You can use this for support while you get dressed. Do not have throw rugs and other things on the floor that can make you trip. What can I do in the kitchen? Clean up any spills right away. Avoid walking on wet floors. Keep items that you use a lot in easy-to-reach places. If you need to reach something above you, use a strong step stool that has a grab bar. Keep electrical cords out of the way. Do not use floor polish or wax that makes floors slippery. If you must use wax, use non-skid floor wax. Do not have throw rugs and other things on the floor that can make you trip. What can  I do with my stairs? Do not leave any items on the stairs. Make sure that there are handrails on both sides of the stairs and use them. Fix handrails that are broken or loose. Make sure that handrails are as long as the stairways. Check any carpeting to make sure that it is firmly attached to the stairs. Fix any carpet that is loose or worn. Avoid having throw rugs at the top or bottom of the stairs. If you do have throw rugs, attach them to the floor with carpet tape. Make sure that you have a light switch at the top of the stairs and the bottom of the stairs. If you do not have them, ask someone to add them for you. What else can I do to help prevent falls? Wear shoes  that: Do not have high heels. Have rubber bottoms. Are comfortable and fit you well. Are closed at the toe. Do not wear sandals. If you use a stepladder: Make sure that it is fully opened. Do not climb a closed stepladder. Make sure that both sides of the stepladder are locked into place. Ask someone to hold it for you, if possible. Clearly mark and make sure that you can see: Any grab bars or handrails. First and last steps. Where the edge of each step is. Use tools that help you move around (mobility aids) if they are needed. These include: Canes. Walkers. Scooters. Crutches. Turn on the lights when you go into a dark area. Replace any light bulbs as soon as they burn out. Set up your furniture so you have a clear path. Avoid moving your furniture around. If any of your floors are uneven, fix them. If there are any pets around you, be aware of where they are. Review your medicines with your doctor. Some medicines can make you feel dizzy. This can increase your chance of falling. Ask your doctor what other things that you can do to help prevent falls. This information is not intended to replace advice given to you by your health care provider. Make sure you discuss any questions you have with your health care provider. Document Released: 05/28/2009 Document Revised: 01/07/2016 Document Reviewed: 09/05/2014 Elsevier Interactive Patient Education  2017 [employer].

## 2021-02-01 NOTE — Progress Notes (Signed)
Subjective:   Stacey Summers is a 75 y.o. female who presents for Medicare Annual (Subsequent) preventive examination.  Virtual Visit via Telephone Note  I connected with  Stacey Summers on 02/01/21 at 11:15 AM EDT by telephone and verified that I am speaking with the correct person using two identifiers.  Location: Patient: Home Provider: [facility] Persons participating in the virtual visit: patient/Nurse Health Advisor   I discussed the limitations, risks, security and privacy concerns of performing an evaluation and management service by telephone and the availability of in person appointments. The patient expressed understanding and agreed to proceed.  Interactive audio and video telecommunications were attempted between this nurse and patient, however failed, due to patient having technical difficulties OR patient did not have access to video capability.  We continued and completed visit with audio only.  Some vital signs may be absent or patient reported.   Stacey Devita E Mozes Sagar, LPN   Review of Systems     Cardiac Risk Factors include: advanced age (>41mn, >>52women);diabetes mellitus;dyslipidemia;hypertension;sedentary lifestyle;obesity (BMI >30kg/m2)     Objective:    Today's Vitals   02/01/21 1136 02/01/21 1137  Weight: 214 lb (97.1 kg)   Height: 5' 2"  (1.575 m)   PainSc:  6    Body mass index is 39.14 kg/m.  Advanced Directives 02/01/2021 12/24/2020 05/27/2020 02/12/2020 01/31/2020 10/10/2019 01/21/2019  Does Patient Have a Medical Advance Directive? No No No No No No No  Would patient like information on creating a medical advance directive? Yes (MAU/Ambulatory/Procedural Areas - Information given) No - Patient declined No - Patient declined No - Patient declined No - Patient declined No - Patient declined No - Patient declined    Current Medications (verified) Outpatient Encounter Medications as of 02/01/2021  Medication Sig   albuterol (VENTOLIN HFA) 108 (90 Base)  MCG/ACT inhaler Inhale 2 puffs into the lungs every 6 (six) hours as needed for wheezing or shortness of breath.   ALPRAZolam (XANAX) 0.25 MG tablet alprazolam 0.25 mg tablet   amLODipine (NORVASC) 5 MG tablet amlodipine 5 mg tablet   atorvastatin (LIPITOR) 40 MG tablet Take 1 tablet (40 mg total) by mouth daily.   benazepril (LOTENSIN) 20 MG tablet Take 1 tablet (20 mg total) by mouth daily. NOTE CHANGE IN DOSE   Biotin 5000 MCG SUBL Place under the tongue.   blood glucose meter kit and supplies KIT Dispense based on patient and insurance preference. Use up to four times daily as directed. (FOR ICD-9 250.00, 250.01).   Blood Glucose Monitoring Suppl (LIBERTY BLOOD GLUCOSE MONITOR) DEVI 1 each by Does not apply route daily. Test 1x per day and prn-dx250.02   Cholecalciferol (VITAMIN D3) 5000 units CAPS Take 5,000 Units by mouth daily.   cyclobenzaprine (FLEXERIL) 5 MG tablet TAKE 1 TABLET BY MOUTH THREE TIMES DAILY AS NEEDED FOR MUSCLE SPASMS (NEEDS APPOINTMENT)   diltiazem (CARDIZEM CD) 120 MG 24 hr capsule Take 1 capsule by mouth once daily   diltiazem (TIAZAC) 120 MG 24 hr capsule diltiazem CD 120 mg capsule,extended release 24 hr   fluticasone (FLONASE) 50 MCG/ACT nasal spray USE 1 SPRAY(S) IN EACH NOSTRIL IN THE MORNING AND AT BEDTIME   furosemide (LASIX) 40 MG tablet TAKE 1 TABLET BY MOUTH IN THE MORNING AND 1/2 (ONE-HALF) TABLET AT 1 PM   glucose blood (ONETOUCH VERIO) test strip Check Bs up to 4 times daily Dx E11.9   glucose blood test strip Test 1x per day and prn- dx 250.02  lactulose (CHRONULAC) 10 GM/15ML solution Take 15 ml three times a day and as needed   LORazepam (ATIVAN) 0.5 MG tablet Take 0.5-1 tablets (0.25-0.5 mg total) by mouth 2 (two) times daily as needed for anxiety.   meclizine (ANTIVERT) 25 MG tablet Take 1 tablet (25 mg total) by mouth 3 (three) times daily as needed for dizziness.   metFORMIN (GLUCOPHAGE) 500 MG tablet Take 1 tablet (500 mg total) by mouth 2 (two)  times daily with a meal.   methylPREDNISolone (MEDROL DOSEPAK) 4 MG TBPK tablet Use as directed.   ondansetron (ZOFRAN) 4 MG tablet Take 1 tablet (4 mg total) by mouth every 8 (eight) hours as needed for nausea or vomiting. (Patient not taking: Reported on 8/45/3646)   ONETOUCH DELICA LANCETS 80H MISC USE ONE LANCET TO CHECK GLUCOSE ONCE DAILY AND AS NEEDED   traMADol (ULTRAM) 50 MG tablet Take 1 tablet (50 mg total) by mouth every 12 (twelve) hours as needed for moderate pain.   XIFAXAN 550 MG TABS tablet Take 1 tablet by mouth twice daily   No facility-administered encounter medications on file as of 02/01/2021.    Allergies (verified) Latex   History: Past Medical History:  Diagnosis Date   Allergy    seasonal   Anxiety    Arthritis    hands, back    Cataract    surgery to remove   Chronic back pain    lower back    Cirrhosis of liver without ascites ([place])    recent dx 07/11/18   COPD (chronic obstructive pulmonary disease) (HCC)    no inhaler   Diabetes mellitus without complication (HCC)    DJD (degenerative joint disease)    Fibromyalgia    Genetic testing 03/08/2017   Ms. Morath underwent genetic counseling and testing for hereditary cancer syndromes on 02/21/2017. Her results were negative for mutations in all 83 genes analyzed by [facility]'s 83-gene Multi-Cancers Panel. 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   History of abnormal mammogram    Hyperlipidemia    Hypertension    Irregular heart beat    Neuromuscular disorder (HCC)    fibromyalgia   Sleep apnea    cpap some   SVD (spontaneous vaginal delivery)    x 2   Tubular adenoma of colon 05/09/12   "innumerable number" of flat polyps   Past Surgical History:  Procedure  Laterality Date   CARPAL TUNNEL RELEASE Left 10/20/14   [facility]   CATARACT EXTRACTION, BILATERAL     CESAREAN SECTION  1977   CHOLECYSTECTOMY  1990   COLONOSCOPY     IR RADIOLOGIST EVAL & MGMT  07/16/2019   KNEE ARTHROSCOPY WITH SUBCHONDROPLASTY Right 03/27/2018   Procedure: Right knee arthroscopic partial medial meniscus repair versus menisectomy with medial tibia subchondroplasty;  Surgeon: Nicholes Stairs, MD;  Location: [facility];  Service: Orthopedics;  Laterality: Right;  120 mins   LARYNX SURGERY     POLYP REMOVAL   MULTIPLE TOOTH EXTRACTIONS     POLYPECTOMY     vocal cord   TONSILLECTOMY  1965   TUBAL LIGATION     TUMOR REMOVAL     benign fatty tumors on abdomen   UPPER GASTROINTESTINAL ENDOSCOPY     Family History  Problem Relation Age of Onset   Prostate cancer Father        d.89 diagnosed in late-60s   Colon cancer Father        diagnosed at 22   Heart failure Mother        d.37   Heart disease Mother    Colon polyps Brother        polyposis. Underwent partial colectomy at age 23.   Spina bifida Brother        died at birth   Stomach cancer Paternal Aunt        questionable   Kidney cancer Cousin        d.57s maternal first-cousin   Other Other 68       multiple lipomas   Colon cancer Paternal Uncle        in his 87s   Heart failure Maternal Grandmother        d.45   Heart failure Maternal Uncle        d.50   Colon polyps Son 36   Heart attack Son 64   Leukemia Maternal Aunt    Esophageal cancer Neg Hx    Rectal cancer Neg Hx    Social History   Socioeconomic History   Marital status: Married    Spouse name: [name] "goes by [employer]"   Number of children: 3   Years of education: 11   Highest education level: GED or equivalent  Occupational History   Occupation: retired  Tobacco Use   Smoking status: Former    Packs/day: 1.00    Years: 3.00    Pack years: 3.00    Types: Cigarettes    Quit date: 08/15/1969    Years since quitting: 51.5    Smokeless tobacco: Never  Vaping Use   Vaping Use: Never used  Substance and Sexual Activity   Alcohol use: No    Alcohol/week: 0.0 standard drinks   Drug use: No   Sexual activity: Not Currently  Other Topics Concern   Not on file  Social History Narrative   Not on file   Social Determinants of Health   Financial Resource Strain: Low Risk    Difficulty of Paying Living Expenses: Not very hard  Food Insecurity: No Food Insecurity   Worried About Charity fundraiser in the Last Year: Never true   Ran Out of Food in the Last Year: Never true  Transportation Needs: No Transportation Needs   Lack of Transportation (Medical): No   Lack of Transportation (Non-Medical): No  Physical Activity: Inactive   Days of Exercise per Week: 0 days   Minutes of Exercise per Session: 0 min  Stress: No Stress Concern Present   Feeling of Stress : Only a little  Social Connections: Engineer, building services of Communication with Friends and Family: More than three times a week   Frequency of Social Gatherings with Friends and Family: More than three times a week   Attends Religious Services: More than 4 times per year   Active Member of [employer] or Organizations: Yes   Attends Music therapist: More than 4 times per year   Marital Status: Married    Tobacco Counseling Counseling given: Not Answered   Clinical Intake:  Pre-visit preparation completed: Yes  Pain : 0-10 Pain Score: 6  Pain Type: Chronic pain Pain Location: Back Pain Descriptors / Indicators: Aching Pain Onset: More than a month ago Pain Frequency: Constant     BMI -  recorded: 39.14 Nutritional Status: BMI > 30  Obese Nutritional Risks: None Diabetes: Yes CBG done?: No Did pt. bring in CBG monitor from home?: No  How often do you need to have someone help you when you read instructions, pamphlets, or other written materials from your doctor or pharmacy?: 1 - Never  Nutrition Risk  Assessment:  Has the patient had any N/V/D within the last 2 months?  No  Does the patient have any non-healing wounds?  No  Has the patient had any unintentional weight loss or weight gain?  No   Diabetes:  Is the patient diabetic?  Yes  If diabetic, was a CBG obtained today?  No  Did the patient bring in their glucometer from home?  No  How often do you monitor your CBG's? Once daily fasting.   Financial Strains and Diabetes Management:  Are you having any financial strains with the device, your supplies or your medication? No .  Does the patient want to be seen by Chronic Care Management for management of their diabetes?  No  Would the patient like to be referred to a Nutritionist or for Diabetic Management?  No   Diabetic Exams:  Diabetic Eye Exam: Completed 12/2020.   Diabetic Foot Exam: Completed 03/18/2020. Pt has been advised about the importance in completing this exam. Pt is scheduled for diabetic foot exam on 04/05/2021.    Interpreter Needed?: No  Information entered by :: Daveda Larock, LPN   Activities of Daily Living In your present state of health, do you have any difficulty performing the following activities: 02/01/2021  Hearing? Y  Vision? N  Difficulty concentrating or making decisions? N  Walking or climbing stairs? Y  Dressing or bathing? N  Doing errands, shopping? N  Preparing Food and eating ? N  Using the Toilet? N  In the past six months, have you accidently leaked urine? N  Do you have problems with loss of bowel control? Y  Comment sometimes doesn't quite make it to the restroom due to meds she takes for her liver.  Managing your Medications? N  Managing your Finances? N  Housekeeping or managing your Housekeeping? N  Some recent data might be hidden    Patient Care Team: Janora Norlander, DO as PCP - General (Family Medicine) Croitoru, Dani Gobble, MD as PCP - Cardiology (Cardiology) Noralee Space, MD as Consulting Physician (Pulmonary  Disease) Mauri Pole, MD as Consulting Physician (Gastroenterology) Ilean China, RN as Registered Nurse Derek Jack, MD as Consulting Physician (Hematology) Celestia Khat, OD (Optometry)  Indicate any recent Medical Services you may have received from other than [facility] providers in the past year (date may be approximate).     Assessment:   This is a routine wellness examination for Megann.  Hearing/Vision screen Hearing Screening - Comments:: Wears hearing aids. C/o moderate hearing loss Vision Screening - Comments:: Wears eyeglasses - up to date with annual eye exam with [facility]  in [place]  Dietary issues and exercise activities discussed: Current Exercise Habits: The patient does not participate in regular exercise at present, Exercise limited by: orthopedic condition(s)   Goals Addressed               This Visit's Progress     Patient Stated (pt-stated)   Not on track     Pt states " I would like to get to walking better"        Depression Screen [facility] 2/9 Scores 02/01/2021 02/01/2021 12/09/2020 07/20/2020 03/18/2020  01/31/2020 10/25/2019  PHQ - 2 Score 0 0 0 0 0 0 0  PHQ- 9 Score - - - - - - 3    Fall Risk Fall Risk  02/01/2021 12/09/2020 08/04/2020 07/20/2020 03/18/2020  Falls in the past year? 1 1 1  0 0  Number falls in past yr: 0 0 0 - -  Injury with Fall? 1 1 1  - -  Risk for fall due to : Impaired balance/gait;Impaired vision;Medication side effect;Orthopedic patient;History of fall(s) Impaired balance/gait - - -  Follow up Education provided;Falls prevention discussed Falls evaluation completed - - -    FALL RISK PREVENTION PERTAINING TO THE HOME:  Any stairs in or around the home? Yes  If so, are there any without handrails? Yes  but she has and uses a ramp Home free of loose throw rugs in walkways, pet beds, electrical cords, etc? Yes  Adequate lighting in your home to reduce risk of falls? Yes   ASSISTIVE DEVICES UTILIZED TO PREVENT  FALLS:  Life alert? No  Use of a cane, walker or w/c? Yes  Grab bars in the bathroom? Yes  Shower chair or bench in shower? Yes  Elevated toilet seat or a handicapped toilet? Yes   TIMED UP AND GO:  Was the test performed? No . Telephonic visit.  Cognitive Function: Normal cognitive status assessed by direct observation by this Nurse Health Advisor. No abnormalities found.   MMSE - Mini Mental State Exam 06/26/2017  Orientation to time 5  Orientation to Place 5  Registration 3  Attention/ Calculation 5  Recall 2  Language- name 2 objects 2  Language- repeat 1  Language- follow 3 step command 3  Language- read & follow direction 1  Write a sentence 1  Copy design 1  Total score 29     6CIT Screen 02/01/2021 01/31/2020 01/21/2019  What Year? 0 points 0 points 0 points  What month? 0 points 0 points 0 points  What time? 0 points 0 points 0 points  Count back from 20 0 points 2 points 0 points  Months in reverse 0 points 0 points 0 points  Repeat phrase 4 points 2 points 0 points  Total Score 4 4 0    Immunizations Immunization History  Administered Date(s) Administered   Hepb-cpg 07/19/2018, 08/20/2018   Influenza, High Dose Seasonal PF 08/03/2018   Influenza,inj,Quad PF,6+ Mos 05/28/2014, 06/16/2015, 06/10/2016, 08/05/2020   Influenza,inj,quad, With Preservative 05/15/2017, 08/03/2018   Influenza-Unspecified 04/24/2017   Pneumococcal Conjugate-13 07/21/2014   Pneumococcal Polysaccharide-23 09/21/2015   Tdap 07/07/2009   Zoster Recombinat (Shingrix) 04/24/2017, 04/29/2017, 07/11/2017, 07/12/2017    TDAP status: Due, Education has been provided regarding the importance of this vaccine. Advised may receive this vaccine at local pharmacy or Health Dept. Aware to provide a copy of the vaccination record if obtained from local pharmacy or Health Dept. Verbalized acceptance and understanding.  Flu Vaccine status: Up to date  Pneumococcal vaccine status: Up to  date  Covid-19 vaccine status: Declined, Education has been provided regarding the importance of this vaccine but patient still declined. Advised may receive this vaccine at local pharmacy or Health Dept.or vaccine clinic. Aware to provide a copy of the vaccination record if obtained from local pharmacy or Health Dept. Verbalized acceptance and understanding.  Qualifies for Shingles Vaccine? Yes   Zostavax completed Yes   Shingrix Completed?: Yes  Screening Tests Health Maintenance  Topic Date Due   DEXA SCAN  06/27/2019   TETANUS/TDAP  07/08/2019  OPHTHALMOLOGY EXAM  10/13/2020   INFLUENZA VACCINE  03/15/2021   FOOT EXAM  03/18/2021   HEMOGLOBIN A1C  06/10/2021   COLONOSCOPY (Pts 45-33yr Insurance coverage will need to be confirmed)  08/07/2021   MAMMOGRAM  12/09/2021   Hepatitis C Screening  Completed   PNA vac Low Risk Adult  Completed   Zoster Vaccines- Shingrix  Completed   HPV VACCINES  Aged Out   COVID-19 Vaccine  Discontinued    Health Maintenance  Health Maintenance Due  Topic Date Due   DEXA SCAN  06/27/2019   TETANUS/TDAP  07/08/2019   OPHTHALMOLOGY EXAM  10/13/2020    Colorectal cancer screening: Type of screening: Colonoscopy. Completed 09/07/2017. Repeat every 3 years  Mammogram status: Completed 12/09/2020. Repeat every year  Bone Density status: Completed 08/07/2018. Results reflect: Bone density results: OSTEOPENIA. Repeat every 2 years.  Lung Cancer Screening: (Low Dose CT Chest recommended if Age 73-80years, 30 pack-year currently smoking OR have quit w/in 15years.) does not qualify.   Additional Screening:  Hepatitis C Screening: does qualify; Completed 07/11/2018  Vision Screening: Recommended annual ophthalmology exams for early detection of glaucoma and other disorders of the eye. Is the patient up to date with their annual eye exam?  Yes  Who is the provider or what is the name of the office in which the patient attends annual eye exams?  [place] [facility] is not established with a provider, would they like to be referred to a provider to establish care? No .   Dental Screening: Recommended annual dental exams for proper oral hygiene  Community Resource Referral / Chronic Care Management: CRR required this visit?  No   CCM required this visit?  No      Plan:     I have personally reviewed and noted the following in the patient's chart:   Medical and social history Use of alcohol, tobacco or illicit drugs  Current medications and supplements including opioid prescriptions.  Functional ability and status Nutritional status Physical activity Advanced directives List of other physicians Hospitalizations, surgeries, and ER visits in previous 12 months Vitals Screenings to include cognitive, depression, and falls Referrals and appointments  In addition, I have reviewed and discussed with patient certain preventive protocols, quality metrics, and best practice recommendations. A written personalized care plan for preventive services as well as general preventive health recommendations were provided to patient.     ASandrea Hammond LPN   [id number]  Nurse Notes: None

## 2021-02-10 DIAGNOSIS — D696 Thrombocytopenia, unspecified: Secondary | ICD-10-CM | POA: Diagnosis not present

## 2021-02-10 DIAGNOSIS — E1169 Type 2 diabetes mellitus with other specified complication: Secondary | ICD-10-CM | POA: Diagnosis not present

## 2021-02-10 DIAGNOSIS — K7460 Unspecified cirrhosis of liver: Secondary | ICD-10-CM | POA: Diagnosis not present

## 2021-02-10 DIAGNOSIS — G8929 Other chronic pain: Secondary | ICD-10-CM | POA: Diagnosis not present

## 2021-02-13 ENCOUNTER — Other Ambulatory Visit: Payer: Self-pay | Admitting: Family Medicine

## 2021-02-13 DIAGNOSIS — G8929 Other chronic pain: Secondary | ICD-10-CM

## 2021-03-09 ENCOUNTER — Other Ambulatory Visit (INDEPENDENT_AMBULATORY_CARE_PROVIDER_SITE_OTHER): Payer: PPO

## 2021-03-09 ENCOUNTER — Encounter: Payer: Self-pay | Admitting: Gastroenterology

## 2021-03-09 ENCOUNTER — Ambulatory Visit (INDEPENDENT_AMBULATORY_CARE_PROVIDER_SITE_OTHER): Payer: PPO | Admitting: Gastroenterology

## 2021-03-09 VITALS — BP 112/60 | HR 60 | Ht 60.75 in | Wt 224.5 lb

## 2021-03-09 DIAGNOSIS — K7460 Unspecified cirrhosis of liver: Secondary | ICD-10-CM

## 2021-03-09 DIAGNOSIS — K7581 Nonalcoholic steatohepatitis (NASH): Secondary | ICD-10-CM

## 2021-03-09 DIAGNOSIS — K7682 Hepatic encephalopathy: Secondary | ICD-10-CM

## 2021-03-09 DIAGNOSIS — K7290 Hepatic failure, unspecified without coma: Secondary | ICD-10-CM

## 2021-03-09 LAB — CBC WITH DIFFERENTIAL/PLATELET
Basophils Absolute: 0 10*3/uL (ref 0.0–0.1)
Basophils Relative: 0.5 % (ref 0.0–3.0)
Eosinophils Absolute: 0.2 10*3/uL (ref 0.0–0.7)
Eosinophils Relative: 3.6 % (ref 0.0–5.0)
HCT: 40.1 % (ref 36.0–46.0)
Hemoglobin: 13.6 g/dL (ref 12.0–15.0)
Lymphocytes Relative: 33.4 % (ref 12.0–46.0)
Lymphs Abs: 2 10*3/uL (ref 0.7–4.0)
MCHC: 34 g/dL (ref 30.0–36.0)
MCV: 96.9 fl (ref 78.0–100.0)
Monocytes Absolute: 0.7 10*3/uL (ref 0.1–1.0)
Monocytes Relative: 11 % (ref 3.0–12.0)
Neutro Abs: 3.1 10*3/uL (ref 1.4–7.7)
Neutrophils Relative %: 51.5 % (ref 43.0–77.0)
Platelets: 80 10*3/uL — ABNORMAL LOW (ref 150.0–400.0)
RBC: 4.14 Mil/uL (ref 3.87–5.11)
RDW: 13.8 % (ref 11.5–15.5)
WBC: 6.1 10*3/uL (ref 4.0–10.5)

## 2021-03-09 LAB — COMPREHENSIVE METABOLIC PANEL
ALT: 28 U/L (ref 0–35)
AST: 47 U/L — ABNORMAL HIGH (ref 0–37)
Albumin: 3.2 g/dL — ABNORMAL LOW (ref 3.5–5.2)
Alkaline Phosphatase: 105 U/L (ref 39–117)
BUN: 20 mg/dL (ref 6–23)
CO2: 32 mEq/L (ref 19–32)
Calcium: 9.3 mg/dL (ref 8.4–10.5)
Chloride: 103 mEq/L (ref 96–112)
Creatinine, Ser: 0.94 mg/dL (ref 0.40–1.20)
GFR: 60.43 mL/min (ref >60.00)
Glucose, Bld: 140 mg/dL — ABNORMAL HIGH (ref 70–99)
Potassium: 3.9 mEq/L (ref 3.5–5.1)
Sodium: 143 mEq/L (ref 135–145)
Total Bilirubin: 2.7 mg/dL — ABNORMAL HIGH (ref 0.2–1.2)
Total Protein: 5.9 g/dL — ABNORMAL LOW (ref 6.0–8.3)

## 2021-03-09 LAB — AMMONIA: Ammonia: 42 umol/L — ABNORMAL HIGH (ref 11–35)

## 2021-03-09 LAB — PROTIME-INR
INR: 1.2 ratio — ABNORMAL HIGH (ref 0.8–1.0)
Prothrombin Time: 13.6 s — ABNORMAL HIGH (ref 9.6–13.1)

## 2021-03-09 NOTE — Progress Notes (Signed)
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   Overall she feels much better but there are days when she has decreased energy. She continues to have intermittent episodes of confusion, mood changes and changes in sleep pattern.  She tends to sleep during daytime as she has difficulty falling asleep at night.  According to patient she sleeps during the day because she is bored and there is nothing else to do  Denies any worsening confusion, abdominal distention, melena or rectal bleeding. No vomiting, abdominal bloating or weight gain.    EGD 08/20/19: - Grade I and small (< 5 mm) esophageal varices. - No gross lesions in esophagus. - Portal hypertensive gastropathy. - Normal examined duodenum.   EGD 08/07/2018: - The Z-line was regular and was found 36 cm from the incisors. - No gross lesions were noted in the entire esophagus. No esophageal varices. - The stomach was normal. No gastric varices. - The examined duodenum was normal.   Colonoscopy 08/07/2018 - Four 4 to 9 mm polyps in the transverse colon and in the ascending colon, removed with a cold snare. Resected and retrieved. - Diverticulosis in the sigmoid colon. - Non-bleeding internal hemorrhoids.   Relevant history: History of COVID-19 infection November 2020.  Worsening mental status with confusion and somnolence post Covid.   She has prominent splenorenal shunt, was referred to IR to discuss possible embolization.  But given she has significant improvement of symptoms with Xifaxan twice daily, family and patient preferred to hold off the  embolization.     Outpatient Encounter Medications as of 03/09/2021  Medication Sig   albuterol (VENTOLIN HFA) 108 (90 Base) MCG/ACT inhaler Inhale 2 puffs into the lungs every 6 (six) hours as needed for wheezing or shortness of breath.   ALPRAZolam (XANAX) 0.25 MG tablet alprazolam 0.25 mg tablet   amLODipine (NORVASC) 5 MG tablet amlodipine 5 mg tablet   atorvastatin (LIPITOR) 40 MG tablet Take 1 tablet (40 mg total) by mouth daily.   benazepril (LOTENSIN) 20 MG tablet Take 1 tablet (20 mg total) by mouth daily. NOTE CHANGE IN DOSE   Biotin 5000 MCG SUBL Place under the tongue.   blood glucose meter kit and supplies KIT Dispense based on patient and insurance preference. Use up to four times daily as directed. (FOR ICD-9 250.00, 250.01).   Blood Glucose Monitoring Suppl (LIBERTY BLOOD GLUCOSE MONITOR) DEVI 1 each by Does not apply route daily. Test 1x per day and prn-dx250.02   Cholecalciferol (VITAMIN D3) 5000 units CAPS Take 5,000 Units by mouth daily.   cyclobenzaprine (FLEXERIL) 5 MG tablet TAKE 1 TABLET BY MOUTH THREE TIMES DAILY AS NEEDED FOR MUSCLE SPASMS (NEEDS APPOINTMENT)   diltiazem (CARDIZEM CD) 120 MG 24 hr capsule Take 1 capsule by mouth once daily   diltiazem (TIAZAC) 120 MG 24 hr capsule diltiazem CD 120 mg capsule,extended release 24 hr   fluticasone (FLONASE) 50 MCG/ACT nasal spray USE 1 SPRAY(S) IN EACH NOSTRIL IN THE MORNING AND AT BEDTIME   furosemide (LASIX) 40 MG tablet TAKE 1 TABLET BY MOUTH IN THE MORNING AND 1/2 (ONE-HALF) TABLET  AT 1 PM   glucose blood (ONETOUCH VERIO) test strip Check Bs up to 4 times daily Dx E11.9   glucose blood test strip Test 1x per day and prn- dx 250.02   lactulose (CHRONULAC) 10 GM/15ML solution Take 15 ml three times a day and as needed   LORazepam (ATIVAN) 0.5 MG tablet Take 0.5-1 tablets (0.25-0.5 mg total) by mouth 2 (two) times daily as needed for anxiety.   meclizine (ANTIVERT) 25 MG tablet Take 1 tablet (25 mg total) by mouth 3  (three) times daily as needed for dizziness.   metFORMIN (GLUCOPHAGE) 500 MG tablet Take 1 tablet (500 mg total) by mouth 2 (two) times daily with a meal.   ondansetron (ZOFRAN) 4 MG tablet Take 1 tablet (4 mg total) by mouth every 8 (eight) hours as needed for nausea or vomiting.   ONETOUCH DELICA LANCETS 58K MISC USE ONE LANCET TO CHECK GLUCOSE ONCE DAILY AND AS NEEDED   traMADol (ULTRAM) 50 MG tablet Take 1 tablet (50 mg total) by mouth every 12 (twelve) hours as needed for moderate pain.   XIFAXAN 550 MG TABS tablet Take 1 tablet by mouth twice daily   [DISCONTINUED] methylPREDNISolone (MEDROL DOSEPAK) 4 MG TBPK tablet Use as directed.   No facility-administered encounter medications on file as of 03/09/2021.    Allergies as of 03/09/2021 - Review Complete 03/09/2021  Allergen Reaction Noted   Latex  03/27/2018    Past Medical History:  Diagnosis Date   Allergy    seasonal   Anxiety    Arthritis    hands, back    Cataract    surgery to remove   Chronic back pain    lower back    Cirrhosis of liver without ascites ([place])    recent dx 07/11/18   COPD (chronic obstructive pulmonary disease) (HCC)    no inhaler   Diabetes mellitus without complication (HCC)    DJD (degenerative joint disease)    Fibromyalgia    Genetic testing 03/08/2017   Ms. Petrosky underwent genetic counseling and testing for hereditary cancer syndromes on 02/21/2017. Her results were negative for mutations in all 83 genes analyzed by [facility]'s 83-gene Multi-Cancers Panel. 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   History of abnormal mammogram    Hyperlipidemia    Hypertension    Irregular heart beat    Neuromuscular disorder (HCC)    fibromyalgia   Sleep apnea     cpap some   SVD (spontaneous vaginal delivery)    x 2   Tubular adenoma of colon 05/09/12   "innumerable number" of flat polyps    Past Surgical History:  Procedure Laterality Date   CARPAL TUNNEL RELEASE Left 10/20/14   [facility]   CATARACT EXTRACTION, BILATERAL     CESAREAN SECTION  1977   CHOLECYSTECTOMY  1990   COLONOSCOPY     IR RADIOLOGIST EVAL & MGMT  07/16/2019   KNEE ARTHROSCOPY WITH SUBCHONDROPLASTY Right 03/27/2018   Procedure: Right knee arthroscopic partial medial meniscus repair versus menisectomy with medial tibia subchondroplasty;  Surgeon: Nicholes Stairs, MD;  Location: [facility];  Service: Orthopedics;  Laterality: Right;  120 mins   LARYNX SURGERY     POLYP REMOVAL   MULTIPLE TOOTH EXTRACTIONS  POLYPECTOMY     vocal cord   TONSILLECTOMY  1965   TUBAL LIGATION     TUMOR REMOVAL     benign fatty tumors on abdomen   UPPER GASTROINTESTINAL ENDOSCOPY      Family History  Problem Relation Age of Onset   Prostate cancer Father        d.89 diagnosed in late-60s   Colon cancer Father        diagnosed at 105   Heart failure Mother        d.37   Heart disease Mother    Colon polyps Brother        polyposis. Underwent partial colectomy at age 10.   Spina bifida Brother        died at birth   Stomach cancer Paternal Aunt        questionable   Kidney cancer Cousin        d.31s maternal first-cousin   Other Other 35       multiple lipomas   Colon cancer Paternal Uncle        in his 48s   Heart failure Maternal Grandmother        d.45   Heart failure Maternal Uncle        d.50   Colon polyps Son 20   Heart attack Son 68   Leukemia Maternal Aunt    Esophageal cancer Neg Hx    Rectal cancer Neg Hx     Social History   Socioeconomic History   Marital status: Married    Spouse name: [name] "goes by [employer]"   Number of children: 3   Years of education: 11   Highest education level: GED or equivalent  Occupational History   Occupation: retired   Tobacco Use   Smoking status: Former    Packs/day: 1.00    Years: 3.00    Pack years: 3.00    Types: Cigarettes    Quit date: 08/15/1969    Years since quitting: 51.6   Smokeless tobacco: Never  Vaping Use   Vaping Use: Never used  Substance and Sexual Activity   Alcohol use: No    Alcohol/week: 0.0 standard drinks   Drug use: No   Sexual activity: Not Currently  Other Topics Concern   Not on file  Social History Narrative   Not on file   Social Determinants of Health   Financial Resource Strain: Low Risk    Difficulty of Paying Living Expenses: Not very hard  Food Insecurity: No Food Insecurity   Worried About Charity fundraiser in the Last Year: Never true   Ran Out of Food in the Last Year: Never true  Transportation Needs: No Transportation Needs   Lack of Transportation (Medical): No   Lack of Transportation (Non-Medical): No  Physical Activity: Inactive   Days of Exercise per Week: 0 days   Minutes of Exercise per Session: 0 min  Stress: No Stress Concern Present   Feeling of Stress : Only a little  Social Connections: Engineer, building services of Communication with Friends and Family: More than three times a week   Frequency of Social Gatherings with Friends and Family: More than three times a week   Attends Religious Services: More than 4 times per year   Active Member of [employer] or Organizations: Yes   Attends Music therapist: More than 4 times per year   Marital Status: Married  Human resources officer Violence: Not At Risk  Fear of Current or Ex-Partner: No   Emotionally Abused: No   Physically Abused: No   Sexually Abused: No      Review of systems: All other review of systems negative except as mentioned in the HPI.   Physical Exam: Vitals:   03/09/21 0826  BP: 112/60  Pulse: 60   Body mass index is 42.77 kg/m. Gen:      No acute distress HEENT:  sclera anicteric Abd:      soft, non-tender; no palpable masses, no distension,  no ascites Ext: Bilateral 1+ edema Neuro: alert and oriented x 3, no asterixis Psych: normal mood and affect  Data Reviewed:  Reviewed labs, radiology imaging, old records and pertinent past GI work up    Assessment and Plan/Recommendations:  75 year old very pleasant with history of Stacey Summers cirrhosis decompensated with hepatic encephalopathy   Overall clinical status is stable Meld score remains low, 12   Follow-up CBC, CMP & PT/ INR and ammonia  Chronic fatigue secondary to comorbid conditions including cirrhosis Vitamin D and B12 level within normal range   Hepatocellular screening: Up-to-date, no hepatic lesions on ultrasound, AFP within normal range Recall October 2022 for repeat AFP and abdominal ultrasound   Chronic thrombocytopenia: Secondary to portal hypertension in the setting of [place] cirrhosis   Volume status: Stable Currently on low-dose Lasix for peripheral edema   Hepatic encephalopathy: Continue Xifaxan twice daily and lactulose as needed with goal 3 soft bowel movements daily   Small esophageal varices on EGD in January 2021 Due for surveillance January 2023  History of advanced adenomatous colon polyps, due for surveillance colonoscopy in December 2022   Continue with low-carb and low-fat diet, avoid artificial sweeteners, high fructose corn syrup. Daily exercise as tolerated    Return in 3 to 4 months or sooner if needed  This visit required >40 minutes of patient care (this includes precharting, chart review, review of results, face-to-face time used for counseling as well as treatment plan and follow-up. The patient was provided an opportunity to ask questions and all were answered. The patient agreed with the plan and demonstrated an understanding of the instructions.  Damaris Hippo , MD    CC: Janora Norlander, DO

## 2021-03-09 NOTE — Patient Instructions (Signed)
Your provider has requested that you go to the basement level for lab work before leaving today. Press "B" on the elevator. The lab is located at the first door on the left as you exit the elevator.   Your recall colonoscopy/Endoscopy will be in 07/2021, We will mail you out a reminder letter. This will be scheduled at [facility]  Follow up in 3 months  Due to recent changes in healthcare laws, you may see the results of your imaging and laboratory studies on MyChart before your provider has had a chance to review them.  We understand that in some cases there may be results that are confusing or concerning to you. Not all laboratory results come back in the same time frame and the provider may be waiting for multiple results in order to interpret others.  Please give [place] 48 hours in order for your provider to thoroughly review all the results before contacting the office for clarification of your results.    If you are age 76 or older, your body mass index should be between 23-30. Your Body mass index is 42.77 kg/m. If this is out of the aforementioned range listed, please consider follow up with your Primary Care Provider.  If you are age 40 or younger, your body mass index should be between 19-25. Your Body mass index is 42.77 kg/m. If this is out of the aformentioned range listed, please consider follow up with your Primary Care Provider.   __________________________________________________________  The [facility] providers would like to encourage you to use [facility] to communicate with providers for non-urgent requests or questions.  Due to long hold times on the telephone, sending your provider a message by [facility] may be a faster and more efficient way to get a response.  Please allow 48 business hours for a response.  Please remember that this is for non-urgent requests.    I appreciate the  opportunity to care for you  Thank You   Harl Bowie , MD

## 2021-03-13 ENCOUNTER — Other Ambulatory Visit: Payer: Self-pay | Admitting: Gastroenterology

## 2021-03-17 ENCOUNTER — Other Ambulatory Visit: Payer: Self-pay | Admitting: Gastroenterology

## 2021-03-26 ENCOUNTER — Other Ambulatory Visit: Payer: Self-pay

## 2021-03-26 ENCOUNTER — Inpatient Hospital Stay (HOSPITAL_COMMUNITY): Payer: PPO | Attending: Hematology

## 2021-03-26 DIAGNOSIS — D696 Thrombocytopenia, unspecified: Secondary | ICD-10-CM | POA: Insufficient documentation

## 2021-03-26 DIAGNOSIS — Z87891 Personal history of nicotine dependence: Secondary | ICD-10-CM | POA: Insufficient documentation

## 2021-03-26 DIAGNOSIS — M1990 Unspecified osteoarthritis, unspecified site: Secondary | ICD-10-CM | POA: Diagnosis not present

## 2021-03-26 DIAGNOSIS — Z7984 Long term (current) use of oral hypoglycemic drugs: Secondary | ICD-10-CM | POA: Insufficient documentation

## 2021-03-26 DIAGNOSIS — M129 Arthropathy, unspecified: Secondary | ICD-10-CM | POA: Diagnosis not present

## 2021-03-26 DIAGNOSIS — E785 Hyperlipidemia, unspecified: Secondary | ICD-10-CM | POA: Insufficient documentation

## 2021-03-26 DIAGNOSIS — R5383 Other fatigue: Secondary | ICD-10-CM | POA: Diagnosis not present

## 2021-03-26 DIAGNOSIS — M791 Myalgia, unspecified site: Secondary | ICD-10-CM | POA: Insufficient documentation

## 2021-03-26 DIAGNOSIS — Z79899 Other long term (current) drug therapy: Secondary | ICD-10-CM | POA: Diagnosis not present

## 2021-03-26 DIAGNOSIS — I10 Essential (primary) hypertension: Secondary | ICD-10-CM | POA: Diagnosis not present

## 2021-03-26 DIAGNOSIS — Z800 Family history of malignant neoplasm of digestive organs: Secondary | ICD-10-CM | POA: Insufficient documentation

## 2021-03-26 DIAGNOSIS — K7460 Unspecified cirrhosis of liver: Secondary | ICD-10-CM | POA: Diagnosis not present

## 2021-03-26 DIAGNOSIS — M797 Fibromyalgia: Secondary | ICD-10-CM | POA: Insufficient documentation

## 2021-03-26 DIAGNOSIS — E119 Type 2 diabetes mellitus without complications: Secondary | ICD-10-CM | POA: Insufficient documentation

## 2021-03-26 DIAGNOSIS — E538 Deficiency of other specified B group vitamins: Secondary | ICD-10-CM | POA: Insufficient documentation

## 2021-03-26 DIAGNOSIS — Z86010 Personal history of colonic polyps: Secondary | ICD-10-CM | POA: Diagnosis not present

## 2021-03-26 DIAGNOSIS — G4730 Sleep apnea, unspecified: Secondary | ICD-10-CM | POA: Diagnosis not present

## 2021-03-26 DIAGNOSIS — J449 Chronic obstructive pulmonary disease, unspecified: Secondary | ICD-10-CM | POA: Insufficient documentation

## 2021-03-26 DIAGNOSIS — K219 Gastro-esophageal reflux disease without esophagitis: Secondary | ICD-10-CM | POA: Diagnosis not present

## 2021-03-26 DIAGNOSIS — Z806 Family history of leukemia: Secondary | ICD-10-CM | POA: Insufficient documentation

## 2021-03-26 LAB — CBC WITH DIFFERENTIAL/PLATELET
Abs Immature Granulocytes: 0.02 10*3/uL (ref 0.00–0.07)
Basophils Absolute: 0 10*3/uL (ref 0.0–0.1)
Basophils Relative: 0 %
Eosinophils Absolute: 0.2 10*3/uL (ref 0.0–0.5)
Eosinophils Relative: 4 %
HCT: 37.5 % (ref 36.0–46.0)
Hemoglobin: 12.3 g/dL (ref 12.0–15.0)
Immature Granulocytes: 1 %
Lymphocytes Relative: 31 %
Lymphs Abs: 1.3 10*3/uL (ref 0.7–4.0)
MCH: 33.2 pg (ref 26.0–34.0)
MCHC: 32.8 g/dL (ref 30.0–36.0)
MCV: 101.4 fL — ABNORMAL HIGH (ref 80.0–100.0)
Monocytes Absolute: 0.4 10*3/uL (ref 0.1–1.0)
Monocytes Relative: 8 %
Neutro Abs: 2.5 10*3/uL (ref 1.7–7.7)
Neutrophils Relative %: 56 %
Platelets: 65 10*3/uL — ABNORMAL LOW (ref 150–400)
RBC: 3.7 MIL/uL — ABNORMAL LOW (ref 3.87–5.11)
RDW: 13.3 % (ref 11.5–15.5)
WBC: 4.4 10*3/uL (ref 4.0–10.5)
nRBC: 0 % (ref 0.0–0.2)

## 2021-03-26 LAB — VITAMIN B12: Vitamin B-12: 336 pg/mL (ref 180–914)

## 2021-03-26 LAB — COMPREHENSIVE METABOLIC PANEL
ALT: 34 U/L (ref 0–44)
AST: 58 U/L — ABNORMAL HIGH (ref 15–41)
Albumin: 2.8 g/dL — ABNORMAL LOW (ref 3.5–5.0)
Alkaline Phosphatase: 125 U/L (ref 38–126)
Anion gap: 6 (ref 5–15)
BUN: 20 mg/dL (ref 8–23)
CO2: 27 mmol/L (ref 22–32)
Calcium: 8.1 mg/dL — ABNORMAL LOW (ref 8.9–10.3)
Chloride: 104 mmol/L (ref 98–111)
Creatinine, Ser: 0.79 mg/dL (ref 0.44–1.00)
GFR, Estimated: >60 mL/min (ref >60)
Glucose, Bld: 322 mg/dL — ABNORMAL HIGH (ref 70–99)
Potassium: 3.9 mmol/L (ref 3.5–5.1)
Sodium: 137 mmol/L (ref 135–145)
Total Bilirubin: 2.8 mg/dL — ABNORMAL HIGH (ref 0.3–1.2)
Total Protein: 5.4 g/dL — ABNORMAL LOW (ref 6.5–8.1)

## 2021-03-26 LAB — LACTATE DEHYDROGENASE: LDH: 211 U/L — ABNORMAL HIGH (ref 98–192)

## 2021-03-28 ENCOUNTER — Other Ambulatory Visit: Payer: Self-pay | Admitting: Family Medicine

## 2021-03-28 DIAGNOSIS — R600 Localized edema: Secondary | ICD-10-CM

## 2021-03-30 LAB — METHYLMALONIC ACID, SERUM: Methylmalonic Acid, Quantitative: 629 nmol/L — ABNORMAL HIGH (ref 0–378)

## 2021-03-30 NOTE — Progress Notes (Signed)
Stacey Summers, [place] [address]   CLINIC:  Medical Oncology/Hematology  PCP:  Janora Norlander, DO [place] [phone]   REASON FOR VISIT:  Follow-up for thrombocytopenia  PRIOR THERAPY: None  CURRENT THERAPY: Surveillance  INTERVAL HISTORY:  Stacey Summers 75 y.o. female returns for routine follow-up of thrombocytopenia she was last evaluated by Tarri Abernethy PA-C via telemedicine visit on 12/24/2020.  At today's visit, she reports feeling fair.  No recent hospitalizations, surgeries, or changes in baseline health status.  Her biggest concern today is chronic fatigue, reporting that she has had very little energy since she was diagnosed with cirrhosis several years ago.  She admits to easy bruising, but denies petechial rash.  No gross blood loss such as epistaxis, hematemesis, hematochezia, or melena.  She denies any B symptoms such as fever, chills, night sweats, unintentional weight loss.  She has 25% energy and 70% appetite.  She has gained about 15 pounds in the past several months, which may possibly be fluid weight, and she is encouraged to speak to her liver doctor and primary care provider about her unusual weight gain.   REVIEW OF SYSTEMS:  Review of Systems  Constitutional:  Positive for fatigue and unexpected weight change (gained 15 pounds). Negative for appetite change, chills, diaphoresis and fever.  HENT:   Negative for lump/mass and nosebleeds.   Eyes:  Negative for eye problems.  Respiratory:  Positive for shortness of breath (with exertion). Negative for cough and hemoptysis.   Cardiovascular:  Positive for leg swelling. Negative for chest pain and palpitations.  Gastrointestinal:  Positive for nausea. Negative for abdominal pain, blood in stool, constipation, diarrhea and vomiting.  Genitourinary:  Negative for hematuria.   Skin: Negative.   Neurological:  Positive for headaches and  numbness (chronic neuropathy). Negative for dizziness and light-headedness.  Hematological:  Does not bruise/bleed easily.     PAST MEDICAL/SURGICAL HISTORY:  Past Medical History:  Diagnosis Date   Allergy    seasonal   Anxiety    Arthritis    hands, back    Cataract    surgery to remove   Chronic back pain    lower back    Cirrhosis of liver without ascites ([place])    recent dx 07/11/18   COPD (chronic obstructive pulmonary disease) (HCC)    no inhaler   Diabetes mellitus without complication (HCC)    DJD (degenerative joint disease)    Fibromyalgia    Genetic testing 03/08/2017   Ms. Foos underwent genetic counseling and testing for hereditary cancer syndromes on 02/21/2017. Her results were negative for mutations in all 83 genes analyzed by [facility]'s 83-gene Multi-Cancers Panel. 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   History of abnormal mammogram    Hyperlipidemia    Hypertension    Irregular heart beat    Neuromuscular disorder (HCC)    fibromyalgia   Sleep apnea    cpap some   SVD (spontaneous vaginal delivery)    x 2   Tubular adenoma of colon 05/09/12   "innumerable number" of flat polyps   Past Surgical History:  Procedure Laterality Date   CARPAL TUNNEL RELEASE Left 10/20/14   [facility]  CATARACT EXTRACTION, BILATERAL     CESAREAN SECTION  1977   CHOLECYSTECTOMY  1990   COLONOSCOPY     IR RADIOLOGIST EVAL & MGMT  07/16/2019   KNEE ARTHROSCOPY WITH SUBCHONDROPLASTY Right 03/27/2018   Procedure: Right knee arthroscopic partial medial meniscus repair versus menisectomy with medial tibia subchondroplasty;  Surgeon: Nicholes Stairs, MD;  Location: [facility];  Service: Orthopedics;  Laterality: Right;  120 mins   LARYNX  SURGERY     POLYP REMOVAL   MULTIPLE TOOTH EXTRACTIONS     POLYPECTOMY     vocal cord   TONSILLECTOMY  1965   TUBAL LIGATION     TUMOR REMOVAL     benign fatty tumors on abdomen   UPPER GASTROINTESTINAL ENDOSCOPY       SOCIAL HISTORY:  Social History   Socioeconomic History   Marital status: Married    Spouse name: [name] "goes by [employer]"   Number of children: 3   Years of education: 11   Highest education level: GED or equivalent  Occupational History   Occupation: retired  Tobacco Use   Smoking status: Former    Packs/day: 1.00    Years: 3.00    Pack years: 3.00    Types: Cigarettes    Quit date: 08/15/1969    Years since quitting: 51.6   Smokeless tobacco: Never  Vaping Use   Vaping Use: Never used  Substance and Sexual Activity   Alcohol use: No    Alcohol/week: 0.0 standard drinks   Drug use: No   Sexual activity: Not Currently  Other Topics Concern   Not on file  Social History Narrative   Not on file   Social Determinants of Health   Financial Resource Strain: Low Risk    Difficulty of Paying Living Expenses: Not very hard  Food Insecurity: No Food Insecurity   Worried About Charity fundraiser in the Last Year: Never true   Ran Out of Food in the Last Year: Never true  Transportation Needs: No Transportation Needs   Lack of Transportation (Medical): No   Lack of Transportation (Non-Medical): No  Physical Activity: Inactive   Days of Exercise per Week: 0 days   Minutes of Exercise per Session: 0 min  Stress: No Stress Concern Present   Feeling of Stress : Only a little  Social Connections: Engineer, building services of Communication with Friends and Family: More than three times a week   Frequency of Social Gatherings with Friends and Family: More than three times a week   Attends Religious Services: More than 4 times per year   Active Member of [employer] or Organizations: Yes   Attends Music therapist: More than 4 times per year    Marital Status: Married  Human resources officer Violence: Not At Risk   Fear of Current or Ex-Partner: No   Emotionally Abused: No   Physically Abused: No   Sexually Abused: No    FAMILY HISTORY:  Family History  Problem Relation Age of Onset   Prostate cancer Father        d.89 diagnosed in late-60s   Colon cancer Father        diagnosed at 7   Heart failure Mother        d.37   Heart disease Mother    Colon polyps Brother        polyposis. Underwent partial colectomy at age 64.   Spina bifida Brother  died at birth   Stomach cancer Paternal Aunt        questionable   Kidney cancer Cousin        d.63s maternal first-cousin   Other Other 74       multiple lipomas   Colon cancer Paternal Uncle        in his 84s   Heart failure Maternal Grandmother        d.45   Heart failure Maternal Uncle        d.50   Colon polyps Son 8   Heart attack Son 66   Leukemia Maternal Aunt    Esophageal cancer Neg Hx    Rectal cancer Neg Hx     CURRENT MEDICATIONS:  Outpatient Encounter Medications as of 03/31/2021  Medication Sig Note   albuterol (VENTOLIN HFA) 108 (90 Base) MCG/ACT inhaler Inhale 2 puffs into the lungs every 6 (six) hours as needed for wheezing or shortness of breath.    ALPRAZolam (XANAX) 0.25 MG tablet alprazolam 0.25 mg tablet    amLODipine (NORVASC) 5 MG tablet amlodipine 5 mg tablet    atorvastatin (LIPITOR) 40 MG tablet Take 1 tablet (40 mg total) by mouth daily.    benazepril (LOTENSIN) 20 MG tablet Take 1 tablet (20 mg total) by mouth daily. NOTE CHANGE IN DOSE    Biotin 5000 MCG SUBL Place under the tongue.    blood glucose meter kit and supplies KIT Dispense based on patient and insurance preference. Use up to four times daily as directed. (FOR ICD-9 250.00, 250.01).    Blood Glucose Monitoring Suppl (LIBERTY BLOOD GLUCOSE MONITOR) DEVI 1 each by Does not apply route daily. Test 1x per day and prn-dx250.02    Cholecalciferol (VITAMIN D3) 5000 units CAPS Take  5,000 Units by mouth daily.    cyclobenzaprine (FLEXERIL) 5 MG tablet TAKE 1 TABLET BY MOUTH THREE TIMES DAILY AS NEEDED FOR MUSCLE SPASMS (NEEDS APPOINTMENT)    diltiazem (CARDIZEM CD) 120 MG 24 hr capsule Take 1 capsule by mouth once daily    diltiazem (TIAZAC) 120 MG 24 hr capsule diltiazem CD 120 mg capsule,extended release 24 hr    fluticasone (FLONASE) 50 MCG/ACT nasal spray USE 1 SPRAY(S) IN EACH NOSTRIL IN THE MORNING AND AT BEDTIME    furosemide (LASIX) 40 MG tablet TAKE 1 TABLET BY MOUTH ONCE DAILY IN THE MORNING AND 1/2 (ONE-HALF) ONCE DAILY AT 1 PM    glucose blood (ONETOUCH VERIO) test strip Check Bs up to 4 times daily Dx E11.9    glucose blood test strip Test 1x per day and prn- dx 250.02    lactulose (CHRONULAC) 10 GM/15ML solution TAKE 15 ML BY MOUTH THREE TIMES DAILY AS NEEDED    LORazepam (ATIVAN) 0.5 MG tablet Take 0.5-1 tablets (0.25-0.5 mg total) by mouth 2 (two) times daily as needed for anxiety.    meclizine (ANTIVERT) 25 MG tablet Take 1 tablet (25 mg total) by mouth 3 (three) times daily as needed for dizziness.    metFORMIN (GLUCOPHAGE) 500 MG tablet Take 1 tablet (500 mg total) by mouth 2 (two) times daily with a meal.    ondansetron (ZOFRAN) 4 MG tablet Take 1 tablet (4 mg total) by mouth every 8 (eight) hours as needed for nausea or vomiting. 03/09/2021: On hand   ONETOUCH DELICA LANCETS 73U MISC USE ONE LANCET TO CHECK GLUCOSE ONCE DAILY AND AS NEEDED    traMADol (ULTRAM) 50 MG tablet Take 1 tablet (50 mg total) by mouth  every 12 (twelve) hours as needed for moderate pain.    XIFAXAN 550 MG TABS tablet Take 1 tablet by mouth twice daily    No facility-administered encounter medications on file as of 03/31/2021.    ALLERGIES:  Allergies  Allergen Reactions   Latex     "my hands feels numb"     PHYSICAL EXAM:  ECOG PERFORMANCE STATUS: 2 - Symptomatic, <50% confined to bed  There were no vitals filed for this visit. There were no vitals filed for this  visit. Physical Exam Constitutional:      Appearance: Normal appearance. She is obese.  HENT:     Head: Normocephalic and atraumatic.     Mouth/Throat:     Mouth: Mucous membranes are moist.  Eyes:     Extraocular Movements: Extraocular movements intact.     Pupils: Pupils are equal, round, and reactive to light.  Cardiovascular:     Rate and Rhythm: Normal rate and regular rhythm.     Pulses: Normal pulses.     Heart sounds: Murmur heard.  Pulmonary:     Effort: Pulmonary effort is normal.     Breath sounds: Normal breath sounds.  Abdominal:     General: Bowel sounds are normal.     Palpations: Abdomen is soft.     Tenderness: There is no abdominal tenderness.  Musculoskeletal:        General: No swelling.     Right lower leg: Edema present.     Left lower leg: Edema present.  Lymphadenopathy:     Cervical: No cervical adenopathy.  Skin:    General: Skin is warm and dry.     Findings: Bruising present.  Neurological:     General: No focal deficit present.     Mental Status: She is alert and oriented to person, place, and time.  Psychiatric:        Mood and Affect: Mood normal.        Behavior: Behavior normal.     LABORATORY DATA:  I have reviewed the labs as listed.  CBC    Component Value Date/Time   WBC 4.4 03/26/2021 1301   RBC 3.70 (L) 03/26/2021 1301   HGB 12.3 03/26/2021 1301   HGB 13.1 12/09/2020 1331   HCT 37.5 03/26/2021 1301   HCT 38.5 12/09/2020 1331   PLT 65 (L) 03/26/2021 1301   PLT 72 (LL) 12/09/2020 1331   MCV 101.4 (H) 03/26/2021 1301   MCV 98 (H) 12/09/2020 1331   MCH 33.2 03/26/2021 1301   MCHC 32.8 03/26/2021 1301   RDW 13.3 03/26/2021 1301   RDW 13.0 12/09/2020 1331   LYMPHSABS 1.3 03/26/2021 1301   LYMPHSABS 2.1 12/09/2020 1331   MONOABS 0.4 03/26/2021 1301   EOSABS 0.2 03/26/2021 1301   EOSABS 0.2 12/09/2020 1331   BASOSABS 0.0 03/26/2021 1301   BASOSABS 0.0 12/09/2020 1331   CMP Latest Ref Rng & Units 03/26/2021 03/09/2021  12/17/2020  Glucose 70 - 99 mg/dL 322(H) 140(H) 180(H)  BUN 8 - 23 mg/dL 20 20 31(H)  Creatinine 0.44 - 1.00 mg/dL 0.79 0.94 0.94  Sodium 135 - 145 mmol/L 137 143 141  Potassium 3.5 - 5.1 mmol/L 3.9 3.9 4.5  Chloride 98 - 111 mmol/L 104 103 106  CO2 22 - 32 mmol/L 27 32 29  Calcium 8.9 - 10.3 mg/dL 8.1(L) 9.3 9.0  Total Protein 6.5 - 8.1 g/dL 5.4(L) 5.9(L) 5.8(L)  Total Bilirubin 0.3 - 1.2 mg/dL 2.8(H) 2.7(H) 2.4(H)  Alkaline Phos  38 - 126 U/L 125 105 133(H)  AST 15 - 41 U/L 58(H) 47(H) 63(H)  ALT 0 - 44 U/L 34 28 47(H)    DIAGNOSTIC IMAGING:  I have independently reviewed the relevant imaging and discussed with the patient.  ASSESSMENT & PLAN: 1.  Thrombocytopenia: - Platelet count is ranging anywhere between 70-140.  Most recent platelets (12/17/2020) at 100. - CT of the [facility] on 06/27/2018 showed spleen is mildly enlarged.  She has cirrhosis of the liver. 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  Admits to easy bruising but denies petechial rash.   - Most recent labs (03/26/2021): Platelets 65, LDH slightly elevated at 211, B12 normal at 336 but methylmalonic acid elevated at 629 - No treatment indicated unless platelet count is consistently < 30,000 -if this occurs, splenic artery embolization could be considered - PLAN: Elevated methylmalonic acid indicates B12 deficiency.  We will start patient on monthly B12 injections.  Otherwise, no treatment of thrombocytopenia needed at this time.  RTC for labs and follow-up visit in 4 months.  2.  Lung nodules: - CT of the chest from 10/04/2019 which showed resolution of recent COVID-19 pneumonia.  Bilateral pulmonary nodules, less than 4 mm in size have been stable compared to previous CT chest 06/28/2019 - She is a former smoker and has not smoked cigarettes since 1971. - PLAN: Perform CT chest before next appointment.  If unchanged, no  further work-up will be needed.  PLAN SUMMARY & DISPOSITION: - Monthly B12 injections - Labs in 48-month- CT chest in 4 months - RTC in 4 months, after labs and CT chest  All questions were answered. The patient knows to call the clinic with any problems, questions or concerns.  Medical decision making: Low  Time spent on visit: I spent 15 minutes counseling the patient face to face. The total time spent in the appointment was 25 minutes and more than 50% was on counseling.   RHarriett Rush PA-C  03/31/2021 4:13 PM

## 2021-03-31 ENCOUNTER — Encounter (HOSPITAL_COMMUNITY): Payer: Self-pay | Admitting: Physician Assistant

## 2021-03-31 ENCOUNTER — Inpatient Hospital Stay (HOSPITAL_COMMUNITY): Payer: PPO | Admitting: Physician Assistant

## 2021-03-31 ENCOUNTER — Other Ambulatory Visit: Payer: Self-pay

## 2021-03-31 VITALS — BP 125/50 | HR 57 | Temp 97.6°F | Resp 18 | Wt 232.1 lb

## 2021-03-31 DIAGNOSIS — D696 Thrombocytopenia, unspecified: Secondary | ICD-10-CM | POA: Diagnosis not present

## 2021-03-31 DIAGNOSIS — E538 Deficiency of other specified B group vitamins: Secondary | ICD-10-CM | POA: Diagnosis not present

## 2021-03-31 DIAGNOSIS — R911 Solitary pulmonary nodule: Secondary | ICD-10-CM

## 2021-03-31 HISTORY — DX: Deficiency of other specified B group vitamins: E53.8

## 2021-03-31 MED ORDER — CYANOCOBALAMIN 1000 MCG/ML IJ SOLN
1000.0000 ug | Freq: Once | INTRAMUSCULAR | Status: AC
  Administered 2021-03-31: 1000 ug via INTRAMUSCULAR
  Filled 2021-03-31: qty 1

## 2021-03-31 NOTE — Progress Notes (Signed)
Stacey Summers presents today for injection per the provider's orders.  Vitamin B12 injection administration without incident; injection site WNL; see MAR for injection details.  Patient tolerated procedure well and without incident.  Patient remained stable during entire visit. No questions or complaints noted at this time. Patient discharged ambulatory by wheelchair with daughter.

## 2021-03-31 NOTE — Patient Instructions (Signed)
[place] at [facility] Discharge Instructions  You were seen today by Tarri Abernethy PA-C for your low platelets.  As we discussed, your platelets are due to your enlarged spleen and cirrhosis.  They are stable within her baseline range, and do not need treatment at this time.  We will recheck your labs in 4 months.  We will start you on monthly B12 injections due to some decreased B12 levels.  LABS: Return in 4 months for repeat labs  OTHER TESTS: CT chest in 4 months to follow-up on lung nodules  MEDICATIONS: Monthly B12 injections at cancer clinic  FOLLOW-UP APPOINTMENT: Office visit in 4 months, after CT chest and labs   Thank you for choosing [facility] to provide your oncology and hematology care.  To afford each patient quality time with our provider, please arrive at least 15 minutes before your scheduled appointment time.   If you have a lab appointment with the [facility] please come in thru the Main Entrance and check in at the main information desk.  You need to re-schedule your appointment should you arrive 10 or more minutes late.  We strive to give you quality time with our providers, and arriving late affects you and other patients whose appointments are after yours.  Also, if you no show three or more times for appointments you may be dismissed from the clinic at the providers discretion.     Again, thank you for choosing [facility].  Our hope is that these requests will decrease the amount of time that you wait before being seen by our physicians.       _____________________________________________________________  Should you have questions after your visit to [facility], please contact our office at [phone] and follow the prompts.  Our office hours are 8:00 a.m. and 4:30 p.m. Monday - Friday.  Please note that voicemails left after 4:00 p.m. may not be returned until  the following business day.  We are closed weekends and major holidays.  You do have access to a nurse 24-7, just call the main number to the clinic [phone] and do not press any options, hold on the line and a nurse will answer the phone.    For prescription refill requests, have your pharmacy contact our office and allow 72 hours.    Due to Covid, you will need to wear a mask upon entering the hospital. If you do not have a mask, a mask will be given to you at the Main Entrance upon arrival. For doctor visits, patients may have 1 support person age 55 or older with them. For treatment visits, patients can not have anyone with them due to social distancing guidelines and our immunocompromised population.

## 2021-04-05 ENCOUNTER — Ambulatory Visit (INDEPENDENT_AMBULATORY_CARE_PROVIDER_SITE_OTHER): Payer: PPO | Admitting: Family Medicine

## 2021-04-05 ENCOUNTER — Encounter: Payer: Self-pay | Admitting: Family Medicine

## 2021-04-05 ENCOUNTER — Other Ambulatory Visit: Payer: Self-pay

## 2021-04-05 VITALS — BP 142/66 | HR 63 | Temp 97.8°F | Ht 60.75 in | Wt 234.0 lb

## 2021-04-05 DIAGNOSIS — K7581 Nonalcoholic steatohepatitis (NASH): Secondary | ICD-10-CM | POA: Diagnosis not present

## 2021-04-05 DIAGNOSIS — G8929 Other chronic pain: Secondary | ICD-10-CM

## 2021-04-05 DIAGNOSIS — F411 Generalized anxiety disorder: Secondary | ICD-10-CM

## 2021-04-05 DIAGNOSIS — M5442 Lumbago with sciatica, left side: Secondary | ICD-10-CM | POA: Diagnosis not present

## 2021-04-05 DIAGNOSIS — Z79899 Other long term (current) drug therapy: Secondary | ICD-10-CM

## 2021-04-05 DIAGNOSIS — I152 Hypertension secondary to endocrine disorders: Secondary | ICD-10-CM | POA: Diagnosis not present

## 2021-04-05 DIAGNOSIS — K7460 Unspecified cirrhosis of liver: Secondary | ICD-10-CM | POA: Diagnosis not present

## 2021-04-05 DIAGNOSIS — E1159 Type 2 diabetes mellitus with other circulatory complications: Secondary | ICD-10-CM | POA: Diagnosis not present

## 2021-04-05 DIAGNOSIS — E785 Hyperlipidemia, unspecified: Secondary | ICD-10-CM

## 2021-04-05 DIAGNOSIS — E1169 Type 2 diabetes mellitus with other specified complication: Secondary | ICD-10-CM

## 2021-04-05 DIAGNOSIS — E11620 Type 2 diabetes mellitus with diabetic dermatitis: Secondary | ICD-10-CM | POA: Diagnosis not present

## 2021-04-05 LAB — BAYER DCA HB A1C WAIVED: HB A1C (BAYER DCA - WAIVED): 6.8 % (ref <7.0)

## 2021-04-05 MED ORDER — METFORMIN HCL 500 MG PO TABS: 500.0000 mg | ORAL_TABLET | Freq: Two times a day (BID) | ORAL | 3 refills | Status: DC | PRN

## 2021-04-05 MED ORDER — LORAZEPAM 0.5 MG PO TABS: 0.2500 mg | ORAL_TABLET | Freq: Two times a day (BID) | ORAL | 1 refills | Status: DC | PRN

## 2021-04-05 MED ORDER — TRAMADOL HCL 50 MG PO TABS: 50.0000 mg | ORAL_TABLET | Freq: Two times a day (BID) | ORAL | 2 refills | Status: DC | PRN

## 2021-04-05 NOTE — Progress Notes (Signed)
Subjective: CC: Anxiety disorder, chronic pain, diabetes PCP: Janora Norlander, DO Stacey Summers is a 75 y.o. female presenting to clinic today for:  1. Type 2 Diabetes with hypertension, hyperlipidemia:  Compliant with Lipitor, metformin.  Last eye exam: UTD Last foot exam: needs Last A1c:  Lab Results  Component Value Date   HGBA1C 6.8 04/05/2021   Nephropathy screen indicated?: UTD Last flu, zoster and/or pneumovax:  Immunization History  Administered Date(s) Administered   Hepb-cpg 07/19/2018, 08/20/2018   Influenza, High Dose Seasonal PF 08/03/2018   Influenza,inj,Quad PF,6+ Mos 05/28/2014, 06/16/2015, 06/10/2016, 08/05/2020   Influenza,inj,quad, With Preservative 05/15/2017, 08/03/2018   Influenza-Unspecified 04/24/2017   Pneumococcal Conjugate-13 07/21/2014   Pneumococcal Polysaccharide-23 09/21/2015   Tdap 07/07/2009   Zoster Recombinat (Shingrix) 04/24/2017, 04/29/2017, 07/11/2017, 07/12/2017    ROS: No chest pain, shortness of breath outside of baseline (she has a CT scan coming up).  2.  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  Asking for repeat CBC with platelet level today.  Serum B12 injections last week.  She is accompanied today by her daughter who notes that her mentation has been stable.  She is been compliant with her medications.  3.  Anxiety disorder Patient's very sparingly uses Ativan with last refill in April.  Denies any excessive daytime sedation, falls or visual auditory hallucinations.  Chronic pain is stable with very sparing use of tramadol.  She notes that the tramadol is not especially impactful but it is better than nothing.  ROS: Per HPI  Allergies  Allergen Reactions   Latex     "my hands feels numb"   Past Medical History:  Diagnosis Date   Allergy    seasonal   Anxiety    Arthritis    hands, back    B12 deficiency 03/31/2021   Cataract    surgery to remove   Chronic back pain    lower back    Cirrhosis of liver  without ascites ([place])    recent dx 07/11/18   COPD (chronic obstructive pulmonary disease) (HCC)    no inhaler   Diabetes mellitus without complication (HCC)    DJD (degenerative joint disease)    Fibromyalgia    Genetic testing 03/08/2017   Ms. Seelbach underwent genetic counseling and testing for hereditary cancer syndromes on 02/21/2017. Her results were negative for mutations in all 83 genes analyzed by [facility]'s 83-gene Multi-Cancers Panel. 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   History of abnormal mammogram    Hyperlipidemia    Hypertension    Irregular heart beat    Neuromuscular disorder (HCC)    fibromyalgia   Sleep apnea    cpap some   SVD (spontaneous vaginal delivery)    x 2   Tubular adenoma of colon 05/09/12   "innumerable number" of flat polyps    Current Outpatient Medications:    ALPRAZolam (XANAX) 0.25 MG tablet, alprazolam 0.25 mg tablet, Disp: , Rfl:    amLODipine (NORVASC) 5 MG tablet, amlodipine 5 mg tablet, Disp: , Rfl:    atorvastatin (LIPITOR) 40 MG tablet, Take 1 tablet (40 mg total) by mouth daily., Disp: 90 tablet, Rfl: 3   benazepril (LOTENSIN) 20 MG tablet, Take 1 tablet (20 mg total) by mouth daily. NOTE CHANGE  IN DOSE, Disp: 90 tablet, Rfl: 1   Biotin 5000 MCG SUBL, Place under the tongue., Disp: , Rfl:    blood glucose meter kit and supplies KIT, Dispense based on patient and insurance preference. Use up to four times daily as directed. (FOR ICD-9 250.00, 250.01)., Disp: 1 each, Rfl: 10   Blood Glucose Monitoring Suppl (LIBERTY BLOOD GLUCOSE MONITOR) DEVI, 1 each by Does not apply route daily. Test 1x per day and prn-dx250.02, Disp: 1 each, Rfl: 0   Cholecalciferol (VITAMIN D3) 5000 units CAPS, Take 5,000 Units  by mouth daily., Disp: , Rfl:    diltiazem (CARDIZEM CD) 120 MG 24 hr capsule, Take 1 capsule by mouth once daily, Disp: 90 capsule, Rfl: 1   diltiazem (TIAZAC) 120 MG 24 hr capsule, diltiazem CD 120 mg capsule,extended release 24 hr, Disp: , Rfl:    fluticasone (FLONASE) 50 MCG/ACT nasal spray, USE 1 SPRAY(S) IN EACH NOSTRIL IN THE MORNING AND AT BEDTIME, Disp: 48 g, Rfl: 1   furosemide (LASIX) 40 MG tablet, TAKE 1 TABLET BY MOUTH ONCE DAILY IN THE MORNING AND 1/2 (ONE-HALF) ONCE DAILY AT 1 PM, Disp: 135 tablet, Rfl: 0   glucose blood (ONETOUCH VERIO) test strip, Check Bs up to 4 times daily Dx E11.9, Disp: 400 each, Rfl: 3   glucose blood test strip, Test 1x per day and prn- dx 250.02, Disp: 100 each, Rfl: 2   lactulose (CHRONULAC) 10 GM/15ML solution, TAKE 15 ML BY MOUTH THREE TIMES DAILY AS NEEDED, Disp: 946 mL, Rfl: 0   LORazepam (ATIVAN) 0.5 MG tablet, Take 0.5-1 tablets (0.25-0.5 mg total) by mouth 2 (two) times daily as needed for anxiety., Disp: 30 tablet, Rfl: 1   meclizine (ANTIVERT) 25 MG tablet, Take 1 tablet (25 mg total) by mouth 3 (three) times daily as needed for dizziness., Disp: 30 tablet, Rfl: 0   metFORMIN (GLUCOPHAGE) 500 MG tablet, Take 1 tablet (500 mg total) by mouth 2 (two) times daily with a meal., Disp: 180 tablet, Rfl: 3   XIFAXAN 550 MG TABS tablet, Take 1 tablet by mouth twice daily, Disp: 60 tablet, Rfl: 0   albuterol (VENTOLIN HFA) 108 (90 Base) MCG/ACT inhaler, Inhale 2 puffs into the lungs every 6 (six) hours as needed for wheezing or shortness of breath. (Patient not taking: No sig reported), Disp: 18 g, Rfl: 0   cyclobenzaprine (FLEXERIL) 5 MG tablet, TAKE 1 TABLET BY MOUTH THREE TIMES DAILY AS NEEDED FOR MUSCLE SPASMS (NEEDS APPOINTMENT) (Patient not taking: No sig reported), Disp: 30 tablet, Rfl: 0   ondansetron (ZOFRAN) 4 MG tablet, Take 1 tablet (4 mg total) by mouth every 8 (eight) hours as needed for nausea or vomiting. (Patient not taking: No sig reported),  Disp: 30 tablet, Rfl: 1   ONETOUCH DELICA LANCETS 51O MISC, USE ONE LANCET TO CHECK GLUCOSE ONCE DAILY AND AS NEEDED (Patient not taking: No sig reported), Disp: 100 each, Rfl: 0   traMADol (ULTRAM) 50 MG tablet, Take 1 tablet (50 mg total) by mouth every 12 (twelve) hours as needed for moderate pain. (Patient not taking: No sig reported), Disp: 30 tablet, Rfl: 2 Social History   Socioeconomic History   Marital status: Married    Spouse name: [name] "goes by [employer]   Number of children: 3   Years of education: 11   Highest education level: GED or equivalent  Occupational History   Occupation: retired  Tobacco Use   Smoking status: Former    Packs/day: 1.00  Years: 3.00    Pack years: 3.00    Types: Cigarettes    Quit date: 08/15/1969    Years since quitting: 51.6   Smokeless tobacco: Never  Vaping Use   Vaping Use: Never used  Substance and Sexual Activity   Alcohol use: No    Alcohol/week: 0.0 standard drinks   Drug use: No   Sexual activity: Not Currently  Other Topics Concern   Not on file  Social History Narrative   Not on file   Social Determinants of Health   Financial Resource Strain: Low Risk    Difficulty of Paying Living Expenses: Not very hard  Food Insecurity: No Food Insecurity   Worried About Charity fundraiser in the Last Year: Never true   Ran Out of Food in the Last Year: Never true  Transportation Needs: No Transportation Needs   Lack of Transportation (Medical): No   Lack of Transportation (Non-Medical): No  Physical Activity: Inactive   Days of Exercise per Week: 0 days   Minutes of Exercise per Session: 0 min  Stress: No Stress Concern Present   Feeling of Stress : Only a little  Social Connections: Engineer, building services of Communication with Friends and Family: More than three times a week   Frequency of Social Gatherings with Friends and Family: More than three times a week   Attends Religious Services: More than 4 times per year    Active Member of [employer] or Organizations: Yes   Attends Music therapist: More than 4 times per year   Marital Status: Married  Human resources officer Violence: Not At Risk   Fear of Current or Ex-Partner: No   Emotionally Abused: No   Physically Abused: No   Sexually Abused: No   Family History  Problem Relation Age of Onset   Prostate cancer Father        d.89 diagnosed in late-60s   Colon cancer Father        diagnosed at 10   Heart failure Mother        d.37   Heart disease Mother    Colon polyps Brother        polyposis. Underwent partial colectomy at age 39.   Spina bifida Brother        died at birth   Stomach cancer Paternal Aunt        questionable   Kidney cancer Cousin        d.39s maternal first-cousin   Other Other 57       multiple lipomas   Colon cancer Paternal Uncle        in his 22s   Heart failure Maternal Grandmother        d.45   Heart failure Maternal Uncle        d.50   Colon polyps Son 42   Heart attack Son 33   Leukemia Maternal Aunt    Esophageal cancer Neg Hx    Rectal cancer Neg Hx     Objective: Office vital signs reviewed. BP (!) 142/66   Pulse 63   Temp 97.8 F (36.6 C)   Ht 5' 0.75" (1.543 m)   Wt 234 lb (106.1 kg)   SpO2 99%   BMI 44.58 kg/m   Physical Examination:  General: Awake, alert, No acute distress HEENT: Normal; sclera anicteric Cardio: regular rate and rhythm, Q2W9 heard, systolic murmur present Pulm: clear to auscultation bilaterally, no wheezes, rhonchi or rales; normal work of breathing  on room air Extremities: warm, well perfused, 1+ pitting edema to mid shins, no cyanosis or clubbing  Neuro: no tremor  Assessment/ Plan: 75 y.o. female   Type 2 diabetes mellitus with other specified complication, without long-term current use of insulin ([place]) - Plan: CBC with Differential/Platelet, Bayer DCA Hb A1c Waived  Hypertension associated with diabetes ([place])  Hyperlipidemia associated with type 2  diabetes mellitus ([place])  Liver cirrhosis secondary to NASH (HCC)  GAD (generalized anxiety disorder) - Plan: LORazepam (ATIVAN) 0.5 MG tablet  Controlled substance agreement signed - Plan: Drug Screen 10 W/Conf, Serum  Chronic left-sided low back pain with left-sided sciatica - Plan: traMADol (ULTRAM) 50 MG tablet  Sugar remains under excellent control.  I think that given her age and comorbidities allowing for looser parameters of her A1c is warranted.  We discussed the risks of A1c less than 7 in patients greater than 70.  I favor her discontinuing the metformin.  We discussed along with her daughter today her blood sugar parameters and reasons to contact me.  Advised when they do check blood sugars for these to be separated by her lactulose or meals by at least 2 hours.  We will see her back again in 3 months  Blood pressure at goal for age.  No changes  Continue statin for now.  Anxiety disorder stable with as needed use of Ativan.  This is been renewed  Tramadol renewed for as needed use.  Use sparingly.  Controlled substance contract and drug screen obtained as per office policy today  Orders Placed This Encounter  Procedures   CBC with Differential/Platelet   Drug Screen 10 W/Conf, Serum   Bayer DCA Hb A1c Waived   No orders of the defined types were placed in this encounter.    Janora Norlander, DO [facility] [phone]

## 2021-04-15 ENCOUNTER — Other Ambulatory Visit: Payer: Self-pay | Admitting: Cardiovascular Disease

## 2021-04-15 ENCOUNTER — Other Ambulatory Visit: Payer: Self-pay | Admitting: Gastroenterology

## 2021-04-16 LAB — CBC WITH DIFFERENTIAL/PLATELET
Basophils Absolute: 0 10*3/uL (ref 0.0–0.2)
Basos: 1 %
EOS (ABSOLUTE): 0.2 10*3/uL (ref 0.0–0.4)
Eos: 3 %
Hematocrit: 38.1 % (ref 34.0–46.6)
Hemoglobin: 12.7 g/dL (ref 11.1–15.9)
Immature Grans (Abs): 0 10*3/uL (ref 0.0–0.1)
Immature Granulocytes: 0 %
Lymphocytes Absolute: 1.7 10*3/uL (ref 0.7–3.1)
Lymphs: 37 %
MCH: 32.2 pg (ref 26.6–33.0)
MCHC: 33.3 g/dL (ref 31.5–35.7)
MCV: 97 fL (ref 79–97)
Monocytes Absolute: 0.4 10*3/uL (ref 0.1–0.9)
Monocytes: 9 %
Neutrophils Absolute: 2.3 10*3/uL (ref 1.4–7.0)
Neutrophils: 50 %
Platelets: 78 10*3/uL — CL (ref 150–450)
RBC: 3.94 x10E6/uL (ref 3.77–5.28)
RDW: 12.1 % (ref 11.7–15.4)
WBC: 4.6 10*3/uL (ref 3.4–10.8)

## 2021-04-16 LAB — DRUG SCREEN 10 W/CONF, SERUM
Amphetamines, IA: NEGATIVE ng/mL
Barbiturates, IA: NEGATIVE ug/mL
Benzodiazepines, IA: NEGATIVE ng/mL
Cocaine & Metabolite, IA: NEGATIVE ng/mL
Methadone, IA: NEGATIVE ng/mL
Opiates, IA: NEGATIVE ng/mL
Oxycodones, IA: NEGATIVE ng/mL
Phencyclidine, IA: NEGATIVE ng/mL
Propoxyphene, IA: NEGATIVE ng/mL
THC(Marijuana) Metabolite, IA: POSITIVE ng/mL — AB

## 2021-04-16 LAB — THC,MS,WB/SP RFX
Cannabidiol: 19.3 ng/mL
Cannabinoid Confirmation: POSITIVE
Cannabinol: NEGATIVE ng/mL
Carboxy-THC: 4 ng/mL
Hydroxy-THC: NEGATIVE ng/mL
Tetrahydrocannabinol(THC): NEGATIVE ng/mL

## 2021-04-30 ENCOUNTER — Inpatient Hospital Stay (HOSPITAL_COMMUNITY): Payer: PPO | Attending: Hematology

## 2021-04-30 VITALS — BP 151/61 | HR 61 | Temp 97.0°F | Resp 18

## 2021-04-30 DIAGNOSIS — D696 Thrombocytopenia, unspecified: Secondary | ICD-10-CM | POA: Diagnosis not present

## 2021-04-30 DIAGNOSIS — E538 Deficiency of other specified B group vitamins: Secondary | ICD-10-CM | POA: Insufficient documentation

## 2021-04-30 DIAGNOSIS — Z79899 Other long term (current) drug therapy: Secondary | ICD-10-CM | POA: Insufficient documentation

## 2021-04-30 MED ORDER — CYANOCOBALAMIN 1000 MCG/ML IJ SOLN
1000.0000 ug | Freq: Once | INTRAMUSCULAR | Status: AC
  Administered 2021-04-30: 1000 ug via INTRAMUSCULAR
  Filled 2021-04-30: qty 1

## 2021-04-30 NOTE — Patient Instructions (Signed)
[facility]  Discharge Instructions: Thank you for choosing [facility] to provide your oncology and hematology care.  If you have a lab appointment with the [facility], please come in thru the Main Entrance and check in at the main information desk.  Wear comfortable clothing and clothing appropriate for easy access to any Portacath or PICC line.   We strive to give you quality time with your provider. You may need to reschedule your appointment if you arrive late (15 or more minutes).  Arriving late affects you and other patients whose appointments are after yours.  Also, if you miss three or more appointments without notifying the office, you may be dismissed from the clinic at the provider's discretion.      For prescription refill requests, have your pharmacy contact our office and allow 72 hours for refills to be completed.    Today you received the following chemotherapy and/or immunotherapy agents B12      To help prevent nausea and vomiting after your treatment, we encourage you to take your nausea medication as directed.  BELOW ARE SYMPTOMS THAT SHOULD BE REPORTED IMMEDIATELY: *FEVER GREATER THAN 100.4 F (38 C) OR HIGHER *CHILLS OR SWEATING *NAUSEA AND VOMITING THAT IS NOT CONTROLLED WITH YOUR NAUSEA MEDICATION *UNUSUAL SHORTNESS OF BREATH *UNUSUAL BRUISING OR BLEEDING *URINARY PROBLEMS (pain or burning when urinating, or frequent urination) *BOWEL PROBLEMS (unusual diarrhea, constipation, pain near the anus) TENDERNESS IN MOUTH AND THROAT WITH OR WITHOUT PRESENCE OF ULCERS (sore throat, sores in mouth, or a toothache) UNUSUAL RASH, SWELLING OR PAIN  UNUSUAL VAGINAL DISCHARGE OR ITCHING   Items with * indicate a potential emergency and should be followed up as soon as possible or go to the Emergency Department if any problems should occur.  Please show the CHEMOTHERAPY ALERT CARD or IMMUNOTHERAPY ALERT CARD at check-in to the Emergency Department  and triage nurse.  Should you have questions after your visit or need to cancel or reschedule your appointment, please contact [facility] [phone]  and follow the prompts.  Office hours are 8:00 a.m. to 4:30 p.m. Monday - Friday. Please note that voicemails left after 4:00 p.m. may not be returned until the following business day.  We are closed weekends and major holidays. You have access to a nurse at all times for urgent questions. Please call the main number to the clinic [phone] and follow the prompts.  For any non-urgent questions, you may also contact your provider using MyChart. We now offer e-Visits for anyone 27 and older to request care online for non-urgent symptoms. For details visit [email or web address].   Also download the MyChart app! Go to the app store, search "MyChart", open the app, select [facility], and log in with your MyChart username and password.  Due to Covid, a mask is required upon entering the hospital/clinic. If you do not have a mask, one will be given to you upon arrival. For doctor visits, patients may have 1 support person aged 73 or older with them. For treatment visits, patients cannot have anyone with them due to current Covid guidelines and our immunocompromised population.

## 2021-04-30 NOTE — Progress Notes (Signed)
Stacey Summers presents today for B12 injection per the provider's orders.  Stable during administration without incident; injection site WNL; see MAR for injection details.  Patient tolerated procedure well and without incident.  No questions or complaints noted at this time. Discharge from clinic ambulatory in stable condition.  Alert and oriented X 3.  Follow up with [facility] as scheduled.

## 2021-05-01 ENCOUNTER — Other Ambulatory Visit: Payer: Self-pay | Admitting: Family Medicine

## 2021-05-01 DIAGNOSIS — E785 Hyperlipidemia, unspecified: Secondary | ICD-10-CM

## 2021-05-07 ENCOUNTER — Other Ambulatory Visit: Payer: Self-pay | Admitting: Family Medicine

## 2021-05-07 ENCOUNTER — Other Ambulatory Visit: Payer: Self-pay | Admitting: Gastroenterology

## 2021-05-07 DIAGNOSIS — E1169 Type 2 diabetes mellitus with other specified complication: Secondary | ICD-10-CM

## 2021-05-07 DIAGNOSIS — E785 Hyperlipidemia, unspecified: Secondary | ICD-10-CM

## 2021-05-10 ENCOUNTER — Telehealth: Payer: Self-pay | Admitting: Family Medicine

## 2021-05-10 NOTE — Telephone Encounter (Signed)
Aware refill was sent to [facility] on 05/03/21

## 2021-05-10 NOTE — Telephone Encounter (Signed)
  Prescription Request  05/10/2021  Is this a "Controlled Substance" medicine? no Have you seen your PCP in the last 2 weeks? no If YES, route message to pool  -  If NO, patient needs to be scheduled for appointment.  What is the name of the medication or equipment? Atorvastatin  Have you contacted your pharmacy to request a refill? yes  Which pharmacy would you like this sent to? [facility]   Patient notified that their request is being sent to the clinical staff for review and that they should receive a response within 2 business days.   Gottschalk's pt.  She was seen last month for her ckup.  Please call pt.

## 2021-05-20 ENCOUNTER — Other Ambulatory Visit: Payer: Self-pay | Admitting: Cardiovascular Disease

## 2021-05-28 ENCOUNTER — Ambulatory Visit (HOSPITAL_COMMUNITY): Payer: PPO

## 2021-05-31 ENCOUNTER — Inpatient Hospital Stay (HOSPITAL_COMMUNITY): Payer: PPO | Attending: Hematology

## 2021-05-31 ENCOUNTER — Other Ambulatory Visit: Payer: Self-pay

## 2021-05-31 ENCOUNTER — Other Ambulatory Visit: Payer: Self-pay | Admitting: Gastroenterology

## 2021-05-31 VITALS — BP 148/57 | HR 56 | Temp 96.7°F | Resp 18

## 2021-05-31 DIAGNOSIS — E538 Deficiency of other specified B group vitamins: Secondary | ICD-10-CM | POA: Insufficient documentation

## 2021-05-31 DIAGNOSIS — D696 Thrombocytopenia, unspecified: Secondary | ICD-10-CM | POA: Diagnosis not present

## 2021-05-31 DIAGNOSIS — Z79899 Other long term (current) drug therapy: Secondary | ICD-10-CM | POA: Diagnosis not present

## 2021-05-31 MED ORDER — CYANOCOBALAMIN 1000 MCG/ML IJ SOLN
1000.0000 ug | Freq: Once | INTRAMUSCULAR | Status: AC
  Administered 2021-05-31: 1000 ug via INTRAMUSCULAR
  Filled 2021-05-31: qty 1

## 2021-05-31 NOTE — Progress Notes (Signed)
Stacey Summers presents today for B12 injection per the provider's orders.  Stable during administration without incident; injection site WNL; see MAR for injection details.  Patient tolerated procedure well and without incident.  No questions or complaints noted at this time.  Discharge from clinic ambulatory in stable condition.  Alert and oriented X 3.  Follow up with [facility] as scheduled.

## 2021-05-31 NOTE — Patient Instructions (Signed)
[facility]  Discharge Instructions: Thank you for choosing [facility] to provide your oncology and hematology care.  If you have a lab appointment with the [facility], please come in thru the Main Entrance and check in at the main information desk.  Wear comfortable clothing and clothing appropriate for easy access to any Portacath or PICC line.   We strive to give you quality time with your provider. You may need to reschedule your appointment if you arrive late (15 or more minutes).  Arriving late affects you and other patients whose appointments are after yours.  Also, if you miss three or more appointments without notifying the office, you may be dismissed from the clinic at the provider's discretion.      For prescription refill requests, have your pharmacy contact our office and allow 72 hours for refills to be completed.    Today you received the following chemotherapy and/or immunotherapy agents B12      To help prevent nausea and vomiting after your treatment, we encourage you to take your nausea medication as directed.  BELOW ARE SYMPTOMS THAT SHOULD BE REPORTED IMMEDIATELY: *FEVER GREATER THAN 100.4 F (38 C) OR HIGHER *CHILLS OR SWEATING *NAUSEA AND VOMITING THAT IS NOT CONTROLLED WITH YOUR NAUSEA MEDICATION *UNUSUAL SHORTNESS OF BREATH *UNUSUAL BRUISING OR BLEEDING *URINARY PROBLEMS (pain or burning when urinating, or frequent urination) *BOWEL PROBLEMS (unusual diarrhea, constipation, pain near the anus) TENDERNESS IN MOUTH AND THROAT WITH OR WITHOUT PRESENCE OF ULCERS (sore throat, sores in mouth, or a toothache) UNUSUAL RASH, SWELLING OR PAIN  UNUSUAL VAGINAL DISCHARGE OR ITCHING   Items with * indicate a potential emergency and should be followed up as soon as possible or go to the Emergency Department if any problems should occur.  Please show the CHEMOTHERAPY ALERT CARD or IMMUNOTHERAPY ALERT CARD at check-in to the Emergency Department  and triage nurse.  Should you have questions after your visit or need to cancel or reschedule your appointment, please contact [facility] [phone]  and follow the prompts.  Office hours are 8:00 a.m. to 4:30 p.m. Monday - Friday. Please note that voicemails left after 4:00 p.m. may not be returned until the following business day.  We are closed weekends and major holidays. You have access to a nurse at all times for urgent questions. Please call the main number to the clinic [phone] and follow the prompts.  For any non-urgent questions, you may also contact your provider using MyChart. We now offer e-Visits for anyone 63 and older to request care online for non-urgent symptoms. For details visit [email or web address].   Also download the MyChart app! Go to the app store, search "MyChart", open the app, select [facility], and log in with your MyChart username and password.  Due to Covid, a mask is required upon entering the hospital/clinic. If you do not have a mask, one will be given to you upon arrival. For doctor visits, patients may have 1 support person aged 65 or older with them. For treatment visits, patients cannot have anyone with them due to current Covid guidelines and our immunocompromised population.

## 2021-06-01 ENCOUNTER — Encounter: Payer: Self-pay | Admitting: Gastroenterology

## 2021-06-01 ENCOUNTER — Other Ambulatory Visit (INDEPENDENT_AMBULATORY_CARE_PROVIDER_SITE_OTHER): Payer: PPO

## 2021-06-01 ENCOUNTER — Ambulatory Visit: Payer: PPO | Admitting: Gastroenterology

## 2021-06-01 VITALS — BP 124/78 | HR 42 | Ht 60.75 in | Wt 221.2 lb

## 2021-06-01 DIAGNOSIS — K7581 Nonalcoholic steatohepatitis (NASH): Secondary | ICD-10-CM | POA: Diagnosis not present

## 2021-06-01 DIAGNOSIS — K7460 Unspecified cirrhosis of liver: Secondary | ICD-10-CM

## 2021-06-01 LAB — CBC WITH DIFFERENTIAL/PLATELET
Basophils Absolute: 0 10*3/uL (ref 0.0–0.1)
Basophils Relative: 0.7 % (ref 0.0–3.0)
Eosinophils Absolute: 0.2 10*3/uL (ref 0.0–0.7)
Eosinophils Relative: 3.3 % (ref 0.0–5.0)
HCT: 41.3 % (ref 36.0–46.0)
Hemoglobin: 13.8 g/dL (ref 12.0–15.0)
Lymphocytes Relative: 36.2 % (ref 12.0–46.0)
Lymphs Abs: 2.3 10*3/uL (ref 0.7–4.0)
MCHC: 33.4 g/dL (ref 30.0–36.0)
MCV: 97.5 fl (ref 78.0–100.0)
Monocytes Absolute: 0.7 10*3/uL (ref 0.1–1.0)
Monocytes Relative: 11.3 % (ref 3.0–12.0)
Neutro Abs: 3.1 10*3/uL (ref 1.4–7.7)
Neutrophils Relative %: 48.5 % (ref 43.0–77.0)
Platelets: 82 10*3/uL — ABNORMAL LOW (ref 150.0–400.0)
RBC: 4.23 Mil/uL (ref 3.87–5.11)
RDW: 14.5 % (ref 11.5–15.5)
WBC: 6.4 10*3/uL (ref 4.0–10.5)

## 2021-06-01 LAB — COMPREHENSIVE METABOLIC PANEL
ALT: 30 U/L (ref 0–35)
AST: 54 U/L — ABNORMAL HIGH (ref 0–37)
Albumin: 3.3 g/dL — ABNORMAL LOW (ref 3.5–5.2)
Alkaline Phosphatase: 109 U/L (ref 39–117)
BUN: 22 mg/dL (ref 6–23)
CO2: 35 mEq/L — ABNORMAL HIGH (ref 19–32)
Calcium: 9.1 mg/dL (ref 8.4–10.5)
Chloride: 100 mEq/L (ref 96–112)
Creatinine, Ser: 0.92 mg/dL (ref 0.40–1.20)
GFR: 61.91 mL/min (ref >60.00)
Glucose, Bld: 142 mg/dL — ABNORMAL HIGH (ref 70–99)
Potassium: 3.4 mEq/L — ABNORMAL LOW (ref 3.5–5.1)
Sodium: 143 mEq/L (ref 135–145)
Total Bilirubin: 3.3 mg/dL — ABNORMAL HIGH (ref 0.2–1.2)
Total Protein: 6.1 g/dL (ref 6.0–8.3)

## 2021-06-01 LAB — PROTIME-INR
INR: 1.4 ratio — ABNORMAL HIGH (ref 0.8–1.0)
Prothrombin Time: 14.8 s — ABNORMAL HIGH (ref 9.6–13.1)

## 2021-06-01 NOTE — Progress Notes (Signed)
Stacey Summers    [id number]    1945/10/10  Primary Care Physician:Stacey Summers, Stacey Distance, DO  Referring Physician: Janora Norlander, DO [place],  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   Overall she feels much better but there are days when she has decreased energy. She continues to have intermittent episodes of confusion, mood changes and changes in sleep pattern.  She tends to sleep during daytime as she has difficulty falling asleep at night.  According to patient she sleeps during the day because she is bored and there is nothing else to do   Denies any worsening confusion, abdominal distention, melena or rectal bleeding. No vomiting, abdominal bloating or weight gain.    EGD 08/20/19: - Grade I and small (< 5 mm) esophageal varices. - No gross lesions in esophagus. - Portal hypertensive gastropathy. - Normal examined duodenum.   EGD 08/07/2018: - The Z-line was regular and was found 36 cm from the incisors. - No gross lesions were noted in the entire esophagus. No esophageal varices. - The stomach was normal. No gastric varices. - The examined duodenum was normal.   Colonoscopy 08/07/2018 - Four 4 to 9 mm polyps in the transverse colon and in the ascending colon, removed with a cold snare. Resected and retrieved. - Diverticulosis in the sigmoid colon. - Non-bleeding internal hemorrhoids.   Relevant history: History of COVID-19 infection November 2020.  Worsening mental status with confusion and somnolence post Covid.   She has prominent splenorenal shunt, was referred to IR to discuss possible embolization.  But given she has significant improvement of symptoms with Xifaxan twice daily, family and patient preferred to hold off the embolization.     Outpatient  Encounter Medications as of 06/01/2021  Medication Sig   albuterol (VENTOLIN HFA) 108 (90 Base) MCG/ACT inhaler Inhale 2 puffs into the lungs every 6 (six) hours as needed for wheezing or shortness of breath.   amLODipine (NORVASC) 5 MG tablet amlodipine 5 mg tablet   atorvastatin (LIPITOR) 40 MG tablet Take 1 tablet by mouth once daily   benazepril (LOTENSIN) 20 MG tablet Take 1 tablet (20 mg total) by mouth daily. NOTE CHANGE IN DOSE   Biotin 5000 MCG SUBL Place under the tongue.   blood glucose meter kit and supplies KIT Dispense based on patient and insurance preference. Use up to four times daily as directed. (FOR ICD-9 250.00, 250.01).   Blood Glucose Monitoring Suppl (LIBERTY BLOOD GLUCOSE MONITOR) DEVI 1 each by Does not apply route daily. Test 1x per day and prn-dx250.02   Cholecalciferol (VITAMIN D3) 5000 units CAPS Take 5,000 Units by mouth daily.   cyclobenzaprine (FLEXERIL) 5 MG tablet TAKE 1 TABLET BY MOUTH THREE TIMES DAILY AS NEEDED FOR MUSCLE SPASMS (NEEDS APPOINTMENT)   diltiazem (CARDIZEM CD) 120 MG 24 hr capsule Take 1 capsule (120 mg total) by mouth daily. PATIENT NEEDS TO SCHEDULE AN OFFICE VISIT FOR FURTHER REFILLS. (2ND ATTEMPT)   diltiazem (TIAZAC) 120 MG 24 hr capsule diltiazem CD 120 mg capsule,extended release 24 hr   fluticasone (FLONASE) 50 MCG/ACT nasal spray USE 1 SPRAY(S) IN EACH NOSTRIL IN THE MORNING AND AT BEDTIME   furosemide (LASIX) 40 MG tablet TAKE 1 TABLET BY MOUTH ONCE DAILY IN THE MORNING AND 1/2 (ONE-HALF) ONCE  DAILY AT 1 PM   glucose blood (ONETOUCH VERIO) test strip Check Bs up to 4 times daily Dx E11.9   glucose blood test strip Test 1x per day and prn- dx 250.02   lactulose (CHRONULAC) 10 GM/15ML solution TAKE 15 ML BY MOUTH  THREE TIMES DAILY AS NEEDED   LORazepam (ATIVAN) 0.5 MG tablet Take 0.5-1 tablets (0.25-0.5 mg total) by mouth 2 (two) times daily as needed for anxiety.   meclizine (ANTIVERT) 25 MG tablet Take 1 tablet (25 mg total) by mouth 3  (three) times daily as needed for dizziness.   metFORMIN (GLUCOPHAGE) 500 MG tablet Take 1 tablet (500 mg total) by mouth 2 (two) times daily as needed (BG>250).   ondansetron (ZOFRAN) 4 MG tablet Take 1 tablet (4 mg total) by mouth every 8 (eight) hours as needed for nausea or vomiting.   ONETOUCH DELICA LANCETS 53I MISC USE ONE LANCET TO CHECK GLUCOSE ONCE DAILY AND AS NEEDED   traMADol (ULTRAM) 50 MG tablet Take 1 tablet (50 mg total) by mouth every 12 (twelve) hours as needed for moderate pain.   XIFAXAN 550 MG TABS tablet Take 1 tablet by mouth twice daily   No facility-administered encounter medications on file as of 06/01/2021.    Allergies as of 06/01/2021 - Review Complete 05/31/2021  Allergen Reaction Noted   Latex  03/27/2018    Past Medical History:  Diagnosis Date   Allergy    seasonal   Anxiety    Arthritis    hands, back    B12 deficiency 03/31/2021   Cataract    surgery to remove   Chronic back pain    lower back    Cirrhosis of liver without ascites ([place])    recent dx 07/11/18   COPD (chronic obstructive pulmonary disease) (HCC)    no inhaler   Diabetes mellitus without complication (HCC)    DJD (degenerative joint disease)    Fibromyalgia    Genetic testing 03/08/2017   Stacey Summers underwent genetic counseling and testing for hereditary cancer syndromes on 02/21/2017. Her results were negative for mutations in all 83 genes analyzed by [facility]'s 83-gene Multi-Cancers Panel. 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   History of abnormal mammogram    Hyperlipidemia    Hypertension    Irregular heart beat    Neuromuscular disorder (HCC)    fibromyalgia   Sleep apnea    cpap some   SVD (spontaneous vaginal delivery)    x  2   Tubular adenoma of colon 05/09/12   "innumerable number" of flat polyps    Past Surgical History:  Procedure Laterality Date   CARPAL TUNNEL RELEASE Left 10/20/14   [facility]   CATARACT EXTRACTION, BILATERAL     CESAREAN SECTION  1977   CHOLECYSTECTOMY  1990   COLONOSCOPY     IR RADIOLOGIST EVAL & MGMT  07/16/2019   KNEE ARTHROSCOPY WITH SUBCHONDROPLASTY Right 03/27/2018   Procedure: Right knee arthroscopic partial medial meniscus repair versus menisectomy with medial tibia subchondroplasty;  Surgeon: Nicholes Stairs, MD;  Location: [facility];  Service: Orthopedics;  Laterality: Right;  120 mins   LARYNX SURGERY     POLYP REMOVAL   MULTIPLE TOOTH EXTRACTIONS     POLYPECTOMY  vocal cord   TONSILLECTOMY  1965   TUBAL LIGATION     TUMOR REMOVAL     benign fatty tumors on abdomen   UPPER GASTROINTESTINAL ENDOSCOPY      Family History  Problem Relation Age of Onset   Prostate cancer Father        d.89 diagnosed in late-60s   Colon cancer Father        diagnosed at 49   Heart failure Mother        d.37   Heart disease Mother    Colon polyps Brother        polyposis. Underwent partial colectomy at age 74.   Spina bifida Brother        died at birth   Stomach cancer Paternal Aunt        questionable   Kidney cancer Cousin        d.82s maternal first-cousin   Other Other 1       multiple lipomas   Colon cancer Paternal Uncle        in his 60s   Heart failure Maternal Grandmother        d.45   Heart failure Maternal Uncle        d.50   Colon polyps Son 19   Heart attack Son 68   Leukemia Maternal Aunt    Esophageal cancer Neg Hx    Rectal cancer Neg Hx     Social History   Socioeconomic History   Marital status: Married    Spouse name: [name] "goes by [employer]"   Number of children: 3   Years of education: 11   Highest education level: GED or equivalent  Occupational History   Occupation: retired  Tobacco Use   Smoking status: Former    Packs/day: 1.00     Years: 3.00    Pack years: 3.00    Types: Cigarettes    Quit date: 08/15/1969    Years since quitting: 51.8   Smokeless tobacco: Never  Vaping Use   Vaping Use: Never used  Substance and Sexual Activity   Alcohol use: No    Alcohol/week: 0.0 standard drinks   Drug use: No   Sexual activity: Not Currently  Other Topics Concern   Not on file  Social History Narrative   Not on file   Social Determinants of Health   Financial Resource Strain: Low Risk    Difficulty of Paying Living Expenses: Not very hard  Food Insecurity: No Food Insecurity   Worried About Charity fundraiser in the Last Year: Never true   Ran Out of Food in the Last Year: Never true  Transportation Needs: No Transportation Needs   Lack of Transportation (Medical): No   Lack of Transportation (Non-Medical): No  Physical Activity: Inactive   Days of Exercise per Week: 0 days   Minutes of Exercise per Session: 0 min  Stress: No Stress Concern Present   Feeling of Stress : Only a little  Social Connections: Engineer, building services of Communication with Friends and Family: More than three times a week   Frequency of Social Gatherings with Friends and Family: More than three times a week   Attends Religious Services: More than 4 times per year   Active Member of [employer] or Organizations: Yes   Attends Music therapist: More than 4 times per year   Marital Status: Married  Human resources officer Violence: Not At Risk   Fear of Current or Ex-Partner:  No   Emotionally Abused: No   Physically Abused: No   Sexually Abused: No      Review of systems: All other review of systems negative except as mentioned in the HPI.   Physical Exam: Vitals:   06/01/21 1502  BP: 124/78  Pulse: (!) 42  SpO2: 97%   Body mass index is 42.15 kg/m. Gen:      No acute distress HEENT:  sclera anicteric Abd:      soft, non-tender; no palpable masses, no distension Ext:    No edema Neuro: alert and oriented  x 3 Psych: normal mood and affect  Data Reviewed:  Reviewed labs, radiology imaging, old records and pertinent past GI work up   Assessment and Plan/Recommendations:  75 year old very pleasant with history of Stacey Summers cirrhosis decompensated with hepatic encephalopathy   Overall clinical status is stable Meld score remains low, 12   Follow-up CBC, CMP & PT/ INR and afp   Chronic fatigue secondary to comorbid conditions including cirrhosis Vitamin D and B12 level within normal range   Hepatocellular screening: Up-to-date, no hepatic lesions on ultrasound, AFP within normal range   Chronic thrombocytopenia: Secondary to portal hypertension in the setting of [place] cirrhosis   Volume status: Stable Currently on low-dose Lasix for peripheral edema   Hepatic encephalopathy: Continue Xifaxan twice daily and lactulose as needed with goal 3 soft bowel movements daily   Small esophageal varices on EGD in January 2021   History of advanced adenomatous colon polyps Discussed with patient that given her age and comorbidities with cirrhosis, thrombocytopenia and coagulopathy, is at significant risk for complications associated with procedure and anesthesia.  Do not recommend ongoing surveillance colonoscopy.  Her daughter was in agreement that her potential risk is significantly higher than benefit was not in complete agreement, she said if her liver function or clinical condition worsens she may be eligible for transplant sooner  Continue with low-carb and low-fat diet, avoid artificial sweeteners, high fructose corn syrup. Daily exercise as tolerated   Patient expressed displeasure and feels that she is not getting appropriate care and is not being considered for transplant, she was seen by [facility] and was informed that her meld is too low to be listed for transplant.  I informed her that [facility] does not have transplant program.  Patient wants to follow-up at a transplant  center for her liver disease so she could receive a liver transplant if her liver condition worsens Will refer to [facility] for ongoing liver care and she will return here for follow-up as needed  This visit required >40 minutes of patient care (this includes precharting, chart review, review of results, face-to-face time used for counseling as well as treatment plan and follow-up. The patient was provided an opportunity to ask questions and all were answered. The patient agreed with the plan and demonstrated an understanding of the instructions.  Damaris Hippo , MD    CC: Stacey Norlander, DO

## 2021-06-01 NOTE — Patient Instructions (Signed)
Your provider has requested that you go to the basement level for lab work before leaving today. Press "B" on the elevator. The lab is located at the first door on the left as you exit the elevator.   We will refer you to [facility]   If you are age 73 or older, your body mass index should be between 23-30. Your Body mass index is 42.15 kg/m. If this is out of the aforementioned range listed, please consider follow up with your Primary Care Provider.  If you are age 17 or younger, your body mass index should be between 19-25. Your Body mass index is 42.15 kg/m. If this is out of the aformentioned range listed, please consider follow up with your Primary Care Provider.   ________________________________________________________  The [facility] providers would like to encourage you to use [facility] to communicate with providers for non-urgent requests or questions.  Due to long hold times on the telephone, sending your provider a message by [facility] may be a faster and more efficient way to get a response.  Please allow 48 business hours for a response.  Please remember that this is for non-urgent requests.  _______________________________________________________

## 2021-06-02 LAB — AFP TUMOR MARKER: AFP-Tumor Marker: 4.7 ng/mL

## 2021-06-04 ENCOUNTER — Telehealth: Payer: Self-pay | Admitting: Family Medicine

## 2021-06-04 MED ORDER — ONETOUCH VERIO FLEX SYSTEM W/DEVICE KIT: PACK | 0 refills | Status: DC

## 2021-06-04 NOTE — Telephone Encounter (Signed)
Pt aware refill sent to pharmacy 

## 2021-06-04 NOTE — Telephone Encounter (Signed)
  Prescription Request  06/04/2021  Is this a "Controlled Substance" medicine? No  Have you seen your PCP in the last 2 weeks? No  If YES, route message to pool  -  If NO, patient needs to be scheduled for appointment.  What is the name of the medication or equipment?One Touch - Verio Meter. Her's quit working  Have you contacted your pharmacy to request a refill? YES  Which pharmacy would you like this sent to? [facility] in [place]   Patient notified that their request is being sent to the clinical staff for review and that they should receive a response within 2 business days.

## 2021-06-15 ENCOUNTER — Encounter: Payer: Self-pay | Admitting: Gastroenterology

## 2021-06-16 ENCOUNTER — Telehealth: Payer: Self-pay | Admitting: Gastroenterology

## 2021-06-16 ENCOUNTER — Telehealth: Payer: Self-pay | Admitting: *Deleted

## 2021-06-16 NOTE — Telephone Encounter (Signed)
Called patient  to inform she should hear from [facility] (Dawn Drazic) within the next week or so See other Referral phone note  Dr Silverio Decamp , patients daughter wants to know if she needs any further testing from you before she is seen by [facility]. She said it seems like you told them she needed a follow up UD of her liver but she wasn't sure

## 2021-06-16 NOTE — Telephone Encounter (Signed)
Inbound call from patient daughter [name]. States patient was to get a referral to [facility] but have not received a call to schedule appt. Best contact number [phone]

## 2021-06-16 NOTE — Telephone Encounter (Signed)
Sending to [place].

## 2021-06-16 NOTE — Telephone Encounter (Signed)
Called [facility] to see how long they was booking out. She said Dawn personally reviews all the records herself and decides when the patient needs to be seen   Will notify patient to inform her they should be calling her with in the next week

## 2021-06-18 ENCOUNTER — Other Ambulatory Visit: Payer: Self-pay

## 2021-06-18 MED ORDER — RIFAXIMIN 550 MG PO TABS: 550.0000 mg | ORAL_TABLET | Freq: Two times a day (BID) | ORAL | 0 refills | Status: DC

## 2021-06-18 NOTE — Telephone Encounter (Signed)
Xifaxan refilled to [facility] , per [facility] she is on this for her cirrhosis.

## 2021-06-19 ENCOUNTER — Other Ambulatory Visit: Payer: Self-pay | Admitting: Gastroenterology

## 2021-06-20 NOTE — Progress Notes (Signed)
Cardiology Clinic Note   Patient Name: Stacey Summers Date of Encounter: 06/21/2021  Primary Care Provider:  Janora Norlander, DO Primary Cardiologist:  Sanda Klein, MD  Patient Profile    Stacey Summers. [place] 75 year old female presents today for follow-up of her hypertension, OSA, hyperlipidemia, murmur and dyspnea on exertion.  Past Medical History    Past Medical History:  Diagnosis Date   Allergy    seasonal   Anxiety    Arthritis    hands, back    B12 deficiency 03/31/2021   Cataract    surgery to remove   Chronic back pain    lower back    Cirrhosis of liver without ascites ([place])    recent dx 07/11/18   COPD (chronic obstructive pulmonary disease) (HCC)    no inhaler   Diabetes mellitus without complication (HCC)    DJD (degenerative joint disease)    Fibromyalgia    Genetic testing 03/08/2017   Stacey Summers underwent genetic counseling and testing for hereditary cancer syndromes on 02/21/2017. Her results were negative for mutations in all 83 genes analyzed by [facility]'s 83-gene Multi-Cancers Panel. 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   History of abnormal mammogram    Hyperlipidemia    Hypertension    Irregular heart beat    Neuromuscular disorder (HCC)    fibromyalgia   Sleep apnea    cpap some   SVD (spontaneous vaginal delivery)    x 2   Tubular adenoma of colon 05/09/12   "innumerable number" of flat polyps   Past Surgical History:  Procedure Laterality Date   CARPAL TUNNEL RELEASE Left 10/20/14   [facility]   CATARACT EXTRACTION, BILATERAL     CESAREAN SECTION  1977   CHOLECYSTECTOMY  1990   COLONOSCOPY     IR RADIOLOGIST EVAL & MGMT  07/16/2019   KNEE ARTHROSCOPY WITH SUBCHONDROPLASTY Right 03/27/2018    Procedure: Right knee arthroscopic partial medial meniscus repair versus menisectomy with medial tibia subchondroplasty;  Surgeon: Nicholes Stairs, MD;  Location: [facility];  Service: Orthopedics;  Laterality: Right;  120 mins   LARYNX SURGERY     POLYP REMOVAL   MULTIPLE TOOTH EXTRACTIONS     POLYPECTOMY     vocal cord   TONSILLECTOMY  1965   TUBAL LIGATION     TUMOR REMOVAL     benign fatty tumors on abdomen   UPPER GASTROINTESTINAL ENDOSCOPY      Allergies  Allergies  Allergen Reactions   Latex     "my hands feels numb"    History of Present Illness    Stacey Summers has a past medical history of morbid obesity, OSA, essential hypertension, lipidemia, and chronic leg edema.   She was seen by Dr. Sallyanne Kuster in May 2020 with complaints of palpitations.  Her amlodipine was stopped with the intention of starting diltiazem.  However, she never started the diltiazem and on follow-up her palpitations have subsided.  Her blood pressure remained stable and controlled.  No further medications were started at that time.     She has obstructive sleep apnea but is not always compliant with her CPAP.  On last evaluation she wore it more than 50% of the time.  Her concern with the device  is that it feels like a smothering/constricting at times.   She is known to have chronic back pain which she receives injections for.  Dr. Tonita Cong is also started discussing the need for back surgery.   During her last visit 01/25/2019 she mentions being concerned about blood clots.  She indicated that her mother died suddenly without clear explanation by postmortem at age 43.  Her brother had several comorbidities including diabetes.  He died in his sleep at age 66.  She indicated that she thought this may be due to blood clots.   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   She presented to the 09/17/2019 for follow-up and stated her home health nurse indicated that she was having frequent palpitations 09/13/19.  She did not feel these.  She indicated that she  had palpitations for quite a long time.  This was something that she had been aware of at times but usually unaware.  She had not taken her diltiazem 120 mg after being taken off of her amlodipine until 09/13/2019 when she started having increased palpitations.  She was tolerating the medication well and stated that she was not feeling palpitations at the time.  Her physical activity was limited due to her chronic back pain.  She stated she had lost some weight due to changes in her diet and having to avoid certain foods due to her cirrhosis.  She was seen in follow-up by Dr. Sallyanne Kuster on 12/25/2019.  During that time she reported that her palpitations had improved with continued use of her diltiazem.  She was noted to have mild lower extremity swelling.  Her palpitations were intermittent.  She denied chest pain shortness of breath.  She was limited in her mobility by her back and bilateral knee pain.  She was only able to stand for about 5 minutes before developing back pain.  She continued to have intermittent compliance with her CPAP due to her insomnia.  She denied bleeding issues.  Due to her thrombocytopenia she was discouraged from having back surgery.  Her EKG showed sinus rhythm with PVCs.  By report her blood pressure continued to be well controlled.  She presents to the clinic today for follow-up evaluation states she remains somewhat fatigued.  She expresses frustration about her liver cirrhosis and how her PCP did not catch it sooner.  She reports that for several years she asked why her liver numbers were elevated.  She does not feel she ever got a good answer.  She continues to be compliant with her lactulose and her daughter reports that she sleeps for longer periods of time.   She denies increased palpitations and changes with her breathing.  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  I will give her the salty 6 diet sheet, repeat fasting lipids and LFTs, have her increase her physical activity as tolerated and plan follow-up in 6 months.   She denies chest pain, shortness of breath, lower extremity edema, fatigue, increased palpitations, melena, hematuria, hemoptysis, diaphoresis, weakness, presyncope, syncope, orthopnea, and PND.  Home Medications    Prior to Admission medications   Medication Sig Start Date End Date Taking? Authorizing Provider  albuterol (VENTOLIN HFA) 108 (90 Base) MCG/ACT inhaler Inhale 2 puffs into the lungs every  6 (six) hours as needed for wheezing or shortness of breath. 11/18/20   Loman Brooklyn, FNP  amLODipine (NORVASC) 5 MG tablet amlodipine 5 mg tablet    [provider]  atorvastatin (LIPITOR) 40 MG tablet Take 1 tablet by mouth once daily 05/03/21   Ronnie Doss M, DO  benazepril (LOTENSIN) 20 MG tablet Take 1 tablet (20 mg total) by mouth daily. NOTE CHANGE IN DOSE 12/09/20   Janora Norlander, DO  Biotin 5000 MCG SUBL Place under the tongue.    [provider]  blood glucose meter kit and supplies KIT Dispense based on patient and insurance preference. Use up to four times daily as directed. (FOR ICD-9 250.00, 250.01). 07/04/19   Claretta Fraise, MD  Blood Glucose Monitoring Suppl ([place]) w/Device KIT Check Bs up to 4 times daily Dx E11.9, 06/04/21   Ronnie Doss M, DO  Cholecalciferol (VITAMIN D3) 5000 units CAPS Take 5,000 Units by mouth daily.    [provider]  cyclobenzaprine (FLEXERIL) 5 MG tablet TAKE 1 TABLET BY MOUTH THREE TIMES DAILY AS NEEDED FOR MUSCLE SPASMS (NEEDS APPOINTMENT) 01/22/18   Hassell Done, Mary-Margaret, FNP  diltiazem (CARDIZEM CD) 120 MG 24 hr capsule Take 1 capsule (120 mg  total) by mouth daily. PATIENT NEEDS TO SCHEDULE AN OFFICE VISIT FOR FURTHER REFILLS. (2ND ATTEMPT) 05/21/21   Croitoru, Dani Gobble, MD  fluticasone (FLONASE) 50 MCG/ACT nasal spray USE 1 SPRAY(S) IN EACH NOSTRIL IN THE MORNING AND AT BEDTIME 01/04/21   Ronnie Doss M, DO  furosemide (LASIX) 40 MG tablet TAKE 1 TABLET BY MOUTH ONCE DAILY IN THE MORNING AND 1/2 (ONE-HALF) ONCE DAILY AT 1 PM 03/29/21   Ronnie Doss M, DO  glucose blood (ONETOUCH VERIO) test strip Check Bs up to 4 times daily Dx E11.9 07/06/20   Ronnie Doss M, DO  glucose blood test strip Test 1x per day and prn- dx 250.02 04/25/14   Hassell Done, Mary-Margaret, FNP  lactulose (CHRONULAC) 10 GM/15ML solution TAKE 15 ML BY MOUTH  THREE TIMES DAILY AS NEEDED 05/31/21   Nandigam, Venia Minks, MD  LORazepam (ATIVAN) 0.5 MG tablet Take 0.5-1 tablets (0.25-0.5 mg total) by mouth 2 (two) times daily as needed for anxiety. 04/05/21   Janora Norlander, DO  meclizine (ANTIVERT) 25 MG tablet Take 1 tablet (25 mg total) by mouth 3 (three) times daily as needed for dizziness. 10/14/19   Janora Norlander, DO  ondansetron (ZOFRAN) 4 MG tablet Take 1 tablet (4 mg total) by mouth every 8 (eight) hours as needed for nausea or vomiting. 07/01/19   Mauri Pole, MD  ONETOUCH DELICA LANCETS 88F MISC USE ONE LANCET TO CHECK GLUCOSE ONCE DAILY AND AS NEEDED 10/10/17   Hassell Done, Mary-Margaret, FNP  rifaximin (XIFAXAN) 550 MG TABS tablet Take 1 tablet (550 mg total) by mouth 2 (two) times daily. 06/18/21   Mauri Pole, MD  traMADol (ULTRAM) 50 MG tablet Take 1 tablet (50 mg total) by mouth every 12 (twelve) hours as needed for moderate pain. 04/05/21   Janora Norlander, DO    Family History    Family History  Problem Relation Age of Onset   Prostate cancer Father        d.89 diagnosed in late-60s   Colon cancer Father        diagnosed at 31   Heart failure Mother        d.37   Heart disease Mother    Colon polyps Brother  polyposis. Underwent partial colectomy at age 3.   Spina bifida Brother        died at birth   Stomach cancer Paternal Aunt        questionable   Kidney cancer Cousin        d.39s maternal first-cousin   Other Other 7       multiple lipomas   Colon cancer Paternal Uncle        in his 44s   Heart failure Maternal Grandmother        d.45   Heart failure Maternal Uncle        d.50   Colon polyps Son 57   Heart attack Son 76   Leukemia Maternal Aunt    Esophageal cancer Neg Hx    Rectal cancer Neg Hx    She indicated that her mother is deceased. She indicated that her father is deceased. She indicated that both of her brothers are deceased. She indicated that her maternal grandmother is deceased. She indicated that her maternal grandfather is deceased. She indicated that her paternal grandmother is deceased. She indicated that her paternal grandfather is deceased. She indicated that her daughter is alive. She indicated that both of her sons are alive. She indicated that one of her two maternal aunts is deceased. She indicated that her maternal uncle is deceased. She indicated that only one of her four paternal aunts is alive. She indicated that her paternal uncle is alive. She indicated that her cousin is deceased. She indicated that the status of her neg hx is unknown. She indicated that her other is alive.  Social History    Social History   Socioeconomic History   Marital status: Married    Spouse name: [name] "goes by [employer]   Number of children: 3   Years of education: 11   Highest education level: GED or equivalent  Occupational History   Occupation: retired  Tobacco Use   Smoking status: Former    Packs/day: 1.00    Years: 3.00    Pack years: 3.00    Types: Cigarettes    Quit date: 08/15/1969    Years since quitting: 51.8   Smokeless tobacco: Never  Vaping Use   Vaping Use: Never used  Substance and Sexual Activity   Alcohol use: No    Alcohol/week: 0.0 standard  drinks   Drug use: No   Sexual activity: Not Currently  Other Topics Concern   Not on file  Social History Narrative   Not on file   Social Determinants of Health   Financial Resource Strain: Low Risk    Difficulty of Paying Living Expenses: Not very hard  Food Insecurity: No Food Insecurity   Worried About Charity fundraiser in the Last Year: Never true   Ran Out of Food in the Last Year: Never true  Transportation Needs: No Transportation Needs   Lack of Transportation (Medical): No   Lack of Transportation (Non-Medical): No  Physical Activity: Inactive   Days of Exercise per Week: 0 days   Minutes of Exercise per Session: 0 min  Stress: No Stress Concern Present   Feeling of Stress : Only a little  Social Connections: Engineer, building services of Communication with Friends and Family: More than three times a week   Frequency of Social Gatherings with Friends and Family: More than three times a week   Attends Religious Services: More than 4 times per year   Active Member of Clubs or  Organizations: Yes   Attends Music therapist: More than 4 times per year   Marital Status: Married  Human resources officer Violence: Not At Risk   Fear of Current or Ex-Partner: No   Emotionally Abused: No   Physically Abused: No   Sexually Abused: No     Review of Systems    General:  No chills, fever, night sweats or weight changes.  Cardiovascular:  No chest pain, dyspnea on exertion, edema, orthopnea, palpitations, paroxysmal nocturnal dyspnea. Dermatological: No rash, lesions/masses Respiratory: No cough, dyspnea Urologic: No hematuria, dysuria Abdominal:   No nausea, vomiting, diarrhea, bright red blood per rectum, melena, or hematemesis Neurologic:  No visual changes, wkns, changes in mental status. All other systems reviewed and are otherwise negative except as noted above.  Physical Exam    VS:  BP 130/66 (BP Location: Right Arm)   Pulse 64   Ht 5' 1.5" (1.562  m)   Wt 223 lb 6.4 oz (101.3 kg)   SpO2 98%   BMI 41.53 kg/m  , BMI Body mass index is 41.53 kg/m. GEN: Well nourished, well developed, in no acute distress. HEENT: normal. Neck: Supple, no JVD, carotid bruits, or masses. Cardiac: RRR, no murmurs, rubs, or gallops. No clubbing, cyanosis, generalized bilateral lower extremity nonpitting edema.  Radials/DP/PT 2+ and equal bilaterally.  Respiratory:  Respirations regular and unlabored, clear to auscultation bilaterally. GI: Soft, nontender, nondistended, BS + x 4. MS: no deformity or atrophy. Skin: warm and dry, no rash. Neuro:  Strength and sensation are intact. Psych: Normal affect.  Accessory Clinical Findings    Recent Labs: 06/01/2021: ALT 30; BUN 22; Creatinine, Ser 0.92; Hemoglobin 13.8; Platelets 82.0; Potassium 3.4; Sodium 143   Recent Lipid Panel    Component Value Date/Time   CHOL 103 02/06/2018 1522   CHOL 162 12/20/2012 1044   TRIG 83 02/06/2018 1522   TRIG 91 12/25/2014 1020   TRIG 83 12/20/2012 1044   HDL 32 (L) 02/06/2018 1522   HDL 42 12/25/2014 1020   HDL 42 12/20/2012 1044   CHOLHDL 3.2 02/06/2018 1522   LDLCALC 54 02/06/2018 1522   LDLCALC 59 01/29/2014 1031   LDLCALC 103 (H) 12/20/2012 1044    ECG personally reviewed by me today-sinus rhythm with occasional PVCs 64 bpm- No acute changes  EKG 10/07/2019 sinus bradycardia with sinus arrhythmia with occasional premature ventricular complexes 56 bpm- No acute changes   EKG 02/06/2018 Sinus rhythm with frequent 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 Systolic    function was normal. The estimated ejection fraction was    in the range of 55% to 60%. Wall motion was normal; there    were no regional wall motion abnormalities. Left    ventricular diastolic function parameters were normal.  - Left atrium: The atrium was mildly dilated.   Assessment & Plan   1.  Essential hypertension-BP today 130/66.  Well-controlled at home. Continue continue diltiazem 120 mg daily Heart healthy low-sodium diet-salty 6 given Increase physical activity as tolerated   Palpitations-remains cardiac unaware.  No changes in her breathing or activity tolerance. Continue diltiazem  Avoid triggers caffeine, chocolate, EtOH, sleep apnea, etc. Heart healthy low-sodium diet Increase physical activity as tolerated Continue to monitor   Hyperlipidemia-.LDL 54 02/06/2018  Continue atorvastatin 40 mg daily Heart healthy low-sodium hepatic diet Increase physical activity as tolerated Repeat fasting lipids and LFTs   Obstructive sleep  apnea-reports compliance.  Waking up well rested. Continue CPAP use Continue weight loss.   Cirrhosis- unknown etiology.  It is suspected that her fatty liver disease is causing her thrombocytopenia. Continue lactulose  Encouraged weight loss   Morbid obesity-weight today 223.4 pounds up from 210 pounds on 06/26/2019 Heart healthy low-sodium diet Increase physical activity as tolerated Continue weight loss   Disposition: Follow-up with Dr. Sallyanne Kuster or me in 6 months.  Jossie Ng. Dasani Thurlow NP-C    06/21/2021, 11:59 AM [place] [address] Office [phone] Fax [phone]  Notice: This dictation was prepared with Dragon dictation along with smaller phrase technology. Any transcriptional errors that result from this process are unintentional and may not be corrected upon review.  I spent 14 minutes examining this patient, reviewing medications, and using patient centered shared decision making involving her cardiac care.  Prior to her visit I spent greater than 20 minutes reviewing her past medical history,  medications, and prior cardiac tests.

## 2021-06-21 ENCOUNTER — Other Ambulatory Visit: Payer: Self-pay | Admitting: Family Medicine

## 2021-06-21 ENCOUNTER — Ambulatory Visit (INDEPENDENT_AMBULATORY_CARE_PROVIDER_SITE_OTHER): Payer: PPO | Admitting: General Practice

## 2021-06-21 ENCOUNTER — Other Ambulatory Visit: Payer: Self-pay

## 2021-06-21 ENCOUNTER — Encounter: Payer: Self-pay | Admitting: General Practice

## 2021-06-21 VITALS — BP 130/66 | HR 64 | Ht 61.5 in | Wt 223.4 lb

## 2021-06-21 DIAGNOSIS — Z79899 Other long term (current) drug therapy: Secondary | ICD-10-CM

## 2021-06-21 DIAGNOSIS — K7469 Other cirrhosis of liver: Secondary | ICD-10-CM | POA: Diagnosis not present

## 2021-06-21 DIAGNOSIS — I10 Essential (primary) hypertension: Secondary | ICD-10-CM

## 2021-06-21 DIAGNOSIS — I152 Hypertension secondary to endocrine disorders: Secondary | ICD-10-CM

## 2021-06-21 DIAGNOSIS — G4733 Obstructive sleep apnea (adult) (pediatric): Secondary | ICD-10-CM

## 2021-06-21 DIAGNOSIS — E785 Hyperlipidemia, unspecified: Secondary | ICD-10-CM

## 2021-06-21 DIAGNOSIS — R002 Palpitations: Secondary | ICD-10-CM

## 2021-06-21 DIAGNOSIS — E1169 Type 2 diabetes mellitus with other specified complication: Secondary | ICD-10-CM

## 2021-06-21 NOTE — Patient Instructions (Signed)
Medication Instructions:  The current medical regimen is effective;  continue present plan and medications as directed. Please refer to the Current Medication list given to you today.  *If you need a refill on your cardiac medications before your next appointment, please call your pharmacy*  Lab Work:    FASTING LIPID AND LFT      Special Instructions PLEASE READ AND FOLLOW SALTY 6-ATTACHED-1,854m daily  PLEASE INCREASE PHYSICAL ACTIVITY AS TOLERATED  CONTINUE WEIGHT LOSS  Follow-Up: Your next appointment:  6 month(s) In Person with MSanda Klein MD    Please call our office 2 months in advance to schedule this appointment   At [facility] you and your health needs are our priority.  As part of our continuing mission to provide you with exceptional heart care, we have created designated Provider Care Teams.  These Care Teams include your primary Cardiologist (physician) and Advanced Practice Providers (APPs -  Physician Assistants and Nurse Practitioners) who all work together to provide you with the care you need, when you need it.            6 SALTY THINGS TO AVOID     1,800MG DAILY

## 2021-06-22 NOTE — Telephone Encounter (Signed)
She is due for abdominal ultrasound in January 2023 for hepatocellular carcinoma screening.  She can either do it here or through [facility] at [facility].

## 2021-06-23 NOTE — Telephone Encounter (Signed)
December 7th for patients appointment with Oley Balm   Explained to Stacey Summers the daughter her mom will need an abdominal US in Jan.  She will decide where to have it later

## 2021-06-23 NOTE — Telephone Encounter (Signed)
Appointment is Dec 7th spoke with [name] the patients daughter

## 2021-06-25 ENCOUNTER — Other Ambulatory Visit: Payer: Self-pay | Admitting: Cardiovascular Disease

## 2021-06-28 ENCOUNTER — Inpatient Hospital Stay (HOSPITAL_COMMUNITY): Payer: PPO | Attending: Hematology

## 2021-06-28 ENCOUNTER — Other Ambulatory Visit: Payer: Self-pay

## 2021-06-28 VITALS — BP 132/55 | HR 50 | Temp 98.2°F | Resp 18

## 2021-06-28 DIAGNOSIS — E785 Hyperlipidemia, unspecified: Secondary | ICD-10-CM | POA: Diagnosis not present

## 2021-06-28 DIAGNOSIS — E538 Deficiency of other specified B group vitamins: Secondary | ICD-10-CM | POA: Diagnosis not present

## 2021-06-28 DIAGNOSIS — I10 Essential (primary) hypertension: Secondary | ICD-10-CM | POA: Diagnosis not present

## 2021-06-28 DIAGNOSIS — G4733 Obstructive sleep apnea (adult) (pediatric): Secondary | ICD-10-CM | POA: Diagnosis not present

## 2021-06-28 DIAGNOSIS — Z79899 Other long term (current) drug therapy: Secondary | ICD-10-CM | POA: Insufficient documentation

## 2021-06-28 DIAGNOSIS — K7469 Other cirrhosis of liver: Secondary | ICD-10-CM | POA: Diagnosis not present

## 2021-06-28 DIAGNOSIS — R002 Palpitations: Secondary | ICD-10-CM | POA: Diagnosis not present

## 2021-06-28 MED ORDER — CYANOCOBALAMIN 1000 MCG/ML IJ SOLN
1000.0000 ug | Freq: Once | INTRAMUSCULAR | Status: AC
  Administered 2021-06-28: 1000 ug via INTRAMUSCULAR
  Filled 2021-06-28: qty 1

## 2021-06-28 NOTE — Progress Notes (Signed)
Patient tolerated Vitamin B12 injection with no complaints voiced.  Site clean and dry with no bruising or swelling noted.  No complaints of pain.  Discharged with vital signs stable and no signs or symptoms of distress noted.

## 2021-06-28 NOTE — Patient Instructions (Signed)
[facility]  Discharge Instructions: Thank you for choosing [facility] to provide your oncology and hematology care.  If you have a lab appointment with the Cancer Center, please come in thru the Main Entrance and check in at the main information desk.  Wear comfortable clothing and clothing appropriate for easy access to any Portacath or PICC line.   We strive to give you quality time with your provider. You may need to reschedule your appointment if you arrive late (15 or more minutes).  Arriving late affects you and other patients whose appointments are after yours.  Also, if you miss three or more appointments without notifying the office, you may be dismissed from the clinic at the provider's discretion.      For prescription refill requests, have your pharmacy contact our office and allow 72 hours for refills to be completed.        To help prevent nausea and vomiting after your treatment, we encourage you to take your nausea medication as directed.  BELOW ARE SYMPTOMS THAT SHOULD BE REPORTED IMMEDIATELY: *FEVER GREATER THAN 100.4 F (38 C) OR HIGHER *CHILLS OR SWEATING *NAUSEA AND VOMITING THAT IS NOT CONTROLLED WITH YOUR NAUSEA MEDICATION *UNUSUAL SHORTNESS OF BREATH *UNUSUAL BRUISING OR BLEEDING *URINARY PROBLEMS (pain or burning when urinating, or frequent urination) *BOWEL PROBLEMS (unusual diarrhea, constipation, pain near the anus) TENDERNESS IN MOUTH AND THROAT WITH OR WITHOUT PRESENCE OF ULCERS (sore throat, sores in mouth, or a toothache) UNUSUAL RASH, SWELLING OR PAIN  UNUSUAL VAGINAL DISCHARGE OR ITCHING   Items with * indicate a potential emergency and should be followed up as soon as possible or go to the Emergency Department if any problems should occur.  Please show the CHEMOTHERAPY ALERT CARD or IMMUNOTHERAPY ALERT CARD at check-in to the Emergency Department and triage nurse.  Should you have questions after your visit or need to cancel  or reschedule your appointment, please contact [facility] [phone]  and follow the prompts.  Office hours are 8:00 a.m. to 4:30 p.m. Monday - Friday. Please note that voicemails left after 4:00 p.m. may not be returned until the following business day.  We are closed weekends and major holidays. You have access to a nurse at all times for urgent questions. Please call the main number to the clinic [phone] and follow the prompts.  For any non-urgent questions, you may also contact your provider using MyChart. We now offer e-Visits for anyone 18 and older to request care online for non-urgent symptoms. For details visit [email or web address].   Also download the MyChart app! Go to the app store, search "MyChart", open the app, select [facility], and log in with your MyChart username and password.  Due to Covid, a mask is required upon entering the hospital/clinic. If you do not have a mask, one will be given to you upon arrival. For doctor visits, patients may have 1 support person aged 18 or older with them. For treatment visits, patients cannot have anyone with them due to current Covid guidelines and our immunocompromised population.  

## 2021-06-29 LAB — LIPID PANEL
Chol/HDL Ratio: 2.1 ratio (ref 0.0–4.4)
Cholesterol, Total: 103 mg/dL (ref 100–199)
HDL: 48 mg/dL (ref >39)
LDL Chol Calc (NIH): 42 mg/dL (ref 0–99)
Triglycerides: 55 mg/dL (ref 0–149)
VLDL Cholesterol Cal: 13 mg/dL (ref 5–40)

## 2021-06-29 LAB — HEPATIC FUNCTION PANEL
ALT: 34 IU/L — ABNORMAL HIGH (ref 0–32)
AST: 59 IU/L — ABNORMAL HIGH (ref 0–40)
Albumin: 3.1 g/dL — ABNORMAL LOW (ref 3.7–4.7)
Alkaline Phosphatase: 128 IU/L — ABNORMAL HIGH (ref 44–121)
Bilirubin Total: 2.8 mg/dL — ABNORMAL HIGH (ref 0.0–1.2)
Bilirubin, Direct: 0.55 mg/dL — ABNORMAL HIGH (ref 0.00–0.40)
Total Protein: 5.1 g/dL — ABNORMAL LOW (ref 6.0–8.5)

## 2021-06-30 MED ORDER — ATORVASTATIN CALCIUM 20 MG PO TABS: 20.0000 mg | ORAL_TABLET | Freq: Every day | ORAL | 3 refills | Status: DC

## 2021-06-30 NOTE — Progress Notes (Signed)
Called patient and reviewed results with her. Pt. Endorses understanding of lowering medication dose and requests that a new prescription be sent in. Will order new prescription and forward results to pt.s PCP!   "Please contact Stacey Summers and let her know that her lab work is been reviewed.  Her AST and ALT continue to be elevated which is related to her cirrhosis.  Her cholesterol is very well controlled.  We will reduce her atorvastatin to 20 Milligrams daily.  Please forward these results to her PCP.  No further testing is needed at this time."

## 2021-06-30 NOTE — Addendum Note (Signed)
Addended by: Gerald Stabs on: 06/30/2021 11:09 AM   Modules accepted: Orders

## 2021-07-10 ENCOUNTER — Other Ambulatory Visit: Payer: Self-pay | Admitting: Family Medicine

## 2021-07-20 DIAGNOSIS — I8500 Esophageal varices without bleeding: Secondary | ICD-10-CM | POA: Diagnosis not present

## 2021-07-20 DIAGNOSIS — K7581 Nonalcoholic steatohepatitis (NASH): Secondary | ICD-10-CM | POA: Diagnosis not present

## 2021-07-20 DIAGNOSIS — K7460 Unspecified cirrhosis of liver: Secondary | ICD-10-CM | POA: Diagnosis not present

## 2021-07-20 DIAGNOSIS — K7682 Hepatic encephalopathy: Secondary | ICD-10-CM | POA: Diagnosis not present

## 2021-07-24 ENCOUNTER — Other Ambulatory Visit: Payer: Self-pay | Admitting: Family Medicine

## 2021-07-24 DIAGNOSIS — R600 Localized edema: Secondary | ICD-10-CM

## 2021-07-27 ENCOUNTER — Other Ambulatory Visit: Payer: Self-pay | Admitting: Family Medicine

## 2021-07-27 DIAGNOSIS — E11620 Type 2 diabetes mellitus with diabetic dermatitis: Secondary | ICD-10-CM

## 2021-07-27 NOTE — Telephone Encounter (Signed)
This was discontinued back in August.  Is she seeing high BGs?

## 2021-07-28 ENCOUNTER — Encounter (HOSPITAL_COMMUNITY): Payer: Self-pay

## 2021-07-28 ENCOUNTER — Inpatient Hospital Stay (HOSPITAL_COMMUNITY): Payer: PPO | Attending: Hematology

## 2021-07-28 ENCOUNTER — Ambulatory Visit (HOSPITAL_COMMUNITY)
Admission: RE | Admit: 2021-07-28 | Discharge: 2021-07-28 | Disposition: A | Discharge status: Home / Self Care | Payer: PPO | Source: Ambulatory Visit | Attending: Physician Assistant | Admitting: Physician Assistant

## 2021-07-28 ENCOUNTER — Inpatient Hospital Stay (HOSPITAL_COMMUNITY): Payer: PPO

## 2021-07-28 ENCOUNTER — Other Ambulatory Visit: Payer: Self-pay

## 2021-07-28 VITALS — BP 131/64 | HR 58 | Temp 97.8°F | Resp 18

## 2021-07-28 DIAGNOSIS — D696 Thrombocytopenia, unspecified: Secondary | ICD-10-CM | POA: Diagnosis not present

## 2021-07-28 DIAGNOSIS — M7989 Other specified soft tissue disorders: Secondary | ICD-10-CM | POA: Diagnosis not present

## 2021-07-28 DIAGNOSIS — E538 Deficiency of other specified B group vitamins: Secondary | ICD-10-CM | POA: Diagnosis not present

## 2021-07-28 DIAGNOSIS — Z87891 Personal history of nicotine dependence: Secondary | ICD-10-CM | POA: Diagnosis not present

## 2021-07-28 DIAGNOSIS — Z806 Family history of leukemia: Secondary | ICD-10-CM | POA: Insufficient documentation

## 2021-07-28 DIAGNOSIS — K7460 Unspecified cirrhosis of liver: Secondary | ICD-10-CM | POA: Diagnosis not present

## 2021-07-28 DIAGNOSIS — E785 Hyperlipidemia, unspecified: Secondary | ICD-10-CM | POA: Insufficient documentation

## 2021-07-28 DIAGNOSIS — M2550 Pain in unspecified joint: Secondary | ICD-10-CM | POA: Diagnosis not present

## 2021-07-28 DIAGNOSIS — Z79899 Other long term (current) drug therapy: Secondary | ICD-10-CM | POA: Diagnosis not present

## 2021-07-28 DIAGNOSIS — R911 Solitary pulmonary nodule: Secondary | ICD-10-CM | POA: Insufficient documentation

## 2021-07-28 DIAGNOSIS — I700 Atherosclerosis of aorta: Secondary | ICD-10-CM | POA: Diagnosis not present

## 2021-07-28 DIAGNOSIS — R5383 Other fatigue: Secondary | ICD-10-CM | POA: Diagnosis not present

## 2021-07-28 DIAGNOSIS — Z86010 Personal history of colonic polyps: Secondary | ICD-10-CM | POA: Insufficient documentation

## 2021-07-28 DIAGNOSIS — I10 Essential (primary) hypertension: Secondary | ICD-10-CM | POA: Insufficient documentation

## 2021-07-28 DIAGNOSIS — Z8269 Family history of other diseases of the musculoskeletal system and connective tissue: Secondary | ICD-10-CM | POA: Insufficient documentation

## 2021-07-28 DIAGNOSIS — Z8042 Family history of malignant neoplasm of prostate: Secondary | ICD-10-CM | POA: Diagnosis not present

## 2021-07-28 DIAGNOSIS — E119 Type 2 diabetes mellitus without complications: Secondary | ICD-10-CM | POA: Diagnosis not present

## 2021-07-28 DIAGNOSIS — F419 Anxiety disorder, unspecified: Secondary | ICD-10-CM | POA: Insufficient documentation

## 2021-07-28 DIAGNOSIS — E669 Obesity, unspecified: Secondary | ICD-10-CM | POA: Diagnosis not present

## 2021-07-28 DIAGNOSIS — Z9049 Acquired absence of other specified parts of digestive tract: Secondary | ICD-10-CM | POA: Insufficient documentation

## 2021-07-28 DIAGNOSIS — Z800 Family history of malignant neoplasm of digestive organs: Secondary | ICD-10-CM | POA: Insufficient documentation

## 2021-07-28 DIAGNOSIS — R001 Bradycardia, unspecified: Secondary | ICD-10-CM | POA: Diagnosis not present

## 2021-07-28 DIAGNOSIS — Z8371 Family history of colonic polyps: Secondary | ICD-10-CM | POA: Diagnosis not present

## 2021-07-28 DIAGNOSIS — Z8249 Family history of ischemic heart disease and other diseases of the circulatory system: Secondary | ICD-10-CM | POA: Diagnosis not present

## 2021-07-28 DIAGNOSIS — Z8051 Family history of malignant neoplasm of kidney: Secondary | ICD-10-CM | POA: Insufficient documentation

## 2021-07-28 LAB — CBC WITH DIFFERENTIAL/PLATELET
Abs Immature Granulocytes: 0.01 10*3/uL (ref 0.00–0.07)
Basophils Absolute: 0 10*3/uL (ref 0.0–0.1)
Basophils Relative: 1 %
Eosinophils Absolute: 0.2 10*3/uL (ref 0.0–0.5)
Eosinophils Relative: 4 %
HCT: 39.2 % (ref 36.0–46.0)
Hemoglobin: 13.2 g/dL (ref 12.0–15.0)
Immature Granulocytes: 0 %
Lymphocytes Relative: 33 %
Lymphs Abs: 1.7 10*3/uL (ref 0.7–4.0)
MCH: 33.9 pg (ref 26.0–34.0)
MCHC: 33.7 g/dL (ref 30.0–36.0)
MCV: 100.8 fL — ABNORMAL HIGH (ref 80.0–100.0)
Monocytes Absolute: 0.5 10*3/uL (ref 0.1–1.0)
Monocytes Relative: 9 %
Neutro Abs: 2.8 10*3/uL (ref 1.7–7.7)
Neutrophils Relative %: 53 %
RBC: 3.89 MIL/uL (ref 3.87–5.11)
RDW: 13.9 % (ref 11.5–15.5)
Smear Review: UNDETERMINED
WBC: 5.2 10*3/uL (ref 4.0–10.5)
nRBC: 0 % (ref 0.0–0.2)

## 2021-07-28 LAB — COMPREHENSIVE METABOLIC PANEL
ALT: 28 U/L (ref 0–44)
AST: 48 U/L — ABNORMAL HIGH (ref 15–41)
Albumin: 2.9 g/dL — ABNORMAL LOW (ref 3.5–5.0)
Alkaline Phosphatase: 96 U/L (ref 38–126)
Anion gap: 7 (ref 5–15)
BUN: 27 mg/dL — ABNORMAL HIGH (ref 8–23)
CO2: 28 mmol/L (ref 22–32)
Calcium: 8.6 mg/dL — ABNORMAL LOW (ref 8.9–10.3)
Chloride: 104 mmol/L (ref 98–111)
Creatinine, Ser: 0.8 mg/dL (ref 0.44–1.00)
GFR, Estimated: >60 mL/min (ref >60)
Glucose, Bld: 155 mg/dL — ABNORMAL HIGH (ref 70–99)
Potassium: 3.6 mmol/L (ref 3.5–5.1)
Sodium: 139 mmol/L (ref 135–145)
Total Bilirubin: 3 mg/dL — ABNORMAL HIGH (ref 0.3–1.2)
Total Protein: 5.5 g/dL — ABNORMAL LOW (ref 6.5–8.1)

## 2021-07-28 LAB — IRON AND TIBC
Iron: 155 ug/dL (ref 28–170)
Saturation Ratios: 44 % — ABNORMAL HIGH (ref 10.4–31.8)
TIBC: 351 ug/dL (ref 250–450)
UIBC: 196 ug/dL

## 2021-07-28 LAB — VITAMIN B12: Vitamin B-12: 580 pg/mL (ref 180–914)

## 2021-07-28 LAB — FERRITIN: Ferritin: 35 ng/mL (ref 11–307)

## 2021-07-28 MED ORDER — CYANOCOBALAMIN 1000 MCG/ML IJ SOLN
1000.0000 ug | Freq: Once | INTRAMUSCULAR | Status: AC
  Administered 2021-07-28: 11:00:00 1000 ug via INTRAMUSCULAR
  Filled 2021-07-28: qty 1

## 2021-07-28 NOTE — Progress Notes (Signed)
Patient tolerated injection with no complaints voiced.  Site clean and dry with no bruising or swelling noted at site.  See MAR for details.  Band aid applied.  Patient stable during and after injection.  Vss with discharge and left in satisfactory condition with no s/s of distress noted.

## 2021-07-28 NOTE — Telephone Encounter (Signed)
lmtcb

## 2021-07-28 NOTE — Patient Instructions (Signed)
[facility]  Discharge Instructions: Thank you for choosing [facility] to provide your oncology and hematology care.  If you have a lab appointment with the Cancer Center, please come in thru the Main Entrance and check in at the main information desk.  Wear comfortable clothing and clothing appropriate for easy access to any Portacath or PICC line.   We strive to give you quality time with your provider. You may need to reschedule your appointment if you arrive late (15 or more minutes).  Arriving late affects you and other patients whose appointments are after yours.  Also, if you miss three or more appointments without notifying the office, you may be dismissed from the clinic at the provider's discretion.      For prescription refill requests, have your pharmacy contact our office and allow 72 hours for refills to be completed.        To help prevent nausea and vomiting after your treatment, we encourage you to take your nausea medication as directed.  BELOW ARE SYMPTOMS THAT SHOULD BE REPORTED IMMEDIATELY: *FEVER GREATER THAN 100.4 F (38 C) OR HIGHER *CHILLS OR SWEATING *NAUSEA AND VOMITING THAT IS NOT CONTROLLED WITH YOUR NAUSEA MEDICATION *UNUSUAL SHORTNESS OF BREATH *UNUSUAL BRUISING OR BLEEDING *URINARY PROBLEMS (pain or burning when urinating, or frequent urination) *BOWEL PROBLEMS (unusual diarrhea, constipation, pain near the anus) TENDERNESS IN MOUTH AND THROAT WITH OR WITHOUT PRESENCE OF ULCERS (sore throat, sores in mouth, or a toothache) UNUSUAL RASH, SWELLING OR PAIN  UNUSUAL VAGINAL DISCHARGE OR ITCHING   Items with * indicate a potential emergency and should be followed up as soon as possible or go to the Emergency Department if any problems should occur.  Please show the CHEMOTHERAPY ALERT CARD or IMMUNOTHERAPY ALERT CARD at check-in to the Emergency Department and triage nurse.  Should you have questions after your visit or need to cancel  or reschedule your appointment, please contact [facility] [phone]  and follow the prompts.  Office hours are 8:00 a.m. to 4:30 p.m. Monday - Friday. Please note that voicemails left after 4:00 p.m. may not be returned until the following business day.  We are closed weekends and major holidays. You have access to a nurse at all times for urgent questions. Please call the main number to the clinic [phone] and follow the prompts.  For any non-urgent questions, you may also contact your provider using MyChart. We now offer e-Visits for anyone 18 and older to request care online for non-urgent symptoms. For details visit [email or web address].   Also download the MyChart app! Go to the app store, search "MyChart", open the app, select [facility], and log in with your MyChart username and password.  Due to Covid, a mask is required upon entering the hospital/clinic. If you do not have a mask, one will be given to you upon arrival. For doctor visits, patients may have 1 support person aged 18 or older with them. For treatment visits, patients cannot have anyone with them due to current Covid guidelines and our immunocompromised population.  

## 2021-07-29 ENCOUNTER — Encounter: Payer: Self-pay | Admitting: Gastroenterology

## 2021-07-29 ENCOUNTER — Other Ambulatory Visit (HOSPITAL_COMMUNITY): Payer: Self-pay | Admitting: Physician Assistant

## 2021-07-29 DIAGNOSIS — D692 Other nonthrombocytopenic purpura: Secondary | ICD-10-CM | POA: Diagnosis not present

## 2021-07-29 DIAGNOSIS — R002 Palpitations: Secondary | ICD-10-CM | POA: Diagnosis not present

## 2021-07-29 DIAGNOSIS — Z7984 Long term (current) use of oral hypoglycemic drugs: Secondary | ICD-10-CM | POA: Diagnosis not present

## 2021-07-29 DIAGNOSIS — E785 Hyperlipidemia, unspecified: Secondary | ICD-10-CM | POA: Diagnosis not present

## 2021-07-29 DIAGNOSIS — E662 Morbid (severe) obesity with alveolar hypoventilation: Secondary | ICD-10-CM | POA: Diagnosis not present

## 2021-07-29 DIAGNOSIS — Z6841 Body Mass Index (BMI) 40.0 and over, adult: Secondary | ICD-10-CM | POA: Diagnosis not present

## 2021-07-29 DIAGNOSIS — K7290 Hepatic failure, unspecified without coma: Secondary | ICD-10-CM | POA: Diagnosis not present

## 2021-07-29 DIAGNOSIS — I10 Essential (primary) hypertension: Secondary | ICD-10-CM | POA: Diagnosis not present

## 2021-07-29 DIAGNOSIS — E1169 Type 2 diabetes mellitus with other specified complication: Secondary | ICD-10-CM | POA: Diagnosis not present

## 2021-07-29 DIAGNOSIS — D696 Thrombocytopenia, unspecified: Secondary | ICD-10-CM

## 2021-07-29 NOTE — Telephone Encounter (Signed)
Pt r/c.

## 2021-07-30 NOTE — Telephone Encounter (Signed)
Patient called back and states she was taking off her diabetic medication, BS has been 200 one day and 300 another day but she had ate some sweets. She took one of the pills that she was taken off for the high blood sugar.

## 2021-07-30 NOTE — Telephone Encounter (Signed)
Left message to call us back and lets [place] know if her sugars have been high? If so how High?

## 2021-07-31 LAB — METHYLMALONIC ACID, SERUM: Methylmalonic Acid, Quantitative: 463 nmol/L — ABNORMAL HIGH (ref 0–378)

## 2021-08-02 ENCOUNTER — Inpatient Hospital Stay (HOSPITAL_BASED_OUTPATIENT_CLINIC_OR_DEPARTMENT_OTHER): Payer: PPO | Admitting: Physician Assistant

## 2021-08-02 ENCOUNTER — Other Ambulatory Visit: Payer: Self-pay

## 2021-08-02 ENCOUNTER — Inpatient Hospital Stay (HOSPITAL_COMMUNITY): Payer: PPO

## 2021-08-02 VITALS — BP 133/54 | HR 54 | Temp 98.3°F | Resp 18 | Wt 228.5 lb

## 2021-08-02 DIAGNOSIS — D696 Thrombocytopenia, unspecified: Secondary | ICD-10-CM | POA: Diagnosis not present

## 2021-08-02 DIAGNOSIS — R911 Solitary pulmonary nodule: Secondary | ICD-10-CM

## 2021-08-02 DIAGNOSIS — E538 Deficiency of other specified B group vitamins: Secondary | ICD-10-CM

## 2021-08-02 DIAGNOSIS — E611 Iron deficiency: Secondary | ICD-10-CM | POA: Diagnosis not present

## 2021-08-02 LAB — CBC WITH DIFFERENTIAL/PLATELET
Abs Immature Granulocytes: 0.01 10*3/uL (ref 0.00–0.07)
Basophils Absolute: 0 10*3/uL (ref 0.0–0.1)
Basophils Relative: 1 %
Eosinophils Absolute: 0.2 10*3/uL (ref 0.0–0.5)
Eosinophils Relative: 4 %
HCT: 40.6 % (ref 36.0–46.0)
Hemoglobin: 13.3 g/dL (ref 12.0–15.0)
Immature Granulocytes: 0 %
Lymphocytes Relative: 34 %
Lymphs Abs: 1.7 10*3/uL (ref 0.7–4.0)
MCH: 33.7 pg (ref 26.0–34.0)
MCHC: 32.8 g/dL (ref 30.0–36.0)
MCV: 102.8 fL — ABNORMAL HIGH (ref 80.0–100.0)
Monocytes Absolute: 0.4 10*3/uL (ref 0.1–1.0)
Monocytes Relative: 9 %
Neutro Abs: 2.7 10*3/uL (ref 1.7–7.7)
Neutrophils Relative %: 52 %
Platelets: 76 10*3/uL — ABNORMAL LOW (ref 150–400)
RBC: 3.95 MIL/uL (ref 3.87–5.11)
RDW: 13.8 % (ref 11.5–15.5)
WBC: 5 10*3/uL (ref 4.0–10.5)
nRBC: 0 % (ref 0.0–0.2)

## 2021-08-02 MED ORDER — FERROUS SULFATE 325 (65 FE) MG PO TBEC: 325.0000 mg | DELAYED_RELEASE_TABLET | Freq: Every day | ORAL | 0 refills | Status: DC

## 2021-08-02 NOTE — Progress Notes (Signed)
° °[facility] °[address] °[place], [place] [address] ° ° °CLINIC:  °Medical Oncology/Hematology ° °PCP:  °Gottschalk, Ashly M, DO °[address] °[place] [address] °[phone] ° ° °REASON FOR VISIT:  °Follow-up for thrombocytopenia ° °PRIOR THERAPY: None ° °CURRENT THERAPY: Monthly B12 injections ° °INTERVAL HISTORY:  °Stacey Summers 75 y.o. female returns for routine follow-up of her thrombocytopenia, B12 deficiency, and lung nodules.  She was last seen by Rebekah Pennington PA-C on 03/31/2021. ° °At today's visit, she reports feeling fair.  No recent hospitalizations, surgeries, or changes in baseline health status. ° °She denies noticing any bleeding events such as hematemesis, hematochezia, epistaxis, melena, or hematuria.  She does have some mild easy bruising on her extremities, but denies any petechial rash.  No B symptoms such as fever, chills, night sweats, unintentional weight loss. ° °She continues to have chronic fatigue. °She has 40% energy and 60% appetite. She endorses that she is maintaining a stable weight. ° ° °REVIEW OF SYSTEMS:  °Review of Systems  °Constitutional:  Positive for fatigue. Negative for appetite change, chills, diaphoresis, fever and unexpected weight change.  °HENT:   Positive for trouble swallowing. Negative for lump/mass and nosebleeds.   °Eyes:  Negative for eye problems.  °Respiratory:  Negative for cough, hemoptysis and shortness of breath.   °Cardiovascular:  Positive for leg swelling (chronic) and palpitations. Negative for chest pain.  °Gastrointestinal:  Negative for abdominal pain, blood in stool, constipation, diarrhea, nausea and vomiting.  °Genitourinary:  Negative for hematuria.   °Musculoskeletal:  Positive for arthralgias.  °Skin: Negative.   °Neurological:  Negative for dizziness, headaches and light-headedness.  °Hematological:  Does not bruise/bleed easily.  °Psychiatric/Behavioral:  The patient is nervous/anxious.    ° ° °PAST MEDICAL/SURGICAL  HISTORY:  °Past Medical History:  °Diagnosis Date  ° Allergy   ° seasonal  ° Anxiety   ° Arthritis   ° hands, back   ° B12 deficiency 03/31/2021  ° Cataract   ° surgery to remove  ° Chronic back pain   ° lower back   ° Cirrhosis of liver without ascites (HCC)   ° recent dx 07/11/18  ° COPD (chronic obstructive pulmonary disease) (HCC)   ° no inhaler  ° Diabetes mellitus without complication (HCC)   ° DJD (degenerative joint disease)   ° Fibromyalgia   ° Genetic testing 03/08/2017  ° Ms. Egolf underwent genetic counseling and testing for hereditary cancer syndromes on 02/21/2017. Her results were negative for mutations in all 83 genes analyzed by [facility]'s 83-gene Multi-Cancers Panel. 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  ° History of abnormal mammogram   ° Hyperlipidemia   ° Hypertension   ° Irregular heart beat   ° Neuromuscular disorder (HCC)   ° fibromyalgia  ° Sleep apnea   ° cpap some  ° SVD (spontaneous vaginal delivery)   ° x 2  ° Tubular adenoma of colon 05/09/12  ° "innumerable number" of flat polyps  ° °Past Surgical History:  °Procedure Laterality Date  ° CARPAL TUNNEL RELEASE Left 10/20/14  ° [facility]  ° CATARACT EXTRACTION, BILATERAL    ° CESAREAN SECTION  1977  ° CHOLECYSTECTOMY  1990  ° COLONOSCOPY    ° IR RADIOLOGIST EVAL &   EVAL & MGMT  07/16/2019   KNEE ARTHROSCOPY WITH SUBCHONDROPLASTY Right 03/27/2018   Procedure: Right knee arthroscopic partial medial meniscus repair versus menisectomy with medial tibia subchondroplasty;  Surgeon: Nicholes Stairs, MD;  Location: [facility];  Service: Orthopedics;  Laterality: Right;  120 mins   LARYNX SURGERY     POLYP REMOVAL   MULTIPLE TOOTH EXTRACTIONS     POLYPECTOMY     vocal cord   TONSILLECTOMY  1965   TUBAL  LIGATION     TUMOR REMOVAL     benign fatty tumors on abdomen   UPPER GASTROINTESTINAL ENDOSCOPY       SOCIAL HISTORY:  Social History   Socioeconomic History   Marital status: Married    Spouse name: [name] "goes by [employer]"   Number of children: 3   Years of education: 11   Highest education level: GED or equivalent  Occupational History   Occupation: retired  Tobacco Use   Smoking status: Former    Packs/day: 1.00    Years: 3.00    Pack years: 3.00    Types: Cigarettes    Quit date: 08/15/1969    Years since quitting: 52.0   Smokeless tobacco: Never  Vaping Use   Vaping Use: Never used  Substance and Sexual Activity   Alcohol use: No    Alcohol/week: 0.0 standard drinks   Drug use: No   Sexual activity: Not Currently  Other Topics Concern   Not on file  Social History Narrative   Not on file   Social Determinants of Health   Financial Resource Strain: Low Risk    Difficulty of Paying Living Expenses: Not very hard  Food Insecurity: No Food Insecurity   Worried About Charity fundraiser in the Last Year: Never true   Ran Out of Food in the Last Year: Never true  Transportation Needs: No Transportation Needs   Lack of Transportation (Medical): No   Lack of Transportation (Non-Medical): No  Physical Activity: Inactive   Days of Exercise per Week: 0 days   Minutes of Exercise per Session: 0 min  Stress: No Stress Concern Present   Feeling of Stress : Only a little  Social Connections: Engineer, building services of Communication with Friends and Family: More than three times a week   Frequency of Social Gatherings with Friends and Family: More than three times a week   Attends Religious Services: More than 4 times per year   Active Member of [employer] or Organizations: Yes   Attends Music therapist: More than 4 times per year   Marital Status: Married  Human resources officer Violence: Not At Risk   Fear of Current or Ex-Partner: No   Emotionally  Abused: No   Physically Abused: No   Sexually Abused: No    FAMILY HISTORY:  Family History  Problem Relation Age of Onset   Prostate cancer Father        d.89 diagnosed in late-60s   Colon cancer Father        diagnosed at 65   Heart failure Mother        d.37   Heart disease Mother    Colon polyps Brother        polyposis. Underwent partial colectomy at age 58.   Spina bifida Brother        died at birth   Stomach cancer Paternal Aunt        questionable   Kidney cancer Cousin  d.78s maternal first-cousin  ° Other Other 16  °     multiple lipomas  ° Colon cancer Paternal Uncle   °     in his 80s  ° Heart failure Maternal Grandmother   °     d.45  ° Heart failure Maternal Uncle   °     d.50  ° Colon polyps Son 50  ° Heart attack Son 50  ° Leukemia Maternal Aunt   ° Esophageal cancer Neg Hx   ° Rectal cancer Neg Hx   ° ° °CURRENT MEDICATIONS:  °Outpatient Encounter Medications as of 08/02/2021  °Medication Sig  ° albuterol (VENTOLIN HFA) 108 (90 Base) MCG/ACT inhaler Inhale 2 puffs into the lungs every 6 (six) hours as needed for wheezing or shortness of breath.  ° amLODipine (NORVASC) 5 MG tablet amlodipine 5 mg tablet  ° atorvastatin (LIPITOR) 20 MG tablet Take 1 tablet (20 mg total) by mouth daily.  ° benazepril (LOTENSIN) 20 MG tablet Take 1 tablet by mouth once daily  ° Biotin 5000 MCG SUBL Place under the tongue.  ° blood glucose meter kit and supplies KIT Dispense based on patient and insurance preference. Use up to four times daily as directed. (FOR ICD-9 250.00, 250.01).  ° Blood Glucose Monitoring Suppl (ONETOUCH VERIO FLEX SYSTEM) w/Device KIT Check Bs up to 4 times daily Dx E11.9,  ° Cholecalciferol (VITAMIN D3) 5000 units CAPS Take 5,000 Units by mouth daily.  ° cyclobenzaprine (FLEXERIL) 5 MG tablet TAKE 1 TABLET BY MOUTH THREE TIMES DAILY AS NEEDED FOR MUSCLE SPASMS (NEEDS APPOINTMENT)  ° diltiazem (CARDIZEM CD) 120 MG 24 hr capsule Take 1 capsule (120 mg total) by mouth  daily.  ° fluticasone (FLONASE) 50 MCG/ACT nasal spray USE 1 SPRAY(S) IN EACH NOSTRIL IN THE MORNING AND AT BEDTIME  ° furosemide (LASIX) 40 MG tablet TAKE 1 TABLET BY MOUTH IN THE MORNING AND 1/2 (ONE-HALF) ONCE DAILY AT  1PM  ° glucose blood (ONETOUCH VERIO) test strip CHECK GLUCOSE UP TO 4 TIMES DAILY Dx E11.69  ° lactulose (CHRONULAC) 10 GM/15ML solution TAKE 15 ML BY MOUTH  THREE TIMES DAILY AS NEEDED  ° LORazepam (ATIVAN) 0.5 MG tablet Take 0.5-1 tablets (0.25-0.5 mg total) by mouth 2 (two) times daily as needed for anxiety.  ° meclizine (ANTIVERT) 25 MG tablet Take 1 tablet (25 mg total) by mouth 3 (three) times daily as needed for dizziness.  ° metFORMIN (GLUCOPHAGE) 500 MG tablet Take 1 tablet (500 mg total) by mouth daily with breakfast.  ° ondansetron (ZOFRAN) 4 MG tablet Take 1 tablet (4 mg total) by mouth every 8 (eight) hours as needed for nausea or vomiting.  ° ONETOUCH DELICA LANCETS 33G MISC USE ONE LANCET TO CHECK GLUCOSE ONCE DAILY AND AS NEEDED  ° rifaximin (XIFAXAN) 550 MG TABS tablet Take 1 tablet (550 mg total) by mouth 2 (two) times daily.  ° traMADol (ULTRAM) 50 MG tablet Take 1 tablet (50 mg total) by mouth every 12 (twelve) hours as needed for moderate pain.  ° °No facility-administered encounter medications on file as of 08/02/2021.  ° ° °ALLERGIES:  °Allergies  °Allergen Reactions  ° Latex   °  "my hands feels numb"  ° ° ° °PHYSICAL EXAM:  °ECOG PERFORMANCE STATUS: 2 - Symptomatic, <50% confined to bed ° °There were no vitals filed for this visit. °There were no vitals filed for this visit. °Physical Exam °Constitutional:   °   Appearance: Normal appearance. She is obese.  °  HENT:  °   Head: Normocephalic and atraumatic.  °   Mouth/Throat:  °   Mouth: Mucous membranes are moist.  °Eyes:  °   Extraocular Movements: Extraocular movements intact.  °   Pupils: Pupils are equal, round, and reactive to light.  °Cardiovascular:  °   Rate and Rhythm: Regular rhythm. Bradycardia present.  °    Pulses: Normal pulses.  °   Heart sounds: Murmur heard.  °Pulmonary:  °   Effort: Pulmonary effort is normal.  °   Breath sounds: Normal breath sounds.  °Abdominal:  °   General: Bowel sounds are normal.  °   Palpations: Abdomen is soft.  °   Tenderness: There is no abdominal tenderness.  °Musculoskeletal:     °   General: No swelling.  °   Right lower leg: Edema (2+) present.  °   Left lower leg: Edema (2+) present.  °Lymphadenopathy:  °   Cervical: No cervical adenopathy.  °Skin: °   General: Skin is warm and dry.  °Neurological:  °   General: No focal deficit present.  °   Mental Status: She is alert and oriented to person, place, and time.  °Psychiatric:     °   Mood and Affect: Mood normal.     °   Behavior: Behavior normal.  ° ° ° °LABORATORY DATA:  °I have reviewed the labs as listed.  °CBC °   °Component Value Date/Time  ° WBC 5.2 07/28/2021 1104  ° RBC 3.89 07/28/2021 1104  ° HGB 13.2 07/28/2021 1104  ° HGB 12.7 04/05/2021 1605  ° HCT 39.2 07/28/2021 1104  ° HCT 38.1 04/05/2021 1605  ° PLT DCLMP 07/28/2021 1104  ° PLT 78 (LL) 04/05/2021 1605  ° MCV 100.8 (H) 07/28/2021 1104  ° MCV 97 04/05/2021 1605  ° MCH 33.9 07/28/2021 1104  ° MCHC 33.7 07/28/2021 1104  ° RDW 13.9 07/28/2021 1104  ° RDW 12.1 04/05/2021 1605  ° LYMPHSABS 1.7 07/28/2021 1104  ° LYMPHSABS 1.7 04/05/2021 1605  ° MONOABS 0.5 07/28/2021 1104  ° EOSABS 0.2 07/28/2021 1104  ° EOSABS 0.2 04/05/2021 1605  ° BASOSABS 0.0 07/28/2021 1104  ° BASOSABS 0.0 04/05/2021 1605  ° °CMP Latest Ref Rng & Units 07/28/2021 06/28/2021 06/01/2021  °Glucose 70 - 99 mg/dL 155(H) - 142(H)  °BUN 8 - 23 mg/dL 27(H) - 22  °Creatinine 0.44 - 1.00 mg/dL 0.80 - 0.92  °Sodium 135 - 145 mmol/L 139 - 143  °Potassium 3.5 - 5.1 mmol/L 3.6 - 3.4(L)  °Chloride 98 - 111 mmol/L 104 - 100  °CO2 22 - 32 mmol/L 28 - 35(H)  °Calcium 8.9 - 10.3 mg/dL 8.6(L) - 9.1  °Total Protein 6.5 - 8.1 g/dL 5.5(L) 5.1(L) 6.1  °Total Bilirubin 0.3 - 1.2 mg/dL 3.0(H) 2.8(H) 3.3(H)  °Alkaline Phos 38 -  126 U/L 96 128(H) 109  °AST 15 - 41 U/L 48(H) 59(H) 54(H)  °ALT 0 - 44 U/L 28 34(H) 30  ° ° °DIAGNOSTIC IMAGING:  °I have independently reviewed the relevant imaging and discussed with the patient. ° °ASSESSMENT & PLAN: °1.  Thrombocytopenia: °- Platelet count is ranging anywhere between 70-140.  Most recent platelets (12/17/2020) at 100. °- CT of the AP on 06/27/2018 showed spleen is mildly enlarged.  She has cirrhosis of the liver. °-EGD on 08/20/2019 showed grade 1 varices, but patient denies any melena or bright red blood per rectum °- No signs or symptoms of bleeding at this time.  Admits   to easy bruising but denies petechial rash.     °- CBC today (08/02/2021): Platelets 76, at baseline °- No treatment indicated unless platelet count is consistently < 30,000 - if this occurs, splenic artery embolization could be considered °- PLAN: No treatment of thrombocytopenia needed at this time.  RTC for labs and follow-up visit in 6 months. ° °2.  Iron deficiency (without anemia) °- Iron panel (07/28/2021): Ferritin 35, iron saturation 44% °- No apparent anemia, with Hgb 13.2 on 07/28/2021 °- Patient denies any signs of GI blood loss °- Most recent EGD (08/20/2019) showed small esophageal varices and mild portal hypertensive gastropathy °- PLAN: Recommend trial of oral iron supplementation with ferrous sulfate 325 mg daily, taken in the separate from PPI along with a glass of orange juice.  We will recheck iron panel at follow-up visit in 6 months, and consider parenteral IV iron if no improvement. ° °3.  B12 deficiency °- B12 deficiency evidence by labs (03/26/2021) showing elevated methylmalonic acid 629, although B12 was normal at 336 °- Patient has been receiving monthly B12 injections °- Most recent labs (07/28/2021): Vitamin B12 580, methylmalonic acid 463 °- PLAN: Continue monthly B12 injections ° °4.  Lung nodules: °- She is a former smoker and has not smoked cigarettes since 1971. °- CT of the chest from 10/04/2019  which showed resolution of recent COVID-19 pneumonia.  Bilateral pulmonary nodules, less than 4 mm in size have been stable compared to previous CT chest 06/28/2019 °-Repeat CT chest (07/28/2021): Bilateral small subpleural nodules are stable and benign, largest nodule in the peripheral left lower lobe measures 3.5 mm, unchanged. °- PLAN: No further follow-up recommended per radiology report ° ° °PLAN SUMMARY & DISPOSITION: °Monthly B12 injections °Labs in 6 months °RTC after labs ° °All questions were answered. The patient knows to call the clinic with any problems, questions or concerns. ° °Medical decision making: Moderate ° °Time spent on visit: I spent 20 minutes counseling the patient face to face. The total time spent in the appointment was 30 minutes and more than 50% was on counseling. ° ° °Rebekah M Pennington, PA-C  °08/02/2021 3:16 PM ° ° °

## 2021-08-02 NOTE — Patient Instructions (Signed)
[place] at [facility] Discharge Instructions  You were seen today by Tarri Abernethy PA-C for your low platelets.  As we discussed, your low platelets are due to your underlying liver disease.  They remain low, but stable at their baseline.  You do not need any treatment for low platelets at this time.  You should continue to receive monthly B12 injections.  Your iron levels are slightly low, so I recommend that you start taking once daily over-the-counter iron supplementation (ferrous sulfate 325 mg).  This should be taken at a DIFFERENT time of day than your omeprazole (Prilosec).  You can also take your iron with a half a glass of orange juice, which will help your body to absorb it better.  Make sure that your iron is not causing any constipation, and that you are still having about 3 bowel movements per day with your lactulose.  LABS: Return in 6 months for repeat labs  FOLLOW-UP APPOINTMENT: Office visit in 6 months, after labs   Thank you for choosing [place] at [facility] to provide your oncology and hematology care.  To afford each patient quality time with our provider, please arrive at least 15 minutes before your scheduled appointment time.   If you have a lab appointment with the [facility] please come in thru the Main Entrance and check in at the main information desk.  You need to re-schedule your appointment should you arrive 10 or more minutes late.  We strive to give you quality time with our providers, and arriving late affects you and other patients whose appointments are after yours.  Also, if you no show three or more times for appointments you may be dismissed from the clinic at the providers discretion.     Again, thank you for choosing [facility].  Our hope is that these requests will decrease the amount of time that you wait before being seen by our physicians.        _____________________________________________________________  Should you have questions after your visit to [facility], please contact our office at [phone] and follow the prompts.  Our office hours are 8:00 a.m. and 4:30 p.m. Monday - Friday.  Please note that voicemails left after 4:00 p.m. may not be returned until the following business day.  We are closed weekends and major holidays.  You do have access to a nurse 24-7, just call the main number to the clinic [phone] and do not press any options, hold on the line and a nurse will answer the phone.    For prescription refill requests, have your pharmacy contact our office and allow 72 hours.    Due to Covid, you will need to wear a mask upon entering the hospital. If you do not have a mask, a mask will be given to you at the Main Entrance upon arrival. For doctor visits, patients may have 1 support person age 23 or older with them. For treatment visits, patients can not have anyone with them due to social distancing guidelines and our immunocompromised population.

## 2021-08-24 ENCOUNTER — Encounter (HOSPITAL_COMMUNITY): Payer: Self-pay | Admitting: Hematology

## 2021-08-25 ENCOUNTER — Ambulatory Visit (HOSPITAL_COMMUNITY): Payer: PPO

## 2021-08-25 ENCOUNTER — Encounter (HOSPITAL_COMMUNITY): Payer: Self-pay | Admitting: Hematology

## 2021-08-27 ENCOUNTER — Other Ambulatory Visit (INDEPENDENT_AMBULATORY_CARE_PROVIDER_SITE_OTHER): Payer: PPO

## 2021-08-27 ENCOUNTER — Encounter: Payer: Self-pay | Admitting: Gastroenterology

## 2021-08-27 ENCOUNTER — Other Ambulatory Visit: Payer: Self-pay | Admitting: Gastroenterology

## 2021-08-27 ENCOUNTER — Ambulatory Visit (INDEPENDENT_AMBULATORY_CARE_PROVIDER_SITE_OTHER): Payer: PPO | Admitting: Gastroenterology

## 2021-08-27 VITALS — BP 124/70 | HR 60 | Ht 60.75 in | Wt 228.2 lb

## 2021-08-27 DIAGNOSIS — K7581 Nonalcoholic steatohepatitis (NASH): Secondary | ICD-10-CM

## 2021-08-27 DIAGNOSIS — K7460 Unspecified cirrhosis of liver: Secondary | ICD-10-CM

## 2021-08-27 DIAGNOSIS — K7682 Hepatic encephalopathy: Secondary | ICD-10-CM

## 2021-08-27 LAB — COMPREHENSIVE METABOLIC PANEL
ALT: 30 U/L (ref 0–35)
AST: 47 U/L — ABNORMAL HIGH (ref 0–37)
Albumin: 3.2 g/dL — ABNORMAL LOW (ref 3.5–5.2)
Alkaline Phosphatase: 90 U/L (ref 39–117)
BUN: 27 mg/dL — ABNORMAL HIGH (ref 6–23)
CO2: 31 mEq/L (ref 19–32)
Calcium: 9 mg/dL (ref 8.4–10.5)
Chloride: 104 mEq/L (ref 96–112)
Creatinine, Ser: 0.93 mg/dL (ref 0.40–1.20)
GFR: 61.01 mL/min (ref >60.00)
Glucose, Bld: 138 mg/dL — ABNORMAL HIGH (ref 70–99)
Potassium: 4.2 mEq/L (ref 3.5–5.1)
Sodium: 143 mEq/L (ref 135–145)
Total Bilirubin: 3.4 mg/dL — ABNORMAL HIGH (ref 0.2–1.2)
Total Protein: 5.7 g/dL — ABNORMAL LOW (ref 6.0–8.3)

## 2021-08-27 LAB — CBC WITH DIFFERENTIAL/PLATELET
Basophils Absolute: 0 10*3/uL (ref 0.0–0.1)
Basophils Relative: 0.4 % (ref 0.0–3.0)
Eosinophils Absolute: 0.2 10*3/uL (ref 0.0–0.7)
Eosinophils Relative: 3.3 % (ref 0.0–5.0)
HCT: 41.7 % (ref 36.0–46.0)
Hemoglobin: 14 g/dL (ref 12.0–15.0)
Lymphocytes Relative: 33.3 % (ref 12.0–46.0)
Lymphs Abs: 1.8 10*3/uL (ref 0.7–4.0)
MCHC: 33.6 g/dL (ref 30.0–36.0)
MCV: 99 fl (ref 78.0–100.0)
Monocytes Absolute: 0.6 10*3/uL (ref 0.1–1.0)
Monocytes Relative: 10.4 % (ref 3.0–12.0)
Neutro Abs: 2.9 10*3/uL (ref 1.4–7.7)
Neutrophils Relative %: 52.6 % (ref 43.0–77.0)
Platelets: 77 10*3/uL — ABNORMAL LOW (ref 150.0–400.0)
RBC: 4.21 Mil/uL (ref 3.87–5.11)
RDW: 14.7 % (ref 11.5–15.5)
WBC: 5.4 10*3/uL (ref 4.0–10.5)

## 2021-08-27 LAB — PROTIME-INR
INR: 1.3 ratio — ABNORMAL HIGH (ref 0.8–1.0)
Prothrombin Time: 14.3 s — ABNORMAL HIGH (ref 9.6–13.1)

## 2021-08-27 NOTE — Patient Instructions (Signed)
You have been scheduled for an abdominal ultrasound at [facility] Radiology (1st floor of hospital) on _________ at __________. Please arrive 15 minutes prior to your appointment for registration. Make certain not to have anything to eat or drink 6 hours prior to your appointment. Should you need to reschedule your appointment, please contact radiology at [phone]. This test typically takes about 30 minutes to perform.   Your provider has requested that you go to the basement level for lab work before leaving today. Press "B" on the elevator. The lab is located at the first door on the left as you exit the elevator.   You will be contacted by [facility] in the next 2 days to arrange a ____________*.  The number on your caller ID will be [phone], please answer when they call.  If you have not heard from them in 2 days please call [phone] to schedule.     Your provider has requested that you go to the basement level for lab work before leaving today. Press "B" on the elevator. The lab is located at the first door on the left as you exit the elevator.   Due to recent changes in healthcare laws, you may see the results of your imaging and laboratory studies on MyChart before your provider has had a chance to review them.  We understand that in some cases there may be results that are confusing or concerning to you. Not all laboratory results come back in the same time frame and the provider may be waiting for multiple results in order to interpret others.  Please give [place] 48 hours in order for your provider to thoroughly review all the results before contacting the office for clarification of your results.    I appreciate the  opportunity to care for you  Thank You   Harl Bowie , MD

## 2021-08-27 NOTE — Progress Notes (Signed)
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   She was evaluated by [facility], please see full note in Care Everywhere for details  Overall she feels much better but there are days when she has decreased energy and continues to have days where she sleeps more during daytime. She continues to have intermittent episodes of confusion, mood changes and changes in sleep pattern.  She tends to sleep during daytime as she has difficulty falling asleep at night.  According to patient she sleeps during the day because she is bored and there is nothing else to do   Denies any worsening confusion, abdominal distention, melena or rectal bleeding. No vomiting, abdominal bloating or weight gain.    EGD 08/20/19: - Grade I and small (< 5 mm) esophageal varices. - No gross lesions in esophagus. - Portal hypertensive gastropathy. - Normal examined duodenum.   EGD 08/07/2018: - The Z-line was regular and was found 36 cm from the incisors. - No gross lesions were noted in the entire esophagus. No esophageal varices. - The stomach was normal. No gastric varices. - The examined duodenum was normal.   Colonoscopy 08/07/2018 - Four 4 to 9 mm polyps in the transverse colon and in the ascending colon, removed with a cold snare. Resected and retrieved. - Diverticulosis in the sigmoid colon. - Non-bleeding internal hemorrhoids.   Relevant history: History of COVID-19 infection November 2020.  Worsening mental status with confusion and somnolence post Covid.   She has prominent splenorenal shunt, was referred to IR to discuss possible  embolization.  But given she has significant improvement of symptoms with Xifaxan twice daily, family and patient preferred to hold off the embolization.     Outpatient Encounter Medications as of 08/27/2021  Medication Sig   albuterol (VENTOLIN HFA) 108 (90 Base) MCG/ACT inhaler Inhale 2 puffs into the lungs every 6 (six) hours as needed for wheezing or shortness of breath.   ALPRAZolam (XANAX) 0.25 MG tablet alprazolam 0.25 mg tablet   amLODipine (NORVASC) 5 MG tablet amlodipine 5 mg tablet   atorvastatin (LIPITOR) 20 MG tablet Take 1 tablet (20 mg total) by mouth daily.   benazepril (LOTENSIN) 20 MG tablet Take 1 tablet by mouth once daily   Biotin 5000 MCG SUBL Place under the tongue.   blood glucose meter kit and supplies KIT Dispense based on patient and insurance preference. Use up to four times daily as directed. (FOR ICD-9 250.00, 250.01).   Blood Glucose Monitoring Suppl (ONETOUCH VERIO FLEX SYSTEM) w/Device KIT Check Bs up to 4 times daily Dx E11.9,   Cholecalciferol (VITAMIN D3) 5000 units CAPS Take 5,000 Units by mouth daily.   cyclobenzaprine (FLEXERIL) 5 MG tablet TAKE 1 TABLET BY MOUTH THREE TIMES DAILY AS NEEDED FOR MUSCLE SPASMS (NEEDS APPOINTMENT)   diltiazem (CARDIZEM CD) 120 MG 24 hr capsule Take 1 capsule (120 mg total) by mouth daily.   ferrous sulfate 325 (65 FE) MG EC tablet Take 1 tablet (325 mg total) by mouth daily.   fluticasone (FLONASE) 50 MCG/ACT nasal spray USE 1 SPRAY(S) IN [facility]  NOSTRIL IN THE MORNING AND AT BEDTIME   furosemide (LASIX) 40 MG tablet TAKE 1 TABLET BY MOUTH IN THE MORNING AND 1/2 (ONE-HALF) ONCE DAILY AT  1PM   glucose blood (ONETOUCH VERIO) test strip CHECK GLUCOSE UP TO 4 TIMES DAILY Dx E11.69   lactulose (CHRONULAC) 10 GM/15ML solution TAKE 15 ML BY MOUTH  THREE TIMES DAILY AS NEEDED   LORazepam (ATIVAN) 0.5 MG tablet Take 0.5-1 tablets (0.25-0.5 mg total) by mouth 2 (two) times daily as needed for anxiety.   meclizine (ANTIVERT) 25 MG tablet  Take 1 tablet (25 mg total) by mouth 3 (three) times daily as needed for dizziness.   metFORMIN (GLUCOPHAGE) 500 MG tablet Take 1 tablet (500 mg total) by mouth daily with breakfast.   omeprazole (PRILOSEC) 40 MG capsule omeprazole 40 mg capsule,delayed release  TAKE 1 CAPSULE BY MOUTH ONCE DAILY   ondansetron (ZOFRAN) 4 MG tablet Take 1 tablet (4 mg total) by mouth every 8 (eight) hours as needed for nausea or vomiting.   ONETOUCH DELICA LANCETS 10F MISC USE ONE LANCET TO CHECK GLUCOSE ONCE DAILY AND AS NEEDED   rifaximin (XIFAXAN) 550 MG TABS tablet Take 1 tablet (550 mg total) by mouth 2 (two) times daily.   traMADol (ULTRAM) 50 MG tablet Take 1 tablet (50 mg total) by mouth every 12 (twelve) hours as needed for moderate pain.   valACYclovir (VALTREX) 1000 MG tablet valacyclovir 1 gram tablet   No facility-administered encounter medications on file as of 08/27/2021.    Allergies as of 08/27/2021 - Review Complete 08/27/2021  Allergen Reaction Noted   Latex  03/27/2018    Past Medical History:  Diagnosis Date   Allergy    seasonal   Anxiety    Arthritis    hands, back    B12 deficiency 03/31/2021   Cataract    surgery to remove   Chronic back pain    lower back    Cirrhosis of liver without ascites ([facility])    recent dx 07/11/18   COPD (chronic obstructive pulmonary disease) (HCC)    no inhaler   Diabetes mellitus without complication (HCC)    DJD (degenerative joint disease)    Fibromyalgia    Genetic testing 03/08/2017   Ms. Denbow underwent genetic counseling and testing for hereditary cancer syndromes on 02/21/2017. Her results were negative for mutations in all 83 genes analyzed by [facility]'s 83-gene Multi-Cancers Panel. Genes analyzed include: ALK, APC, ATM, AXIN2, BAP1, BARD1, BLM, BMPR1A, BRCA1, BRCA2, BRIP1, CASR, CDC73, CDH1, CDK4, CDKN1B, CDKN1C, CDKN2A, CEBPA, CHEK2, CTNNA1, DICER1, DIS3L2, EGFR, EPCAM   GERD (gastroesophageal reflux disease)    Hearing loss     bilateral - no hearing aids   History of abnormal mammogram    Hyperlipidemia    Hypertension    Irregular heart beat    Neuromuscular disorder (HCC)    fibromyalgia   Sleep apnea    cpap some   SVD (spontaneous vaginal delivery)    x 2   Tubular adenoma of colon 05/09/12   "innumerable number" of flat polyps    Past Surgical History:  Procedure Laterality Date   CARPAL TUNNEL RELEASE Left 10/20/14   [facility]   CATARACT EXTRACTION, BILATERAL     CESAREAN SECTION  1977   CHOLECYSTECTOMY  1990   COLONOSCOPY     IR RADIOLOGIST EVAL & MGMT  07/16/2019   KNEE ARTHROSCOPY WITH SUBCHONDROPLASTY Right 03/27/2018   Procedure: Right knee arthroscopic partial medial meniscus repair versus menisectomy  with medial tibia subchondroplasty;  Surgeon: Nicholes Stairs, MD;  Location: [facility];  Service: Orthopedics;  Laterality: Right;  120 mins   LARYNX SURGERY     POLYP REMOVAL   MULTIPLE TOOTH EXTRACTIONS     POLYPECTOMY     vocal cord   TONSILLECTOMY  1965   TUBAL LIGATION     TUMOR REMOVAL     benign fatty tumors on abdomen   UPPER GASTROINTESTINAL ENDOSCOPY      Family History  Problem Relation Age of Onset   Prostate cancer Father        d.89 diagnosed in late-60s   Colon cancer Father        diagnosed at 15   Heart failure Mother        d.37   Heart disease Mother    Colon polyps Brother        polyposis. Underwent partial colectomy at age 31.   Spina bifida Brother        died at birth   Stomach cancer Paternal Aunt        questionable   Kidney cancer Cousin        d.17s maternal first-cousin   Other Other 85       multiple lipomas   Colon cancer Paternal Uncle        in his 69s   Heart failure Maternal Grandmother        d.45   Heart failure Maternal Uncle        d.50   Colon polyps Son 50   Heart attack Son 74   Leukemia Maternal Aunt    Esophageal cancer Neg Hx    Rectal cancer Neg Hx     Social History   Socioeconomic History   Marital status:  Married    Spouse name: [name] "goes by [employer]"   Number of children: 3   Years of education: 11   Highest education level: GED or equivalent  Occupational History   Occupation: retired  Tobacco Use   Smoking status: Former    Packs/day: 1.00    Years: 3.00    Pack years: 3.00    Types: Cigarettes    Quit date: 08/15/1969    Years since quitting: 52.0   Smokeless tobacco: Never  Vaping Use   Vaping Use: Never used  Substance and Sexual Activity   Alcohol use: No    Alcohol/week: 0.0 standard drinks   Drug use: No   Sexual activity: Not Currently  Other Topics Concern   Not on file  Social History Narrative   Not on file   Social Determinants of Health   Financial Resource Strain: Low Risk    Difficulty of Paying Living Expenses: Not very hard  Food Insecurity: No Food Insecurity   Worried About Charity fundraiser in the Last Year: Never true   Ran Out of Food in the Last Year: Never true  Transportation Needs: No Transportation Needs   Lack of Transportation (Medical): No   Lack of Transportation (Non-Medical): No  Physical Activity: Inactive   Days of Exercise per Week: 0 days   Minutes of Exercise per Session: 0 min  Stress: No Stress Concern Present   Feeling of Stress : Only a little  Social Connections: Engineer, building services of Communication with Friends and Family: More than three times a week   Frequency of Social Gatherings with Friends and Family: More than three times a week   Attends Religious Services:  More than 4 times per year   Active Member of Clubs or Organizations: Yes   Attends Archivist Meetings: More than 4 times per year   Marital Status: Married  Human resources officer Violence: Not At Risk   Fear of Current or Ex-Partner: No   Emotionally Abused: No   Physically Abused: No   Sexually Abused: No      Review of systems: All other review of systems negative except as mentioned in the HPI.   Physical Exam: There were no  vitals filed for this visit. Body mass index is 43.48 kg/m. Gen:      No acute distress HEENT:  sclera anicteric Abd:      soft, non-tender; no palpable masses, no distension Ext:    No edema Neuro: alert and oriented x 3 Psych: normal mood and affect  Data Reviewed:  Reviewed labs, radiology imaging, old records and pertinent past GI work up   Assessment and Plan/Recommendations:  76 year old very pleasant with history of Stacey Summers cirrhosis decompensated with hepatic encephalopathy   Overall clinical status is stable MELD-Na score: 14    Chronic fatigue secondary to comorbid conditions including cirrhosis Vitamin D and B12 level within normal range   Hepatocellular screening: Due for ultrasound, will schedule it.  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  Do not recommend ongoing surveillance colonoscopy.  Her daughter was in agreement that her potential risk is significantly higher than benefit was not in complete agreement, she said if her liver function or clinical condition worsens she may be eligible for transplant sooner   Continue with low-carb and low-fat diet, avoid artificial sweeteners, high fructose corn syrup. Daily exercise as tolerated   Return in 3 months  This visit required >45 minutes of patient care (this includes precharting, chart review, review of results, face-to-face time used for counseling as well as  treatment plan and follow-up. The patient was provided an opportunity to ask questions and all were answered. The patient agreed with the plan and demonstrated an understanding of the instructions.  Damaris Hippo , MD    CC: Janora Norlander, DO

## 2021-08-30 ENCOUNTER — Other Ambulatory Visit: Payer: Self-pay

## 2021-08-30 ENCOUNTER — Inpatient Hospital Stay (HOSPITAL_COMMUNITY): Payer: PPO | Attending: Hematology

## 2021-08-30 VITALS — BP 146/50 | HR 50 | Temp 98.2°F | Resp 18

## 2021-08-30 DIAGNOSIS — E538 Deficiency of other specified B group vitamins: Secondary | ICD-10-CM | POA: Diagnosis not present

## 2021-08-30 MED ORDER — CYANOCOBALAMIN 1000 MCG/ML IJ SOLN
1000.0000 ug | Freq: Once | INTRAMUSCULAR | Status: AC
  Administered 2021-08-30: 1000 ug via INTRAMUSCULAR
  Filled 2021-08-30: qty 1

## 2021-08-30 NOTE — Progress Notes (Signed)
B12 injection given per orders. Patient tolerated it well without problems. Vitals stable and discharged home from clinic via wheelchair. Follow up as scheduled.  

## 2021-08-30 NOTE — Patient Instructions (Signed)
[facility]  Discharge Instructions: Thank you for choosing [facility] to provide your oncology and hematology care.  If you have a lab appointment with the [facility], please come in thru the Main Entrance and check in at the main information desk.    We strive to give you quality time with your provider. You may need to reschedule your appointment if you arrive late (15 or more minutes).  Arriving late affects you and other patients whose appointments are after yours.  Also, if you miss three or more appointments without notifying the office, you may be dismissed from the clinic at the providers discretion.      For prescription refill requests, have your pharmacy contact our office and allow 72 hours for refills to be completed.      To help prevent nausea and vomiting after your treatment, we encourage you to take your nausea medication as directed.  BELOW ARE SYMPTOMS THAT SHOULD BE REPORTED IMMEDIATELY: *FEVER GREATER THAN 100.4 F (38 C) OR HIGHER *CHILLS OR SWEATING *NAUSEA AND VOMITING THAT IS NOT CONTROLLED WITH YOUR NAUSEA MEDICATION *UNUSUAL SHORTNESS OF BREATH *UNUSUAL BRUISING OR BLEEDING *URINARY PROBLEMS (pain or burning when urinating, or frequent urination) *BOWEL PROBLEMS (unusual diarrhea, constipation, pain near the anus) TENDERNESS IN MOUTH AND THROAT WITH OR WITHOUT PRESENCE OF ULCERS (sore throat, sores in mouth, or a toothache) UNUSUAL RASH, SWELLING OR PAIN  UNUSUAL VAGINAL DISCHARGE OR ITCHING   Items with * indicate a potential emergency and should be followed up as soon as possible or go to the Emergency Department if any problems should occur.  Please show the CHEMOTHERAPY ALERT CARD or IMMUNOTHERAPY ALERT CARD at check-in to the Emergency Department and triage nurse.  Should you have questions after your visit or need to cancel or reschedule your appointment, please contact [facility] [phone]  and follow  the prompts.  Office hours are 8:00 a.m. to 4:30 p.m. Monday - Friday. Please note that voicemails left after 4:00 p.m. may not be returned until the following business day.  We are closed weekends and major holidays. You have access to a nurse at all times for urgent questions. Please call the main number to the clinic [phone] and follow the prompts.  For any non-urgent questions, you may also contact your provider using MyChart. We now offer e-Visits for anyone 6 and older to request care online for non-urgent symptoms. For details visit [email or web address].   Also download the MyChart app! Go to the app store, search "MyChart", open the app, select [facility], and log in with your MyChart username and password.  Due to Covid, a mask is required upon entering the hospital/clinic. If you do not have a mask, one will be given to you upon arrival. For doctor visits, patients may have 1 support person aged 31 or older with them. For treatment visits, patients cannot have anyone with them due to current Covid guidelines and our immunocompromised population.

## 2021-09-01 ENCOUNTER — Other Ambulatory Visit: Payer: Self-pay | Admitting: Family Medicine

## 2021-09-01 DIAGNOSIS — J301 Allergic rhinitis due to pollen: Secondary | ICD-10-CM

## 2021-09-06 ENCOUNTER — Other Ambulatory Visit: Payer: Self-pay

## 2021-09-06 ENCOUNTER — Ambulatory Visit (HOSPITAL_COMMUNITY)
Admission: RE | Admit: 2021-09-06 | Discharge: 2021-09-06 | Disposition: A | Discharge status: Home / Self Care | Payer: PPO | Source: Ambulatory Visit | Attending: Gastroenterology | Admitting: Gastroenterology

## 2021-09-06 DIAGNOSIS — K7682 Hepatic encephalopathy: Secondary | ICD-10-CM | POA: Insufficient documentation

## 2021-09-06 DIAGNOSIS — Z9049 Acquired absence of other specified parts of digestive tract: Secondary | ICD-10-CM | POA: Diagnosis not present

## 2021-09-06 DIAGNOSIS — K7460 Unspecified cirrhosis of liver: Secondary | ICD-10-CM | POA: Insufficient documentation

## 2021-09-06 DIAGNOSIS — K7581 Nonalcoholic steatohepatitis (NASH): Secondary | ICD-10-CM | POA: Insufficient documentation

## 2021-09-07 ENCOUNTER — Encounter: Payer: Self-pay | Admitting: Gastroenterology

## 2021-09-13 ENCOUNTER — Ambulatory Visit (INDEPENDENT_AMBULATORY_CARE_PROVIDER_SITE_OTHER): Payer: PPO | Admitting: Family Medicine

## 2021-09-13 ENCOUNTER — Telehealth: Payer: Self-pay | Admitting: Family Medicine

## 2021-09-13 ENCOUNTER — Encounter: Payer: Self-pay | Admitting: Family Medicine

## 2021-09-13 VITALS — BP 123/48 | HR 55 | Temp 97.2°F | Ht 60.76 in | Wt 224.4 lb

## 2021-09-13 DIAGNOSIS — R110 Nausea: Secondary | ICD-10-CM | POA: Diagnosis not present

## 2021-09-13 DIAGNOSIS — H66009 Acute suppurative otitis media without spontaneous rupture of ear drum, unspecified ear: Secondary | ICD-10-CM

## 2021-09-13 DIAGNOSIS — K7581 Nonalcoholic steatohepatitis (NASH): Secondary | ICD-10-CM

## 2021-09-13 DIAGNOSIS — K7460 Unspecified cirrhosis of liver: Secondary | ICD-10-CM

## 2021-09-13 MED ORDER — AMOXICILLIN-POT CLAVULANATE 875-125 MG PO TABS: 1.0000 | ORAL_TABLET | Freq: Two times a day (BID) | ORAL | 0 refills | Status: DC

## 2021-09-13 MED ORDER — ONDANSETRON 4 MG PO TBDP: 4.0000 mg | ORAL_TABLET | Freq: Four times a day (QID) | ORAL | 0 refills | Status: DC | PRN

## 2021-09-13 MED ORDER — PROMETHAZINE HCL 25 MG/ML IJ SOLN
12.5000 mg | Freq: Once | INTRAMUSCULAR | Status: AC
  Administered 2021-09-13: 12.5 mg via INTRAMUSCULAR

## 2021-09-13 NOTE — Telephone Encounter (Signed)
(  Key: [id number])Need help? Call us at [phone] [employer] The plan will fax you a determination, typically within 1 to 5 business days.  Drug Ondansetron 4MG dispersible tablets

## 2021-09-13 NOTE — Progress Notes (Signed)
Chief Complaint  Patient presents with   Otalgia   Sore Throat    HPI  Patient presents today for Patient presents with upper respiratory congestion. Rhinorrhea that is frequently purulent. There is moderate sore throat. LEft ear hurts. Patient reports coughing frequently as well.  No sputum noted. There is no fever, chills or sweats. The patient denies being short of breath. Onset was 3-5 days ago. Gradually worsening. Tried OTCs without improvement.  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  She sleeps a lot.  Daughter is here and gives much of the history and states that they are concerned about her immunity due to her cirrhosis.  She tends to have chronic abdominal discomfort as a result of the cirrhosis as well.  PMH: Smoking status noted ROS: Review of Systems  Constitutional:  Negative for appetite change, chills, diaphoresis, fatigue and fever.  HENT:  Positive for ear pain and rhinorrhea. Negative for congestion, hearing loss, postnasal drip, sore throat and trouble swallowing.   Respiratory:  Positive for cough. Negative for chest tightness and shortness of breath.   Cardiovascular:  Negative for chest pain.  Gastrointestinal:  Positive for abdominal pain.  Musculoskeletal:  Negative for arthralgias.  Skin:  Negative for rash.    Objective: BP (!) 123/48    Pulse (!) 55    Temp (!) 97.2 F (36.2 C)    Ht 5' 0.76" (1.543 m)    Wt 224 lb 6.4 oz (101.8 kg)    SpO2 94%    BMI 42.74 kg/m  Gen: NAD, alert, cooperative with exam HEENT: NCAT, Nasal passages swollen, red TMS RED CV: RRR, good S1/S2, no murmur Resp: CTA DDU:KGUR diffuse tenderness with some increase at RUQ. Ext: No edema, warm Neuro: Alert and oriented, No gross deficits  Assessment and plan:  1. Liver cirrhosis secondary to NASH ([facility])   2. Nausea   3. Acute suppurative otitis media without spontaneous rupture of ear drum, recurrence not specified, unspecified laterality     Meds ordered this  encounter  Medications   amoxicillin-clavulanate (AUGMENTIN) 875-125 MG tablet    Sig: Take 1 tablet by mouth 2 (two) times daily. Take all of this medication    Dispense:  20 tablet    Refill:  0   ondansetron (ZOFRAN-ODT) 4 MG disintegrating tablet    Sig: Take 1 tablet (4 mg total) by mouth every 6 (six) hours as needed for nausea or vomiting.    Dispense:  20 tablet    Refill:  0   promethazine (PHENERGAN) injection 12.5 mg    Orders Placed This Encounter  Procedures   Ammonia   CMP14+EGFR    Order Specific Question:   Has the patient fasted?    Answer:   Yes    Order Specific Question:   Release to patient    Answer:   Immediate   CBC with Differential/Platelet    Follow up as needed.  Claretta Fraise, MD

## 2021-09-14 LAB — CBC WITH DIFFERENTIAL/PLATELET
Basophils Absolute: 0.1 10*3/uL (ref 0.0–0.2)
Basos: 0 %
EOS (ABSOLUTE): 0.1 10*3/uL (ref 0.0–0.4)
Eos: 1 %
Hematocrit: 43 % (ref 34.0–46.6)
Hemoglobin: 14.7 g/dL (ref 11.1–15.9)
Immature Grans (Abs): 0.2 10*3/uL — ABNORMAL HIGH (ref 0.0–0.1)
Immature Granulocytes: 1 %
Lymphocytes Absolute: 1.3 10*3/uL (ref 0.7–3.1)
Lymphs: 6 %
MCH: 33.4 pg — ABNORMAL HIGH (ref 26.6–33.0)
MCHC: 34.2 g/dL (ref 31.5–35.7)
MCV: 98 fL — ABNORMAL HIGH (ref 79–97)
Monocytes Absolute: 1.7 10*3/uL — ABNORMAL HIGH (ref 0.1–0.9)
Monocytes: 8 %
Neutrophils Absolute: 17 10*3/uL — ABNORMAL HIGH (ref 1.4–7.0)
Neutrophils: 84 %
Platelets: 100 10*3/uL — CL (ref 150–450)
RBC: 4.4 x10E6/uL (ref 3.77–5.28)
RDW: 12.9 % (ref 11.7–15.4)
WBC: 20.3 10*3/uL (ref 3.4–10.8)

## 2021-09-14 LAB — CMP14+EGFR
ALT: 41 IU/L — ABNORMAL HIGH (ref 0–32)
AST: 62 IU/L — ABNORMAL HIGH (ref 0–40)
Albumin/Globulin Ratio: 1.3 (ref 1.2–2.2)
Albumin: 3.1 g/dL — ABNORMAL LOW (ref 3.7–4.7)
Alkaline Phosphatase: 111 IU/L (ref 44–121)
BUN/Creatinine Ratio: 28 (ref 12–28)
BUN: 34 mg/dL — ABNORMAL HIGH (ref 8–27)
Bilirubin Total: 3.1 mg/dL — ABNORMAL HIGH (ref 0.0–1.2)
CO2: 21 mmol/L (ref 20–29)
Calcium: 8.9 mg/dL (ref 8.7–10.3)
Chloride: 103 mmol/L (ref 96–106)
Creatinine, Ser: 1.22 mg/dL — ABNORMAL HIGH (ref 0.57–1.00)
Globulin, Total: 2.4 g/dL (ref 1.5–4.5)
Glucose: 193 mg/dL — ABNORMAL HIGH (ref 70–99)
Potassium: 4.5 mmol/L (ref 3.5–5.2)
Sodium: 141 mmol/L (ref 134–144)
Total Protein: 5.5 g/dL — ABNORMAL LOW (ref 6.0–8.5)
eGFR: 47 mL/min/{1.73_m2} — ABNORMAL LOW (ref >59)

## 2021-09-14 LAB — AMMONIA: Ammonia: 64 ug/dL (ref 31–169)

## 2021-09-14 NOTE — Telephone Encounter (Signed)
PA approved 09/13/21-08/14/22 pharmacy and patient aware.

## 2021-09-22 ENCOUNTER — Encounter (HOSPITAL_COMMUNITY): Payer: Self-pay

## 2021-09-22 ENCOUNTER — Inpatient Hospital Stay (HOSPITAL_COMMUNITY): Payer: PPO | Attending: Hematology

## 2021-09-22 ENCOUNTER — Telehealth: Payer: Self-pay | Admitting: Gastroenterology

## 2021-09-22 VITALS — BP 148/58 | HR 50 | Temp 98.8°F | Resp 18

## 2021-09-22 DIAGNOSIS — E538 Deficiency of other specified B group vitamins: Secondary | ICD-10-CM | POA: Insufficient documentation

## 2021-09-22 DIAGNOSIS — Z79899 Other long term (current) drug therapy: Secondary | ICD-10-CM | POA: Insufficient documentation

## 2021-09-22 MED ORDER — CYANOCOBALAMIN 1000 MCG/ML IJ SOLN
1000.0000 ug | Freq: Once | INTRAMUSCULAR | Status: AC
  Administered 2021-09-22: 1000 ug via INTRAMUSCULAR
  Filled 2021-09-22: qty 1

## 2021-09-22 NOTE — Patient Instructions (Signed)
[facility]  Discharge Instructions: Thank you for choosing [facility] to provide your oncology and hematology care.  If you have a lab appointment with the [facility], please come in thru the Main Entrance and check in at the main information desk.  Wear comfortable clothing and clothing appropriate for easy access to any Portacath or PICC line.   We strive to give you quality time with your provider. You may need to reschedule your appointment if you arrive late (15 or more minutes).  Arriving late affects you and other patients whose appointments are after yours.  Also, if you miss three or more appointments without notifying the office, you may be dismissed from the clinic at the providers discretion.      For prescription refill requests, have your pharmacy contact our office and allow 72 hours for refills to be completed.    Today you received the following B12 injection.       To help prevent nausea and vomiting after your treatment, we encourage you to take your nausea medication as directed.  BELOW ARE SYMPTOMS THAT SHOULD BE REPORTED IMMEDIATELY: *FEVER GREATER THAN 100.4 F (38 C) OR HIGHER *CHILLS OR SWEATING *NAUSEA AND VOMITING THAT IS NOT CONTROLLED WITH YOUR NAUSEA MEDICATION *UNUSUAL SHORTNESS OF BREATH *UNUSUAL BRUISING OR BLEEDING *URINARY PROBLEMS (pain or burning when urinating, or frequent urination) *BOWEL PROBLEMS (unusual diarrhea, constipation, pain near the anus) TENDERNESS IN MOUTH AND THROAT WITH OR WITHOUT PRESENCE OF ULCERS (sore throat, sores in mouth, or a toothache) UNUSUAL RASH, SWELLING OR PAIN  UNUSUAL VAGINAL DISCHARGE OR ITCHING   Items with * indicate a potential emergency and should be followed up as soon as possible or go to the Emergency Department if any problems should occur.  Please show the CHEMOTHERAPY ALERT CARD or IMMUNOTHERAPY ALERT CARD at check-in to the Emergency Department and triage nurse.  Should  you have questions after your visit or need to cancel or reschedule your appointment, please contact [facility] [phone]  and follow the prompts.  Office hours are 8:00 a.m. to 4:30 p.m. Monday - Friday. Please note that voicemails left after 4:00 p.m. may not be returned until the following business day.  We are closed weekends and major holidays. You have access to a nurse at all times for urgent questions. Please call the main number to the clinic [phone] and follow the prompts.  For any non-urgent questions, you may also contact your provider using MyChart. We now offer e-Visits for anyone 49 and older to request care online for non-urgent symptoms. For details visit [email or web address].   Also download the MyChart app! Go to the app store, search "MyChart", open the app, select [facility], and log in with your MyChart username and password.  Due to Covid, a mask is required upon entering the hospital/clinic. If you do not have a mask, one will be given to you upon arrival. For doctor visits, patients may have 1 support person aged 100 or older with them. For treatment visits, patients cannot have anyone with them due to current Covid guidelines and our immunocompromised population.

## 2021-09-22 NOTE — Progress Notes (Signed)
Stacey Summers presents today for injection per the provider's orders.  B12 injection administration without incident; injection site WNL; see MAR for injection details.  Patient tolerated procedure well and without incident.  No questions or complaints noted at this time.  Discharged from clinic by wheel chair accompanied by her daughter in stable condition. Alert and oriented x 3. F/U with [facility] as scheduled.

## 2021-09-22 NOTE — Telephone Encounter (Signed)
Inbound call from patients daughter, stated they have not heard anything on Xifixan. Daughter stated that normally every year there is an appeal that has to take place before patient can get medication. Seeking advice, please advise.

## 2021-09-23 NOTE — Telephone Encounter (Signed)
Will have to contact patient assistance program to see what is happening. They normally approve patient every year

## 2021-09-28 ENCOUNTER — Other Ambulatory Visit: Payer: Self-pay | Admitting: Gastroenterology

## 2021-09-30 DIAGNOSIS — I509 Heart failure, unspecified: Secondary | ICD-10-CM | POA: Diagnosis not present

## 2021-09-30 DIAGNOSIS — I110 Hypertensive heart disease with heart failure: Secondary | ICD-10-CM | POA: Diagnosis not present

## 2021-09-30 DIAGNOSIS — E1169 Type 2 diabetes mellitus with other specified complication: Secondary | ICD-10-CM | POA: Diagnosis not present

## 2021-09-30 DIAGNOSIS — F330 Major depressive disorder, recurrent, mild: Secondary | ICD-10-CM | POA: Diagnosis not present

## 2021-09-30 DIAGNOSIS — Z6841 Body Mass Index (BMI) 40.0 and over, adult: Secondary | ICD-10-CM | POA: Diagnosis not present

## 2021-09-30 DIAGNOSIS — E1151 Type 2 diabetes mellitus with diabetic peripheral angiopathy without gangrene: Secondary | ICD-10-CM | POA: Diagnosis not present

## 2021-09-30 DIAGNOSIS — K7210 Chronic hepatic failure without coma: Secondary | ICD-10-CM | POA: Diagnosis not present

## 2021-09-30 DIAGNOSIS — K7460 Unspecified cirrhosis of liver: Secondary | ICD-10-CM | POA: Diagnosis not present

## 2021-09-30 DIAGNOSIS — D692 Other nonthrombocytopenic purpura: Secondary | ICD-10-CM | POA: Diagnosis not present

## 2021-09-30 DIAGNOSIS — E785 Hyperlipidemia, unspecified: Secondary | ICD-10-CM | POA: Diagnosis not present

## 2021-09-30 DIAGNOSIS — J449 Chronic obstructive pulmonary disease, unspecified: Secondary | ICD-10-CM | POA: Diagnosis not present

## 2021-10-01 NOTE — Telephone Encounter (Signed)
I called [facility] They said patient needed to run the rx through the insurance they cant use the cash price that we sent in. Also we have to submit an    appeal letter. I will contact the patient to inform   Informed pt

## 2021-10-07 NOTE — Telephone Encounter (Signed)
Inbound call from patient's daughter requesting a call please in regards to medication.  Did not specify further.

## 2021-10-08 ENCOUNTER — Encounter: Payer: Self-pay | Admitting: Gastroenterology

## 2021-10-08 ENCOUNTER — Other Ambulatory Visit: Payer: Self-pay

## 2021-10-08 MED ORDER — RIFAXIMIN 550 MG PO TABS: 550.0000 mg | ORAL_TABLET | Freq: Two times a day (BID) | ORAL | 0 refills | Status: DC

## 2021-10-14 NOTE — Telephone Encounter (Signed)
Spoke with patient daughter through My Chart. I explained to her what we needed  ?

## 2021-10-20 ENCOUNTER — Other Ambulatory Visit: Payer: Self-pay

## 2021-10-20 ENCOUNTER — Inpatient Hospital Stay (HOSPITAL_COMMUNITY): Payer: PPO | Attending: Hematology

## 2021-10-20 VITALS — BP 144/65 | HR 50 | Temp 96.6°F | Resp 20

## 2021-10-20 DIAGNOSIS — E538 Deficiency of other specified B group vitamins: Secondary | ICD-10-CM | POA: Insufficient documentation

## 2021-10-20 DIAGNOSIS — Z79899 Other long term (current) drug therapy: Secondary | ICD-10-CM | POA: Diagnosis not present

## 2021-10-20 MED ORDER — CYANOCOBALAMIN 1000 MCG/ML IJ SOLN
1000.0000 ug | Freq: Once | INTRAMUSCULAR | Status: AC
  Administered 2021-10-20: 1000 ug via INTRAMUSCULAR
  Filled 2021-10-20: qty 1

## 2021-10-20 NOTE — Patient Instructions (Signed)
[facility]  Discharge Instructions: Thank you for choosing [facility] to provide your oncology and hematology care.  If you have a lab appointment with the Cancer Center, please come in thru the Main Entrance and check in at the main information desk.  Wear comfortable clothing and clothing appropriate for easy access to any Portacath or PICC line.   We strive to give you quality time with your provider. You may need to reschedule your appointment if you arrive late (15 or more minutes).  Arriving late affects you and other patients whose appointments are after yours.  Also, if you miss three or more appointments without notifying the office, you may be dismissed from the clinic at the provider's discretion.      For prescription refill requests, have your pharmacy contact our office and allow 72 hours for refills to be completed.        To help prevent nausea and vomiting after your treatment, we encourage you to take your nausea medication as directed.  BELOW ARE SYMPTOMS THAT SHOULD BE REPORTED IMMEDIATELY: *FEVER GREATER THAN 100.4 F (38 C) OR HIGHER *CHILLS OR SWEATING *NAUSEA AND VOMITING THAT IS NOT CONTROLLED WITH YOUR NAUSEA MEDICATION *UNUSUAL SHORTNESS OF BREATH *UNUSUAL BRUISING OR BLEEDING *URINARY PROBLEMS (pain or burning when urinating, or frequent urination) *BOWEL PROBLEMS (unusual diarrhea, constipation, pain near the anus) TENDERNESS IN MOUTH AND THROAT WITH OR WITHOUT PRESENCE OF ULCERS (sore throat, sores in mouth, or a toothache) UNUSUAL RASH, SWELLING OR PAIN  UNUSUAL VAGINAL DISCHARGE OR ITCHING   Items with * indicate a potential emergency and should be followed up as soon as possible or go to the Emergency Department if any problems should occur.  Please show the CHEMOTHERAPY ALERT CARD or IMMUNOTHERAPY ALERT CARD at check-in to the Emergency Department and triage nurse.  Should you have questions after your visit or need to cancel  or reschedule your appointment, please contact [facility] [phone]  and follow the prompts.  Office hours are 8:00 a.m. to 4:30 p.m. Monday - Friday. Please note that voicemails left after 4:00 p.m. may not be returned until the following business day.  We are closed weekends and major holidays. You have access to a nurse at all times for urgent questions. Please call the main number to the clinic [phone] and follow the prompts.  For any non-urgent questions, you may also contact your provider using MyChart. We now offer e-Visits for anyone 18 and older to request care online for non-urgent symptoms. For details visit [email or web address].   Also download the MyChart app! Go to the app store, search "MyChart", open the app, select [facility], and log in with your MyChart username and password.  Due to Covid, a mask is required upon entering the hospital/clinic. If you do not have a mask, one will be given to you upon arrival. For doctor visits, patients may have 1 support person aged 18 or older with them. For treatment visits, patients cannot have anyone with them due to current Covid guidelines and our immunocompromised population.  

## 2021-10-20 NOTE — Progress Notes (Signed)
Patient tolerated Vitamin B12 injection with no complaints voiced.  Site clean and dry with no bruising or swelling noted.  No complaints of pain.  Discharged with vital signs stable and no signs or symptoms of distress noted.   ?

## 2021-11-02 ENCOUNTER — Other Ambulatory Visit: Payer: Self-pay | Admitting: Gastroenterology

## 2021-11-12 ENCOUNTER — Ambulatory Visit (INDEPENDENT_AMBULATORY_CARE_PROVIDER_SITE_OTHER): Payer: PPO

## 2021-11-12 ENCOUNTER — Ambulatory Visit (INDEPENDENT_AMBULATORY_CARE_PROVIDER_SITE_OTHER): Payer: PPO | Admitting: Family Medicine

## 2021-11-12 VITALS — BP 111/53 | HR 51 | Temp 98.1°F | Ht 60.75 in | Wt 229.2 lb

## 2021-11-12 DIAGNOSIS — R609 Edema, unspecified: Secondary | ICD-10-CM

## 2021-11-12 DIAGNOSIS — K7581 Nonalcoholic steatohepatitis (NASH): Secondary | ICD-10-CM

## 2021-11-12 DIAGNOSIS — M25562 Pain in left knee: Secondary | ICD-10-CM | POA: Diagnosis not present

## 2021-11-12 DIAGNOSIS — G8929 Other chronic pain: Secondary | ICD-10-CM

## 2021-11-12 DIAGNOSIS — K7460 Unspecified cirrhosis of liver: Secondary | ICD-10-CM | POA: Diagnosis not present

## 2021-11-12 NOTE — Patient Instructions (Signed)
Chronic Knee Pain, Adult °Chronic knee pain is pain in one or both knees that lasts longer than 3 months. Symptoms of chronic knee pain may include swelling, stiffness, and discomfort. Age-related wear and tear (osteoarthritis) of the knee joint is the most common cause of chronic knee pain. Other possible causes include: °A long-term immune-related disease that causes inflammation of the knee (rheumatoid arthritis). This usually affects both knees. °Inflammatory arthritis, such as gout or pseudogout. °An injury to the knee that causes arthritis. °An injury to the knee that damages the ligaments. Ligaments are strong tissues that connect bones to each other. °Runner's knee or pain behind the kneecap. °Treatment for chronic knee pain depends on the cause. The main treatments for chronic knee pain are physical therapy and weight loss. This condition may also be treated with medicines, injections, a knee sleeve or brace, and by using crutches. Rest, ice, pressure (compression), and elevation, also known as RICE therapy, may also be recommended. °Follow these instructions at home: °If you have a knee sleeve or brace: ° °Wear the knee sleeve or brace as told by your health care provider. Remove it only as told by your health care provider. °Loosen it if your toes tingle, become numb, or turn cold and blue. °Keep it clean. °If the sleeve or brace is not waterproof: °Do not let it get wet. °Remove it if allowed by your health care provider, or cover it with a watertight covering when you take a bath or a shower. °Managing pain, stiffness, and swelling °  °If directed, apply heat to the affected area as often as told by your health care provider. Use the heat source that your health care provider recommends, such as a moist heat pack or a heating pad. °If you have a removable knee sleeve or brace, remove it as told by your health care provider. °Place a towel between your skin and the heat source. °Leave the heat on for  20-30 minutes. °Remove the heat if your skin turns bright red. This is especially important if you are unable to feel pain, heat, or cold. You may have a greater risk of getting burned. °If directed, put ice on the affected area. To do this: °If you have a removable knee sleeve or brace, remove it as told by your health care provider. °Put ice in a plastic bag. °Place a towel between your skin and the bag. °Leave the ice on for 20 minutes, 2-3 times a day. °Remove the ice if your skin turns bright red. This is very important. If you cannot feel pain, heat, or cold, you have a greater risk of damage to the area. °Move your toes often to reduce stiffness and swelling. °Raise (elevate) the injured area above the level of your heart while you are sitting or lying down. °Activity °Avoid high-impact activities or exercises, such as running, jumping rope, or doing jumping jacks. °Follow the exercise plan that your health care provider designed for you. Your health care provider may suggest that you: °Avoid activities that make knee pain worse. This may require you to change your exercise routines, sport participation, or job duties. °Wear shoes with cushioned soles. °Avoid sports that require running and sudden changes in direction. °Do physical therapy. Physical therapy is planned to match your needs and abilities. It may include exercises for strength, flexibility, stability, and endurance. °Do exercises that increase balance and strength, such as tai chi and yoga. °Do not use the injured limb to support your body weight   until your health care provider says that you can. Use crutches as told by your health care provider. °Return to your normal activities as told by your health care provider. Ask your health care provider what activities are safe for you. °General instructions °Take over-the-counter and prescription medicines only as told by your health care provider. °Lose weight if you are overweight. Losing even a  little weight can reduce knee pain. Ask your health care provider what your ideal weight is, and how to safely lose extra weight. A dietitian may be able to help you plan your meals. °Do not use any products that contain nicotine or tobacco, such as cigarettes, e-cigarettes, and chewing tobacco. These can delay healing. If you need help quitting, ask your health care provider. °Keep all follow-up visits. This is important. °Contact a health care provider if: °You have knee pain that is not getting better or gets worse. °You are unable to do your physical therapy exercises due to knee pain. °Get help right away if: °Your knee swells and the swelling becomes worse. °You cannot move your knee. °You have severe knee pain. °Summary °Knee pain that lasts more than 3 months is considered chronic knee pain. °The main treatments for chronic knee pain are physical therapy and weight loss. You may also need to take medicines, wear a knee sleeve or brace, use crutches, and apply ice or heat. °Losing even a little weight can reduce knee pain. Ask your health care provider what your ideal weight is, and how to safely lose extra weight. A dietitian may be able to help you plan your meals. °Follow the exercise plan that your health care provider designed for you. °This information is not intended to replace advice given to you by your health care provider. Make sure you discuss any questions you have with your health care provider. °Document Revised: 01/15/2020 Document Reviewed: 01/15/2020 °Elsevier Patient Education © 2022 [employer] ° °

## 2021-11-12 NOTE — Progress Notes (Signed)
? ?Acute Office Visit ? ?Subjective:  ? ? Patient ID: Stacey Summers, female    DOB: 02-21-1946, 76 y.o.   MRN: [id number] ? ?Chief Complaint  ?Patient presents with  ? Knee Pain  ? Edema  ? ? ?HPI ?Here with daughter today.  ? ?Mckynzi reports chronic left knee pain. She reports that the pain has been slowly getting worse for the last year. She denies injury, erythema, or fever. She reports that it feel tender. She reports the pain is mild or moderate. The pain is achy. It is worse with activity. She has had a xray a few years ago that showed mild arthritis. She takes tramadol when the pain is more intense.  ? ?She also reports that her legs and feet have been swelling more than usual. She has been taking 80 mg of lasix for the last 2 weeks without improvement. She denies weight gain, confusion, abdominal pain, chest pain, or increased shortness of breath.  ? ?She has a f/u appt with GI on 11/15/21. ? ?Past Medical History:  ?Diagnosis Date  ? Allergy   ? seasonal  ? Anxiety   ? Arthritis   ? hands, back   ? B12 deficiency 03/31/2021  ? Cataract   ? surgery to remove  ? Chronic back pain   ? lower back   ? Cirrhosis of liver without ascites ([place])   ? recent dx 07/11/18  ? COPD (chronic obstructive pulmonary disease) ([place])   ? no inhaler  ? Diabetes mellitus without complication ([place])   ? DJD (degenerative joint disease)   ? Fibromyalgia   ? Genetic testing 03/08/2017  ? Ms. [place] underwent genetic counseling and testing for hereditary cancer syndromes on 02/21/2017. Her results were negative for mutations in all 83 genes analyzed by [facility]'s 83-gene Multi-Cancers Panel. Genes analyzed include: ALK, APC, ATM, AXIN2, BAP1, BARD1, BLM, BMPR1A, BRCA1, BRCA2, BRIP1, CASR, CDC73, CDH1, CDK4, CDKN1B, CDKN1C, CDKN2A, CEBPA, CHEK2, CTNNA1, DICER1, DIS3L2, EGFR, EPCAM  ? GERD (gastroesophageal reflux disease)   ? 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 History of abnormal mammogram   ? Hyperlipidemia   ? Hypertension    ? Irregular heart beat   ? Neuromuscular disorder ([place])   ? fibromyalgia  ? Sleep apnea   ? cpap some  ? SVD (spontaneous vaginal delivery)   ? x 2  ? Tubular adenoma of colon 05/09/12  ? "innumerable number" of flat polyps  ? ? ?Past Surgical History:  ?Procedure Laterality Date  ? CARPAL TUNNEL RELEASE Left 10/20/14  ? [facility]  ? CATARACT EXTRACTION, BILATERAL    ? [facility]  ? CHOLECYSTECTOMY  1990  ? COLONOSCOPY    ? IR RADIOLOGIST EVAL & MGMT  07/16/2019  ? KNEE ARTHROSCOPY WITH SUBCHONDROPLASTY Right 03/27/2018  ? Procedure: Right knee arthroscopic partial medial meniscus repair versus menisectomy with medial tibia subchondroplasty;  Surgeon: Nicholes Stairs, MD;  Location: [facility];  Service: Orthopedics;  Laterality: Right;  120 mins  ? L[facility]NX SURGERY    ? POLYP REMOVAL  ? MULTIPLE TOOTH EXTRACTIONS    ? POLYPECTOMY    ? vocal cord  ? TONSILLECTOMY  1965  ? TUBAL LIGATION    ? TUMOR REMOVAL    ? benign fatty tumors on abdomen  ? UPPER GASTROINTESTINAL ENDOSCOPY    ? ? ?Family History  ?Problem Relation Age of Onset  ? Prostate cancer Father   ?     d.89  diagnosed in late-60s  ? Colon cancer Father   ?     diagnosed at 69  ? Heart failure Mother   ?     d.37  ? Heart disease Mother   ? Colon polyps Brother   ?     polyposis. Underwent partial colectomy at age 51.  ? Spina bifida Brother   ?     died at birth  ? Stomach cancer Paternal Aunt   ?     questionable  ? Kidney cancer Cousin   ?     d.97s maternal first-cousin  ? Other Other 16  ?     multiple lipomas  ? Colon cancer Paternal Uncle   ?     in his 52s  ? Heart failure Maternal Grandmother   ?     d.45  ? Heart failure Maternal Uncle   ?     d.50  ? Colon polyps Son 1  ? Heart attack Son 23  ? Leukemia Maternal Aunt   ? Esophageal cancer Neg Hx   ? Rectal cancer Neg Hx   ? ? ?Social History  ? ?Socioeconomic History  ? Marital status: Married  ?  Spouse name: [name] "goes by [employer]"  ? Number of children: 3  ? Years of education: 13  ?  Highest education level: GED or equivalent  ?Occupational History  ? Occupation: retired  ?Tobacco Use  ? Smoking status: Former  ?  Packs/day: 1.00  ?  Years: 3.00  ?  Pack years: 3.00  ?  Types: Cigarettes  ?  Quit date: 08/15/1969  ?  Years since quitting: 52.2  ? Smokeless tobacco: Never  ?Vaping Use  ? Vaping Use: Never used  ?Substance and Sexual Activity  ? Alcohol use: No  ?  Alcohol/week: 0.0 standard drinks  ? Drug use: No  ? Sexual activity: Not Currently  ?Other Topics Concern  ? Not on file  ?Social History Narrative  ? Not on file  ? ?Social Determinants of Health  ? ?Financial Resource Strain: Low Risk   ? Difficulty of Paying Living Expenses: Not very hard  ?Food Insecurity: No Food Insecurity  ? Worried About Charity fundraiser in the Last Year: Never true  ? Ran Out of Food in the Last Year: Never true  ?Transportation Needs: No Transportation Needs  ? Lack of Transportation (Medical): No  ? Lack of Transportation (Non-Medical): No  ?Physical Activity: Inactive  ? Days of Exercise per Week: 0 days  ? Minutes of Exercise per Session: 0 min  ?Stress: No Stress Concern Present  ? Feeling of Stress : Only a little  ?Social Connections: Socially Integrated  ? Frequency of Communication with Friends and Family: More than three times a week  ? Frequency of Social Gatherings with Friends and Family: More than three times a week  ? Attends Religious Services: More than 4 times per year  ? Active Member of Clubs or Organizations: Yes  ? Attends Archivist Meetings: More than 4 times per year  ? Marital Status: Married  ?Intimate Partner Violence: Not At Risk  ? Fear of Current or Ex-Partner: No  ? Emotionally Abused: No  ? Physically Abused: No  ? Sexually Abused: No  ? ? ?Outpatient Medications Prior to Visit  ?Medication Sig Dispense Refill  ? albuterol (VENTOLIN HFA) 108 (90 Base) MCG/ACT inhaler Inhale 2 puffs into the lungs every 6 (six) hours as needed for wheezing or shortness of breath.  18 g  0  ? ALPRAZolam (XANAX) 0.25 MG tablet alprazolam 0.25 mg tablet    ? amLODipine (NORVASC) 5 MG tablet amlodipine 5 mg tablet    ? atorvastatin (LIPITOR) 20 MG tablet Take 1 tablet (20 mg total) by mouth daily. 90 tablet 3  ? benazepril (LOTENSIN) 20 MG tablet Take 1 tablet by mouth once daily 90 tablet 1  ? Biotin 5000 MCG SUBL Place under the tongue.    ? blood glucose meter kit and supplies KIT Dispense based on patient and insurance preference. Use up to four times daily as directed. (FOR ICD-9 250.00, 250.01). 1 each 10  ? Blood Glucose Monitoring Suppl (ONETOUCH VERIO FLEX SYSTEM) w/Device KIT Check Bs up to 4 times daily Dx E11.9, 1 kit 0  ? Cholecalciferol (VITAMIN D3) 5000 units CAPS Take 5,000 Units by mouth daily.    ? cyclobenzaprine (FLEXERIL) 5 MG tablet TAKE 1 TABLET BY MOUTH THREE TIMES DAILY AS NEEDED FOR MUSCLE SPASMS (NEEDS APPOINTMENT) 30 tablet 0  ? diltiazem (CARDIZEM CD) 120 MG 24 hr capsule Take 1 capsule (120 mg total) by mouth daily. 90 capsule 1  ? ferrous sulfate 325 (65 FE) MG EC tablet Take 1 tablet (325 mg total) by mouth daily. 30 tablet 0  ? fluticasone (FLONASE) 50 MCG/ACT nasal spray USE 1 SPRAY(S) IN EACH NOSTRIL IN THE MORNING AND AT BEDTIME 48 g 0  ? furosemide (LASIX) 40 MG tablet TAKE 1 TABLET BY MOUTH IN THE MORNING AND 1/2 (ONE-HALF) ONCE DAILY AT  1PM 135 tablet 0  ? glucose blood (ONETOUCH VERIO) test strip CHECK GLUCOSE UP TO 4 TIMES DAILY Dx E11.69 400 each 3  ? lactulose (CHRONULAC) 10 GM/15ML solution TAKE 15 ML BY MOUTH  THREE TIMES DAILY AS NEEDED 946 mL 0  ? LORazepam (ATIVAN) 0.5 MG tablet Take 0.5-1 tablets (0.25-0.5 mg total) by mouth 2 (two) times daily as needed for anxiety. 30 tablet 1  ? meclizine (ANTIVERT) 25 MG tablet Take 1 tablet (25 mg total) by mouth 3 (three) times daily as needed for dizziness. 30 tablet 0  ? metFORMIN (GLUCOPHAGE) 500 MG tablet Take 1 tablet (500 mg total) by mouth daily with breakfast. 90 tablet 1  ? omeprazole (PRILOSEC) 40 MG  capsule omeprazole 40 mg capsule,delayed release ? TAKE 1 CAPSULE BY MOUTH ONCE DAILY    ? ondansetron (ZOFRAN) 4 MG tablet Take 1 tablet (4 mg total) by mouth every 8 (eight) hours as needed for nause

## 2021-11-15 ENCOUNTER — Encounter: Payer: Self-pay | Admitting: Gastroenterology

## 2021-11-15 ENCOUNTER — Ambulatory Visit: Payer: PPO | Admitting: Gastroenterology

## 2021-11-15 ENCOUNTER — Other Ambulatory Visit (INDEPENDENT_AMBULATORY_CARE_PROVIDER_SITE_OTHER): Payer: PPO

## 2021-11-15 VITALS — BP 122/70 | HR 72 | Ht 62.0 in | Wt 231.2 lb

## 2021-11-15 DIAGNOSIS — K7581 Nonalcoholic steatohepatitis (NASH): Secondary | ICD-10-CM | POA: Diagnosis not present

## 2021-11-15 DIAGNOSIS — K7682 Hepatic encephalopathy: Secondary | ICD-10-CM

## 2021-11-15 LAB — CBC WITH DIFFERENTIAL/PLATELET
Basophils Absolute: 0 10*3/uL (ref 0.0–0.1)
Basophils Relative: 0.7 % (ref 0.0–3.0)
Eosinophils Absolute: 0.2 10*3/uL (ref 0.0–0.7)
Eosinophils Relative: 4.2 % (ref 0.0–5.0)
HCT: 38.5 % (ref 36.0–46.0)
Hemoglobin: 13.3 g/dL (ref 12.0–15.0)
Lymphocytes Relative: 36.2 % (ref 12.0–46.0)
Lymphs Abs: 1.9 10*3/uL (ref 0.7–4.0)
MCHC: 34.5 g/dL (ref 30.0–36.0)
MCV: 99.6 fl (ref 78.0–100.0)
Monocytes Absolute: 0.5 10*3/uL (ref 0.1–1.0)
Monocytes Relative: 9.9 % (ref 3.0–12.0)
Neutro Abs: 2.5 10*3/uL (ref 1.4–7.7)
Neutrophils Relative %: 49 % (ref 43.0–77.0)
Platelets: 69 10*3/uL — ABNORMAL LOW (ref 150.0–400.0)
RBC: 3.86 Mil/uL — ABNORMAL LOW (ref 3.87–5.11)
RDW: 14.3 % (ref 11.5–15.5)
WBC: 5.1 10*3/uL (ref 4.0–10.5)

## 2021-11-15 LAB — COMPREHENSIVE METABOLIC PANEL
ALT: 28 U/L (ref 0–35)
AST: 48 U/L — ABNORMAL HIGH (ref 0–37)
Albumin: 3.1 g/dL — ABNORMAL LOW (ref 3.5–5.2)
Alkaline Phosphatase: 121 U/L — ABNORMAL HIGH (ref 39–117)
BUN: 24 mg/dL — ABNORMAL HIGH (ref 6–23)
CO2: 32 mEq/L (ref 19–32)
Calcium: 9.1 mg/dL (ref 8.4–10.5)
Chloride: 107 mEq/L (ref 96–112)
Creatinine, Ser: 0.84 mg/dL (ref 0.40–1.20)
GFR: 68.83 mL/min (ref >60.00)
Glucose, Bld: 141 mg/dL — ABNORMAL HIGH (ref 70–99)
Potassium: 4.1 mEq/L (ref 3.5–5.1)
Sodium: 143 mEq/L (ref 135–145)
Total Bilirubin: 2.7 mg/dL — ABNORMAL HIGH (ref 0.2–1.2)
Total Protein: 5.5 g/dL — ABNORMAL LOW (ref 6.0–8.3)

## 2021-11-15 LAB — PROTIME-INR
INR: 1.4 ratio — ABNORMAL HIGH (ref 0.8–1.0)
Prothrombin Time: 15 s — ABNORMAL HIGH (ref 9.6–13.1)

## 2021-11-15 MED ORDER — RIFAXIMIN 550 MG PO TABS: 550.0000 mg | ORAL_TABLET | Freq: Two times a day (BID) | ORAL | 3 refills | Status: DC

## 2021-11-15 NOTE — Patient Instructions (Signed)
Your provider has requested that you go to the basement level for lab work before leaving today. Press "B" on the elevator. The lab is located at the first door on the left as you exit the elevator.  ? ?If you are age 74 or older, your body mass index should be between 23-30. Your Body mass index is 42.3 kg/m?Marland Kitchen If this is out of the aforementioned range listed, please consider follow up with your Primary Care Provider. ? ?If you are age 90 or younger, your body mass index should be between 19-25. Your Body mass index is 42.3 kg/m?Marland Kitchen If this is out of the aformentioned range listed, please consider follow up with your Primary Care Provider.  ? ?________________________________________________________ ? ?The [facility] providers would like to encourage you to use [facility] to communicate with providers for non-urgent requests or questions.  Due to long hold times on the telephone, sending your provider a message by [facility] may be a faster and more efficient way to get a response.  Please allow 48 business hours for a response.  Please remember that this is for non-urgent requests.  ?_______________________________________________________ Due to recent changes in healthcare laws, you may see the results of your imaging and laboratory studies on MyChart before your provider has had a chance to review them.  We understand that in some cases there may be results that are confusing or concerning to you. Not all laboratory results come back in the same time frame and the provider may be waiting for multiple results in order to interpret others.  Please give [place] 48 hours in order for your provider to thoroughly review all the results before contacting the office for clarification of your results.   ? ?I appreciate the  opportunity to care for you ? ?Thank You  ? ?Harl Bowie , MD  ?

## 2021-11-15 NOTE — Progress Notes (Signed)
? ?       ? ?Stacey Summers    [id number]    1946-05-22 ? ?Primary Care Physician:Gottschalk, Stacey Summers ? ?Referring Physician: Janora Norlander, Summers ?[address] [place],  [place] [address] ? ? ?Chief complaint:  Cirrhosis, hepatic encephalopathy ? ?HPI: ? 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 ? ?Complains of left knee pain, she has been having it since fall.  She feels physical therapy.  More attention to her shoulder than her knee ?Lower extremity swelling left > right, she is wearing support stockings, noticing some improvement ?  ?Overall she feels much better but there are days when she has decreased energy and continues to have days where she sleeps more during daytime. ?She continues to have intermittent episodes of confusion, mood changes and changes in sleep pattern.  She tends to sleep during daytime as she has difficulty falling asleep at night.  According to patient she sleeps during the day because she is bored and there is nothing else to Summers ?  ?Denies any worsening confusion, abdominal distention, melena or rectal bleeding. ?No vomiting, abdominal bloating or weight gain.  ? ?GI/Liver Hx ? ?She was [facility], please see full note in Care Everywhere for details ?  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 ?- No gross lesions in esophagus. ?- Portal hypertensive gastropathy. ?- Normal examined duodenum. ?  ?EGD 08/07/2018: ?- The Z-line was regular and was found 36 cm from the incisors. ?- No gross lesions were noted in the entire esophagus. No esophageal varices. ?- The stomach was normal. No gastric varices. ?- The examined duodenum was normal. ?  ?Colonoscopy 08/07/2018 ?- Four 4 to 9 mm polyps in the transverse colon and in the ascending colon, removed with a ?cold snare. Resected and retrieved. ?- Diverticulosis in the  sigmoid colon. ?- Non-bleeding internal hemorrhoids. ?  ?Relevant history: ?History of COVID-19 infection November 2020.  Worsening mental status with confusion and somnolence post Covid. ?  ?She has prominent splenorenal shunt, was referred to IR to discuss possible embolization.  But given she has significant improvement of symptoms with Xifaxan twice daily, family and patient preferred to hold off the embolization. ?  ? ? ?Outpatient Encounter Medications as of 11/15/2021  ?Medication Sig  ? albuterol (VENTOLIN HFA) 108 (90 Base) MCG/ACT inhaler Inhale 2 puffs into the lungs every 6 (six) hours as needed for wheezing or shortness of breath.  ? ALPRAZolam (XANAX) 0.25 MG tablet alprazolam 0.25 mg tablet  ? amLODipine (NORVASC) 5 MG tablet amlodipine 5 mg tablet  ? atorvastatin (LIPITOR) 20 MG tablet Take 1 tablet (20 mg total) by mouth daily.  ? benazepril (LOTENSIN) 20 MG tablet Take 1 tablet by mouth once daily  ? Biotin 5000 MCG SUBL Place under the tongue.  ? blood glucose meter kit and supplies KIT Dispense based on patient and insurance preference. Use up to four times daily as directed. (FOR ICD-9 250.00, 250.01).  ? Blood Glucose Monitoring Suppl (ONETOUCH VERIO FLEX SYSTEM) w/Device KIT Check Bs up to 4 times daily Dx E11.9,  ? Cholecalciferol (VITAMIN D3) 5000 units CAPS Take 5,000 Units by mouth daily.  ? cyclobenzaprine (FLEXERIL) 5 MG tablet TAKE 1 TABLET BY MOUTH THREE TIMES DAILY AS NEEDED FOR MUSCLE SPASMS (NEEDS APPOINTMENT)  ? diltiazem (CARDIZEM CD) 120 MG 24 hr capsule Take 1 capsule (  120 mg total) by mouth daily.  ? ferrous sulfate 325 (65 FE) MG EC tablet Take 1 tablet (325 mg total) by mouth daily.  ? fluticasone (FLONASE) 50 MCG/ACT nasal spray USE 1 SPRAY(S) IN EACH NOSTRIL IN THE MORNING AND AT BEDTIME  ? furosemide (LASIX) 40 MG tablet TAKE 1 TABLET BY MOUTH IN THE MORNING AND 1/2 (ONE-HALF) ONCE DAILY AT  1PM  ? glucose blood (ONETOUCH VERIO) test strip CHECK GLUCOSE UP TO 4 TIMES DAILY  Dx E11.69  ? lactulose (CHRONULAC) 10 GM/15ML solution TAKE 15 ML BY MOUTH  THREE TIMES DAILY AS NEEDED  ? LORazepam (ATIVAN) 0.5 MG tablet Take 0.5-1 tablets (0.25-0.5 mg total) by mouth 2 (two) times daily as needed for anxiety.  ? meclizine (ANTIVERT) 25 MG tablet Take 1 tablet (25 mg total) by mouth 3 (three) times daily as needed for dizziness.  ? metFORMIN (GLUCOPHAGE) 500 MG tablet Take 1 tablet (500 mg total) by mouth daily with breakfast.  ? omeprazole (PRILOSEC) 40 MG capsule omeprazole 40 mg capsule,delayed release ? TAKE 1 CAPSULE BY MOUTH ONCE DAILY  ? ondansetron (ZOFRAN) 4 MG tablet Take 1 tablet (4 mg total) by mouth every 8 (eight) hours as needed for nausea or vomiting.  ? ondansetron (ZOFRAN-ODT) 4 MG disintegrating tablet Take 1 tablet (4 mg total) by mouth every 6 (six) hours as needed for nausea or vomiting.  ? ONETOUCH DELICA LANCETS 71I MISC USE ONE LANCET TO CHECK GLUCOSE ONCE DAILY AND AS NEEDED  ? rifaximin (XIFAXAN) 550 MG TABS tablet Take 1 tablet (550 mg total) by mouth 2 (two) times daily.  ? traMADol (ULTRAM) 50 MG tablet Take 1 tablet (50 mg total) by mouth every 12 (twelve) hours as needed for moderate pain.  ? valACYclovir (VALTREX) 1000 MG tablet valacyclovir 1 gram tablet  ? ?No facility-administered encounter medications on file as of 11/15/2021.  ? ? ?Allergies as of 11/15/2021 - Review Complete 11/12/2021  ?Allergen Reaction Noted  ? Latex  03/27/2018  ? ? ?Past Medical History:  ?Diagnosis Date  ? Allergy   ? seasonal  ? Anxiety   ? Arthritis   ? hands, back   ? B12 deficiency 03/31/2021  ? Cataract   ? surgery to remove  ? Chronic back pain   ? lower back   ? Cirrhosis of liver without ascites ([facility])   ? recent dx 07/11/18  ? COPD (chronic obstructive pulmonary disease) ([facility])   ? no inhaler  ? Diabetes mellitus without complication ([facility])   ? DJD (degenerative joint disease)   ? Fibromyalgia   ? Genetic testing 03/08/2017  ? Stacey Summers underwent genetic counseling and testing  for hereditary cancer syndromes on 02/21/2017. Her results were negative for mutations in all 83 genes analyzed by [facility]'s 83-gene Multi-Cancers Panel. Genes analyzed include: ALK, APC, ATM, AXIN2, BAP1, BARD1, BLM, BMPR1A, BRCA1, BRCA2, BRIP1, CASR, CDC73, CDH1, CDK4, CDKN1B, CDKN1C, CDKN2A, CEBPA, CHEK2, CTNNA1, DICER1, DIS3L2, EGFR, EPCAM  ? GERD (gastroesophageal reflux disease)   ? 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 History of abnormal mammogram   ? Hyperlipidemia   ? Hypertension   ? Irregular heart beat   ? Neuromuscular disorder ([facility])   ? fibromyalgia  ? Sleep apnea   ? cpap some  ? SVD (spontaneous vaginal delivery)   ? x 2  ? Tubular adenoma of colon 05/09/12  ? "innumerable number" of flat polyps  ? ? ?Past Surgical History:  ?Procedure Laterality  Date  ? CARPAL TUNNEL RELEASE Left 10/20/14  ? [facility]  ? CATARACT EXTRACTION, BILATERAL    ? [facility]  ? CHOLECYSTECTOMY  1990  ? COLONOSCOPY    ? IR RADIOLOGIST EVAL & MGMT  07/16/2019  ? KNEE ARTHROSCOPY WITH SUBCHONDROPLASTY Right 03/27/2018  ? Procedure: Right knee arthroscopic partial medial meniscus repair versus menisectomy with medial tibia subchondroplasty;  Surgeon: Nicholes Stairs, MD;  Location: [facility];  Service: Orthopedics;  Laterality: Right;  120 mins  ? LARYNX SURGERY    ? POLYP REMOVAL  ? MULTIPLE TOOTH EXTRACTIONS    ? POLYPECTOMY    ? vocal cord  ? TONSILLECTOMY  1965  ? TUBAL LIGATION    ? TUMOR REMOVAL    ? benign fatty tumors on abdomen  ? UPPER GASTROINTESTINAL ENDOSCOPY    ? ? ?Family History  ?Problem Relation Age of Onset  ? Prostate cancer Father   ?     d.89 diagnosed in late-60s  ? Colon cancer Father   ?     diagnosed at 33  ? Heart failure Mother   ?     d.37  ? Heart disease Mother   ? Colon polyps Brother   ?     polyposis. Underwent partial colectomy at age 65.  ? Spina bifida Brother   ?     died at birth  ? Stomach cancer Paternal Aunt   ?     questionable  ? Kidney cancer Cousin   ?     d.89s  maternal first-cousin  ? Other Other 16  ?     multiple lipomas  ? Colon cancer Paternal Uncle   ?     in his 52s  ? Heart failure Maternal Grandmother   ?     d.45  ? Heart failure Maternal Uncle   ?

## 2021-11-16 ENCOUNTER — Telehealth: Payer: Self-pay | Admitting: Family Medicine

## 2021-11-16 NOTE — Telephone Encounter (Signed)
Patient came in to see Tiffany on 3/31 for swelling in feet and legs. She said they discussed her getting compression socks and she has decided that she would like to try them. Can a prescription be written for patient to get these?   ?

## 2021-11-17 ENCOUNTER — Inpatient Hospital Stay (HOSPITAL_COMMUNITY): Payer: PPO | Attending: Hematology

## 2021-11-17 ENCOUNTER — Ambulatory Visit (HOSPITAL_COMMUNITY): Payer: PPO

## 2021-11-17 VITALS — BP 147/49 | HR 56 | Temp 98.4°F | Resp 18

## 2021-11-17 DIAGNOSIS — Z79899 Other long term (current) drug therapy: Secondary | ICD-10-CM | POA: Insufficient documentation

## 2021-11-17 DIAGNOSIS — E538 Deficiency of other specified B group vitamins: Secondary | ICD-10-CM | POA: Insufficient documentation

## 2021-11-17 MED ORDER — CYANOCOBALAMIN 1000 MCG/ML IJ SOLN
1000.0000 ug | Freq: Once | INTRAMUSCULAR | Status: AC
  Administered 2021-11-17: 1000 ug via INTRAMUSCULAR
  Filled 2021-11-17: qty 1

## 2021-11-17 NOTE — Progress Notes (Signed)
Stacey Summers presents today for injection per the provider's orders.  B12 administration without incident; injection site WNL; see MAR for injection details.  Patient tolerated procedure well and without incident.  No questions or complaints noted at this time.  ? ?Discharged from clinic via wheelchair in stable condition. Alert and oriented x 3. F/U with [facility] as scheduled.   ?

## 2021-11-17 NOTE — Patient Instructions (Signed)
[facility]  Discharge Instructions: ?Thank you for choosing [facility] to provide your oncology and hematology care.  ?If you have a lab appointment with the [facility], please come in thru the Main Entrance and check in at the main information desk. ? ?Wear comfortable clothing and clothing appropriate for easy access to any Portacath or PICC line.  ? ?We strive to give you quality time with your provider. You may need to reschedule your appointment if you arrive late (15 or more minutes).  Arriving late affects you and other patients whose appointments are after yours.  Also, if you miss three or more appointments without notifying the office, you may be dismissed from the clinic at the provider?s discretion.    ?  ?For prescription refill requests, have your pharmacy contact our office and allow 72 hours for refills to be completed.   ? ?Today you received B12 injection. ? ? ?BELOW ARE SYMPTOMS THAT SHOULD BE REPORTED IMMEDIATELY: ?*FEVER GREATER THAN 100.4 F (38 ?C) OR HIGHER ?*CHILLS OR SWEATING ?*NAUSEA AND VOMITING THAT IS NOT CONTROLLED WITH YOUR NAUSEA MEDICATION ?*UNUSUAL SHORTNESS OF BREATH ?*UNUSUAL BRUISING OR BLEEDING ?*URINARY PROBLEMS (pain or burning when urinating, or frequent urination) ?*BOWEL PROBLEMS (unusual diarrhea, constipation, pain near the anus) ?TENDERNESS IN MOUTH AND THROAT WITH OR WITHOUT PRESENCE OF ULCERS (sore throat, sores in mouth, or a toothache) ?UNUSUAL RASH, SWELLING OR PAIN  ?UNUSUAL VAGINAL DISCHARGE OR ITCHING  ? ?Items with * indicate a potential emergency and should be followed up as soon as possible or go to the Emergency Department if any problems should occur. ? ?Please show the CHEMOTHERAPY ALERT CARD or IMMUNOTHERAPY ALERT CARD at check-in to the Emergency Department and triage nurse. ? ?Should you have questions after your visit or need to cancel or reschedule your appointment, please contact [facility] [phone]   and follow the prompts.  Office hours are 8:00 a.m. to 4:30 p.m. Monday - Friday. Please note that voicemails left after 4:00 p.m. may not be returned until the following business day.  We are closed weekends and major holidays. You have access to a nurse at all times for urgent questions. Please call the main number to the clinic [phone] and follow the prompts. ? ?For any non-urgent questions, you may also contact your provider using MyChart. We now offer e-Visits for anyone 30 and older to request care online for non-urgent symptoms. For details visit [email or web address]. ?  ?Also download the MyChart app! Go to the app store, search "MyChart", open the app, select [facility], and log in with your MyChart username and password. ? ?Due to Covid, a mask is required upon entering the hospital/clinic. If you do not have a mask, one will be given to you upon arrival. For doctor visits, patients may have 1 support person aged 62 or older with them. For treatment visits, patients cannot have anyone with them due to current Covid guidelines and our immunocompromised population.  ?

## 2021-11-24 ENCOUNTER — Telehealth: Payer: Self-pay | Admitting: Family Medicine

## 2021-11-24 NOTE — Telephone Encounter (Signed)
PT SCHEDULED FOR DM EXAM ?

## 2021-11-24 NOTE — Telephone Encounter (Signed)
See PCPs note please. Needs documented diabetic foot exam.  ?

## 2021-11-24 NOTE — Telephone Encounter (Signed)
Glad to order but she will need appt with recent diabetic exam documenting sensation, callous formation, ulcers, blood flow, etc.  This is the only way to get insurance to pay for it. ?

## 2021-11-24 NOTE — Telephone Encounter (Signed)
LMTCB

## 2021-11-29 DIAGNOSIS — K7290 Hepatic failure, unspecified without coma: Secondary | ICD-10-CM | POA: Diagnosis not present

## 2021-11-29 DIAGNOSIS — J449 Chronic obstructive pulmonary disease, unspecified: Secondary | ICD-10-CM | POA: Diagnosis not present

## 2021-11-29 DIAGNOSIS — K766 Portal hypertension: Secondary | ICD-10-CM | POA: Diagnosis not present

## 2021-11-29 DIAGNOSIS — I509 Heart failure, unspecified: Secondary | ICD-10-CM | POA: Diagnosis not present

## 2021-12-01 ENCOUNTER — Ambulatory Visit (INDEPENDENT_AMBULATORY_CARE_PROVIDER_SITE_OTHER): Payer: PPO | Admitting: Family Medicine

## 2021-12-01 ENCOUNTER — Encounter: Payer: Self-pay | Admitting: Family Medicine

## 2021-12-01 VITALS — BP 130/61 | HR 57 | Temp 97.8°F | Ht 62.0 in | Wt 235.0 lb

## 2021-12-01 DIAGNOSIS — E1140 Type 2 diabetes mellitus with diabetic neuropathy, unspecified: Secondary | ICD-10-CM | POA: Diagnosis not present

## 2021-12-01 DIAGNOSIS — I152 Hypertension secondary to endocrine disorders: Secondary | ICD-10-CM

## 2021-12-01 DIAGNOSIS — K7460 Unspecified cirrhosis of liver: Secondary | ICD-10-CM

## 2021-12-01 DIAGNOSIS — G8929 Other chronic pain: Secondary | ICD-10-CM | POA: Diagnosis not present

## 2021-12-01 DIAGNOSIS — K7581 Nonalcoholic steatohepatitis (NASH): Secondary | ICD-10-CM

## 2021-12-01 DIAGNOSIS — M25562 Pain in left knee: Secondary | ICD-10-CM | POA: Diagnosis not present

## 2021-12-01 DIAGNOSIS — D696 Thrombocytopenia, unspecified: Secondary | ICD-10-CM

## 2021-12-01 DIAGNOSIS — L84 Corns and callosities: Secondary | ICD-10-CM

## 2021-12-01 DIAGNOSIS — E785 Hyperlipidemia, unspecified: Secondary | ICD-10-CM | POA: Diagnosis not present

## 2021-12-01 DIAGNOSIS — E1159 Type 2 diabetes mellitus with other circulatory complications: Secondary | ICD-10-CM

## 2021-12-01 DIAGNOSIS — E1169 Type 2 diabetes mellitus with other specified complication: Secondary | ICD-10-CM

## 2021-12-01 DIAGNOSIS — R600 Localized edema: Secondary | ICD-10-CM | POA: Diagnosis not present

## 2021-12-01 LAB — BAYER DCA HB A1C WAIVED: HB A1C (BAYER DCA - WAIVED): 5.5 % (ref 4.8–5.6)

## 2021-12-01 NOTE — Patient Instructions (Signed)
Referral for your knee and for your leg swelling placed today ?They will call with appointments. ? ?Lymphedema ? ?Lymphedema is swelling that is caused by the abnormal collection of lymph in the tissues under the skin. Lymph is excess fluid from the tissues in your body that is removed through the lymphatic system. This system is part of your body's defense system (immune system) and includes lymph nodes and lymph vessels. The lymph vessels collect and carry the excess fluid, fats, proteins, and waste from the tissues of the body to the bloodstream. This system also works to clean and remove bacteria and waste products from the body. ?Lymphedema occurs when the lymphatic system is blocked. When the lymph vessels or lymph nodes are blocked or damaged, lymph does not drain properly. This causes an abnormal buildup of lymph, which leads to swelling in the affected area. This may include the trunk area, or an arm or leg. Lymphedema cannot be cured by medicines, but various methods can be used to help reduce the swelling. ?What are the causes? ?The cause of this condition depends on the type of lymphedema that you have. ?Primary lymphedema is caused by the absence of lymph vessels or having abnormal lymph vessels at birth. ?Secondary lymphedema occurs when lymph vessels are blocked or damaged. Secondary lymphedema is more common. Common causes of lymph vessel blockage include: ?Skin infection, such as cellulitis. ?Infection by parasites (filariasis). ?Injury. ?Radiation therapy. ?Cancer. ?Formation of scar tissue. ?Surgery. ?What are the signs or symptoms? ?Symptoms of this condition include: ?Swelling of the arm or leg. ?A heavy or tight feeling in the arm or leg. ?Swelling of the feet, toes, or fingers. Shoes or rings may fit more tightly than before. ?Redness of the skin over the affected area. ?Limited movement of the affected limb. ?Sensitivity to touch or discomfort in the affected limb. ?How is this  diagnosed? ?This condition may be diagnosed based on: ?Your symptoms and medical history. ?A physical exam. ?Bioimpedance spectroscopy. In this test, painless electrical currents are used to measure fluid levels in your body. ?Imaging tests, such as: ?MRI. ?CT scan. ?Duplex ultrasound. This test uses sound waves to produce images of the vessels and the blood flow on a screen. ?Lymphoscintigraphy. In this test, a low dose of a radioactive substance is injected to trace the flow of lymph through your lymph vessels. ?Lymphangiography. In this test, a contrast dye is injected into the lymph vessel to help show blockages. ?How is this treated? ? ?If an underlying condition is causing the lymphedema, that condition will be treated. For example, antibiotic medicines may be used to treat an infection. ?Treatment for this condition will depend on the cause of your lymphedema. Treatment may include: ?Complete decongestive therapy (CDT). This is done by a certified lymphedema therapist to reduce fluid congestion. This therapy includes: ?Skin care. ?Compression wrapping of the affected area. ?Manual lymph drainage. This is a special massage technique that promotes lymph drainage out of a limb. ?Specific exercises. Certain exercises can help fluid move out of the affected limb. ?Compression. Various methods may be used to apply pressure to the affected limb to reduce the swelling. They include: ?Wearing compression stockings or sleeves on the affected limb. ?Wrapping the affected limb with special bandages. ?Surgery. This is usually done for severe cases only. For example, surgery may be done if you have trouble moving the limb or if the swelling does not get better with other treatments. ?Follow these instructions at home: ?Self-care ?The affected area is  more likely to become injured or infected. Take these steps to help prevent infection: ?Keep the affected area clean and dry. ?Use approved creams or lotions to keep the skin  moisturized. ?Protect your skin from cuts: ?Use gloves while cooking or gardening. ?Do not walk barefoot. ?If you shave the affected area, use an Copy. ?Do not wear tight clothes, shoes, or jewelry. ?Eat a healthy diet that includes a lot of fruits and vegetables. ?Activity ?Do exercises as told by your health care provider. ?Do not sit with your legs crossed. ?When possible, keep the affected limb raised (elevated) above the level of your heart. ?Avoid carrying things with an arm that is affected by lymphedema. ?General instructions ?Wear compression stockings or sleeves as told by your health care provider. ?Note any changes in size of the affected limb. You may be instructed to take regular measurements and keep track of them. ?Take over-the-counter and prescription medicines only as told by your health care provider. ?If you were prescribed an antibiotic medicine, take or apply it as told by your health care provider. Do not stop using the antibiotic even if you start to feel better or if your condition improves. ?Do not use heating pads or ice packs on the affected area. ?Avoid having blood draws, IV insertions, or blood pressure checks on the affected limb. ?Keep all follow-up visits. This is important. ?Contact a health care provider if you: ?Continue to have swelling in your limb. ?Have fluid leaking from the skin of your swollen limb. ?Have a cut that does not heal. ?Have redness or pain in the affected area. ?Develop purplish spots, rash, blisters, or sores (lesions) on your affected limb. ?Get help right away if you: ?Have new swelling in your limb that starts suddenly. ?Have shortness of breath or chest pain. ?Have a fever or chills. ?These symptoms may represent a serious problem that is an emergency. Do not wait to see if the symptoms will go away. Get medical help right away. Call your local emergency services (911 in the [place] Do not drive yourself to the hospital. ?Summary ?Lymphedema is  swelling that is caused by the abnormal collection of lymph in the tissues under the skin. ?Lymph is fluid from the tissues in your body that is removed through the lymphatic system. This system collects and carries excess fluid, fats, proteins, and wastes from the tissues of the body to the bloodstream. ?Lymphedema causes swelling, pain, and redness in the affected area. This may include the trunk area, or an arm or leg. ?Treatment for this condition may depend on the cause of your lymphedema. Treatment may include treating the underlying cause, complete decongestive therapy (CDT), compression methods, or surgery. ?This information is not intended to replace advice given to you by your health care provider. Make sure you discuss any questions you have with your health care provider. ?Document Revised: 05/27/2020 Document Reviewed: 05/27/2020 ?Elsevier Patient Education ? [employer]. ? ?

## 2021-12-01 NOTE — Progress Notes (Signed)
? ?Subjective: ?CC:Dm ?PCP: Stacey Norlander, DO ?Stacey Summers is a 76 y.o. female presenting to clinic today for: ? ?1. Type 2 Diabetes with hypertension, hyperlipidemia w/ NASH and thrombocytopenia:  ?Compliant with all meds. Wants DM shoes sent to [facility].  Having some LE edema despite compliance with lasix.  She reports good urine output with lasix.   ? ?Last eye exam: needs ?Last foot exam: needs ?Last A1c:  ?Lab Results  ?Component Value Date  ? HGBA1C 6.8 04/05/2021  ? ?Nephropathy screen indicated?: UTD ?Last flu, zoster and/or pneumovax:  ?Immunization History  ?Administered Date(s) Administered  ? Hepb-cpg 07/19/2018, 08/20/2018  ? Influenza, High Dose Seasonal PF 08/03/2018  ? Influenza,inj,Quad PF,6+ Mos 05/28/2014, 06/16/2015, 06/10/2016, 08/05/2020  ? Influenza,inj,quad, With Preservative 05/15/2017, 08/03/2018  ? Influenza-Unspecified 04/24/2017  ? Pneumococcal Conjugate-13 07/21/2014  ? Pneumococcal Polysaccharide-23 09/21/2015  ? Tdap 07/07/2009  ? Zoster Recombinat (Shingrix) 04/24/2017, 04/29/2017, 07/11/2017, 07/12/2017  ? ? ?ROS: no chest pain.  Having LE edema.  Having left knee pain after fall that has not improved.  Would like to see her ortho at [facility], who did her other knee.  No h/o foot ulcer but does have neuropathy. ? ?ROS: Per HPI ? ?Allergies  ?Allergen Reactions  ? Latex   ?  "my hands feels numb"  ? ?Past Medical History:  ?Diagnosis Date  ? Allergy   ? seasonal  ? Anxiety   ? Arthritis   ? hands, back   ? B12 deficiency 03/31/2021  ? Cataract   ? surgery to remove  ? Chronic back pain   ? lower back   ? Cirrhosis of liver without ascites ([place])   ? recent dx 07/11/18  ? COPD (chronic obstructive pulmonary disease) ([place])   ? no inhaler  ? Diabetes mellitus without complication ([place])   ? DJD (degenerative joint disease)   ? Fibromyalgia   ? Genetic testing 03/08/2017  ? [name] underwent genetic counseling and testing for hereditary cancer syndromes on  02/21/2017. Her results were negative for mutations in all 83 genes analyzed by [facility]'s 83-gene Multi-Cancers Panel. Genes analyzed include: ALK, APC, ATM, AXIN2, BAP1, BARD1, BLM, BMPR1A, BRCA1, BRCA2, BRIP1, CASR, CDC73, CDH1, CDK4, CDKN1B, CDKN1C, CDKN2A, CEBPA, CHEK2, CTNNA1, DICER1, DIS3L2, EGFR, EPCAM  ? GERD (gastroesophageal reflux disease)   ? 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 History of abnormal mammogram   ? Hyperlipidemia   ? Hypertension   ? Irregular heart beat   ? Neuromuscular disorder ([place])   ? fibromyalgia  ? Sleep apnea   ? cpap some  ? SVD (spontaneous vaginal delivery)   ? x 2  ? Tubular adenoma of colon 05/09/12  ? "innumerable number" of flat polyps  ? ? ?Current Outpatient Medications:  ?  albuterol (VENTOLIN HFA) 108 (90 Base) MCG/ACT inhaler, Inhale 2 puffs into the lungs every 6 (six) hours as needed for wheezing or shortness of breath., Disp: 18 g, Rfl: 0 ?  ALPRAZolam (XANAX) 0.25 MG tablet, alprazolam 0.25 mg tablet, Disp: , Rfl:  ?  amLODipine (NORVASC) 5 MG tablet, amlodipine 5 mg tablet, Disp: , Rfl:  ?  atorvastatin (LIPITOR) 20 MG tablet, Take 1 tablet (20 mg total) by mouth daily., Disp: 90 tablet, Rfl: 3 ?  benazepril (LOTENSIN) 20 MG tablet, Take 1 tablet by mouth once daily, Disp: 90 tablet, Rfl: 1 ?  Biotin 5000 MCG SUBL, Place under the tongue., Disp: , Rfl:  ?  blood  glucose meter kit and supplies KIT, Dispense based on patient and insurance preference. Use up to four times daily as directed. (FOR ICD-9 250.00, 250.01)., Disp: 1 each, Rfl: 10 ?  Blood Glucose Monitoring Suppl (ONETOUCH VERIO FLEX SYSTEM) w/Device KIT, Check Bs up to 4 times daily Dx E11.9,, Disp: 1 kit, Rfl: 0 ?  Cholecalciferol (VITAMIN D3) 5000 units CAPS, Take 5,000 Units by mouth daily., Disp: , Rfl:  ?  cyclobenzaprine (FLEXERIL) 5 MG tablet, TAKE 1 TABLET BY MOUTH THREE TIMES DAILY AS NEEDED FOR MUSCLE SPASMS (NEEDS APPOINTMENT), Disp: 30 tablet, Rfl: 0 ?  diltiazem (CARDIZEM CD) 120 MG  24 hr capsule, Take 1 capsule (120 mg total) by mouth daily., Disp: 90 capsule, Rfl: 1 ?  ferrous sulfate 325 (65 FE) MG EC tablet, Take 1 tablet (325 mg total) by mouth daily., Disp: 30 tablet, Rfl: 0 ?  fluticasone (FLONASE) 50 MCG/ACT nasal spray, USE 1 SPRAY(S) IN EACH NOSTRIL IN THE MORNING AND AT BEDTIME, Disp: 48 g, Rfl: 0 ?  furosemide (LASIX) 40 MG tablet, TAKE 1 TABLET BY MOUTH IN THE MORNING AND 1/2 (ONE-HALF) ONCE DAILY AT  1PM, Disp: 135 tablet, Rfl: 0 ?  glucose blood (ONETOUCH VERIO) test strip, CHECK GLUCOSE UP TO 4 TIMES DAILY Dx E11.69, Disp: 400 each, Rfl: 3 ?  lactulose (CHRONULAC) 10 GM/15ML solution, TAKE 15 ML BY MOUTH  THREE TIMES DAILY AS NEEDED, Disp: 946 mL, Rfl: 0 ?  LORazepam (ATIVAN) 0.5 MG tablet, Take 0.5-1 tablets (0.25-0.5 mg total) by mouth 2 (two) times daily as needed for anxiety., Disp: 30 tablet, Rfl: 1 ?  meclizine (ANTIVERT) 25 MG tablet, Take 1 tablet (25 mg total) by mouth 3 (three) times daily as needed for dizziness., Disp: 30 tablet, Rfl: 0 ?  metFORMIN (GLUCOPHAGE) 500 MG tablet, Take 1 tablet (500 mg total) by mouth daily with breakfast., Disp: 90 tablet, Rfl: 1 ?  omeprazole (PRILOSEC) 40 MG capsule, omeprazole 40 mg capsule,delayed release  TAKE 1 CAPSULE BY MOUTH ONCE DAILY, Disp: , Rfl:  ?  ondansetron (ZOFRAN-ODT) 4 MG disintegrating tablet, Take 1 tablet (4 mg total) by mouth every 6 (six) hours as needed for nausea or vomiting., Disp: 20 tablet, Rfl: 0 ?  ONETOUCH DELICA LANCETS 78G MISC, USE ONE LANCET TO CHECK GLUCOSE ONCE DAILY AND AS NEEDED, Disp: 100 each, Rfl: 0 ?  rifaximin (XIFAXAN) 550 MG TABS tablet, Take 1 tablet (550 mg total) by mouth 2 (two) times daily., Disp: 180 tablet, Rfl: 3 ?  traMADol (ULTRAM) 50 MG tablet, Take 1 tablet (50 mg total) by mouth every 12 (twelve) hours as needed for moderate pain., Disp: 30 tablet, Rfl: 2 ?  valACYclovir (VALTREX) 1000 MG tablet, valacyclovir 1 gram tablet, Disp: , Rfl:  ?Social History  ? ?Socioeconomic  History  ? Marital status: Married  ?  Spouse name: Stacey Summers "goes by Stacey Summers"  ? Number of children: 3  ? Years of education: 32  ? Highest education level: GED or equivalent  ?Occupational History  ? Occupation: retired  ?Tobacco Use  ? Smoking status: Former  ?  Packs/day: 1.00  ?  Years: 3.00  ?  Pack years: 3.00  ?  Types: Cigarettes  ?  Quit date: 08/15/1969  ?  Years since quitting: 52.3  ? Smokeless tobacco: Never  ?Vaping Use  ? Vaping Use: Never used  ?Substance and Sexual Activity  ? Alcohol use: No  ?  Alcohol/week: 0.0 standard drinks  ? Drug use: No  ?  Sexual activity: Not Currently  ?Other Topics Concern  ? Not on file  ?Social History Narrative  ? Not on file  ? ?Social Determinants of Health  ? ?Financial Resource Strain: Low Risk   ? Difficulty of Paying Living Expenses: Not very hard  ?Food Insecurity: No Food Insecurity  ? Worried About Charity fundraiser in the Last Year: Never true  ? Ran Out of Food in the Last Year: Never true  ?Transportation Needs: No Transportation Needs  ? Lack of Transportation (Medical): No  ? Lack of Transportation (Non-Medical): No  ?Physical Activity: Inactive  ? Days of Exercise per Week: 0 days  ? Minutes of Exercise per Session: 0 min  ?Stress: No Stress Concern Present  ? Feeling of Stress : Only a little  ?Social Connections: Socially Integrated  ? Frequency of Communication with Friends and Family: More than three times a week  ? Frequency of Social Gatherings with Friends and Family: More than three times a week  ? Attends Religious Services: More than 4 times per year  ? Active Member of Clubs or Organizations: Yes  ? Attends Archivist Meetings: More than 4 times per year  ? Marital Status: Married  ?Intimate Partner Violence: Not At Risk  ? Fear of Current or Ex-Partner: No  ? Emotionally Abused: No  ? Physically Abused: No  ? Sexually Abused: No  ? ?Family History  ?Problem Relation Age of Onset  ? Prostate cancer Father   ?     d.89 diagnosed in  late-60s  ? Colon cancer Father   ?     diagnosed at 45  ? Heart failure Mother   ?     d.37  ? Heart disease Mother   ? Colon polyps Brother   ?     polyposis. Underwent partial colectomy at age 78.  ? Spina bifid

## 2021-12-10 ENCOUNTER — Other Ambulatory Visit: Payer: Self-pay | Admitting: Gastroenterology

## 2021-12-14 ENCOUNTER — Other Ambulatory Visit: Payer: Self-pay | Admitting: Family Medicine

## 2021-12-14 DIAGNOSIS — Z1231 Encounter for screening mammogram for malignant neoplasm of breast: Secondary | ICD-10-CM

## 2021-12-15 ENCOUNTER — Ambulatory Visit
Admission: RE | Admit: 2021-12-15 | Discharge: 2021-12-15 | Disposition: A | Discharge status: Home / Self Care | Payer: PPO | Source: Ambulatory Visit | Attending: Family Medicine | Admitting: Family Medicine

## 2021-12-15 ENCOUNTER — Inpatient Hospital Stay (HOSPITAL_COMMUNITY): Payer: PPO | Attending: Hematology

## 2021-12-15 ENCOUNTER — Other Ambulatory Visit: Payer: Self-pay | Admitting: Family Medicine

## 2021-12-15 VITALS — BP 163/71 | HR 50 | Temp 98.1°F | Resp 18

## 2021-12-15 DIAGNOSIS — Z79899 Other long term (current) drug therapy: Secondary | ICD-10-CM | POA: Insufficient documentation

## 2021-12-15 DIAGNOSIS — Z1231 Encounter for screening mammogram for malignant neoplasm of breast: Secondary | ICD-10-CM

## 2021-12-15 DIAGNOSIS — E538 Deficiency of other specified B group vitamins: Secondary | ICD-10-CM | POA: Insufficient documentation

## 2021-12-15 DIAGNOSIS — R600 Localized edema: Secondary | ICD-10-CM

## 2021-12-15 MED ORDER — CYANOCOBALAMIN 1000 MCG/ML IJ SOLN
1000.0000 ug | Freq: Once | INTRAMUSCULAR | Status: AC
  Administered 2021-12-15: 1000 ug via INTRAMUSCULAR
  Filled 2021-12-15: qty 1

## 2021-12-15 NOTE — Progress Notes (Signed)
Stacey Summers presents today for injection per the provider's orders.  B12  administration without incident; injection site WNL; see MAR for injection details.  Patient tolerated procedure well and without incident.  No questions or complaints noted at this time.  ? ?Discharged from clinic via wheelchair in stable condition. Alert and oriented x 3. F/U with [facility] as scheduled.   ?

## 2021-12-15 NOTE — Patient Instructions (Signed)
[facility]  Discharge Instructions: ?Thank you for choosing [facility] to provide your oncology and hematology care.  ?If you have a lab appointment with the [facility], please come in thru the Main Entrance and check in at the main information desk. ? ?Wear comfortable clothing and clothing appropriate for easy access to any Portacath or PICC line.  ? ?We strive to give you quality time with your provider. You may need to reschedule your appointment if you arrive late (15 or more minutes).  Arriving late affects you and other patients whose appointments are after yours.  Also, if you miss three or more appointments without notifying the office, you may be dismissed from the clinic at the provider?s discretion.    ?  ?For prescription refill requests, have your pharmacy contact our office and allow 72 hours for refills to be completed.   ? ?Today you received B12 injection ?  ? ? ?BELOW ARE SYMPTOMS THAT SHOULD BE REPORTED IMMEDIATELY: ?*FEVER GREATER THAN 100.4 F (38 ?C) OR HIGHER ?*CHILLS OR SWEATING ?*NAUSEA AND VOMITING THAT IS NOT CONTROLLED WITH YOUR NAUSEA MEDICATION ?*UNUSUAL SHORTNESS OF BREATH ?*UNUSUAL BRUISING OR BLEEDING ?*URINARY PROBLEMS (pain or burning when urinating, or frequent urination) ?*BOWEL PROBLEMS (unusual diarrhea, constipation, pain near the anus) ?TENDERNESS IN MOUTH AND THROAT WITH OR WITHOUT PRESENCE OF ULCERS (sore throat, sores in mouth, or a toothache) ?UNUSUAL RASH, SWELLING OR PAIN  ?UNUSUAL VAGINAL DISCHARGE OR ITCHING  ? ?Items with * indicate a potential emergency and should be followed up as soon as possible or go to the Emergency Department if any problems should occur. ? ?Please show the CHEMOTHERAPY ALERT CARD or IMMUNOTHERAPY ALERT CARD at check-in to the Emergency Department and triage nurse. ? ?Should you have questions after your visit or need to cancel or reschedule your appointment, please contact [facility] [phone]   and follow the prompts.  Office hours are 8:00 a.m. to 4:30 p.m. Monday - Friday. Please note that voicemails left after 4:00 p.m. may not be returned until the following business day.  We are closed weekends and major holidays. You have access to a nurse at all times for urgent questions. Please call the main number to the clinic [phone] and follow the prompts. ? ?For any non-urgent questions, you may also contact your provider using MyChart. We now offer e-Visits for anyone 72 and older to request care online for non-urgent symptoms. For details visit [email or web address]. ?  ?Also download the MyChart app! Go to the app store, search "MyChart", open the app, select [facility], and log in with your MyChart username and password. ? ?Due to Covid, a mask is required upon entering the hospital/clinic. If you do not have a mask, one will be given to you upon arrival. For doctor visits, patients may have 1 support person aged 20 or older with them. For treatment visits, patients cannot have anyone with them due to current Covid guidelines and our immunocompromised population.  ?

## 2021-12-17 ENCOUNTER — Other Ambulatory Visit: Payer: Self-pay

## 2021-12-17 MED ORDER — RIFAXIMIN 550 MG PO TABS: 550.0000 mg | ORAL_TABLET | Freq: Two times a day (BID) | ORAL | 0 refills | Status: AC

## 2021-12-18 DIAGNOSIS — E1140 Type 2 diabetes mellitus with diabetic neuropathy, unspecified: Secondary | ICD-10-CM | POA: Diagnosis not present

## 2021-12-21 ENCOUNTER — Other Ambulatory Visit: Payer: Self-pay

## 2021-12-28 ENCOUNTER — Other Ambulatory Visit: Payer: Self-pay | Admitting: Cardiovascular Disease

## 2021-12-28 ENCOUNTER — Other Ambulatory Visit: Payer: Self-pay | Admitting: Family Medicine

## 2021-12-28 DIAGNOSIS — I152 Hypertension secondary to endocrine disorders: Secondary | ICD-10-CM

## 2022-01-07 ENCOUNTER — Ambulatory Visit (INDEPENDENT_AMBULATORY_CARE_PROVIDER_SITE_OTHER): Payer: PPO | Admitting: Cardiovascular Disease

## 2022-01-07 ENCOUNTER — Encounter: Payer: Self-pay | Admitting: Cardiovascular Disease

## 2022-01-07 VITALS — BP 145/60 | HR 65 | Ht 62.0 in | Wt 244.6 lb

## 2022-01-07 DIAGNOSIS — G4733 Obstructive sleep apnea (adult) (pediatric): Secondary | ICD-10-CM

## 2022-01-07 DIAGNOSIS — K7581 Nonalcoholic steatohepatitis (NASH): Secondary | ICD-10-CM | POA: Diagnosis not present

## 2022-01-07 DIAGNOSIS — R600 Localized edema: Secondary | ICD-10-CM | POA: Diagnosis not present

## 2022-01-07 DIAGNOSIS — R002 Palpitations: Secondary | ICD-10-CM

## 2022-01-07 DIAGNOSIS — I50810 Right heart failure, unspecified: Secondary | ICD-10-CM

## 2022-01-07 DIAGNOSIS — K7460 Unspecified cirrhosis of liver: Secondary | ICD-10-CM | POA: Diagnosis not present

## 2022-01-07 DIAGNOSIS — I10 Essential (primary) hypertension: Secondary | ICD-10-CM

## 2022-01-07 DIAGNOSIS — M170 Bilateral primary osteoarthritis of knee: Secondary | ICD-10-CM | POA: Diagnosis not present

## 2022-01-07 DIAGNOSIS — Z79899 Other long term (current) drug therapy: Secondary | ICD-10-CM

## 2022-01-07 DIAGNOSIS — E7800 Pure hypercholesterolemia, unspecified: Secondary | ICD-10-CM

## 2022-01-07 DIAGNOSIS — M25562 Pain in left knee: Secondary | ICD-10-CM | POA: Diagnosis not present

## 2022-01-07 MED ORDER — SPIRONOLACTONE 25 MG PO TABS: 25.0000 mg | ORAL_TABLET | Freq: Every day | ORAL | 3 refills | Status: DC

## 2022-01-07 MED ORDER — FUROSEMIDE 40 MG PO TABS: ORAL_TABLET | ORAL | 2 refills | Status: DC

## 2022-01-07 NOTE — Progress Notes (Signed)
Cardiology office note   Date:  01/07/2022   ID:  NUMA HEATWOLE, DOB [birth year], MRN [id number]   PCP:  Janora Norlander, DO  Cardiologist:  Sanda Klein, MD  Electrophysiologist:  None   Evaluation Performed:  Follow-Up Visit  Chief Complaint: Edema  History of Present Illness:    Stacey Summers is a 76 y.o. female with Morbid obesity, obstructive sleep apnea, essential hypertension, hyperlipidemia and chronic leg edema.  She has cirrhosis due to NASH.    She has a history of palpitations that responded reasonably well to treatment with diltiazem.  She continues taking amlodipine as well as diltiazem, unfortunately.  Recently her biggest problem has been edema.  She has gained about 20 pounds, likely all fluid.  I year ago her weight was consistently in the 213-222 pound range, now she weighs 240 pounds.    She reports that she is careful not to add salt to her foods or eats salty foods and denies frequent intake of deli meats or canned soups.  She does not have orthopnea or PND.  When she wakes up in the morning the edema is largely gone, but she developed severe edema before the end of the day.  She has not had any weeping wounds, ulcerations, blisters.  Compliance with CPAP is spotty.  She continues have a lot of problems with back pain.  She has tried wearing knee-high compression stockings but these have "cut in" beneath her knees and are uncomfortable.  Most recent labs from early April show that she remains thrombocytopenic with a platelet count of 69,000.  She has not had bleeding.  Hemoglobin was normal at 13.3.  Most recent creatinine was normal at 0.84.  Potassium was 4.1.  She is concerned about possible "blood clots" although I am not sure why this is specifically her concern.  Her mother died suddenly without clear explanation by postmortem study at age 73.  Her brother, who had multiple health problems diabetes sleep at age 28 not long ago.  She thinks maybe  they had clots.   Past Medical History:  Diagnosis Date   Allergy    seasonal   Anxiety    Arthritis    hands, back    B12 deficiency 03/31/2021   Cataract    surgery to remove   Chronic back pain    lower back    Cirrhosis of liver without ascites ([place])    recent dx 07/11/18   Closed fracture of right tibial plateau 01/31/2018   Complex tear of medial meniscus of right knee 03/27/2018   COPD (chronic obstructive pulmonary disease) ([place])    no inhaler   Diabetes mellitus without complication ([facility])    DJD (degenerative joint disease)    Fibromyalgia    Genetic testing 03/08/2017   Ms. Boberg underwent genetic counseling and testing for hereditary cancer syndromes on 02/21/2017. Her results were negative for mutations in all 83 genes analyzed by [facility]'s 83-gene Multi-Cancers Panel. 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   History of abnormal mammogram    History of arthroplasty of right knee 07/06/2018   Hyperlipidemia    Hypertension    Irregular heart beat    Neuromuscular disorder ([facility])    fibromyalgia   Sleep apnea  cpap some   Stress fracture of right tibia 03/27/2018   SVD (spontaneous vaginal delivery)    x 2   Tubular adenoma of colon 05/09/12   "innumerable number" of flat polyps   Past Surgical History:  Procedure Laterality Date   CARPAL TUNNEL RELEASE Left 10/20/14   [facility]   CATARACT EXTRACTION, BILATERAL     CESAREAN SECTION  1977   CHOLECYSTECTOMY  1990   COLONOSCOPY     IR RADIOLOGIST EVAL & MGMT  07/16/2019   KNEE ARTHROSCOPY WITH SUBCHONDROPLASTY Right 03/27/2018   Procedure: Right knee arthroscopic partial medial meniscus repair versus menisectomy with medial tibia subchondroplasty;  Surgeon: Nicholes Stairs, MD;  Location: [facility];  Service: Orthopedics;  Laterality: Right;  120 mins   LARYNX SURGERY     POLYP REMOVAL   MULTIPLE TOOTH EXTRACTIONS     POLYPECTOMY     vocal cord   TONSILLECTOMY  1965   TUBAL LIGATION     TUMOR REMOVAL     benign fatty tumors on abdomen   UPPER GASTROINTESTINAL ENDOSCOPY       Current Meds  Medication Sig   atorvastatin (LIPITOR) 20 MG tablet Take 1 tablet (20 mg total) by mouth daily.   benazepril (LOTENSIN) 20 MG tablet Take 1 tablet by mouth once daily   Biotin 5000 MCG SUBL Place under the tongue.   blood glucose meter kit and supplies KIT Dispense based on patient and insurance preference. Use up to four times daily as directed. (FOR ICD-9 250.00, 250.01).   Blood Glucose Monitoring Suppl (ONETOUCH VERIO FLEX SYSTEM) w/Device KIT Check Bs up to 4 times daily Dx E11.9,   Cholecalciferol (VITAMIN D3) 5000 units CAPS Take 5,000 Units by mouth daily.   ferrous sulfate 325 (65 FE) MG EC tablet Take 1 tablet (325 mg total) by mouth daily.   fluticasone (FLONASE) 50 MCG/ACT nasal spray USE 1 SPRAY(S) IN EACH NOSTRIL IN THE MORNING AND AT BEDTIME   glucose blood (ONETOUCH VERIO) test strip CHECK GLUCOSE UP TO 4 TIMES DAILY Dx E11.69   lactulose (CHRONULAC) 10 GM/15ML solution TAKE 15 ML BY MOUTH  THREE TIMES DAILY AS NEEDED   metFORMIN (GLUCOPHAGE) 500 MG tablet Take 1 tablet (500 mg total) by mouth daily with breakfast.   ONETOUCH DELICA LANCETS 16L MISC USE ONE LANCET TO CHECK GLUCOSE ONCE DAILY AND AS NEEDED   rifaximin (XIFAXAN) 550 MG TABS tablet Take 1 tablet (550 mg total) by mouth 2 (two) times daily.   spironolactone (ALDACTONE) 25 MG tablet Take 1 tablet (25 mg total) by mouth daily.   [DISCONTINUED] amLODipine (NORVASC) 5 MG tablet amlodipine 5 mg tablet   [DISCONTINUED] furosemide (LASIX) 40 MG tablet TAKE 1 TABLET BY MOUTH IN THE MORNING AND 1/2 (ONE-HALF) TABLET AT 1 PM     Allergies:   Latex   Social History   Tobacco Use   Smoking  status: Former    Packs/day: 1.00    Years: 3.00    Pack years: 3.00    Types: Cigarettes    Quit date: 08/15/1969    Years since quitting: 52.4   Smokeless tobacco: Never  Vaping Use   Vaping Use: Never used  Substance Use Topics   Alcohol use: No    Alcohol/week: 0.0 standard drinks   Drug use: No     Family Hx: The patient's family history includes Colon cancer in her father and paternal uncle; Colon polyps in her brother; Colon polyps (age of  onset: 34) in her son; Heart attack (age of onset: 20) in her son; Heart disease in her mother; Heart failure in her maternal grandmother, maternal uncle, and mother; Kidney cancer in her cousin; Leukemia in her maternal aunt; Other (age of onset: 39) in an other family member; Prostate cancer in her father; Spina bifida in her brother; Stomach cancer in her paternal aunt. There is no history of Esophageal cancer, Rectal cancer, or Breast cancer.  ROS:   Please see the history of present illness.    All other systems are reviewed and are negative. Prior CV studies:   The following studies were reviewed today:  Echocardiogram from 2014 was a normal study other than mild left atrial enlargement.  EF was 55-60%.  Labs/Other Tests and Data Reviewed:    EKG: ECG ordered today and personally viewed shows sinus bradycardia  with a couple of interpolated PVCs, QS pattern in leads V1-V2, QTc 468 ms, without ischemic repolarization abnormalities.  11/15/2021: ALT 28; BUN 24; Creatinine, Ser 0.84; Hemoglobin 13.3; Platelets 69.0; Potassium 4.1; Sodium 143 hemoglobin A1c 5.5%, INR 1.4, bilirubin 2.7, AST 48, ALT 28, alkaline phosphatase 121, albumin 3.1   Recent Labs:   Recent Lipid Panel Lab Results  Component Value Date/Time   CHOL 103 06/28/2021 11:06 AM   CHOL 162 12/20/2012 10:44 AM   TRIG 55 06/28/2021 11:06 AM   TRIG 91 12/25/2014 10:20 AM   TRIG 83 12/20/2012 10:44 AM   HDL 48 06/28/2021 11:06 AM   HDL 42 12/25/2014 10:20 AM   HDL 42  12/20/2012 10:44 AM   CHOLHDL 2.1 06/28/2021 11:06 AM   LDLCALC 42 06/28/2021 11:06 AM   LDLCALC 59 01/29/2014 10:31 AM   LDLCALC 103 (H) 12/20/2012 10:44 AM    Wt Readings from Last 3 Encounters:  01/07/22 244 lb 9.6 oz (110.9 kg)  12/01/21 235 lb (106.6 kg)  11/15/21 231 lb 4 oz (104.9 kg)     Objective:    Vital Signs:  BP (!) 145/60 (BP Location: Left Arm, Patient Position: Sitting, Cuff Size: Large)   Pulse 65   Ht 5' 2"  (1.575 m)   Wt 244 lb 9.6 oz (110.9 kg)   SpO2 96%   BMI 44.74 kg/m     General: Alert, oriented x3, no distress, morbidly obese Head: no evidence of trauma, PERRL, EOMI, no exophtalmos or lid lag, no myxedema, no xanthelasma; normal ears, nose and oropharynx Neck: normal jugular venous pulsations, but there is prominent hepatojugular reflux; brisk carotid pulses without delay and no carotid bruits Chest: clear to auscultation, no signs of consolidation by percussion or palpation, normal fremitus, symmetrical and full respiratory excursions Cardiovascular: normal position and quality of the apical impulse, regular rhythm, normal first and second heart sounds, no murmurs, rubs or gallops Abdomen: no tenderness or distention, no masses by palpation, no abnormal pulsatility or arterial bruits, normal bowel sounds, no hepatosplenomegaly Extremities: no clubbing, cyanosis; she has 3+ pitting edema of both lower extremities all the way up to her knees; 2+ radial, ulnar and brachial pulses bilaterally; 2+ right femoral, posterior tibial and dorsalis pedis pulses; 2+ left femoral, posterior tibial and dorsalis pedis pulses; no subclavian or femoral bruits Neurological: grossly nonfocal Psych: Normal mood and affect   ASSESSMENT & PLAN:    Edema: Suspect this is multifactorial.  Cirrhosis is likely contributory.  She also probably has some degree of right heart failure due to inconsistent treatment for her sleep apnea.  She has hepatojugular reflux.  She  does not  have any signs or symptoms of left heart failure.  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  She is to start a lymphedema program, but I think she needs diuresis as well.  Repeat echocardiogram.  Increase furosemide to 80 mg in the morning and 40 mg in the afternoon and add spironolactone 25 mg daily.  Recheck labs in about 3 weeks.  Wear thigh-high compression stockings.  Keep legs elevated whenever possible. OSA: Strongly encouraged 100% compliance with CPAP.  Discussed the mechanism of hypoxia related pulmonary artery hypertension leading to right heart failure. Palpitations: Diltiazem is probably contributing to the edema.  There is no reason for her to also take amlodipine and we will stop this. HTN: Blood pressure is borderline elevated today.  We will be stopping amlodipine but adding spironolactone.  Reevaluate blood pressure in 2 or 3 weeks, when we also check her labs. Cirrhosis: Presumed to be secondary to NASH.  Her most recent labs do show some parenchymal insufficiency with low albumin level, elevated INR and bilirubin.  No clear evidence of ascites or other signs of portal hypertension at this time, other than her thrombocytopenia. HLP: Chronically low HDL, likely related to severe obesity.  LDL in target range. OSA: I encouraged her to use the CPAP 100% of the time. Severe obesity: Likely the major underlying cause for her dyslipidemia, hypertension, sleep apnea andNASH.   COVID-19 Education: The signs and symptoms of COVID-19 were discussed with the patient and how to seek care for testing (follow up with PCP or arrange E-visit).  The importance of social distancing was discussed today.  Time:   Today, I have spent 21 minutes with the patient with telehealth technology discussing the above problems.     Medication Adjustments/Labs and Tests Ordered: Current medicines are reviewed at length with the patient  today.  Concerns regarding medicines are outlined above.   Tests Ordered: Orders Placed This Encounter  Procedures   Brain natriuretic peptide   Comprehensive metabolic panel   EKG [id number]   ECHOCARDIOGRAM COMPLETE    Medication Changes: Meds ordered this encounter  Medications   spironolactone (ALDACTONE) 25 MG tablet    Sig: Take 1 tablet (25 mg total) by mouth daily.    Dispense:  90 tablet    Refill:  3   furosemide (LASIX) 40 MG tablet    Sig: Take 2 tablet ( total 80 mg)in the morning  and 40 mg in the afternoon    Dispense:  270 tablet    Refill:  2    Discontinue previous dose    Follow Up:  Virtual Visit or In Person  12 months  Signed, Sanda Klein, MD  01/07/2022 12:40 PM    [facility]

## 2022-01-07 NOTE — Patient Instructions (Signed)
Medication Instructions:   Stop taking Amlodipine    Start taking Spironolactone  25 mg  one tablet daily   Increase Lasix ( furosemide) 80 mg in the morning  and 40 mg in the afternoon    Weight yourself daily  -first  thing in the morning - if you are 3 lbs heavier - this is excess fluid weight  *If you need a refill on your cardiac medications before your next appointment, please call your pharmacy*   Lab Work: in 3 weeks  CMP BNP If you have labs (blood work) drawn today and your tests are completely normal, you will receive your results only by: [facility] (if you have MyChart) OR A paper copy in the mail If you have any lab test that is abnormal or we need to change your treatment, we will call you to review the results.   Testing/Procedures: Will be schedule at [place] has requested that you have an echocardiogram. Echocardiography is a painless test that uses sound waves to create images of your heart. It provides your doctor with information about the size and shape of your heart and how well your heart's chambers and valves are working. This procedure takes approximately one hour. There are no restrictions for this procedure.    Follow-Up: At [facility], you and your health needs are our priority.  As part of our continuing mission to provide you with exceptional heart care, we have created designated Provider Care Teams.  These Care Teams include your primary Cardiologist (physician) and Advanced Practice Providers (APPs -  Physician Assistants and Nurse Practitioners) who all work together to provide you with the care you need, when you need it.     Your next appointment:   6 week(s)  The format for your next appointment:   In Person  Provider:   Sanda Klein, MD    Other Instructions   Use your  C-PAP  machine nightly.  Elevated legs when you are sitting  Wear compression stocking   recommends you  purchase some  try thigh- hi   compression  socks/hose from [employer] in [place] You do not need an prescription to purchase the items.  Address  [address] [place], [place] [address]  Phone  [phone]   Compression   strength    8-15 mmHg 15-20 mmHg                              20-30 mmHg  30-40 mmHg.

## 2022-01-12 ENCOUNTER — Encounter (HOSPITAL_COMMUNITY): Payer: Self-pay

## 2022-01-12 ENCOUNTER — Inpatient Hospital Stay (HOSPITAL_COMMUNITY): Payer: PPO

## 2022-01-12 VITALS — BP 143/55 | HR 63 | Temp 97.7°F | Resp 18

## 2022-01-12 DIAGNOSIS — E538 Deficiency of other specified B group vitamins: Secondary | ICD-10-CM

## 2022-01-12 MED ORDER — CYANOCOBALAMIN 1000 MCG/ML IJ SOLN
1000.0000 ug | Freq: Once | INTRAMUSCULAR | Status: AC
  Administered 2022-01-12: 1000 ug via INTRAMUSCULAR
  Filled 2022-01-12: qty 1

## 2022-01-12 NOTE — Progress Notes (Signed)
Patient tolerated injection with no complaints voiced.  Site clean and dry with no bruising or swelling noted at site.  See MAR for details.  Band aid applied.  Patient stable during and after injection.  Vss with discharge and left in satisfactory condition with no s/s of distress noted.

## 2022-01-12 NOTE — Patient Instructions (Signed)
Time  Discharge Instructions: Thank you for choosing [facility] to provide your oncology and hematology care.  If you have a lab appointment with the [facility], please come in thru the Main Entrance and check in at the main information desk.  Wear comfortable clothing and clothing appropriate for easy access to any Portacath or PICC line.   We strive to give you quality time with your provider. You may need to reschedule your appointment if you arrive late (15 or more minutes).  Arriving late affects you and other patients whose appointments are after yours.  Also, if you miss three or more appointments without notifying the office, you may be dismissed from the clinic at the provider's discretion.      For prescription refill requests, have your pharmacy contact our office and allow 72 hours for refills to be completed.    Today you received the following chemotherapy and/or immunotherapy agents vitamin b12.       To help prevent nausea and vomiting after your treatment, we encourage you to take your nausea medication as directed.  BELOW ARE SYMPTOMS THAT SHOULD BE REPORTED IMMEDIATELY: *FEVER GREATER THAN 100.4 F (38 C) OR HIGHER *CHILLS OR SWEATING *NAUSEA AND VOMITING THAT IS NOT CONTROLLED WITH YOUR NAUSEA MEDICATION *UNUSUAL SHORTNESS OF BREATH *UNUSUAL BRUISING OR BLEEDING *URINARY PROBLEMS (pain or burning when urinating, or frequent urination) *BOWEL PROBLEMS (unusual diarrhea, constipation, pain near the anus) TENDERNESS IN MOUTH AND THROAT WITH OR WITHOUT PRESENCE OF ULCERS (sore throat, sores in mouth, or a toothache) UNUSUAL RASH, SWELLING OR PAIN  UNUSUAL VAGINAL DISCHARGE OR ITCHING   Items with * indicate a potential emergency and should be followed up as soon as possible or go to the Emergency Department if any problems should occur.  Please show the CHEMOTHERAPY ALERT CARD or IMMUNOTHERAPY ALERT CARD at check-in to the Emergency  Department and triage nurse.  Should you have questions after your visit or need to cancel or reschedule your appointment, please contact [facility] [phone]  and follow the prompts.  Office hours are 8:00 a.m. to 4:30 p.m. Monday - Friday. Please note that voicemails left after 4:00 p.m. may not be returned until the following business day.  We are closed weekends and major holidays. You have access to a nurse at all times for urgent questions. Please call the main number to the clinic [phone] and follow the prompts.  For any non-urgent questions, you may also contact your provider using MyChart. We now offer e-Visits for anyone 85 and older to request care online for non-urgent symptoms. For details visit [email or web address].   Also download the MyChart app! Go to the app store, search "MyChart", open the app, select [facility], and log in with your MyChart username and password.  Due to Covid, a mask is required upon entering the hospital/clinic. If you do not have a mask, one will be given to you upon arrival. For doctor visits, patients may have 1 support person aged 32 or older with them. For treatment visits, patients cannot have anyone with them due to current Covid guidelines and our immunocompromised population.

## 2022-01-13 ENCOUNTER — Telehealth: Payer: Self-pay | Admitting: *Deleted

## 2022-01-13 NOTE — Telephone Encounter (Signed)
As of 12/28/2021 patient is approved for PAP BAUSCH until 08/14/2022. Patient ID is  [id number]. Sent approval to be scanned in. Pt and daughter aware.

## 2022-01-17 ENCOUNTER — Ambulatory Visit (HOSPITAL_COMMUNITY): Payer: PPO | Admitting: Physical Therapy

## 2022-01-18 ENCOUNTER — Ambulatory Visit (HOSPITAL_COMMUNITY)
Admission: RE | Admit: 2022-01-18 | Discharge: 2022-01-18 | Disposition: A | Discharge status: Home / Self Care | Payer: PPO | Source: Ambulatory Visit | Attending: Cardiovascular Disease | Admitting: Cardiovascular Disease

## 2022-01-18 DIAGNOSIS — Z79899 Other long term (current) drug therapy: Secondary | ICD-10-CM

## 2022-01-18 DIAGNOSIS — I10 Essential (primary) hypertension: Secondary | ICD-10-CM | POA: Diagnosis not present

## 2022-01-18 DIAGNOSIS — R600 Localized edema: Secondary | ICD-10-CM | POA: Diagnosis not present

## 2022-01-18 DIAGNOSIS — I50810 Right heart failure, unspecified: Secondary | ICD-10-CM | POA: Insufficient documentation

## 2022-01-18 LAB — ECHOCARDIOGRAM COMPLETE
AR max vel: 2.2 cm2
AV Area VTI: 2.42 cm2
AV Area mean vel: 2.26 cm2
AV Mean grad: 13.5 mmHg
AV Peak grad: 26.4 mmHg
Ao pk vel: 2.57 m/s
Area-P 1/2: 2.72 cm2
Calc EF: 66.4 %
MV VTI: 3.23 cm2
S' Lateral: 3.1 cm
Single Plane A2C EF: 64.2 %
Single Plane A4C EF: 67.8 %

## 2022-01-18 NOTE — Progress Notes (Signed)
*  PRELIMINARY RESULTS* Echocardiogram 2D Echocardiogram has been performed.  Stacey Summers 01/18/2022, 3:54 PM

## 2022-01-21 ENCOUNTER — Other Ambulatory Visit: Payer: PPO

## 2022-01-21 DIAGNOSIS — R600 Localized edema: Secondary | ICD-10-CM | POA: Diagnosis not present

## 2022-01-21 DIAGNOSIS — I50810 Right heart failure, unspecified: Secondary | ICD-10-CM | POA: Diagnosis not present

## 2022-01-22 LAB — COMPREHENSIVE METABOLIC PANEL
ALT: 43 IU/L — ABNORMAL HIGH (ref 0–32)
AST: 56 IU/L — ABNORMAL HIGH (ref 0–40)
Albumin/Globulin Ratio: 1.5 (ref 1.2–2.2)
Albumin: 3.1 g/dL — ABNORMAL LOW (ref 3.7–4.7)
Alkaline Phosphatase: 151 IU/L — ABNORMAL HIGH (ref 44–121)
BUN/Creatinine Ratio: 30 — ABNORMAL HIGH (ref 12–28)
BUN: 37 mg/dL — ABNORMAL HIGH (ref 8–27)
Bilirubin Total: 2.6 mg/dL — ABNORMAL HIGH (ref 0.0–1.2)
CO2: 29 mmol/L (ref 20–29)
Calcium: 9.4 mg/dL (ref 8.7–10.3)
Chloride: 95 mmol/L — ABNORMAL LOW (ref 96–106)
Creatinine, Ser: 1.24 mg/dL — ABNORMAL HIGH (ref 0.57–1.00)
Globulin, Total: 2.1 g/dL (ref 1.5–4.5)
Glucose: 289 mg/dL — ABNORMAL HIGH (ref 70–99)
Potassium: 4.8 mmol/L (ref 3.5–5.2)
Sodium: 137 mmol/L (ref 134–144)
Total Protein: 5.2 g/dL — ABNORMAL LOW (ref 6.0–8.5)
eGFR: 46 mL/min/{1.73_m2} — ABNORMAL LOW (ref >59)

## 2022-01-22 LAB — BRAIN NATRIURETIC PEPTIDE: BNP: 208.1 pg/mL — ABNORMAL HIGH (ref 0.0–100.0)

## 2022-01-31 ENCOUNTER — Other Ambulatory Visit: Payer: Self-pay | Admitting: Family Medicine

## 2022-01-31 ENCOUNTER — Other Ambulatory Visit: Payer: Self-pay | Admitting: Nurse Practitioner

## 2022-01-31 ENCOUNTER — Ambulatory Visit (HOSPITAL_COMMUNITY): Payer: PPO | Admitting: Physical Therapy

## 2022-01-31 DIAGNOSIS — G8929 Other chronic pain: Secondary | ICD-10-CM

## 2022-01-31 DIAGNOSIS — E1159 Type 2 diabetes mellitus with other circulatory complications: Secondary | ICD-10-CM

## 2022-01-31 DIAGNOSIS — R42 Dizziness and giddiness: Secondary | ICD-10-CM

## 2022-01-31 NOTE — Telephone Encounter (Signed)
Last OV 12/01/21. Last RF 01/22/18. Next OV no upcoming appt with PCP

## 2022-02-01 ENCOUNTER — Other Ambulatory Visit (HOSPITAL_COMMUNITY): Payer: PPO

## 2022-02-02 ENCOUNTER — Ambulatory Visit: Payer: PPO

## 2022-02-02 ENCOUNTER — Ambulatory Visit (HOSPITAL_COMMUNITY): Payer: PPO | Attending: Family Medicine | Admitting: Physical Therapy

## 2022-02-02 ENCOUNTER — Other Ambulatory Visit: Payer: Self-pay | Admitting: Gastroenterology

## 2022-02-02 DIAGNOSIS — K7460 Unspecified cirrhosis of liver: Secondary | ICD-10-CM | POA: Insufficient documentation

## 2022-02-02 DIAGNOSIS — R29898 Other symptoms and signs involving the musculoskeletal system: Secondary | ICD-10-CM | POA: Diagnosis not present

## 2022-02-02 DIAGNOSIS — R600 Localized edema: Secondary | ICD-10-CM | POA: Diagnosis not present

## 2022-02-02 DIAGNOSIS — K7581 Nonalcoholic steatohepatitis (NASH): Secondary | ICD-10-CM | POA: Diagnosis not present

## 2022-02-02 DIAGNOSIS — I890 Lymphedema, not elsewhere classified: Secondary | ICD-10-CM | POA: Diagnosis not present

## 2022-02-02 NOTE — Therapy (Signed)
OUTPATIENT PHYSICAL THERAPY ONCOLOGY EVALUATION  Patient Name: YNEZ EUGENIO MRN: [id number] DOB:01/02/1946, 76 y.o., female Today's Date: 02/02/2022   PT End of Session - 02/02/22 1606     Visit Number 1    Number of Visits 18    Date for PT Re-Evaluation 03/16/22    Authorization Type [employer].    Progress Note Due on Visit 10    PT Start Time 1335    PT Stop Time 1435    PT Time Calculation (min) 60 min    Equipment Utilized During Treatment Gait belt    Activity Tolerance Patient tolerated treatment well    Behavior During Therapy WFL for tasks assessed/performed             Past Medical History:  Diagnosis Date   Allergy    seasonal   Anxiety    Arthritis    hands, back    B12 deficiency 03/31/2021   Cataract    surgery to remove   Chronic back pain    lower back    Cirrhosis of liver without ascites ([place])    recent dx 07/11/18   Closed fracture of right tibial plateau 01/31/2018   Complex tear of medial meniscus of right knee 03/27/2018   COPD (chronic obstructive pulmonary disease) ([facility])    no inhaler   Diabetes mellitus without complication (HCC)    DJD (degenerative joint disease)    Fibromyalgia    Genetic testing 03/08/2017   Ms. Stage underwent genetic counseling and testing for hereditary cancer syndromes on 02/21/2017. Her results were negative for mutations in all 83 genes analyzed by [facility]'s 83-gene Multi-Cancers Panel. 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   History of abnormal mammogram    History of arthroplasty of right knee 07/06/2018   Hyperlipidemia    Hypertension    Irregular heart beat    Neuromuscular disorder (HCC)    fibromyalgia   Sleep apnea    cpap some   Stress fracture  of right tibia 03/27/2018   SVD (spontaneous vaginal delivery)    x 2   Tubular adenoma of colon 05/09/12   "innumerable number" of flat polyps   Past Surgical History:  Procedure Laterality Date   CARPAL TUNNEL RELEASE Left 10/20/14   [facility]   CATARACT EXTRACTION, BILATERAL     CESAREAN SECTION  1977   CHOLECYSTECTOMY  1990   COLONOSCOPY     IR RADIOLOGIST EVAL & MGMT  07/16/2019   KNEE ARTHROSCOPY WITH SUBCHONDROPLASTY Right 03/27/2018   Procedure: Right knee arthroscopic partial medial meniscus repair versus menisectomy with medial tibia subchondroplasty;  Surgeon: Nicholes Stairs, MD;  Location: [facility];  Service: Orthopedics;  Laterality: Right;  120 mins   LARYNX SURGERY     POLYP REMOVAL   MULTIPLE TOOTH EXTRACTIONS     POLYPECTOMY     vocal cord   TONSILLECTOMY  1965   TUBAL LIGATION     TUMOR REMOVAL     benign fatty tumors on abdomen   UPPER GASTROINTESTINAL ENDOSCOPY     Patient Active Problem List   Diagnosis Date Noted   B12 deficiency 03/31/2021   Left shoulder pain 08/12/2020   Left knee pain 08/12/2020   Encephalopathy, hepatic ([place]) 08/01/2019  Thrombocytopenia ([place]) 07/05/2019   Hyperlipidemia associated with type 2 diabetes mellitus ([place]) 04/02/2019   Liver cirrhosis secondary to NASH ([place]) 07/11/2018   Degeneration of lumbar intervertebral disc 12/28/2017   Lumbar radiculopathy 12/28/2017   Spondylolisthesis, grade 1 12/28/2017   Chronic pain of right knee 12/19/2017   Genetic testing 03/08/2017   Restrictive lung disease secondary to obesity 08/04/2015   Physical deconditioning 08/04/2015   OSA treated with BiPAP 08/04/2015   Gastroesophageal reflux disease without esophagitis 12/25/2014   Low serum vitamin D 05/28/2014   Osteopenia 05/28/2014   COPD (chronic obstructive pulmonary disease) ([place]) 01/29/2014   Dyspnea on exertion 01/07/2014   Fatigue March 04, 2013   Murmur, cardiac 2013-03-04   Diabetes ([place]) 03/04/2013   Morbid obesity ([place])  03-04-13   Family history of sudden death 03-04-2013   Hypertension associated with diabetes ([place]) 12/20/2012   GAD (generalized anxiety disorder) 12/20/2012   Fibromyalgia muscle pain 12/20/2012   SUBJECTIVE                                                                                                                                                                                            IFO:YDXAJO [name]  REFERRING PROVIDER: Adam Phenix  REFERRING DIAG: [phone] (ICD-10-CM) - Liver cirrhosis secondary to NASH ([place]) R60.0 (ICD-10-CM) - Bilateral lower extremity edema   THERAPY DIAG:  R60.0 (ICD-10-CM) - Bilateral lower extremity edema    Rationale for Evaluation and Treatment Rehabilitation  ONSET DATE: chronic   SUBJECTIVE:   SUBJECTIVE STATEMENT: Ms. Moree states that her legs have been swelling for a long time.  She has a bed that will elevate her legs but now that is not decreasing her swelling.  Her heart Dr. wanted her to try therapy.  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  She went down to the the outlet in [place] at the request of her MD but she could not find any that fit her.  She states that her Rt leg was fx several years ago, she states this is when her swelling really started.  PERTINENT HISTORY: LOWELLA KINDLEY is a 76 y.o. female with Morbid obesity, obstructive sleep apnea, essential hypertension, hyperlipidemia and chronic leg edema DONJA TIPPING is a 76 y.o. female with Morbid obesity, obstructive sleep apnea, essential hypertension, hyperlipidemia and chronic leg edema.  PT has low back pain, fibromyalgia.     PAIN:  Are you having pain? Yes: Pain location: 6/10 Pain description: not sure  Aggravating factors: not sure Relieving factors: legs elevated   PRECAUTIONS: Other: cellulitis  WEIGHT BEARING RESTRICTIONS No  FALLS:  Has patient fallen in last 6 months? No  LIVING ENVIRONMENT: Lives with:  lives with their family Lives in: House/apartment Stairs: No Has following equipment at home: Single point cane and Environmental consultant - 2 wheeled  OCCUPATION: retired   PLOF: Independent with basic ADLs  PATIENT GOALS to move better    OBJECTIVE  COGNITION:  Overall cognitive status: Within functional limits for tasks assessed   PALPATION: Noted induration B    LYMPHEDEMA ASSESSMENTS:       LE LANDMARK RIGHT eval  At groin   30 cm proximal to suprapatella   20 cm proximal to suprapatella  77  10 cm proximal to suprapatella  65.5  At midpatella / popliteal crease  54.8  30 cm proximal to floor at lateral plantar foot  54.4  20 cm proximal to floor at lateral plantar foot  41.1  10 cm proximal to floor at lateral plantar foot  29.8  Circumference of ankle/heel  36.7  5 cm proximal to 1st MTP joint  25.8  Across MTP joint  26.2  Around proximal great toe    (Blank rows = not tested)  LE LANDMARK LEFT eval  At groin   30 cm proximal to suprapatella   20 cm proximal to suprapatella  75  10 cm proximal to suprapatella  67  At midpatella / popliteal crease  57.5  30 cm proximal to floor at lateral plantar foot  52  20 cm proximal to floor at lateral plantar foot  40.7  10 cm proximal to floor at lateral plantar foot  31.4  Circumference of ankle/heel  36  5 cm proximal to 1st MTP joint  25.8  Across MTP joint 25.2  Around proximal great toe   (Blank rows = not tested)    TODAY'S TREATMENT  6/21: Explained to pt what lymphedema is, that it can not be cured but can be controlled and the four aspects of treatment.   PATIENT EDUCATION:  Education details: HEP Person educated: Patient Education method: Consulting civil engineer, Verbal cues, and Handouts Education comprehension: returned demonstration and needs further education   HOME EXERCISE PROGRAM: Ankle pumps, LAQ, hip ab/adduction, marching, diaphragm breath, lymphatic squeeze.  ASSESSMENT:  CLINICAL IMPRESSION: Patient  is a 76 y.o. female  who was seen today for physical therapy evaluation and treatment for edema in both LE most likely lymphedema.  The pt states that she has become inactive due to the effort needed to walk.  She has been referred to and will benefit from skilled PT to reduce the edema in her LE's.  Husband and wife were educated that the pt would need to obtain short stretch bandages pt was given the federal guideline for poverty as they may be below the 200% threshold and qualify for the bandage grant at [facility].  Husband is engaged and would like to begin learning how to assist with the manual to assist in improving his wife's lymphatic flow.  Ms. Culbreath will benefit from total decongestive techniques as well as education on exercise and self manual techniques to reduce her fluid to improve her mobility and decrease her risk for cellulitis.    OBJECTIVE IMPAIRMENTS Abnormal gait, decreased activity tolerance, difficulty walking, increased edema, and decreased skin integrity. .   ACTIVITY LIMITATIONS carrying, lifting, dressing, and locomotion level  PARTICIPATION LIMITATIONS: shopping and community activity  PERSONAL FACTORS Age, Fitness, and Time since onset of injury/illness/exacerbation are also affecting patient's functional outcome.   REHAB POTENTIAL: Good  CLINICAL DECISION MAKING:  Evolving/moderate complexity  EVALUATION COMPLEXITY: Moderate  GOALS: Goals reviewed with patient? No  SHORT TERM GOALS: Target date: 02/23/2022    PT to be completing HEP at least daily to assist in increasing lymphatic circulation  Baseline: Goal status: INITIAL  2.  Pt to be completing self manual techniques at home to assist in lymphatic circulation  Baseline:  Goal status: INITIAL  3.  PT to have lost 2-3 cm in thigh and LE measurements, (not foot) Baseline:  Goal status: INITIAL    LONG TERM GOALS: Target date: 03/16/22   PT to have received and be using a compression pump daily  when short stretch bandaging is not donned. Baseline:  Goal status: INITIAL  2.  PT to have lost between 3-4 cm in thigh and LE measurements to reduce risk of cellulitis. Baseline:  Goal status: INITIAL  3.  Pt to be able to state the maintenance phase of lymphedema. Baseline:  Goal status: INITIAL PLAN: PT FREQUENCY: 3x/week  PT DURATION: 6 weeks  PLANNED INTERVENTIONS: Therapeutic exercises, Patient/Family education, Manual lymph drainage, Compression bandaging, and Manual therapy  PLAN FOR NEXT SESSION: Begin manual for B LE, educate family on manual and give them self manual sheet.  Cut foam for [place], PT CLT [phone]  02/02/2022, 4:11 PM

## 2022-02-02 NOTE — Therapy (Deleted)
OUTPATIENT PHYSICAL THERAPY LOWER EXTREMITY EVALUATION   Patient Name: Stacey Summers MRN: [id number] DOB:13-Feb-1946, 76 y.o., female Today's Date: 02/02/2022    Past Medical History:  Diagnosis Date   Allergy    seasonal   Anxiety    Arthritis    hands, back    B12 deficiency 03/31/2021   Cataract    surgery to remove   Chronic back pain    lower back    Cirrhosis of liver without ascites ([place])    recent dx 07/11/18   Closed fracture of right tibial plateau 01/31/2018   Complex tear of medial meniscus of right knee 03/27/2018   COPD (chronic obstructive pulmonary disease) ([place])    no inhaler   Diabetes mellitus without complication (HCC)    DJD (degenerative joint disease)    Fibromyalgia    Genetic testing 03/08/2017   Ms. Dantes underwent genetic counseling and testing for hereditary cancer syndromes on 02/21/2017. Her results were negative for mutations in all 83 genes analyzed by [facility]'s 83-gene Multi-Cancers Panel. 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   History of abnormal mammogram    History of arthroplasty of right knee 07/06/2018   Hyperlipidemia    Hypertension    Irregular heart beat    Neuromuscular disorder (HCC)    fibromyalgia   Sleep apnea    cpap some   Stress fracture of right tibia 03/27/2018   SVD (spontaneous vaginal delivery)    x 2   Tubular adenoma of colon 05/09/12   "innumerable number" of flat polyps   Past Surgical History:  Procedure Laterality Date   CARPAL TUNNEL RELEASE Left 10/20/14   [facility]   CATARACT EXTRACTION, BILATERAL     CESAREAN SECTION  1977   CHOLECYSTECTOMY  1990   COLONOSCOPY     IR RADIOLOGIST EVAL & MGMT  07/16/2019   KNEE ARTHROSCOPY WITH SUBCHONDROPLASTY Right 03/27/2018    Procedure: Right knee arthroscopic partial medial meniscus repair versus menisectomy with medial tibia subchondroplasty;  Surgeon: Nicholes Stairs, MD;  Location: [facility];  Service: Orthopedics;  Laterality: Right;  120 mins   LARYNX SURGERY     POLYP REMOVAL   MULTIPLE TOOTH EXTRACTIONS     POLYPECTOMY     vocal cord   TONSILLECTOMY  1965   TUBAL LIGATION     TUMOR REMOVAL     benign fatty tumors on abdomen   UPPER GASTROINTESTINAL ENDOSCOPY     Patient Active Problem List   Diagnosis Date Noted   B12 deficiency 03/31/2021   Left shoulder pain 08/12/2020   Left knee pain 08/12/2020   Encephalopathy, hepatic ([place]) 08/01/2019   Thrombocytopenia ([place]) 07/05/2019   Hyperlipidemia associated with type 2 diabetes mellitus ([place]) 04/02/2019   Liver cirrhosis secondary to NASH ([place]) 07/11/2018   Degeneration of lumbar intervertebral disc 12/28/2017   Lumbar radiculopathy 12/28/2017   Spondylolisthesis, grade 1 12/28/2017   Chronic pain of right knee 12/19/2017   Genetic testing 03/08/2017   Restrictive lung disease secondary to obesity 08/04/2015   Physical deconditioning 08/04/2015   OSA treated with BiPAP 08/04/2015   Gastroesophageal reflux disease without esophagitis 12/25/2014   Low serum vitamin D 05/28/2014   Osteopenia 05/28/2014   COPD (chronic obstructive pulmonary disease) ([place]) 01/29/2014  Dyspnea on exertion 01/07/2014   Fatigue 03/09/13   Murmur, cardiac 09-Mar-2013   Diabetes ([place] [place]) 03/09/13   Morbid obesity ([place]) 2013-03-09   Family history of sudden death 03-09-13   Hypertension associated with diabetes ([place]) 12/20/2012   GAD (generalized anxiety disorder) 12/20/2012   Fibromyalgia muscle pain 12/20/2012    PCP: ***  REFERRING PROVIDER: ***  REFERRING DIAG: ***  THERAPY DIAG:  No diagnosis found.  Rationale for Evaluation and Treatment {HABREHAB:27488}  ONSET DATE: ***  SUBJECTIVE:   SUBJECTIVE STATEMENT: ***  PERTINENT  HISTORY: ***  PAIN:  Are you having pain? {OPRCPAIN:27236}  PRECAUTIONS: {Therapy precautions:24002}  WEIGHT BEARING RESTRICTIONS {Yes ***/No:24003}  FALLS:  Has patient fallen in last 6 months? {fallsyesno:27318}  LIVING ENVIRONMENT: Lives with: {OPRC lives with:25569::"lives with their family"} Lives in: {Lives in:25570} Stairs: {opstairs:27293} Has following equipment at home: {Assistive devices:23999}  OCCUPATION: ***  PLOF: {PLOF:24004}  PATIENT GOALS ***   OBJECTIVE:   DIAGNOSTIC FINDINGS: ***  PATIENT SURVEYS:  {rehab surveys:24030}  COGNITION:  Overall cognitive status: {cognition:24006}     SENSATION: {sensation:27233}  EDEMA:  {edema:24020}  MUSCLE LENGTH: Hamstrings: Right *** deg; Left *** deg Thomas test: Right *** deg; Left *** deg  POSTURE: {posture:25561}  PALPATION: ***  LOWER EXTREMITY ROM:  {AROM/PROM:27142} ROM Right eval Left eval  Hip flexion    Hip extension    Hip abduction    Hip adduction    Hip internal rotation    Hip external rotation    Knee flexion    Knee extension    Ankle dorsiflexion    Ankle plantarflexion    Ankle inversion    Ankle eversion     (Blank rows = not tested)  LOWER EXTREMITY MMT:  MMT Right eval Left eval  Hip flexion    Hip extension    Hip abduction    Hip adduction    Hip internal rotation    Hip external rotation    Knee flexion    Knee extension    Ankle dorsiflexion    Ankle plantarflexion    Ankle inversion    Ankle eversion     (Blank rows = not tested)  LOWER EXTREMITY SPECIAL TESTS:  {LEspecialtests:26242}  FUNCTIONAL TESTS:  {Functional tests:24029}  GAIT: Distance walked: *** Assistive device utilized: {Assistive devices:23999} Level of assistance: {Levels of assistance:24026} Comments: ***    TODAY'S TREATMENT: ***   PATIENT EDUCATION:  Education details: *** Person educated: {Person educated:25204} Education method: {Education  Method:25205} Education comprehension: {Education Comprehension:25206}   HOME EXERCISE PROGRAM: ***  ASSESSMENT:  CLINICAL IMPRESSION: Patient is a *** y.o. *** who was seen today for physical therapy evaluation and treatment for ***.    OBJECTIVE IMPAIRMENTS {opptimpairments:25111}.   ACTIVITY LIMITATIONS {activitylimitations:27494}  PARTICIPATION LIMITATIONS: {participationrestrictions:25113}  PERSONAL FACTORS {Personal factors:25162} are also affecting patient's functional outcome.   REHAB POTENTIAL: {rehabpotential:25112}  CLINICAL DECISION MAKING: {clinical decision making:25114}  EVALUATION COMPLEXITY: {Evaluation complexity:25115}   GOALS: Goals reviewed with patient? {yes/no:20286}  SHORT TERM GOALS: Target date: {follow up:25551}  *** Baseline: Goal status: {GOALSTATUS:25110}  2.  *** Baseline:  Goal status: {GOALSTATUS:25110}  3.  *** Baseline:  Goal status: {GOALSTATUS:25110}  4.  *** Baseline:  Goal status: {GOALSTATUS:25110}  5.  *** Baseline:  Goal status: {GOALSTATUS:25110}  6.  *** Baseline:  Goal status: {GOALSTATUS:25110}  LONG TERM GOALS: Target date: {follow up:25551}   *** Baseline:  Goal status: {GOALSTATUS:25110}  2.  *** Baseline:  Goal status: {GOALSTATUS:25110}  3.  *** Baseline:  Goal status: {GOALSTATUS:25110}  4.  *** Baseline:  Goal status: {GOALSTATUS:25110}  5.  *** Baseline:  Goal status: {GOALSTATUS:25110}  6.  *** Baseline:  Goal status: {GOALSTATUS:25110}   PLAN: PT FREQUENCY: {rehab frequency:25116}  PT DURATION: {rehab duration:25117}  PLANNED INTERVENTIONS: {rehab planned interventions:25118::"Therapeutic exercises","Therapeutic activity","Neuromuscular re-education","Balance training","Gait training","Patient/Family education","Joint mobilization"}  PLAN FOR NEXT SESSION: ***   Itzabella Sorrels,CINDY, PT 02/02/2022, 9:00 AM

## 2022-02-04 ENCOUNTER — Encounter (HOSPITAL_COMMUNITY): Payer: Self-pay

## 2022-02-04 ENCOUNTER — Ambulatory Visit (HOSPITAL_COMMUNITY): Payer: PPO | Attending: Family Medicine

## 2022-02-04 DIAGNOSIS — K7460 Unspecified cirrhosis of liver: Secondary | ICD-10-CM | POA: Insufficient documentation

## 2022-02-04 DIAGNOSIS — R600 Localized edema: Secondary | ICD-10-CM | POA: Diagnosis not present

## 2022-02-04 DIAGNOSIS — K7581 Nonalcoholic steatohepatitis (NASH): Secondary | ICD-10-CM | POA: Diagnosis not present

## 2022-02-04 DIAGNOSIS — I890 Lymphedema, not elsewhere classified: Secondary | ICD-10-CM

## 2022-02-07 ENCOUNTER — Ambulatory Visit (HOSPITAL_COMMUNITY): Payer: PPO | Admitting: Physical Therapy

## 2022-02-07 DIAGNOSIS — I890 Lymphedema, not elsewhere classified: Secondary | ICD-10-CM | POA: Diagnosis not present

## 2022-02-07 DIAGNOSIS — R29898 Other symptoms and signs involving the musculoskeletal system: Secondary | ICD-10-CM

## 2022-02-09 ENCOUNTER — Inpatient Hospital Stay (HOSPITAL_COMMUNITY): Payer: PPO

## 2022-02-09 ENCOUNTER — Ambulatory Visit (HOSPITAL_COMMUNITY): Payer: PPO | Admitting: Physical Therapy

## 2022-02-09 VITALS — BP 124/47 | HR 52 | Temp 97.2°F | Resp 20

## 2022-02-09 DIAGNOSIS — I890 Lymphedema, not elsewhere classified: Secondary | ICD-10-CM | POA: Diagnosis not present

## 2022-02-09 DIAGNOSIS — R29898 Other symptoms and signs involving the musculoskeletal system: Secondary | ICD-10-CM

## 2022-02-09 DIAGNOSIS — D696 Thrombocytopenia, unspecified: Secondary | ICD-10-CM | POA: Insufficient documentation

## 2022-02-09 DIAGNOSIS — E538 Deficiency of other specified B group vitamins: Secondary | ICD-10-CM | POA: Insufficient documentation

## 2022-02-09 DIAGNOSIS — E611 Iron deficiency: Secondary | ICD-10-CM

## 2022-02-09 LAB — CBC WITH DIFFERENTIAL/PLATELET
Abs Immature Granulocytes: 0.02 10*3/uL (ref 0.00–0.07)
Basophils Absolute: 0 10*3/uL (ref 0.0–0.1)
Basophils Relative: 0 %
Eosinophils Absolute: 0.1 10*3/uL (ref 0.0–0.5)
Eosinophils Relative: 1 %
HCT: 44.7 % (ref 36.0–46.0)
Hemoglobin: 14.5 g/dL (ref 12.0–15.0)
Immature Granulocytes: 0 %
Lymphocytes Relative: 25 %
Lymphs Abs: 1.7 10*3/uL (ref 0.7–4.0)
MCH: 32.9 pg (ref 26.0–34.0)
MCHC: 32.4 g/dL (ref 30.0–36.0)
MCV: 101.4 fL — ABNORMAL HIGH (ref 80.0–100.0)
Monocytes Absolute: 0.8 10*3/uL (ref 0.1–1.0)
Monocytes Relative: 11 %
Neutro Abs: 4.3 10*3/uL (ref 1.7–7.7)
Neutrophils Relative %: 63 %
Platelets: 70 10*3/uL — ABNORMAL LOW (ref 150–400)
RBC: 4.41 MIL/uL (ref 3.87–5.11)
RDW: 14.5 % (ref 11.5–15.5)
WBC: 6.9 10*3/uL (ref 4.0–10.5)
nRBC: 0 % (ref 0.0–0.2)

## 2022-02-09 LAB — FERRITIN: Ferritin: 83 ng/mL (ref 11–307)

## 2022-02-09 LAB — COMPREHENSIVE METABOLIC PANEL
ALT: 47 U/L — ABNORMAL HIGH (ref 0–44)
AST: 46 U/L — ABNORMAL HIGH (ref 15–41)
Albumin: 2.8 g/dL — ABNORMAL LOW (ref 3.5–5.0)
Alkaline Phosphatase: 107 U/L (ref 38–126)
Anion gap: 11 (ref 5–15)
BUN: 60 mg/dL — ABNORMAL HIGH (ref 8–23)
CO2: 27 mmol/L (ref 22–32)
Calcium: 9.1 mg/dL (ref 8.9–10.3)
Chloride: 102 mmol/L (ref 98–111)
Creatinine, Ser: 2.1 mg/dL — ABNORMAL HIGH (ref 0.44–1.00)
GFR, Estimated: 24 mL/min — ABNORMAL LOW (ref >60)
Glucose, Bld: 211 mg/dL — ABNORMAL HIGH (ref 70–99)
Potassium: 5.1 mmol/L (ref 3.5–5.1)
Sodium: 140 mmol/L (ref 135–145)
Total Bilirubin: 2.8 mg/dL — ABNORMAL HIGH (ref 0.3–1.2)
Total Protein: 5.7 g/dL — ABNORMAL LOW (ref 6.5–8.1)

## 2022-02-09 LAB — IRON AND TIBC
Iron: 153 ug/dL (ref 28–170)
Saturation Ratios: 49 % — ABNORMAL HIGH (ref 10.4–31.8)
TIBC: 315 ug/dL (ref 250–450)
UIBC: 162 ug/dL

## 2022-02-09 LAB — VITAMIN B12: Vitamin B-12: 885 pg/mL (ref 180–914)

## 2022-02-09 MED ORDER — CYANOCOBALAMIN 1000 MCG/ML IJ SOLN
1000.0000 ug | Freq: Once | INTRAMUSCULAR | Status: AC
  Administered 2022-02-09: 1000 ug via INTRAMUSCULAR
  Filled 2022-02-09: qty 1

## 2022-02-09 NOTE — Therapy (Signed)
OUTPATIENT PHYSICAL THERAPY  Lymphedema Treatment  Patient Name: MARLINDA MIRANDA MRN: [id number] DOB:1945-09-13, 76 y.o., female Today's Date: 02/09/2022   PT End of Session - 02/09/22 1215     Visit Number 4    Number of Visits 18    Date for PT Re-Evaluation 03/16/22    Authorization Type [employer].    Progress Note Due on Visit 10    PT Start Time 1045    PT Stop Time 1210    PT Time Calculation (min) 85 min    Activity Tolerance Patient tolerated treatment well    Behavior During Therapy WFL for tasks assessed/performed              Past Medical History:  Diagnosis Date   Allergy    seasonal   Anxiety    Arthritis    hands, back    B12 deficiency 03/31/2021   Cataract    surgery to remove   Chronic back pain    lower back    Cirrhosis of liver without ascites ([facility])    recent dx 07/11/18   Closed fracture of right tibial plateau 01/31/2018   Complex tear of medial meniscus of right knee 03/27/2018   COPD (chronic obstructive pulmonary disease) (HCC)    no inhaler   Diabetes mellitus without complication (HCC)    DJD (degenerative joint disease)    Fibromyalgia    Genetic testing 03/08/2017   Ms. Chisenhall underwent genetic counseling and testing for hereditary cancer syndromes on 02/21/2017. Her results were negative for mutations in all 83 genes analyzed by [facility]'s 83-gene Multi-Cancers Panel. 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   History of abnormal mammogram    History of arthroplasty of right knee 07/06/2018   Hyperlipidemia    Hypertension    Irregular heart beat    Neuromuscular disorder (HCC)    fibromyalgia   Sleep apnea    cpap some   Stress fracture of right tibia 03/27/2018   SVD (spontaneous  vaginal delivery)    x 2   Tubular adenoma of colon 05/09/12   "innumerable number" of flat polyps   Past Surgical History:  Procedure Laterality Date   CARPAL TUNNEL RELEASE Left 10/20/14   [facility]   CATARACT EXTRACTION, BILATERAL     CESAREAN SECTION  1977   CHOLECYSTECTOMY  1990   COLONOSCOPY     IR RADIOLOGIST EVAL & MGMT  07/16/2019   KNEE ARTHROSCOPY WITH SUBCHONDROPLASTY Right 03/27/2018   Procedure: Right knee arthroscopic partial medial meniscus repair versus menisectomy with medial tibia subchondroplasty;  Surgeon: Nicholes Stairs, MD;  Location: [facility];  Service: Orthopedics;  Laterality: Right;  120 mins   LARYNX SURGERY     POLYP REMOVAL   MULTIPLE TOOTH EXTRACTIONS     POLYPECTOMY     vocal cord   TONSILLECTOMY  1965   TUBAL LIGATION     TUMOR REMOVAL     benign fatty tumors on abdomen   UPPER GASTROINTESTINAL ENDOSCOPY     Patient Active Problem List   Diagnosis Date Noted   B12 deficiency 03/31/2021   Left shoulder pain 08/12/2020   Left knee pain 08/12/2020   Encephalopathy, hepatic ([place]) 08/01/2019   Thrombocytopenia ([place]) 07/05/2019   Hyperlipidemia associated  with type 2 diabetes mellitus ([place]) 04/02/2019   Liver cirrhosis secondary to NASH ([place]) 07/11/2018   Degeneration of lumbar intervertebral disc 12/28/2017   Lumbar radiculopathy 12/28/2017   Spondylolisthesis, grade 1 12/28/2017   Chronic pain of right knee 12/19/2017   Genetic testing 03/08/2017   Restrictive lung disease secondary to obesity 08/04/2015   Physical deconditioning 08/04/2015   OSA treated with BiPAP 08/04/2015   Gastroesophageal reflux disease without esophagitis 12/25/2014   Low serum vitamin D 05/28/2014   Osteopenia 05/28/2014   COPD (chronic obstructive pulmonary disease) ([place]) 01/29/2014   Dyspnea on exertion 01/07/2014   Fatigue March 21, 2013   Murmur, cardiac 2013-03-21   Diabetes ([place]) 21-Mar-2013   Morbid obesity ([place]) 03-21-13   Family history of sudden death  2013-03-21   Hypertension associated with diabetes ([place]) 12/20/2012   GAD (generalized anxiety disorder) 12/20/2012   Fibromyalgia muscle pain 12/20/2012   SUBJECTIVE                                                                                                                                                                                            LDJ:[id number] [name]  REFERRING PROVIDER: Adam Phenix  REFERRING DIAG: [phone] (ICD-10-CM) - Liver cirrhosis secondary to NASH ([place]) R60.0 (ICD-10-CM) - Bilateral lower extremity edema   THERAPY DIAG:  R60.0 (ICD-10-CM) - Bilateral lower extremity edema    Rationale for Evaluation and Treatment Rehabilitation  ONSET DATE: chronic   SUBJECTIVE:   SUBJECTIVE STATEMENT:  Pt states that she should have her bandages tomorrow.  States she has not been contacted by the pump company.  She is completing her exercises 2-3x a day and can tell that they are making her legs feel better.  PERTINENT HISTORY: Morbid obesity, obstructive sleep apnea, essential hypertension, hyperlipidemia and chronic leg edema.  PT has low back pain, fibromyalgia.     PAIN:  Are you having pain? Yes back pt has chronic back pain we are not treating or addressing this. Pt states she needs surgery but MD will not complete due to her medical history.   PRECAUTIONS: Other: cellulitis  WEIGHT BEARING RESTRICTIONS No  FALLS:  Has patient fallen in last 6 months? No  LIVING ENVIRONMENT: Lives with: lives with their family Lives in: House/apartment Stairs: No Has following equipment at home: Single point cane and Environmental consultant - 2 wheeled  OCCUPATION: retired   PLOF: Independent with basic ADLs  PATIENT GOALS to move better    OBJECTIVE  COGNITION:  Overall cognitive status: Within functional limits for tasks assessed   PALPATION: Noted induration B    LYMPHEDEMA ASSESSMENTS:  LE LANDMARK RIGHT Eval 6/21 Rt  6/28  At groin    30  cm proximal to suprapatella    20 cm proximal to suprapatella  77 79  10 cm proximal to suprapatella  65.5 68.3  At midpatella / popliteal crease  54.8 53  30 cm proximal to floor at lateral plantar foot  54.4 51  20 cm proximal to floor at lateral plantar foot  41.1 40.3  10 cm proximal to floor at lateral plantar foot  29.8 29.3  Circumference of ankle/heel  36.7 35.4  5 cm proximal to 1st MTP joint  25.8 25  Across MTP joint  26.2 25.6  Around proximal great toe     (Blank rows = not tested)  LE LANDMARK LEFT Eval 6/21 Lt  6/28  At groin    30 cm proximal to suprapatella    20 cm proximal to suprapatella  75 73  10 cm proximal to suprapatella  67 66  At midpatella / popliteal crease  57.5 57  30 cm proximal to floor at lateral plantar foot  52 55  20 cm proximal to floor at lateral plantar foot  40.7 45.5  10 cm proximal to floor at lateral plantar foot  31.4 30.8  Circumference of ankle/heel  36 35.8  5 cm proximal to 1st MTP joint  25.8 26  Across MTP joint 25.2 25.4  Around proximal great toe    (Blank rows = not tested)    TODAY'S TREATMENT               02/09/22:              Manual decongestive massage for bil LE's including short neck, deep and superficial abdominal, spinal nodes, inguinal and axillary node activation.  Bilateral inguinal-axillary     anastomosis supine anterior, sidelying for posterior for bilateral LE's. Short stretch bandaging using one six and two 8 cm short stretch bandages as well as 1/2 inch foam.  These were taken from departmental stock and we will replace with pt's bandages and continue with full bandaging next treatment when pt brings in her bandages.   02/07/22: Manual decongestive massage for bil LE's including short neck, deep and superficial abdominal, spinal nodes, inguinal and axillary node activation.  Bilateral inguinal-axillary anastomosis supine anterior, sidelying for posterior for bilateral LE's. Education: as above to both  patient and CG regarding lymphedema.  Noted area on Lt anterior LE that pt states has been there a while that needs to be looked at by dermatologist.  02/04/22: Reviewed goals, educated importance of HEP compliance for maximal benefits, educated 4 aspects of lymph therapy Therex: LAQ, ankle pumps, adduction, abduction, marching, squeeze 10x each Manual lymphedema decongestive with LE elevated on wedge in supine included: short neck, superficial and deep abdominal, inguinal to axillary anastomoses, sidelying for posterior. 6/21: Explained to pt what lymphedema is, that it can not be cured but can be controlled and the four aspects of treatment.   PATIENT EDUCATION:  Education details: Reviewed goals, educated 4 components of lymph care: skin, exercise, manual and compression, reviewed HEP Eval: HEP Person educated: Patient Education method: Explanation, Verbal cues, and Handouts Education comprehension: returned demonstration and needs further education   HOME EXERCISE PROGRAM: Ankle pumps, LAQ, hip ab/adduction, marching, diaphragm breath, lymphatic squeeze.  ASSESSMENT:  CLINICAL IMPRESSION: Pt measured with ; some measurements were up while others were down.  Therapist  noted induration in posterior aspect of B LE as well as posterior  distal aspect of B thighs.    OBJECTIVE IMPAIRMENTS Abnormal gait, decreased activity tolerance, difficulty walking, increased edema, and decreased skin integrity. .   ACTIVITY LIMITATIONS carrying, lifting, dressing, and locomotion level  PARTICIPATION LIMITATIONS: shopping and community activity  PERSONAL FACTORS Age, Fitness, and Time since onset of injury/illness/exacerbation are also affecting patient's functional outcome.   REHAB POTENTIAL: Good  CLINICAL DECISION MAKING: Evolving/moderate complexity  EVALUATION COMPLEXITY: Moderate  GOALS: Goals reviewed with patient? No  SHORT TERM GOALS: Target date: 03/02/2022    PT to be completing  HEP at least daily to assist in increasing lymphatic circulation  Baseline: Goal status: MET  2.  Pt to be completing self manual techniques at home to assist in lymphatic circulation  Baseline:  Goal status: IN PROGRESS  3.  PT to have lost 2-3 cm in thigh and LE measurements, (not foot) Baseline:  Goal status: IN PROGRESS    LONG TERM GOALS: Target date: 03/16/22   PT to have received and be using a compression pump daily when short stretch bandaging is not donned. Baseline:  Goal status: IN PROGRESS  2.  PT to have lost between 3-4 cm in thigh and LE measurements to reduce risk of cellulitis. Baseline:  Goal status: IN PROGRESS  3.  Pt to be able to state the maintenance phase of lymphedema. Baseline:  Goal status: IN PROGRESS PLAN: PT FREQUENCY: 3x/week  PT DURATION: 6 weeks  PLANNED INTERVENTIONS: Therapeutic exercises, Patient/Family education, Manual lymph drainage, Compression bandaging, and Manual therapy  PLAN FOR NEXT SESSION:  Review self manual and give sheet to assure correct mechanics.  Begin full compression.  Measure on Wed.  02/09/2022, 12:15 PM Rayetta Humphrey, [place] CLT [phone]

## 2022-02-09 NOTE — Patient Instructions (Signed)
[facility]  Discharge Instructions: Thank you for choosing [facility] to provide your oncology and hematology care.  If you have a lab appointment with the [facility], please come in thru the Main Entrance and check in at the main information desk.  Wear comfortable clothing and clothing appropriate for easy access to any Portacath or PICC line.   We strive to give you quality time with your provider. You may need to reschedule your appointment if you arrive late (15 or more minutes).  Arriving late affects you and other patients whose appointments are after yours.  Also, if you miss three or more appointments without notifying the office, you may be dismissed from the clinic at the provider's discretion.      For prescription refill requests, have your pharmacy contact our office and allow 72 hours for refills to be completed.    Today you received the following chemotherapy and/or immunotherapy agents B12      To help prevent nausea and vomiting after your treatment, we encourage you to take your nausea medication as directed.  BELOW ARE SYMPTOMS THAT SHOULD BE REPORTED IMMEDIATELY: *FEVER GREATER THAN 100.4 F (38 C) OR HIGHER *CHILLS OR SWEATING *NAUSEA AND VOMITING THAT IS NOT CONTROLLED WITH YOUR NAUSEA MEDICATION *UNUSUAL SHORTNESS OF BREATH *UNUSUAL BRUISING OR BLEEDING *URINARY PROBLEMS (pain or burning when urinating, or frequent urination) *BOWEL PROBLEMS (unusual diarrhea, constipation, pain near the anus) TENDERNESS IN MOUTH AND THROAT WITH OR WITHOUT PRESENCE OF ULCERS (sore throat, sores in mouth, or a toothache) UNUSUAL RASH, SWELLING OR PAIN  UNUSUAL VAGINAL DISCHARGE OR ITCHING   Items with * indicate a potential emergency and should be followed up as soon as possible or go to the Emergency Department if any problems should occur.  Please show the CHEMOTHERAPY ALERT CARD or IMMUNOTHERAPY ALERT CARD at check-in to the Emergency Department  and triage nurse.  Should you have questions after your visit or need to cancel or reschedule your appointment, please contact [facility] [phone]  and follow the prompts.  Office hours are 8:00 a.m. to 4:30 p.m. Monday - Friday. Please note that voicemails left after 4:00 p.m. may not be returned until the following business day.  We are closed weekends and major holidays. You have access to a nurse at all times for urgent questions. Please call the main number to the clinic [phone] and follow the prompts.  For any non-urgent questions, you may also contact your provider using MyChart. We now offer e-Visits for anyone 74 and older to request care online for non-urgent symptoms. For details visit [email or web address].   Also download the MyChart app! Go to the app store, search "MyChart", open the app, select [facility], and log in with your MyChart username and password.  Masks are optional in the cancer centers. If you would like for your care team to wear a mask while they are taking care of you, please let them know. For doctor visits, patients may have with them one support person who is at least 74 years old. At this time, visitors are not allowed in the infusion area.

## 2022-02-09 NOTE — Progress Notes (Signed)
Patient called and made aware of results. Patient stated she was going to call her primary and make an appointment to see them.

## 2022-02-10 ENCOUNTER — Ambulatory Visit (INDEPENDENT_AMBULATORY_CARE_PROVIDER_SITE_OTHER): Payer: PPO

## 2022-02-10 ENCOUNTER — Ambulatory Visit (INDEPENDENT_AMBULATORY_CARE_PROVIDER_SITE_OTHER): Payer: PPO | Admitting: Family Medicine

## 2022-02-10 ENCOUNTER — Encounter: Payer: Self-pay | Admitting: Family Medicine

## 2022-02-10 VITALS — Wt 231.0 lb

## 2022-02-10 VITALS — BP 122/62 | HR 56 | Temp 98.5°F | Ht 62.0 in | Wt 231.0 lb

## 2022-02-10 DIAGNOSIS — Z0000 Encounter for general adult medical examination without abnormal findings: Secondary | ICD-10-CM | POA: Diagnosis not present

## 2022-02-10 DIAGNOSIS — N179 Acute kidney failure, unspecified: Secondary | ICD-10-CM | POA: Diagnosis not present

## 2022-02-10 DIAGNOSIS — N1832 Chronic kidney disease, stage 3b: Secondary | ICD-10-CM

## 2022-02-10 NOTE — Progress Notes (Signed)
Subjective:   Stacey Summers is a 76 y.o. female who presents for Medicare Annual (Subsequent) preventive examination.  Virtual Visit via Telephone Note  I connected with  Stacey Summers on 02/10/22 at  1:15 PM EDT by telephone and verified that I am speaking with the correct person using two identifiers.  Location: Patient: Home Provider: [facility] Persons participating in the virtual visit: patient/Nurse Health Advisor   I discussed the limitations, risks, security and privacy concerns of performing an evaluation and management service by telephone and the availability of in person appointments. The patient expressed understanding and agreed to proceed.  Interactive audio and video telecommunications were attempted between this nurse and patient, however failed, due to patient having technical difficulties OR patient did not have access to video capability.  We continued and completed visit with audio only.  Some vital signs may be absent or patient reported.   Stacey Golden E Ylianna Almanzar, LPN   Review of Systems     Cardiac Risk Factors include: advanced age (>57mn, >>57women);diabetes mellitus;dyslipidemia;hypertension;sedentary lifestyle;obesity (BMI >30kg/m2);Other (see comment), Risk factor comments: OSA on BIPAP, COPD, NASH, cirrhosis, restrictive lung disease     Objective:    Today's Vitals   02/10/22 1305  Weight: 231 lb (104.8 kg)  PainSc: 0-No pain   Body mass index is 42.25 kg/m.     02/10/2022    1:11 PM 02/09/2022    1:05 PM 02/02/2022    1:32 PM 01/12/2022    2:35 PM 12/15/2021    3:36 PM 11/17/2021    1:37 PM 10/20/2021    2:01 PM  Advanced Directives  Does Patient Have a Medical Advance Directive? No No No No No No No  Would patient like information on creating a medical advance directive? No - Patient declined No - Patient declined No - Patient declined No - Patient declined No - Patient declined No - Patient declined No - Patient declined    Current Medications  (verified) Outpatient Encounter Medications as of 02/10/2022  Medication Sig   albuterol (VENTOLIN HFA) 108 (90 Base) MCG/ACT inhaler Inhale 2 puffs into the lungs every 6 (six) hours as needed for wheezing or shortness of breath.   ALPRAZolam (XANAX) 0.25 MG tablet    atorvastatin (LIPITOR) 20 MG tablet Take 1 tablet (20 mg total) by mouth daily.   benazepril (LOTENSIN) 20 MG tablet Take 1 tablet (20 mg total) by mouth daily. (NEEDS TO BE SEEN BEFORE NEXT REFILL)   Biotin 5000 MCG SUBL Place under the tongue.   blood glucose meter kit and supplies KIT Dispense based on patient and insurance preference. Use up to four times daily as directed. (FOR ICD-9 250.00, 250.01).   Blood Glucose Monitoring Suppl (ONETOUCH VERIO FLEX SYSTEM) w/Device KIT Check Bs up to 4 times daily Dx E11.9,   Cholecalciferol (VITAMIN D3) 5000 units CAPS Take 5,000 Units by mouth daily.   cyclobenzaprine (FLEXERIL) 5 MG tablet TAKE 1 TABLET BY MOUTH THREE TIMES DAILY AS NEEDED FOR MUSCLE SPASM (NEEDS  APPOINTMENT)   diltiazem (CARDIZEM CD) 120 MG 24 hr capsule Take 1 capsule by mouth once daily   ferrous sulfate 325 (65 FE) MG EC tablet Take 1 tablet (325 mg total) by mouth daily.   fluticasone (FLONASE) 50 MCG/ACT nasal spray USE 1 SPRAY(S) IN EACH NOSTRIL IN THE MORNING AND AT BEDTIME   furosemide (LASIX) 40 MG tablet Take 2 tablet ( total 80 mg)in the morning  and 40 mg in the afternoon  glucose blood (ONETOUCH VERIO) test strip CHECK GLUCOSE UP TO 4 TIMES DAILY Dx E11.69   lactulose (CHRONULAC) 10 GM/15ML solution TAKE 15 ML BY MOUTH  THREE TIMES DAILY AS NEEDED   LORazepam (ATIVAN) 0.5 MG tablet Take 0.5-1 tablets (0.25-0.5 mg total) by mouth 2 (two) times daily as needed for anxiety.   meclizine (ANTIVERT) 25 MG tablet Take 1 tablet (25 mg total) by mouth 3 (three) times daily as needed for dizziness.   metFORMIN (GLUCOPHAGE) 500 MG tablet Take 1 tablet (500 mg total) by mouth daily with breakfast.   omeprazole  (PRILOSEC) 40 MG capsule    ondansetron (ZOFRAN-ODT) 4 MG disintegrating tablet Take 1 tablet (4 mg total) by mouth every 6 (six) hours as needed for nausea or vomiting.   ONETOUCH DELICA LANCETS 11M MISC USE ONE LANCET TO CHECK GLUCOSE ONCE DAILY AND AS NEEDED   spironolactone (ALDACTONE) 25 MG tablet Take 1 tablet (25 mg total) by mouth daily.   traMADol (ULTRAM) 50 MG tablet Take 1 tablet (50 mg total) by mouth every 12 (twelve) hours as needed for moderate pain.   valACYclovir (VALTREX) 1000 MG tablet valacyclovir 1 gram tablet   No facility-administered encounter medications on file as of 02/10/2022.    Allergies (verified) Latex   History: Past Medical History:  Diagnosis Date   Allergy    seasonal   Anxiety    Arthritis    hands, back    B12 deficiency 03/31/2021   Cataract    surgery to remove   Chronic back pain    lower back    Cirrhosis of liver without ascites ([place])    recent dx 07/11/18   Closed fracture of right tibial plateau 01/31/2018   Complex tear of medial meniscus of right knee 03/27/2018   COPD (chronic obstructive pulmonary disease) ([place])    no inhaler   Diabetes mellitus without complication (HCC)    DJD (degenerative joint disease)    Fibromyalgia    Genetic testing 03/08/2017   Stacey Summers underwent genetic counseling and testing for hereditary cancer syndromes on 02/21/2017. Her results were negative for mutations in all 83 genes analyzed by [facility]'s 83-gene Multi-Cancers Panel. 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   History of abnormal mammogram    History of arthroplasty of right knee 07/06/2018   Hyperlipidemia    Hypertension    Irregular heart beat    Neuromuscular disorder (HCC)    fibromyalgia    Sleep apnea    cpap some   Stress fracture of right tibia 03/27/2018   SVD (spontaneous vaginal delivery)    x 2   Tubular adenoma of colon 05/09/12   "innumerable number" of flat polyps   Past Surgical History:  Procedure Laterality Date   CARPAL TUNNEL RELEASE Left 10/20/14   [facility]   CATARACT EXTRACTION, BILATERAL     CESAREAN SECTION  1977   CHOLECYSTECTOMY  1990   COLONOSCOPY     IR RADIOLOGIST EVAL & MGMT  07/16/2019   KNEE ARTHROSCOPY WITH SUBCHONDROPLASTY Right 03/27/2018   Procedure: Right knee arthroscopic partial medial meniscus repair versus menisectomy with medial tibia subchondroplasty;  Surgeon: Nicholes Stairs, MD;  Location: [facility];  Service: Orthopedics;  Laterality: Right;  120 mins  LARYNX SURGERY     POLYP REMOVAL   MULTIPLE TOOTH EXTRACTIONS     POLYPECTOMY     vocal cord   TONSILLECTOMY  1965   TUBAL LIGATION     TUMOR REMOVAL     benign fatty tumors on abdomen   UPPER GASTROINTESTINAL ENDOSCOPY     Family History  Problem Relation Age of Onset   Heart failure Mother        d.37   Heart disease Mother    Prostate cancer Father        d.89 diagnosed in late-60s   Colon cancer Father        diagnosed at 41   Leukemia Maternal Aunt    Heart failure Maternal Uncle        d.50   Stomach cancer Paternal Aunt        questionable   Colon cancer Paternal Uncle        in his 50s   Heart failure Maternal Grandmother        d.45   Kidney cancer Cousin        d.78s maternal first-cousin   Colon polyps Brother        polyposis. Underwent partial colectomy at age 48.   Spina bifida Brother        died at birth   Colon polyps Son 34   Heart attack Son 30   Other Other 67       multiple lipomas   Esophageal cancer Neg Hx    Rectal cancer Neg Hx    Breast cancer Neg Hx    Social History   Socioeconomic History   Marital status: Married    Spouse name: [name] "goes by [employer]"   Number of children: 3   Years of education: 11   Highest  education level: GED or equivalent  Occupational History   Occupation: retired  Tobacco Use   Smoking status: Former    Packs/day: 1.00    Years: 3.00    Total pack years: 3.00    Types: Cigarettes    Quit date: 08/15/1969    Years since quitting: 52.5   Smokeless tobacco: Never  Vaping Use   Vaping Use: Never used  Substance and Sexual Activity   Alcohol use: No    Alcohol/week: 0.0 standard drinks of alcohol   Drug use: No   Sexual activity: Not Currently  Other Topics Concern   Not on file  Social History Narrative   Daughter lives with them and helps a lot   Social Determinants of Health   Financial Resource Strain: Low Risk  (02/10/2022)   Overall Financial Resource Strain (CARDIA)    Difficulty of Paying Living Expenses: Not hard at all  Food Insecurity: No Food Insecurity (02/10/2022)   Hunger Vital Sign    Worried About Running Out of Food in the Last Year: Never true    [place] in the Last Year: Never true  Transportation Needs: No Transportation Needs (02/10/2022)   PRAPARE - Hydrologist (Medical): No    Lack of Transportation (Non-Medical): No  Physical Activity: Inactive (02/10/2022)   Exercise Vital Sign    Days of Exercise per Week: 0 days    Minutes of Exercise per Session: 0 min  Stress: No Stress Concern Present (02/10/2022)   [place]    Feeling of Stress : Only a little  Social Connections: Socially Integrated (02/10/2022)  Social Licensed conveyancer [NHANES]    Frequency of Communication with Friends and Family: More than three times a week    Frequency of Social Gatherings with Friends and Family: More than three times a week    Attends Religious Services: More than 4 times per year    Active Member of [employer] or Organizations: Yes    Attends Music therapist: More than 4 times per year    Marital Status: Married    Tobacco  Counseling Counseling given: Not Answered   Clinical Intake:  Pre-visit preparation completed: Yes  Pain : No/denies pain Pain Score: 0-No pain     BMI - recorded: 42.25 Nutritional Status: BMI > 30  Obese Nutritional Risks: None Diabetes: Yes CBG done?: No Did pt. bring in CBG monitor from home?: No  How often do you need to have someone help you when you read instructions, pamphlets, or other written materials from your doctor or pharmacy?: 1 - Never  Diabetic? Nutrition Risk Assessment:  Has the patient had any N/V/D within the last 2 months?  No  Does the patient have any non-healing wounds?  No  Has the patient had any unintentional weight loss or weight gain?  No   Diabetes:  Is the patient diabetic?  Yes  If diabetic, was a CBG obtained today?  No  Did the patient bring in their glucometer from home?  No  How often do you monitor your CBG's? Once per day fasting.   Financial Strains and Diabetes Management:  Are you having any financial strains with the device, your supplies or your medication? No .  Does the patient want to be seen by Chronic Care Management for management of their diabetes?  No  Would the patient like to be referred to a Nutritionist or for Diabetic Management?  No   Diabetic Exams:  Diabetic Eye Exam: Completed 11/02/2021.  Diabetic Foot Exam: Completed 12/01/2021. Pt has been advised about the importance in completing this exam.    Interpreter Needed?: No  Information entered by :: Tashon Capp, LPN   Activities of Daily Living    02/10/2022    1:08 PM  In your present state of health, do you have any difficulty performing the following activities:  Hearing? 1  Vision? 0  Difficulty concentrating or making decisions? 0  Walking or climbing stairs? 1  Dressing or bathing? 1  Comment mostly independent, but has a little asssitance  Doing errands, shopping? 1  Preparing Food and eating ? Y  Using the Toilet? N  In the past six  months, have you accidently leaked urine? N  Do you have problems with loss of bowel control? N  Managing your Medications? N  Managing your Finances? N  Housekeeping or managing your Housekeeping? Y    Patient Care Team: Janora Norlander, DO as PCP - General (Family Medicine) Croitoru, Dani Gobble, MD as PCP - Cardiology (Cardiology) Noralee Space, MD as Consulting Physician (Pulmonary Disease) Mauri Pole, MD as Consulting Physician (Gastroenterology) Ilean China, RN as Registered Nurse Derek Jack, MD as Consulting Physician (Hematology) Celestia Khat, OD (Optometry)  Indicate any recent Medical Services you may have received from other than [facility] providers in the past year (date may be approximate).     Assessment:   This is a routine wellness examination for Khamiyah.  Hearing/Vision screen Hearing Screening - Comments:: Has hearing aid but doesn't like wearing them - got them from Audiologist in [place] -  Comments:: Wears rx glasses - up to date with routine eye exams with [facility]  Dietary issues and exercise activities discussed: Current Exercise Habits: The patient does not participate in regular exercise at present, Exercise limited by: orthopedic condition(s);respiratory conditions(s)   Goals Addressed               This Visit's Progress     DIET - INCREASE WATER INTAKE   Not on track     Try to drink 6-8 glasses of water daily      Patient Stated (pt-stated)   Not on track     Pt states " I would like to get to walking better"      Depression Screen    02/10/2022    1:12 PM 12/01/2021   10:46 AM 09/13/2021   11:18 AM 04/05/2021    4:04 PM 02/01/2021   11:53 AM 02/01/2021   11:51 AM 12/09/2020    1:38 PM  PHQ 2/9 Scores  PHQ - 2 Score 0 0 1 1 0 0 0  PHQ- 9 Score 4  6 3        Fall Risk    02/10/2022    1:07 PM 12/01/2021   10:46 AM 09/13/2021   10:55 AM 04/05/2021    4:04 PM 02/01/2021   11:52 AM  [place] in the past year? 0 0 0 1 1  Number falls in past yr: 0   0 0  Injury with Fall? 0   1 1  Risk for fall due to : Orthopedic patient   History of fall(s) Impaired balance/gait;Impaired vision;Medication side effect;Orthopedic patient;History of fall(s)  Follow up Falls prevention discussed   Education provided Education provided;Falls prevention discussed    FALL RISK PREVENTION PERTAINING TO THE HOME:  Any stairs in or around the home? Yes  If so, are there any without handrails? No  Home free of loose throw rugs in walkways, pet beds, electrical cords, etc? Yes  Adequate lighting in your home to reduce risk of falls? Yes   ASSISTIVE DEVICES UTILIZED TO PREVENT FALLS:  Life alert? No  Use of a cane, walker or w/c? Yes  Grab bars in the bathroom? Yes  Shower chair or bench in shower? Yes  Elevated toilet seat or a handicapped toilet? Yes   TIMED UP AND GO:  Was the test performed? No . Telephonic visit  Cognitive Function:    06/26/2017   10:38 AM  MMSE - Mini Mental State Exam  Orientation to time 5  Orientation to Place 5  Registration 3  Attention/ Calculation 5  Recall 2  Language- name 2 objects 2  Language- repeat 1  Language- follow 3 step command 3  Language- read & follow direction 1  Write a sentence 1  Copy design 1  Total score 29        02/10/2022    1:13 PM 02/01/2021   11:51 AM 01/31/2020    2:22 PM 01/21/2019    3:02 PM  6CIT Screen  What Year? 0 points 0 points 0 points 0 points  What month? 0 points 0 points 0 points 0 points  What time? 0 points 0 points 0 points 0 points  Count back from 20 0 points 0 points 2 points 0 points  Months in reverse 0 points 0 points 0 points 0 points  Repeat phrase 0 points 4 points 2 points 0 points  Total Score 0 points 4 points 4 points 0  points    Immunizations Immunization History  Administered Date(s) Administered   Hepb-cpg 07/19/2018, 08/20/2018   Influenza, High Dose Seasonal PF 08/03/2018    Influenza,inj,Quad PF,6+ Mos 05/28/2014, 06/16/2015, 06/10/2016, 08/05/2020   Influenza,inj,quad, With Preservative 05/15/2017, 08/03/2018   Influenza-Unspecified 04/24/2017   Pneumococcal Conjugate-13 07/21/2014   Pneumococcal Polysaccharide-23 09/21/2015   Tdap 07/07/2009   Zoster Recombinat (Shingrix) 04/29/2017, 07/12/2017    TDAP status: Due, Education has been provided regarding the importance of this vaccine. Advised may receive this vaccine at local pharmacy or Health Dept. Aware to provide a copy of the vaccination record if obtained from local pharmacy or Health Dept. Verbalized acceptance and understanding.  Flu Vaccine status: Declined, Education has been provided regarding the importance of this vaccine but patient still declined. Advised may receive this vaccine at local pharmacy or Health Dept. Aware to provide a copy of the vaccination record if obtained from local pharmacy or Health Dept. Verbalized acceptance and understanding.  Pneumococcal vaccine status: Up to date  Covid-19 vaccine status: Declined, Education has been provided regarding the importance of this vaccine but patient still declined. Advised may receive this vaccine at local pharmacy or Health Dept.or vaccine clinic. Aware to provide a copy of the vaccination record if obtained from local pharmacy or Health Dept. Verbalized acceptance and understanding.  Qualifies for Shingles Vaccine? Yes   Zostavax completed Yes   Shingrix Completed?: Yes  Screening Tests Health Maintenance  Topic Date Due   DEXA SCAN  04/05/2022 (Originally 06/27/2019)   TETANUS/TDAP  04/05/2022 (Originally 07/08/2019)   COLONOSCOPY (Pts 45-20yr Insurance coverage will need to be confirmed)  12/02/2022 (Originally 08/07/2021)   INFLUENZA VACCINE  03/15/2022   HEMOGLOBIN A1C  06/02/2022   OPHTHALMOLOGY EXAM  11/03/2022   FOOT EXAM  12/02/2022   MAMMOGRAM  12/16/2022   Pneumonia Vaccine 76 Years old  Completed   Hepatitis C  Screening  Completed   Zoster Vaccines- Shingrix  Completed   HPV VACCINES  Aged Out   COVID-19 Vaccine  Discontinued    Health Maintenance  There are no preventive care reminders to display for this patient.  Colorectal cancer screening: No longer required.   Mammogram status: Completed 12/15/2021. Repeat every year  Bone Density status: Completed 06/26/2017. Results reflect: Bone density results: OSTEOPENIA. Repeat every 2 years.  Lung Cancer Screening: (Low Dose CT Chest recommended if Age 74-80years, 30 pack-year currently smoking OR have quit w/in 15years.) does not qualify.  Additional Screening:  Hepatitis C Screening: does qualify; Completed 07/11/2018  Vision Screening: Recommended annual ophthalmology exams for early detection of glaucoma and other disorders of the eye. Is the patient up to date with their annual eye exam?  Yes  Who is the provider or what is the name of the office in which the patient attends annual eye exams? [email or web address] pt is not established with a provider, would they like to be referred to a provider to establish care? No .   Dental Screening: Recommended annual dental exams for proper oral hygiene  Community Resource Referral / Chronic Care Management: CRR required this visit?  No   CCM required this visit?  No      Plan:     I have personally reviewed and noted the following in the patient's chart:   Medical and social history Use of alcohol, tobacco or illicit drugs  Current medications and supplements including opioid prescriptions.  Functional ability and status Nutritional status Physical activity Advanced directives List of other physicians  Hospitalizations, surgeries, and ER visits in previous 12 months Vitals Screenings to include cognitive, depression, and falls Referrals and appointments  In addition, I have reviewed and discussed with patient certain preventive protocols, quality metrics, and best practice  recommendations. A written personalized care plan for preventive services as well as general preventive health recommendations were provided to patient.     Sandrea Hammond, LPN   [id number]   Nurse Notes: None

## 2022-02-10 NOTE — Progress Notes (Signed)
BP 122/62   Pulse (!) 56   Temp 98.5 F (36.9 C)   Ht _0  (1.575 m)   Wt 231 lb (104.8 kg)   SpO2 99%   BMI 42.25 kg/m    Subjective:   Patient ID: Stacey Summers, female    DOB: 1946-04-12, 76 y.o.   MRN: [id number]  HPI: Stacey Summers is a 76 y.o. female presenting on 02/10/2022 for Elevated creatinine   HPI Change in kidney function Patient was seen by her oncologist had change in kidney function where it went up significantly recently and they are sending me her today to recheck and management. Because of her fluid in her legs and cirrhosis she had spironolactone added and increased her furosemide.  Her kidney function was elevated on the last check which was yesterday.  Relevant past medical, surgical, family and social history reviewed and updated as indicated. Interim medical history since our last visit reviewed. Allergies and medications reviewed and updated.  Review of Systems  Constitutional:  Negative for chills and fever.  Eyes:  Negative for visual disturbance.  Respiratory:  Negative for chest tightness and shortness of breath.   Cardiovascular:  Positive for leg swelling. Negative for chest pain.  Musculoskeletal:  Negative for back pain and gait problem.  Skin:  Negative for rash.  Neurological:  Negative for dizziness, light-headedness and headaches.  Psychiatric/Behavioral:  Negative for agitation and behavioral problems.   All other systems reviewed and are negative.   Per HPI unless specifically indicated above   Allergies as of 02/10/2022       Reactions   Latex    "my hands feels numb"        Medication List        Accurate as of February 10, 2022  8:59 AM. If you have any questions, ask your nurse or doctor.          albuterol 108 (90 Base) MCG/ACT inhaler Commonly known as: VENTOLIN HFA Inhale 2 puffs into the lungs every 6 (six) hours as needed for wheezing or shortness of breath.   ALPRAZolam 0.25 MG tablet Commonly known  as: XANAX   atorvastatin 20 MG tablet Commonly known as: LIPITOR Take 1 tablet (20 mg total) by mouth daily.   benazepril 20 MG tablet Commonly known as: LOTENSIN Take 1 tablet (20 mg total) by mouth daily. (NEEDS TO BE SEEN BEFORE NEXT REFILL)   Biotin 5000 MCG Subl Place under the tongue.   blood glucose meter kit and supplies Kit Dispense based on patient and insurance preference. Use up to four times daily as directed. (FOR ICD-9 250.00, 250.01).   cyclobenzaprine 5 MG tablet Commonly known as: FLEXERIL TAKE 1 TABLET BY MOUTH THREE TIMES DAILY AS NEEDED FOR MUSCLE SPASM (NEEDS  APPOINTMENT)   diltiazem 120 MG 24 hr capsule Commonly known as: CARDIZEM CD Take 1 capsule by mouth once daily   ferrous sulfate 325 (65 FE) MG EC tablet Take 1 tablet (325 mg total) by mouth daily.   fluticasone 50 MCG/ACT nasal spray Commonly known as: FLONASE USE 1 SPRAY(S) IN EACH NOSTRIL IN THE MORNING AND AT BEDTIME   furosemide 40 MG tablet Commonly known as: LASIX Take 2 tablet ( total 80 mg)in the morning  and 40 mg in the afternoon   lactulose 10 GM/15ML solution Commonly known as: CHRONULAC TAKE 15 ML BY MOUTH  THREE TIMES DAILY AS NEEDED   LORazepam 0.5 MG tablet Commonly known as: ATIVAN Take 0.5-1  tablets (0.25-0.5 mg total) by mouth 2 (two) times daily as needed for anxiety.   meclizine 25 MG tablet Commonly known as: ANTIVERT Take 1 tablet (25 mg total) by mouth 3 (three) times daily as needed for dizziness.   metFORMIN 500 MG tablet Commonly known as: GLUCOPHAGE Take 1 tablet (500 mg total) by mouth daily with breakfast.   omeprazole 40 MG capsule Commonly known as: PRILOSEC   ondansetron 4 MG disintegrating tablet Commonly known as: ZOFRAN-ODT Take 1 tablet (4 mg total) by mouth every 6 (six) hours as needed for nausea or vomiting.   OneTouch Delica Lancets 53G Misc USE ONE LANCET TO CHECK GLUCOSE ONCE DAILY AND AS NEEDED   OneTouch Verio Flex System w/Device  Kit Check Bs up to 4 times daily Dx E11.9,   OneTouch Verio test strip Generic drug: glucose blood CHECK GLUCOSE UP TO 4 TIMES DAILY Dx E11.69   spironolactone 25 MG tablet Commonly known as: ALDACTONE Take 1 tablet (25 mg total) by mouth daily.   traMADol 50 MG tablet Commonly known as: ULTRAM Take 1 tablet (50 mg total) by mouth every 12 (twelve) hours as needed for moderate pain.   valACYclovir 1000 MG tablet Commonly known as: VALTREX valacyclovir 1 gram tablet   Vitamin D3 125 MCG (5000 UT) Caps Take 5,000 Units by mouth daily.         Objective:   BP 122/62   Pulse (!) 56   Temp 98.5 F (36.9 C)   Ht _0  (1.575 m)   Wt 231 lb (104.8 kg)   SpO2 99%   BMI 42.25 kg/m   Wt Readings from Last 3 Encounters:  02/10/22 231 lb (104.8 kg)  01/07/22 244 lb 9.6 oz (110.9 kg)  12/01/21 235 lb (106.6 kg)    Physical Exam Vitals and nursing note reviewed.  Constitutional:      General: She is not in acute distress.    Appearance: She is well-developed. She is not diaphoretic.  Eyes:     Conjunctiva/sclera: Conjunctivae normal.  Musculoskeletal:        General: Swelling (Lymphedema 3+) and tenderness (Tenderness in lower extremities from swelling) present. Normal range of motion.  Skin:    General: Skin is warm and dry.     Findings: No rash.  Neurological:     Mental Status: She is alert and oriented to person, place, and time.     Coordination: Coordination normal.  Psychiatric:        Behavior: Behavior normal.     Results for orders placed or performed in visit on 02/09/22  CBC with Differential/Platelet  Result Value Ref Range   WBC 6.9 4.0 - 10.5 K/uL   RBC 4.41 3.87 - 5.11 MIL/uL   Hemoglobin 14.5 12.0 - 15.0 g/dL   HCT 44.7 36.0 - 46.0 %   MCV 101.4 (H) 80.0 - 100.0 fL   MCH 32.9 26.0 - 34.0 pg   MCHC 32.4 30.0 - 36.0 g/dL   RDW 14.5 11.5 - 15.5 %   Platelets 70 (L) 150 - 400 K/uL   nRBC 0.0 0.0 - 0.2 %   Neutrophils Relative % 63 %    Neutro Abs 4.3 1.7 - 7.7 K/uL   Lymphocytes Relative 25 %   Lymphs Abs 1.7 0.7 - 4.0 K/uL   Monocytes Relative 11 %   Monocytes Absolute 0.8 0.1 - 1.0 K/uL   Eosinophils Relative 1 %   Eosinophils Absolute 0.1 0.0 - 0.5 K/uL  Basophils Relative 0 %   Basophils Absolute 0.0 0.0 - 0.1 K/uL   WBC Morphology MORPHOLOGY UNREMARKABLE    RBC Morphology MORPHOLOGY UNREMARKABLE    Immature Granulocytes 0 %   Abs Immature Granulocytes 0.02 0.00 - 0.07 K/uL  Comprehensive metabolic panel  Result Value Ref Range   Sodium 140 135 - 145 mmol/L   Potassium 5.1 3.5 - 5.1 mmol/L   Chloride 102 98 - 111 mmol/L   CO2 27 22 - 32 mmol/L   Glucose, Bld 211 (H) 70 - 99 mg/dL   BUN 60 (H) 8 - 23 mg/dL   Creatinine, Ser 2.10 (H) 0.44 - 1.00 mg/dL   Calcium 9.1 8.9 - 10.3 mg/dL   Total Protein 5.7 (L) 6.5 - 8.1 g/dL   Albumin 2.8 (L) 3.5 - 5.0 g/dL   AST 46 (H) 15 - 41 U/L   ALT 47 (H) 0 - 44 U/L   Alkaline Phosphatase 107 38 - 126 U/L   Total Bilirubin 2.8 (H) 0.3 - 1.2 mg/dL   GFR, Estimated 24 (L) >60 mL/min   Anion gap 11 5 - 15  Vitamin B12  Result Value Ref Range   Vitamin B-12 885 180 - 914 pg/mL  Ferritin  Result Value Ref Range   Ferritin 83 11 - 307 ng/mL  Iron and TIBC  Result Value Ref Range   Iron 153 28 - 170 ug/dL   TIBC 315 250 - 450 ug/dL   Saturation Ratios 49 (H) 10.4 - 31.8 %   UIBC 162 ug/dL    Assessment & Plan:   Problem List Items Addressed This Visit   None Visit Diagnoses     Acute renal failure superimposed on stage 3b chronic kidney disease, unspecified acute renal failure type [facility])    -  Primary   Relevant Orders   Ambulatory referral to Nephrology   BMP8+EGFR       Recheck renal function today and do a referral to nephrology.  If still elevated may need to decrease her furosemide again.  Patient is also seeing a lymphedema clinic Follow up plan: Return if symptoms worsen or fail to improve, for 1 to 2-week recheck on renal function with  PCP.  Counseling provided for all of the vaccine components Orders Placed This Encounter  Procedures   BMP8+EGFR   Ambulatory referral to Nephrology    Caryl Pina, MD [facility] 02/10/2022, 8:59 AM

## 2022-02-10 NOTE — Patient Instructions (Signed)
Stacey Summers , Thank you for taking time to come for your Medicare Wellness Visit. I appreciate your ongoing commitment to your health goals. Please review the following plan we discussed and let me know if I can assist you in the future.   Screening recommendations/referrals: Colonoscopy: Done 08/07/2018 - GI declines repeat Mammogram: Done 12/15/2021 - Repeat annually  Bone Density: Done 06/26/2017 - Repeat every 2 years  Recommended yearly ophthalmology/optometry visit for glaucoma screening and checkup Recommended yearly dental visit for hygiene and checkup  Vaccinations: Influenza vaccine: recommended every fall Pneumococcal vaccine: Done 07/21/2014 & 09/21/2015      Tdap vaccine: Done 07/07/2009 - Repeat in 10 years *due Shingles vaccine: Done  04/29/2017 & 07/12/2017 Covid-19: Declined  Advanced directives: Advance directive discussed with you today. Even though you declined this today, please call our office should you change your mind, and we can give you the proper paperwork for you to fill out.   Conditions/risks identified: Aim for 30 minutes of exercise or brisk walking, 6-8 glasses of water, and 5 servings of fruits and vegetables each day.   Next appointment: Follow up in one year for your annual wellness visit    Preventive Care 65 Years and Older, Female Preventive care refers to lifestyle choices and visits with your health care provider that can promote health and wellness. What does preventive care include? A yearly physical exam. This is also called an annual well check. Dental exams once or twice a year. Routine eye exams. Ask your health care provider how often you should have your eyes checked. Personal lifestyle choices, including: Daily care of your teeth and gums. Regular physical activity. Eating a healthy diet. Avoiding tobacco and drug use. Limiting alcohol use. Practicing safe sex. Taking low-dose aspirin every day. Taking vitamin and mineral  supplements as recommended by your health care provider. What happens during an annual well check? The services and screenings done by your health care provider during your annual well check will depend on your age, overall health, lifestyle risk factors, and family history of disease. Counseling  Your health care provider may ask you questions about your: Alcohol use. Tobacco use. Drug use. Emotional well-being. Home and relationship well-being. Sexual activity. Eating habits. History of falls. Memory and ability to understand (cognition). Work and work Statistician. Reproductive health. Screening  You may have the following tests or measurements: Height, weight, and BMI. Blood pressure. Lipid and cholesterol levels. These may be checked every 5 years, or more frequently if you are over 62 years old. Skin check. Lung cancer screening. You may have this screening every year starting at age 67 if you have a 30-pack-year history of smoking and currently smoke or have quit within the past 15 years. Fecal occult blood test (FOBT) of the stool. You may have this test every year starting at age 49. Flexible sigmoidoscopy or colonoscopy. You may have a sigmoidoscopy every 5 years or a colonoscopy every 10 years starting at age 64. Hepatitis C blood test. Hepatitis B blood test. Sexually transmitted disease (STD) testing. Diabetes screening. This is done by checking your blood sugar (glucose) after you have not eaten for a while (fasting). You may have this done every 1-3 years. Bone density scan. This is done to screen for osteoporosis. You may have this done starting at age 15. Mammogram. This may be done every 1-2 years. Talk to your health care provider about how often you should have regular mammograms. Talk with your health care provider about  your test results, treatment options, and if necessary, the need for more tests. Vaccines  Your health care provider may recommend certain  vaccines, such as: Influenza vaccine. This is recommended every year. Tetanus, diphtheria, and acellular pertussis (Tdap, Td) vaccine. You may need a Td booster every 10 years. Zoster vaccine. You may need this after age 28. Pneumococcal 13-valent conjugate (PCV13) vaccine. One dose is recommended after age 76. Pneumococcal polysaccharide (PPSV23) vaccine. One dose is recommended after age 36. Talk to your health care provider about which screenings and vaccines you need and how often you need them. This information is not intended to replace advice given to you by your health care provider. Make sure you discuss any questions you have with your health care provider. Document Released: 08/28/2015 Document Revised: 04/20/2016 Document Reviewed: 06/02/2015 Elsevier Interactive Patient Education  2017 [place] Prevention in the Home Falls can cause injuries. They can happen to people of all ages. There are many things you can do to make your home safe and to help prevent falls. What can I do on the outside of my home? Regularly fix the edges of walkways and driveways and fix any cracks. Remove anything that might make you trip as you walk through a door, such as a raised step or threshold. Trim any bushes or trees on the path to your home. Use bright outdoor lighting. Clear any walking paths of anything that might make someone trip, such as rocks or tools. Regularly check to see if handrails are loose or broken. Make sure that both sides of any steps have handrails. Any raised decks and porches should have guardrails on the edges. Have any leaves, snow, or ice cleared regularly. Use sand or salt on walking paths during winter. Clean up any spills in your garage right away. This includes oil or grease spills. What can I do in the bathroom? Use night lights. Install grab bars by the toilet and in the tub and shower. Do not use towel bars as grab bars. Use non-skid mats or decals in  the tub or shower. If you need to sit down in the shower, use a plastic, non-slip stool. Keep the floor dry. Clean up any water that spills on the floor as soon as it happens. Remove soap buildup in the tub or shower regularly. Attach bath mats securely with double-sided non-slip rug tape. Do not have throw rugs and other things on the floor that can make you trip. What can I do in the bedroom? Use night lights. Make sure that you have a light by your bed that is easy to reach. Do not use any sheets or blankets that are too big for your bed. They should not hang down onto the floor. Have a firm chair that has side arms. You can use this for support while you get dressed. Do not have throw rugs and other things on the floor that can make you trip. What can I do in the kitchen? Clean up any spills right away. Avoid walking on wet floors. Keep items that you use a lot in easy-to-reach places. If you need to reach something above you, use a strong step stool that has a grab bar. Keep electrical cords out of the way. Do not use floor polish or wax that makes floors slippery. If you must use wax, use non-skid floor wax. Do not have throw rugs and other things on the floor that can make you trip. What can I do with  my stairs? Do not leave any items on the stairs. Make sure that there are handrails on both sides of the stairs and use them. Fix handrails that are broken or loose. Make sure that handrails are as long as the stairways. Check any carpeting to make sure that it is firmly attached to the stairs. Fix any carpet that is loose or worn. Avoid having throw rugs at the top or bottom of the stairs. If you do have throw rugs, attach them to the floor with carpet tape. Make sure that you have a light switch at the top of the stairs and the bottom of the stairs. If you do not have them, ask someone to add them for you. What else can I do to help prevent falls? Wear shoes that: Do not have high  heels. Have rubber bottoms. Are comfortable and fit you well. Are closed at the toe. Do not wear sandals. If you use a stepladder: Make sure that it is fully opened. Do not climb a closed stepladder. Make sure that both sides of the stepladder are locked into place. Ask someone to hold it for you, if possible. Clearly mark and make sure that you can see: Any grab bars or handrails. First and last steps. Where the edge of each step is. Use tools that help you move around (mobility aids) if they are needed. These include: Canes. Walkers. Scooters. Crutches. Turn on the lights when you go into a dark area. Replace any light bulbs as soon as they burn out. Set up your furniture so you have a clear path. Avoid moving your furniture around. If any of your floors are uneven, fix them. If there are any pets around you, be aware of where they are. Review your medicines with your doctor. Some medicines can make you feel dizzy. This can increase your chance of falling. Ask your doctor what other things that you can do to help prevent falls. This information is not intended to replace advice given to you by your health care provider. Make sure you discuss any questions you have with your health care provider. Document Released: 05/28/2009 Document Revised: 01/07/2016 Document Reviewed: 09/05/2014 Elsevier Interactive Patient Education  2017 [place] Directive  Advance directives are legal documents that allow you to make decisions about your health care and medical treatment in case you become unable to communicate for yourself. Advance directives let your wishes be known to family, friends, and health care providers. Discussing and writing advance directives should happen over time rather than all at once. Advance directives can be changed and updated at any time. There are different types of advance directives, such as: Medical power of attorney. Living will. Do not resuscitate  (DNR) order or do not attempt resuscitation (DNAR) order. Health care proxy and medical power of attorney A health care proxy is also called a health care agent. This person is appointed to make medical decisions for you when you are unable to make decisions for yourself. Generally, people ask a trusted friend or family member to act as their proxy and represent their preferences. Make sure you have an agreement with your trusted person to act as your proxy. A proxy may have to make a medical decision on your behalf if your wishes are not known. A medical power of attorney, also called a durable power of attorney for health care, is a legal document that names your health care proxy. Depending on the laws in your state, the  document may need to be: Signed. Notarized. Dated. Copied. Witnessed. Incorporated into your medical record. You may also want to appoint a trusted person to manage your money in the event you are unable to do so. This is called a durable power of attorney for finances. It is a separate legal document from the durable power of attorney for health care. You may choose your health care proxy or someone different to act as your agent in money matters. If you do not appoint a proxy, or there is a concern that the proxy is not acting in your best interest, a court may appoint a guardian to act on your behalf. Living will A living will is a set of instructions that state your wishes about medical care when you cannot express them yourself. Health care providers should keep a copy of your living will in your medical record. You may want to give a copy to family members or friends. To alert caregivers in case of an emergency, you can place a card in your wallet to let them know that you have a living will and where they can find it. A living will is used if you become: Terminally ill. Disabled. Unable to communicate or make decisions. The following decisions should be included in your  living will: To use or not to use life support equipment, such as dialysis machines and breathing machines (ventilators). Whether you want a DNR or DNAR order. This tells health care providers not to use cardiopulmonary resuscitation (CPR) if breathing or heartbeat stops. To use or not to use tube feeding. To be given or not to be given food and fluids. Whether you want comfort (palliative) care when the goal becomes comfort rather than a cure. Whether you want to donate your organs and tissues. A living will does not give instructions for distributing your money and property if you should pass away. DNR or DNAR A DNR or DNAR order is a request not to have CPR in the event that your heart stops beating or you stop breathing. If a DNR or DNAR order has not been made and shared, a health care provider will try to help any patient whose heart has stopped or who has stopped breathing. If you plan to have surgery, talk with your health care provider about how your DNR or DNAR order will be followed if problems occur. What if I do not have an advance directive? Some states assign family decision makers to act on your behalf if you do not have an advance directive. Each state has its own laws about advance directives. You may want to check with your health care provider, attorney, or state representative about the laws in your state. Summary Advance directives are legal documents that allow you to make decisions about your health care and medical treatment in case you become unable to communicate for yourself. The process of discussing and writing advance directives should happen over time. You can change and update advance directives at any time. Advance directives may include a medical power of attorney, a living will, and a DNR or DNAR order. This information is not intended to replace advice given to you by your health care provider. Make sure you discuss any questions you have with your health care  provider. Document Revised: 05/05/2020 Document Reviewed: 05/05/2020 Elsevier Patient Education  [place].

## 2022-02-11 ENCOUNTER — Ambulatory Visit (HOSPITAL_COMMUNITY): Payer: PPO | Admitting: Physical Therapy

## 2022-02-11 ENCOUNTER — Ambulatory Visit (INDEPENDENT_AMBULATORY_CARE_PROVIDER_SITE_OTHER): Payer: PPO | Admitting: Gastroenterology

## 2022-02-11 ENCOUNTER — Encounter (HOSPITAL_COMMUNITY): Payer: PPO | Admitting: Physical Therapy

## 2022-02-11 ENCOUNTER — Encounter (HOSPITAL_COMMUNITY): Payer: Self-pay | Admitting: Physical Therapy

## 2022-02-11 VITALS — BP 132/64 | HR 70 | Ht 62.0 in | Wt 252.0 lb

## 2022-02-11 DIAGNOSIS — K7460 Unspecified cirrhosis of liver: Secondary | ICD-10-CM

## 2022-02-11 DIAGNOSIS — R600 Localized edema: Secondary | ICD-10-CM | POA: Diagnosis not present

## 2022-02-11 DIAGNOSIS — K7682 Hepatic encephalopathy: Secondary | ICD-10-CM

## 2022-02-11 DIAGNOSIS — K7581 Nonalcoholic steatohepatitis (NASH): Secondary | ICD-10-CM | POA: Diagnosis not present

## 2022-02-11 DIAGNOSIS — N183 Chronic kidney disease, stage 3 unspecified: Secondary | ICD-10-CM

## 2022-02-11 DIAGNOSIS — I890 Lymphedema, not elsewhere classified: Secondary | ICD-10-CM | POA: Diagnosis not present

## 2022-02-11 LAB — METHYLMALONIC ACID, SERUM: Methylmalonic Acid, Quantitative: 531 nmol/L — ABNORMAL HIGH (ref 0–378)

## 2022-02-11 LAB — BMP8+EGFR
BUN/Creatinine Ratio: 29 — ABNORMAL HIGH (ref 12–28)
BUN: 58 mg/dL — ABNORMAL HIGH (ref 8–27)
CO2: 26 mmol/L (ref 20–29)
Calcium: 9.6 mg/dL (ref 8.7–10.3)
Chloride: 105 mmol/L (ref 96–106)
Creatinine, Ser: 2 mg/dL — ABNORMAL HIGH (ref 0.57–1.00)
Glucose: 163 mg/dL — ABNORMAL HIGH (ref 70–99)
Potassium: 5.6 mmol/L — ABNORMAL HIGH (ref 3.5–5.2)
Sodium: 144 mmol/L (ref 134–144)
eGFR: 26 mL/min/{1.73_m2} — ABNORMAL LOW (ref >59)

## 2022-02-11 NOTE — Patient Instructions (Signed)
You will need to call back to schedule a follow up for October after you see the Nephrologist   If you are age 74 or older, your body mass index should be between 23-30. Your Body mass index is 46.09 kg/m. If this is out of the aforementioned range listed, please consider follow up with your Primary Care Provider.  If you are age 49 or younger, your body mass index should be between 19-25. Your Body mass index is 46.09 kg/m. If this is out of the aformentioned range listed, please consider follow up with your Primary Care Provider.   ________________________________________________________  The  GI providers would like to encourage you to use [facility] to communicate with providers for non-urgent requests or questions.  Due to long hold times on the telephone, sending your provider a message by [facility] may be a faster and more efficient way to get a response.  Please allow 48 business hours for a response.  Please remember that this is for non-urgent requests.  _______________________________________________________   I appreciate the  opportunity to care for you  Thank You   Harl Bowie , MD

## 2022-02-11 NOTE — Therapy (Signed)
OUTPATIENT PHYSICAL THERAPY  Lymphedema Treatment  Patient Name: Stacey Summers MRN: [id number] DOB:Mar 05, 1946, 76 y.o., female Today's Date: 02/11/2022   PT End of Session - 02/11/22 1655     Visit Number 5    Number of Visits 18    Date for PT Re-Evaluation 03/16/22    Authorization Type [employer].    Progress Note Due on Visit 10    PT Start Time 1530    PT Stop Time 1650    PT Time Calculation (min) 80 min    Activity Tolerance Patient tolerated treatment well    Behavior During Therapy WFL for tasks assessed/performed              Past Medical History:  Diagnosis Date   Allergy    seasonal   Anxiety    Arthritis    hands, back    B12 deficiency 03/31/2021   Cataract    surgery to remove   Chronic back pain    lower back    Cirrhosis of liver without ascites ([place])    recent dx 07/11/18   Closed fracture of right tibial plateau 01/31/2018   Complex tear of medial meniscus of right knee 03/27/2018   COPD (chronic obstructive pulmonary disease) ([place])    no inhaler   Diabetes mellitus without complication (HCC)    DJD (degenerative joint disease)    Fibromyalgia    Genetic testing 03/08/2017   Ms. Troublefield underwent genetic counseling and testing for hereditary cancer syndromes on 02/21/2017. Her results were negative for mutations in all 83 genes analyzed by [facility]'s 83-gene Multi-Cancers Panel. 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   History of abnormal mammogram    History of arthroplasty of right knee 07/06/2018   Hyperlipidemia    Hypertension    Irregular heart beat    Neuromuscular disorder (HCC)    fibromyalgia   Sleep apnea    cpap some   Stress fracture of right tibia 03/27/2018   SVD (spontaneous  vaginal delivery)    x 2   Tubular adenoma of colon 05/09/12   "innumerable number" of flat polyps   Past Surgical History:  Procedure Laterality Date   CARPAL TUNNEL RELEASE Left 10/20/14   [facility]   CATARACT EXTRACTION, BILATERAL     CESAREAN SECTION  1977   CHOLECYSTECTOMY  1990   COLONOSCOPY     IR RADIOLOGIST EVAL & MGMT  07/16/2019   KNEE ARTHROSCOPY WITH SUBCHONDROPLASTY Right 03/27/2018   Procedure: Right knee arthroscopic partial medial meniscus repair versus menisectomy with medial tibia subchondroplasty;  Surgeon: Nicholes Stairs, MD;  Location: [place];  Service: Orthopedics;  Laterality: Right;  120 mins   LARYNX SURGERY     POLYP REMOVAL   MULTIPLE TOOTH EXTRACTIONS     POLYPECTOMY     vocal cord   TONSILLECTOMY  1965   TUBAL LIGATION     TUMOR REMOVAL     benign fatty tumors on abdomen   UPPER GASTROINTESTINAL ENDOSCOPY     Patient Active Problem List   Diagnosis Date Noted   B12 deficiency 03/31/2021   Left shoulder pain 08/12/2020   Left knee pain 08/12/2020   Encephalopathy, hepatic ([place]) 08/01/2019   Thrombocytopenia ([place]) 07/05/2019   Hyperlipidemia associated  with type 2 diabetes mellitus ([place]) 04/02/2019   Liver cirrhosis secondary to NASH ([place]) 07/11/2018   Degeneration of lumbar intervertebral disc 12/28/2017   Lumbar radiculopathy 12/28/2017   Spondylolisthesis, grade 1 12/28/2017   Chronic pain of right knee 12/19/2017   Genetic testing 03/08/2017   Restrictive lung disease secondary to obesity 08/04/2015   Physical deconditioning 08/04/2015   OSA treated with BiPAP 08/04/2015   Gastroesophageal reflux disease without esophagitis 12/25/2014   Low serum vitamin D 05/28/2014   Osteopenia 05/28/2014   COPD (chronic obstructive pulmonary disease) ([place]) 01/29/2014   Dyspnea on exertion 01/07/2014   Fatigue 03/07/13   Murmur, cardiac Mar 07, 2013   Diabetes ([place]) 03/07/2013   Morbid obesity ([place]) 03-07-13   Family history of sudden death  03-07-2013   Hypertension associated with diabetes ([place]) 12/20/2012   GAD (generalized anxiety disorder) 12/20/2012   Fibromyalgia muscle pain 12/20/2012   SUBJECTIVE                                                                                                                                                                                            CMK:LKJZPH [name]  REFERRING PROVIDER: Adam Phenix  REFERRING DIAG: [phone] (ICD-10-CM) - Liver cirrhosis secondary to NASH ([place]) R60.0 (ICD-10-CM) - Bilateral lower extremity edema   THERAPY DIAG:  R60.0 (ICD-10-CM) - Bilateral lower extremity edema    Rationale for Evaluation and Treatment Rehabilitation  ONSET DATE: chronic   SUBJECTIVE:   SUBJECTIVE STATEMENT:  Pt states that she got her bandages, pt ordered LE box from [email or web address].  Pt states when she took the bandages off this morning she could tell that her foot was down significantly.  She has had two MD appointments and her legs are up again.   PERTINENT HISTORY: Morbid obesity, obstructive sleep apnea, essential hypertension, hyperlipidemia and chronic leg edema.  PT has low back pain, fibromyalgia.     PAIN:  Are you having pain? Yes back pt has chronic back pain we are not treating or addressing this. Pt states she needs surgery but MD will not complete due to her medical history.   PRECAUTIONS: Other: cellulitis  WEIGHT BEARING RESTRICTIONS No  FALLS:  Has patient fallen in last 6 months? No  LIVING ENVIRONMENT: Lives with: lives with their family Lives in: House/apartment Stairs: No Has following equipment at home: Single point cane and Environmental consultant - 2 wheeled  OCCUPATION: retired   PLOF: Independent with basic ADLs  PATIENT GOALS to move better    OBJECTIVE  COGNITION:  Overall cognitive status: Within functional limits for tasks assessed   PALPATION: Noted induration B    LYMPHEDEMA  ASSESSMENTS:       LE LANDMARK  RIGHT Eval 6/21 Rt  6/28  At groin    30 cm proximal to suprapatella    20 cm proximal to suprapatella  77 79  10 cm proximal to suprapatella  65.5 68.3  At midpatella / popliteal crease  54.8 53  30 cm proximal to floor at lateral plantar foot  54.4 51  20 cm proximal to floor at lateral plantar foot  41.1 40.3  10 cm proximal to floor at lateral plantar foot  29.8 29.3  Circumference of ankle/heel  36.7 35.4  5 cm proximal to 1st MTP joint  25.8 25  Across MTP joint  26.2 25.6  Around proximal great toe     (Blank rows = not tested)  LE LANDMARK LEFT Eval 6/21 Lt  6/28  At groin    30 cm proximal to suprapatella    20 cm proximal to suprapatella  75 73  10 cm proximal to suprapatella  67 66  At midpatella / popliteal crease  57.5 57  30 cm proximal to floor at lateral plantar foot  52 55  20 cm proximal to floor at lateral plantar foot  40.7 45.5  10 cm proximal to floor at lateral plantar foot  31.4 30.8  Circumference of ankle/heel  36 35.8  5 cm proximal to 1st MTP joint  25.8 26  Across MTP joint 25.2 25.4  Around proximal great toe    (Blank rows = not tested)    TODAY'S TREATMENT               6/30:                           Manual decongestive massage for bil LE's including short neck, deep and superficial abdominal, spinal nodes, inguinal and axillary node activation.  Bilateral inguinal-axillary anastomosis supine anterior, sidelying for posterior for bilateral LE's. PT cut foam for B thighs.  Pt was bandaged using multilayer short stretch bandaging and 1/2" foam.  Therapist modified to have as many bandages as are typically used using two six cm for foot and ankle one 8 cm  and 2- 10 cm for LE; one - 10 cm and 3 -10 cm  for thigh area.              02/09/22:              Manual decongestive massage for bil LE's including short neck, deep and superficial abdominal, spinal nodes, inguinal and axillary node activation.  Bilateral inguinal-axillary     anastomosis  supine anterior, sidelying for posterior for bilateral LE's. Short stretch bandaging using one six and two 8 cm short stretch bandages as well as 1/2 inch foam.  These were taken from departmental stock and we will replace with pt's bandages and continue with full bandaging next treatment when pt brings in her bandages.   02/07/22: Manual decongestive massage for bil LE's including short neck, deep and superficial abdominal, spinal nodes, inguinal and axillary node activation.  Bilateral inguinal-axillary anastomosis supine anterior, sidelying for posterior for bilateral LE's. Education: as above to both patient and CG regarding lymphedema.  Noted area on Lt anterior LE that pt states has been there a while that needs to be looked at by dermatologist.  02/04/22: Reviewed goals, educated importance of HEP compliance for maximal benefits, educated 4 aspects of lymph therapy Therex: LAQ, ankle pumps, adduction, abduction, marching,  squeeze 10x each Manual lymphedema decongestive with LE elevated on wedge in supine included: short neck, superficial and deep abdominal, inguinal to axillary anastomoses, sidelying for posterior. 6/21: Explained to pt what lymphedema is, that it can not be cured but can be controlled and the four aspects of treatment.   PATIENT EDUCATION:  Education details: Reviewed goals, educated 4 components of lymph care: skin, exercise, manual and compression, reviewed HEP Eval: HEP Person educated: Patient Education method: Explanation, Verbal cues, and Handouts Education comprehension: returned demonstration and needs further education   HOME EXERCISE PROGRAM: Ankle pumps, LAQ, hip ab/adduction, marching, diaphragm breath, lymphatic squeeze.  ASSESSMENT:  CLINICAL IMPRESSION: Pt measured with ; some measurements were up while others were down.  Therapist  noted induration in posterior aspect of B LE as well as posterior distal aspect of B thighs.    OBJECTIVE IMPAIRMENTS  Abnormal gait, decreased activity tolerance, difficulty walking, increased edema, and decreased skin integrity. .   ACTIVITY LIMITATIONS carrying, lifting, dressing, and locomotion level  PARTICIPATION LIMITATIONS: shopping and community activity  PERSONAL FACTORS Age, Fitness, and Time since onset of injury/illness/exacerbation are also affecting patient's functional outcome.   REHAB POTENTIAL: Good  CLINICAL DECISION MAKING: Evolving/moderate complexity  EVALUATION COMPLEXITY: Moderate  GOALS: Goals reviewed with patient? No  SHORT TERM GOALS: Target date: 03/04/2022    PT to be completing HEP at least daily to assist in increasing lymphatic circulation  Baseline: Goal status: MET  2.  Pt to be completing self manual techniques at home to assist in lymphatic circulation  Baseline:  Goal status: IN PROGRESS  3.  PT to have lost 2-3 cm in thigh and LE measurements, (not foot) Baseline:  Goal status: IN PROGRESS    LONG TERM GOALS: Target date: 03/16/22   PT to have received and be using a compression pump daily when short stretch bandaging is not donned. Baseline:  Goal status: IN PROGRESS  2.  PT to have lost between 3-4 cm in thigh and LE measurements to reduce risk of cellulitis. Baseline:  Goal status: IN PROGRESS  3.  Pt to be able to state the maintenance phase of lymphedema. Baseline:  Goal status: IN PROGRESS PLAN: PT FREQUENCY: 3x/week  PT DURATION: 6 weeks  PLANNED INTERVENTIONS: Therapeutic exercises, Patient/Family education, Manual lymph drainage, Compression bandaging, and Manual therapy  PLAN FOR NEXT SESSION:  Review self manual and give sheet to assure correct mechanics.  Assess how pt did with full bandaging,  Measure on Wed.   Rayetta Humphrey, [place] CLT [phone]  02/11/2022, 17:05

## 2022-02-11 NOTE — Progress Notes (Unsigned)
Stacey Summers    [id number]    10/03/1945  Primary Care Physician:Gottschalk, Stacey Distance, DO  Referring Physician: Janora Norlander, DO Stacey Summers,  [place] [id number]   Chief complaint:  Cirrhosis, leg swelling  HPI:  76 year old very pleasant female with morbid obesity, Stacey Summers cirrhosis decompensated by hepatic encephalopathy here for follow-up visit accompanied by her daughter.  She has worsening congestive heart failure, had increase in diuretics dose with worsening renal function.  Creatinine 2.0 She has significant lower extremity swelling     Overall symptoms are stable from GI /liver standpoint but there are days when she has decreased energy and continues to have days where she sleeps more during daytime. She continues to have intermittent episodes of confusion, mood changes and changes in sleep pattern.  She tends to sleep during daytime as she has difficulty falling asleep at night.  According to patient she sleeps during the day because she is bored and there is nothing else to do   Denies any worsening confusion, abdominal distention, melena or rectal bleeding. No vomiting, abdominal bloating or weight gain.    GI/Liver Hx   She was [facility] and [facility] transplant hepatology, not a candidate for liver transplant please see full note in Care Everywhere for details   EGD 08/20/19: - Grade I and small (< 5 mm) esophageal varices. - No gross lesions in esophagus. - Portal hypertensive gastropathy. - Normal examined duodenum.   EGD 08/07/2018: - The Z-line was regular and was found 36 cm from the incisors. - No gross lesions were noted in the entire esophagus. No esophageal varices. - The stomach was normal. No gastric varices. - The examined duodenum was normal.   Colonoscopy 08/07/2018 - Four 4 to 9 mm polyps in the transverse colon and in the ascending colon, removed with a cold snare. Resected and retrieved. -  Diverticulosis in the sigmoid colon. - Non-bleeding internal hemorrhoids.   Relevant history: History of COVID-19 infection November 2020.  Worsening mental status with confusion and somnolence post Covid.   She has prominent splenorenal shunt, was referred to IR to discuss possible embolization.  But given she has significant improvement of symptoms with Xifaxan twice daily, family and patient preferred to hold off the embolization.       Outpatient Encounter Medications as of 02/11/2022  Medication Sig   albuterol (VENTOLIN HFA) 108 (90 Base) MCG/ACT inhaler Inhale 2 puffs into the lungs every 6 (six) hours as needed for wheezing or shortness of breath.   ALPRAZolam (XANAX) 0.25 MG tablet    atorvastatin (LIPITOR) 20 MG tablet Take 1 tablet (20 mg total) by mouth daily.   benazepril (LOTENSIN) 20 MG tablet Take 1 tablet (20 mg total) by mouth daily. (NEEDS TO BE SEEN BEFORE NEXT REFILL)   Biotin 5000 MCG SUBL Place under the tongue.   blood glucose meter kit and supplies KIT Dispense based on patient and insurance preference. Use up to four times daily as directed. (FOR ICD-9 250.00, 250.01).   Blood Glucose Monitoring Suppl (ONETOUCH VERIO FLEX SYSTEM) w/Device KIT Check Bs up to 4 times daily Dx E11.9,   Cholecalciferol (VITAMIN D3) 5000 units CAPS Take 5,000 Units by mouth daily.   cyclobenzaprine (FLEXERIL) 5 MG tablet TAKE 1 TABLET BY MOUTH THREE TIMES DAILY AS NEEDED FOR MUSCLE SPASM (NEEDS  APPOINTMENT)   diltiazem (CARDIZEM CD) 120 MG 24 hr capsule Take 1 capsule by  mouth once daily   ferrous sulfate 325 (65 FE) MG EC tablet Take 1 tablet (325 mg total) by mouth daily.   fluticasone (FLONASE) 50 MCG/ACT nasal spray USE 1 SPRAY(S) IN EACH NOSTRIL IN THE MORNING AND AT BEDTIME   furosemide (LASIX) 40 MG tablet Take 2 tablet ( total 80 mg)in the morning  and 40 mg in the afternoon   glucose blood (ONETOUCH VERIO) test strip CHECK GLUCOSE UP TO 4 TIMES DAILY Dx E11.69   lactulose  (CHRONULAC) 10 GM/15ML solution TAKE 15 ML BY MOUTH  THREE TIMES DAILY AS NEEDED   LORazepam (ATIVAN) 0.5 MG tablet Take 0.5-1 tablets (0.25-0.5 mg total) by mouth 2 (two) times daily as needed for anxiety.   meclizine (ANTIVERT) 25 MG tablet Take 1 tablet (25 mg total) by mouth 3 (three) times daily as needed for dizziness.   metFORMIN (GLUCOPHAGE) 500 MG tablet Take 1 tablet (500 mg total) by mouth daily with breakfast.   omeprazole (PRILOSEC) 40 MG capsule    ondansetron (ZOFRAN-ODT) 4 MG disintegrating tablet Take 1 tablet (4 mg total) by mouth every 6 (six) hours as needed for nausea or vomiting.   ONETOUCH DELICA LANCETS 20U MISC USE ONE LANCET TO CHECK GLUCOSE ONCE DAILY AND AS NEEDED   spironolactone (ALDACTONE) 25 MG tablet Take 1 tablet (25 mg total) by mouth daily.   traMADol (ULTRAM) 50 MG tablet Take 1 tablet (50 mg total) by mouth every 12 (twelve) hours as needed for moderate pain.   valACYclovir (VALTREX) 1000 MG tablet valacyclovir 1 gram tablet   No facility-administered encounter medications on file as of 02/11/2022.    Allergies as of 02/11/2022 - Review Complete 02/11/2022  Allergen Reaction Noted   Latex  03/27/2018    Past Medical History:  Diagnosis Date   Allergy    seasonal   Anxiety    Arthritis    hands, back    B12 deficiency 03/31/2021   Cataract    surgery to remove   Chronic back pain    lower back    Cirrhosis of liver without ascites ([place])    recent dx 07/11/18   Closed fracture of right tibial plateau 01/31/2018   Complex tear of medial meniscus of right knee 03/27/2018   COPD (chronic obstructive pulmonary disease) ([place])    no inhaler   Diabetes mellitus without complication (HCC)    DJD (degenerative joint disease)    Fibromyalgia    Genetic testing 03/08/2017   Stacey Summers underwent genetic counseling and testing for hereditary cancer syndromes on 02/21/2017. Her results were negative for mutations in all 83 genes analyzed by [facility]'s  83-gene Multi-Cancers Panel. 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   History of abnormal mammogram    History of arthroplasty of right knee 07/06/2018   Hyperlipidemia    Hypertension    Irregular heart beat    Neuromuscular disorder (HCC)    fibromyalgia   Sleep apnea    cpap some   Stress fracture of right tibia 03/27/2018   SVD (spontaneous vaginal delivery)    x 2   Tubular adenoma of colon 05/09/12   "innumerable number" of flat polyps    Past Surgical History:  Procedure Laterality Date   CARPAL TUNNEL RELEASE Left  10/20/14   [facility]   CATARACT EXTRACTION, BILATERAL     CESAREAN SECTION  1977   CHOLECYSTECTOMY  1990   COLONOSCOPY     IR RADIOLOGIST EVAL & MGMT  07/16/2019   KNEE ARTHROSCOPY WITH SUBCHONDROPLASTY Right 03/27/2018   Procedure: Right knee arthroscopic partial medial meniscus repair versus menisectomy with medial tibia subchondroplasty;  Surgeon: Nicholes Stairs, MD;  Location: [place];  Service: Orthopedics;  Laterality: Right;  120 mins   LARYNX SURGERY     POLYP REMOVAL   MULTIPLE TOOTH EXTRACTIONS     POLYPECTOMY     vocal cord   TONSILLECTOMY  1965   TUBAL LIGATION     TUMOR REMOVAL     benign fatty tumors on abdomen   UPPER GASTROINTESTINAL ENDOSCOPY      Family History  Problem Relation Age of Onset   Heart failure Mother        d.37   Heart disease Mother    Prostate cancer Father        d.89 diagnosed in late-60s   Colon cancer Father        diagnosed at 45   Leukemia Maternal Aunt    Heart failure Maternal Uncle        d.50   Stomach cancer Paternal Aunt        questionable   Colon cancer Paternal Uncle        in his 49s   Heart failure Maternal Grandmother        d.45   Kidney cancer  Cousin        d.78s maternal first-cousin   Colon polyps Brother        polyposis. Underwent partial colectomy at age 51.   Spina bifida Brother        died at birth   Colon polyps Son 90   Heart attack Son 29   Other Other 47       multiple lipomas   Esophageal cancer Neg Hx    Rectal cancer Neg Hx    Breast cancer Neg Hx     Social History   Socioeconomic History   Marital status: Married    Spouse name: [name] "goes by [employer]"   Number of children: 3   Years of education: 11   Highest education level: GED or equivalent  Occupational History   Occupation: retired  Tobacco Use   Smoking status: Former    Packs/day: 1.00    Years: 3.00    Total pack years: 3.00    Types: Cigarettes    Quit date: 08/15/1969    Years since quitting: 52.5   Smokeless tobacco: Never  Vaping Use   Vaping Use: Never used  Substance and Sexual Activity   Alcohol use: No    Alcohol/week: 0.0 standard drinks of alcohol   Drug use: No   Sexual activity: Not Currently  Other Topics Concern   Not on file  Social History Narrative   Daughter lives with them and helps a lot   Social Determinants of Health   Financial Resource Strain: Low Risk  (02/10/2022)   Overall Financial Resource Strain (CARDIA)    Difficulty of Paying Living Expenses: Not hard at all  Food Insecurity: No Food Insecurity (02/10/2022)   Hunger Vital Sign    Worried About Running Out of Food in the Last Year: Never true    Ran Out of Food in the Last Year: Never true  Transportation Needs: No Transportation Needs (02/10/2022)  PRAPARE - Hydrologist (Medical): No    Lack of Transportation (Non-Medical): No  Physical Activity: Inactive (02/10/2022)   Exercise Vital Sign    Days of Exercise per Week: 0 days    Minutes of Exercise per Session: 0 min  Stress: No Stress Concern Present (02/10/2022)   [place]    Feeling of Stress  : Only a little  Social Connections: Socially Integrated (02/10/2022)   Social Connection and Isolation Panel [NHANES]    Frequency of Communication with Friends and Family: More than three times a week    Frequency of Social Gatherings with Friends and Family: More than three times a week    Attends Religious Services: More than 4 times per year    Active Member of [employer] or Organizations: Yes    Attends Music therapist: More than 4 times per year    Marital Status: Married  Human resources officer Violence: Not At Risk (02/10/2022)   Humiliation, Afraid, Rape, and Kick questionnaire    Fear of Current or Ex-Partner: No    Emotionally Abused: No    Physically Abused: No    Sexually Abused: No      Review of systems: All other review of systems negative except as mentioned in the HPI.   Physical Exam: Vitals:   02/11/22 1103  BP: 132/64  Pulse: 70   Body mass index is 46.09 kg/m. Gen:      No acute distress HEENT:  sclera anicteric Abd:      soft, non-tender; no palpable masses, no distension Ext:    No edema Neuro: alert and oriented x 3 Psych: normal mood and affect  Data Reviewed:  Reviewed labs, radiology imaging, old records and pertinent past GI work up   Assessment and Plan/Recommendations:  76 year old very pleasant with history of Stacey Summers cirrhosis decompensated with hepatic encephalopathy Worsening renal function, CKD stage III  MELD-Na: 14   Worsening renal function: She has referral to nephrology, diuretic dose was decreased by her PMD  History of advanced adenomatous colon polyps Discussed with patient that given her age and comorbidities with cirrhosis, thrombocytopenia and coagulopathy, is at significant risk for complications associated with procedure and anesthesia.  Do not recommend ongoing surveillance colonoscopy.  Her daughter was in agreement that her potential risk is significantly higher than benefit .  Patient continues to push for  colonoscopy, has lack of insight.  She feels if her liver function or clinical condition worsens she may be eligible for transplant sooner.  I informed patient that we can transfer her medical records if she would like to seek second opinion elsewhere.  Chronic fatigue secondary to comorbid conditions including cirrhosis Vitamin D and B12 level within normal range   Hepatocellular screening: No hepatic lesions concerning for [facility] on imaging January 2023.  AFP within normal range   Chronic thrombocytopenia: Secondary to portal hypertension in the setting of [place] cirrhosis   Volume status: Stable Currently on low-dose Lasix for peripheral edema   Hepatic encephalopathy: Continue Xifaxan twice daily and lactulose as needed with goal 3 soft bowel movements daily   Small esophageal varices on EGD in January 2021    Continue with low-carb and low-fat diet, avoid artificial sweeteners, high fructose corn syrup. Daily exercise as tolerated   Return in 3 months   The patient was provided an opportunity to ask questions and all were answered. The patient agreed with the plan and  demonstrated an understanding of the instructions.  Damaris Hippo , MD    CC: Stacey Norlander, DO

## 2022-02-14 ENCOUNTER — Ambulatory Visit (HOSPITAL_COMMUNITY): Payer: PPO | Attending: Family Medicine | Admitting: Physical Therapy

## 2022-02-14 ENCOUNTER — Encounter: Payer: Self-pay | Admitting: Gastroenterology

## 2022-02-14 ENCOUNTER — Ambulatory Visit (HOSPITAL_COMMUNITY): Payer: PPO | Admitting: Physician Assistant

## 2022-02-14 ENCOUNTER — Encounter (HOSPITAL_COMMUNITY): Payer: PPO | Admitting: Physical Therapy

## 2022-02-14 DIAGNOSIS — R29898 Other symptoms and signs involving the musculoskeletal system: Secondary | ICD-10-CM | POA: Diagnosis not present

## 2022-02-14 DIAGNOSIS — I890 Lymphedema, not elsewhere classified: Secondary | ICD-10-CM | POA: Insufficient documentation

## 2022-02-14 NOTE — Therapy (Signed)
OUTPATIENT PHYSICAL THERAPY  Lymphedema Treatment  Patient Name: Stacey Summers MRN: [id number] DOB:[birth year], 76 y.o., female Today's Date: 02/14/2022   PT End of Session - 02/14/22 1556     Visit Number 6    Number of Visits 18    Date for PT Re-Evaluation 03/16/22    Authorization Type [employer].    Progress Note Due on Visit 10    PT Start Time 1600    PT Stop Time 1713    PT Time Calculation (min) 73 min    Activity Tolerance Patient tolerated treatment well    Behavior During Therapy WFL for tasks assessed/performed              Past Medical History:  Diagnosis Date   Allergy    seasonal   Anxiety    Arthritis    hands, back    B12 deficiency 03/31/2021   Cataract    surgery to remove   Chronic back pain    lower back    Cirrhosis of liver without ascites ([place])    recent dx 07/11/18   Closed fracture of right tibial plateau 01/31/2018   Complex tear of medial meniscus of right knee 03/27/2018   COPD (chronic obstructive pulmonary disease) (HCC)    no inhaler   Diabetes mellitus without complication (HCC)    DJD (degenerative joint disease)    Fibromyalgia    Genetic testing 03/08/2017   [name] underwent genetic counseling and testing for hereditary cancer syndromes on 02/21/2017. Her results were negative for mutations in all 83 genes analyzed by [facility]'s 83-gene Multi-Cancers Panel. 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   History of abnormal mammogram    History of arthroplasty of right knee 07/06/2018   Hyperlipidemia    Hypertension    Irregular heart beat    Neuromuscular disorder (HCC)    fibromyalgia   Sleep apnea    cpap some   Stress fracture of right tibia 03/27/2018   SVD (spontaneous  vaginal delivery)    x 2   Tubular adenoma of colon 05/09/12   "innumerable number" of flat polyps   Past Surgical History:  Procedure Laterality Date   CARPAL TUNNEL RELEASE Left 10/20/14   [facility]   CATARACT EXTRACTION, BILATERAL     CESAREAN SECTION  1977   CHOLECYSTECTOMY  1990   COLONOSCOPY     IR RADIOLOGIST EVAL & MGMT  07/16/2019   KNEE ARTHROSCOPY WITH SUBCHONDROPLASTY Right 03/27/2018   Procedure: Right knee arthroscopic partial medial meniscus repair versus menisectomy with medial tibia subchondroplasty;  Surgeon: Nicholes Stairs, MD;  Location: [facility];  Service: Orthopedics;  Laterality: Right;  120 mins   LARYNX SURGERY     POLYP REMOVAL   MULTIPLE TOOTH EXTRACTIONS     POLYPECTOMY     vocal cord   TONSILLECTOMY  1965   TUBAL LIGATION     TUMOR REMOVAL     benign fatty tumors on abdomen   UPPER GASTROINTESTINAL ENDOSCOPY     Patient Active Problem List   Diagnosis Date Noted   B12 deficiency 03/31/2021   Left shoulder pain 08/12/2020   Left knee pain 08/12/2020   Encephalopathy, hepatic ([place]) 08/01/2019   Thrombocytopenia ([place]) 07/05/2019   Hyperlipidemia associated  with type 2 diabetes mellitus ([place]) 04/02/2019   Liver cirrhosis secondary to NASH ([place]) 07/11/2018   Degeneration of lumbar intervertebral disc 12/28/2017   Lumbar radiculopathy 12/28/2017   Spondylolisthesis, grade 1 12/28/2017   Chronic pain of right knee 12/19/2017   Genetic testing 03/08/2017   Restrictive lung disease secondary to obesity 08/04/2015   Physical deconditioning 08/04/2015   OSA treated with BiPAP 08/04/2015   Gastroesophageal reflux disease without esophagitis 12/25/2014   Low serum vitamin D 05/28/2014   Osteopenia 05/28/2014   COPD (chronic obstructive pulmonary disease) ([place]) 01/29/2014   Dyspnea on exertion 01/07/2014   Fatigue 03-24-2013   Murmur, cardiac 24-Mar-2013   Diabetes ([place]) 24-Mar-2013   Morbid obesity ([place]) March 24, 2013   Family history of sudden death  March 24, 2013   Hypertension associated with diabetes ([place]) 12/20/2012   GAD (generalized anxiety disorder) 12/20/2012   Fibromyalgia muscle pain 12/20/2012   SUBJECTIVE                                                                                                                                                                                            TLX:BWIOMB Stacey Summers  REFERRING PROVIDER: Adam Phenix  REFERRING DIAG: [phone] (ICD-10-CM) - Liver cirrhosis secondary to NASH ([place]) R60.0 (ICD-10-CM) - Bilateral lower extremity edema   THERAPY DIAG:  R60.0 (ICD-10-CM) - Bilateral lower extremity edema    Rationale for Evaluation and Treatment Rehabilitation  ONSET DATE: chronic   SUBJECTIVE:   SUBJECTIVE STATEMENT:  PT states that the bandages were miserable.  She did notice a big difference when she took them off, however, she also noticed that in the few hours between when she took them off and her appointment she noticed that they have already started going back up.    PERTINENT HISTORY: Morbid obesity, obstructive sleep apnea, essential hypertension, hyperlipidemia and chronic leg edema.  PT has low back pain, fibromyalgia.     PAIN:  Are you having pain? Yes back pt has chronic back pain we are not treating or addressing this. Pt states she needs surgery but MD will not complete due to her medical history.   PRECAUTIONS: Other: cellulitis  WEIGHT BEARING RESTRICTIONS No  FALLS:  Has patient fallen in last 6 months? No  LIVING ENVIRONMENT: Lives with: lives with their family Lives in: House/apartment Stairs: No Has following equipment at home: Single point cane and Environmental consultant - 2 wheeled  OCCUPATION: retired   PLOF: Independent with basic ADLs  PATIENT GOALS to move better    OBJECTIVE  COGNITION:  Overall cognitive status: Within functional limits for tasks assessed   PALPATION: Noted induration B  LYMPHEDEMA ASSESSMENTS:       LE  LANDMARK RIGHT Eval 6/21 Rt  6/28  At groin    30 cm proximal to suprapatella    20 cm proximal to suprapatella  77 79  10 cm proximal to suprapatella  65.5 68.3  At midpatella / popliteal crease  54.8 53  30 cm proximal to floor at lateral plantar foot  54.4 51  20 cm proximal to floor at lateral plantar foot  41.1 40.3  10 cm proximal to floor at lateral plantar foot  29.8 29.3  Circumference of ankle/heel  36.7 35.4  5 cm proximal to 1st MTP joint  25.8 25  Across MTP joint  26.2 25.6  Around proximal great toe     (Blank rows = not tested)  LE LANDMARK LEFT Eval 6/21 Lt  6/28  At groin    30 cm proximal to suprapatella    20 cm proximal to suprapatella  75 73  10 cm proximal to suprapatella  67 66  At midpatella / popliteal crease  57.5 57  30 cm proximal to floor at lateral plantar foot  52 55  20 cm proximal to floor at lateral plantar foot  40.7 45.5  10 cm proximal to floor at lateral plantar foot  31.4 30.8  Circumference of ankle/heel  36 35.8  5 cm proximal to 1st MTP joint  25.8 26  Across MTP joint 25.2 25.4  Around proximal great toe    (Blank rows = not tested)    TODAY'S TREATMENT               02/14/22:Manual decongestive massage for bil LE's including short neck, deep and superficial abdominal, spinal nodes, inguinal and axillary node activation.  Bilateral inguinal-axillary anastomosis supine anterior, sidelying for posterior for bilateral LE's. Pt was bandaged using multilayer short stretch bandaging and 1/2" foam.  Therapist modified to have as many bandages as are typically used using three six cm for foot and ankle and leg  and 2- 10 cm for LE; one - 10 cm and 3 -10 cm  for thigh area.              02/09/22:              6/30:                           Manual decongestive massage for bil LE's including short neck, deep and superficial abdominal, spinal nodes, inguinal and axillary node activation.  Bilateral inguinal-axillary anastomosis supine  anterior, sidelying for posterior for bilateral LE's. PT cut foam for B thighs.  Pt was bandaged using multilayer short stretch bandaging and 1/2" foam.  Therapist modified to have as many bandages as are typically used using two six cm for foot and ankle one 8 cm  and 2- 10 cm for LE; one - 10 cm and 3 -10 cm  for thigh area.              02/09/22:              Manual decongestive massage for bil LE's including short neck, deep and superficial abdominal, spinal nodes, inguinal and axillary node activation.  Bilateral inguinal-axillary     anastomosis supine anterior, sidelying for posterior for bilateral LE's. Short stretch bandaging using one six and two 8 cm short stretch bandages as well as 1/2 inch foam.  These were taken from departmental stock and  we will replace with pt's bandages and continue with full bandaging next treatment when pt brings in her bandages.   02/07/22: Manual decongestive massage for bil LE's including short neck, deep and superficial abdominal, spinal nodes, inguinal and axillary node activation.  Bilateral inguinal-axillary anastomosis supine anterior, sidelying for posterior for bilateral LE's. Education: as above to both patient and CG regarding lymphedema.  Noted area on Lt anterior LE that pt states has been there a while that needs to be looked at by dermatologist.  02/04/22: Reviewed goals, educated importance of HEP compliance for maximal benefits, educated 4 aspects of lymph therapy Therex: LAQ, ankle pumps, adduction, abduction, marching, squeeze 10x each Manual lymphedema decongestive with LE elevated on wedge in supine included: short neck, superficial and deep abdominal, inguinal to axillary anastomoses, sidelying for posterior. 6/21: Explained to pt what lymphedema is, that it can not be cured but can be controlled and the four aspects of treatment.   PATIENT EDUCATION:  Education details: Reviewed goals, educated 4 components of lymph care: skin, exercise,  manual and compression, reviewed HEP Eval: HEP Person educated: Patient Education method: Explanation, Verbal cues, and Handouts Education comprehension: returned demonstration and needs further education   HOME EXERCISE PROGRAM: Ankle pumps, LAQ, hip ab/adduction, marching, diaphragm breath, lymphatic squeeze.  ASSESSMENT:  CLINICAL IMPRESSION: Pt bandages stayed on, although, pt was uncomfortable.  Pt is not walking as much as she should be therefore therapist urged pt to walk more.  Pt will continue to benefit from skilled PT for total decongestive techniques to decrease edema and improve mobility. OBJECTIVE IMPAIRMENTS Abnormal gait, decreased activity tolerance, difficulty walking, increased edema, and decreased skin integrity. .   ACTIVITY LIMITATIONS carrying, lifting, dressing, and locomotion level  PARTICIPATION LIMITATIONS: shopping and community activity  PERSONAL FACTORS Age, Fitness, and Time since onset of injury/illness/exacerbation are also affecting patient's functional outcome.   REHAB POTENTIAL: Good  CLINICAL DECISION MAKING: Evolving/moderate complexity  EVALUATION COMPLEXITY: Moderate  GOALS: Goals reviewed with patient? No  SHORT TERM GOALS: Target date: 03/07/2022    PT to be completing HEP at least daily to assist in increasing lymphatic circulation  Baseline: Goal status: MET  2.  Pt to be completing self manual techniques at home to assist in lymphatic circulation  Baseline:  Goal status: IN PROGRESS  3.  PT to have lost 2-3 cm in thigh and LE measurements, (not foot) Baseline:  Goal status: IN PROGRESS    LONG TERM GOALS: Target date: 03/16/22   PT to have received and be using a compression pump daily when short stretch bandaging is not donned. Baseline:  Goal status: IN PROGRESS  2.  PT to have lost between 3-4 cm in thigh and LE measurements to reduce risk of cellulitis. Baseline:  Goal status: IN PROGRESS  3.  Pt to be able to  state the maintenance phase of lymphedema. Baseline:  Goal status: IN PROGRESS PLAN: PT FREQUENCY: 3x/week  PT DURATION: 6 weeks  PLANNED INTERVENTIONS: Therapeutic exercises, Patient/Family education, Manual lymph drainage, Compression bandaging, and Manual therapy  PLAN FOR NEXT SESSION:  Review self manual and give sheet to assure correct mechanics.  Measure on Wed.   Rayetta Humphrey, [place] CLT [phone]  02/11/2022, 17:15

## 2022-02-15 NOTE — Progress Notes (Unsigned)
Stacey Summers, [place] [address]   CLINIC:  Medical Oncology/Hematology  PCP:  Janora Norlander, DO [place] [phone]   REASON FOR VISIT:  Follow-up for thrombocytopenia  PRIOR THERAPY: None  CURRENT THERAPY: Monthly B12 injections  INTERVAL HISTORY:  [name] 76 y.o. female returns for routine follow-up of her thrombocytopenia, B12 deficiency, and lung nodules.  She was last seen by Tarri Abernethy PA-C on 08/02/2021.  At today's visit, she reports feeling fair.  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  She denies noticing any bleeding events such as hematemesis, hematochezia, epistaxis, melena, or hematuria. She does have some mild easy bruising on her extremities, but denies any petechial rash.  No B symptoms such as fever, chills, night sweats, unintentional weight loss.  She continues to have chronic fatigue related to her multiple medical comorbidities.  She has been taking iron tablet daily for the past 6 months which she is tolerating well.  She has 40% energy and 100% appetite. She endorses that she is maintaining a stable weight.   REVIEW OF SYSTEMS:    Review of Systems  Constitutional:  Positive for fatigue. Negative for appetite change, chills, diaphoresis, fever and unexpected weight change.  HENT:   Positive for trouble swallowing. Negative for lump/mass and nosebleeds.   Eyes:  Negative for eye problems.  Respiratory:  Positive for shortness of breath (with exertion). Negative for cough and hemoptysis.   Cardiovascular:  Positive for leg swelling (chronic) and palpitations. Negative for chest pain.  Gastrointestinal:  Positive for nausea. Negative for abdominal pain, blood in stool, constipation, diarrhea and vomiting.  Genitourinary:  Negative for  hematuria.   Musculoskeletal:  Positive for arthralgias and back pain.  Skin: Negative.   Neurological:  Positive for dizziness. Negative for headaches and light-headedness.  Hematological:  Does not bruise/bleed easily.     PAST MEDICAL/SURGICAL HISTORY:  Past Medical History:  Diagnosis Date   Allergy    seasonal   Anxiety    Arthritis    hands, back    B12 deficiency 03/31/2021   Cataract    surgery to remove   Chronic back pain    lower back    Cirrhosis of liver without ascites ([facility])    recent dx 07/11/18   Closed fracture of right tibial plateau 01/31/2018   Complex tear of medial meniscus of right knee 03/27/2018   COPD (chronic obstructive pulmonary disease) ([facility])    no inhaler   Diabetes mellitus without complication (HCC)    DJD (degenerative joint disease)    Fibromyalgia    Genetic testing 03/08/2017   Ms. Fobes underwent genetic counseling and testing for hereditary cancer syndromes on 02/21/2017. Her results were negative for mutations in all 83 genes analyzed by [facility]'s 83-gene Multi-Cancers Panel. 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   History of abnormal mammogram    History of arthroplasty of right knee 07/06/2018   Hyperlipidemia    Hypertension    Irregular heart beat    Neuromuscular disorder (HCC)    fibromyalgia   Sleep apnea    cpap some  Stress fracture of right tibia 03/27/2018   SVD (spontaneous vaginal delivery)    x 2   Tubular adenoma of colon 05/09/12   "innumerable number" of flat polyps   Past Surgical History:  Procedure Laterality Date   CARPAL TUNNEL RELEASE Left 10/20/14   [facility]   CATARACT EXTRACTION, BILATERAL     CESAREAN SECTION  1977   CHOLECYSTECTOMY  1990   COLONOSCOPY     IR  RADIOLOGIST EVAL & MGMT  07/16/2019   KNEE ARTHROSCOPY WITH SUBCHONDROPLASTY Right 03/27/2018   Procedure: Right knee arthroscopic partial medial meniscus repair versus menisectomy with medial tibia subchondroplasty;  Surgeon: Nicholes Stairs, MD;  Location: [facility];  Service: Orthopedics;  Laterality: Right;  120 mins   LARYNX SURGERY     POLYP REMOVAL   MULTIPLE TOOTH EXTRACTIONS     POLYPECTOMY     vocal cord   TONSILLECTOMY  1965   TUBAL LIGATION     TUMOR REMOVAL     benign fatty tumors on abdomen   UPPER GASTROINTESTINAL ENDOSCOPY       SOCIAL HISTORY:  Social History   Socioeconomic History   Marital status: Married    Spouse name: [name] "goes by [employer]"   Number of children: 3   Years of education: 11   Highest education level: GED or equivalent  Occupational History   Occupation: retired  Tobacco Use   Smoking status: Former    Packs/day: 1.00    Years: 3.00    Total pack years: 3.00    Types: Cigarettes    Quit date: 08/15/1969    Years since quitting: 52.5   Smokeless tobacco: Never  Vaping Use   Vaping Use: Never used  Substance and Sexual Activity   Alcohol use: No    Alcohol/week: 0.0 standard drinks of alcohol   Drug use: No   Sexual activity: Not Currently  Other Topics Concern   Not on file  Social History Narrative   Daughter lives with them and helps a lot   Social Determinants of Health   Financial Resource Strain: Low Risk  (02/10/2022)   Overall Financial Resource Strain (CARDIA)    Difficulty of Paying Living Expenses: Not hard at all  Food Insecurity: No Food Insecurity (02/10/2022)   Hunger Vital Sign    Worried About Running Out of Food in the Last Year: Never true    [place] in the Last Year: Never true  Transportation Needs: No Transportation Needs (02/10/2022)   PRAPARE - Hydrologist (Medical): No    Lack of Transportation (Non-Medical): No  Physical Activity: Inactive (02/10/2022)   Exercise  Vital Sign    Days of Exercise per Week: 0 days    Minutes of Exercise per Session: 0 min  Stress: No Stress Concern Present (02/10/2022)   [place]    Feeling of Stress : Only a little  Social Connections: Socially Integrated (02/10/2022)   Social Connection and Isolation Panel [NHANES]    Frequency of Communication with Friends and Family: More than three times a week    Frequency of Social Gatherings with Friends and Family: More than three times a week    Attends Religious Services: More than 4 times per year    Active Member of [employer] or Organizations: Yes    Attends Music therapist: More than 4 times per year    Marital Status: Married  Intimate  Partner Violence: Not At Risk (02/10/2022)   Humiliation, Afraid, Rape, and Kick questionnaire    Fear of Current or Ex-Partner: No    Emotionally Abused: No    Physically Abused: No    Sexually Abused: No    FAMILY HISTORY:  Family History  Problem Relation Age of Onset   Heart failure Mother        d.37   Heart disease Mother    Prostate cancer Father        d.89 diagnosed in late-60s   Colon cancer Father        diagnosed at 42   Leukemia Maternal Aunt    Heart failure Maternal Uncle        d.50   Stomach cancer Paternal Aunt        questionable   Colon cancer Paternal Uncle        in his 110s   Heart failure Maternal Grandmother        d.45   Kidney cancer Cousin        d.78s maternal first-cousin   Colon polyps Brother        polyposis. Underwent partial colectomy at age 9.   Spina bifida Brother        died at birth   Colon polyps Son 8   Heart attack Son 62   Other Other 80       multiple lipomas   Esophageal cancer Neg Hx    Rectal cancer Neg Hx    Breast cancer Neg Hx     CURRENT MEDICATIONS:  Outpatient Encounter Medications as of 02/16/2022  Medication Sig   albuterol (VENTOLIN HFA) 108 (90 Base) MCG/ACT inhaler Inhale 2  puffs into the lungs every 6 (six) hours as needed for wheezing or shortness of breath.   ALPRAZolam (XANAX) 0.25 MG tablet    atorvastatin (LIPITOR) 20 MG tablet Take 1 tablet (20 mg total) by mouth daily.   benazepril (LOTENSIN) 20 MG tablet Take 1 tablet (20 mg total) by mouth daily. (NEEDS TO BE SEEN BEFORE NEXT REFILL)   Biotin 5000 MCG SUBL Place under the tongue.   blood glucose meter kit and supplies KIT Dispense based on patient and insurance preference. Use up to four times daily as directed. (FOR ICD-9 250.00, 250.01).   Blood Glucose Monitoring Suppl (ONETOUCH VERIO FLEX SYSTEM) w/Device KIT Check Bs up to 4 times daily Dx E11.9,   Cholecalciferol (VITAMIN D3) 5000 units CAPS Take 5,000 Units by mouth daily.   cyclobenzaprine (FLEXERIL) 5 MG tablet TAKE 1 TABLET BY MOUTH THREE TIMES DAILY AS NEEDED FOR MUSCLE SPASM (NEEDS  APPOINTMENT)   diltiazem (CARDIZEM CD) 120 MG 24 hr capsule Take 1 capsule by mouth once daily   ferrous sulfate 325 (65 FE) MG EC tablet Take 1 tablet (325 mg total) by mouth daily.   fluticasone (FLONASE) 50 MCG/ACT nasal spray USE 1 SPRAY(S) IN EACH NOSTRIL IN THE MORNING AND AT BEDTIME   furosemide (LASIX) 40 MG tablet Take 2 tablet ( total 80 mg)in the morning  and 40 mg in the afternoon   glucose blood (ONETOUCH VERIO) test strip CHECK GLUCOSE UP TO 4 TIMES DAILY Dx E11.69   lactulose (CHRONULAC) 10 GM/15ML solution TAKE 15 ML BY MOUTH  THREE TIMES DAILY AS NEEDED   LORazepam (ATIVAN) 0.5 MG tablet Take 0.5-1 tablets (0.25-0.5 mg total) by mouth 2 (two) times daily as needed for anxiety.   meclizine (ANTIVERT) 25 MG tablet Take 1 tablet (25 mg  total) by mouth 3 (three) times daily as needed for dizziness.   metFORMIN (GLUCOPHAGE) 500 MG tablet Take 1 tablet (500 mg total) by mouth daily with breakfast.   omeprazole (PRILOSEC) 40 MG capsule    ondansetron (ZOFRAN-ODT) 4 MG disintegrating tablet Take 1 tablet (4 mg total) by mouth every 6 (six) hours as needed  for nausea or vomiting.   ONETOUCH DELICA LANCETS 78L MISC USE ONE LANCET TO CHECK GLUCOSE ONCE DAILY AND AS NEEDED   spironolactone (ALDACTONE) 25 MG tablet Take 1 tablet (25 mg total) by mouth daily.   traMADol (ULTRAM) 50 MG tablet Take 1 tablet (50 mg total) by mouth every 12 (twelve) hours as needed for moderate pain.   valACYclovir (VALTREX) 1000 MG tablet valacyclovir 1 gram tablet   No facility-administered encounter medications on file as of 02/16/2022.    ALLERGIES:  Allergies  Allergen Reactions   Latex     "my hands feels numb"     PHYSICAL EXAM:    ECOG PERFORMANCE STATUS: 2 - Symptomatic, <50% confined to bed  There were no vitals filed for this visit. There were no vitals filed for this visit. Physical Exam Constitutional:      Appearance: Normal appearance. She is morbidly obese.  HENT:     Head: Normocephalic and atraumatic.     Mouth/Throat:     Mouth: Mucous membranes are moist.  Eyes:     Extraocular Movements: Extraocular movements intact.     Pupils: Pupils are equal, round, and reactive to light.  Cardiovascular:     Rate and Rhythm: Normal rate and regular rhythm.     Pulses: Normal pulses.     Heart sounds: Murmur heard.  Pulmonary:     Effort: Pulmonary effort is normal.     Breath sounds: Normal breath sounds.  Abdominal:     General: Bowel sounds are normal.     Palpations: Abdomen is soft.     Tenderness: There is no abdominal tenderness.  Musculoskeletal:        General: No swelling.     Right lower leg: Edema (lymphedema wraps in place) present.     Left lower leg: Edema (lymphedema wraps in place) present.  Lymphadenopathy:     Cervical: No cervical adenopathy.  Skin:    General: Skin is warm and dry.  Neurological:     General: No focal deficit present.     Mental Status: She is alert and oriented to person, place, and time.  Psychiatric:        Mood and Affect: Mood normal.        Behavior: Behavior normal.      LABORATORY  DATA:  I have reviewed the labs as listed.  CBC    Component Value Date/Time   WBC 6.9 02/09/2022 1235   RBC 4.41 02/09/2022 1235   HGB 14.5 02/09/2022 1235   HGB 14.7 09/13/2021 1135   HCT 44.7 02/09/2022 1235   HCT 43.0 09/13/2021 1135   PLT 70 (L) 02/09/2022 1235   PLT 100 (LL) 09/13/2021 1135   MCV 101.4 (H) 02/09/2022 1235   MCV 98 (H) 09/13/2021 1135   MCH 32.9 02/09/2022 1235   MCHC 32.4 02/09/2022 1235   RDW 14.5 02/09/2022 1235   RDW 12.9 09/13/2021 1135   LYMPHSABS 1.7 02/09/2022 1235   LYMPHSABS 1.3 09/13/2021 1135   MONOABS 0.8 02/09/2022 1235   EOSABS 0.1 02/09/2022 1235   EOSABS 0.1 09/13/2021 1135   BASOSABS 0.0 02/09/2022 1235  BASOSABS 0.1 09/13/2021 1135      Latest Ref Rng & Units 02/10/2022    9:25 AM 02/09/2022   12:35 PM 01/21/2022   10:16 AM  CMP  Glucose 70 - 99 mg/dL 163  211  289   BUN 8 - 27 mg/dL 58  60  37   Creatinine 0.57 - 1.00 mg/dL 2.00  2.10  1.24   Sodium 134 - 144 mmol/L 144  140  137   Potassium 3.5 - 5.2 mmol/L 5.6  5.1  4.8   Chloride 96 - 106 mmol/L 105  102  95   CO2 20 - 29 mmol/L 26  27  29    Calcium 8.7 - 10.3 mg/dL 9.6  9.1  9.4   Total Protein 6.5 - 8.1 g/dL  5.7  5.2   Total Bilirubin 0.3 - 1.2 mg/dL  2.8  2.6   Alkaline Phos 38 - 126 U/L  107  151   AST 15 - 41 U/L  46  56   ALT 0 - 44 U/L  47  43     DIAGNOSTIC IMAGING:  I have independently reviewed the relevant imaging and discussed with the patient.  ASSESSMENT & PLAN: 1.  Thrombocytopenia: - Platelet count is ranging anywhere between 70-140.  Most recent platelets (02/09/2022) at 70. - CT of the AP on 06/27/2018 showed spleen is mildly enlarged.  She has cirrhosis of the liver. - EGD on 08/20/2019 showed grade 1 varices, but patient denies any melena or bright red blood per rectum  - No signs or symptoms of bleeding at this time.  Admits to easy bruising but denies petechial rash.  - Differential diagnosis favors thrombocytopenia secondary to splenomegaly and  cirrhosis. - No treatment indicated unless platelet count is consistently < 30,000 - if this occurs, splenic artery embolization could be considered - PLAN: No treatment of thrombocytopenia needed at this time.  RTC for labs and follow-up visit in 6 months.    2.  Iron deficiency (without anemia) - Iron panel (07/28/2021): Ferritin 35, iron saturation 44% - Patient denies any signs of GI blood loss - Most recent EGD (08/20/2019) showed small esophageal varices and mild portal hypertensive gastropathy - She has been taking iron tablets since December 2022, which she is tolerating well . - Most recent labs (02/09/2022): Hgb 14.5/MCV 101.4, ferritin 83, iron saturation 49%. - PLAN: Continue ferrous sulfate 325 mg daily.  We will recheck iron panel at follow-up visit in 6 months, and consider parenteral IV iron if no improvement.  3.  B12 deficiency - B12 deficiency evidenced by labs (03/26/2021) showing elevated methylmalonic acid 629, although B12 was normal at 336 - Patient has been receiving monthly B12 injections - Most recent labs (02/09/2022): Vitamin B12 885, methylmalonic acid 531 - PLAN: Continue monthly B12 injections  4.  Lung nodules: - She is a former smoker and has not smoked cigarettes since 1971. - CT of the chest from 10/04/2019 which showed resolution of recent COVID-19 pneumonia.  Bilateral pulmonary nodules, less than 4 mm in size have been stable compared to previous CT chest 06/28/2019 -Repeat CT chest (07/28/2021): Bilateral small subpleural nodules are stable and benign, largest nodule in the peripheral left lower lobe measures 3.5 mm, unchanged. - PLAN: No further follow-up recommended per radiology report   PLAN SUMMARY & DISPOSITION:   Monthly B12 injections Labs in 6 months RTC after labs  All questions were answered. The patient knows to call the clinic with any problems,  questions or concerns.  Medical decision making: Moderate    Time spent on visit: I spent 20  minutes counseling the patient face to face. The total time spent in the appointment was 30 minutes and more than 50% was on counseling.   Harriett Rush, PA-C   02/16/22 11:17 AM

## 2022-02-16 ENCOUNTER — Inpatient Hospital Stay (HOSPITAL_COMMUNITY): Payer: PPO | Attending: Hematology | Admitting: Physician Assistant

## 2022-02-16 ENCOUNTER — Telehealth: Payer: Self-pay | Admitting: Family Medicine

## 2022-02-16 ENCOUNTER — Encounter (HOSPITAL_COMMUNITY): Payer: PPO

## 2022-02-16 ENCOUNTER — Ambulatory Visit (HOSPITAL_COMMUNITY): Payer: PPO | Admitting: Physical Therapy

## 2022-02-16 VITALS — BP 130/63 | HR 69 | Temp 98.0°F | Ht 62.0 in | Wt 255.4 lb

## 2022-02-16 DIAGNOSIS — K219 Gastro-esophageal reflux disease without esophagitis: Secondary | ICD-10-CM | POA: Insufficient documentation

## 2022-02-16 DIAGNOSIS — Z79899 Other long term (current) drug therapy: Secondary | ICD-10-CM | POA: Insufficient documentation

## 2022-02-16 DIAGNOSIS — I890 Lymphedema, not elsewhere classified: Secondary | ICD-10-CM | POA: Diagnosis not present

## 2022-02-16 DIAGNOSIS — E785 Hyperlipidemia, unspecified: Secondary | ICD-10-CM | POA: Insufficient documentation

## 2022-02-16 DIAGNOSIS — E119 Type 2 diabetes mellitus without complications: Secondary | ICD-10-CM | POA: Insufficient documentation

## 2022-02-16 DIAGNOSIS — I10 Essential (primary) hypertension: Secondary | ICD-10-CM | POA: Insufficient documentation

## 2022-02-16 DIAGNOSIS — M797 Fibromyalgia: Secondary | ICD-10-CM | POA: Insufficient documentation

## 2022-02-16 DIAGNOSIS — D696 Thrombocytopenia, unspecified: Secondary | ICD-10-CM

## 2022-02-16 DIAGNOSIS — Z87891 Personal history of nicotine dependence: Secondary | ICD-10-CM | POA: Insufficient documentation

## 2022-02-16 DIAGNOSIS — E538 Deficiency of other specified B group vitamins: Secondary | ICD-10-CM

## 2022-02-16 DIAGNOSIS — R5383 Other fatigue: Secondary | ICD-10-CM | POA: Insufficient documentation

## 2022-02-16 DIAGNOSIS — J449 Chronic obstructive pulmonary disease, unspecified: Secondary | ICD-10-CM | POA: Insufficient documentation

## 2022-02-16 DIAGNOSIS — Z800 Family history of malignant neoplasm of digestive organs: Secondary | ICD-10-CM | POA: Insufficient documentation

## 2022-02-16 DIAGNOSIS — Z86010 Personal history of colonic polyps: Secondary | ICD-10-CM | POA: Insufficient documentation

## 2022-02-16 DIAGNOSIS — E611 Iron deficiency: Secondary | ICD-10-CM

## 2022-02-16 DIAGNOSIS — Z8051 Family history of malignant neoplasm of kidney: Secondary | ICD-10-CM | POA: Insufficient documentation

## 2022-02-16 DIAGNOSIS — R918 Other nonspecific abnormal finding of lung field: Secondary | ICD-10-CM | POA: Insufficient documentation

## 2022-02-16 DIAGNOSIS — R911 Solitary pulmonary nodule: Secondary | ICD-10-CM | POA: Diagnosis not present

## 2022-02-16 DIAGNOSIS — R29898 Other symptoms and signs involving the musculoskeletal system: Secondary | ICD-10-CM

## 2022-02-16 DIAGNOSIS — R161 Splenomegaly, not elsewhere classified: Secondary | ICD-10-CM | POA: Insufficient documentation

## 2022-02-16 DIAGNOSIS — M129 Arthropathy, unspecified: Secondary | ICD-10-CM | POA: Insufficient documentation

## 2022-02-16 DIAGNOSIS — K7460 Unspecified cirrhosis of liver: Secondary | ICD-10-CM | POA: Insufficient documentation

## 2022-02-16 NOTE — Patient Instructions (Signed)
[place] at [facility] Discharge Instructions  You were seen today by Tarri Abernethy PA-C for your low platelets.  As we discussed, your low platelets are due to your underlying liver disease.  They remain low, but stable at their baseline.  You do not need any treatment for low platelets at this time.  You should continue to receive monthly B12 injections.  Your iron levels are improved, so I recommend that you continue taking once daily over-the-counter iron supplementation (ferrous sulfate 325 mg).  This should be taken at a DIFFERENT time of day than your omeprazole (Prilosec).  You can also take your iron with a half a glass of orange juice, which will help your body to absorb it better.  Make sure that your iron is not causing any constipation, and that you are still having about 3 bowel movements per day with your lactulose.  LABS: Return in 6 months for repeat labs  FOLLOW-UP APPOINTMENT: Office visit in 6 months, after labs   Thank you for choosing [place] at [facility] to provide your oncology and hematology care.  To afford each patient quality time with our provider, please arrive at least 15 minutes before your scheduled appointment time.   If you have a lab appointment with the [facility] please come in thru the Main Entrance and check in at the main information desk.  You need to re-schedule your appointment should you arrive 10 or more minutes late.  We strive to give you quality time with our providers, and arriving late affects you and other patients whose appointments are after yours.  Also, if you no show three or more times for appointments you may be dismissed from the clinic at the providers discretion.     Again, thank you for choosing [facility].  Our hope is that these requests will decrease the amount of time that you wait before being seen by our physicians.        _____________________________________________________________  Should you have questions after your visit to [facility], please contact our office at [phone] and follow the prompts.  Our office hours are 8:00 a.m. and 4:30 p.m. Monday - Friday.  Please note that voicemails left after 4:00 p.m. may not be returned until the following business day.  We are closed weekends and major holidays.  You do have access to a nurse 24-7, just call the main number to the clinic [phone] and do not press any options, hold on the line and a nurse will answer the phone.    For prescription refill requests, have your pharmacy contact our office and allow 72 hours.    Due to Covid, you will need to wear a mask upon entering the hospital. If you do not have a mask, a mask will be given to you at the Main Entrance upon arrival. For doctor visits, patients may have 1 support person age 57 or older with them. For treatment visits, patients can not have anyone with them due to social distancing guidelines and our immunocompromised population.

## 2022-02-16 NOTE — Therapy (Signed)
OUTPATIENT PHYSICAL THERAPY  Lymphedema Treatment  Patient Name: Stacey Summers MRN: [id number] DOB:06/01/1946, 75 y.o., female Today's Date: 02/16/2022   PT End of Session - 02/16/22 1218     Visit Number 7    Number of Visits 18    Date for PT Re-Evaluation 03/16/22    Authorization Type [employer].    Progress Note Due on Visit 10    PT Start Time 1307    PT Stop Time 1430    PT Time Calculation (min) 83 min    Activity Tolerance Patient tolerated treatment well    Behavior During Therapy WFL for tasks assessed/performed              Past Medical History:  Diagnosis Date   Allergy    seasonal   Anxiety    Arthritis    hands, back    B12 deficiency 03/31/2021   Cataract    surgery to remove   Chronic back pain    lower back    Cirrhosis of liver without ascites (HCC)    recent dx 07/11/18   Closed fracture of right tibial plateau 01/31/2018   Complex tear of medial meniscus of right knee 03/27/2018   COPD (chronic obstructive pulmonary disease) (HCC)    no inhaler   Diabetes mellitus without complication (HCC)    DJD (degenerative joint disease)    Fibromyalgia    Genetic testing 03/08/2017   Ms. Landers underwent genetic counseling and testing for hereditary cancer syndromes on 02/21/2017. Her results were negative for mutations in all 83 genes analyzed by [facility]'s 83-gene Multi-Cancers Panel. 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   History of abnormal mammogram    History of arthroplasty of right knee 07/06/2018   Hyperlipidemia    Hypertension    Irregular heart beat    Neuromuscular disorder (HCC)    fibromyalgia   Sleep apnea    cpap some   Stress fracture of right tibia 03/27/2018   SVD (spontaneous  vaginal delivery)    x 2   Tubular adenoma of colon 05/09/12   "innumerable number" of flat polyps   Past Surgical History:  Procedure Laterality Date   CARPAL TUNNEL RELEASE Left 10/20/14   [facility]   CATARACT EXTRACTION, BILATERAL     CESAREAN SECTION  1977   CHOLECYSTECTOMY  1990   COLONOSCOPY     IR RADIOLOGIST EVAL & MGMT  07/16/2019   KNEE ARTHROSCOPY WITH SUBCHONDROPLASTY Right 03/27/2018   Procedure: Right knee arthroscopic partial medial meniscus repair versus menisectomy with medial tibia subchondroplasty;  Surgeon: Rogers, Jason Patrick, MD;  Location: [facility] OR;  Service: Orthopedics;  Laterality: Right;  120 mins   LARYNX SURGERY     POLYP REMOVAL   MULTIPLE TOOTH EXTRACTIONS     POLYPECTOMY     vocal cord   TONSILLECTOMY  1965   TUBAL LIGATION     TUMOR REMOVAL     benign fatty tumors on abdomen   UPPER GASTROINTESTINAL ENDOSCOPY     Patient Active Problem List   Diagnosis Date Noted   B12 deficiency 03/31/2021   Left shoulder pain 08/12/2020   Left knee pain 08/12/2020   Encephalopathy, hepatic (HCC) 08/01/2019   Thrombocytopenia (HCC) 07/05/2019   Hyperlipidemia associated   with type 2 diabetes mellitus (HCC) 04/02/2019   Liver cirrhosis secondary to NASH (HCC) 07/11/2018   Degeneration of lumbar intervertebral disc 12/28/2017   Lumbar radiculopathy 12/28/2017   Spondylolisthesis, grade 1 12/28/2017   Chronic pain of right knee 12/19/2017   Genetic testing 03/08/2017   Restrictive lung disease secondary to obesity 08/04/2015   Physical deconditioning 08/04/2015   OSA treated with BiPAP 08/04/2015   Gastroesophageal reflux disease without esophagitis 12/25/2014   Low serum vitamin D 05/28/2014   Osteopenia 05/28/2014   COPD (chronic obstructive pulmonary disease) (HCC) 01/29/2014   Dyspnea on exertion 01/07/2014   Fatigue 02/25/2013   Murmur, cardiac 02/25/2013   Diabetes (HCC) 02/25/2013   Morbid obesity (HCC) 02/25/2013   Family history of sudden death  02/25/2013   Hypertension associated with diabetes (HCC) 12/20/2012   GAD (generalized anxiety disorder) 12/20/2012   Fibromyalgia muscle pain 12/20/2012   SUBJECTIVE                                                                                                                                                                                            PCP:Ashley Gottschalk  REFERRING PROVIDER: Ashley Gottschalk  REFERRING DIAG: K75.81,K74.60 (ICD-10-CM) - Liver cirrhosis secondary to NASH (HCC) R60.0 (ICD-10-CM) - Bilateral lower extremity edema   THERAPY DIAG:  R60.0 (ICD-10-CM) - Bilateral lower extremity edema    Rationale for Evaluation and Treatment Rehabilitation  ONSET DATE: chronic   SUBJECTIVE:   SUBJECTIVE STATEMENT:  Pt states that the thigh's bandages are irritating.   Happy with the reduction.   PERTINENT HISTORY: Morbid obesity, obstructive sleep apnea, essential hypertension, hyperlipidemia and chronic leg edema.  PT has low back pain, fibromyalgia.     PAIN:  Are you having pain? Yes back pt has chronic back pain we are not treating or addressing this. Pt states she needs surgery but MD will not complete due to her medical history.   PRECAUTIONS: Other: cellulitis  WEIGHT BEARING RESTRICTIONS No  FALLS:  Has patient fallen in last 6 months? No  LIVING ENVIRONMENT: Lives with: lives with their family Lives in: House/apartment Stairs: No Has following equipment at home: Single point cane and Walker - 2 wheeled  OCCUPATION: retired   PLOF: Independent with basic ADLs  PATIENT GOALS to move better    OBJECTIVE  COGNITION:  Overall cognitive status: Within functional limits for tasks assessed   PALPATION: Noted induration B    LYMPHEDEMA ASSESSMENTS:       LE LANDMARK RIGHT Eval 6/21 Rt  6/28 RT  7/5  At groin     30 cm proximal to suprapatella       20 cm proximal to suprapatella  77 79 76  10 cm proximal to suprapatella  65.5 68.3  66.5  At midpatella / popliteal crease  54.8 53 53.7  30 cm proximal to floor at lateral plantar foot  54.4 51 52  20 cm proximal to floor at lateral plantar foot  41.1 40.3 39.7  10 cm proximal to floor at lateral plantar foot  29.8 29.3 29.2  Circumference of ankle/heel  36.7 35.4 33.5  5 cm proximal to 1st MTP joint  25.8 25 23.9  Across MTP joint  26.2 25.6 24.5  Around proximal great toe      (Blank rows = not tested)  LE LANDMARK LEFT Eval 6/21 Lt  6/28 Lt  7/5  At groin     30 cm proximal to suprapatella     20 cm proximal to suprapatella  75 73 71.5  10 cm proximal to suprapatella  67 66 69  At midpatella / popliteal crease  57.5 57 56  30 cm proximal to floor at lateral plantar foot  52 55 53.5  20 cm proximal to floor at lateral plantar foot  40.7 45.5 41  10 cm proximal to floor at lateral plantar foot  31.4 30.8 30.8  Circumference of ankle/heel  36 35.8 33.5  5 cm proximal to 1st MTP joint  25.8 26 23.6  Across MTP joint 25.2 25.4 23.8  Around proximal great toe     (Blank rows = not tested)    TODAY'S TREATMENT                02/16/22: Manual decongestive massage for bil LE's including short neck, deep and superficial abdominal, spinal nodes, inguinal and axillary node activation.  Bilateral inguinal-axillary anastomosis supine anterior, sidelying for posterior for bilateral LE's. Pt was bandaged using multilayer short stretch bandaging and 1/2" foam.  Therapist modified to have as many bandages as are typically used using three six cm for foot and ankle and leg  and 2- 10 cm for LE; one - 10 cm and 3 -10 cm  for thigh area. Measurement              02/14/22:Manual decongestive massage for bil LE's including short neck, deep and superficial abdominal, spinal nodes, inguinal and axillary node activation.  Bilateral inguinal-axillary anastomosis supine anterior, sidelying for posterior for bilateral LE's. Pt was bandaged using multilayer short stretch bandaging and 1/2"  foam.  Therapist modified to have as many bandages as are typically used using three six cm for foot and ankle and leg  and 2- 10 cm for LE; one - 10 cm and 3 -10 cm  for thigh area.                         6/30:                           Manual decongestive massage for bil LE's including short neck, deep and superficial abdominal, spinal nodes, inguinal and axillary node activation.  Bilateral inguinal-axillary anastomosis supine anterior, sidelying for posterior for bilateral LE's. PT cut foam for B thighs.  Pt was bandaged using multilayer short stretch bandaging and 1/2" foam.  Therapist modified to have as many bandages as are typically used using two six cm for foot and ankle one 8 cm  and 2- 10 cm for LE; one - 10 cm and 3 -10 cm  for   thigh area.              02/09/22:              Manual decongestive massage for bil LE's including short neck, deep and superficial abdominal, spinal nodes, inguinal and axillary node activation.  Bilateral inguinal-axillary     anastomosis supine anterior, sidelying for posterior for bilateral LE's. Short stretch bandaging using one six and two 8 cm short stretch bandages as well as 1/2 inch foam.  These were taken from departmental stock and we will replace with pt's bandages and continue with full bandaging next treatment when pt brings in her bandages.   02/07/22: Manual decongestive massage for bil LE's including short neck, deep and superficial abdominal, spinal nodes, inguinal and axillary node activation.  Bilateral inguinal-axillary anastomosis supine anterior, sidelying for posterior for bilateral LE's. Education: as above to both patient and CG regarding lymphedema.  Noted area on Lt anterior LE that pt states has been there a while that needs to be looked at by dermatologist.  02/04/22: Reviewed goals, educated importance of HEP compliance for maximal benefits, educated 4 aspects of lymph therapy Therex: LAQ, ankle pumps, adduction, abduction, marching,  squeeze 10x each Manual lymphedema decongestive with LE elevated on wedge in supine included: short neck, superficial and deep abdominal, inguinal to axillary anastomoses, sidelying for posterior. 6/21: Explained to pt what lymphedema is, that it can not be cured but can be controlled and the four aspects of treatment.   PATIENT EDUCATION:  Education details: Reviewed goals, educated 4 components of lymph care: skin, exercise, manual and compression, reviewed HEP Eval: HEP Person educated: Patient Education method: Explanation, Verbal cues, and Handouts Education comprehension: returned demonstration and needs further education   HOME EXERCISE PROGRAM: Ankle pumps, LAQ, hip ab/adduction, marching, diaphragm breath, lymphatic squeeze.  ASSESSMENT:  CLINICAL IMPRESSION: PT measured with general minimal  reduction.  Reviewed self manual 1-4 for pt to be completing at home.  Pt bandaged to thigh therefore she can not complete 6-15.  Pt can not do step #5 due to having B Lymphedema.  Pt has increased induration most likely due to high temp and humidity that we area experiencing.  Pt still has not heard anything about the compression pump.  Pt bandages stayed on.  Pt will continue to benefit from skilled PT for total decongestive techniques to decrease edema and improve mobility. OBJECTIVE IMPAIRMENTS Abnormal gait, decreased activity tolerance, difficulty walking, increased edema, and decreased skin integrity. .   ACTIVITY LIMITATIONS carrying, lifting, dressing, and locomotion level  PARTICIPATION LIMITATIONS: shopping and community activity  PERSONAL FACTORS Age, Fitness, and Time since onset of injury/illness/exacerbation are also affecting patient's functional outcome.   REHAB POTENTIAL: Good  CLINICAL DECISION MAKING: Evolving/moderate complexity  EVALUATION COMPLEXITY: Moderate  GOALS: Goals reviewed with patient? No  SHORT TERM GOALS: Target date: 03/09/2022    PT to be  completing HEP at least daily to assist in increasing lymphatic circulation  Baseline: Goal status: MET  2.  Pt to be completing self manual techniques at home to assist in lymphatic circulation  Baseline:  Goal status: IN PROGRESS  3.  PT to have lost 2-3 cm in thigh and LE measurements, (not foot) Baseline:  Goal status: IN PROGRESS    LONG TERM GOALS: Target date: 03/16/22   PT to have received and be using a compression pump daily when short stretch bandaging is not donned. Baseline:  Goal status: IN PROGRESS  2.  PT to have  lost between 3-4 cm in thigh and LE measurements to reduce risk of cellulitis. Baseline:  Goal status: IN PROGRESS  3.  Pt to be able to state the maintenance phase of lymphedema. Baseline:  Goal status: IN PROGRESS PLAN: PT FREQUENCY: 3x/week  PT DURATION: 6 weeks  PLANNED INTERVENTIONS: Therapeutic exercises, Patient/Family education, Manual lymph drainage, Compression bandaging, and Manual therapy  PLAN FOR NEXT SESSION:  Measure on Wed.   Cynthia Russell, PT CLT [phone]  02/11/2022, 17:15 

## 2022-02-18 ENCOUNTER — Telehealth (HOSPITAL_COMMUNITY): Payer: Self-pay

## 2022-02-18 ENCOUNTER — Encounter (HOSPITAL_COMMUNITY): Payer: PPO

## 2022-02-18 ENCOUNTER — Encounter (HOSPITAL_COMMUNITY): Payer: Self-pay

## 2022-02-18 ENCOUNTER — Ambulatory Visit (HOSPITAL_COMMUNITY): Payer: PPO

## 2022-02-18 DIAGNOSIS — R29898 Other symptoms and signs involving the musculoskeletal system: Secondary | ICD-10-CM

## 2022-02-18 DIAGNOSIS — I890 Lymphedema, not elsewhere classified: Secondary | ICD-10-CM | POA: Diagnosis not present

## 2022-02-18 NOTE — Telephone Encounter (Signed)
Returned call answering questions about pump and compression garments.  Ihor Austin, LPTA/CLT; Delana Meyer [phone]

## 2022-02-18 NOTE — Therapy (Signed)
OUTPATIENT PHYSICAL THERAPY  Lymphedema Treatment  Patient Name: Stacey Summers MRN: [id number] DOB:[birth year], 76 y.o., female Today's Date: 02/18/2022   PT End of Session - 02/18/22 1759     Visit Number 8    Number of Visits 18    Date for PT Re-Evaluation 03/16/22    Authorization Type [employer].    Progress Note Due on Visit 10    PT Start Time 1303    PT Stop Time 1425    PT Time Calculation (min) 82 min    Activity Tolerance Patient tolerated treatment well    Behavior During Therapy WFL for tasks assessed/performed               Past Medical History:  Diagnosis Date   Allergy    seasonal   Anxiety    Arthritis    hands, back    B12 deficiency 03/31/2021   Cataract    surgery to remove   Chronic back pain    lower back    Cirrhosis of liver without ascites ([place])    recent dx 07/11/18   Closed fracture of right tibial plateau 01/31/2018   Complex tear of medial meniscus of right knee 03/27/2018   COPD (chronic obstructive pulmonary disease) (HCC)    no inhaler   Diabetes mellitus without complication (HCC)    DJD (degenerative joint disease)    Fibromyalgia    Genetic testing 03/08/2017   Stacey Summers underwent genetic counseling and testing for hereditary cancer syndromes on 02/21/2017. Her results were negative for mutations in all 83 genes analyzed by [facility]'s 83-gene Multi-Cancers Panel. 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   History of abnormal mammogram    History of arthroplasty of right knee 07/06/2018   Hyperlipidemia    Hypertension    Irregular heart beat    Neuromuscular disorder (HCC)    fibromyalgia   Sleep apnea    cpap some   Stress fracture of right tibia 03/27/2018   SVD  (spontaneous vaginal delivery)    x 2   Tubular adenoma of colon 05/09/12   "innumerable number" of flat polyps   Past Surgical History:  Procedure Laterality Date   CARPAL TUNNEL RELEASE Left 10/20/14   [facility]   CATARACT EXTRACTION, BILATERAL     CESAREAN SECTION  1977   CHOLECYSTECTOMY  1990   COLONOSCOPY     IR RADIOLOGIST EVAL & MGMT  07/16/2019   KNEE ARTHROSCOPY WITH SUBCHONDROPLASTY Right 03/27/2018   Procedure: Right knee arthroscopic partial medial meniscus repair versus menisectomy with medial tibia subchondroplasty;  Surgeon: Nicholes Stairs, MD;  Location: [facility];  Service: Orthopedics;  Laterality: Right;  120 mins   LARYNX SURGERY     POLYP REMOVAL   MULTIPLE TOOTH EXTRACTIONS     POLYPECTOMY     vocal cord   TONSILLECTOMY  1965   TUBAL LIGATION     TUMOR REMOVAL     benign fatty tumors on abdomen   UPPER GASTROINTESTINAL ENDOSCOPY     Patient Active Problem List   Diagnosis Date Noted   B12 deficiency 03/31/2021   Left shoulder pain 08/12/2020   Left knee pain 08/12/2020   Encephalopathy, hepatic ([place]) 08/01/2019   Thrombocytopenia ([place]) 07/05/2019   Hyperlipidemia  associated with type 2 diabetes mellitus ([place]) 04/02/2019   Liver cirrhosis secondary to NASH ([place]) 07/11/2018   Degeneration of lumbar intervertebral disc 12/28/2017   Lumbar radiculopathy 12/28/2017   Spondylolisthesis, grade 1 12/28/2017   Chronic pain of right knee 12/19/2017   Genetic testing 03/08/2017   Restrictive lung disease secondary to obesity 08/04/2015   Physical deconditioning 08/04/2015   OSA treated with BiPAP 08/04/2015   Gastroesophageal reflux disease without esophagitis 12/25/2014   Low serum vitamin D 05/28/2014   Osteopenia 05/28/2014   COPD (chronic obstructive pulmonary disease) ([place]) 01/29/2014   Dyspnea on exertion 01/07/2014   Fatigue 02-26-2013   Murmur, cardiac February 26, 2013   Diabetes ([place]) 02-26-2013   Morbid obesity ([place]) 2013-02-26   Family history of  sudden death 02/26/13   Hypertension associated with diabetes ([place]) 12/20/2012   GAD (generalized anxiety disorder) 12/20/2012   Fibromyalgia muscle pain 12/20/2012   SUBJECTIVE                                                                                                                                                                                            WGN:FAOZHY Stacey Summers  REFERRING PROVIDER: Adam Phenix  REFERRING DIAG: [phone] (ICD-10-CM) - Liver cirrhosis secondary to NASH ([place]) R60.0 (ICD-10-CM) - Bilateral lower extremity edema   THERAPY DIAG:  R60.0 (ICD-10-CM) - Bilateral lower extremity edema    Rationale for Evaluation and Treatment Rehabilitation  ONSET DATE: chronic   SUBJECTIVE:   SUBJECTIVE STATEMENT:  Pt stated she has occasional LBP with movements, pain scale 7/10.  Stated she can see reduction in her legs.   PERTINENT HISTORY: Morbid obesity, obstructive sleep apnea, essential hypertension, hyperlipidemia and chronic leg edema.  PT has low back pain, fibromyalgia.     PAIN:  Are you having pain? Yes back pt has chronic back pain we are not treating or addressing this. Pt states she needs surgery but MD will not complete due to her medical history.   PRECAUTIONS: Other: cellulitis  WEIGHT BEARING RESTRICTIONS No  FALLS:  Has patient fallen in last 6 months? No  LIVING ENVIRONMENT: Lives with: lives with their family Lives in: House/apartment Stairs: No Has following equipment at home: Single point cane and Environmental consultant - 2 wheeled  OCCUPATION: retired   PLOF: Independent with basic ADLs  PATIENT GOALS to move better    OBJECTIVE  COGNITION:  Overall cognitive status: Within functional limits for tasks assessed   PALPATION: Noted induration B    LYMPHEDEMA ASSESSMENTS:       LE LANDMARK RIGHT Eval 6/21 Rt  6/28 RT  7/5  At groin  30 cm proximal to suprapatella     20 cm proximal to suprapatella  77 79 76   10 cm proximal to suprapatella  65.5 68.3 66.5  At midpatella / popliteal crease  54.8 53 53.7  30 cm proximal to floor at lateral plantar foot  54.4 51 52  20 cm proximal to floor at lateral plantar foot  41.1 40.3 39.7  10 cm proximal to floor at lateral plantar foot  29.8 29.3 29.2  Circumference of ankle/heel  36.7 35.4 33.5  5 cm proximal to 1st MTP joint  25.8 25 23.9  Across MTP joint  26.2 25.6 24.5  Around proximal great toe      (Blank rows = not tested)  LE [facility] LEFT Eval 6/21 Lt  6/28 Lt  7/5  At groin     30 cm proximal to suprapatella     20 cm proximal to suprapatella  75 73 71.5  10 cm proximal to suprapatella  67 66 69  At midpatella / popliteal crease  57.5 57 56  30 cm proximal to floor at lateral plantar foot  52 55 53.5  20 cm proximal to floor at lateral plantar foot  40.7 45.5 41  10 cm proximal to floor at lateral plantar foot  31.4 30.8 30.8  Circumference of ankle/heel  36 35.8 33.5  5 cm proximal to 1st MTP joint  25.8 26 23.6  Across MTP joint 25.2 25.4 23.8  Around proximal great toe     (Blank rows = not tested)    TODAY'S TREATMENT  02/18/22:  Manual decongestive massage for bil LE's including short neck, deep and superficial abdominal, spinal nodes, inguinal and axillary node activation.  Bilateral inguinal-axillary anastomosis supine anterior, sidelying for posterior for bilateral LE's. Pt was bandaged using multilayer short stretch bandaging and 1/2" foam.  Therapist modified to have as many bandages as are typically used using three six cm for foot and ankle and leg  and 2- 10 cm for LE; one - 10 cm and 3 -10 cm  for thigh area. Contact Brandon concerning pump, stated he needs notes. Sent this session.              02/16/22: Manual decongestive massage for bil LE's including short neck, deep and superficial abdominal, spinal nodes, inguinal and axillary node activation.  Bilateral inguinal-axillary anastomosis supine anterior, sidelying for  posterior for bilateral LE's. Pt was bandaged using multilayer short stretch bandaging and 1/2" foam.  Therapist modified to have as many bandages as are typically used using three six cm for foot and ankle and leg  and 2- 10 cm for LE; one - 10 cm and 3 -10 cm  for thigh area. Measurement              02/14/22:Manual decongestive massage for bil LE's including short neck, deep and superficial abdominal, spinal nodes, inguinal and axillary node activation.  Bilateral inguinal-axillary anastomosis supine anterior, sidelying for posterior for bilateral LE's. Pt was bandaged using multilayer short stretch bandaging and 1/2" foam.  Therapist modified to have as many bandages as are typically used using three six cm for foot and ankle and leg  and 2- 10 cm for LE; one - 10 cm and 3 -10 cm  for thigh area.                         6/30:  Manual decongestive massage for bil LE's including short neck, deep and superficial abdominal, spinal nodes, inguinal and axillary node activation.  Bilateral inguinal-axillary anastomosis supine anterior, sidelying for posterior for bilateral LE's. PT cut foam for B thighs.  Pt was bandaged using multilayer short stretch bandaging and 1/2" foam.  Therapist modified to have as many bandages as are typically used using two six cm for foot and ankle one 8 cm  and 2- 10 cm for LE; one - 10 cm and 3 -10 cm  for thigh area.              02/09/22:              Manual decongestive massage for bil LE's including short neck, deep and superficial abdominal, spinal nodes, inguinal and axillary node activation.  Bilateral inguinal-axillary     anastomosis supine anterior, sidelying for posterior for bilateral LE's. Short stretch bandaging using one six and two 8 cm short stretch bandages as well as 1/2 inch foam.  These were taken from departmental stock and we will replace with pt's bandages and continue with full bandaging next treatment when pt brings in her bandages.    02/07/22: Manual decongestive massage for bil LE's including short neck, deep and superficial abdominal, spinal nodes, inguinal and axillary node activation.  Bilateral inguinal-axillary anastomosis supine anterior, sidelying for posterior for bilateral LE's. Education: as above to both patient and CG regarding lymphedema.  Noted area on Lt anterior LE that pt states has been there a while that needs to be looked at by dermatologist.  02/04/22: Reviewed goals, educated importance of HEP compliance for maximal benefits, educated 4 aspects of lymph therapy Therex: LAQ, ankle pumps, adduction, abduction, marching, squeeze 10x each Manual lymphedema decongestive with LE elevated on wedge in supine included: short neck, superficial and deep abdominal, inguinal to axillary anastomoses, sidelying for posterior. 6/21: Explained to pt what lymphedema is, that it can not be cured but can be controlled and the four aspects of treatment.   PATIENT EDUCATION:  Education details: Reviewed goals, educated 4 components of lymph care: skin, exercise, manual and compression, reviewed HEP Eval: HEP Person educated: Patient Education method: Explanation, Verbal cues, and Handouts Education comprehension: returned demonstration and needs further education   HOME EXERCISE PROGRAM: Ankle pumps, LAQ, hip ab/adduction, marching, diaphragm breath, lymphatic squeeze.  ASSESSMENT:  CLINICAL IMPRESSION:  Manual decongestive techniques complete anterior and posterior in sidelying.  Pt stated she has began self manual at home.  Therapist modified short stretch bandages as has multiple 6in but no 12in bandages for thighs.  Pt stated she still has not heard anything concerning pump.  Contact Brandon at [employer] who shared missing information that was faxed today.  Daughter called later on asking questions about the pump and compression garments, questions answered.    OBJECTIVE IMPAIRMENTS Abnormal gait, decreased  activity tolerance, difficulty walking, increased edema, and decreased skin integrity. .   ACTIVITY LIMITATIONS carrying, lifting, dressing, and locomotion level  PARTICIPATION LIMITATIONS: shopping and community activity  PERSONAL FACTORS Age, Fitness, and Time since onset of injury/illness/exacerbation are also affecting patient's functional outcome.   REHAB POTENTIAL: Good  CLINICAL DECISION MAKING: Evolving/moderate complexity  EVALUATION COMPLEXITY: Moderate  GOALS: Goals reviewed with patient? No  SHORT TERM GOALS: Target date: 03/11/2022    PT to be completing HEP at least daily to assist in increasing lymphatic circulation  Baseline: Goal status: MET  2.  Pt to be completing self manual techniques at home  to assist in lymphatic circulation  Baseline:  Goal status: IN PROGRESS  3.  PT to have lost 2-3 cm in thigh and LE measurements, (not foot) Baseline:  Goal status: IN PROGRESS    LONG TERM GOALS: Target date: 03/16/22   PT to have received and be using a compression pump daily when short stretch bandaging is not donned. Baseline:  Goal status: IN PROGRESS  2.  PT to have lost between 3-4 cm in thigh and LE measurements to reduce risk of cellulitis. Baseline:  Goal status: IN PROGRESS  3.  Pt to be able to state the maintenance phase of lymphedema. Baseline:  Goal status: IN PROGRESS PLAN: PT FREQUENCY: 3x/week  PT DURATION: 6 weeks  PLANNED INTERVENTIONS: Therapeutic exercises, Patient/Family education, Manual lymph drainage, Compression bandaging, and Manual therapy  PLAN FOR NEXT SESSION:  Measure on Wed.   Ihor Austin, LPTA/CLT; Delana Meyer [phone]

## 2022-02-21 ENCOUNTER — Encounter (HOSPITAL_COMMUNITY): Payer: PPO | Admitting: Physical Therapy

## 2022-02-21 ENCOUNTER — Ambulatory Visit (HOSPITAL_COMMUNITY): Payer: PPO | Admitting: Physical Therapy

## 2022-02-21 DIAGNOSIS — I890 Lymphedema, not elsewhere classified: Secondary | ICD-10-CM

## 2022-02-21 DIAGNOSIS — R29898 Other symptoms and signs involving the musculoskeletal system: Secondary | ICD-10-CM

## 2022-02-21 NOTE — Therapy (Signed)
OUTPATIENT PHYSICAL THERAPY  Lymphedema Treatment  Patient Name: Stacey Summers MRN: [id number] DOB:09-29-1945, 76 y.o., female Today's Date: 02/21/2022   PT End of Session - 02/21/22 1508     Visit Number 9    Number of Visits 18    Date for PT Re-Evaluation 03/16/22    Authorization Type [employer].    Progress Note Due on Visit 10    PT Start Time 1306    PT Stop Time 1431    PT Time Calculation (min) 85 min    Activity Tolerance Patient tolerated treatment well    Behavior During Therapy WFL for tasks assessed/performed               Past Medical History:  Diagnosis Date   Allergy    seasonal   Anxiety    Arthritis    hands, back    B12 deficiency 03/31/2021   Cataract    surgery to remove   Chronic back pain    lower back    Cirrhosis of liver without ascites ([place])    recent dx 07/11/18   Closed fracture of right tibial plateau 01/31/2018   Complex tear of medial meniscus of right knee 03/27/2018   COPD (chronic obstructive pulmonary disease) (HCC)    no inhaler   Diabetes mellitus without complication (HCC)    DJD (degenerative joint disease)    Fibromyalgia    Genetic testing 03/08/2017   [name] underwent genetic counseling and testing for hereditary cancer syndromes on 02/21/2017. Her results were negative for mutations in all 83 genes analyzed by [facility]'s 83-gene Multi-Cancers Panel. 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   History of abnormal mammogram    History of arthroplasty of right knee 07/06/2018   Hyperlipidemia    Hypertension    Irregular heart beat    Neuromuscular disorder (HCC)    fibromyalgia   Sleep apnea    cpap some   Stress fracture of right tibia 03/27/2018   SVD  (spontaneous vaginal delivery)    x 2   Tubular adenoma of colon 05/09/12   "innumerable number" of flat polyps   Past Surgical History:  Procedure Laterality Date   CARPAL TUNNEL RELEASE Left 10/20/14   [facility]   CATARACT EXTRACTION, BILATERAL     CESAREAN SECTION  1977   CHOLECYSTECTOMY  1990   COLONOSCOPY     IR RADIOLOGIST EVAL & MGMT  07/16/2019   KNEE ARTHROSCOPY WITH SUBCHONDROPLASTY Right 03/27/2018   Procedure: Right knee arthroscopic partial medial meniscus repair versus menisectomy with medial tibia subchondroplasty;  Surgeon: Nicholes Stairs, MD;  Location: [place];  Service: Orthopedics;  Laterality: Right;  120 mins   LARYNX SURGERY     POLYP REMOVAL   MULTIPLE TOOTH EXTRACTIONS     POLYPECTOMY     vocal cord   TONSILLECTOMY  1965   TUBAL LIGATION     TUMOR REMOVAL     benign fatty tumors on abdomen   UPPER GASTROINTESTINAL ENDOSCOPY     Patient Active Problem List   Diagnosis Date Noted   B12 deficiency 03/31/2021   Left shoulder pain 08/12/2020   Left knee pain 08/12/2020   Encephalopathy, hepatic ([place]) 08/01/2019   Thrombocytopenia ([place]) 07/05/2019   Hyperlipidemia  associated with type 2 diabetes mellitus ([place]) 04/02/2019   Liver cirrhosis secondary to NASH ([place]) 07/11/2018   Degeneration of lumbar intervertebral disc 12/28/2017   Lumbar radiculopathy 12/28/2017   Spondylolisthesis, grade 1 12/28/2017   Chronic pain of right knee 12/19/2017   Genetic testing 03/08/2017   Restrictive lung disease secondary to obesity 08/04/2015   Physical deconditioning 08/04/2015   OSA treated with BiPAP 08/04/2015   Gastroesophageal reflux disease without esophagitis 12/25/2014   Low serum vitamin D 05/28/2014   Osteopenia 05/28/2014   COPD (chronic obstructive pulmonary disease) ([place]) 01/29/2014   Dyspnea on exertion 01/07/2014   Fatigue 03-27-13   Murmur, cardiac 03/27/13   Diabetes ([place]) 03-27-13   Morbid obesity ([place]) 2013-03-27   Family history of  sudden death 03/27/2013   Hypertension associated with diabetes ([place]) 12/20/2012   GAD (generalized anxiety disorder) 12/20/2012   Fibromyalgia muscle pain 12/20/2012   SUBJECTIVE                                                                                                                                                                                            KPT:WSFKCL [name]  REFERRING PROVIDER: Adam Phenix  REFERRING DIAG: [phone] (ICD-10-CM) - Liver cirrhosis secondary to NASH ([place]) R60.0 (ICD-10-CM) - Bilateral lower extremity edema   THERAPY DIAG:  R60.0 (ICD-10-CM) - Bilateral lower extremity edema    Rationale for Evaluation and Treatment Rehabilitation  ONSET DATE: chronic   SUBJECTIVE:   SUBJECTIVE STATEMENT:  Pt stated she is limited by her LBP and fatigues quickly. States she is sleeping in her recliner as she is unable to get in and out of bed at night to urinate.  States she is on fluid pills and normally doesn't get up at night.   PERTINENT HISTORY: Morbid obesity, obstructive sleep apnea, essential hypertension, hyperlipidemia and chronic leg edema.  PT has low back pain, fibromyalgia.     PAIN:  Are you having pain? Yes back pt has chronic back pain we are not treating or addressing this. Pt states she needs surgery but MD will not complete due to her medical history.   PRECAUTIONS: Other: cellulitis  WEIGHT BEARING RESTRICTIONS No  FALLS:  Has patient fallen in last 6 months? No  LIVING ENVIRONMENT: Lives with: lives with their family Lives in: House/apartment Stairs: No Has following equipment at home: Single point cane and Environmental consultant - 2 wheeled  OCCUPATION: retired   PLOF: Independent with basic ADLs  PATIENT GOALS to move better    OBJECTIVE  COGNITION:  Overall cognitive status: Within functional limits for tasks assessed   PALPATION: Noted induration B  LYMPHEDEMA ASSESSMENTS:       LE LANDMARK  RIGHT Eval 6/21 Rt  6/28 RT  7/5  At groin     30 cm proximal to suprapatella     20 cm proximal to suprapatella  77 79 76  10 cm proximal to suprapatella  65.5 68.3 66.5  At midpatella / popliteal crease  54.8 53 53.7  30 cm proximal to floor at lateral plantar foot  54.4 51 52  20 cm proximal to floor at lateral plantar foot  41.1 40.3 39.7  10 cm proximal to floor at lateral plantar foot  29.8 29.3 29.2  Circumference of ankle/heel  36.7 35.4 33.5  5 cm proximal to 1st MTP joint  25.8 25 23.9  Across MTP joint  26.2 25.6 24.5  Around proximal great toe      (Blank rows = not tested)  LE [facility] LEFT Eval 6/21 Lt  6/28 Lt  7/5  At groin     30 cm proximal to suprapatella     20 cm proximal to suprapatella  75 73 71.5  10 cm proximal to suprapatella  67 66 69  At midpatella / popliteal crease  57.5 57 56  30 cm proximal to floor at lateral plantar foot  52 55 53.5  20 cm proximal to floor at lateral plantar foot  40.7 45.5 41  10 cm proximal to floor at lateral plantar foot  31.4 30.8 30.8  Circumference of ankle/heel  36 35.8 33.5  5 cm proximal to 1st MTP joint  25.8 26 23.6  Across MTP joint 25.2 25.4 23.8  Around proximal great toe     (Blank rows = not tested)    TODAY'S TREATMENT   02/21/22:   Manual: Lymphatic decongestive massage for bil LE's including short neck, deep and superficial abdominal, spinal nodes, inguinal and axillary node activation.  Bilateral inguinal-axillary anastomosis supine anterior, sidelying for posterior for bilateral LE's.  Compression: multilayer short stretch bandaging and 1/2" foam.  Therapist modified to have as many bandages as are typically used using 3-6cm and 2-10cm for foot,ankle and distal LE;  For thigh 1- 10 cm and 3-12cm.  Netting used for bandaging #9 and #12  02/18/22:  Manual decongestive massage for bil LE's including short neck, deep and superficial abdominal, spinal nodes, inguinal and axillary node activation.   Bilateral inguinal-axillary anastomosis supine anterior, sidelying for posterior for bilateral LE's. Pt was bandaged using multilayer short stretch bandaging and 1/2" foam.  Therapist modified to have as many bandages as are typically used using three six cm for foot and ankle and leg  and 2- 10 cm for LE; one - 10 cm and 3 -10 cm  for thigh area. Contact Brandon concerning pump, stated he needs notes. Sent this session.             02/16/22: Manual decongestive massage for bil LE's including short neck, deep and superficial abdominal, spinal nodes, inguinal and axillary node activation.  Bilateral inguinal-axillary anastomosis supine anterior, sidelying for posterior for bilateral LE's. Pt was bandaged using multilayer short stretch bandaging and 1/2" foam.  Therapist modified to have as many bandages as are typically used using three six cm for foot and ankle and leg  and 2- 10 cm for LE; one - 10 cm and 3 -10 cm  for thigh area. Measurement  02/14/22:Manual decongestive massage for bil LE's including short neck, deep and superficial abdominal, spinal nodes, inguinal and axillary node activation.  Bilateral inguinal-axillary  anastomosis supine anterior, sidelying for posterior for bilateral LE's. Pt was bandaged using multilayer short stretch bandaging and 1/2" foam.  Therapist modified to have as many bandages as are typically used using three six cm for foot and ankle and leg  and 2- 10 cm for LE; one - 10 cm and 3 -10 cm  for thigh area.    02/11/22:  Manual decongestive massage for bil LE's including short neck, deep and superficial abdominal, spinal nodes, inguinal and axillary node activation.  Bilateral inguinal-axillary anastomosis supine anterior, sidelying for posterior for bilateral LE's. PT cut foam for B thighs.  Pt was bandaged using multilayer short stretch bandaging and 1/2" foam.  Therapist modified to have as many bandages as are typically used using two six cm for foot and ankle one 8 cm  and  2- 10 cm for LE; one - 10 cm and 3 -10 cm  for thigh area.  02/09/22:   Manual decongestive massage for bil LE's including short neck, deep and superficial abdominal, spinal nodes, inguinal and axillary node activation.  Bilateral inguinal-axillary     anastomosis supine anterior, sidelying for posterior for bilateral LE's. Short stretch bandaging using one six and two 8 cm short stretch bandages as well as 1/2 inch foam.  These were taken from departmental stock and we will replace with pt's bandages and continue with full bandaging next treatment when pt brings in her bandages.    02/07/22:  Manual decongestive massage for bil LE's including short neck, deep and superficial abdominal, spinal nodes, inguinal and axillary node activation.  Bilateral inguinal-axillary anastomosis supine anterior, sidelying for posterior for bilateral LE's. Education: as above to both patient and CG regarding lymphedema.  Noted area on Lt anterior LE that pt states has been there a while that needs to be looked at by dermatologist.  02/04/22:  Reviewed goals, educated importance of HEP compliance for maximal benefits, educated 4 aspects of lymph therapy Therex: LAQ, ankle pumps, adduction, abduction, marching, squeeze 10x each Manual lymphedema decongestive with LE elevated on wedge in supine included: short neck, superficial and deep abdominal, inguinal to axillary anastomoses, sidelying for posterior.  01/14/22: Explained to pt what lymphedema is, that it can not be cured but can be controlled and the four aspects of treatment.   PATIENT EDUCATION:  Education details: 02/21/22:  Discuss need for fluid pill with MD as needs to return to bed to sleep Reviewed goals, educated 4 components of lymph care: skin, exercise, manual and compression, reviewed HEP Eval: HEP Person educated: Patient Education method: Explanation, Verbal cues, and Handouts Education comprehension: returned demonstration and needs further  education   HOME EXERCISE PROGRAM: Ankle pumps, LAQ, hip ab/adduction, marching, diaphragm breath, lymphatic squeeze.  ASSESSMENT:  CLINICAL IMPRESSION:  Manual decongestive techniques complete for bilateral LE's this session.  Minimal induration palpated today with general improvement in skin integrity with minimal dryness.  Pt to go to dermatologist soon about place on Lt shin that is tender to touch.  Short stretch bandages applied to bilateral LE's with patient reporting overall comfort.  PT required 2 seated rest breaks when completing each thigh (4 total) due to fatigue and LBP.  Discussed importance of returning to bed and to discuss with MD the need for fluid pill.  Pt will continue to benefit from skilled physical therapy for lymphatic decongestive techniques.    OBJECTIVE IMPAIRMENTS Abnormal gait, decreased activity tolerance, difficulty walking, increased edema, and decreased skin integrity. .   ACTIVITY LIMITATIONS carrying, lifting,  dressing, and locomotion level  PARTICIPATION LIMITATIONS: shopping and community activity  PERSONAL FACTORS Age, Fitness, and Time since onset of injury/illness/exacerbation are also affecting patient's functional outcome.   REHAB POTENTIAL: Good  CLINICAL DECISION MAKING: Evolving/moderate complexity  EVALUATION COMPLEXITY: Moderate  GOALS: Goals reviewed with patient? No  SHORT TERM GOALS: Target date: 03/14/2022    PT to be completing HEP at least daily to assist in increasing lymphatic circulation  Baseline: Goal status: MET  2.  Pt to be completing self manual techniques at home to assist in lymphatic circulation  Baseline:  Goal status: IN PROGRESS  3.  PT to have lost 2-3 cm in thigh and LE measurements, (not foot) Baseline:  Goal status: IN PROGRESS    LONG TERM GOALS: Target date: 03/16/22   PT to have received and be using a compression pump daily when short stretch bandaging is not donned. Baseline:  Goal status: IN  PROGRESS  2.  PT to have lost between 3-4 cm in thigh and LE measurements to reduce risk of cellulitis. Baseline:  Goal status: IN PROGRESS  3.  Pt to be able to state the maintenance phase of lymphedema. Baseline:  Goal status: IN PROGRESS PLAN: PT FREQUENCY: 3x/week  PT DURATION: 6 weeks  PLANNED INTERVENTIONS: Therapeutic exercises, Patient/Family education, Manual lymph drainage, Compression bandaging, and Manual therapy  PLAN FOR NEXT SESSION:  Continue with complete lymphatic therapy.  Measure on Wed.    Teena Irani, PTA/CLT [place] Ph: [phone]

## 2022-02-22 NOTE — Addendum Note (Signed)
Addended by: Leeroy Cha on: 02/22/2022 04:10 PM   Modules accepted: Orders

## 2022-02-23 ENCOUNTER — Ambulatory Visit (HOSPITAL_COMMUNITY): Payer: PPO | Admitting: Physical Therapy

## 2022-02-23 ENCOUNTER — Encounter (HOSPITAL_COMMUNITY): Payer: Self-pay | Admitting: Physical Therapy

## 2022-02-23 ENCOUNTER — Encounter (HOSPITAL_COMMUNITY): Payer: Self-pay | Admitting: Hematology

## 2022-02-23 ENCOUNTER — Encounter (HOSPITAL_COMMUNITY): Payer: PPO | Admitting: Physical Therapy

## 2022-02-23 DIAGNOSIS — I890 Lymphedema, not elsewhere classified: Secondary | ICD-10-CM

## 2022-02-23 NOTE — Therapy (Signed)
OUTPATIENT PHYSICAL THERAPY  Lymphedema Treatment  Patient Name: Stacey Summers MRN: [id number] DOB:16-Dec-1945, 76 y.o., female Today's Date: 02/23/2022 PHYSICAL THERAPY DISCHARGE SUMMARY  Visits from Start of Care: 10  Current functional level related to goals / functional outcomes: See below, pt reduces well with compression bandaging but can not tolerate and takes it off, once off, the edema starts to return immediately.    Remaining deficits: edema   Education / Equipment: Use pump 2x a day and wear compression garments during waking hours.    Patient agrees to discharge. Patient goals were partially met. Patient is being discharged due to the patient's request.   PT End of Session - 02/23/22 1353     Visit Number 10    Number of Visits 10   Date for PT Re-Evaluation 03/16/22    Authorization Type [employer].    Progress Note Due on Visit 10    PT Start Time 1355    PT Stop Time 1503   PT Time Calculation (min) 68 min    Activity Tolerance Patient tolerated treatment well    Behavior During Therapy WFL for tasks assessed/performed               Past Medical History:  Diagnosis Date   Allergy    seasonal   Anxiety    Arthritis    hands, back    B12 deficiency 03/31/2021   Cataract    surgery to remove   Chronic back pain    lower back    Cirrhosis of liver without ascites ([place])    recent dx 07/11/18   Closed fracture of right tibial plateau 01/31/2018   Complex tear of medial meniscus of right knee 03/27/2018   COPD (chronic obstructive pulmonary disease) (HCC)    no inhaler   Diabetes mellitus without complication (HCC)    DJD (degenerative joint disease)    Fibromyalgia    Genetic testing 03/08/2017   Ms. Monceaux underwent genetic counseling and testing for hereditary cancer syndromes on 02/21/2017. Her results were negative for mutations in all 83 genes analyzed by [facility]'s 83-gene Multi-Cancers Panel. Genes analyzed include: ALK, APC,  ATM, AXIN2, BAP1, BARD1, BLM, BMPR1A, BRCA1, BRCA2, BRIP1, CASR, CDC73, CDH1, CDK4, CDKN1B, CDKN1C, CDKN2A, CEBPA, CHEK2, CTNNA1, DICER1, DIS3L2, EGFR, EPCAM   GERD (gastroesophageal reflux disease)    Hearing loss    bilateral - no hearing aids   History of abnormal mammogram    History of arthroplasty of right knee 07/06/2018   Hyperlipidemia    Hypertension    Irregular heart beat    Neuromuscular disorder (HCC)    fibromyalgia   Sleep apnea    cpap some   Stress fracture of right tibia 03/27/2018   SVD (spontaneous vaginal delivery)    x 2   Tubular adenoma of colon 05/09/12   "innumerable number" of flat polyps   Past Surgical History:  Procedure Laterality Date   CARPAL TUNNEL RELEASE Left 10/20/14   [facility]   CATARACT EXTRACTION, BILATERAL     CESAREAN SECTION  1977   CHOLECYSTECTOMY  1990   COLONOSCOPY     IR RADIOLOGIST EVAL & MGMT  07/16/2019   KNEE ARTHROSCOPY WITH SUBCHONDROPLASTY Right 03/27/2018   Procedure: Right knee arthroscopic partial medial meniscus repair versus menisectomy with medial tibia subchondroplasty;  Surgeon: Nicholes Stairs, MD;  Location: [facility];  Service: Orthopedics;  Laterality: Right;  120 mins   LARYNX SURGERY     POLYP REMOVAL  MULTIPLE TOOTH EXTRACTIONS     POLYPECTOMY     vocal cord   TONSILLECTOMY  1965   TUBAL LIGATION     TUMOR REMOVAL     benign fatty tumors on abdomen   UPPER GASTROINTESTINAL ENDOSCOPY     Patient Active Problem List   Diagnosis Date Noted   B12 deficiency 03/31/2021   Left shoulder pain 08/12/2020   Left knee pain 08/12/2020   Encephalopathy, hepatic ([place]) 08/01/2019   Thrombocytopenia ([place]) 07/05/2019   Hyperlipidemia associated with type 2 diabetes mellitus ([place]) 04/02/2019   Liver cirrhosis secondary to NASH ([place]) 07/11/2018   Degeneration of lumbar intervertebral disc 12/28/2017   Lumbar radiculopathy 12/28/2017   Spondylolisthesis, grade 1 12/28/2017   Chronic pain of right knee 12/19/2017    Genetic testing 03/08/2017   Restrictive lung disease secondary to obesity 08/04/2015   Physical deconditioning 08/04/2015   OSA treated with BiPAP 08/04/2015   Gastroesophageal reflux disease without esophagitis 12/25/2014   Low serum vitamin D 05/28/2014   Osteopenia 05/28/2014   COPD (chronic obstructive pulmonary disease) ([place]) 01/29/2014   Dyspnea on exertion 01/07/2014   Fatigue 02-26-13   Murmur, cardiac 2013/02/26   Diabetes ([place]) 02/26/13   Morbid obesity ([place]) 2013/02/26   Family history of sudden death 26-Feb-2013   Hypertension associated with diabetes ([place]) 12/20/2012   GAD (generalized anxiety disorder) 12/20/2012   Fibromyalgia muscle pain 12/20/2012                                                                                                                                                                                           [facility]  REFERRING PROVIDER: Adam Phenix  REFERRING DIAG: [phone] (ICD-10-CM) - Liver cirrhosis secondary to NASH ([place]) R60.0 (ICD-10-CM) - Bilateral lower extremity edema   THERAPY DIAG:  R60.0 (ICD-10-CM) - Bilateral lower extremity edema    Rationale for Evaluation and Treatment Rehabilitation  ONSET DATE: chronic   SUBJECTIVE:   SUBJECTIVE STATEMENT:  PT states that she had to take the bandages off yesterday, she just is not able to tolerate the bandaging.  She has her pump and requests to be done today.    PERTINENT HISTORY: Morbid obesity, obstructive sleep apnea, essential hypertension, hyperlipidemia and chronic leg edema.  PT has low back pain, fibromyalgia.     PAIN:  Are you having pain? Yes back pt has chronic back pain we are not treating or addressing this. Pt states she needs surgery but MD will not complete due to her medical history.   PRECAUTIONS: Other: cellulitis  WEIGHT BEARING RESTRICTIONS No  FALLS:  Has patient fallen in last 6 months?  No  LIVING ENVIRONMENT: Lives with:  lives with their family Lives in: House/apartment Stairs: No Has following equipment at home: Single point cane and Environmental consultant - 2 wheeled  OCCUPATION: retired   PLOF: Independent with basic ADLs  PATIENT GOALS to move better    OBJECTIVE  COGNITION:  Overall cognitive status: Within functional limits for tasks assessed   PALPATION: Noted induration B    LYMPHEDEMA ASSESSMENTS:       LE LANDMARK RIGHT Eval 6/21 Rt  6/28 RT  7/5 RT  7/12  At groin      30 cm proximal to suprapatella      20 cm proximal to suprapatella  77 79 76 77  10 cm proximal to suprapatella  65.5 68.3 66.5 65  At midpatella / popliteal crease  54.8 53 53.7 55  30 cm proximal to floor at lateral plantar foot  54.4 51 52 54  20 cm proximal to floor at lateral plantar foot  41.1 40.3 39.7 41.3  10 cm proximal to floor at lateral plantar foot  29.8 29.3 29.2 30  Circumference of ankle/heel  36.7 35.4 33.5 35.5  5 cm proximal to 1st MTP joint  25.8 25 23.9 25.3  Across MTP joint  26.2 25.6 24.5 26  Around proximal great toe       (Blank rows = not tested)  LE LANDMARK LEFT Eval 6/21 Lt  6/28 Lt  7/5 LT  7/12  At groin      30 cm proximal to suprapatella      20 cm proximal to suprapatella  75 73 71.5 73  10 cm proximal to suprapatella  67 66 69 65  At midpatella / popliteal crease  57.5 57 56 57  30 cm proximal to floor at lateral plantar foot  52 55 53.5 52  20 cm proximal to floor at lateral plantar foot  40.7 45.5 41 40.7  10 cm proximal to floor at lateral plantar foot  31.4 30.8 30.8 30  Circumference of ankle/heel  36 35.8 33.5 35  5 cm proximal to 1st MTP joint  25.8 26 23.6 23.7  Across MTP joint 25.2 25.4 23.8 24.3  Around proximal great toe      (Blank rows = not tested)    TODAY'S TREATMENT  02/23/22:  Manual:  Measurement.  Lymphatic decongestive massage for bil LE's including short neck, deep and superficial abdominal, spinal nodes, inguinal and axillary node  activation.  Bilateral inguinal-axillary anastomosis supine anterior, sidelying for posterior for bilateral LE's.   02/21/22:   Manual: Lymphatic decongestive massage for bil LE's including short neck, deep and superficial abdominal, spinal nodes, inguinal and axillary node activation.  Bilateral inguinal-axillary anastomosis supine anterior, sidelying for posterior for bilateral LE's.  Compression: multilayer short stretch bandaging and 1/2" foam.  Therapist modified to have as many bandages as are typically used using 3-6cm and 2-10cm for foot,ankle and distal LE;  For thigh 1- 10 cm and 3-12cm.  Netting used for bandaging #9 and #12  02/18/22:  Manual decongestive massage for bil LE's including short neck, deep and superficial abdominal, spinal nodes, inguinal and axillary node activation.  Bilateral inguinal-axillary anastomosis supine anterior, sidelying for posterior for bilateral LE's. Pt was bandaged using multilayer short stretch bandaging and 1/2" foam.  Therapist modified to have as many bandages as are typically used using three six cm for foot and ankle and leg  and 2- 10 cm for LE; one - 10 cm and 3 -10  cm  for thigh area. Contact Brandon concerning pump, stated he needs notes. Sent this session.             02/16/22: Manual decongestive massage for bil LE's including short neck, deep and superficial abdominal, spinal nodes, inguinal and axillary node activation.  Bilateral inguinal-axillary anastomosis supine anterior, sidelying for posterior for bilateral LE's. Pt was bandaged using multilayer short stretch bandaging and 1/2" foam.  Therapist modified to have as many bandages as are typically used using three six cm for foot and ankle and leg  and 2- 10 cm for LE; one - 10 cm and 3 -10 cm  for thigh area. Measurement  Education: as above to both patient and CG regarding lymphedema.  Noted area on Lt anterior LE that pt states has been there a while that needs to be looked at by  dermatologist.  02/04/22:  Reviewed goals, educated importance of HEP compliance for maximal benefits, educated 4 aspects of lymph therapy Therex: LAQ, ankle pumps, adduction, abduction, marching, squeeze 10x each Manual lymphedema decongestive with LE elevated on wedge in supine included: short neck, superficial and deep abdominal, inguinal to axillary anastomoses, sidelying for posterior.  01/14/22: Explained to pt what lymphedema is, that it can not be cured but can be controlled and the four aspects of treatment.   PATIENT EDUCATION:  Education details: 02/23/22:  Pt family and pt states that she can not deal with the compression bandaging.  She has received her pump and wants to be done.  Therapist recommended pumping twice a day for an hour at a time.  Ordering surgical wt knee high compression 20-30 mm hg and order the butler to assist in donning as well as ordering compression capri garment.  02/21/22:  Discuss need for fluid pill with MD as needs to return to bed to sleep Reviewed goals, educated 4 components of lymph care: skin, exercise, manual and compression, reviewed HEP Eval: HEP Person educated: Patient Education method: Explanation, Verbal cues, and Handouts Education comprehension: returned demonstration and needs further education   HOME EXERCISE PROGRAM: Ankle pumps, LAQ, hip ab/adduction, marching, diaphragm breath, lymphatic squeeze.  ASSESSMENT:  CLINICAL IMPRESSION:  Pt and family come in stating that they need to be done with treatment today.  Therapist and daughter brain stormed and feel the best exit strategy is to pump for an hour twice a day and to order knee high surgical wt compression with a butler to assist in donning and then order compression capri's.  Daughter had to take the short stretch bandaging off yesterday.  Daughter states that her mother's legs were down significantly compared to today.  Due to pt not tolerating bandaging pt will be discharged to  maintenance phase of treatment.  Therapist and daughter spoke at lengths with daughter verbally understanding as well as therapist giving daughter handouts to assist with maintenance phase.   OBJECTIVE IMPAIRMENTS Abnormal gait, decreased activity tolerance, difficulty walking, increased edema, and decreased skin integrity. .   ACTIVITY LIMITATIONS carrying, lifting, dressing, and locomotion level  PARTICIPATION LIMITATIONS: shopping and community activity  PERSONAL FACTORS Age, Fitness, and Time since onset of injury/illness/exacerbation are also affecting patient's functional outcome.   REHAB POTENTIAL: Good  CLINICAL DECISION MAKING: Evolving/moderate complexity  EVALUATION COMPLEXITY: Moderate  GOALS: Goals reviewed with patient? No  SHORT TERM GOALS: Target date: 03/16/2022    PT to be completing HEP at least daily to assist in increasing lymphatic circulation  Baseline: Goal status: MET  2.  Pt to  be completing self manual techniques at home to assist in lymphatic circulation  Baseline:  Goal status: IN PROGRESS  3.  PT to have lost 2-3 cm in thigh and LE measurements, (not foot) Baseline:  Goal status: NOT MET    LONG TERM GOALS: Target date: 03/16/22   PT to have received and be using a compression pump daily when short stretch bandaging is not donned. Baseline:  Goal status: MET  2.  PT to have lost between 3-4 cm in thigh and LE measurements to reduce risk of cellulitis. Baseline:  Goal status: NOT MET  3.  Pt to be able to state the maintenance phase of lymphedema. Baseline:  Goal status: MET PLAN: PT FREQUENCY: 3x/week  PT DURATION: 6 weeks  PLANNED INTERVENTIONS: Therapeutic exercises, Patient/Family education, Manual lymph drainage, Compression bandaging, and Manual therapy  PLAN FOR NEXT SESSION:  Discharge.   Rayetta Humphrey, PT CLT [place] Ph: [phone]

## 2022-02-24 ENCOUNTER — Encounter (HOSPITAL_COMMUNITY): Payer: Self-pay | Admitting: Hematology

## 2022-02-24 ENCOUNTER — Encounter: Payer: Self-pay | Admitting: Cardiovascular Disease

## 2022-02-24 ENCOUNTER — Ambulatory Visit: Payer: PPO | Admitting: Cardiovascular Disease

## 2022-02-24 VITALS — BP 137/59 | HR 79 | Ht 62.0 in | Wt 260.0 lb

## 2022-02-24 DIAGNOSIS — G4733 Obstructive sleep apnea (adult) (pediatric): Secondary | ICD-10-CM

## 2022-02-24 DIAGNOSIS — Z862 Personal history of diseases of the blood and blood-forming organs and certain disorders involving the immune mechanism: Secondary | ICD-10-CM

## 2022-02-24 DIAGNOSIS — E7800 Pure hypercholesterolemia, unspecified: Secondary | ICD-10-CM

## 2022-02-24 DIAGNOSIS — N179 Acute kidney failure, unspecified: Secondary | ICD-10-CM

## 2022-02-24 DIAGNOSIS — I5032 Chronic diastolic (congestive) heart failure: Secondary | ICD-10-CM

## 2022-02-24 DIAGNOSIS — R002 Palpitations: Secondary | ICD-10-CM | POA: Diagnosis not present

## 2022-02-24 DIAGNOSIS — K7460 Unspecified cirrhosis of liver: Secondary | ICD-10-CM | POA: Diagnosis not present

## 2022-02-24 DIAGNOSIS — I10 Essential (primary) hypertension: Secondary | ICD-10-CM | POA: Diagnosis not present

## 2022-02-24 DIAGNOSIS — K7581 Nonalcoholic steatohepatitis (NASH): Secondary | ICD-10-CM | POA: Diagnosis not present

## 2022-02-24 DIAGNOSIS — I50810 Right heart failure, unspecified: Secondary | ICD-10-CM

## 2022-02-24 MED ORDER — ONDANSETRON 4 MG PO TBDP: 4.0000 mg | ORAL_TABLET | Freq: Four times a day (QID) | ORAL | 0 refills | Status: DC | PRN

## 2022-02-24 NOTE — Patient Instructions (Addendum)
Medication Instructions:  STOP the Spironolactone STOP the Metformin STOP the Ferrous Sulfate  *If you need a refill on your cardiac medications before your next appointment, please call your pharmacy*   Lab Work: Your provider would like for you to return in one month to have the following labs drawn: BMET. You do not need an appointment for the lab. Once in our office lobby there is a podium where you can sign in and ring the doorbell to alert [place] that you are here. The lab is open from 8:00 am to 4 pm; closed for lunch from 12:45pm-1:45pm.  You may also go to any of these [facility] locations:   [facility] ([place]) - [place] [address] [place] [address] - [address] [place] - [address] ([facility]  If you have labs (blood work) drawn today and your tests are completely normal, you will receive your results only by: [employer] (if you have MyChart) OR A paper copy in the mail If you have any lab test that is abnormal or we need to change your treatment, we will call you to review the results.   Testing/Procedures: None ordered   Follow-Up: At [facility], you and your health needs are our priority.  As part of our continuing mission to provide you with exceptional heart care, we have created designated Provider Care Teams.  These Care Teams include your primary Cardiologist (physician) and Advanced Practice Providers (APPs -  Physician Assistants and Nurse Practitioners) who all work together to provide you with the care you need, when you need it.  We recommend signing up for the patient portal called "MyChart".  Sign up information is provided on this After Visit Summary.  MyChart is used to connect with patients for  Virtual Visits (Telemedicine).  Patients are able to view lab/test results, encounter notes, upcoming appointments, etc.  Non-urgent messages can be sent to your provider as well.   To learn more about what you can do with MyChart, go to [email or web address].    Your next appointment:   1 month(s)  The format for your next appointment:   In Person  Provider:   Sanda Klein, MD or an APP   Important Information About Sugar

## 2022-02-24 NOTE — Progress Notes (Signed)
Cardiology office note   Date:  03/02/2022   ID:  Stacey Summers, DOB 1946-04-12, MRN [id number]   PCP:  Janora Norlander, DO  Cardiologist:  Sanda Klein, MD  Electrophysiologist:  None   Evaluation Performed:  Follow-Up Visit  Chief Complaint: Nausea  History of Present Illness:    Stacey Summers is a 76 y.o. female with Morbid obesity, obstructive sleep apnea, essential hypertension, hyperlipidemia and chronic leg edema.  She has cirrhosis due to NASH.    She has a history of palpitations that responded reasonably well to treatment with diltiazem.  In the office today her rhythm was trigeminal, but she was unaware of palpitations.  Her biggest complaint currently is nausea.  She does not have vomiting.  This occurs both pre and postprandially.  She finds it hard to eat and has early satiety.  She denies chest pain or abdominal pain.  She has not had shortness of breath but is very sedentary.  She has mild abdominal distention and moderate lower extremity edema.  We have previously estimated that her dry weight was around 220 pounds and she now weighs 260 pounds.  She is wearing knee-high compression stockings intermittently and they are really helping much.  Lymphedema program helped for a while but she did not like having her legs wrapped since she could not walk and get to the bathroom fast enough.  She is now wearing be compression pumps but these are not as effective.  She underwent an echocardiogram on January 18, 2022 that showed evidence of elevated left heart filling pressures (grade 2 diastolic dysfunction), but normal right heart filling pressures (nondilated inferior vena cava).  We increased her diuretics and added spironolactone.  Unfortunately her creatinine probably increased from normal (around 0.8) to 2.0 and her potassium increased to 5.6.  She has been getting shots of B12 on a monthly basis however her most recent hemoglobin is 14.5.  Continues to have only  spotty compliance with CPAP.  Complains of back pain.   Most recent labs from February 10, 2022 showed hemoglobin 14.5, platelet count 70,000, creatinine 2.0, potassium 5.6, ALT 47; on 01/21/2022 the BNP was 208.  She has normal ferritin and iron levels.  She is concerned about possible "blood clots" although I am not sure why this is specifically her concern.  Her mother died suddenly without clear explanation by postmortem study at age 107.  Her brother, who had multiple health problems diabetes sleep at age 46 not long ago.  She thinks maybe they had clots.   Past Medical History:  Diagnosis Date   Allergy    seasonal   Anxiety    Arthritis    hands, back    B12 deficiency 03/31/2021   Cataract    surgery to remove   Chronic back pain    lower back    Cirrhosis of liver without ascites (Echo)    recent dx 07/11/18   Closed fracture of right tibial plateau 01/31/2018   Complex tear of medial meniscus of right knee 03/27/2018   COPD (chronic obstructive pulmonary disease) ([place])    no inhaler   Diabetes mellitus without complication (HCC)    DJD (degenerative joint disease)    Fibromyalgia    Genetic testing 03/08/2017   Ms. Tomaso underwent genetic counseling and testing for hereditary cancer syndromes on 02/21/2017. Her results were negative for mutations in all 83 genes analyzed by [facility]'s 83-gene Multi-Cancers Panel. Genes analyzed include: ALK, APC, ATM, AXIN2, BAP1,  BARD1, BLM, BMPR1A, BRCA1, BRCA2, BRIP1, CASR, CDC73, CDH1, CDK4, CDKN1B, CDKN1C, CDKN2A, CEBPA, CHEK2, CTNNA1, DICER1, DIS3L2, EGFR, EPCAM   GERD (gastroesophageal reflux disease)    Hearing loss    bilateral - no hearing aids   History of abnormal mammogram    History of arthroplasty of right knee 07/06/2018   Hyperlipidemia    Hypertension    Irregular heart beat    Neuromuscular disorder (HCC)    fibromyalgia   Sleep apnea    cpap some   Stress fracture of right tibia 03/27/2018   SVD (spontaneous vaginal  delivery)    x 2   Tubular adenoma of colon 05/09/12   "innumerable number" of flat polyps   Past Surgical History:  Procedure Laterality Date   CARPAL TUNNEL RELEASE Left 10/20/14   [facility]   CATARACT EXTRACTION, BILATERAL     CESAREAN SECTION  1977   CHOLECYSTECTOMY  1990   COLONOSCOPY     IR RADIOLOGIST EVAL & MGMT  07/16/2019   KNEE ARTHROSCOPY WITH SUBCHONDROPLASTY Right 03/27/2018   Procedure: Right knee arthroscopic partial medial meniscus repair versus menisectomy with medial tibia subchondroplasty;  Surgeon: Nicholes Stairs, MD;  Location: [place];  Service: Orthopedics;  Laterality: Right;  120 mins   LARYNX SURGERY     POLYP REMOVAL   MULTIPLE TOOTH EXTRACTIONS     POLYPECTOMY     vocal cord   TONSILLECTOMY  1965   TUBAL LIGATION     TUMOR REMOVAL     benign fatty tumors on abdomen   UPPER GASTROINTESTINAL ENDOSCOPY       Current Meds  Medication Sig   albuterol (VENTOLIN HFA) 108 (90 Base) MCG/ACT inhaler Inhale 2 puffs into the lungs every 6 (six) hours as needed for wheezing or shortness of breath.   atorvastatin (LIPITOR) 20 MG tablet Take 1 tablet (20 mg total) by mouth daily.   benazepril (LOTENSIN) 20 MG tablet Take 1 tablet (20 mg total) by mouth daily. (NEEDS TO BE SEEN BEFORE NEXT REFILL)   Biotin 5000 MCG SUBL Place under the tongue.   blood glucose meter kit and supplies KIT Dispense based on patient and insurance preference. Use up to four times daily as directed. (FOR ICD-9 250.00, 250.01).   Blood Glucose Monitoring Suppl (ONETOUCH VERIO FLEX SYSTEM) w/Device KIT Check Bs up to 4 times daily Dx E11.9,   Cholecalciferol (VITAMIN D3) 5000 units CAPS Take 5,000 Units by mouth daily.   cyclobenzaprine (FLEXERIL) 5 MG tablet TAKE 1 TABLET BY MOUTH THREE TIMES DAILY AS NEEDED FOR MUSCLE SPASM (NEEDS  APPOINTMENT)   diltiazem (CARDIZEM CD) 120 MG 24 hr capsule Take 1 capsule by mouth once daily   fluticasone (FLONASE) 50 MCG/ACT nasal spray USE 1 SPRAY(S) IN  EACH NOSTRIL IN THE MORNING AND AT BEDTIME   furosemide (LASIX) 40 MG tablet Take 2 tablet ( total 80 mg)in the morning  and 40 mg in the afternoon   glucose blood (ONETOUCH VERIO) test strip CHECK GLUCOSE UP TO 4 TIMES DAILY Dx E11.69   lactulose (CHRONULAC) 10 GM/15ML solution TAKE 15 ML BY MOUTH  THREE TIMES DAILY AS NEEDED   LORazepam (ATIVAN) 0.5 MG tablet Take 0.5-1 tablets (0.25-0.5 mg total) by mouth 2 (two) times daily as needed for anxiety.   meclizine (ANTIVERT) 25 MG tablet Take 1 tablet (25 mg total) by mouth 3 (three) times daily as needed for dizziness.   omeprazole (PRILOSEC) 40 MG capsule    ONETOUCH DELICA LANCETS  33G MISC USE ONE LANCET TO CHECK GLUCOSE ONCE DAILY AND AS NEEDED   traMADol (ULTRAM) 50 MG tablet Take 1 tablet (50 mg total) by mouth every 12 (twelve) hours as needed for moderate pain.   [DISCONTINUED] ferrous sulfate 325 (65 FE) MG EC tablet Take 1 tablet (325 mg total) by mouth daily.   [DISCONTINUED] metFORMIN (GLUCOPHAGE) 500 MG tablet Take 1 tablet (500 mg total) by mouth daily with breakfast.   [DISCONTINUED] ondansetron (ZOFRAN-ODT) 4 MG disintegrating tablet Take 1 tablet (4 mg total) by mouth every 6 (six) hours as needed for nausea or vomiting.   [DISCONTINUED] spironolactone (ALDACTONE) 25 MG tablet Take 1 tablet (25 mg total) by mouth daily.     Allergies:   Latex   Social History   Tobacco Use   Smoking status: Former    Packs/day: 1.00    Years: 3.00    Total pack years: 3.00    Types: Cigarettes    Quit date: 08/15/1969    Years since quitting: 52.5   Smokeless tobacco: Never  Vaping Use   Vaping Use: Never used  Substance Use Topics   Alcohol use: No    Alcohol/week: 0.0 standard drinks of alcohol   Drug use: No     Family Hx: The patient's family history includes Colon cancer in her father and paternal uncle; Colon polyps in her brother; Colon polyps (age of onset: 53) in her son; Heart attack (age of onset: 24) in her son; Heart  disease in her mother; Heart failure in her maternal grandmother, maternal uncle, and mother; Kidney cancer in her cousin; Leukemia in her maternal aunt; Other (age of onset: 63) in an other family member; Prostate cancer in her father; Spina bifida in her brother; Stomach cancer in her paternal aunt. There is no history of Esophageal cancer, Rectal cancer, or Breast cancer.  ROS:   Please see the history of present illness.    All other systems are reviewed and are negative. Prior CV studies:   The following studies were reviewed today:  Echocardiogram 01/18/2022      ECHOCARDIOGRAM REPORT         Patient Name:   Stacey Summers Date of Exam: 01/18/2022  Medical Rec #:  [id number]         Height:       62.0 in  Accession #:    [id number]        Weight:       244.6 lb  Date of Birth:  Dec 16, 1945          BSA:          2.082 m  Patient Age:    45 years          BP:           139/69 mmHg  Patient Gender: F                 HR:           52 bpm.  Exam Location:  [facility]   Procedure: 2D Echo, Cardiac Doppler and Color Doppler   Indications:    Heart Failure with reduced ejection fraction     History:        Patient has prior history of Echocardiogram examinations,  most                  recent 03/04/2013. COPD, Signs/Symptoms:Fatigue, Murmur and  Dyspnea; Risk Factors:Hypertension, Diabetes and  Dyslipidemia.     Sonographer:    Wenda Low  Referring Phys: [phone] [place]      Sonographer Comments: Patient is morbidly obese. Image acquisition  challenging due to COPD.  IMPRESSIONS     1. Left ventricular ejection fraction, by estimation, is 60 to 65%. The  left ventricle has normal function. The left ventricle has no regional  wall motion abnormalities. Left ventricular diastolic parameters are  consistent with Grade II diastolic  dysfunction (pseudonormalization). Elevated left ventricular end-diastolic  pressure.   2. Right ventricular systolic  function is normal. The right ventricular  size is normal. There is normal pulmonary artery systolic pressure.   3. Left atrial size was moderately dilated.   4. The mitral valve is abnormal. Trivial mitral valve regurgitation. No  evidence of mitral stenosis.   5. Mean gradient across valve 13 mmHg calculated AVA 2.5 cm2 . The aortic  valve is tricuspid. There is moderate calcification of the aortic valve.  There is moderate thickening of the aortic valve. Aortic valve  regurgitation is not visualized. Aortic  valve sclerosis/calcification is present, without any evidence of aortic  stenosis.   6. The inferior vena cava is normal in size with greater than 50%  respiratory variability, suggesting right atrial pressure of 3 mmHg.    Labs/Other Tests and Data Reviewed:    EKG: ECG is not ordered today  01/21/2022: BNP 208.1 02/09/2022: ALT 47; Hemoglobin 14.5; Platelets 70 02/10/2022: BUN 58; Creatinine, Ser 2.00; Potassium 5.6; Sodium 144    Recent Labs:   Recent Lipid Panel Lipid Panel     Component Value Date/Time   CHOL 103 06/28/2021 1106   CHOL 162 12/20/2012 1044   TRIG 55 06/28/2021 1106   TRIG 91 12/25/2014 1020   TRIG 83 12/20/2012 1044   HDL 48 06/28/2021 1106   HDL 42 12/25/2014 1020   HDL 42 12/20/2012 1044   CHOLHDL 2.1 06/28/2021 1106   LDLCALC 42 06/28/2021 1106   LDLCALC 59 01/29/2014 1031   LDLCALC 103 (H) 12/20/2012 1044   LABVLDL 13 06/28/2021 1106    Wt Readings from Last 3 Encounters:  03/01/22 246 lb 9.6 oz (111.9 kg)  02/24/22 260 lb (117.9 kg)  02/16/22 255 lb 6.4 oz (115.8 kg)     Objective:    Vital Signs:  BP (!) 137/59 (BP Location: Left Arm, Patient Position: Sitting, Cuff Size: Large)   Pulse 79   Ht 5' 2"  (1.575 m)   Wt 260 lb (117.9 kg)   SpO2 97%   BMI 47.55 kg/m    General: Alert, oriented x3, no distress, morbidly obese Head: no evidence of trauma, PERRL, EOMI, no exophtalmos or lid lag, no myxedema, no xanthelasma; normal  ears, nose and oropharynx Neck: normal jugular venous pulsations and no hepatojugular reflux; brisk carotid pulses without delay and no carotid bruits Chest: clear to auscultation, no signs of consolidation by percussion or palpation, normal fremitus, symmetrical and full respiratory excursions Cardiovascular: normal position and quality of the apical impulse, regular rhythm, normal first and second heart sounds, no murmurs, rubs or gallops Abdomen: no tenderness , but probably mild distention, no masses by palpation, no abnormal pulsatility or arterial bruits, normal bowel sounds, no hepatosplenomegaly Extremities: no clubbing, cyanosis; she has 3+ symmetrical soft pitting edema both legs; 2+ radial, ulnar and brachial pulses bilaterally; 2+ right femoral, posterior tibial and dorsalis pedis pulses; 2+ left femoral, posterior tibial and dorsalis pedis pulses; no  subclavian or femoral bruits Neurological: grossly nonfocal Psych: Normal mood and affect    ASSESSMENT & PLAN:    Edema: Continues to be a problem.  Unfortunately adding spironolactone has led to worsening renal parameters and mild hypokalemia.  We will have to stop it.  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  Probable right heart failure due to inconsistent use of CPAP for OSA.  Likely contribution of cirrhosis.   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  She is to start a lymphedema program, but I think she needs diuresis as well.   Recheck labs in about 3 weeks.  Wear thigh-high compression stockings.  Keep legs elevated whenever possible. AKI: Stop spironolactone.  Also stop metformin at least temporarily.  Recheck bmet 1 month after stopping spironolactone. Diastolic HF: Normal LV systolic function.  Echocardiogram did show evidence of diastolic dysfunction related increasing left heart pressures. OSA: Strongly  recommended 100% compliance with CPAP whenever she sleeps, progressively stable at night. Palpitations: Diltiazem is probably contributing to the edema.  However, its been very beneficial for her palpitations. HTN: Well-controlled.  We will have to stop her spironolactone.  Recheck her blood pressure in 2 to 3 weeks. Cirrhosis: Presumed to be secondary to NASH.  Has thrombocytopenia suggesting that she does have portal hypertension and splenomegaly.  Her most recent labs do show some parenchymal insufficiency with low albumin level, elevated INR and bilirubin.  No clinical evidence of ascites or other signs of portal hypertension at this time. HLP: LDL in target range.  Chronically low HDL, likely related to severe obesity.   Severe obesity: Likely the major underlying cause for her dyslipidemia, hypertension, sleep apnea and NASH. Anemia: Normal hemoglobin and iron stores.  Stop iron supplements.   Patient Instructions  Medication Instructions:  STOP the Spironolactone STOP the Metformin STOP the Ferrous Sulfate  *If you need a refill on your cardiac medications before your next appointment, please call your pharmacy*   Lab Work: Your provider would like for you to return in one month to have the following labs drawn: BMET. You do not need an appointment for the lab. Once in our office lobby there is a podium where you can sign in and ring the doorbell to alert [place] that you are here. The lab is open from 8:00 am to 4 pm; closed for lunch from 12:45pm-1:45pm.  You may also go to any of these [facility] locations:   [facility] [place] ([place]) - [place] [address] [place] [address] - [address] [place] - [address] ([facility]  If you have  labs (blood work) drawn today and your tests are completely normal, you will receive your results only by: [employer] (if you have MyChart) OR A paper copy in the mail If you have any lab test that is abnormal or we need to change your treatment, we will call you to review the results.   Testing/Procedures: None ordered   Follow-Up: At [facility], you and your health needs are our priority.  As part of our continuing mission to provide you with  exceptional heart care, we have created designated Provider Care Teams.  These Care Teams include your primary Cardiologist (physician) and Advanced Practice Providers (APPs -  Physician Assistants and Nurse Practitioners) who all work together to provide you with the care you need, when you need it.  We recommend signing up for the patient portal called "MyChart".  Sign up information is provided on this After Visit Summary.  MyChart is used to connect with patients for Virtual Visits (Telemedicine).  Patients are able to view lab/test results, encounter notes, upcoming appointments, etc.  Non-urgent messages can be sent to your provider as well.   To learn more about what you can do with MyChart, go to [email or web address].    Your next appointment:   1 month(s)  The format for your next appointment:   In Person  Provider:   Sanda Klein, MD or an APP   Important Information About Sugar         Signed, Sanda Klein, MD  03/02/2022 12:06 AM    [place]

## 2022-02-25 ENCOUNTER — Encounter (HOSPITAL_COMMUNITY): Payer: PPO | Admitting: Physical Therapy

## 2022-02-28 ENCOUNTER — Encounter (HOSPITAL_COMMUNITY): Payer: PPO | Admitting: Physical Therapy

## 2022-03-01 ENCOUNTER — Ambulatory Visit (INDEPENDENT_AMBULATORY_CARE_PROVIDER_SITE_OTHER): Payer: PPO | Admitting: Family Medicine

## 2022-03-01 ENCOUNTER — Encounter: Payer: Self-pay | Admitting: Family Medicine

## 2022-03-01 VITALS — BP 117/42 | HR 57 | Temp 97.6°F | Ht 62.0 in | Wt 246.6 lb

## 2022-03-01 DIAGNOSIS — E1169 Type 2 diabetes mellitus with other specified complication: Secondary | ICD-10-CM | POA: Diagnosis not present

## 2022-03-01 DIAGNOSIS — K7460 Unspecified cirrhosis of liver: Secondary | ICD-10-CM | POA: Diagnosis not present

## 2022-03-01 DIAGNOSIS — N179 Acute kidney failure, unspecified: Secondary | ICD-10-CM

## 2022-03-01 DIAGNOSIS — D692 Other nonthrombocytopenic purpura: Secondary | ICD-10-CM | POA: Diagnosis not present

## 2022-03-01 DIAGNOSIS — R600 Localized edema: Secondary | ICD-10-CM | POA: Diagnosis not present

## 2022-03-01 DIAGNOSIS — N1832 Chronic kidney disease, stage 3b: Secondary | ICD-10-CM | POA: Diagnosis not present

## 2022-03-01 DIAGNOSIS — K7210 Chronic hepatic failure without coma: Secondary | ICD-10-CM | POA: Diagnosis not present

## 2022-03-01 DIAGNOSIS — I110 Hypertensive heart disease with heart failure: Secondary | ICD-10-CM | POA: Diagnosis not present

## 2022-03-01 DIAGNOSIS — K7581 Nonalcoholic steatohepatitis (NASH): Secondary | ICD-10-CM

## 2022-03-01 DIAGNOSIS — Z6841 Body Mass Index (BMI) 40.0 and over, adult: Secondary | ICD-10-CM | POA: Diagnosis not present

## 2022-03-01 DIAGNOSIS — E1122 Type 2 diabetes mellitus with diabetic chronic kidney disease: Secondary | ICD-10-CM | POA: Diagnosis not present

## 2022-03-01 DIAGNOSIS — Z7984 Long term (current) use of oral hypoglycemic drugs: Secondary | ICD-10-CM | POA: Diagnosis not present

## 2022-03-01 DIAGNOSIS — I5020 Unspecified systolic (congestive) heart failure: Secondary | ICD-10-CM | POA: Diagnosis not present

## 2022-03-01 NOTE — Progress Notes (Incomplete)
Cardiology office note   Date:  02/24/2022   ID:  Stacey Summers, DOB Sep 04, 1945, MRN [id number]   PCP:  Janora Norlander, DO  Cardiologist:  Sanda Klein, MD  Electrophysiologist:  None   Evaluation Performed:  Follow-Up Visit  Chief Complaint: Nausea  History of Present Illness:    Stacey Summers is a 76 y.o. female with Morbid obesity, obstructive sleep apnea, essential hypertension, hyperlipidemia and chronic leg edema.  She has cirrhosis due to NASH.    She has a history of palpitations that responded reasonably well to treatment with diltiazem.  In the office today her rhythm was trigeminal, but she was unaware of palpitations.  Her biggest complaint currently is nausea.  She does not have vomiting.  This occurs both pre and postprandially.  She finds it hard to eat and has early satiety.  She denies chest pain or abdominal pain.  She has not had shortness of breath but is very sedentary.  She has mild abdominal distention and moderate lower extremity edema.  We have previously estimated that her dry weight was around 220 pounds and she now weighs 260 pounds.  She is wearing knee-high compression stockings intermittently and they are really helping much.  Lymphedema program helped for a while but she did not like having her legs wrapped since she could not walk and get to the bathroom fast enough.  She is now wearing be compression pumps but these are not as effective.  She underwent an echocardiogram on January 18, 2022 that showed evidence of elevated left heart filling pressures (grade 2 diastolic dysfunction), but normal right heart filling pressures (nondilated inferior vena cava).  We increased her diuretics and added spironolactone.  Unfortunately her creatinine probably increased from normal (around 0.8) to 2.0 and her potassium increased to 5.6.  She has been getting shots of B12 on a monthly basis however her most recent hemoglobin is 14.5.  Continues to have only  spotty compliance with CPAP.  Complains of back pain.   Most recent labs from February 10, 2022 showed hemoglobin 14.5, platelet count 70,000, creatinine 2.0, potassium 5.6, ALT 47; on 01/21/2022 the BNP was 208.  She has normal ferritin and iron levels.  She is concerned about possible "blood clots" although I am not sure why this is specifically her concern.  Her mother died suddenly without clear explanation by postmortem study at age 70.  Her brother, who had multiple health problems diabetes sleep at age 74 not long ago.  She thinks maybe they had clots.   Past Medical History:  Diagnosis Date  . Allergy    seasonal  . Anxiety   . Arthritis    hands, back   . B12 deficiency 03/31/2021  . Cataract    surgery to remove  . Chronic back pain    lower back   . Cirrhosis of liver without ascites ([place])    recent dx 07/11/18  . Closed fracture of right tibial plateau 01/31/2018  . Complex tear of medial meniscus of right knee 03/27/2018  . COPD (chronic obstructive pulmonary disease) (HCC)    no inhaler  . Diabetes mellitus without complication ([place])   . DJD (degenerative joint disease)   . Fibromyalgia   . Genetic testing 03/08/2017   Ms. Kriegel underwent genetic counseling and testing for hereditary cancer syndromes on 02/21/2017. Her results were negative for mutations in all 83 genes analyzed by [facility]'s 83-gene Multi-Cancers Panel. Genes analyzed include: ALK, APC, ATM, AXIN2, BAP1,  BARD1, BLM, BMPR1A, BRCA1, BRCA2, BRIP1, CASR, CDC73, CDH1, CDK4, CDKN1B, CDKN1C, CDKN2A, CEBPA, CHEK2, CTNNA1, DICER1, DIS3L2, EGFR, EPCAM  . GERD (gastroesophageal reflux disease)   . 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 History of abnormal mammogram   . History of arthroplasty of right knee 07/06/2018  . Hyperlipidemia   . Hypertension   . Irregular heart beat   . Neuromuscular disorder (HCC)    fibromyalgia  . Sleep apnea    cpap some  . Stress fracture of right tibia 03/27/2018  .  SVD (spontaneous vaginal delivery)    x 2  . Tubular adenoma of colon 05/09/12   "innumerable number" of flat polyps   Past Surgical History:  Procedure Laterality Date  . CARPAL TUNNEL RELEASE Left 10/20/14   [facility]  . CATARACT EXTRACTION, BILATERAL    . [place]  . CHOLECYSTECTOMY  1990  . COLONOSCOPY    . IR RADIOLOGIST EVAL & MGMT  07/16/2019  . KNEE ARTHROSCOPY WITH SUBCHONDROPLASTY Right 03/27/2018   Procedure: Right knee arthroscopic partial medial meniscus repair versus menisectomy with medial tibia subchondroplasty;  Surgeon: Nicholes Stairs, MD;  Location: [place];  Service: Orthopedics;  Laterality: Right;  120 mins  . LARYNX SURGERY     POLYP REMOVAL  . MULTIPLE TOOTH EXTRACTIONS    . POLYPECTOMY     vocal cord  . TONSILLECTOMY  1965  . TUBAL LIGATION    . TUMOR REMOVAL     benign fatty tumors on abdomen  . UPPER GASTROINTESTINAL ENDOSCOPY       No outpatient medications have been marked as taking for the 02/24/22 encounter (Office Visit) with Sanda Klein, MD.     Allergies:   Latex   Social History   Tobacco Use  . Smoking status: Former    Packs/day: 1.00    Years: 3.00    Total pack years: 3.00    Types: Cigarettes    Quit date: 08/15/1969    Years since quitting: 52.5  . Smokeless tobacco: Never  Vaping Use  . Vaping Use: Never used  Substance Use Topics  . Alcohol use: No    Alcohol/week: 0.0 standard drinks of alcohol  . Drug use: No     Family Hx: The patient's family history includes Colon cancer in her father and paternal uncle; Colon polyps in her brother; Colon polyps (age of onset: 55) in her son; Heart attack (age of onset: 6) in her son; Heart disease in her mother; Heart failure in her maternal grandmother, maternal uncle, and mother; Kidney cancer in her cousin; Leukemia in her maternal aunt; Other (age of onset: 57) in an other family member; Prostate cancer in her father; Spina bifida in her brother; Stomach cancer  in her paternal aunt. There is no history of Esophageal cancer, Rectal cancer, or Breast cancer.  ROS:   Please see the history of present illness.    All other systems are reviewed and are negative. Prior CV studies:   The following studies were reviewed today:  Echocardiogram 01/18/2022      ECHOCARDIOGRAM REPORT         Patient Name:   Stacey Summers Date of Exam: 01/18/2022  Medical Rec #:  [id number]         Height:       62.0 in  Accession #:    [id number]        Weight:  244.6 lb  Date of Birth:  1946/02/10          BSA:          2.082 m  Patient Age:    33 years          BP:           139/69 mmHg  Patient Gender: F                 HR:           52 bpm.  Exam Location:  [facility]   Procedure: 2D Echo, Cardiac Doppler and Color Doppler   Indications:    Heart Failure with reduced ejection fraction     History:        Patient has prior history of Echocardiogram examinations,  most                  recent 03/04/2013. COPD, Signs/Symptoms:Fatigue, Murmur and                  Dyspnea; Risk Factors:Hypertension, Diabetes and  Dyslipidemia.     Sonographer:    Wenda Low  Referring Phys: [phone] [place]      Sonographer Comments: Patient is morbidly obese. Image acquisition  challenging due to COPD.  IMPRESSIONS     1. Left ventricular ejection fraction, by estimation, is 60 to 65%. The  left ventricle has normal function. The left ventricle has no regional  wall motion abnormalities. Left ventricular diastolic parameters are  consistent with Grade II diastolic  dysfunction (pseudonormalization). Elevated left ventricular end-diastolic  pressure.   2. Right ventricular systolic function is normal. The right ventricular  size is normal. There is normal pulmonary artery systolic pressure.   3. Left atrial size was moderately dilated.   4. The mitral valve is abnormal. Trivial mitral valve regurgitation. No  evidence of mitral stenosis.   5. Mean  gradient across valve 13 mmHg calculated AVA 2.5 cm2 . The aortic  valve is tricuspid. There is moderate calcification of the aortic valve.  There is moderate thickening of the aortic valve. Aortic valve  regurgitation is not visualized. Aortic  valve sclerosis/calcification is present, without any evidence of aortic  stenosis.   6. The inferior vena cava is normal in size with greater than 50%  respiratory variability, suggesting right atrial pressure of 3 mmHg.    Labs/Other Tests and Data Reviewed:    EKG: ECG is not ordered today  01/21/2022: BNP 208.1 02/09/2022: ALT 47; Hemoglobin 14.5; Platelets 70 02/10/2022: BUN 58; Creatinine, Ser 2.00; Potassium 5.6; Sodium 144    Recent Labs:   Recent Lipid Panel Lipid Panel     Component Value Date/Time   CHOL 103 06/28/2021 1106   CHOL 162 12/20/2012 1044   TRIG 55 06/28/2021 1106   TRIG 91 12/25/2014 1020   TRIG 83 12/20/2012 1044   HDL 48 06/28/2021 1106   HDL 42 12/25/2014 1020   HDL 42 12/20/2012 1044   CHOLHDL 2.1 06/28/2021 1106   LDLCALC 42 06/28/2021 1106   LDLCALC 59 01/29/2014 1031   LDLCALC 103 (H) 12/20/2012 1044   LABVLDL 13 06/28/2021 1106    Wt Readings from Last 3 Encounters:  02/24/22 260 lb (117.9 kg)  02/16/22 255 lb 6.4 oz (115.8 kg)  02/11/22 252 lb (114.3 kg)     Objective:    Vital Signs:  BP (!) 137/59 (BP Location: Left Arm, Patient Position: Sitting, Cuff Size: Large)  Pulse 79   Ht 5' 2"  (1.575 m)   Wt 260 lb (117.9 kg)   SpO2 97%   BMI 47.55 kg/m    General: Alert, oriented x3, no distress, morbidly obese Head: no evidence of trauma, PERRL, EOMI, no exophtalmos or lid lag, no myxedema, no xanthelasma; normal ears, nose and oropharynx Neck: normal jugular venous pulsations and no hepatojugular reflux; brisk carotid pulses without delay and no carotid bruits Chest: clear to auscultation, no signs of consolidation by percussion or palpation, normal fremitus, symmetrical and full  respiratory excursions Cardiovascular: normal position and quality of the apical impulse, regular rhythm, normal first and second heart sounds, no murmurs, rubs or gallops Abdomen: no tenderness , but probably mild distention, no masses by palpation, no abnormal pulsatility or arterial bruits, normal bowel sounds, no hepatosplenomegaly Extremities: no clubbing, cyanosis; she has 3+ symmetrical soft pitting edema both legs; 2+ radial, ulnar and brachial pulses bilaterally; 2+ right femoral, posterior tibial and dorsalis pedis pulses; 2+ left femoral, posterior tibial and dorsalis pedis pulses; no subclavian or femoral bruits Neurological: grossly nonfocal Psych: Normal mood and affect    ASSESSMENT & PLAN:    Edema: Continues to be a problem.  Unfortunately adding spironolactone has led to worsening renal parameters and mild hypokalemia.  We will have to stop it.  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  Probable right heart failure due to inconsistent use of CPAP for OSA.  Likely contribution of cirrhosis.   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  She is to start a lymphedema program, but I think she needs diuresis as well.   Recheck labs in about 3 weeks.  Wear thigh-high compression stockings.  Keep legs elevated whenever possible. Diastolic HF: Normal LV systolic function.  Echocardiogram did show evidence of diastolic dysfunction related increasing left heart pressures. OSA: Strongly recommended 100% compliance with CPAP whenever she sleeps, progressively stable at night. Palpitations: Diltiazem is probably contributing to the edema.  However, its been very beneficial for her palpitations. HTN: Well-controlled.  We will have to stop her spironolactone.  Recheck her blood pressure in 2 to 3 weeks. Cirrhosis: Presumed to be secondary to NASH.  Has thrombocytopenia suggesting that  she does have portal hypertension and splenomegaly.  Her most recent labs do show some parenchymal insufficiency with low albumin level, elevated INR and bilirubin.  No clinical evidence of ascites or other signs of portal hypertension at this time. HLP: LDL in target range.  Chronically low HDL, likely related to severe obesity.  LDL in target range. OSA: I encouraged her to use the CPAP 100% of the time. Severe obesity: Likely the major underlying cause for her dyslipidemia, hypertension, sleep apnea andNASH.   Patient Instructions  Medication Instructions:  STOP the Spironolactone STOP the Metformin STOP the Ferrous Sulfate  *If you need a refill on your cardiac medications before your next appointment, please call your pharmacy*   Lab Work: Your provider would like for you to return in one month to have the following labs drawn: BMET. You do not need an appointment for the lab. Once in our office lobby there is a podium where you can sign in and ring the doorbell to alert [place] that you are here. The lab is open from 8:00 am to 4 pm; closed for lunch from 12:45pm-1:45pm.  You may also go to any of these [facility] locations:  [place] ([place]) - [place] [address] [place] [address] [place] - [address] ([facility]  If you have labs (blood work) drawn today and your tests are completely normal, you will receive your results only by: [employer] (if you have MyChart) OR A paper copy in the mail If you have any lab test that is abnormal or we need to change your treatment, we will call you to review the results.   Testing/Procedures: None ordered   Follow-Up: At [facility], you and your health needs are  our priority.  As part of our continuing mission to provide you with exceptional heart care, we have created designated Provider Care Teams.  These Care Teams include your primary Cardiologist (physician) and Advanced Practice Providers (APPs -  Physician Assistants and Nurse Practitioners) who all work together to provide you with the care you need, when you need it.  We recommend signing up for the patient portal called "MyChart".  Sign up information is provided on this After Visit Summary.  MyChart is used to connect with patients for Virtual Visits (Telemedicine).  Patients are able to view lab/test results, encounter notes, upcoming appointments, etc.  Non-urgent messages can be sent to your provider as well.   To learn more about what you can do with MyChart, go to [email or web address].    Your next appointment:   1 month(s)  The format for your next appointment:   In Person  Provider:   Sanda Klein, MD or an APP   Important Information About Sugar         Signed, Sanda Klein, MD  02/24/2022 11:56 AM    [place]

## 2022-03-01 NOTE — Progress Notes (Signed)
Subjective: CC: Follow-up renal function PCP: Stacey Norlander, DO Stacey Summers is a 76 y.o. female presenting to clinic today for:  1.  Acute on chronic renal injury/cirrhosis of the liver, CKD 3B Patient is brought to the office by her daughter who notes that she has been doing relatively well with her lymphedema pumps.  She went from 260 pounds on the 13th now down to 246 pounds.  She has been diuresing quite a bit with the Lasix and with this.  Spironolactone was taken off after her last lab check with her specialist.  She is also off of iron and metformin.  She has an appointment with nephrology scheduled for Thursday.  She denies any dizziness, chest pain or shortness of breath.  Her daughter notes that she was more jaundiced recently because she was not utilizing her Xifaxan Chronulac as prescribed due to the leg wrappings that were required for her lymphedema.  It was impairing mobility and she did not want to have urinary and stool accidents on herself.  She is now trying to get caught up on that does note urinary and fecal frequency but is still not up to the therapeutic dose just yet   ROS: Per HPI  Allergies  Allergen Reactions   Latex     "my hands feels numb"   Past Medical History:  Diagnosis Date   Allergy    seasonal   Anxiety    Arthritis    hands, back    B12 deficiency 03/31/2021   Cataract    surgery to remove   Chronic back pain    lower back    Cirrhosis of liver without ascites ([place])    recent dx 07/11/18   Closed fracture of right tibial plateau 01/31/2018   Complex tear of medial meniscus of right knee 03/27/2018   COPD (chronic obstructive pulmonary disease) ([place])    no inhaler   Diabetes mellitus without complication (HCC)    DJD (degenerative joint disease)    Fibromyalgia    Genetic testing 03/08/2017   Ms. Zingg underwent genetic counseling and testing for hereditary cancer syndromes on 02/21/2017. Her results were negative for  mutations in all 83 genes analyzed by [facility]'s 83-gene Multi-Cancers Panel. 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   History of abnormal mammogram    History of arthroplasty of right knee 07/06/2018   Hyperlipidemia    Hypertension    Irregular heart beat    Neuromuscular disorder (HCC)    fibromyalgia   Sleep apnea    cpap some   Stress fracture of right tibia 03/27/2018   SVD (spontaneous vaginal delivery)    x 2   Tubular adenoma of colon 05/09/12   "innumerable number" of flat polyps    Current Outpatient Medications:    albuterol (VENTOLIN HFA) 108 (90 Base) MCG/ACT inhaler, Inhale 2 puffs into the lungs every 6 (six) hours as needed for wheezing or shortness of breath., Disp: 18 g, Rfl: 0   atorvastatin (LIPITOR) 20 MG tablet, Take 1 tablet (20 mg total) by mouth daily., Disp: 90 tablet, Rfl: 3   benazepril (LOTENSIN) 20 MG tablet, Take 1 tablet (20 mg total) by mouth daily. (NEEDS TO BE SEEN BEFORE NEXT REFILL), Disp: 30 tablet, Rfl:  0   Biotin 5000 MCG SUBL, Place under the tongue., Disp: , Rfl:    blood glucose meter kit and supplies KIT, Dispense based on patient and insurance preference. Use up to four times daily as directed. (FOR ICD-9 250.00, 250.01)., Disp: 1 each, Rfl: 10   Blood Glucose Monitoring Suppl (ONETOUCH VERIO FLEX SYSTEM) w/Device KIT, Check Bs up to 4 times daily Dx E11.9,, Disp: 1 kit, Rfl: 0   Cholecalciferol (VITAMIN D3) 5000 units CAPS, Take 5,000 Units by mouth daily., Disp: , Rfl:    cyclobenzaprine (FLEXERIL) 5 MG tablet, TAKE 1 TABLET BY MOUTH THREE TIMES DAILY AS NEEDED FOR MUSCLE SPASM (NEEDS  APPOINTMENT), Disp: 30 tablet, Rfl: 0   diltiazem (CARDIZEM CD) 120 MG 24 hr capsule, Take 1 capsule by mouth once daily,  Disp: 90 capsule, Rfl: 0   fluticasone (FLONASE) 50 MCG/ACT nasal spray, USE 1 SPRAY(S) IN EACH NOSTRIL IN THE MORNING AND AT BEDTIME, Disp: 48 g, Rfl: 0   furosemide (LASIX) 40 MG tablet, Take 2 tablet ( total 80 mg)in the morning  and 40 mg in the afternoon, Disp: 270 tablet, Rfl: 2   glucose blood (ONETOUCH VERIO) test strip, CHECK GLUCOSE UP TO 4 TIMES DAILY Dx E11.69, Disp: 400 each, Rfl: 3   lactulose (CHRONULAC) 10 GM/15ML solution, TAKE 15 ML BY MOUTH  THREE TIMES DAILY AS NEEDED, Disp: 946 mL, Rfl: 0   LORazepam (ATIVAN) 0.5 MG tablet, Take 0.5-1 tablets (0.25-0.5 mg total) by mouth 2 (two) times daily as needed for anxiety., Disp: 30 tablet, Rfl: 1   meclizine (ANTIVERT) 25 MG tablet, Take 1 tablet (25 mg total) by mouth 3 (three) times daily as needed for dizziness., Disp: 30 tablet, Rfl: 0   omeprazole (PRILOSEC) 40 MG capsule, , Disp: , Rfl:    ondansetron (ZOFRAN-ODT) 4 MG disintegrating tablet, Take 1 tablet (4 mg total) by mouth every 6 (six) hours as needed for nausea or vomiting., Disp: 30 tablet, Rfl: 0   ONETOUCH DELICA LANCETS 49Z MISC, USE ONE LANCET TO CHECK GLUCOSE ONCE DAILY AND AS NEEDED, Disp: 100 each, Rfl: 0   traMADol (ULTRAM) 50 MG tablet, Take 1 tablet (50 mg total) by mouth every 12 (twelve) hours as needed for moderate pain., Disp: 30 tablet, Rfl: 2   ALPRAZolam (XANAX) 0.25 MG tablet, , Disp: , Rfl:  Social History   Socioeconomic History   Marital status: Married    Spouse name: [name] "goes by [employer]"   Number of children: 3   Years of education: 11   Highest education level: GED or equivalent  Occupational History   Occupation: retired  Tobacco Use   Smoking status: Former    Packs/day: 1.00    Years: 3.00    Total pack years: 3.00    Types: Cigarettes    Quit date: 08/15/1969    Years since quitting: 52.5   Smokeless tobacco: Never  Vaping Use   Vaping Use: Never used  Substance and Sexual Activity   Alcohol use: No    Alcohol/week: 0.0 standard  drinks of alcohol   Drug use: No   Sexual activity: Not Currently  Other Topics Concern   Not on file  Social History Narrative   Daughter lives with them and helps a lot   Social Determinants of Health   Financial Resource Strain: Low Risk  (02/10/2022)   Overall Financial Resource Strain (CARDIA)    Difficulty of Paying Living Expenses: Not hard at all  Food Insecurity: No  Food Insecurity (02/10/2022)   Hunger Vital Sign    Worried About Running Out of Food in the Last Year: Never true    Ran Out of Food in the Last Year: Never true  Transportation Needs: No Transportation Needs (02/10/2022)   PRAPARE - Hydrologist (Medical): No    Lack of Transportation (Non-Medical): No  Physical Activity: Inactive (02/10/2022)   Exercise Vital Sign    Days of Exercise per Week: 0 days    Minutes of Exercise per Session: 0 min  Stress: No Stress Concern Present (02/10/2022)   [place]    Feeling of Stress : Only a little  Social Connections: Socially Integrated (02/10/2022)   Social Connection and Isolation Panel [NHANES]    Frequency of Communication with Friends and Family: More than three times a week    Frequency of Social Gatherings with Friends and Family: More than three times a week    Attends Religious Services: More than 4 times per year    Active Member of [employer] or Organizations: Yes    Attends Music therapist: More than 4 times per year    Marital Status: Married  Human resources officer Violence: Not At Risk (02/10/2022)   Humiliation, Afraid, Rape, and Kick questionnaire    Fear of Current or Ex-Partner: No    Emotionally Abused: No    Physically Abused: No    Sexually Abused: No   Family History  Problem Relation Age of Onset   Heart failure Mother        d.37   Heart disease Mother    Prostate cancer Father        d.89 diagnosed in late-60s   Colon cancer Father         diagnosed at 59   Leukemia Maternal Aunt    Heart failure Maternal Uncle        d.50   Stomach cancer Paternal Aunt        questionable   Colon cancer Paternal Uncle        in his 52s   Heart failure Maternal Grandmother        d.45   Kidney cancer Cousin        d.78s maternal first-cousin   Colon polyps Brother        polyposis. Underwent partial colectomy at age 9.   Spina bifida Brother        died at birth   Colon polyps Son 36   Heart attack Son 58   Other Other 26       multiple lipomas   Esophageal cancer Neg Hx    Rectal cancer Neg Hx    Breast cancer Neg Hx     Objective: Office vital signs reviewed. BP (!) 117/42 Comment: manual  Pulse (!) 57   Temp 97.6 F (36.4 C)   Ht 5' 2" (1.575 m)   Wt 246 lb 9.6 oz (111.9 kg)   SpO2 95%   BMI 45.10 kg/m   Physical Examination:  General: Awake, alert, chronically ill-appearing female.  No acute distress HEENT: Slight scleral icterus noted Cardio: regular rate and rhythm, S1S2 heard, no murmurs appreciated Pulm: clear to auscultation bilaterally, no wheezes, rhonchi or rales; normal work of breathing on room air Extremities: 1-2+ pitting edema bilaterally.  No erythema, warmth or weeping of the legs.  Ambulating with use of rolling walker  Assessment/ Plan: 76 y.o. female   Acute  renal failure superimposed on stage 3b chronic kidney disease, unspecified acute renal failure type ([place]) - Plan: Renal Function Panel, CANCELED: Renal Function Panel  Liver cirrhosis secondary to NASH ([place])  Bilateral lower extremity edema  Check renal function.  Is CCed to Dr. Theador Hawthorne and Dr. Loletha Grayer.  Keep follow-ups as scheduled.  I discussed with the patient and her daughter as she would likely have quite a bit of laboratory work-up with nephrology this Thursday so to prepare for that.  Continue use the lymphatic pumps as prescribed.  She obviously is having an excellent response to medication and lymphatic pumps at this time.  Her  mobility is impaired and she is requiring a walker right now.  I did offer referral to physical therapy, home health PT but she wants to hold off on this for now.  Physical exam was notable for scleral icterus.  Likely secondary to noncompliance with medication but her daughter is gradually titrating her back up to therapeutic doses of the Chronulac  Orders Placed This Encounter  Procedures   Renal Function Panel   No orders of the defined types were placed in this encounter.    Stacey Norlander, DO [facility] [phone]

## 2022-03-02 ENCOUNTER — Encounter (HOSPITAL_COMMUNITY): Payer: PPO | Admitting: Physical Therapy

## 2022-03-02 LAB — RENAL FUNCTION PANEL
Albumin: 2.5 g/dL — ABNORMAL LOW (ref 3.8–4.8)
BUN/Creatinine Ratio: 25 (ref 12–28)
BUN: 37 mg/dL — ABNORMAL HIGH (ref 8–27)
CO2: 26 mmol/L (ref 20–29)
Calcium: 8.8 mg/dL (ref 8.7–10.3)
Chloride: 103 mmol/L (ref 96–106)
Creatinine, Ser: 1.46 mg/dL — ABNORMAL HIGH (ref 0.57–1.00)
Glucose: 141 mg/dL — ABNORMAL HIGH (ref 70–99)
Phosphorus: 3.3 mg/dL (ref 3.0–4.3)
Potassium: 4.1 mmol/L (ref 3.5–5.2)
Sodium: 142 mmol/L (ref 134–144)
eGFR: 38 mL/min/{1.73_m2} — ABNORMAL LOW (ref >59)

## 2022-03-03 ENCOUNTER — Other Ambulatory Visit: Payer: Self-pay | Admitting: Family Medicine

## 2022-03-03 ENCOUNTER — Other Ambulatory Visit (HOSPITAL_COMMUNITY): Payer: Self-pay | Admitting: Nephrology

## 2022-03-03 ENCOUNTER — Other Ambulatory Visit: Payer: Self-pay | Admitting: Nephrology

## 2022-03-03 DIAGNOSIS — D7589 Other specified diseases of blood and blood-forming organs: Secondary | ICD-10-CM | POA: Diagnosis not present

## 2022-03-03 DIAGNOSIS — I152 Hypertension secondary to endocrine disorders: Secondary | ICD-10-CM

## 2022-03-03 DIAGNOSIS — I358 Other nonrheumatic aortic valve disorders: Secondary | ICD-10-CM | POA: Diagnosis not present

## 2022-03-03 DIAGNOSIS — Z6841 Body Mass Index (BMI) 40.0 and over, adult: Secondary | ICD-10-CM | POA: Diagnosis not present

## 2022-03-03 DIAGNOSIS — I129 Hypertensive chronic kidney disease with stage 1 through stage 4 chronic kidney disease, or unspecified chronic kidney disease: Secondary | ICD-10-CM | POA: Diagnosis not present

## 2022-03-03 DIAGNOSIS — E8809 Other disorders of plasma-protein metabolism, not elsewhere classified: Secondary | ICD-10-CM | POA: Diagnosis not present

## 2022-03-03 DIAGNOSIS — I5032 Chronic diastolic (congestive) heart failure: Secondary | ICD-10-CM | POA: Diagnosis not present

## 2022-03-03 DIAGNOSIS — R600 Localized edema: Secondary | ICD-10-CM

## 2022-03-03 DIAGNOSIS — Z7689 Persons encountering health services in other specified circumstances: Secondary | ICD-10-CM | POA: Diagnosis not present

## 2022-03-03 DIAGNOSIS — D6959 Other secondary thrombocytopenia: Secondary | ICD-10-CM | POA: Diagnosis not present

## 2022-03-03 DIAGNOSIS — K7460 Unspecified cirrhosis of liver: Secondary | ICD-10-CM | POA: Diagnosis not present

## 2022-03-03 DIAGNOSIS — N170 Acute kidney failure with tubular necrosis: Secondary | ICD-10-CM | POA: Diagnosis not present

## 2022-03-03 DIAGNOSIS — K7581 Nonalcoholic steatohepatitis (NASH): Secondary | ICD-10-CM | POA: Diagnosis not present

## 2022-03-03 DIAGNOSIS — R82998 Other abnormal findings in urine: Secondary | ICD-10-CM | POA: Diagnosis not present

## 2022-03-08 ENCOUNTER — Telehealth: Payer: Self-pay | Admitting: *Deleted

## 2022-03-08 NOTE — Telephone Encounter (Signed)
Error

## 2022-03-09 ENCOUNTER — Encounter (HOSPITAL_COMMUNITY): Payer: PPO

## 2022-03-09 ENCOUNTER — Encounter (HOSPITAL_COMMUNITY): Payer: Self-pay

## 2022-03-09 ENCOUNTER — Inpatient Hospital Stay (HOSPITAL_COMMUNITY): Payer: PPO

## 2022-03-09 VITALS — BP 133/63 | HR 64 | Temp 98.2°F | Resp 20

## 2022-03-09 DIAGNOSIS — Z86010 Personal history of colonic polyps: Secondary | ICD-10-CM | POA: Diagnosis not present

## 2022-03-09 DIAGNOSIS — Z87891 Personal history of nicotine dependence: Secondary | ICD-10-CM | POA: Insufficient documentation

## 2022-03-09 DIAGNOSIS — K7460 Unspecified cirrhosis of liver: Secondary | ICD-10-CM | POA: Insufficient documentation

## 2022-03-09 DIAGNOSIS — E119 Type 2 diabetes mellitus without complications: Secondary | ICD-10-CM | POA: Diagnosis not present

## 2022-03-09 DIAGNOSIS — R918 Other nonspecific abnormal finding of lung field: Secondary | ICD-10-CM | POA: Diagnosis not present

## 2022-03-09 DIAGNOSIS — I890 Lymphedema, not elsewhere classified: Secondary | ICD-10-CM | POA: Insufficient documentation

## 2022-03-09 DIAGNOSIS — J449 Chronic obstructive pulmonary disease, unspecified: Secondary | ICD-10-CM | POA: Insufficient documentation

## 2022-03-09 DIAGNOSIS — E538 Deficiency of other specified B group vitamins: Secondary | ICD-10-CM | POA: Diagnosis not present

## 2022-03-09 DIAGNOSIS — D696 Thrombocytopenia, unspecified: Secondary | ICD-10-CM | POA: Diagnosis not present

## 2022-03-09 DIAGNOSIS — I10 Essential (primary) hypertension: Secondary | ICD-10-CM | POA: Insufficient documentation

## 2022-03-09 DIAGNOSIS — K219 Gastro-esophageal reflux disease without esophagitis: Secondary | ICD-10-CM | POA: Insufficient documentation

## 2022-03-09 DIAGNOSIS — Z79899 Other long term (current) drug therapy: Secondary | ICD-10-CM | POA: Diagnosis not present

## 2022-03-09 DIAGNOSIS — E785 Hyperlipidemia, unspecified: Secondary | ICD-10-CM | POA: Insufficient documentation

## 2022-03-09 DIAGNOSIS — Z8051 Family history of malignant neoplasm of kidney: Secondary | ICD-10-CM | POA: Diagnosis not present

## 2022-03-09 DIAGNOSIS — Z800 Family history of malignant neoplasm of digestive organs: Secondary | ICD-10-CM | POA: Diagnosis not present

## 2022-03-09 DIAGNOSIS — M797 Fibromyalgia: Secondary | ICD-10-CM | POA: Insufficient documentation

## 2022-03-09 DIAGNOSIS — R5383 Other fatigue: Secondary | ICD-10-CM | POA: Insufficient documentation

## 2022-03-09 DIAGNOSIS — R161 Splenomegaly, not elsewhere classified: Secondary | ICD-10-CM | POA: Diagnosis not present

## 2022-03-09 DIAGNOSIS — M129 Arthropathy, unspecified: Secondary | ICD-10-CM | POA: Insufficient documentation

## 2022-03-09 MED ORDER — CYANOCOBALAMIN 1000 MCG/ML IJ SOLN
1000.0000 ug | Freq: Once | INTRAMUSCULAR | Status: AC
  Administered 2022-03-09: 1000 ug via INTRAMUSCULAR
  Filled 2022-03-09: qty 1

## 2022-03-09 NOTE — Patient Instructions (Signed)
[facility]  Discharge Instructions: Thank you for choosing [facility] to provide your oncology and hematology care.  If you have a lab appointment with the [facility], please come in thru the Main Entrance and check in at the main information desk.  Wear comfortable clothing and clothing appropriate for easy access to any Portacath or PICC line.   We strive to give you quality time with your provider. You may need to reschedule your appointment if you arrive late (15 or more minutes).  Arriving late affects you and other patients whose appointments are after yours.  Also, if you miss three or more appointments without notifying the office, you may be dismissed from the clinic at the provider's discretion.      For prescription refill requests, have your pharmacy contact our office and allow 72 hours for refills to be completed.    Today you received the following chemotherapy and/or immunotherapy agents vitamin b12.  Vitamin B12 Injection What is this medication? Vitamin B12 (VAHY tuh min B12) prevents and treats low vitamin B12 levels in your body. It is used in people who do not get enough vitamin B12 from their diet or when their digestive tract does not absorb enough. Vitamin B12 plays an important role in maintaining the health of your nervous system and red blood cells. This medicine may be used for other purposes; ask your health care provider or pharmacist if you have questions. COMMON BRAND NAME(S): B-12 Compliance Kit, B-12 Injection Kit, Cyomin, Dodex, LA-12, Nutri-Twelve, Physicians EZ Use B-12, Primabalt What should I tell my care team before I take this medication? They need to know if you have any of these conditions: Kidney disease Leber's disease Megaloblastic anemia An unusual or allergic reaction to cyanocobalamin, cobalt, other medications, foods, dyes, or preservatives Pregnant or trying to get pregnant Breast-feeding How should I use this  medication? This medication is injected into a muscle or deeply under the skin. It is usually given in a clinic or care team's office. However, your care team may teach you how to inject yourself. Follow all instructions. Talk to your care team about the use of this medication in children. Special care may be needed. Overdosage: If you think you have taken too much of this medicine contact a poison control center or emergency room at once. NOTE: This medicine is only for you. Do not share this medicine with others. What if I miss a dose? If you are given your dose at a clinic or care team's office, call to reschedule your appointment. If you give your own injections, and you miss a dose, take it as soon as you can. If it is almost time for your next dose, take only that dose. Do not take double or extra doses. What may interact with this medication? Alcohol Colchicine This list may not describe all possible interactions. Give your health care provider a list of all the medicines, herbs, non-prescription drugs, or dietary supplements you use. Also tell them if you smoke, drink alcohol, or use illegal drugs. Some items may interact with your medicine. What should I watch for while using this medication? Visit your care team regularly. You may need blood work done while you are taking this medication. You may need to follow a special diet. Talk to your care team. Limit your alcohol intake and avoid smoking to get the best benefit. What side effects may I notice from receiving this medication? Side effects that you should report  to your care team as soon as possible: Allergic reactions--skin rash, itching, hives, swelling of the face, lips, tongue, or throat Swelling of the ankles, hands, or feet Trouble breathing Side effects that usually do not require medical attention (report to your care team if they continue or are bothersome): Diarrhea This list may not describe all possible side effects.  Call your doctor for medical advice about side effects. You may report side effects to FDA at 1-800-FDA-1088. Where should I keep my medication? Keep out of the reach of children. Store at room temperature between 15 and 30 degrees C (59 and 85 degrees F). Protect from light. Throw away any unused medication after the expiration date. NOTE: This sheet is a summary. It may not cover all possible information. If you have questions about this medicine, talk to your doctor, pharmacist, or health care provider.  2023 Elsevier/Gold Standard (2021-04-13 00:00:00)       To help prevent nausea and vomiting after your treatment, we encourage you to take your nausea medication as directed.  BELOW ARE SYMPTOMS THAT SHOULD BE REPORTED IMMEDIATELY: *FEVER GREATER THAN 100.4 F (38 C) OR HIGHER *CHILLS OR SWEATING *NAUSEA AND VOMITING THAT IS NOT CONTROLLED WITH YOUR NAUSEA MEDICATION *UNUSUAL SHORTNESS OF BREATH *UNUSUAL BRUISING OR BLEEDING *URINARY PROBLEMS (pain or burning when urinating, or frequent urination) *BOWEL PROBLEMS (unusual diarrhea, constipation, pain near the anus) TENDERNESS IN MOUTH AND THROAT WITH OR WITHOUT PRESENCE OF ULCERS (sore throat, sores in mouth, or a toothache) UNUSUAL RASH, SWELLING OR PAIN  UNUSUAL VAGINAL DISCHARGE OR ITCHING   Items with * indicate a potential emergency and should be followed up as soon as possible or go to the Emergency Department if any problems should occur.  Please show the CHEMOTHERAPY ALERT CARD or IMMUNOTHERAPY ALERT CARD at check-in to the Emergency Department and triage nurse.  Should you have questions after your visit or need to cancel or reschedule your appointment, please contact [facility] [phone]  and follow the prompts.  Office hours are 8:00 a.m. to 4:30 p.m. Monday - Friday. Please note that voicemails left after 4:00 p.m. may not be returned until the following business day.  We are closed weekends and major  holidays. You have access to a nurse at all times for urgent questions. Please call the main number to the clinic [phone] and follow the prompts.  For any non-urgent questions, you may also contact your provider using MyChart. We now offer e-Visits for anyone 15 and older to request care online for non-urgent symptoms. For details visit [email or web address].   Also download the MyChart app! Go to the app store, search "MyChart", open the app, select [facility], and log in with your MyChart username and password.  Masks are optional in the cancer centers. If you would like for your care team to wear a mask while they are taking care of you, please let them know. For doctor visits, patients may have with them one support person who is at least 74 years old. At this time, visitors are not allowed in the infusion area.

## 2022-03-09 NOTE — Progress Notes (Signed)
Patient tolerated injection with no complaints voiced.  Site clean and dry with no bruising or swelling noted at site.  See MAR for details.  Band aid applied.  Patient stable during and after injection.  Vss with discharge and left in satisfactory condition with no s/s of distress noted.

## 2022-03-10 ENCOUNTER — Ambulatory Visit (HOSPITAL_COMMUNITY)
Admission: RE | Admit: 2022-03-10 | Discharge: 2022-03-10 | Disposition: A | Discharge status: Home / Self Care | Payer: PPO | Source: Ambulatory Visit | Attending: Nephrology | Admitting: Nephrology

## 2022-03-10 DIAGNOSIS — N179 Acute kidney failure, unspecified: Secondary | ICD-10-CM | POA: Diagnosis not present

## 2022-03-10 DIAGNOSIS — X32XXXA Exposure to sunlight, initial encounter: Secondary | ICD-10-CM | POA: Diagnosis not present

## 2022-03-10 DIAGNOSIS — N170 Acute kidney failure with tubular necrosis: Secondary | ICD-10-CM | POA: Diagnosis not present

## 2022-03-10 DIAGNOSIS — L570 Actinic keratosis: Secondary | ICD-10-CM | POA: Diagnosis not present

## 2022-03-10 DIAGNOSIS — I872 Venous insufficiency (chronic) (peripheral): Secondary | ICD-10-CM | POA: Diagnosis not present

## 2022-03-11 ENCOUNTER — Other Ambulatory Visit: Payer: Self-pay | Admitting: Gastroenterology

## 2022-03-15 ENCOUNTER — Encounter (HOSPITAL_COMMUNITY): Payer: PPO | Admitting: Physical Therapy

## 2022-03-21 ENCOUNTER — Ambulatory Visit: Payer: Self-pay | Admitting: *Deleted

## 2022-03-21 NOTE — Chronic Care Management (AMB) (Signed)
  Chronic Care Management   Note  03/21/2022 Name: Stacey Summers MRN: [id number] DOB: 1945/11/13   Due to changes in the Chronic Care Management program, I am removing myself as the [facility] from the Care Team and closing [facility]. Patient was not scheduled to be followed by the RN Care Coordination nurse for [facility].   Patient does not have an open Care Plan with another CCM team member. Patient does not have a current CCM referral placed since 12/13/21. CCM enrollment status changed to "not enrolled".   Patient's PCP can place a new referral if the they needs Care Management or Care Coordination services in the future.  Chong Sicilian, BSN, RN-BC Proofreader Dial: [phone]

## 2022-03-21 NOTE — Patient Instructions (Signed)
Stacey Summers  At some point during the past 4 years, I have worked with you through the Chronic Care Management Program at [facility].  Due to program changes I am removing myself from your care team.   If you are currently active with another CCM Team Member, you will remain active with them unless they reach out to you with additional information.   If you feel that you need services in the future,  please talk with your primary care provider and request a new referral for Care Management or Care Coordination services. This does not affect your status as a patient at [facility].   Thank you for allowing me to participate in your your healthcare journey.  Chong Sicilian, BSN, RN-BC Proofreader Dial: [phone]

## 2022-03-28 ENCOUNTER — Ambulatory Visit: Payer: PPO | Admitting: Nurse Practitioner

## 2022-04-01 ENCOUNTER — Telehealth: Payer: Self-pay | Admitting: Gastroenterology

## 2022-04-01 NOTE — Telephone Encounter (Signed)
Patients daughter called to schedule a 3 month follow up for her mother with Dr. Silverio Decamp however Dr. Silverio Decamp does not have anything available at this time. She requested a call back form a nurse.

## 2022-04-01 NOTE — Telephone Encounter (Signed)
Spoke with daughter. Cirrhosis follow up needed. Appointment scheduled for 05/20/22 at 11:20 am.

## 2022-04-06 ENCOUNTER — Inpatient Hospital Stay: Payer: PPO

## 2022-04-07 ENCOUNTER — Other Ambulatory Visit: Payer: PPO

## 2022-04-07 DIAGNOSIS — K7460 Unspecified cirrhosis of liver: Secondary | ICD-10-CM | POA: Diagnosis not present

## 2022-04-07 DIAGNOSIS — I5032 Chronic diastolic (congestive) heart failure: Secondary | ICD-10-CM | POA: Diagnosis not present

## 2022-04-07 DIAGNOSIS — I129 Hypertensive chronic kidney disease with stage 1 through stage 4 chronic kidney disease, or unspecified chronic kidney disease: Secondary | ICD-10-CM | POA: Diagnosis not present

## 2022-04-07 DIAGNOSIS — E8809 Other disorders of plasma-protein metabolism, not elsewhere classified: Secondary | ICD-10-CM | POA: Diagnosis not present

## 2022-04-07 DIAGNOSIS — D7589 Other specified diseases of blood and blood-forming organs: Secondary | ICD-10-CM | POA: Diagnosis not present

## 2022-04-07 DIAGNOSIS — R82998 Other abnormal findings in urine: Secondary | ICD-10-CM | POA: Diagnosis not present

## 2022-04-07 DIAGNOSIS — D731 Hypersplenism: Secondary | ICD-10-CM | POA: Diagnosis not present

## 2022-04-07 DIAGNOSIS — I358 Other nonrheumatic aortic valve disorders: Secondary | ICD-10-CM | POA: Diagnosis not present

## 2022-04-07 DIAGNOSIS — D6959 Other secondary thrombocytopenia: Secondary | ICD-10-CM | POA: Diagnosis not present

## 2022-04-07 DIAGNOSIS — N170 Acute kidney failure with tubular necrosis: Secondary | ICD-10-CM | POA: Diagnosis not present

## 2022-04-07 DIAGNOSIS — Z7689 Persons encountering health services in other specified circumstances: Secondary | ICD-10-CM | POA: Diagnosis not present

## 2022-04-07 DIAGNOSIS — K7581 Nonalcoholic steatohepatitis (NASH): Secondary | ICD-10-CM | POA: Diagnosis not present

## 2022-04-13 ENCOUNTER — Other Ambulatory Visit: Payer: Self-pay | Admitting: Cardiovascular Disease

## 2022-04-13 ENCOUNTER — Other Ambulatory Visit: Payer: Self-pay | Admitting: Gastroenterology

## 2022-04-13 ENCOUNTER — Inpatient Hospital Stay: Payer: PPO | Attending: Hematology

## 2022-04-13 VITALS — BP 146/74 | HR 64 | Temp 97.7°F | Resp 18

## 2022-04-13 DIAGNOSIS — E538 Deficiency of other specified B group vitamins: Secondary | ICD-10-CM | POA: Diagnosis not present

## 2022-04-13 MED ORDER — CYANOCOBALAMIN 1000 MCG/ML IJ SOLN
1000.0000 ug | Freq: Once | INTRAMUSCULAR | Status: AC
  Administered 2022-04-13: 1000 ug via INTRAMUSCULAR
  Filled 2022-04-13: qty 1

## 2022-04-13 NOTE — Progress Notes (Signed)
Stacey Summers presents today for injection per the provider's orders.  B12 administration without incident; injection site WNL; see MAR for injection details.  Patient tolerated procedure well and without incident. No complaints at this time. Discharged from clinic by wheel chair in stable condition. Alert and oriented x 3. F/U with [facility] as scheduled.

## 2022-04-13 NOTE — Patient Instructions (Signed)
[facility]  Discharge Instructions: Thank you for choosing [facility] to provide your oncology and hematology care.  If you have a lab appointment with the [facility], please come in thru the Main Entrance and check in at the main information desk.  Wear comfortable clothing and clothing appropriate for easy access to any Portacath or PICC line.   We strive to give you quality time with your provider. You may need to reschedule your appointment if you arrive late (15 or more minutes).  Arriving late affects you and other patients whose appointments are after yours.  Also, if you miss three or more appointments without notifying the office, you may be dismissed from the clinic at the provider's discretion.      For prescription refill requests, have your pharmacy contact our office and allow 72 hours for refills to be completed.    Today you received the following chemotherapy and/or immunotherapy agents B12 injection.      To help prevent nausea and vomiting after your treatment, we encourage you to take your nausea medication as directed.  BELOW ARE SYMPTOMS THAT SHOULD BE REPORTED IMMEDIATELY: *FEVER GREATER THAN 100.4 F (38 C) OR HIGHER *CHILLS OR SWEATING *NAUSEA AND VOMITING THAT IS NOT CONTROLLED WITH YOUR NAUSEA MEDICATION *UNUSUAL SHORTNESS OF BREATH *UNUSUAL BRUISING OR BLEEDING *URINARY PROBLEMS (pain or burning when urinating, or frequent urination) *BOWEL PROBLEMS (unusual diarrhea, constipation, pain near the anus) TENDERNESS IN MOUTH AND THROAT WITH OR WITHOUT PRESENCE OF ULCERS (sore throat, sores in mouth, or a toothache) UNUSUAL RASH, SWELLING OR PAIN  UNUSUAL VAGINAL DISCHARGE OR ITCHING   Items with * indicate a potential emergency and should be followed up as soon as possible or go to the Emergency Department if any problems should occur.  Please show the CHEMOTHERAPY ALERT CARD or IMMUNOTHERAPY ALERT CARD at check-in to the  Emergency Department and triage nurse.  Should you have questions after your visit or need to cancel or reschedule your appointment, please contact [facility] [phone]  and follow the prompts.  Office hours are 8:00 a.m. to 4:30 p.m. Monday - Friday. Please note that voicemails left after 4:00 p.m. may not be returned until the following business day.  We are closed weekends and major holidays. You have access to a nurse at all times for urgent questions. Please call the main number to the clinic [phone] and follow the prompts.  For any non-urgent questions, you may also contact your provider using MyChart. We now offer e-Visits for anyone 74 and older to request care online for non-urgent symptoms. For details visit [email or web address].   Also download the MyChart app! Go to the app store, search "MyChart", open the app, select [facility], and log in with your MyChart username and password.  Masks are optional in the cancer centers. If you would like for your care team to wear a mask while they are taking care of you, please let them know. You may have one support person who is at least 74 years old accompany you for your appointments.

## 2022-04-14 DIAGNOSIS — D7589 Other specified diseases of blood and blood-forming organs: Secondary | ICD-10-CM | POA: Diagnosis not present

## 2022-04-14 DIAGNOSIS — D6959 Other secondary thrombocytopenia: Secondary | ICD-10-CM | POA: Diagnosis not present

## 2022-04-14 DIAGNOSIS — R778 Other specified abnormalities of plasma proteins: Secondary | ICD-10-CM | POA: Diagnosis not present

## 2022-04-14 DIAGNOSIS — Z8679 Personal history of other diseases of the circulatory system: Secondary | ICD-10-CM | POA: Diagnosis not present

## 2022-04-14 DIAGNOSIS — I9589 Other hypotension: Secondary | ICD-10-CM | POA: Diagnosis not present

## 2022-04-14 DIAGNOSIS — E870 Hyperosmolality and hypernatremia: Secondary | ICD-10-CM | POA: Diagnosis not present

## 2022-04-14 DIAGNOSIS — K7460 Unspecified cirrhosis of liver: Secondary | ICD-10-CM | POA: Diagnosis not present

## 2022-04-14 DIAGNOSIS — D841 Defects in the complement system: Secondary | ICD-10-CM | POA: Diagnosis not present

## 2022-04-14 DIAGNOSIS — N170 Acute kidney failure with tubular necrosis: Secondary | ICD-10-CM | POA: Diagnosis not present

## 2022-04-14 DIAGNOSIS — I5032 Chronic diastolic (congestive) heart failure: Secondary | ICD-10-CM | POA: Diagnosis not present

## 2022-04-14 DIAGNOSIS — N189 Chronic kidney disease, unspecified: Secondary | ICD-10-CM | POA: Diagnosis not present

## 2022-04-20 DIAGNOSIS — I890 Lymphedema, not elsewhere classified: Secondary | ICD-10-CM | POA: Diagnosis not present

## 2022-04-21 ENCOUNTER — Ambulatory Visit: Payer: PPO | Attending: Cardiovascular Disease | Admitting: Cardiovascular Disease

## 2022-04-21 ENCOUNTER — Encounter: Payer: Self-pay | Admitting: Cardiovascular Disease

## 2022-04-21 VITALS — BP 139/68 | HR 68 | Ht 62.0 in | Wt 259.4 lb

## 2022-04-21 DIAGNOSIS — R600 Localized edema: Secondary | ICD-10-CM | POA: Diagnosis not present

## 2022-04-21 DIAGNOSIS — K7581 Nonalcoholic steatohepatitis (NASH): Secondary | ICD-10-CM | POA: Diagnosis not present

## 2022-04-21 DIAGNOSIS — E7800 Pure hypercholesterolemia, unspecified: Secondary | ICD-10-CM

## 2022-04-21 DIAGNOSIS — R002 Palpitations: Secondary | ICD-10-CM | POA: Diagnosis not present

## 2022-04-21 DIAGNOSIS — K7469 Other cirrhosis of liver: Secondary | ICD-10-CM

## 2022-04-21 DIAGNOSIS — I10 Essential (primary) hypertension: Secondary | ICD-10-CM

## 2022-04-21 DIAGNOSIS — I50812 Chronic right heart failure: Secondary | ICD-10-CM | POA: Diagnosis not present

## 2022-04-21 DIAGNOSIS — G4733 Obstructive sleep apnea (adult) (pediatric): Secondary | ICD-10-CM

## 2022-04-21 DIAGNOSIS — I5032 Chronic diastolic (congestive) heart failure: Secondary | ICD-10-CM | POA: Diagnosis not present

## 2022-04-21 DIAGNOSIS — K7460 Unspecified cirrhosis of liver: Secondary | ICD-10-CM

## 2022-04-21 MED ORDER — FUROSEMIDE 40 MG PO TABS: ORAL_TABLET | ORAL | 1 refills | Status: DC

## 2022-04-21 NOTE — Progress Notes (Signed)
Cardiology office note   Date:  04/21/2022   ID:  Stacey Summers, DOB [birth year], MRN [id number]   PCP:  Janora Norlander, DO  Cardiologist:  Sanda Klein, MD  Electrophysiologist:  None   Evaluation Performed:  Follow-Up Visit  Chief Complaint: Nausea  History of Present Illness:    Stacey Summers is a 76 y.o. female with Morbid obesity, obstructive sleep apnea, essential hypertension, hyperlipidemia and chronic leg edema.  She has cirrhosis due to NASH.  She has a history of palpitations that responded reasonably well to treatment with diltiazem.  In the office today her rhythm was trigeminal, but she was unaware of palpitations.  She continues have problems with lower extremity edema which is likely multifactorial (lymphedema, cirrhosis, chronic right heart failure).  She improved after treatment with diuretics, losing about 15 pounds, but then had elevation in creatinine 2.0 and mild hyperkalemia at 5.6.  Stopped her spironolactone and retreated on the furosemide dose, unfortunately this has led to recurrence of the edema.  She has occasional weeping from small wounds on her shins, but has not had overt cellulitis, blisters.  She has labored breathing and wheezing when she lies in bed.  She is very sedentary.  She has abdominal distention since reducing the diuretics.  Her current dose of furosemide to 60 mg once daily.  She just started taking spironolactone 12.5 mg daily today.  Since then she has seen a nephrologist, Dr. Theador Hawthorne in [place] and he advised that she should restart treatment with spironolactone, after her renal function improved (creatinine was 1.06 and potassium was 3.7 on 03/01/2022).  Creatinine increased when her weight was down to 245 pounds.  However, we had actually estimated her "dry weight" to be around 220 pounds based on previous data.  Today at home she weighed about 257 pounds, 2 pounds less than our office scale.  Did not complain much of  nausea today, as she has done in the past.  She underwent an echocardiogram on January 18, 2022 that showed evidence of elevated left heart filling pressures (grade 2 diastolic dysfunction).  Continues to have spotty compliance with CPAP and similar has spotty compliance with compression stockings and Velcro wraps for her lymphedema.   She is concerned about possible "blood clots" although I am not sure why this is specifically her concern.  Her mother died suddenly without clear explanation by postmortem study at age 31.  Her brother, who had multiple health problems diabetes sleep at age 66 not long ago.  She thinks maybe they had clots.   Past Medical History:  Diagnosis Date   Allergy    seasonal   Anxiety    Arthritis    hands, back    B12 deficiency 03/31/2021   Cataract    surgery to remove   Chronic back pain    lower back    Cirrhosis of liver without ascites ([place])    recent dx 07/11/18   Closed fracture of right tibial plateau 01/31/2018   Complex tear of medial meniscus of right knee 03/27/2018   COPD (chronic obstructive pulmonary disease) ([place])    no inhaler   Diabetes mellitus without complication (HCC)    DJD (degenerative joint disease)    Fibromyalgia    Genetic testing 03/08/2017   Ms. Hoogland underwent genetic counseling and testing for hereditary cancer syndromes on 02/21/2017. Her results were negative for mutations in all 83 genes analyzed by [facility]'s 83-gene Multi-Cancers Panel. Genes analyzed include: ALK, APC, ATM,  AXIN2, BAP1, BARD1, BLM, BMPR1A, BRCA1, BRCA2, BRIP1, CASR, CDC73, CDH1, CDK4, CDKN1B, CDKN1C, CDKN2A, CEBPA, CHEK2, CTNNA1, DICER1, DIS3L2, EGFR, EPCAM   GERD (gastroesophageal reflux disease)    Hearing loss    bilateral - no hearing aids   History of abnormal mammogram    History of arthroplasty of right knee 07/06/2018   Hyperlipidemia    Hypertension    Irregular heart beat    Neuromuscular disorder (HCC)    fibromyalgia   Sleep apnea     cpap some   Stress fracture of right tibia 03/27/2018   SVD (spontaneous vaginal delivery)    x 2   Tubular adenoma of colon 05/09/12   "innumerable number" of flat polyps   Past Surgical History:  Procedure Laterality Date   CARPAL TUNNEL RELEASE Left 10/20/14   [facility]   CATARACT EXTRACTION, BILATERAL     CESAREAN SECTION  1977   CHOLECYSTECTOMY  1990   COLONOSCOPY     IR RADIOLOGIST EVAL & MGMT  07/16/2019   KNEE ARTHROSCOPY WITH SUBCHONDROPLASTY Right 03/27/2018   Procedure: Right knee arthroscopic partial medial meniscus repair versus menisectomy with medial tibia subchondroplasty;  Surgeon: Nicholes Stairs, MD;  Location: [place];  Service: Orthopedics;  Laterality: Right;  120 mins   LARYNX SURGERY     POLYP REMOVAL   MULTIPLE TOOTH EXTRACTIONS     POLYPECTOMY     vocal cord   TONSILLECTOMY  1965   TUBAL LIGATION     TUMOR REMOVAL     benign fatty tumors on abdomen   UPPER GASTROINTESTINAL ENDOSCOPY       Current Meds  Medication Sig   albuterol (VENTOLIN HFA) 108 (90 Base) MCG/ACT inhaler Inhale 2 puffs into the lungs every 6 (six) hours as needed for wheezing or shortness of breath.   atorvastatin (LIPITOR) 20 MG tablet Take 1 tablet (20 mg total) by mouth daily.   benazepril (LOTENSIN) 20 MG tablet Take 1 tablet (20 mg total) by mouth daily.   Biotin 5000 MCG SUBL Place under the tongue.   blood glucose meter kit and supplies KIT Dispense based on patient and insurance preference. Use up to four times daily as directed. (FOR ICD-9 250.00, 250.01).   Blood Glucose Monitoring Suppl (ONETOUCH VERIO FLEX SYSTEM) w/Device KIT Check Bs up to 4 times daily Dx E11.9,   Cholecalciferol (VITAMIN D3) 5000 units CAPS Take 5,000 Units by mouth daily.   diltiazem (CARDIZEM CD) 120 MG 24 hr capsule Take 1 capsule by mouth once daily   fluticasone (FLONASE) 50 MCG/ACT nasal spray USE 1 SPRAY(S) IN EACH NOSTRIL IN THE MORNING AND AT BEDTIME   glucose blood (ONETOUCH VERIO) test  strip CHECK GLUCOSE UP TO 4 TIMES DAILY Dx E11.69   lactulose (CHRONULAC) 10 GM/15ML solution TAKE 15 ML BY MOUTH  THREE TIMES DAILY AS NEEDED   meclizine (ANTIVERT) 25 MG tablet Take 1 tablet (25 mg total) by mouth 3 (three) times daily as needed for dizziness.   ONETOUCH DELICA LANCETS 27P MISC USE ONE LANCET TO CHECK GLUCOSE ONCE DAILY AND AS NEEDED   rifAXIMin (XIFAXAN PO) Take 500 mg by mouth 2 (two) times daily.   spironolactone (ALDACTONE) 25 MG tablet Take 12.5 mg by mouth daily in the afternoon.   traMADol (ULTRAM) 50 MG tablet Take 1 tablet (50 mg total) by mouth every 12 (twelve) hours as needed for moderate pain.   [DISCONTINUED] furosemide (LASIX) 40 MG tablet TAKE 1 TABLET BY MOUTH ONCE DAILY  IN THE MORNING THEN TAKE 1/2 (ONE-HALF) TABLET ONCE DAILY AT 1 PM     Allergies:   Latex   Social History   Tobacco Use   Smoking status: Former    Packs/day: 1.00    Years: 3.00    Total pack years: 3.00    Types: Cigarettes    Quit date: 08/15/1969    Years since quitting: 52.7   Smokeless tobacco: Never  Vaping Use   Vaping Use: Never used  Substance Use Topics   Alcohol use: No    Alcohol/week: 0.0 standard drinks of alcohol   Drug use: No     Family Hx: The patient's family history includes Colon cancer in her father and paternal uncle; Colon polyps in her brother; Colon polyps (age of onset: 93) in her son; Heart attack (age of onset: 62) in her son; Heart disease in her mother; Heart failure in her maternal grandmother, maternal uncle, and mother; Kidney cancer in her cousin; Leukemia in her maternal aunt; Other (age of onset: 32) in an other family member; Prostate cancer in her father; Spina bifida in her brother; Stomach cancer in her paternal aunt. There is no history of Esophageal cancer, Rectal cancer, or Breast cancer.  ROS:   Please see the history of present illness.    All other systems are reviewed and are negative. Prior CV studies:   The following studies  were reviewed today:  Echocardiogram 01/18/2022      ECHOCARDIOGRAM REPORT         Patient Name:   Stacey Summers Date of Exam: 01/18/2022  Medical Rec #:  [id number]         Height:       62.0 in  Accession #:    [id number]        Weight:       244.6 lb  Date of Birth:  11-13-1945          BSA:          2.082 m  Patient Age:    77 years          BP:           139/69 mmHg  Patient Gender: F                 HR:           52 bpm.  Exam Location:  [facility]   Procedure: 2D Echo, Cardiac Doppler and Color Doppler   Indications:    Heart Failure with reduced ejection fraction     History:        Patient has prior history of Echocardiogram examinations,  most                  recent 03/04/2013. COPD, Signs/Symptoms:Fatigue, Murmur and                  Dyspnea; Risk Factors:Hypertension, Diabetes and  Dyslipidemia.     Sonographer:    Wenda Low  Referring Phys: [phone] [place]      Sonographer Comments: Patient is morbidly obese. Image acquisition  challenging due to COPD.  IMPRESSIONS     1. Left ventricular ejection fraction, by estimation, is 60 to 65%. The  left ventricle has normal function. The left ventricle has no regional  wall motion abnormalities. Left ventricular diastolic parameters are  consistent with Grade II diastolic  dysfunction (pseudonormalization). Elevated left ventricular end-diastolic  pressure.   2. Right ventricular systolic  function is normal. The right ventricular  size is normal. There is normal pulmonary artery systolic pressure.   3. Left atrial size was moderately dilated.   4. The mitral valve is abnormal. Trivial mitral valve regurgitation. No  evidence of mitral stenosis.   5. Mean gradient across valve 13 mmHg calculated AVA 2.5 cm2 . The aortic  valve is tricuspid. There is moderate calcification of the aortic valve.  There is moderate thickening of the aortic valve. Aortic valve  regurgitation is not visualized. Aortic  valve  sclerosis/calcification is present, without any evidence of aortic  stenosis.   6. The inferior vena cava is normal in size with greater than 50%  respiratory variability, suggesting right atrial pressure of 3 mmHg.    Labs/Other Tests and Data Reviewed:    EKG: ECG is not ordered today  01/21/2022: BNP 208.1 02/09/2022: ALT 47; Hemoglobin 14.5; Platelets 70 03/01/2022: BUN 37; Creatinine, Ser 1.46; Potassium 4.1; Sodium 142   04/07/2022 Creatinine 1.06, sodium 146, potassium 3.7, albumin 3.0, AST 59, ALT 42, hemoglobin 13.4, platelets 66 K Creatinine clearance by 24 hour collection was 63 mL/minute  Recent Labs:   Recent Lipid Panel Lipid Panel     Component Value Date/Time   CHOL 103 06/28/2021 1106   CHOL 162 12/20/2012 1044   TRIG 55 06/28/2021 1106   TRIG 91 12/25/2014 1020   TRIG 83 12/20/2012 1044   HDL 48 06/28/2021 1106   HDL 42 12/25/2014 1020   HDL 42 12/20/2012 1044   CHOLHDL 2.1 06/28/2021 1106   LDLCALC 42 06/28/2021 1106   LDLCALC 59 01/29/2014 1031   LDLCALC 103 (H) 12/20/2012 1044   LABVLDL 13 06/28/2021 1106    Wt Readings from Last 3 Encounters:  04/21/22 259 lb 6.4 oz (117.7 kg)  03/01/22 246 lb 9.6 oz (111.9 kg)  02/24/22 260 lb (117.9 kg)     Objective:    Vital Signs:  BP 139/68 (BP Location: Left Arm, Patient Position: Sitting, Cuff Size: Large) Comment (BP Location): forearm  Pulse 68   Ht 5' 2"  (1.575 m)   Wt 259 lb 6.4 oz (117.7 kg)   SpO2 96%   BMI 47.44 kg/m     General: Alert, oriented x3, no distress, morbidly obese Head: no evidence of trauma, PERRL, EOMI, no exophtalmos or lid lag, no myxedema, no xanthelasma; normal ears, nose and oropharynx Neck: 8-9 cm elevation in jugular venous pulsations and very prompt hepatojugular reflux; brisk carotid pulses without delay and no carotid bruits Chest: clear to auscultation, no signs of consolidation by percussion or palpation, normal fremitus, symmetrical and full respiratory  excursions Cardiovascular: normal position and quality of the apical impulse, regular rhythm, normal first and second heart sounds, no murmurs, rubs or gallops Abdomen: Mildly distended, no tenderness, hard to say whether there is any ascites, no masses by palpation, no abnormal pulsatility or arterial bruits, normal bowel sounds, no hepatosplenomegaly Extremities: 3+ symmetrical edema without redness or tenderness, no active weeping at this time, no visible blisters or ulcers. Neurological: grossly nonfocal except very hard of hearing.  No asterixis. Psych: Normal mood and affect     ASSESSMENT & PLAN:    CHF: Edema is multifactorial, but there is evidence of both right and left heart failure (diastolic left ventricular dysfunction).  Cirrhosis/hypoalbuminemia, inconsistent treatment of sleep apnea and local problems with lymphedema are contributory continues to be a problem.  Have restarted the spironolactone.  We will increase the furosemide to 80 mg in the  morning and 40 mg in the afternoon until her weight on her home scale reaches 248-250 pounds.  Once at that target weight, reduce the dose of furosemide back to 60 mg once daily.   Normal LV systolic function.  Echocardiogram did show evidence of diastolic dysfunction related increasing left heart pressures.  Has supine wheezing consistent with left heart failure. AKI: Completely resolved.  24-hour urine collection showed creatinine clearance of 63 mL/min. She has labs scheduled in about 10 days in [place].  OSA: Again reinforced importance of compliance with a CPAP whenever she sleeps. Palpitations: Related to atrial ectopy.  Unfortunately diltiazem may be contributing to the edema but has been very beneficial for her palpitations.  We will try to continue it. HTN: Adequate control.  Will likely drop a little bit more as she has restarted the spironolactone. Cirrhosis: Presumed to be secondary to NASH.  Has thrombocytopenia  suggesting that she does have portal hypertension and splenomegaly.  Her most recent labs do show some parenchymal insufficiency with low albumin level, elevated INR and bilirubin.  No clinical evidence of ascites or other signs of portal hypertension at this time. HLP: LDL cholesterol in target range.  Even her HDL is acceptable. Severe obesity: Likely the major underlying cause for her dyslipidemia, hypertension, sleep apnea and NASH. History of anemia: Normal hemoglobin and iron stores.  High MCV.   Patient Instructions  Medication Instructions:  FUROSEMIDE: Take 60 mg once daily (one and a half tablets). For a weight of 250 and higher, take 80 mg in the morning and 40 mg in the late afternoon   *If you need a refill on your cardiac medications before your next appointment, please call your pharmacy*   Lab Work: None ordered If you have labs (blood work) drawn today and your tests are completely normal, you will receive your results only by: [place] (if you have MyChart) OR A paper copy in the mail If you have any lab test that is abnormal or we need to change your treatment, we will call you to review the results.   Testing/Procedures: None ordered   Follow-Up: At [facility], you and your health needs are our priority.  As part of our continuing mission to provide you with exceptional heart care, we have created designated Provider Care Teams.  These Care Teams include your primary Cardiologist (physician) and Advanced Practice Providers (APPs -  Physician Assistants and Nurse Practitioners) who all work together to provide you with the care you need, when you need it.  We recommend signing up for the patient portal called "MyChart".  Sign up information is provided on this After Visit Summary.  MyChart is used to connect with patients for Virtual Visits (Telemedicine).  Patients are able to view lab/test results, encounter notes, upcoming appointments, etc.   Non-urgent messages can be sent to your provider as well.   To learn more about what you can do with MyChart, go to [email or web address].    Your next appointment:   3 month(s)  The format for your next appointment:   In Person  Provider:   Sanda Klein, MD     Important Information About Sugar         Signed, Sanda Klein, MD  04/21/2022 6:45 PM    [place]

## 2022-04-21 NOTE — Patient Instructions (Signed)
Medication Instructions:  FUROSEMIDE: Take 60 mg once daily (one and a half tablets). For a weight of 250 and higher, take 80 mg in the morning and 40 mg in the late afternoon   *If you need a refill on your cardiac medications before your next appointment, please call your pharmacy*   Lab Work: None ordered If you have labs (blood work) drawn today and your tests are completely normal, you will receive your results only by: [place] (if you have MyChart) OR A paper copy in the mail If you have any lab test that is abnormal or we need to change your treatment, we will call you to review the results.   Testing/Procedures: None ordered   Follow-Up: At [facility] you and your health needs are our priority.  As part of our continuing mission to provide you with exceptional heart care, we have created designated Provider Care Teams.  These Care Teams include your primary Cardiologist (physician) and Advanced Practice Providers (APPs -  Physician Assistants and Nurse Practitioners) who all work together to provide you with the care you need, when you need it.  We recommend signing up for the patient portal called "MyChart".  Sign up information is provided on this After Visit Summary.  MyChart is used to connect with patients for Virtual Visits (Telemedicine).  Patients are able to view lab/test results, encounter notes, upcoming appointments, etc.  Non-urgent messages can be sent to your provider as well.   To learn more about what you can do with MyChart, go to [email or web address].    Your next appointment:   3 month(s)  The format for your next appointment:   In Person  Provider:   Sanda Klein, MD     Important Information About Sugar

## 2022-05-02 ENCOUNTER — Other Ambulatory Visit: Payer: Self-pay | Admitting: Family Medicine

## 2022-05-02 DIAGNOSIS — R42 Dizziness and giddiness: Secondary | ICD-10-CM

## 2022-05-03 ENCOUNTER — Other Ambulatory Visit: Payer: PPO

## 2022-05-03 DIAGNOSIS — D7589 Other specified diseases of blood and blood-forming organs: Secondary | ICD-10-CM | POA: Diagnosis not present

## 2022-05-03 DIAGNOSIS — D841 Defects in the complement system: Secondary | ICD-10-CM | POA: Diagnosis not present

## 2022-05-03 DIAGNOSIS — I9589 Other hypotension: Secondary | ICD-10-CM | POA: Diagnosis not present

## 2022-05-03 DIAGNOSIS — E870 Hyperosmolality and hypernatremia: Secondary | ICD-10-CM | POA: Diagnosis not present

## 2022-05-03 DIAGNOSIS — D6959 Other secondary thrombocytopenia: Secondary | ICD-10-CM | POA: Diagnosis not present

## 2022-05-03 DIAGNOSIS — D731 Hypersplenism: Secondary | ICD-10-CM | POA: Diagnosis not present

## 2022-05-03 DIAGNOSIS — K7460 Unspecified cirrhosis of liver: Secondary | ICD-10-CM | POA: Diagnosis not present

## 2022-05-03 DIAGNOSIS — R778 Other specified abnormalities of plasma proteins: Secondary | ICD-10-CM | POA: Diagnosis not present

## 2022-05-03 DIAGNOSIS — I5032 Chronic diastolic (congestive) heart failure: Secondary | ICD-10-CM | POA: Diagnosis not present

## 2022-05-03 DIAGNOSIS — E1122 Type 2 diabetes mellitus with diabetic chronic kidney disease: Secondary | ICD-10-CM | POA: Diagnosis not present

## 2022-05-03 DIAGNOSIS — N189 Chronic kidney disease, unspecified: Secondary | ICD-10-CM | POA: Diagnosis not present

## 2022-05-04 ENCOUNTER — Inpatient Hospital Stay: Payer: PPO

## 2022-05-06 ENCOUNTER — Telehealth (INDEPENDENT_AMBULATORY_CARE_PROVIDER_SITE_OTHER): Payer: PPO | Admitting: Nurse Practitioner

## 2022-05-06 ENCOUNTER — Encounter: Payer: Self-pay | Admitting: Nurse Practitioner

## 2022-05-06 DIAGNOSIS — R0981 Nasal congestion: Secondary | ICD-10-CM

## 2022-05-06 DIAGNOSIS — R0902 Hypoxemia: Secondary | ICD-10-CM

## 2022-05-06 DIAGNOSIS — R052 Subacute cough: Secondary | ICD-10-CM

## 2022-05-06 MED ORDER — BENZONATATE 100 MG PO CAPS: 100.0000 mg | ORAL_CAPSULE | Freq: Three times a day (TID) | ORAL | 0 refills | Status: DC | PRN

## 2022-05-06 MED ORDER — FLUTICASONE PROPIONATE 50 MCG/ACT NA SUSP: 2.0000 | Freq: Every day | NASAL | 6 refills | Status: DC

## 2022-05-06 MED ORDER — PREDNISONE 5 MG PO TABS: 5.0000 mg | ORAL_TABLET | Freq: Every day | ORAL | 0 refills | Status: DC

## 2022-05-06 NOTE — Progress Notes (Signed)
Virtual Visit  Note Due to COVID-19 pandemic this visit was conducted virtually. This visit type was conducted due to national recommendations for restrictions regarding the COVID-19 Pandemic (e.g. social distancing, sheltering in place) in an effort to limit this patient's exposure and mitigate transmission in our community. All issues noted in this document were discussed and addressed.  A physical exam was not performed with this format.  I connected with Stacey Summers on 05/06/22 at 3:00 pm  by telephone and verified that I am speaking with the correct person using two identifiers. Stacey Summers is currently located at home with care giver during visit. The provider, Ivy Lynn, NP is located in their office at time of visit.  I discussed the limitations, risks, security and privacy concerns of performing an evaluation and management service by telephone and the availability of in person appointments. I also discussed with the patient that there may be a patient responsible charge related to this service. The patient expressed understanding and agreed to proceed.   History and Present Illness:  URI  This is a new problem. The problem has been unchanged. There has been no fever. The fever has been present for Less than 1 day. Associated symptoms include coughing. Pertinent negatives include no ear pain, headaches, rash, sore throat or wheezing. Associated symptoms comments: Hypoxia,, 02 saturation below 90% . She has tried nothing for the symptoms.  Cough This is a new problem. The current episode started yesterday. The problem has been unchanged. The cough is Non-productive. Associated symptoms include shortness of breath. Pertinent negatives include no chills, ear congestion, ear pain, headaches, rash, sore throat or wheezing. Nothing aggravates the symptoms. She has tried a beta-agonist inhaler for the symptoms. The treatment provided mild relief.  Shortness of Breath This is a  new problem. The current episode started yesterday. The problem occurs constantly. The problem has been unchanged. Pertinent negatives include no ear pain, headaches, leg swelling, rash, sore throat, syncope or wheezing. She has tried beta agonist inhalers for the symptoms. The treatment provided mild relief.     Review of Systems  Constitutional:  Negative for chills.  HENT:  Negative for ear pain and sore throat.   Respiratory:  Positive for cough and shortness of breath. Negative for wheezing.   Cardiovascular: Negative.  Negative for leg swelling and syncope.  Skin: Negative.  Negative for itching and rash.  Neurological:  Negative for headaches.     Observations/Objective: Televisit patient not in distress.  Assessment and Plan: Patient presents with symptoms of acute cough and hypoxia.  Advised patient to Take meds as prescribed - Use a cool mist humidifier  -Use saline nose sprays frequently -Force fluids -Albuterol as prescribed -Use anxiety medication as prescribed -Tessalon Perles for cough -Continue monitoring oxygen saturation for hypoxia, goal is for O2 sat to be above 93% -For fever or aches or pains- take Tylenol or ibuprofen. -If symptoms do not improve, she may need to be COVID tested to rule this out    Follow Up Instructions: Follow-up with unresolved symptoms. Patient/caregiver understands that if hypoxia is unresolved to please call 911 or go to emergency department.    I discussed the assessment and treatment plan with the patient. The patient was provided an opportunity to ask questions and all were answered. The patient agreed with the plan and demonstrated an understanding of the instructions.   The patient was advised to call back or seek an in-person evaluation if the symptoms worsen  or if the condition fails to improve as anticipated.  The above assessment and management plan was discussed with the patient. The patient verbalized understanding of  and has agreed to the management plan. Patient is aware to call the clinic if symptoms persist or worsen. Patient is aware when to return to the clinic for a follow-up visit. Patient educated on when it is appropriate to go to the emergency department.   Time call ended:  3:15 pm   I provided 15 minutes of  non face-to-face time during this encounter.    Ivy Lynn, NP

## 2022-05-08 ENCOUNTER — Other Ambulatory Visit: Payer: Self-pay | Admitting: Internal Medicine

## 2022-05-11 ENCOUNTER — Inpatient Hospital Stay: Payer: PPO | Attending: Hematology

## 2022-05-11 VITALS — BP 142/65 | HR 60 | Temp 98.0°F | Resp 16

## 2022-05-11 DIAGNOSIS — R809 Proteinuria, unspecified: Secondary | ICD-10-CM | POA: Diagnosis not present

## 2022-05-11 DIAGNOSIS — E1122 Type 2 diabetes mellitus with diabetic chronic kidney disease: Secondary | ICD-10-CM | POA: Diagnosis not present

## 2022-05-11 DIAGNOSIS — E538 Deficiency of other specified B group vitamins: Secondary | ICD-10-CM | POA: Diagnosis not present

## 2022-05-11 DIAGNOSIS — D7589 Other specified diseases of blood and blood-forming organs: Secondary | ICD-10-CM | POA: Diagnosis not present

## 2022-05-11 DIAGNOSIS — D731 Hypersplenism: Secondary | ICD-10-CM | POA: Diagnosis not present

## 2022-05-11 DIAGNOSIS — E1129 Type 2 diabetes mellitus with other diabetic kidney complication: Secondary | ICD-10-CM | POA: Diagnosis not present

## 2022-05-11 DIAGNOSIS — N189 Chronic kidney disease, unspecified: Secondary | ICD-10-CM | POA: Diagnosis not present

## 2022-05-11 DIAGNOSIS — D6959 Other secondary thrombocytopenia: Secondary | ICD-10-CM | POA: Diagnosis not present

## 2022-05-11 DIAGNOSIS — I5032 Chronic diastolic (congestive) heart failure: Secondary | ICD-10-CM | POA: Diagnosis not present

## 2022-05-11 DIAGNOSIS — K7460 Unspecified cirrhosis of liver: Secondary | ICD-10-CM | POA: Diagnosis not present

## 2022-05-11 MED ORDER — CYANOCOBALAMIN 1000 MCG/ML IJ SOLN
1000.0000 ug | Freq: Once | INTRAMUSCULAR | Status: AC
  Administered 2022-05-11: 1000 ug via INTRAMUSCULAR
  Filled 2022-05-11: qty 1

## 2022-05-11 NOTE — Progress Notes (Signed)
Patient tolerated injection with no complaints voiced. Site clean and dry with no bruising or swelling noted at site. See MAR for details. Band aid applied.  Patient stable during and after injection. VSS with discharge and left in satisfactory condition with no s/s of distress noted.

## 2022-05-11 NOTE — Patient Instructions (Signed)
[facility]  Discharge Instructions: Thank you for choosing [facility] to provide your oncology and hematology care.  If you have a lab appointment with the [facility], please come in thru the Main Entrance and check in at the main information desk.  Wear comfortable clothing and clothing appropriate for easy access to any Portacath or PICC line.   We strive to give you quality time with your provider. You may need to reschedule your appointment if you arrive late (15 or more minutes).  Arriving late affects you and other patients whose appointments are after yours.  Also, if you miss three or more appointments without notifying the office, you may be dismissed from the clinic at the provider's discretion.      For prescription refill requests, have your pharmacy contact our office and allow 72 hours for refills to be completed.    Today you received the following B12 injection, return as scheduled.   To help prevent nausea and vomiting after your treatment, we encourage you to take your nausea medication as directed.  BELOW ARE SYMPTOMS THAT SHOULD BE REPORTED IMMEDIATELY: *FEVER GREATER THAN 100.4 F (38 C) OR HIGHER *CHILLS OR SWEATING *NAUSEA AND VOMITING THAT IS NOT CONTROLLED WITH YOUR NAUSEA MEDICATION *UNUSUAL SHORTNESS OF BREATH *UNUSUAL BRUISING OR BLEEDING *URINARY PROBLEMS (pain or burning when urinating, or frequent urination) *BOWEL PROBLEMS (unusual diarrhea, constipation, pain near the anus) TENDERNESS IN MOUTH AND THROAT WITH OR WITHOUT PRESENCE OF ULCERS (sore throat, sores in mouth, or a toothache) UNUSUAL RASH, SWELLING OR PAIN  UNUSUAL VAGINAL DISCHARGE OR ITCHING   Items with * indicate a potential emergency and should be followed up as soon as possible or go to the Emergency Department if any problems should occur.  Please show the CHEMOTHERAPY ALERT CARD or IMMUNOTHERAPY ALERT CARD at check-in to the Emergency Department and  triage nurse.  Should you have questions after your visit or need to cancel or reschedule your appointment, please contact [facility] [phone]  and follow the prompts.  Office hours are 8:00 a.m. to 4:30 p.m. Monday - Friday. Please note that voicemails left after 4:00 p.m. may not be returned until the following business day.  We are closed weekends and major holidays. You have access to a nurse at all times for urgent questions. Please call the main number to the clinic [phone] and follow the prompts.  For any non-urgent questions, you may also contact your provider using MyChart. We now offer e-Visits for anyone 31 and older to request care online for non-urgent symptoms. For details visit [email or web address].   Also download the MyChart app! Go to the app store, search "MyChart", open the app, select [facility], and log in with your MyChart username and password.  Masks are optional in the cancer centers. If you would like for your care team to wear a mask while they are taking care of you, please let them know. You may have one support person who is at least 74 years old accompany you for your appointments.

## 2022-05-20 ENCOUNTER — Other Ambulatory Visit (INDEPENDENT_AMBULATORY_CARE_PROVIDER_SITE_OTHER): Payer: PPO

## 2022-05-20 ENCOUNTER — Ambulatory Visit (INDEPENDENT_AMBULATORY_CARE_PROVIDER_SITE_OTHER): Payer: PPO | Admitting: Gastroenterology

## 2022-05-20 ENCOUNTER — Encounter: Payer: Self-pay | Admitting: Gastroenterology

## 2022-05-20 VITALS — BP 118/76 | HR 71 | Ht 62.0 in | Wt 236.0 lb

## 2022-05-20 DIAGNOSIS — E8770 Fluid overload, unspecified: Secondary | ICD-10-CM

## 2022-05-20 DIAGNOSIS — K7682 Hepatic encephalopathy: Secondary | ICD-10-CM

## 2022-05-20 DIAGNOSIS — K7460 Unspecified cirrhosis of liver: Secondary | ICD-10-CM

## 2022-05-20 DIAGNOSIS — K7581 Nonalcoholic steatohepatitis (NASH): Secondary | ICD-10-CM | POA: Diagnosis not present

## 2022-05-20 DIAGNOSIS — R601 Generalized edema: Secondary | ICD-10-CM

## 2022-05-20 DIAGNOSIS — R188 Other ascites: Secondary | ICD-10-CM

## 2022-05-20 LAB — CBC WITH DIFFERENTIAL/PLATELET
Basophils Absolute: 0.1 10*3/uL (ref 0.0–0.1)
Basophils Relative: 1.5 % (ref 0.0–3.0)
Eosinophils Absolute: 0.1 10*3/uL (ref 0.0–0.7)
Eosinophils Relative: 1 % (ref 0.0–5.0)
HCT: 40.2 % (ref 36.0–46.0)
Hemoglobin: 13.5 g/dL (ref 12.0–15.0)
Lymphocytes Relative: 39.4 % (ref 12.0–46.0)
Lymphs Abs: 2.6 10*3/uL (ref 0.7–4.0)
MCHC: 33.7 g/dL (ref 30.0–36.0)
MCV: 99.2 fl (ref 78.0–100.0)
Monocytes Absolute: 0.7 10*3/uL (ref 0.1–1.0)
Monocytes Relative: 11 % (ref 3.0–12.0)
Neutro Abs: 3.1 10*3/uL (ref 1.4–7.7)
Neutrophils Relative %: 47.1 % (ref 43.0–77.0)
Platelets: 81 10*3/uL — ABNORMAL LOW (ref 150.0–400.0)
RBC: 4.05 Mil/uL (ref 3.87–5.11)
RDW: 13.8 % (ref 11.5–15.5)
WBC: 6.6 10*3/uL (ref 4.0–10.5)

## 2022-05-20 LAB — COMPREHENSIVE METABOLIC PANEL
ALT: 29 U/L (ref 0–35)
AST: 45 U/L — ABNORMAL HIGH (ref 0–37)
Albumin: 3 g/dL — ABNORMAL LOW (ref 3.5–5.2)
Alkaline Phosphatase: 129 U/L — ABNORMAL HIGH (ref 39–117)
BUN: 17 mg/dL (ref 6–23)
CO2: 36 mEq/L — ABNORMAL HIGH (ref 19–32)
Calcium: 9.3 mg/dL (ref 8.4–10.5)
Chloride: 102 mEq/L (ref 96–112)
Creatinine, Ser: 0.96 mg/dL (ref 0.40–1.20)
GFR: 58.43 mL/min — ABNORMAL LOW (ref >60.00)
Glucose, Bld: 159 mg/dL — ABNORMAL HIGH (ref 70–99)
Potassium: 3.9 mEq/L (ref 3.5–5.1)
Sodium: 143 mEq/L (ref 135–145)
Total Bilirubin: 2.9 mg/dL — ABNORMAL HIGH (ref 0.2–1.2)
Total Protein: 5.6 g/dL — ABNORMAL LOW (ref 6.0–8.3)

## 2022-05-20 LAB — PROTIME-INR
INR: 1.3 ratio — ABNORMAL HIGH (ref 0.8–1.0)
Prothrombin Time: 14.3 s — ABNORMAL HIGH (ref 9.6–13.1)

## 2022-05-20 NOTE — Progress Notes (Signed)
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   She has worsening congestive heart failure, had increase in diuretics dose with improvement.  Initially she had worsening renal function, creatinine peaked at 2.0 has since improved to 1.46   Overall symptoms are stable from GI /liver standpoint but there are days when she has decreased energy and continues to have days where she sleeps more during daytime. She continues to have intermittent episodes of confusion, mood changes and changes in sleep pattern.  She tends to sleep during daytime as she has difficulty falling asleep at night.  According to patient she sleeps during the day because she is bored and there is nothing else to do   Denies any worsening confusion, abdominal distention, melena or rectal bleeding. No vomiting, abdominal bloating or weight gain.    GI/Liver Hx   She was evaluated by [facility]ologist and [facility] transplant hepatology, not a candidate for liver transplant please see full consult notes in [facility] for details   EGD 08/20/19: - Grade I and small (< 5 mm) esophageal varices. - No gross lesions in esophagus. - Portal hypertensive gastropathy. - Normal examined duodenum.   EGD 08/07/2018: - The Z-line was regular and was found 36 cm from the incisors. - No gross lesions were noted in the entire esophagus. No esophageal varices. - The stomach was normal. No gastric varices. - The examined duodenum was normal.   Colonoscopy 08/07/2018 - Four 4 to 9 mm polyps in the transverse colon and in the ascending colon, removed with a cold snare. Resected and  retrieved. - Diverticulosis in the sigmoid colon. - Non-bleeding internal hemorrhoids.   Relevant history: History of COVID-19 infection November 2020.  Worsening mental status with confusion and somnolence post Covid.   She has prominent splenorenal shunt, was referred to IR to discuss possible embolization.  But given she has significant improvement of symptoms with Xifaxan twice daily, family and patient preferred to hold off the embolization.     Outpatient Encounter Medications as of 05/20/2022  Medication Sig   albuterol (VENTOLIN HFA) 108 (90 Base) MCG/ACT inhaler Inhale 2 puffs into the lungs every 6 (six) hours as needed for wheezing or shortness of breath.   ALPRAZolam (XANAX) 0.25 MG tablet    atorvastatin (LIPITOR) 20 MG tablet Take 1 tablet (20 mg total) by mouth daily.   augmented betamethasone dipropionate (DIPROLENE-AF) 0.05 % cream Apply topically.   benazepril (LOTENSIN) 20 MG tablet Take 1 tablet (20 mg total) by mouth daily.   benzonatate (TESSALON PERLES) 100 MG capsule Take 1 capsule (100 mg total) by mouth 3 (three) times daily as needed.   Biotin 5000 MCG SUBL Place under the tongue.   blood glucose meter kit and supplies KIT Dispense based on patient and insurance preference. Use up to four times daily as directed. (FOR ICD-9 250.00, 250.01).   Blood Glucose Monitoring Suppl (ONETOUCH VERIO FLEX SYSTEM) w/Device KIT Check Bs up to 4 times daily Dx E11.9,   Cholecalciferol (VITAMIN D3) 5000 units CAPS Take 5,000 Units by mouth daily.   cyclobenzaprine (FLEXERIL) 5 MG tablet TAKE 1  TABLET BY MOUTH THREE TIMES DAILY AS NEEDED FOR MUSCLE SPASM (NEEDS  APPOINTMENT)   diltiazem (CARDIZEM CD) 120 MG 24 hr capsule Take 1 capsule by mouth once daily   fluticasone (FLONASE) 50 MCG/ACT nasal spray Place 2 sprays into both nostrils daily.   furosemide (LASIX) 40 MG tablet Take 60 mg once daily (one and a half tablets). For a weight of 250 and higher, take 80 mg in the morning  and 40 mg in the late afternoon   glucose blood (ONETOUCH VERIO) test strip CHECK GLUCOSE UP TO 4 TIMES DAILY Dx E11.69   lactulose (CHRONULAC) 10 GM/15ML solution TAKE 15 ML BY MOUTH  THREE TIMES DAILY AS NEEDED   meclizine (ANTIVERT) 25 MG tablet TAKE 1 TABLET BY MOUTH THREE TIMES DAILY AS NEEDED FOR DIZZINESS   ondansetron (ZOFRAN-ODT) 4 MG disintegrating tablet Take 1 tablet (4 mg total) by mouth every 6 (six) hours as needed for nausea or vomiting.   ONETOUCH DELICA LANCETS 43X MISC USE ONE LANCET TO CHECK GLUCOSE ONCE DAILY AND AS NEEDED   predniSONE (DELTASONE) 5 MG tablet Take 1 tablet (5 mg total) by mouth daily with breakfast.   rifAXIMin (XIFAXAN PO) Take 500 mg by mouth 2 (two) times daily.   spironolactone (ALDACTONE) 25 MG tablet Take 12.5 mg by mouth daily in the afternoon.   traMADol (ULTRAM) 50 MG tablet Take 1 tablet (50 mg total) by mouth every 12 (twelve) hours as needed for moderate pain.   No facility-administered encounter medications on file as of 05/20/2022.    Allergies as of 05/20/2022 - Review Complete 05/11/2022  Allergen Reaction Noted   Latex  03/27/2018    Past Medical History:  Diagnosis Date   Allergy    seasonal   Anxiety    Arthritis    hands, back    B12 deficiency 03/31/2021   Cataract    surgery to remove   Chronic back pain    lower back    Cirrhosis of liver without ascites ([place])    recent dx 07/11/18   Closed fracture of right tibial plateau 01/31/2018   Complex tear of medial meniscus of right knee 03/27/2018   COPD (chronic obstructive pulmonary disease) ([place])    no inhaler   Diabetes mellitus without complication (HCC)    DJD (degenerative joint disease)    Fibromyalgia    Genetic testing 03/08/2017   Ms. Gilpin underwent genetic counseling and testing for hereditary cancer syndromes on 02/21/2017. Her results were negative for mutations in all 83 genes analyzed by [facility]'s 83-gene Multi-Cancers Panel. 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 hearing aids   History of abnormal mammogram    History of arthroplasty of right knee 07/06/2018   Hyperlipidemia    Hypertension    Irregular heart beat    Neuromuscular disorder (HCC)    fibromyalgia   Sleep apnea    cpap some   Stress fracture of right tibia 03/27/2018   SVD (spontaneous vaginal delivery)    x 2   Tubular adenoma of colon 05/09/12   "innumerable number" of flat polyps    Past Surgical History:  Procedure Laterality Date   CARPAL TUNNEL RELEASE Left 10/20/14   [facility]   CATARACT EXTRACTION, BILATERAL  [facility]   COLONOSCOPY     IR RADIOLOGIST EVAL & MGMT  07/16/2019   KNEE ARTHROSCOPY WITH SUBCHONDROPLASTY Right 03/27/2018   Procedure: Right knee arthroscopic partial medial meniscus repair versus menisectomy with medial tibia subchondroplasty;  Surgeon: Nicholes Stairs, MD;  Location: [facility];  Service: Orthopedics;  Laterality: Right;  120 mins   LARYNX SURGERY     POLYP REMOVAL   MULTIPLE TOOTH EXTRACTIONS     POLYPECTOMY     vocal cord   TONSILLECTOMY  1965   TUBAL LIGATION     TUMOR REMOVAL     benign fatty tumors on abdomen   UPPER GASTROINTESTINAL ENDOSCOPY      Family History  Problem Relation Age of Onset   Heart failure Mother        d.37   Heart disease Mother    Prostate cancer Father        d.89 diagnosed in late-60s   Colon cancer Father        diagnosed at 40   Leukemia Maternal Aunt    Heart failure Maternal Uncle        d.50   Stomach cancer Paternal Aunt        questionable   Colon cancer Paternal Uncle        in his 3s   Heart failure Maternal Grandmother        d.45   Kidney cancer Cousin        d.78s maternal first-cousin   Colon polyps  Brother        polyposis. Underwent partial colectomy at age 68.   Spina bifida Brother        died at birth   Colon polyps Son 16   Heart attack Son 47   Other Other 37       multiple lipomas   Esophageal cancer Neg Hx    Rectal cancer Neg Hx    Breast cancer Neg Hx     Social History   Socioeconomic History   Marital status: Married    Spouse name: [name] "goes by [employer]"   Number of children: 3   Years of education: 11   Highest education level: GED or equivalent  Occupational History   Occupation: retired  Tobacco Use   Smoking status: Former    Packs/day: 1.00    Years: 3.00    Total pack years: 3.00    Types: Cigarettes    Quit date: 08/15/1969    Years since quitting: 52.7   Smokeless tobacco: Never  Vaping Use   Vaping Use: Never used  Substance and Sexual Activity   Alcohol use: No    Alcohol/week: 0.0 standard drinks of alcohol   Drug use: No   Sexual activity: Not Currently  Other Topics Concern   Not on file  Social History Narrative   Daughter lives with them and helps a lot   Social Determinants of Health   Financial Resource Strain: Low Risk  (02/10/2022)   Overall Financial Resource Strain (CARDIA)    Difficulty of Paying Living Expenses: Not hard at all  Food Insecurity: No Food Insecurity (02/10/2022)   Hunger Vital Sign    Worried About Running Out of Food in the Last Year: Never true    [place] in the Last Year: Never true  Transportation Needs: No Transportation Needs (02/10/2022)   PRAPARE - Hydrologist (Medical): No  Lack of Transportation (Non-Medical): No  Physical Activity: Inactive (02/10/2022)   Exercise Vital Sign    Days of Exercise per Week: 0 days    Minutes of Exercise per Session: 0 min  Stress: No Stress Concern Present (02/10/2022)   [place]    Feeling of Stress : Only a little  Social Connections: Socially Integrated  (02/10/2022)   Social Connection and Isolation Panel [NHANES]    Frequency of Communication with Friends and Family: More than three times a week    Frequency of Social Gatherings with Friends and Family: More than three times a week    Attends Religious Services: More than 4 times per year    Active Member of [employer] or Organizations: Yes    Attends Music therapist: More than 4 times per year    Marital Status: Married  Human resources officer Violence: Not At Risk (02/10/2022)   Humiliation, Afraid, Rape, and Kick questionnaire    Fear of Current or Ex-Partner: No    Emotionally Abused: No    Physically Abused: No    Sexually Abused: No      Review of systems: All other review of systems negative except as mentioned in the HPI.   Physical Exam: Vitals:   05/20/22 1122  BP: 118/76  Pulse: 71   Body mass index is 43.16 kg/m. Gen:      No acute distress HEENT:  sclera anicteric Abd:      soft, non-tender; no palpable masses, no distension Ext:    No edema Neuro: alert and oriented x 3 Psych: normal mood and affect  Data Reviewed:  Reviewed labs, radiology imaging, old records and pertinent past GI work up   Assessment and Plan/Recommendations:  76 year old very pleasant with history of Stacey Summers cirrhosis decompensated with hepatic encephalopathy Worsening renal function, CKD stage III  MELD 3.0: 16 at 11/15/2021  2:25 PM MELD-Na: 14 at 11/15/2021  2:25 PM     Fluid overload secondary to to [place] cirrhosis, and CHF: Diuretics managed by cardiology and nephrology, she also has chronic kidney disease  History of advanced adenomatous colon polyps Discussed with patient that given her age and comorbidities with cirrhosis, thrombocytopenia and coagulopathy, is at significant risk for complications associated with procedure and anesthesia.  Do not recommend ongoing surveillance colonoscopy due to her age and comorbidities   Chronic fatigue secondary to comorbid conditions  including cirrhosis Vitamin D and B12 level within normal range   Hepatocellular screening: No hepatic lesions concerning for [facility] on imaging January 2023.  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 Daily exercise as tolerated   Return in 6 months   The patient was provided an opportunity to ask questions and all were answered. The patient agreed with the plan and demonstrated an understanding of the instructions.  Damaris Hippo , MD    CC: Janora Norlander, DO

## 2022-05-20 NOTE — Patient Instructions (Signed)
Your provider has requested that you go to the basement level for lab work before leaving today. Press "B" on the elevator. The lab is located at the first door on the left as you exit the elevator.  Please follow up with Dr. Silverio Decamp in 6 months.   _______________________________________________________  If you are age 74 or older, your body mass index should be between 23-30. Your Body mass index is 43.16 kg/m. If this is out of the aforementioned range listed, please consider follow up with your Primary Care Provider.  If you are age 23 or younger, your body mass index should be between 19-25. Your Body mass index is 43.16 kg/m. If this is out of the aformentioned range listed, please consider follow up with your Primary Care Provider.   ________________________________________________________  The [facility] providers would like to encourage you to use [facility] to communicate with providers for non-urgent requests or questions.  Due to long hold times on the telephone, sending your provider a message by [facility] may be a faster and more efficient way to get a response.  Please allow 48 business hours for a response.  Please remember that this is for non-urgent requests.  _______________________________________________________  Due to recent changes in healthcare laws, you may see the results of your imaging and laboratory studies on MyChart before your provider has had a chance to review them.  We understand that in some cases there may be results that are confusing or concerning to you. Not all laboratory results come back in the same time frame and the provider may be waiting for multiple results in order to interpret others.  Please give [place] 48 hours in order for your provider to thoroughly review all the results before contacting the office for clarification of your results.

## 2022-05-21 ENCOUNTER — Other Ambulatory Visit: Payer: Self-pay | Admitting: Family Medicine

## 2022-05-21 DIAGNOSIS — R42 Dizziness and giddiness: Secondary | ICD-10-CM

## 2022-05-23 LAB — AFP TUMOR MARKER: AFP-Tumor Marker: 7.1 ng/mL — ABNORMAL HIGH

## 2022-05-26 ENCOUNTER — Other Ambulatory Visit: Payer: Self-pay

## 2022-05-26 DIAGNOSIS — K7460 Unspecified cirrhosis of liver: Secondary | ICD-10-CM

## 2022-05-26 DIAGNOSIS — K7581 Nonalcoholic steatohepatitis (NASH): Secondary | ICD-10-CM

## 2022-05-26 DIAGNOSIS — R772 Abnormality of alphafetoprotein: Secondary | ICD-10-CM

## 2022-06-01 ENCOUNTER — Inpatient Hospital Stay: Payer: PPO

## 2022-06-01 DIAGNOSIS — F411 Generalized anxiety disorder: Secondary | ICD-10-CM | POA: Diagnosis not present

## 2022-06-01 DIAGNOSIS — E11319 Type 2 diabetes mellitus with unspecified diabetic retinopathy without macular edema: Secondary | ICD-10-CM | POA: Diagnosis not present

## 2022-06-01 DIAGNOSIS — Z6841 Body Mass Index (BMI) 40.0 and over, adult: Secondary | ICD-10-CM | POA: Diagnosis not present

## 2022-06-01 DIAGNOSIS — D696 Thrombocytopenia, unspecified: Secondary | ICD-10-CM | POA: Diagnosis not present

## 2022-06-01 DIAGNOSIS — E662 Morbid (severe) obesity with alveolar hypoventilation: Secondary | ICD-10-CM | POA: Diagnosis not present

## 2022-06-08 ENCOUNTER — Inpatient Hospital Stay: Payer: PPO | Attending: Hematology

## 2022-06-08 VITALS — BP 152/60 | HR 62 | Temp 98.6°F | Resp 18

## 2022-06-08 DIAGNOSIS — E538 Deficiency of other specified B group vitamins: Secondary | ICD-10-CM | POA: Insufficient documentation

## 2022-06-08 MED ORDER — CYANOCOBALAMIN 1000 MCG/ML IJ SOLN
1000.0000 ug | Freq: Once | INTRAMUSCULAR | Status: AC
  Administered 2022-06-08: 1000 ug via INTRAMUSCULAR
  Filled 2022-06-08: qty 1

## 2022-06-08 NOTE — Progress Notes (Signed)
Stacey Summers presents today for injection per the provider's orders.  B12 injection administration without incident; injection site WNL; see MAR for injection details.  Patient tolerated procedure well and without incident.  No questions or complaints noted at this time.   Discharged from clinic via wheelchair in stable condition. Alert and oriented x 3. F/U with [facility] as scheduled.

## 2022-06-08 NOTE — Patient Instructions (Signed)
[facility]  Discharge Instructions: Thank you for choosing [facility] to provide your oncology and hematology care.  If you have a lab appointment with the [facility], please come in thru the Main Entrance and check in at the main information desk.  Wear comfortable clothing and clothing appropriate for easy access to any Portacath or PICC line.   We strive to give you quality time with your provider. You may need to reschedule your appointment if you arrive late (15 or more minutes).  Arriving late affects you and other patients whose appointments are after yours.  Also, if you miss three or more appointments without notifying the office, you may be dismissed from the clinic at the provider's discretion.      For prescription refill requests, have your pharmacy contact our office and allow 72 hours for refills to be completed.    Today you received B12 injection.   BELOW ARE SYMPTOMS THAT SHOULD BE REPORTED IMMEDIATELY: *FEVER GREATER THAN 100.4 F (38 C) OR HIGHER *CHILLS OR SWEATING *NAUSEA AND VOMITING THAT IS NOT CONTROLLED WITH YOUR NAUSEA MEDICATION *UNUSUAL SHORTNESS OF BREATH *UNUSUAL BRUISING OR BLEEDING *URINARY PROBLEMS (pain or burning when urinating, or frequent urination) *BOWEL PROBLEMS (unusual diarrhea, constipation, pain near the anus) TENDERNESS IN MOUTH AND THROAT WITH OR WITHOUT PRESENCE OF ULCERS (sore throat, sores in mouth, or a toothache) UNUSUAL RASH, SWELLING OR PAIN  UNUSUAL VAGINAL DISCHARGE OR ITCHING   Items with * indicate a potential emergency and should be followed up as soon as possible or go to the Emergency Department if any problems should occur.  Please show the CHEMOTHERAPY ALERT CARD or IMMUNOTHERAPY ALERT CARD at check-in to the Emergency Department and triage nurse.  Should you have questions after your visit or need to cancel or reschedule your appointment, please contact [facility] [phone]  and follow the prompts.  Office hours are 8:00 a.m. to 4:30 p.m. Monday - Friday. Please note that voicemails left after 4:00 p.m. may not be returned until the following business day.  We are closed weekends and major holidays. You have access to a nurse at all times for urgent questions. Please call the main number to the clinic [phone] and follow the prompts.  For any non-urgent questions, you may also contact your provider using MyChart. We now offer e-Visits for anyone 57 and older to request care online for non-urgent symptoms. For details visit [email or web address].   Also download the MyChart app! Go to the app store, search "MyChart", open the app, select [facility], and log in with your MyChart username and password.  Masks are optional in the cancer centers. If you would like for your care team to wear a mask while they are taking care of you, please let them know. You may have one support person who is at least 74 years old accompany you for your appointments.

## 2022-06-14 ENCOUNTER — Other Ambulatory Visit: Payer: Self-pay | Admitting: Gastroenterology

## 2022-06-23 ENCOUNTER — Telehealth: Payer: Self-pay

## 2022-06-23 ENCOUNTER — Encounter (HOSPITAL_COMMUNITY): Payer: Self-pay | Admitting: Hematology

## 2022-06-23 ENCOUNTER — Other Ambulatory Visit (HOSPITAL_COMMUNITY): Payer: Self-pay

## 2022-06-23 NOTE — Telephone Encounter (Signed)
Received renewal request for Xifaxan 550 mg patient assistance, called patient to renew and left a voicemail. Waiting for patient to reach out so I can ask her to bring in Out of Pocket Report from pharmacy.   Joneen Boers, [place] Patient Advocate Specialist [facility] Patient Advocate Team Direct Number: [phone] Fax: [phone]

## 2022-06-28 ENCOUNTER — Other Ambulatory Visit: Payer: PPO

## 2022-06-29 ENCOUNTER — Inpatient Hospital Stay: Payer: PPO

## 2022-06-29 ENCOUNTER — Ambulatory Visit
Admission: RE | Admit: 2022-06-29 | Discharge: 2022-06-29 | Disposition: A | Discharge status: Home / Self Care | Payer: PPO | Source: Ambulatory Visit | Attending: Gastroenterology | Admitting: Gastroenterology

## 2022-06-29 ENCOUNTER — Ambulatory Visit: Payer: PPO

## 2022-06-29 DIAGNOSIS — K7460 Unspecified cirrhosis of liver: Secondary | ICD-10-CM

## 2022-06-29 DIAGNOSIS — R772 Abnormality of alphafetoprotein: Secondary | ICD-10-CM

## 2022-06-29 DIAGNOSIS — K7581 Nonalcoholic steatohepatitis (NASH): Secondary | ICD-10-CM

## 2022-06-29 MED ORDER — GADOPICLENOL 0.5 MMOL/ML IV SOLN
10.0000 mL | Freq: Once | INTRAVENOUS | Status: AC | PRN
  Administered 2022-06-29: 10 mL via INTRAVENOUS

## 2022-07-06 ENCOUNTER — Inpatient Hospital Stay: Payer: PPO

## 2022-07-11 ENCOUNTER — Other Ambulatory Visit: Payer: Self-pay | Admitting: Gastroenterology

## 2022-07-11 ENCOUNTER — Inpatient Hospital Stay: Payer: PPO | Attending: Hematology

## 2022-07-11 ENCOUNTER — Other Ambulatory Visit: Payer: Self-pay | Admitting: General Practice

## 2022-07-11 VITALS — BP 136/72 | HR 48 | Temp 97.9°F | Resp 16

## 2022-07-11 DIAGNOSIS — E538 Deficiency of other specified B group vitamins: Secondary | ICD-10-CM | POA: Insufficient documentation

## 2022-07-11 DIAGNOSIS — Z79899 Other long term (current) drug therapy: Secondary | ICD-10-CM | POA: Diagnosis not present

## 2022-07-11 DIAGNOSIS — E1169 Type 2 diabetes mellitus with other specified complication: Secondary | ICD-10-CM

## 2022-07-11 MED ORDER — CYANOCOBALAMIN 1000 MCG/ML IJ SOLN
1000.0000 ug | Freq: Once | INTRAMUSCULAR | Status: AC
  Administered 2022-07-11: 1000 ug via INTRAMUSCULAR
  Filled 2022-07-11: qty 1

## 2022-07-11 NOTE — Patient Instructions (Signed)
[facility]  Discharge Instructions: Thank you for choosing [facility] to provide your oncology and hematology care.  If you have a lab appointment with the Cancer Center, please come in thru the Main Entrance and check in at the main information desk.  Wear comfortable clothing and clothing appropriate for easy access to any Portacath or PICC line.   We strive to give you quality time with your provider. You may need to reschedule your appointment if you arrive late (15 or more minutes).  Arriving late affects you and other patients whose appointments are after yours.  Also, if you miss three or more appointments without notifying the office, you may be dismissed from the clinic at the provider's discretion.      For prescription refill requests, have your pharmacy contact our office and allow 72 hours for refills to be completed.     To help prevent nausea and vomiting after your treatment, we encourage you to take your nausea medication as directed.  BELOW ARE SYMPTOMS THAT SHOULD BE REPORTED IMMEDIATELY: *FEVER GREATER THAN 100.4 F (38 C) OR HIGHER *CHILLS OR SWEATING *NAUSEA AND VOMITING THAT IS NOT CONTROLLED WITH YOUR NAUSEA MEDICATION *UNUSUAL SHORTNESS OF BREATH *UNUSUAL BRUISING OR BLEEDING *URINARY PROBLEMS (pain or burning when urinating, or frequent urination) *BOWEL PROBLEMS (unusual diarrhea, constipation, pain near the anus) TENDERNESS IN MOUTH AND THROAT WITH OR WITHOUT PRESENCE OF ULCERS (sore throat, sores in mouth, or a toothache) UNUSUAL RASH, SWELLING OR PAIN  UNUSUAL VAGINAL DISCHARGE OR ITCHING   Items with * indicate a potential emergency and should be followed up as soon as possible or go to the Emergency Department if any problems should occur.  Please show the CHEMOTHERAPY ALERT CARD or IMMUNOTHERAPY ALERT CARD at check-in to the Emergency Department and triage nurse.  Should you have questions after your visit or need to  cancel or reschedule your appointment, please contact [facility] [phone]  and follow the prompts.  Office hours are 8:00 a.m. to 4:30 p.m. Monday - Friday. Please note that voicemails left after 4:00 p.m. may not be returned until the following business day.  We are closed weekends and major holidays. You have access to a nurse at all times for urgent questions. Please call the main number to the clinic [phone] and follow the prompts.  For any non-urgent questions, you may also contact your provider using MyChart. We now offer e-Visits for anyone 18 and older to request care online for non-urgent symptoms. For details visit [email or web address].   Also download the MyChart app! Go to the app store, search "MyChart", open the app, select [facility], and log in with your MyChart username and password.  Masks are optional in the cancer centers. If you would like for your care team to wear a mask while they are taking care of you, please let them know. You may have one support person who is at least 74 years old accompany you for your appointments.  

## 2022-07-11 NOTE — Progress Notes (Signed)
Patient tolerated injection with no complaints voiced.  Site clean and dry with no bruising or swelling noted at site.  See MAR for details.  Band aid applied.  Patient stable during and after injection.  Vss with discharge and left in satisfactory condition with no s/s of distress noted.

## 2022-07-13 ENCOUNTER — Other Ambulatory Visit: Payer: Self-pay

## 2022-07-13 MED ORDER — LACTULOSE 10 GM/15ML PO SOLN: ORAL | 1 refills | Status: DC

## 2022-07-26 ENCOUNTER — Encounter: Payer: Self-pay | Admitting: Cardiovascular Disease

## 2022-07-26 ENCOUNTER — Ambulatory Visit: Payer: PPO | Attending: Cardiovascular Disease | Admitting: Cardiovascular Disease

## 2022-07-26 VITALS — BP 132/70 | HR 83 | Ht 61.0 in | Wt 218.6 lb

## 2022-07-26 DIAGNOSIS — G4733 Obstructive sleep apnea (adult) (pediatric): Secondary | ICD-10-CM

## 2022-07-26 DIAGNOSIS — Z87448 Personal history of other diseases of urinary system: Secondary | ICD-10-CM

## 2022-07-26 DIAGNOSIS — I10 Essential (primary) hypertension: Secondary | ICD-10-CM

## 2022-07-26 DIAGNOSIS — I5032 Chronic diastolic (congestive) heart failure: Secondary | ICD-10-CM

## 2022-07-26 DIAGNOSIS — E7800 Pure hypercholesterolemia, unspecified: Secondary | ICD-10-CM

## 2022-07-26 DIAGNOSIS — Z862 Personal history of diseases of the blood and blood-forming organs and certain disorders involving the immune mechanism: Secondary | ICD-10-CM | POA: Diagnosis not present

## 2022-07-26 DIAGNOSIS — R002 Palpitations: Secondary | ICD-10-CM | POA: Diagnosis not present

## 2022-07-26 NOTE — Patient Instructions (Signed)
Medication Instructions:  NO CHANGES  *If you need a refill on your cardiac medications before your next appointment, please call your pharmacy*   Lab Work: NON-FASTING labs in 6-8 weeks Jan 23 - Feb 6  If you have labs (blood work) drawn today and your tests are completely normal, you will receive your results only by: [place] (if you have MyChart) OR A paper copy in the mail If you have any lab test that is abnormal or we need to change your treatment, we will call you to review the results.   Follow-Up: At [facility], you and your health needs are our priority.  As part of our continuing mission to provide you with exceptional heart care, we have created designated Provider Care Teams.  These Care Teams include your primary Cardiologist (physician) and Advanced Practice Providers (APPs -  Physician Assistants and Nurse Practitioners) who all work together to provide you with the care you need, when you need it.  We recommend signing up for the patient portal called "MyChart".  Sign up information is provided on this After Visit Summary.  MyChart is used to connect with patients for Virtual Visits (Telemedicine).  Patients are able to view lab/test results, encounter notes, upcoming appointments, etc.  Non-urgent messages can be sent to your provider as well.   To learn more about what you can do with MyChart, go to [email or web address].    Your next appointment:    6 months with Dr. Sallyanne Kuster  ** call in February for June appointment

## 2022-07-26 NOTE — Progress Notes (Signed)
Cardiology office note   Date:  07/26/2022   ID:  ASLIN FARINAS, DOB 19-Mar-1946, MRN [id number]   PCP:  Janora Norlander, DO  Cardiologist:  Sanda Klein, MD  Electrophysiologist:  None   Evaluation Performed:  Follow-Up Visit  Chief Complaint: edema  History of Present Illness:    Stacey Summers is a 76 y.o. female with Morbid obesity, obstructive sleep apnea, essential hypertension, hyperlipidemia and chronic leg edema.  She has cirrhosis due to NASH.  She has a history of palpitations that responded reasonably well to treatment with diltiazem.  In the office today her rhythm was trigeminal, but she was unaware of palpitations.  Since increasing her diuretics and her last appointment she has had a marked reduction in the degree of edema.  Abdominal distention is also better.  Her weight is down by almost 40 pounds.  She no longer has weeping from the legs or any blisters.  Her breathing has also improved.  She did not require a wheelchair at this time to get from the waiting room to the exam room.  She does not have orthopnea or PND, she remains very sedentary.  She has occasional muscle cramps, but these are no worse since restarting the diuretics.  She is actually very close now to what we had previously estimated "dry weight" of 220 pounds.  Her nephrologist is Dr. Theador Hawthorne in [place].  Home scale usually shows about 2 pounds less than our office scale.  She did not bring her weight log with her today.  She underwent an echocardiogram on January 18, 2022 that showed evidence of elevated left heart filling pressures (grade 2 diastolic dysfunction).  Spotty compliance with CPAP and similar has spotty compliance with compression stockings and Velcro wraps for her lymphedema.   She is concerned about possible "blood clots" although I am not sure why this is specifically her concern.  Her mother died suddenly without clear explanation by postmortem study at age 48.  Her  brother, who had multiple health problems diabetes sleep at age 65 not long ago.  She thinks maybe they had clots.   Past Medical History:  Diagnosis Date   Allergy    seasonal   Anxiety    Arthritis    hands, back    B12 deficiency 03/31/2021   Cataract    surgery to remove   Chronic back pain    lower back    Cirrhosis of liver without ascites ([place])    recent dx 07/11/18   Closed fracture of right tibial plateau 01/31/2018   Complex tear of medial meniscus of right knee 03/27/2018   COPD (chronic obstructive pulmonary disease) ([place])    no inhaler   Diabetes mellitus without complication (HCC)    DJD (degenerative joint disease)    Fibromyalgia    Genetic testing 03/08/2017   Ms. Donatelli underwent genetic counseling and testing for hereditary cancer syndromes on 02/21/2017. Her results were negative for mutations in all 83 genes analyzed by [facility]'s 83-gene Multi-Cancers Panel. 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   History of abnormal mammogram    History of arthroplasty of right knee 07/06/2018   Hyperlipidemia    Hypertension    Irregular heart beat    Neuromuscular disorder (  [place])    fibromyalgia   Sleep apnea    cpap some   Stress fracture of right tibia 03/27/2018   SVD (spontaneous vaginal delivery)    x 2   Tubular adenoma of colon 05/09/12   "innumerable number" of flat polyps   Past Surgical History:  Procedure Laterality Date   CARPAL TUNNEL RELEASE Left 10/20/14   [facility]   CATARACT EXTRACTION, BILATERAL     CESAREAN SECTION  1977   CHOLECYSTECTOMY  1990   COLONOSCOPY     IR RADIOLOGIST EVAL & MGMT  07/16/2019   KNEE ARTHROSCOPY WITH SUBCHONDROPLASTY Right 03/27/2018   Procedure: Right knee arthroscopic partial medial  meniscus repair versus menisectomy with medial tibia subchondroplasty;  Surgeon: Nicholes Stairs, MD;  Location: [facility];  Service: Orthopedics;  Laterality: Right;  120 mins   LARYNX SURGERY     POLYP REMOVAL   MULTIPLE TOOTH EXTRACTIONS     POLYPECTOMY     vocal cord   TONSILLECTOMY  1965   TUBAL LIGATION     TUMOR REMOVAL     benign fatty tumors on abdomen   UPPER GASTROINTESTINAL ENDOSCOPY       Current Meds  Medication Sig   albuterol (VENTOLIN HFA) 108 (90 Base) MCG/ACT inhaler Inhale 2 puffs into the lungs every 6 (six) hours as needed for wheezing or shortness of breath.   ALPRAZolam (XANAX) 0.25 MG tablet    atorvastatin (LIPITOR) 20 MG tablet Take 1 tablet by mouth once daily   augmented betamethasone dipropionate (DIPROLENE-AF) 0.05 % cream Apply topically.   benazepril (LOTENSIN) 20 MG tablet Take 1 tablet (20 mg total) by mouth daily.   benzonatate (TESSALON PERLES) 100 MG capsule Take 1 capsule (100 mg total) by mouth 3 (three) times daily as needed.   Biotin 5000 MCG SUBL Place under the tongue.   blood glucose meter kit and supplies KIT Dispense based on patient and insurance preference. Use up to four times daily as directed. (FOR ICD-9 250.00, 250.01).   Blood Glucose Monitoring Suppl (ONETOUCH VERIO FLEX SYSTEM) w/Device KIT Check Bs up to 4 times daily Dx E11.9,   Cholecalciferol (VITAMIN D3) 5000 units CAPS Take 5,000 Units by mouth daily.   cyclobenzaprine (FLEXERIL) 5 MG tablet TAKE 1 TABLET BY MOUTH THREE TIMES DAILY AS NEEDED FOR MUSCLE SPASM (NEEDS  APPOINTMENT)   diltiazem (CARDIZEM CD) 120 MG 24 hr capsule Take 1 capsule by mouth once daily   fluticasone (FLONASE) 50 MCG/ACT nasal spray Place 2 sprays into both nostrils daily.   furosemide (LASIX) 40 MG tablet Take 60 mg once daily (one and a half tablets). For a weight of 250 and higher, take 80 mg in the morning and 40 mg in the late afternoon   glucose blood (ONETOUCH VERIO) test strip CHECK GLUCOSE UP  TO 4 TIMES DAILY Dx E11.69   lactulose (CHRONULAC) 10 GM/15ML solution TAKE 15 ML BY MOUTH  THREE TIMES DAILY AS NEEDED   meclizine (ANTIVERT) 25 MG tablet TAKE 1 TABLET BY MOUTH THREE TIMES DAILY AS NEEDED FOR DIZZINESS   ondansetron (ZOFRAN-ODT) 4 MG disintegrating tablet Take 1 tablet (4 mg total) by mouth every 6 (six) hours as needed for nausea or vomiting.   ONETOUCH DELICA LANCETS 76B MISC USE ONE LANCET TO CHECK GLUCOSE ONCE DAILY AND AS NEEDED   predniSONE (DELTASONE) 5 MG tablet Take 1 tablet (5 mg total) by mouth daily with breakfast.   rifAXIMin (XIFAXAN PO) Take 500 mg by mouth  2 (two) times daily.   spironolactone (ALDACTONE) 25 MG tablet Take 12.5 mg by mouth daily in the afternoon.   traMADol (ULTRAM) 50 MG tablet Take 1 tablet (50 mg total) by mouth every 12 (twelve) hours as needed for moderate pain.     Allergies:   Latex   Social History   Tobacco Use   Smoking status: Former    Packs/day: 1.00    Years: 3.00    Total pack years: 3.00    Types: Cigarettes    Quit date: 08/15/1969    Years since quitting: 52.9   Smokeless tobacco: Never  Vaping Use   Vaping Use: Never used  Substance Use Topics   Alcohol use: No    Alcohol/week: 0.0 standard drinks of alcohol   Drug use: No     Family Hx: The patient's family history includes Colon cancer in her father and paternal uncle; Colon polyps in her brother; Colon polyps (age of onset: 48) in her son; Heart attack (age of onset: 22) in her son; Heart disease in her mother; Heart failure in her maternal grandmother, maternal uncle, and mother; Kidney cancer in her cousin; Leukemia in her maternal aunt; Other (age of onset: 53) in an other family member; Prostate cancer in her father; Spina bifida in her brother; Stomach cancer in her paternal aunt. There is no history of Esophageal cancer, Rectal cancer, or Breast cancer.  ROS:   Please see the history of present illness.    All other systems are reviewed and are  negative. Prior CV studies:   The following studies were reviewed today:  Echocardiogram 01/18/2022      ECHOCARDIOGRAM REPORT           1. Left ventricular ejection fraction, by estimation, is 60 to 65%. The  left ventricle has normal function. The left ventricle has no regional  wall motion abnormalities. Left ventricular diastolic parameters are  consistent with Grade II diastolic  dysfunction (pseudonormalization). Elevated left ventricular end-diastolic  pressure.   2. Right ventricular systolic function is normal. The right ventricular  size is normal. There is normal pulmonary artery systolic pressure.   3. Left atrial size was moderately dilated.   4. The mitral valve is abnormal. Trivial mitral valve regurgitation. No  evidence of mitral stenosis.   5. Mean gradient across valve 13 mmHg calculated AVA 2.5 cm2 . The aortic  valve is tricuspid. There is moderate calcification of the aortic valve.  There is moderate thickening of the aortic valve. Aortic valve  regurgitation is not visualized. Aortic  valve sclerosis/calcification is present, without any evidence of aortic  stenosis.   6. The inferior vena cava is normal in size with greater than 50%  respiratory variability, suggesting right atrial pressure of 3 mmHg.    Labs/Other Tests and Data Reviewed:    EKG: ECG is ordered today.  Shows sinus rhythm with a single ventricular couplet, poor R wave progression, no ischemic repolarization changes, QTc 453 ms   01/21/2022: BNP 208.1 05/20/2022: ALT 29; BUN 17; Creatinine, Ser 0.96; Hemoglobin 13.5; Platelets 81.0; Potassium 3.9; Sodium 143   04/07/2022 Creatinine 1.06, sodium 146, potassium 3.7, albumin 3.0, AST 59, ALT 42, hemoglobin 13.4, platelets 66 K Creatinine clearance by 24 hour collection was 63 mL/minute  Recent Labs:   Recent Lipid Panel Lipid Panel     Component Value Date/Time   CHOL 103 06/28/2021 1106   CHOL 162 12/20/2012 1044   TRIG 55  06/28/2021 1106  TRIG 91 12/25/2014 1020   TRIG 83 12/20/2012 1044   HDL 48 06/28/2021 1106   HDL 42 12/25/2014 1020   HDL 42 12/20/2012 1044   CHOLHDL 2.1 06/28/2021 1106   LDLCALC 42 06/28/2021 1106   LDLCALC 59 01/29/2014 1031   LDLCALC 103 (H) 12/20/2012 1044   LABVLDL 13 06/28/2021 1106    Wt Readings from Last 3 Encounters:  07/26/22 99.2 kg  05/20/22 107 kg  04/21/22 117.7 kg     Objective:    Vital Signs:  BP 132/70   Pulse 83   Ht _0  (1.549 m)   Wt 99.2 kg   SpO2 97%   BMI 41.30 kg/m      General: Alert, oriented x3, no distress, morbidly obese Head: no evidence of trauma, PERRL, EOMI, no exophtalmos or lid lag, no myxedema, no xanthelasma; normal ears, nose and oropharynx Neck: normal jugular venous pulsations and no hepatojugular reflux; brisk carotid pulses without delay and no carotid bruits Chest: clear to auscultation, no signs of consolidation by percussion or palpation, normal fremitus, symmetrical and full respiratory excursions Cardiovascular: normal position and quality of the apical impulse, regular rhythm, normal first and second heart sounds, no murmurs, rubs or gallops Abdomen: no tenderness or distention, no masses by palpation, no abnormal pulsatility or arterial bruits, normal bowel sounds, no hepatosplenomegaly Extremities: 3+ symmetrical soft pitting pretibial edema ; 2+ radial, ulnar and brachial pulses bilaterally; 2+ right femoral, posterior tibial and dorsalis pedis pulses; 2+ left femoral, posterior tibial and dorsalis pedis pulses; no subclavian or femoral bruits Neurological: grossly nonfocal except extremely hard of hearing Psych: Normal mood and affect     ASSESSMENT & PLAN:    CHF: Edema is multifactorial, but there is clear improvement of volume with resolution of the signs and symptoms of right and left heart failure. Additional reasons for her edema  are cirrhosis/hypoalbuminemia, inconsistent treatment of sleep apnea and  local problems with lymphedema are contributory continues to be a problem.  Continue furosemide 60 mg once daily and increase spironolactone to 25 mg daily, recheck labs in 4-8 weeks.  Normal LV systolic function.  Echocardiogram did show evidence of diastolic dysfunction related and increased left heart pressures when she was clearly hypervolemic. AKI: Completely resolved.  24-hour urine collection showed creatinine clearance of 63 mL/min.  Most recent creatinine was 0.9.  Recheck labs after the increased dose of spironolactone in a few weeks. OSA: CPAP whenever she sleeps, be a day or night.  Recommended 100% compliance with Palpitations: Currently not bothering her much.  They appeared to be related to atrial ectopy. PVCs on ECG today, asymptomatic. HTN: Fair control.  With the higher dose of spironolactone should be well within target range of 130/80 or less. Cirrhosis: Abdominal distention (presumably due to ascites) has improved) cirrhosis presumed to be secondary to NASH.  Has thrombocytopenia suggesting that she does have portal hypertension and splenomegaly.  Her most recent labs do show some parenchymal insufficiency with low albumin level, elevated INR and bilirubin.   HLP: All lipid parameters were acceptable range on last check. Severe obesity: Likely the major underlying cause for her dyslipidemia, hypertension, sleep apnea and NASH. History of anemia: Resolved.  Most recent hemoglobin 13.5 and most recent iron stores normal.  Elevated MCV.   Patient Instructions  Medication Instructions:  NO CHANGES  *If you need a refill on your cardiac medications before your next appointment, please call your pharmacy*   Lab Work: NON-FASTING labs in 6-8 weeks  Jan 23 - Feb 6  If you have labs (blood work) drawn today and your tests are completely normal, you will receive your results only by: [place] (if you have MyChart) OR A paper copy in the mail If you have any lab test that is  abnormal or we need to change your treatment, we will call you to review the results.   Follow-Up: At [facility], you and your health needs are our priority.  As part of our continuing mission to provide you with exceptional heart care, we have created designated Provider Care Teams.  These Care Teams include your primary Cardiologist (physician) and Advanced Practice Providers (APPs -  Physician Assistants and Nurse Practitioners) who all work together to provide you with the care you need, when you need it.  We recommend signing up for the patient portal called "MyChart".  Sign up information is provided on this After Visit Summary.  MyChart is used to connect with patients for Virtual Visits (Telemedicine).  Patients are able to view lab/test results, encounter notes, upcoming appointments, etc.  Non-urgent messages can be sent to your provider as well.   To learn more about what you can do with MyChart, go to [email or web address].    Your next appointment:    6 months with Dr. Sallyanne Kuster  ** call in February for June appointment          Signed, Sanda Klein, MD  07/26/2022 1:11 PM    [place]

## 2022-07-27 ENCOUNTER — Ambulatory Visit: Payer: PPO

## 2022-07-27 ENCOUNTER — Inpatient Hospital Stay: Payer: PPO

## 2022-08-03 ENCOUNTER — Inpatient Hospital Stay: Payer: PPO | Attending: Hematology

## 2022-08-03 VITALS — BP 133/56 | HR 57 | Temp 97.2°F | Resp 18

## 2022-08-03 DIAGNOSIS — E538 Deficiency of other specified B group vitamins: Secondary | ICD-10-CM | POA: Insufficient documentation

## 2022-08-03 MED ORDER — CYANOCOBALAMIN 1000 MCG/ML IJ SOLN
1000.0000 ug | Freq: Once | INTRAMUSCULAR | Status: AC
  Administered 2022-08-03: 1000 ug via INTRAMUSCULAR
  Filled 2022-08-03: qty 1

## 2022-08-03 NOTE — Patient Instructions (Signed)
[facility]  Discharge Instructions: Thank you for choosing [facility] to provide your oncology and hematology care.  If you have a lab appointment with the [facility], please come in thru the Main Entrance and check in at the main information desk.  Wear comfortable clothing and clothing appropriate for easy access to any Portacath or PICC line.   We strive to give you quality time with your provider. You may need to reschedule your appointment if you arrive late (15 or more minutes).  Arriving late affects you and other patients whose appointments are after yours.  Also, if you miss three or more appointments without notifying the office, you may be dismissed from the clinic at the provider's discretion.      For prescription refill requests, have your pharmacy contact our office and allow 72 hours for refills to be completed.    Today you received the following chemotherapy and/or immunotherapy agents B12      To help prevent nausea and vomiting after your treatment, we encourage you to take your nausea medication as directed.  BELOW ARE SYMPTOMS THAT SHOULD BE REPORTED IMMEDIATELY: *FEVER GREATER THAN 100.4 F (38 C) OR HIGHER *CHILLS OR SWEATING *NAUSEA AND VOMITING THAT IS NOT CONTROLLED WITH YOUR NAUSEA MEDICATION *UNUSUAL SHORTNESS OF BREATH *UNUSUAL BRUISING OR BLEEDING *URINARY PROBLEMS (pain or burning when urinating, or frequent urination) *BOWEL PROBLEMS (unusual diarrhea, constipation, pain near the anus) TENDERNESS IN MOUTH AND THROAT WITH OR WITHOUT PRESENCE OF ULCERS (sore throat, sores in mouth, or a toothache) UNUSUAL RASH, SWELLING OR PAIN  UNUSUAL VAGINAL DISCHARGE OR ITCHING   Items with * indicate a potential emergency and should be followed up as soon as possible or go to the Emergency Department if any problems should occur.  Please show the CHEMOTHERAPY ALERT CARD or IMMUNOTHERAPY ALERT CARD at check-in to the Emergency  Department and triage nurse.  Should you have questions after your visit or need to cancel or reschedule your appointment, please contact [facility] [phone]  and follow the prompts.  Office hours are 8:00 a.m. to 4:30 p.m. Monday - Friday. Please note that voicemails left after 4:00 p.m. may not be returned until the following business day.  We are closed weekends and major holidays. You have access to a nurse at all times for urgent questions. Please call the main number to the clinic [phone] and follow the prompts.  For any non-urgent questions, you may also contact your provider using MyChart. We now offer e-Visits for anyone 11 and older to request care online for non-urgent symptoms. For details visit [email or web address].   Also download the MyChart app! Go to the app store, search "MyChart", open the app, select [facility], and log in with your MyChart username and password.  Masks are optional in the cancer centers. If you would like for your care team to wear a mask while they are taking care of you, please let them know. You may have one support person who is at least 74 years old accompany you for your appointments.

## 2022-08-24 ENCOUNTER — Inpatient Hospital Stay: Payer: PPO

## 2022-08-24 ENCOUNTER — Telehealth: Payer: Self-pay | Admitting: Cardiovascular Disease

## 2022-08-24 ENCOUNTER — Inpatient Hospital Stay: Payer: PPO | Attending: Hematology

## 2022-08-24 ENCOUNTER — Inpatient Hospital Stay: Payer: PPO | Admitting: Physician Assistant

## 2022-08-24 DIAGNOSIS — E611 Iron deficiency: Secondary | ICD-10-CM

## 2022-08-24 DIAGNOSIS — R911 Solitary pulmonary nodule: Secondary | ICD-10-CM

## 2022-08-24 DIAGNOSIS — R600 Localized edema: Secondary | ICD-10-CM

## 2022-08-24 DIAGNOSIS — R918 Other nonspecific abnormal finding of lung field: Secondary | ICD-10-CM | POA: Insufficient documentation

## 2022-08-24 DIAGNOSIS — Z79899 Other long term (current) drug therapy: Secondary | ICD-10-CM

## 2022-08-24 DIAGNOSIS — D696 Thrombocytopenia, unspecified: Secondary | ICD-10-CM | POA: Diagnosis not present

## 2022-08-24 DIAGNOSIS — E538 Deficiency of other specified B group vitamins: Secondary | ICD-10-CM

## 2022-08-24 LAB — CBC WITH DIFFERENTIAL/PLATELET
Abs Immature Granulocytes: 0.01 10*3/uL (ref 0.00–0.07)
Basophils Absolute: 0 10*3/uL (ref 0.0–0.1)
Basophils Relative: 1 %
Eosinophils Absolute: 0.1 10*3/uL (ref 0.0–0.5)
Eosinophils Relative: 1 %
HCT: 38.2 % (ref 36.0–46.0)
Hemoglobin: 12.8 g/dL (ref 12.0–15.0)
Immature Granulocytes: 0 %
Lymphocytes Relative: 33 %
Lymphs Abs: 2 10*3/uL (ref 0.7–4.0)
MCH: 33.8 pg (ref 26.0–34.0)
MCHC: 33.5 g/dL (ref 30.0–36.0)
MCV: 100.8 fL — ABNORMAL HIGH (ref 80.0–100.0)
Monocytes Absolute: 0.6 10*3/uL (ref 0.1–1.0)
Monocytes Relative: 10 %
Neutro Abs: 3.4 10*3/uL (ref 1.7–7.7)
Neutrophils Relative %: 55 %
Platelets: 83 10*3/uL — ABNORMAL LOW (ref 150–400)
RBC: 3.79 MIL/uL — ABNORMAL LOW (ref 3.87–5.11)
RDW: 13.6 % (ref 11.5–15.5)
WBC: 6 10*3/uL (ref 4.0–10.5)
nRBC: 0 % (ref 0.0–0.2)

## 2022-08-24 LAB — COMPREHENSIVE METABOLIC PANEL
ALT: 32 U/L (ref 0–44)
AST: 49 U/L — ABNORMAL HIGH (ref 15–41)
Albumin: 2.9 g/dL — ABNORMAL LOW (ref 3.5–5.0)
Alkaline Phosphatase: 71 U/L (ref 38–126)
Anion gap: 6 (ref 5–15)
BUN: 34 mg/dL — ABNORMAL HIGH (ref 8–23)
CO2: 26 mmol/L (ref 22–32)
Calcium: 8.8 mg/dL — ABNORMAL LOW (ref 8.9–10.3)
Chloride: 105 mmol/L (ref 98–111)
Creatinine, Ser: 1.21 mg/dL — ABNORMAL HIGH (ref 0.44–1.00)
GFR, Estimated: 47 mL/min — ABNORMAL LOW (ref >60)
Glucose, Bld: 149 mg/dL — ABNORMAL HIGH (ref 70–99)
Potassium: 4.2 mmol/L (ref 3.5–5.1)
Sodium: 137 mmol/L (ref 135–145)
Total Bilirubin: 3.5 mg/dL — ABNORMAL HIGH (ref 0.3–1.2)
Total Protein: 5.7 g/dL — ABNORMAL LOW (ref 6.5–8.1)

## 2022-08-24 LAB — VITAMIN B12: Vitamin B-12: 958 pg/mL — ABNORMAL HIGH (ref 180–914)

## 2022-08-24 LAB — IRON AND TIBC
Iron: 189 ug/dL — ABNORMAL HIGH (ref 28–170)
Saturation Ratios: 60 % — ABNORMAL HIGH (ref 10.4–31.8)
TIBC: 315 ug/dL (ref 250–450)
UIBC: 126 ug/dL

## 2022-08-24 LAB — FERRITIN: Ferritin: 107 ng/mL (ref 11–307)

## 2022-08-24 NOTE — Telephone Encounter (Signed)
*  STAT* If patient is at the pharmacy, call can be transferred to refill team.   1. Which medications need to be refilled? (please list name of each medication and dose if known)    spironolactone (ALDACTONE) 25 MG tablet    2. Which pharmacy/location (including street and city if local pharmacy) is medication to be sent to?    [facility] [place], [place] - [address]    3. Do they need a 30 day or 90 day supply? 90   Pt is out

## 2022-08-26 MED ORDER — SPIRONOLACTONE 25 MG PO TABS: 12.5000 mg | ORAL_TABLET | Freq: Every day | ORAL | 11 refills | Status: DC

## 2022-08-29 LAB — METHYLMALONIC ACID, SERUM: Methylmalonic Acid, Quantitative: 361 nmol/L (ref 0–378)

## 2022-08-30 NOTE — Telephone Encounter (Signed)
*  STAT* If patient is at the pharmacy, call can be transferred to refill team.   1. Which medications need to be refilled? (please list name of each medication and dose if known)  spironolactone (ALDACTONE) 25 MG tablet  2. Which pharmacy/location (including street and city if local pharmacy) is medication to be sent to? [place], [address]  3. Do they need a 30 day or 90 day supply?  90 day supply  Patient's daughter states patient is almost out of medication. She states Dr. Sallyanne Kuster increased the patient from 1/2 tablet to a whole tablet. She states the pharmacy will need new instructions in order to fill the prescription for the correct amount.

## 2022-08-30 NOTE — Telephone Encounter (Addendum)
Spoke with patient who passed the phone to her daughter Stacey Summers (permission by patient to speak with daughter). [name] stated patient is to be on spironolactone '25mg'$  daily. Explained the prescription needed to be verified with Dr. Sallyanne Kuster, then we will call her back.

## 2022-08-31 ENCOUNTER — Other Ambulatory Visit: Payer: Self-pay

## 2022-08-31 ENCOUNTER — Telehealth: Payer: Self-pay | Admitting: Cardiovascular Disease

## 2022-08-31 ENCOUNTER — Other Ambulatory Visit: Payer: Self-pay | Admitting: Gastroenterology

## 2022-08-31 ENCOUNTER — Inpatient Hospital Stay: Payer: PPO | Admitting: Physician Assistant

## 2022-08-31 ENCOUNTER — Telehealth: Payer: Self-pay

## 2022-08-31 ENCOUNTER — Inpatient Hospital Stay: Payer: PPO

## 2022-08-31 MED ORDER — SPIRONOLACTONE 25 MG PO TABS: 25.0000 mg | ORAL_TABLET | Freq: Every day | ORAL | 3 refills | Status: DC

## 2022-08-31 MED ORDER — FUROSEMIDE 40 MG PO TABS: ORAL_TABLET | ORAL | 1 refills | Status: DC

## 2022-08-31 NOTE — Telephone Encounter (Signed)
Spoke to patient Dr.Croitoru advised to increase Spironolactone to 25 mg daily.Prescription sent to your pharmacy.

## 2022-08-31 NOTE — Telephone Encounter (Signed)
Patient requested Lasix refill.Refill sent to pharmacy.

## 2022-08-31 NOTE — Progress Notes (Signed)
Stacey Summers, Stacey [place] [id number]   CLINIC:  Medical Oncology/Hematology  PCP:  Janora Norlander, DO [place] [id number] [phone]   REASON FOR VISIT:  Follow-up for thrombocytopenia   PRIOR THERAPY: None   CURRENT THERAPY: Monthly B12 injections  INTERVAL HISTORY:   [name] 77 y.o. female returns for routine follow-up of her thrombocytopenia, B12 deficiency, and lung nodules.  She was last seen by Tarri Abernethy PA-C on 02/16/2022.   At today's visit, she reports feeling about the same.  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   She denies noticing any bleeding events such as hematemesis, hematochezia, epistaxis, melena, or hematuria.  She does have some mild easy bruising on her extremities, but denies any petechial rash. No B symptoms such as fever, chills, night sweats, unintentional weight loss.  She continues to have chronic fatigue related to her multiple medical comorbidities.  She has been taking iron tablet daily for the past 6 months which she is tolerating well.  She has 50% energy and 100% appetite. She endorses that she is maintaining a stable weight.  ASSESSMENT & PLAN:  1.  Thrombocytopenia: - Platelet count is ranging anywhere between 70-140.  Most recent platelets (08/24/2022) at 83. - CT of the AP on 06/27/2018 showed spleen is mildly enlarged.  She has cirrhosis of the liver. - MRI abdomen (06/29/2022): Spleen normal in size without significant abnormality - EGD on 08/20/2019 showed grade 1 varices, but patient denies any melena or bright red blood per rectum  - No signs or symptoms of bleeding at this time.  Admits to easy bruising but denies petechial rash. - Differential diagnosis favors thrombocytopenia secondary to splenomegaly and cirrhosis. - PLAN: No treatment  of thrombocytopenia needed at this time.  RTC for labs and follow-up visit in 6 months.     2.  Iron deficiency (without anemia) - Iron panel (07/28/2021): Ferritin 35, iron saturation 44% - Patient denies any signs of GI blood loss - Most recent EGD (08/20/2019) showed small esophageal varices and mild portal hypertensive gastropathy - She has been taking iron tablets since December 2022, which she is tolerating well  - Most recent labs (08/24/2022): Hgb 12.8/MCV 100.8, ferritin 107, iron saturation 60%. - PLAN: Continue ferrous sulfate 325 mg EVERY OTHER DAY.  We will recheck iron panel at follow-up visit in 6 months, and consider parenteral IV iron if no improvement.  3.  B12 deficiency - B12 deficiency evidenced by labs (03/26/2021) showing elevated methylmalonic acid 629, although B12 was normal at 336 - Patient has been receiving monthly B12 injections - Most recent labs (08/24/2022): Vitamin B12 958, methylmalonic acid normalized at 361 - PLAN: Continue monthly B12 injections   4.  Lung nodules: - She is a former smoker and has not smoked cigarettes since 1971. - CT of the chest from 10/04/2019 which showed resolution of recent COVID-19 pneumonia.  Bilateral pulmonary nodules, less than 4 mm in size have been stable compared to previous CT chest 06/28/2019 -Repeat CT chest (07/28/2021): Bilateral small subpleural nodules are stable and benign, largest nodule in the peripheral left lower lobe measures 3.5 mm, unchanged. - PLAN: No further follow-up recommended per radiology report  PLAN SUMMARY: >> Monthly B12 injections >> Labs (CBC/D, CMP, B12, MMA, ferritin, iron/TIBC) in 6 months >> RTC in 6 months (1 week after labs)  for OFFICE visit    REVIEW OF SYSTEMS:   Review of Systems  Constitutional:  Positive for fatigue. Negative for appetite change, chills, diaphoresis, fever and unexpected weight change.  HENT:   Positive for trouble swallowing. Negative for lump/mass and nosebleeds.    Eyes:  Negative for eye problems.  Respiratory:  Positive for shortness of breath. Negative for cough and hemoptysis.   Cardiovascular:  Positive for palpitations. Negative for chest pain and leg swelling.  Gastrointestinal:  Negative for abdominal pain, blood in stool, constipation, diarrhea, nausea and vomiting.  Genitourinary:  Negative for hematuria.   Skin: Negative.   Neurological:  Positive for dizziness, headaches and numbness. Negative for light-headedness.  Hematological:  Does not bruise/bleed easily.  Psychiatric/Behavioral:  Positive for sleep disturbance.      PHYSICAL EXAM:  ECOG PERFORMANCE STATUS: 2 - Symptomatic, <50% confined to bed  There were no vitals filed for this visit. There were no vitals filed for this visit. Physical Exam Constitutional:      Appearance: Normal appearance. She is morbidly obese.  HENT:     Head: Normocephalic and atraumatic.     Mouth/Throat:     Mouth: Mucous membranes are moist.  Eyes:     Extraocular Movements: Extraocular movements intact.     Pupils: Pupils are equal, round, and reactive to light.  Cardiovascular:     Rate and Rhythm: Normal rate and regular rhythm.     Pulses: Normal pulses.     Heart sounds: Murmur heard.  Pulmonary:     Effort: Pulmonary effort is normal.     Breath sounds: Normal breath sounds.  Abdominal:     General: Bowel sounds are normal.     Palpations: Abdomen is soft.     Tenderness: There is no abdominal tenderness.  Musculoskeletal:        General: No swelling.     Right lower leg: Edema (lymphedema wraps in place) present.     Left lower leg: Edema (lymphedema wraps in place) present.  Lymphadenopathy:     Cervical: No cervical adenopathy.  Skin:    General: Skin is warm and dry.  Neurological:     General: No focal deficit present.     Mental Status: She is alert and oriented to person, place, and time.  Psychiatric:        Mood and Affect: Mood normal.        Behavior: Behavior  normal.     PAST MEDICAL/SURGICAL HISTORY:  Past Medical History:  Diagnosis Date   Allergy    seasonal   Anxiety    Arthritis    hands, back    B12 deficiency 03/31/2021   Cataract    surgery to remove   Chronic back pain    lower back    Cirrhosis of liver without ascites ([place])    recent dx 07/11/18   Closed fracture of right tibial plateau 01/31/2018   Complex tear of medial meniscus of right knee 03/27/2018   COPD (chronic obstructive pulmonary disease) ([place])    no inhaler   Diabetes mellitus without complication (HCC)    DJD (degenerative joint disease)    Fibromyalgia    Genetic testing 03/08/2017   Ms. Godina underwent genetic counseling and testing for hereditary cancer syndromes on 02/21/2017. Her results were negative for mutations in all 83 genes analyzed by [facility]'s 83-gene Multi-Cancers Panel. Genes analyzed include: ALK, APC, ATM, AXIN2, BAP1, BARD1, BLM, BMPR1A, BRCA1, BRCA2, BRIP1, CASR, CDC73, CDH1, CDK4, CDKN1B, CDKN1C,  CDKN2A, CEBPA, CHEK2, CTNNA1, DICER1, DIS3L2, EGFR, EPCAM   GERD (gastroesophageal reflux disease)    Hearing loss    bilateral - no hearing aids   History of abnormal mammogram    History of arthroplasty of right knee 07/06/2018   Hyperlipidemia    Hypertension    Irregular heart beat    Neuromuscular disorder (HCC)    fibromyalgia   Sleep apnea    cpap some   Stress fracture of right tibia 03/27/2018   SVD (spontaneous vaginal delivery)    x 2   Tubular adenoma of colon 05/09/12   "innumerable number" of flat polyps   Past Surgical History:  Procedure Laterality Date   CARPAL TUNNEL RELEASE Left 10/20/14   [facility]   CATARACT EXTRACTION, BILATERAL     CESAREAN SECTION  1977   CHOLECYSTECTOMY  1990   COLONOSCOPY     IR RADIOLOGIST EVAL & MGMT  07/16/2019   KNEE ARTHROSCOPY WITH SUBCHONDROPLASTY Right 03/27/2018   Procedure: Right knee arthroscopic partial medial meniscus repair versus menisectomy with medial tibia subchondroplasty;   Surgeon: Nicholes Stairs, MD;  Location: [place];  Service: Orthopedics;  Laterality: Right;  120 mins   LARYNX SURGERY     POLYP REMOVAL   MULTIPLE TOOTH EXTRACTIONS     POLYPECTOMY     vocal cord   TONSILLECTOMY  1965   TUBAL LIGATION     TUMOR REMOVAL     benign fatty tumors on abdomen   UPPER GASTROINTESTINAL ENDOSCOPY      SOCIAL HISTORY:  Social History   Socioeconomic History   Marital status: Married    Spouse name: [name] "goes by [employer]"   Number of children: 3   Years of education: 11   Highest education level: GED or equivalent  Occupational History   Occupation: retired  Tobacco Use   Smoking status: Former    Packs/day: 1.00    Years: 3.00    Total pack years: 3.00    Types: Cigarettes    Quit date: 08/15/1969    Years since quitting: 53.0   Smokeless tobacco: Never  Vaping Use   Vaping Use: Never used  Substance and Sexual Activity   Alcohol use: No    Alcohol/week: 0.0 standard drinks of alcohol   Drug use: No   Sexual activity: Not Currently  Other Topics Concern   Not on file  Social History Narrative   Daughter lives with them and helps a lot   Social Determinants of Health   Financial Resource Strain: Low Risk  (02/10/2022)   Overall Financial Resource Strain (CARDIA)    Difficulty of Paying Living Expenses: Not hard at all  Food Insecurity: No Food Insecurity (02/10/2022)   Hunger Vital Sign    Worried About Running Out of Food in the Last Year: Never true    [place] in the Last Year: Never true  Transportation Needs: No Transportation Needs (02/10/2022)   PRAPARE - Hydrologist (Medical): No    Lack of Transportation (Non-Medical): No  Physical Activity: Inactive (02/10/2022)   Exercise Vital Sign    Days of Exercise per Week: 0 days    Minutes of Exercise per Session: 0 min  Stress: No Stress Concern Present (02/10/2022)   [place]     Feeling of Stress : Only a little  Social Connections: Socially Integrated (02/10/2022)   Social Connection and Isolation Panel [NHANES]  Frequency of Communication with Friends and Family: More than three times a week    Frequency of Social Gatherings with Friends and Family: More than three times a week    Attends Religious Services: More than 4 times per year    Active Member of [employer] or Organizations: Yes    Attends Music therapist: More than 4 times per year    Marital Status: Married  Human resources officer Violence: Not At Risk (02/10/2022)   Humiliation, Afraid, Rape, and Kick questionnaire    Fear of Current or Ex-Partner: No    Emotionally Abused: No    Physically Abused: No    Sexually Abused: No    FAMILY HISTORY:  Family History  Problem Relation Age of Onset   Heart failure Mother        d.37   Heart disease Mother    Prostate cancer Father        d.89 diagnosed in late-60s   Colon cancer Father        diagnosed at 71   Leukemia Maternal Aunt    Heart failure Maternal Uncle        d.50   Stomach cancer Paternal Aunt        questionable   Colon cancer Paternal Uncle        in his 62s   Heart failure Maternal Grandmother        d.45   Kidney cancer Cousin        d.78s maternal first-cousin   Colon polyps Brother        polyposis. Underwent partial colectomy at age 67.   Spina bifida Brother        died at birth   Colon polyps Son 73   Heart attack Son 50   Other Other 23       multiple lipomas   Esophageal cancer Neg Hx    Rectal cancer Neg Hx    Breast cancer Neg Hx     CURRENT MEDICATIONS:  Outpatient Encounter Medications as of 09/01/2022  Medication Sig Note   albuterol (VENTOLIN HFA) 108 (90 Base) MCG/ACT inhaler Inhale 2 puffs into the lungs every 6 (six) hours as needed for wheezing or shortness of breath.    ALPRAZolam (XANAX) 0.25 MG tablet  05/20/2022: As needed   atorvastatin (LIPITOR) 20 MG tablet Take 1 tablet by mouth once  daily    augmented betamethasone dipropionate (DIPROLENE-AF) 0.05 % cream Apply topically.    benazepril (LOTENSIN) 20 MG tablet Take 1 tablet (20 mg total) by mouth daily.    benzonatate (TESSALON PERLES) 100 MG capsule Take 1 capsule (100 mg total) by mouth 3 (three) times daily as needed.    Biotin 5000 MCG SUBL Place under the tongue.    blood glucose meter kit and supplies KIT Dispense based on patient and insurance preference. Use up to four times daily as directed. (FOR ICD-9 250.00, 250.01).    Blood Glucose Monitoring Suppl (ONETOUCH VERIO FLEX SYSTEM) w/Device KIT Check Bs up to 4 times daily Dx E11.9,    Cholecalciferol (VITAMIN D3) 5000 units CAPS Take 5,000 Units by mouth daily.    cyclobenzaprine (FLEXERIL) 5 MG tablet TAKE 1 TABLET BY MOUTH THREE TIMES DAILY AS NEEDED FOR MUSCLE SPASM (NEEDS  APPOINTMENT)    diltiazem (CARDIZEM CD) 120 MG 24 hr capsule Take 1 capsule by mouth once daily    fluticasone (FLONASE) 50 MCG/ACT nasal spray Place 2 sprays into both nostrils daily.    furosemide (  LASIX) 40 MG tablet Take 60 mg once daily (one and a half tablets). For a weight of 250 and higher, take 80 mg in the morning and 40 mg in the late afternoon    glucose blood (ONETOUCH VERIO) test strip CHECK GLUCOSE UP TO 4 TIMES DAILY Dx E11.69    lactulose (CHRONULAC) 10 GM/15ML solution TAKE 15 ML BY MOUTH  THREE TIMES DAILY AS NEEDED    meclizine (ANTIVERT) 25 MG tablet TAKE 1 TABLET BY MOUTH THREE TIMES DAILY AS NEEDED FOR DIZZINESS    ondansetron (ZOFRAN-ODT) 4 MG disintegrating tablet Take 1 tablet (4 mg total) by mouth every 6 (six) hours as needed for nausea or vomiting.    ONETOUCH DELICA LANCETS 41D MISC USE ONE LANCET TO CHECK GLUCOSE ONCE DAILY AND AS NEEDED    predniSONE (DELTASONE) 5 MG tablet Take 1 tablet (5 mg total) by mouth daily with breakfast.    rifAXIMin (XIFAXAN PO) Take 500 mg by mouth 2 (two) times daily.    spironolactone (ALDACTONE) 25 MG tablet Take 1 tablet (25 mg  total) by mouth daily.    traMADol (ULTRAM) 50 MG tablet Take 1 tablet (50 mg total) by mouth every 12 (twelve) hours as needed for moderate pain.    No facility-administered encounter medications on file as of 09/01/2022.    ALLERGIES:  Allergies  Allergen Reactions   Latex     "my hands feels numb"    LABORATORY DATA:  I have reviewed the labs as listed.  CBC    Component Value Date/Time   WBC 6.0 08/24/2022 1240   RBC 3.79 (L) 08/24/2022 1240   HGB 12.8 08/24/2022 1240   HGB 14.7 09/13/2021 1135   HCT 38.2 08/24/2022 1240   HCT 43.0 09/13/2021 1135   PLT 83 (L) 08/24/2022 1240   PLT 100 (LL) 09/13/2021 1135   MCV 100.8 (H) 08/24/2022 1240   MCV 98 (H) 09/13/2021 1135   MCH 33.8 08/24/2022 1240   MCHC 33.5 08/24/2022 1240   RDW 13.6 08/24/2022 1240   RDW 12.9 09/13/2021 1135   LYMPHSABS 2.0 08/24/2022 1240   LYMPHSABS 1.3 09/13/2021 1135   MONOABS 0.6 08/24/2022 1240   EOSABS 0.1 08/24/2022 1240   EOSABS 0.1 09/13/2021 1135   BASOSABS 0.0 08/24/2022 1240   BASOSABS 0.1 09/13/2021 1135      Latest Ref Rng & Units 08/24/2022   12:40 PM 05/20/2022   12:25 PM 03/01/2022    3:22 PM  CMP  Glucose 70 - 99 mg/dL 149  159  141   BUN 8 - 23 mg/dL 34  17  37   Creatinine 0.44 - 1.00 mg/dL 1.21  0.96  1.46   Sodium 135 - 145 mmol/L 137  143  142   Potassium 3.5 - 5.1 mmol/L 4.2  3.9  4.1   Chloride 98 - 111 mmol/L 105  102  103   CO2 22 - 32 mmol/L 26  36  26   Calcium 8.9 - 10.3 mg/dL 8.8  9.3  8.8   Total Protein 6.5 - 8.1 g/dL 5.7  5.6    Total Bilirubin 0.3 - 1.2 mg/dL 3.5  2.9    Alkaline Phos 38 - 126 U/L 71  129    AST 15 - 41 U/L 49  45    ALT 0 - 44 U/L 32  29      DIAGNOSTIC IMAGING:  I have independently reviewed the relevant imaging and discussed with the patient.  WRAP UP:  All questions were answered. The patient knows to call the clinic with any problems, questions or concerns.  Medical decision making: Moderate  Time spent on visit: I spent 20  minutes counseling the patient face to face. The total time spent in the appointment was 30 minutes and more than 50% was on counseling.  Harriett Rush, PA-C  09/01/22 3:50 PM

## 2022-08-31 NOTE — Addendum Note (Signed)
Addended by: Kathyrn Lass on: 08/31/2022 09:50 AM   Modules accepted: Orders

## 2022-08-31 NOTE — Addendum Note (Signed)
Addended by: Kathyrn Lass on: 08/31/2022 09:47 AM   Modules accepted: Orders

## 2022-08-31 NOTE — Telephone Encounter (Signed)
Pt c/o medication issue:  1. Name of Medication:   spironolactone (ALDACTONE) 25 MG tablet   2. How are you currently taking this medication (dosage and times per day)?     3. Are you having a reaction (difficulty breathing--STAT)?   4. What is your medication issue?   Daughter returned RN's call and stated the patient had been taking 1/2 tablet of this medication but recently had medication changed to 1 whole tablet.  Daughter stated their pharmacy will need to be updated on the medication change before they will refill the patient's medication.

## 2022-08-31 NOTE — Telephone Encounter (Signed)
Patient was contacted, advised that medication was sent to pharmacy.   Thanks!

## 2022-08-31 NOTE — Telephone Encounter (Signed)
Please go ahead with "Increase spironolactone to 25 mg daily, recheck labs (BMET) in 4-8 weeks."

## 2022-08-31 NOTE — Telephone Encounter (Signed)
Spoke to patient's daughter [name] she stated mother wanted her to call and verify Spironolactone directions.Advised she should take Spironolactone 25 mg daily.

## 2022-09-01 ENCOUNTER — Inpatient Hospital Stay: Payer: PPO

## 2022-09-01 ENCOUNTER — Encounter: Payer: Self-pay | Admitting: Physician Assistant

## 2022-09-01 ENCOUNTER — Inpatient Hospital Stay: Payer: PPO | Admitting: Physician Assistant

## 2022-09-01 ENCOUNTER — Other Ambulatory Visit: Payer: Self-pay

## 2022-09-01 ENCOUNTER — Inpatient Hospital Stay (HOSPITAL_BASED_OUTPATIENT_CLINIC_OR_DEPARTMENT_OTHER): Payer: PPO | Admitting: Physician Assistant

## 2022-09-01 VITALS — BP 144/50 | HR 83 | Temp 98.0°F | Resp 20 | Wt 220.2 lb

## 2022-09-01 DIAGNOSIS — Z79899 Other long term (current) drug therapy: Secondary | ICD-10-CM

## 2022-09-01 DIAGNOSIS — D696 Thrombocytopenia, unspecified: Secondary | ICD-10-CM | POA: Diagnosis not present

## 2022-09-01 DIAGNOSIS — E538 Deficiency of other specified B group vitamins: Secondary | ICD-10-CM | POA: Diagnosis not present

## 2022-09-01 DIAGNOSIS — E611 Iron deficiency: Secondary | ICD-10-CM

## 2022-09-01 MED ORDER — CYANOCOBALAMIN 1000 MCG/ML IJ SOLN
1000.0000 ug | Freq: Once | INTRAMUSCULAR | Status: AC
  Administered 2022-09-01: 1000 ug via INTRAMUSCULAR
  Filled 2022-09-01: qty 1

## 2022-09-01 NOTE — Patient Instructions (Signed)
[place] at [facility] Discharge Instructions  You were seen today by Tarri Abernethy PA-C for your low platelets.  As we discussed, your low platelets are due to your underlying liver disease.  They remain low, but stable at their baseline.  You do not need any treatment for low platelets at this time.  You should continue to receive monthly B12 injections.  Take your iron tablet EVERY OTHER DAY.  Make sure that your iron is not causing any constipation, and that you are still having about 3 bowel movements per day with your lactulose.  LABS: Return in 6 months for repeat labs  FOLLOW-UP APPOINTMENT: Office visit in 6 months, after labs   ** Thank you for trusting me with your healthcare!  I strive to provide all of my patients with quality care at each visit.  If you receive a survey for this visit, I would be so grateful to you for taking the time to provide feedback.  Thank you in advance!  ~ Leata Dominy                   Dr. Derek Jack   &   Tarri Abernethy, PA-C   - - - - - - - - - - - - - - - - - -     Thank you for choosing [place] at [facility] to provide your oncology and hematology care.  To afford each patient quality time with our provider, please arrive at least 15 minutes before your scheduled appointment time.   If you have a lab appointment with the [facility] please come in thru the Main Entrance and check in at the main information desk.  You need to re-schedule your appointment should you arrive 10 or more minutes late.  We strive to give you quality time with our providers, and arriving late affects you and other patients whose appointments are after yours.  Also, if you no show three or more times for appointments you may be dismissed from the clinic at the providers discretion.     Again, thank you for choosing [facility].  Our hope is that these requests will decrease the amount of time  that you wait before being seen by our physicians.       _____________________________________________________________  Should you have questions after your visit to [facility], please contact our office at [phone] and follow the prompts.  Our office hours are 8:00 a.m. and 4:30 p.m. Monday - Friday.  Please note that voicemails left after 4:00 p.m. may not be returned until the following business day.  We are closed weekends and major holidays.  You do have access to a nurse 24-7, just call the main number to the clinic [phone] and do not press any options, hold on the line and a nurse will answer the phone.    For prescription refill requests, have your pharmacy contact our office and allow 72 hours.    Due to Covid, you will need to wear a mask upon entering the hospital. If you do not have a mask, a mask will be given to you at the Main Entrance upon arrival. For doctor visits, patients may have 1 support person age 39 or older with them. For treatment visits, patients can not have anyone with them due to social distancing guidelines and our immunocompromised population.

## 2022-09-01 NOTE — Addendum Note (Signed)
Addended by: Betha Loa F on: 09/01/2022 08:11 AM   Modules accepted: Orders

## 2022-09-01 NOTE — Patient Instructions (Signed)
[facility]  Discharge Instructions: Thank you for choosing [facility] to provide your oncology and hematology care.  If you have a lab appointment with the Cancer Center, please come in thru the Main Entrance and check in at the main information desk.  Wear comfortable clothing and clothing appropriate for easy access to any Portacath or PICC line.   We strive to give you quality time with your provider. You may need to reschedule your appointment if you arrive late (15 or more minutes).  Arriving late affects you and other patients whose appointments are after yours.  Also, if you miss three or more appointments without notifying the office, you may be dismissed from the clinic at the provider's discretion.      For prescription refill requests, have your pharmacy contact our office and allow 72 hours for refills to be completed.    Today you received the following:  B12 injection   BELOW ARE SYMPTOMS THAT SHOULD BE REPORTED IMMEDIATELY: *FEVER GREATER THAN 100.4 F (38 C) OR HIGHER *CHILLS OR SWEATING *NAUSEA AND VOMITING THAT IS NOT CONTROLLED WITH YOUR NAUSEA MEDICATION *UNUSUAL SHORTNESS OF BREATH *UNUSUAL BRUISING OR BLEEDING *URINARY PROBLEMS (pain or burning when urinating, or frequent urination) *BOWEL PROBLEMS (unusual diarrhea, constipation, pain near the anus) TENDERNESS IN MOUTH AND THROAT WITH OR WITHOUT PRESENCE OF ULCERS (sore throat, sores in mouth, or a toothache) UNUSUAL RASH, SWELLING OR PAIN  UNUSUAL VAGINAL DISCHARGE OR ITCHING   Items with * indicate a potential emergency and should be followed up as soon as possible or go to the Emergency Department if any problems should occur.  Please show the CHEMOTHERAPY ALERT CARD or IMMUNOTHERAPY ALERT CARD at check-in to the Emergency Department and triage nurse.  Should you have questions after your visit or need to cancel or reschedule your appointment, please contact [facility] [phone]  and follow the prompts.  Office hours are 8:00 a.m. to 4:30 p.m. Monday - Friday. Please note that voicemails left after 4:00 p.m. may not be returned until the following business day.  We are closed weekends and major holidays. You have access to a nurse at all times for urgent questions. Please call the main number to the clinic [phone] and follow the prompts.  For any non-urgent questions, you may also contact your provider using MyChart. We now offer e-Visits for anyone 18 and older to request care online for non-urgent symptoms. For details visit [email or web address].   Also download the MyChart app! Go to the app store, search "MyChart", open the app, select [facility], and log in with your MyChart username and password.   

## 2022-09-01 NOTE — Telephone Encounter (Signed)
Left message on patient's phone regarding blood work in 4-8 weeks. Mailed lab order to address on file.

## 2022-09-01 NOTE — Progress Notes (Signed)
Carson Meche Foots presents today for injection per the provider's orders.  B12 administration without incident; injection site WNL; see MAR for injection details.  Patient tolerated procedure well and without incident.  No questions or complaints noted at this time.   B12 given today per MD orders. Tolerated infusion without adverse affects. Vital signs stable. No complaints at this time. Discharged from clinic via wheelchair in stable condition. Alert and oriented x 3. F/U with [facility] as scheduled.

## 2022-09-15 ENCOUNTER — Other Ambulatory Visit: Payer: Self-pay | Admitting: Family Medicine

## 2022-09-15 DIAGNOSIS — I152 Hypertension secondary to endocrine disorders: Secondary | ICD-10-CM

## 2022-09-28 ENCOUNTER — Other Ambulatory Visit: Payer: PPO

## 2022-09-28 DIAGNOSIS — Z79899 Other long term (current) drug therapy: Secondary | ICD-10-CM | POA: Diagnosis not present

## 2022-09-29 LAB — BASIC METABOLIC PANEL
BUN/Creatinine Ratio: 27 (ref 12–28)
BUN: 35 mg/dL — ABNORMAL HIGH (ref 8–27)
CO2: 25 mmol/L (ref 20–29)
Calcium: 9 mg/dL (ref 8.7–10.3)
Chloride: 104 mmol/L (ref 96–106)
Creatinine, Ser: 1.32 mg/dL — ABNORMAL HIGH (ref 0.57–1.00)
Glucose: 131 mg/dL — ABNORMAL HIGH (ref 70–99)
Potassium: 4.4 mmol/L (ref 3.5–5.2)
Sodium: 142 mmol/L (ref 134–144)
eGFR: 42 mL/min/{1.73_m2} — ABNORMAL LOW (ref >59)

## 2022-10-03 ENCOUNTER — Inpatient Hospital Stay: Payer: PPO | Attending: Hematology

## 2022-10-03 VITALS — BP 117/41 | HR 45 | Temp 97.0°F | Resp 18

## 2022-10-03 DIAGNOSIS — E538 Deficiency of other specified B group vitamins: Secondary | ICD-10-CM | POA: Diagnosis not present

## 2022-10-03 MED ORDER — CYANOCOBALAMIN 1000 MCG/ML IJ SOLN
1000.0000 ug | Freq: Once | INTRAMUSCULAR | Status: AC
  Administered 2022-10-03: 1000 ug via INTRAMUSCULAR
  Filled 2022-10-03: qty 1

## 2022-10-03 NOTE — Progress Notes (Signed)
Patient presents today for B12 injection per providers order.  Vital signs WNL.  Patient has no new complaints at this time.  Stable during administration without incident; injection site WNL; see MAR for injection details.  Patient tolerated procedure well and without incident.  No questions or complaints noted at this time.

## 2022-10-03 NOTE — Patient Instructions (Signed)
[facility]  Discharge Instructions: Thank you for choosing [facility] to provide your oncology and hematology care.  If you have a lab appointment with the [facility], please come in thru the Main Entrance and check in at the main information desk.  Wear comfortable clothing and clothing appropriate for easy access to any Portacath or PICC line.   We strive to give you quality time with your provider. You may need to reschedule your appointment if you arrive late (15 or more minutes).  Arriving late affects you and other patients whose appointments are after yours.  Also, if you miss three or more appointments without notifying the office, you may be dismissed from the clinic at the provider's discretion.      For prescription refill requests, have your pharmacy contact our office and allow 72 hours for refills to be completed.    Today you received the following chemotherapy and/or immunotherapy agents B12      To help prevent nausea and vomiting after your treatment, we encourage you to take your nausea medication as directed.  BELOW ARE SYMPTOMS THAT SHOULD BE REPORTED IMMEDIATELY: *FEVER GREATER THAN 100.4 F (38 C) OR HIGHER *CHILLS OR SWEATING *NAUSEA AND VOMITING THAT IS NOT CONTROLLED WITH YOUR NAUSEA MEDICATION *UNUSUAL SHORTNESS OF BREATH *UNUSUAL BRUISING OR BLEEDING *URINARY PROBLEMS (pain or burning when urinating, or frequent urination) *BOWEL PROBLEMS (unusual diarrhea, constipation, pain near the anus) TENDERNESS IN MOUTH AND THROAT WITH OR WITHOUT PRESENCE OF ULCERS (sore throat, sores in mouth, or a toothache) UNUSUAL RASH, SWELLING OR PAIN  UNUSUAL VAGINAL DISCHARGE OR ITCHING   Items with * indicate a potential emergency and should be followed up as soon as possible or go to the Emergency Department if any problems should occur.  Please show the CHEMOTHERAPY ALERT CARD or IMMUNOTHERAPY ALERT CARD at check-in to the Emergency  Department and triage nurse.  Should you have questions after your visit or need to cancel or reschedule your appointment, please contact [facility] [phone]  and follow the prompts.  Office hours are 8:00 a.m. to 4:30 p.m. Monday - Friday. Please note that voicemails left after 4:00 p.m. may not be returned until the following business day.  We are closed weekends and major holidays. You have access to a nurse at all times for urgent questions. Please call the main number to the clinic [phone] and follow the prompts.  For any non-urgent questions, you may also contact your provider using MyChart. We now offer e-Visits for anyone 13 and older to request care online for non-urgent symptoms. For details visit [email or web address].   Also download the MyChart app! Go to the app store, search "MyChart", open the app, select [facility], and log in with your MyChart username and password.

## 2022-10-05 ENCOUNTER — Telehealth: Payer: Self-pay | Admitting: Gastroenterology

## 2022-10-05 NOTE — Telephone Encounter (Signed)
Inbound call from pt daughter .want to speak with you regarding some paper she have to turn in for her mom ..please advise

## 2022-10-06 NOTE — Telephone Encounter (Signed)
Stacey Summers calls back. Nothing has been received from the patient assistance program for Xifaxan. She has spoken with our pharmacy team. She is asking if the provider part has been completed and returned?

## 2022-10-06 NOTE — Telephone Encounter (Signed)
Called the daughter [name]. No answer. Left her a message of my call.

## 2022-10-07 NOTE — Telephone Encounter (Signed)
[name], Im not sure what is going on with this. See prior phone note from  [facility] team. From back in Nov. They was working on this and tried to reach out to the patient. I have no paperwork or anything on [facility] since prior auth team took over.

## 2022-10-07 NOTE — Telephone Encounter (Signed)
Ayla or whomever is covering this  The daughter has not heard anything back on the patient assistance for Xifaxan. The patient is running out of medication and cannot refill.

## 2022-10-10 ENCOUNTER — Other Ambulatory Visit: Payer: Self-pay | Admitting: Gastroenterology

## 2022-10-11 ENCOUNTER — Other Ambulatory Visit: Payer: Self-pay | Admitting: Cardiovascular Disease

## 2022-10-11 ENCOUNTER — Other Ambulatory Visit: Payer: Self-pay | Admitting: Family Medicine

## 2022-10-11 DIAGNOSIS — E1169 Type 2 diabetes mellitus with other specified complication: Secondary | ICD-10-CM

## 2022-10-11 DIAGNOSIS — E1159 Type 2 diabetes mellitus with other circulatory complications: Secondary | ICD-10-CM

## 2022-10-17 ENCOUNTER — Other Ambulatory Visit: Payer: Self-pay | Admitting: Family Medicine

## 2022-10-17 DIAGNOSIS — I152 Hypertension secondary to endocrine disorders: Secondary | ICD-10-CM

## 2022-10-17 NOTE — Telephone Encounter (Signed)
[place] NTBS 30 days given 09/15/22

## 2022-10-18 ENCOUNTER — Encounter: Payer: Self-pay | Admitting: Family Medicine

## 2022-10-18 ENCOUNTER — Telehealth: Payer: Self-pay | Admitting: Family Medicine

## 2022-10-18 DIAGNOSIS — I152 Hypertension secondary to endocrine disorders: Secondary | ICD-10-CM

## 2022-10-18 DIAGNOSIS — E1159 Type 2 diabetes mellitus with other circulatory complications: Secondary | ICD-10-CM

## 2022-10-18 MED ORDER — BENAZEPRIL HCL 20 MG PO TABS: 20.0000 mg | ORAL_TABLET | Freq: Every day | ORAL | 1 refills | Status: DC

## 2022-10-18 NOTE — Telephone Encounter (Signed)
Pt aware refill sent to pharmacy 

## 2022-10-18 NOTE — Telephone Encounter (Signed)
LMTCB & make an appt w/Dr [name] to get more refills on her meds. Also, mailed a letter to patient.

## 2022-10-21 NOTE — Telephone Encounter (Signed)
I am forwarding this to the RX  prior auth team. It seems they are working on this

## 2022-10-21 NOTE — Telephone Encounter (Addendum)
Patient Assistance forms was faxed to the Rx Prior Auth team on 09/23/2022. I found confirmation fax from 09/23/2022 at 1:12 pm.  I did resubmit the application today to see if we can get a response from [facility].   I called [name], the daughter and left detailed message about her moms patient assistance forms and we can try to get her sample until this is approved.

## 2022-10-21 NOTE — Telephone Encounter (Signed)
Patient is calling to get an update on the assistance documentation she needs for Xifaxan. Please advise.

## 2022-11-01 ENCOUNTER — Inpatient Hospital Stay: Payer: PPO

## 2022-11-02 ENCOUNTER — Encounter: Payer: Self-pay | Admitting: Family Medicine

## 2022-11-02 ENCOUNTER — Ambulatory Visit (INDEPENDENT_AMBULATORY_CARE_PROVIDER_SITE_OTHER): Payer: PPO | Admitting: Family Medicine

## 2022-11-02 VITALS — BP 98/48 | HR 43 | Temp 97.4°F | Ht 61.0 in | Wt 220.0 lb

## 2022-11-02 DIAGNOSIS — J329 Chronic sinusitis, unspecified: Secondary | ICD-10-CM | POA: Diagnosis not present

## 2022-11-02 DIAGNOSIS — K7581 Nonalcoholic steatohepatitis (NASH): Secondary | ICD-10-CM

## 2022-11-02 DIAGNOSIS — K7460 Unspecified cirrhosis of liver: Secondary | ICD-10-CM

## 2022-11-02 DIAGNOSIS — J40 Bronchitis, not specified as acute or chronic: Secondary | ICD-10-CM | POA: Diagnosis not present

## 2022-11-02 MED ORDER — ALBUTEROL SULFATE HFA 108 (90 BASE) MCG/ACT IN AERS: 2.0000 | INHALATION_SPRAY | Freq: Four times a day (QID) | RESPIRATORY_TRACT | 0 refills | Status: DC | PRN

## 2022-11-02 MED ORDER — AZITHROMYCIN 250 MG PO TABS: ORAL_TABLET | ORAL | 0 refills | Status: DC

## 2022-11-02 NOTE — Progress Notes (Signed)
Chief Complaint  Patient presents with   Cough   Nasal Congestion    HPI  Patient presents today for congestion and cough for a week. No rhinorrhea or sputum. Not dyspneic. Denies fever, sore throat, drainage. Ears feel stuffy.   PMH: Smoking status noted ROS: Per HPI  Objective: BP (!) 98/48   Pulse (!) 43   Temp (!) 97.4 F (36.3 C)   Ht 5\' 1"  (1.549 m)   Wt 220 lb (99.8 kg)   SpO2 97%   BMI 41.57 kg/m  Gen: NAD, alert, cooperative with exam HEENT: NCAT, EOMI, PERRL. Tms clear. Nasal passages inflamed CV: RRR, good S1/S2, no murmur Resp: CTABL, bronchitic changes noted.  Abd: SNTND, BS present, no guarding or organomegaly Ext: No edema, warm Neuro: Alert and oriented, No gross deficits  Assessment and plan:  1. Sinobronchitis   2. Liver cirrhosis secondary to NASH ([place])     Meds ordered this encounter  Medications   azithromycin (ZITHROMAX Z-PAK) 250 MG tablet    Sig: Take two right away Then one a day for the next 4 days.    Dispense:  6 each    Refill:  0   albuterol (VENTOLIN HFA) 108 (90 Base) MCG/ACT inhaler    Sig: Inhale 2 puffs into the lungs every 6 (six) hours as needed for wheezing or shortness of breath.    Dispense:  18 g    Refill:  0      Follow up as needed.  Claretta Fraise, MD

## 2022-11-04 ENCOUNTER — Inpatient Hospital Stay: Payer: PPO

## 2022-11-08 ENCOUNTER — Encounter: Payer: Self-pay | Admitting: Family Medicine

## 2022-11-09 ENCOUNTER — Inpatient Hospital Stay: Payer: PPO | Attending: Hematology

## 2022-11-09 ENCOUNTER — Other Ambulatory Visit (HOSPITAL_COMMUNITY): Payer: Self-pay

## 2022-11-09 VITALS — BP 123/43 | HR 87 | Temp 97.4°F | Resp 18

## 2022-11-09 DIAGNOSIS — E538 Deficiency of other specified B group vitamins: Secondary | ICD-10-CM

## 2022-11-09 MED ORDER — CYANOCOBALAMIN 1000 MCG/ML IJ SOLN
1000.0000 ug | Freq: Once | INTRAMUSCULAR | Status: AC
  Administered 2022-11-09: 1000 ug via INTRAMUSCULAR
  Filled 2022-11-09: qty 1

## 2022-11-09 NOTE — Patient Instructions (Signed)
[facility]  Discharge Instructions: Thank you for choosing [facility] to provide your oncology and hematology care.  If you have a lab appointment with the Cancer Center, please come in thru the Main Entrance and check in at the main information desk.  Wear comfortable clothing and clothing appropriate for easy access to any Portacath or PICC line.   We strive to give you quality time with your provider. You may need to reschedule your appointment if you arrive late (15 or more minutes).  Arriving late affects you and other patients whose appointments are after yours.  Also, if you miss three or more appointments without notifying the office, you may be dismissed from the clinic at the provider's discretion.      For prescription refill requests, have your pharmacy contact our office and allow 72 hours for refills to be completed.    Today you received the following  B12 injection, return as scheduled.   To help prevent nausea and vomiting after your treatment, we encourage you to take your nausea medication as directed.  BELOW ARE SYMPTOMS THAT SHOULD BE REPORTED IMMEDIATELY: *FEVER GREATER THAN 100.4 F (38 C) OR HIGHER *CHILLS OR SWEATING *NAUSEA AND VOMITING THAT IS NOT CONTROLLED WITH YOUR NAUSEA MEDICATION *UNUSUAL SHORTNESS OF BREATH *UNUSUAL BRUISING OR BLEEDING *URINARY PROBLEMS (pain or burning when urinating, or frequent urination) *BOWEL PROBLEMS (unusual diarrhea, constipation, pain near the anus) TENDERNESS IN MOUTH AND THROAT WITH OR WITHOUT PRESENCE OF ULCERS (sore throat, sores in mouth, or a toothache) UNUSUAL RASH, SWELLING OR PAIN  UNUSUAL VAGINAL DISCHARGE OR ITCHING   Items with * indicate a potential emergency and should be followed up as soon as possible or go to the Emergency Department if any problems should occur.  Please show the CHEMOTHERAPY ALERT CARD or IMMUNOTHERAPY ALERT CARD at check-in to the Emergency Department and  triage nurse.  Should you have questions after your visit or need to cancel or reschedule your appointment, please contact [facility] AT  [phone]  and follow the prompts.  Office hours are 8:00 a.m. to 4:30 p.m. Monday - Friday. Please note that voicemails left after 4:00 p.m. may not be returned until the following business day.  We are closed weekends and major holidays. You have access to a nurse at all times for urgent questions. Please call the main number to the clinic [phone] and follow the prompts.  For any non-urgent questions, you may also contact your provider using MyChart. We now offer e-Visits for anyone 18 and older to request care online for non-urgent symptoms. For details visit [email or web address].   Also download the MyChart app! Go to the app store, search "MyChart", open the app, select [facility], and log in with your MyChart username and password.   

## 2022-11-09 NOTE — Progress Notes (Signed)
Patient tolerated injection with no complaints voiced. Site clean and dry with no bruising or swelling noted at site. See MAR for details. Band aid applied.  Patient stable during and after injection. VSS with discharge and left in satisfactory condition with no s/s of distress noted.  

## 2022-11-09 NOTE — Telephone Encounter (Signed)
Apologies for the delay/confusion, this was started when I was just hired and training in November. I did call to follow up, as I see that pt has brought in the information requested and previous notes say it has been resubmitted to [facility]. Unfortunately, the representative did state that they did receive a call from pt's daughter, but have not received the application itself. The rep did inform me that they are able to search their queue by fax number, but that usually applications begin the review process within 48 hours of being received. I did resend the pt's original application along with the corresponding documentation, indexed in the patient's chart. Was advised to call in an hour to ensure that application was received. Will follow up later today. Thank you.

## 2022-11-10 ENCOUNTER — Other Ambulatory Visit: Payer: Self-pay | Admitting: Gastroenterology

## 2022-11-15 NOTE — Telephone Encounter (Signed)
Thanks for the update Ayla, Any word yet? We all have faxed this a few times I'm not sure what is going on with [facility].. Thank You

## 2022-11-16 NOTE — Telephone Encounter (Signed)
Patient has been approved for Xifaxan patient assistance until 08/15/2023. Initial fill should be received in 7 to 10 business days, pt will have to call [facility] for following refills, the automated system suggested that the pt call to schedule next delivery when down to 2 weeks worth of medicine.

## 2022-11-25 ENCOUNTER — Encounter: Payer: Self-pay | Admitting: Family Medicine

## 2022-11-25 ENCOUNTER — Ambulatory Visit (INDEPENDENT_AMBULATORY_CARE_PROVIDER_SITE_OTHER): Payer: PPO | Admitting: Family Medicine

## 2022-11-25 ENCOUNTER — Other Ambulatory Visit: Payer: PPO

## 2022-11-25 VITALS — BP 132/78 | HR 58 | Temp 98.6°F | Ht 61.0 in | Wt 224.0 lb

## 2022-11-25 DIAGNOSIS — D731 Hypersplenism: Secondary | ICD-10-CM | POA: Diagnosis not present

## 2022-11-25 DIAGNOSIS — Z5309 Procedure and treatment not carried out because of other contraindication: Secondary | ICD-10-CM | POA: Insufficient documentation

## 2022-11-25 DIAGNOSIS — D7589 Other specified diseases of blood and blood-forming organs: Secondary | ICD-10-CM | POA: Diagnosis not present

## 2022-11-25 DIAGNOSIS — N189 Chronic kidney disease, unspecified: Secondary | ICD-10-CM | POA: Diagnosis not present

## 2022-11-25 DIAGNOSIS — N1832 Chronic kidney disease, stage 3b: Secondary | ICD-10-CM | POA: Diagnosis not present

## 2022-11-25 DIAGNOSIS — E1122 Type 2 diabetes mellitus with diabetic chronic kidney disease: Secondary | ICD-10-CM

## 2022-11-25 DIAGNOSIS — M5442 Lumbago with sciatica, left side: Secondary | ICD-10-CM | POA: Diagnosis not present

## 2022-11-25 DIAGNOSIS — E1159 Type 2 diabetes mellitus with other circulatory complications: Secondary | ICD-10-CM

## 2022-11-25 DIAGNOSIS — K7581 Nonalcoholic steatohepatitis (NASH): Secondary | ICD-10-CM

## 2022-11-25 DIAGNOSIS — E785 Hyperlipidemia, unspecified: Secondary | ICD-10-CM | POA: Diagnosis not present

## 2022-11-25 DIAGNOSIS — K130 Diseases of lips: Secondary | ICD-10-CM

## 2022-11-25 DIAGNOSIS — K7460 Unspecified cirrhosis of liver: Secondary | ICD-10-CM

## 2022-11-25 DIAGNOSIS — R809 Proteinuria, unspecified: Secondary | ICD-10-CM | POA: Diagnosis not present

## 2022-11-25 DIAGNOSIS — I152 Hypertension secondary to endocrine disorders: Secondary | ICD-10-CM

## 2022-11-25 DIAGNOSIS — F411 Generalized anxiety disorder: Secondary | ICD-10-CM

## 2022-11-25 DIAGNOSIS — Z79899 Other long term (current) drug therapy: Secondary | ICD-10-CM

## 2022-11-25 DIAGNOSIS — G8929 Other chronic pain: Secondary | ICD-10-CM | POA: Diagnosis not present

## 2022-11-25 DIAGNOSIS — E1169 Type 2 diabetes mellitus with other specified complication: Secondary | ICD-10-CM | POA: Diagnosis not present

## 2022-11-25 DIAGNOSIS — E1129 Type 2 diabetes mellitus with other diabetic kidney complication: Secondary | ICD-10-CM | POA: Diagnosis not present

## 2022-11-25 DIAGNOSIS — D6959 Other secondary thrombocytopenia: Secondary | ICD-10-CM | POA: Diagnosis not present

## 2022-11-25 DIAGNOSIS — B351 Tinea unguium: Secondary | ICD-10-CM | POA: Diagnosis not present

## 2022-11-25 DIAGNOSIS — I5032 Chronic diastolic (congestive) heart failure: Secondary | ICD-10-CM | POA: Diagnosis not present

## 2022-11-25 LAB — BAYER DCA HB A1C WAIVED: HB A1C (BAYER DCA - WAIVED): 5.4 % (ref 4.8–5.6)

## 2022-11-25 MED ORDER — TRAMADOL HCL 50 MG PO TABS: 50.0000 mg | ORAL_TABLET | Freq: Two times a day (BID) | ORAL | 2 refills | Status: DC | PRN

## 2022-11-25 MED ORDER — CICLOPIROX 8 % EX SOLN: Freq: Every day | CUTANEOUS | 2 refills | Status: DC

## 2022-11-25 MED ORDER — ALPRAZOLAM 0.25 MG PO TABS: ORAL_TABLET | ORAL | 0 refills | Status: DC

## 2022-11-25 NOTE — Patient Instructions (Signed)
DO NOT TAKE THE CHOLESTEROL MEDICATION ANYMORE This is CONTRAINDICATED due to your liver function.

## 2022-11-25 NOTE — Progress Notes (Signed)
Subjective: CC: Chronic follow-up PCP: [name], DO Stacey Summers is a 77 y.o. female presenting to clinic today for:  1.  Chronic back pain Patient with known chronic back pain.  She uses tramadol extremely sparingly and in fact has not had a prescription for over a year now.  She still had a couple of pills left over and she continues to use these if needed but does need a refill  2.  Anxiety disorder Patient utilizes the Xanax extremely sparingly and again has not had this prescription since 2022.  She really does not become dependent on anything so she tries to not use these if she can avoid them.  No memory changes as long she is taking her medications for her liver failure  3.  NASH with cirrhosis Patient is compliant with lactulose, Xifaxan.  She has not had any issues but does admit that sometimes it does give her diarrhea.  She tries to be extremely careful about the medication she takes but admits that she is still taking a statin drug prescribed by her cardiologist.  4.  Toenail issues Patient reports that she has onychomycotic changes to the nails.  She is been utilizing something over-the-counter but it is causing her Nails to crack and break.  She does not see podiatrist just yet but would be amenable to a prescription topically  5.  Type 2 diabetes associated with hypertension, hyperlipidemia and CKD3b Patient has labs that she needs to get collected for Dr. Wolfgang Phoenix.  This includes a urine specimen.  She is overdue for urine microalbumin.  She does take a statin as above but did not realize this was contraindicated in advanced liver disease.  She is compliant with her Cardizem, benazeprl.  Her diabetes has been diet controlled  6.  Lip lesion Patient reports a couple of lesions on her face, 1 on her lower lip on the right that hurts sometimes.  She addressed this with her dermatologist several years ago was told that it needed to be a separate appointment.   She needs to get a referral back to them so that she can have it removed  ROS: Per HPI  Allergies  Allergen Reactions   Latex     "my hands feels numb"   Past Medical History:  Diagnosis Date   Allergy    seasonal   Anxiety    Arthritis    hands, back    B12 deficiency 03/31/2021   Cataract    surgery to remove   Chronic back pain    lower back    Cirrhosis of liver without ascites    recent dx 07/11/18   Closed fracture of right tibial plateau 01/31/2018   Complex tear of medial meniscus of right knee 03/27/2018   COPD (chronic obstructive pulmonary disease)    no inhaler   Diabetes mellitus without complication    DJD (degenerative joint disease)    Fibromyalgia    Genetic testing 03/08/2017   Ms. Wilczynski underwent genetic counseling and testing for hereditary cancer syndromes on 02/21/2017. Her results were negative for mutations in all 83 genes analyzed by [facility]'s 83-gene Multi-Cancers Panel. 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   History of abnormal mammogram    History of  arthroplasty of right knee 07/06/2018   Hyperlipidemia    Hypertension    Irregular heart beat    Neuromuscular disorder    fibromyalgia   Sleep apnea    cpap some   Stress fracture of right tibia 03/27/2018   SVD (spontaneous vaginal delivery)    x 2   Tubular adenoma of colon 05/09/12   "innumerable number" of flat polyps    Current Outpatient Medications:    albuterol (VENTOLIN HFA) 108 (90 Base) MCG/ACT inhaler, Inhale 2 puffs into the lungs every 6 (six) hours as needed for wheezing or shortness of breath., Disp: 18 g, Rfl: 0   augmented betamethasone dipropionate (DIPROLENE-AF) 0.05 % cream, Apply topically., Disp: , Rfl:    benazepril (LOTENSIN)  20 MG tablet, Take 1 tablet (20 mg total) by mouth daily., Disp: 30 tablet, Rfl: 1   Biotin 5000 MCG SUBL, Place under the tongue., Disp: , Rfl:    blood glucose meter kit and supplies KIT, Dispense based on patient and insurance preference. Use up to four times daily as directed. (FOR ICD-9 250.00, 250.01)., Disp: 1 each, Rfl: 10   Blood Glucose Monitoring Suppl (ONETOUCH VERIO FLEX SYSTEM) w/Device KIT, Check Bs up to 4 times daily Dx E11.9,, Disp: 1 kit, Rfl: 0   Cholecalciferol (VITAMIN D3) 5000 units CAPS, Take 5,000 Units by mouth daily., Disp: , Rfl:    ciclopirox (PENLAC) 8 % solution, Apply topically at bedtime. Apply over nail and surrounding skin. Apply daily over previous coat. After seven (7) days, may remove with alcohol and continue cycle., Disp: 6.6 mL, Rfl: 2   cyclobenzaprine (FLEXERIL) 5 MG tablet, TAKE 1 TABLET BY MOUTH THREE TIMES DAILY AS NEEDED FOR MUSCLE SPASM (NEEDS  APPOINTMENT), Disp: 30 tablet, Rfl: 0   diltiazem (CARDIZEM CD) 120 MG 24 hr capsule, Take 1 capsule by mouth once daily, Disp: 90 capsule, Rfl: 3   fluticasone (FLONASE) 50 MCG/ACT nasal spray, Place 2 sprays into both nostrils daily., Disp: 16 g, Rfl: 6   furosemide (LASIX) 40 MG tablet, Take 60 mg once daily (one and a half tablets). For a weight of 250 and higher, take 80 mg in the morning and 40 mg in the late afternoon, Disp: 135 tablet, Rfl: 1   glucose blood (ONETOUCH VERIO) test strip, CHECK GLUCOSE UP TO 4 TIMES DAILY Dx E11.69, Disp: 400 each, Rfl: 3   lactulose (CHRONULAC) 10 GM/15ML solution, TAKE 15 ML BY MOUTH  THREE TIMES DAILY AS NEEDED, Disp: 946 mL, Rfl: 0   meclizine (ANTIVERT) 25 MG tablet, TAKE 1 TABLET BY MOUTH THREE TIMES DAILY AS NEEDED FOR DIZZINESS, Disp: 90 tablet, Rfl: 1   ondansetron (ZOFRAN-ODT) 4 MG disintegrating tablet, Take 1 tablet (4 mg total) by mouth every 6 (six) hours as needed for nausea or vomiting., Disp: 30 tablet, Rfl: 0   ONETOUCH DELICA LANCETS 33G MISC, USE ONE  LANCET TO CHECK GLUCOSE ONCE DAILY AND AS NEEDED, Disp: 100 each, Rfl: 0   rifAXIMin (XIFAXAN PO), Take 500 mg by mouth 2 (two) times daily., Disp: , Rfl:    spironolactone (ALDACTONE) 25 MG tablet, Take 1 tablet (25 mg total) by mouth daily., Disp: 90 tablet, Rfl: 3   XIFAXAN 550 MG TABS tablet, TAKE ONE TABLET BY MOUTH TWICE A DAY, Disp: 180 tablet, Rfl: 2   ALPRAZolam (XANAX) 0.25 MG tablet, Take 1/2-1 tablet once daily as needed for severe anxiety/ panic attack. May cause hallucinations/ falls, Disp: 30 tablet, Rfl: 0  traMADol (ULTRAM) 50 MG tablet, Take 1 tablet (50 mg total) by mouth every 12 (twelve) hours as needed for severe pain., Disp: 30 tablet, Rfl: 2 Social History   Socioeconomic History   Marital status: Married    Spouse name: Molly Maduro "goes by [employer]"   Number of children: 3   Years of education: 11   Highest education level: GED or equivalent  Occupational History   Occupation: retired  Tobacco Use   Smoking status: Former    Packs/day: 1.00    Years: 3.00    Additional pack years: 0.00    Total pack years: 3.00    Types: Cigarettes    Quit date: 08/15/1969    Years since quitting: 53.3   Smokeless tobacco: Never  Vaping Use   Vaping Use: Never used  Substance and Sexual Activity   Alcohol use: No    Alcohol/week: 0.0 standard drinks of alcohol   Drug use: No   Sexual activity: Not Currently  Other Topics Concern   Not on file  Social History Narrative   Daughter lives with them and helps a lot   Social Determinants of Health   Financial Resource Strain: Low Risk  (02/10/2022)   Overall Financial Resource Strain (CARDIA)    Difficulty of Paying Living Expenses: Not hard at all  Food Insecurity: No Food Insecurity (02/10/2022)   Hunger Vital Sign    Worried About Running Out of Food in the Last Year: Never true    Ran Out of Food in the Last Year: Never true  Transportation Needs: No Transportation Needs (02/10/2022)   PRAPARE - Scientist, research (physical sciences) (Medical): No    Lack of Transportation (Non-Medical): No  Physical Activity: Inactive (02/10/2022)   Exercise Vital Sign    Days of Exercise per Week: 0 days    Minutes of Exercise per Session: 0 min  Stress: No Stress Concern Present (02/10/2022)   [employer] of Occupational Health - Occupational Stress Questionnaire    Feeling of Stress : Only a little  Social Connections: Socially Integrated (02/10/2022)   Social Connection and Isolation Panel [NHANES]    Frequency of Communication with Friends and Family: More than three times a week    Frequency of Social Gatherings with Friends and Family: More than three times a week    Attends Religious Services: More than 4 times per year    Active Member of [employer] or Organizations: Yes    Attends Engineer, structural: More than 4 times per year    Marital Status: Married  Catering manager Violence: Not At Risk (02/10/2022)   Humiliation, Afraid, Rape, and Kick questionnaire    Fear of Current or Ex-Partner: No    Emotionally Abused: No    Physically Abused: No    Sexually Abused: No   Family History  Problem Relation Age of Onset   Heart failure Mother        d.37   Heart disease Mother    Prostate cancer Father        d.89 diagnosed in late-60s   Colon cancer Father        diagnosed at 67   Leukemia Maternal Aunt    Heart failure Maternal Uncle        d.50   Stomach cancer Paternal Aunt        questionable   Colon cancer Paternal Uncle        in his 1s   Heart failure Maternal Grandmother  d.45   Kidney cancer Cousin        d.45s maternal first-cousin   Colon polyps Brother        polyposis. Underwent partial colectomy at age 54.   Spina bifida Brother        died at birth   Colon polyps Son 48   Heart attack Son 70   Other Other 16       multiple lipomas   Esophageal cancer Neg Hx    Rectal cancer Neg Hx    Breast cancer Neg Hx     Objective: Office vital signs reviewed. BP  132/78   Pulse (!) 58   Temp 98.6 F (37 C)   Ht  (1.549 m)   Wt 224 lb (101.6 kg)   SpO2 99%   BMI 42.32 kg/m   Physical Examination:  General: Awake, alert, nontoxic female, No acute distress HEENT: Slight gray appearing but no scleral icterus appreciated. Cardio: Bradycardic rate and rhythm, S1S2 heard, no murmurs appreciated Pulm: clear to auscultation bilaterally, no wheezes, rhonchi or rales; normal work of breathing on room air Skin: Cystic lesion noted along the right lower lip Neuro: see below.  Alert and oriented x 4  Diabetic Foot Exam - Simple   Simple Foot Form Diabetic Foot exam was performed with the following findings: Yes 11/25/2022  4:57 PM  Visual Inspection See comments: Yes Sensation Testing Intact to touch and monofilament testing bilaterally: Yes Pulse Check Posterior Tibialis and Dorsalis pulse intact bilaterally: Yes Comments Onychomycotic changes noted to the nails bilaterally.     Assessment/ Plan: 77 y.o. female   Type 2 diabetes mellitus with stage 3b chronic kidney disease, without long-term current use of insulin - Plan: Bayer DCA Hb A1c Waived, CANCELED: Microalbumin / creatinine urine ratio  Hyperlipidemia associated with type 2 diabetes mellitus  Hypertension associated with diabetes  Contraindication to statin medication  Liver cirrhosis secondary to NASH - Plan: ciclopirox (PENLAC) 8 % solution  Onychomycosis - Plan: ciclopirox (PENLAC) 8 % solution  Chronic left-sided low back pain with left-sided sciatica - Plan: traMADol (ULTRAM) 50 MG tablet, ToxASSURE Select 13 (MW), Urine  GAD (generalized anxiety disorder) - Plan: ALPRAZolam (XANAX) 0.25 MG tablet, ToxASSURE Select 13 (MW), Urine  Controlled substance agreement signed - Plan: ToxASSURE Select 13 (MW), Urine  Lip lesion - Plan: Ambulatory referral to Dermatology  Sugar is controlled.  No changes.  Urine microalbumin per nephrology.  Will get this copied and  downloaded once available  Blood pressure is controlled  Given her nonalcoholic liver cirrhosis, I highly recommended that she discontinue the statin as this is contraindicated.  I have given her a Penlac solution for her onychomycosis as again I do not think Lamisil would be a safe option for her given liver cirrhosis  Tramadol and Xanax were renewed for her today.  UDS and CSA are up-to-date as per office policy.  National narcotic database reviewed and there were no red flags.  Continue to use sparingly  Referral to dermatology placed for lesions of the face   Orders Placed This Encounter  Procedures   Bayer DCA Hb A1c Waived   ToxASSURE Select 13 (MW), Urine    Current Outpatient Medications:    albuterol (VENTOLIN HFA) 108 (90 Base) MCG/ACT inhaler, Inhale 2 puffs into the lungs every 6 (six) hours as needed for wheezing or shortness of breath., Disp: 18 g, Rfl: 0   augmented betamethasone dipropionate (DIPROLENE-AF) 0.05 % cream, Apply topically., Disp: ,  Rfl:    benazepril (LOTENSIN) 20 MG tablet, Take 1 tablet (20 mg total) by mouth daily., Disp: 30 tablet, Rfl: 1   Biotin 5000 MCG SUBL, Place under the tongue., Disp: , Rfl:    blood glucose meter kit and supplies KIT, Dispense based on patient and insurance preference. Use up to four times daily as directed. (FOR ICD-9 250.00, 250.01)., Disp: 1 each, Rfl: 10   Blood Glucose Monitoring Suppl (ONETOUCH VERIO FLEX SYSTEM) w/Device KIT, Check Bs up to 4 times daily Dx E11.9,, Disp: 1 kit, Rfl: 0   Cholecalciferol (VITAMIN D3) 5000 units CAPS, Take 5,000 Units by mouth daily., Disp: , Rfl:    ciclopirox (PENLAC) 8 % solution, Apply topically at bedtime. Apply over nail and surrounding skin. Apply daily over previous coat. After seven (7) days, may remove with alcohol and continue cycle., Disp: 6.6 mL, Rfl: 2   cyclobenzaprine (FLEXERIL) 5 MG tablet, TAKE 1 TABLET BY MOUTH THREE TIMES DAILY AS NEEDED FOR MUSCLE SPASM (NEEDS   APPOINTMENT), Disp: 30 tablet, Rfl: 0   diltiazem (CARDIZEM CD) 120 MG 24 hr capsule, Take 1 capsule by mouth once daily, Disp: 90 capsule, Rfl: 3   fluticasone (FLONASE) 50 MCG/ACT nasal spray, Place 2 sprays into both nostrils daily., Disp: 16 g, Rfl: 6   furosemide (LASIX) 40 MG tablet, Take 60 mg once daily (one and a half tablets). For a weight of 250 and higher, take 80 mg in the morning and 40 mg in the late afternoon, Disp: 135 tablet, Rfl: 1   glucose blood (ONETOUCH VERIO) test strip, CHECK GLUCOSE UP TO 4 TIMES DAILY Dx E11.69, Disp: 400 each, Rfl: 3   lactulose (CHRONULAC) 10 GM/15ML solution, TAKE 15 ML BY MOUTH  THREE TIMES DAILY AS NEEDED, Disp: 946 mL, Rfl: 0   meclizine (ANTIVERT) 25 MG tablet, TAKE 1 TABLET BY MOUTH THREE TIMES DAILY AS NEEDED FOR DIZZINESS, Disp: 90 tablet, Rfl: 1   ondansetron (ZOFRAN-ODT) 4 MG disintegrating tablet, Take 1 tablet (4 mg total) by mouth every 6 (six) hours as needed for nausea or vomiting., Disp: 30 tablet, Rfl: 0   ONETOUCH DELICA LANCETS 33G MISC, USE ONE LANCET TO CHECK GLUCOSE ONCE DAILY AND AS NEEDED, Disp: 100 each, Rfl: 0   rifAXIMin (XIFAXAN PO), Take 500 mg by mouth 2 (two) times daily., Disp: , Rfl:    spironolactone (ALDACTONE) 25 MG tablet, Take 1 tablet (25 mg total) by mouth daily., Disp: 90 tablet, Rfl: 3   XIFAXAN 550 MG TABS tablet, TAKE ONE TABLET BY MOUTH TWICE A DAY, Disp: 180 tablet, Rfl: 2   ALPRAZolam (XANAX) 0.25 MG tablet, Take 1/2-1 tablet once daily as needed for severe anxiety/ panic attack. May cause hallucinations/ falls, Disp: 30 tablet, Rfl: 0   traMADol (ULTRAM) 50 MG tablet, Take 1 tablet (50 mg total) by mouth every 12 (twelve) hours as needed for severe pain., Disp: 30 tablet, Rfl: 2   Ambulatory referral to Dermatology    Referral Priority:   Routine    Referral Type:   Consultation    Referral Reason:   Specialty Services Required    Requested Specialty:   Dermatology    Number of Visits  Requested:   1   Meds ordered this encounter  Medications   ciclopirox (PENLAC) 8 % solution    Sig: Apply topically at bedtime. Apply over nail and surrounding skin. Apply daily over previous coat. After seven (7) days, may remove with alcohol and continue cycle.  Dispense:  6.6 mL    Refill:  2   traMADol (ULTRAM) 50 MG tablet    Sig: Take 1 tablet (50 mg total) by mouth every 12 (twelve) hours as needed for severe pain.    Dispense:  30 tablet    Refill:  2   ALPRAZolam (XANAX) 0.25 MG tablet    Sig: Take 1/2-1 tablet once daily as needed for severe anxiety/ panic attack. May cause hallucinations/ falls    Dispense:  30 tablet    Refill:  0     Netanya Yazdani Hulen Skains, DO [facility] [phone]

## 2022-11-28 ENCOUNTER — Telehealth: Payer: Self-pay | Admitting: Family Medicine

## 2022-11-28 ENCOUNTER — Telehealth: Payer: Self-pay

## 2022-11-28 NOTE — Telephone Encounter (Signed)
Stacey Summers (Key: Real Cons) PA Case ID #: [id number] Rx #: [phone] Need Help? Call us at [phone] Status sent iconSent to Plan today Drug Ciclopirox 8% solution ePA cloud logo Form [facility] [employer] Medicare Electronic Prior Authorization Form 2017 NCPDP

## 2022-11-28 NOTE — Telephone Encounter (Signed)
Called patient to schedule Medicare Annual Wellness Visit (AWV). Left message for patient to call back and schedule Medicare Annual Wellness Visit (AWV).  Last date of AWV: 02/10/2022  Patient has [employer] ADVANTAGE (Calendar) can be scheduled sooner than  02/12/2023  Please schedule an appointment at any time with either Vernona Rieger or [place], [facility]. .  If any questions, please contact me at [phone].  Thank you,  Judeth Cornfield,  AMB Clinical Support [facility] AWV Program Direct Dial ??[phone]

## 2022-11-29 ENCOUNTER — Telehealth: Payer: Self-pay | Admitting: Family Medicine

## 2022-11-29 NOTE — Telephone Encounter (Signed)
Contacted Ling Lawrenz Havlin to schedule their annual wellness visit. Appointment made for 12/07/2022.    Thank you,  Judeth Cornfield,  AMB Clinical Support [facility] AWV Program Direct Dial ??[phone]

## 2022-12-01 LAB — TOXASSURE SELECT 13 (MW), URINE

## 2022-12-02 ENCOUNTER — Inpatient Hospital Stay: Payer: PPO

## 2022-12-06 NOTE — Progress Notes (Signed)
Stacey Summers    [id number]    1946-02-29  Primary Care Physician:Gottschalk, Rozell Searing, DO  Referring Physician: [name], DO [address] [place],  [place] [address]   Chief complaint:  NASH cirrhosis Chief Complaint  Patient presents with   Cirrhosis    Patient is here for f/u and denies any GI sx at this time.   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  She has worsening congestive heart failure, had increase in diuretics dose with improvement.  Initially she had worsening renal function, creatinine peaked at 2.0 has since improved to 1.2-1.3  I last saw her on 05-20-22. At that time, she did not have any complains of any GI symptoms. She had intermittent epsiodes of confusion, mood changes, and changes in sleep pattern.   She is accompanied by her daughter today.  Today, she reports feeling well overall. She states that the swelling in her legs are fine as long as she is off her legs and reports regular follow ups with her nephrologist. She states she can't stay on her legs for long period of times but reports being active in her church. We also reviewed and discussed her labs from her last visit with her nephrologist. Her daughter is concerned about her blood sugar and blood pressure readings.   she denies diarrhea, constipation, nausea, blood in stool, black stool, vomiting, abdominal pain, bloating, unintentional weight loss, reflux, dysphagia.  GI Hx:  She was evaluated by [facility] and [facility] transplant hepatology, not a candidate for liver transplant please see full consult notes in Care Everywhere for details   CT Abdomen w wo contrast 06-30-22 1. Coarse, nodular cirrhotic morphology of the liver. No suspicious liver lesion or contrast enhancement on this examination which is somewhat limited by breath motion artifact. 2. Very narrowed appearance of the portal vein,  suggestive of chronic thrombosis and/or partial cavernous transformation. 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 3. Trace perihepatic ascites. 4. Status post cholecystectomy. Unchanged postoperative biliary ductal dilatation.  US Abdomen Limited RUQ 09-06-21 No appreciable interval changes. The liver has a chronic nodular surface contour consistent with cirrhosis and a small cyst in the right lobe periphery but no significant focal abnormality. There is probable mild steatosis. Chronic post cholecystectomy common bile duct prominence is stable. No ascites or portal vein flow reversal is seen. The hepatic portal main vein is not dilated.  EGD 08/20/19: - Grade I and small (< 5 mm) esophageal varices. - No gross lesions in esophagus. - Portal hypertensive gastropathy. - Normal examined duodenum.   EGD 08/07/2018: - The Z-line was regular and was found 36 cm from the incisors. - No gross lesions were noted in the entire esophagus. No esophageal varices. - The stomach was normal. No gastric varices. - The examined duodenum was normal.   Colonoscopy 08/07/2018 - Four 4 to 9 mm polyps in the transverse colon and in the ascending colon, removed with a cold snare. Resected and retrieved. - Diverticulosis in the sigmoid colon. - Non-bleeding internal hemorrhoids.   Relevant history: History of COVID-19 infection November 2020.  Worsening mental status with confusion and somnolence post Covid.   She has prominent splenorenal shunt, was referred to IR to discuss possible embolization.  But given she has significant improvement of symptoms with Xifaxan twice daily, family and patient preferred to hold off the embolization.    Current  Outpatient Medications:    albuterol (VENTOLIN HFA) 108 (90 Base) MCG/ACT inhaler, Inhale 2 puffs into the lungs every 6 (six) hours as needed for wheezing or shortness of breath., Disp: 18 g, Rfl: 0   ALPRAZolam (XANAX) 0.25 MG tablet, Take 1/2-1  tablet once daily as needed for severe anxiety/ panic attack. May cause hallucinations/ falls, Disp: 30 tablet, Rfl: 0   augmented betamethasone dipropionate (DIPROLENE-AF) 0.05 % cream, Apply topically., Disp: , Rfl:    benazepril (LOTENSIN) 20 MG tablet, Take 1 tablet (20 mg total) by mouth daily., Disp: 30 tablet, Rfl: 1   Biotin 5000 MCG SUBL, Place under the tongue., Disp: , Rfl:    blood glucose meter kit and supplies KIT, Dispense based on patient and insurance preference. Use up to four times daily as directed. (FOR ICD-9 250.00, 250.01)., Disp: 1 each, Rfl: 10   Blood Glucose Monitoring Suppl (ONETOUCH VERIO FLEX SYSTEM) w/Device KIT, Check Bs up to 4 times daily Dx E11.9,, Disp: 1 kit, Rfl: 0   Cholecalciferol (VITAMIN D3) 5000 units CAPS, Take 5,000 Units by mouth daily., Disp: , Rfl:    ciclopirox (PENLAC) 8 % solution, Apply topically at bedtime. Apply over nail and surrounding skin. Apply daily over previous coat. After seven (7) days, may remove with alcohol and continue cycle., Disp: 6.6 mL, Rfl: 2   cyclobenzaprine (FLEXERIL) 5 MG tablet, TAKE 1 TABLET BY MOUTH THREE TIMES DAILY AS NEEDED FOR MUSCLE SPASM (NEEDS  APPOINTMENT), Disp: 30 tablet, Rfl: 0   diltiazem (CARDIZEM CD) 120 MG 24 hr capsule, Take 1 capsule by mouth once daily, Disp: 90 capsule, Rfl: 3   fluticasone (FLONASE) 50 MCG/ACT nasal spray, Place 2 sprays into both nostrils daily., Disp: 16 g, Rfl: 6   furosemide (LASIX) 40 MG tablet, Take 60 mg once daily (one and a half tablets). For a weight of 250 and higher, take 80 mg in the morning and 40 mg in the late afternoon, Disp: 135 tablet, Rfl: 1   glucose blood (ONETOUCH VERIO) test strip, CHECK GLUCOSE UP TO 4 TIMES DAILY Dx E11.69, Disp: 400 each, Rfl: 3   lactulose (CHRONULAC) 10 GM/15ML solution, TAKE 15 ML BY MOUTH  THREE TIMES DAILY AS NEEDED, Disp: 946 mL, Rfl: 0   meclizine (ANTIVERT) 25 MG tablet, TAKE 1 TABLET BY MOUTH THREE TIMES DAILY AS NEEDED FOR DIZZINESS,  Disp: 90 tablet, Rfl: 1   ondansetron (ZOFRAN-ODT) 4 MG disintegrating tablet, Take 1 tablet (4 mg total) by mouth every 6 (six) hours as needed for nausea or vomiting., Disp: 30 tablet, Rfl: 0   ONETOUCH DELICA LANCETS 33G MISC, USE ONE LANCET TO CHECK GLUCOSE ONCE DAILY AND AS NEEDED, Disp: 100 each, Rfl: 0   rifAXIMin (XIFAXAN PO), Take 500 mg by mouth 2 (two) times daily., Disp: , Rfl:    spironolactone (ALDACTONE) 25 MG tablet, Take 1 tablet (25 mg total) by mouth daily., Disp: 90 tablet, Rfl: 3   traMADol (ULTRAM) 50 MG tablet, Take 1 tablet (50 mg total) by mouth every 12 (twelve) hours as needed for severe pain., Disp: 30 tablet, Rfl: 2   XIFAXAN 550 MG TABS tablet, TAKE ONE TABLET BY MOUTH TWICE A DAY, Disp: 180 tablet, Rfl: 2    Allergies as of 12/08/2022 - Review Complete 11/25/2022  Allergen Reaction Noted   Latex  03/27/2018    Past Medical History:  Diagnosis Date   Allergy    seasonal   Anxiety    Arthritis  hands, back    B12 deficiency 03/31/2021   Cataract    surgery to remove   Chronic back pain    lower back    Cirrhosis of liver without ascites    recent dx 07/11/18   Closed fracture of right tibial plateau 01/31/2018   Complex tear of medial meniscus of right knee 03/27/2018   COPD (chronic obstructive pulmonary disease)    no inhaler   Diabetes mellitus without complication    DJD (degenerative joint disease)    Fibromyalgia    Genetic testing 03/08/2017   Ms. Trumbo underwent genetic counseling and testing for hereditary cancer syndromes on 02/21/2017. Her results were negative for mutations in all 83 genes analyzed by [facility]'s 83-gene Multi-Cancers Panel. 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   History of  abnormal mammogram    History of arthroplasty of right knee 07/06/2018   Hyperlipidemia    Hypertension    Irregular heart beat    Neuromuscular disorder    fibromyalgia   Sleep apnea    cpap some   Stress fracture of right tibia 03/27/2018   SVD (spontaneous vaginal delivery)    x 2   Tubular adenoma of colon 05/09/12   "innumerable number" of flat polyps    Past Surgical History:  Procedure Laterality Date   CARPAL TUNNEL RELEASE Left 10/20/14   [facility]   CATARACT EXTRACTION, BILATERAL     CESAREAN SECTION  1977   CHOLECYSTECTOMY  1990   COLONOSCOPY     IR RADIOLOGIST EVAL & MGMT  07/16/2019   KNEE ARTHROSCOPY WITH SUBCHONDROPLASTY Right 03/27/2018   Procedure: Right knee arthroscopic partial medial meniscus repair versus menisectomy with medial tibia subchondroplasty;  Surgeon: Yolonda Kida, MD;  Location: [facility] OR;  Service: Orthopedics;  Laterality: Right;  120 mins   LARYNX SURGERY     POLYP REMOVAL   MULTIPLE TOOTH EXTRACTIONS     POLYPECTOMY     vocal cord   TONSILLECTOMY  1965   TUBAL LIGATION     TUMOR REMOVAL     benign fatty tumors on abdomen   UPPER GASTROINTESTINAL ENDOSCOPY      Family History  Problem Relation Age of Onset   Heart failure Mother        d.37   Heart disease Mother    Prostate cancer Father        d.89 diagnosed in late-60s   Colon cancer Father        diagnosed at 26   Leukemia Maternal Aunt    Heart failure Maternal Uncle        d.50   Stomach cancer Paternal Aunt        questionable   Colon cancer Paternal Uncle        in his 49s   Heart failure Maternal Grandmother        d.45   Kidney cancer Cousin        d.78s maternal first-cousin   Colon polyps Brother        polyposis. Underwent partial colectomy at age 22.   Spina bifida Brother        died at birth   Colon polyps Son 3   Heart attack Son 87   Other Other 16  multiple lipomas   Esophageal cancer Neg Hx    Rectal cancer Neg Hx    Breast cancer Neg Hx      Social History   Socioeconomic History   Marital status: Married    Spouse name: Molly Maduro "goes by [employer]"   Number of children: 3   Years of education: 11   Highest education level: GED or equivalent  Occupational History   Occupation: retired  Tobacco Use   Smoking status: Former    Packs/day: 1.00    Years: 3.00    Additional pack years: 0.00    Total pack years: 3.00    Types: Cigarettes    Quit date: 08/15/1969    Years since quitting: 53.3   Smokeless tobacco: Never  Vaping Use   Vaping Use: Never used  Substance and Sexual Activity   Alcohol use: No    Alcohol/week: 0.0 standard drinks of alcohol   Drug use: No   Sexual activity: Not Currently  Other Topics Concern   Not on file  Social History Narrative   Daughter lives with them and helps a lot   Social Determinants of Health   Financial Resource Strain: Low Risk  (02/10/2022)   Overall Financial Resource Strain (CARDIA)    Difficulty of Paying Living Expenses: Not hard at all  Food Insecurity: No Food Insecurity (02/10/2022)   Hunger Vital Sign    Worried About Running Out of Food in the Last Year: Never true    Ran Out of Food in the Last Year: Never true  Transportation Needs: No Transportation Needs (02/10/2022)   PRAPARE - Administrator, Civil Service (Medical): No    Lack of Transportation (Non-Medical): No  Physical Activity: Inactive (02/10/2022)   Exercise Vital Sign    Days of Exercise per Week: 0 days    Minutes of Exercise per Session: 0 min  Stress: No Stress Concern Present (02/10/2022)   [employer] of Occupational Health - Occupational Stress Questionnaire    Feeling of Stress : Only a little  Social Connections: Socially Integrated (02/10/2022)   Social Connection and Isolation Panel [NHANES]    Frequency of Communication with Friends and Family: More than three times a week    Frequency of Social Gatherings with Friends and Family: More than three times a week    Attends  Religious Services: More than 4 times per year    Active Member of [employer] or Organizations: Yes    Attends Engineer, structural: More than 4 times per year    Marital Status: Married  Catering manager Violence: Not At Risk (02/10/2022)   Humiliation, Afraid, Rape, and Kick questionnaire    Fear of Current or Ex-Partner: No    Emotionally Abused: No    Physically Abused: No    Sexually Abused: No    Review of systems: Review of Systems  Constitutional:  Negative for appetite change and unexpected weight change.  HENT:  Negative for trouble swallowing.   Gastrointestinal:  Negative for abdominal distention, abdominal pain, anal bleeding, blood in stool, constipation, diarrhea, nausea, rectal pain and vomiting.     Physical Exam: General: well-appearing   Eyes: sclera anicteric, no redness ENT: oral mucosa moist without lesions, no cervical or supraclavicular lymphadenopathy CV: RRR, no JVD, no peripheral edema. Systolic murmur.  Resp: clear to auscultation bilaterally, normal RR and effort noted GI: soft, no tenderness, with active bowel sounds. No guarding or palpable organomegaly noted. Skin; warm and dry, no rash or  jaundice noted Neuro: awake, alert and oriented x 3. Normal gross motor function and fluent speech    Data Reviewed:  Reviewed labs, radiology imaging, old records and pertinent past GI work up   Assessment and Plan/Recommendations:  78 year old very pleasant with history of Stacey Summers cirrhosis decompensated with hepatic encephalopathy Worsening renal function, CKD stage III  MELD 3.0: 15 at 05/20/2022 12:25 PM MELD-Na: 13 at 05/20/2022 12:25 PM   Fluid overload secondary to to [place]art cirrhosis, and CHF: Diuretics managed by cardiology and nephrology, she also has chronic kidney disease  History of advanced adenomatous colon polyps Discussed with patient that given her age and comorbidities with cirrhosis, thrombocytopenia and coagulopathy, is at significant  risk for complications associated with procedure and anesthesia.  Do not recommend ongoing surveillance colonoscopy due to her age and comorbidities   Chronic fatigue secondary to comorbid conditions including cirrhosis Vitamin D and B12 level within normal range   Hepatocellular screening: No hepatic lesions concerning for [facility] on imaging January 2023.  AFP within normal range   Chronic thrombocytopenia: Secondary to portal hypertension in the setting of [place] cirrhosis   Hepatic encephalopathy: Continue Xifaxan twice daily and lactulose as needed with goal 3 soft bowel movements daily   Small esophageal varices on EGD in January 2021  Hepatocellular carcinoma screening: Mild elevation AFP, follow-up CT liver negative for any lesions Follow-up AFP and INR   Continue with low-carb and low-fat diet, avoid artificial sweeteners, high fructose corn syrup. Daily exercise as tolerated   Return in 4 months   The patient was provided an opportunity to ask questions and all were answered. The patient agreed with the plan and demonstrated an understanding of the instructions.  Iona Beard , MD    CC: [name], DO  I,Safa M Kadhim,acting as a scribe for Marsa Aris, MD.,have documented all relevant documentation on the behalf of Marsa Aris, MD,as directed by  Marsa Aris, MD while in the presence of Marsa Aris, MD.   I, Marsa Aris, MD, have reviewed all documentation for this visit. The documentation on 12/06/22 for the exam, diagnosis, procedures, and orders are all accurate and complete.

## 2022-12-07 ENCOUNTER — Inpatient Hospital Stay: Payer: PPO | Attending: Hematology

## 2022-12-07 ENCOUNTER — Other Ambulatory Visit: Payer: Self-pay | Admitting: Family Medicine

## 2022-12-07 VITALS — BP 124/31 | HR 53 | Temp 97.7°F | Resp 16

## 2022-12-07 DIAGNOSIS — E1159 Type 2 diabetes mellitus with other circulatory complications: Secondary | ICD-10-CM

## 2022-12-07 DIAGNOSIS — E538 Deficiency of other specified B group vitamins: Secondary | ICD-10-CM | POA: Insufficient documentation

## 2022-12-07 MED ORDER — CYANOCOBALAMIN 1000 MCG/ML IJ SOLN
1000.0000 ug | Freq: Once | INTRAMUSCULAR | Status: AC
  Administered 2022-12-07: 1000 ug via INTRAMUSCULAR
  Filled 2022-12-07: qty 1

## 2022-12-07 NOTE — Telephone Encounter (Signed)
Patient is calling because [employer] sent her a letter stating they denied Ciclopirox.

## 2022-12-07 NOTE — Patient Instructions (Signed)
[facility] [place]  Discharge Instructions: Thank you for choosing [facility] to provide your oncology and hematology care.  If you have a lab appointment with the Cancer Center - please note that after April 8th, 2024, all labs will be drawn in the cancer center.  You do not have to check in or register with the main entrance as you have in the past but will complete your check-in in the cancer center.  Wear comfortable clothing and clothing appropriate for easy access to any Portacath or PICC line.   We strive to give you quality time with your provider. You may need to reschedule your appointment if you arrive late (15 or more minutes).  Arriving late affects you and other patients whose appointments are after yours.  Also, if you miss three or more appointments without notifying the office, you may be dismissed from the clinic at the provider's discretion.      For prescription refill requests, have your pharmacy contact our office and allow 72 hours for refills to be completed.    Today you received the following B12 injection, return as scheduled.   To help prevent nausea and vomiting after your treatment, we encourage you to take your nausea medication as directed.  BELOW ARE SYMPTOMS THAT SHOULD BE REPORTED IMMEDIATELY: *FEVER GREATER THAN 100.4 F (38 C) OR HIGHER *CHILLS OR SWEATING *NAUSEA AND VOMITING THAT IS NOT CONTROLLED WITH YOUR NAUSEA MEDICATION *UNUSUAL SHORTNESS OF BREATH *UNUSUAL BRUISING OR BLEEDING *URINARY PROBLEMS (pain or burning when urinating, or frequent urination) *BOWEL PROBLEMS (unusual diarrhea, constipation, pain near the anus) TENDERNESS IN MOUTH AND THROAT WITH OR WITHOUT PRESENCE OF ULCERS (sore throat, sores in mouth, or a toothache) UNUSUAL RASH, SWELLING OR PAIN  UNUSUAL VAGINAL DISCHARGE OR ITCHING   Items with * indicate a potential emergency and should be followed up as soon as possible or go to the Emergency  Department if any problems should occur.  Please show the CHEMOTHERAPY ALERT CARD or IMMUNOTHERAPY ALERT CARD at check-in to the Emergency Department and triage nurse.  Should you have questions after your visit or need to cancel or reschedule your appointment, please contact [facility] [place] [phone]  and follow the prompts.  Office hours are 8:00 a.m. to 4:30 p.m. Monday - Friday. Please note that voicemails left after 4:00 p.m. may not be returned until the following business day.  We are closed weekends and major holidays. You have access to a nurse at all times for urgent questions. Please call the main number to the clinic [phone] and follow the prompts.  For any non-urgent questions, you may also contact your provider using MyChart. We now offer e-Visits for anyone 18 and older to request care online for non-urgent symptoms. For details visit [email or web address].   Also download the MyChart app! Go to the app store, search "MyChart", open the app, select [facility], and log in with your MyChart username and password.   

## 2022-12-08 ENCOUNTER — Other Ambulatory Visit: Payer: Self-pay | Admitting: Family Medicine

## 2022-12-08 ENCOUNTER — Other Ambulatory Visit (INDEPENDENT_AMBULATORY_CARE_PROVIDER_SITE_OTHER): Payer: PPO

## 2022-12-08 ENCOUNTER — Ambulatory Visit (INDEPENDENT_AMBULATORY_CARE_PROVIDER_SITE_OTHER): Payer: PPO | Admitting: Gastroenterology

## 2022-12-08 ENCOUNTER — Encounter: Payer: Self-pay | Admitting: Gastroenterology

## 2022-12-08 VITALS — BP 98/52 | HR 46 | Ht 61.0 in | Wt 225.0 lb

## 2022-12-08 DIAGNOSIS — N183 Chronic kidney disease, stage 3 unspecified: Secondary | ICD-10-CM

## 2022-12-08 DIAGNOSIS — K7460 Unspecified cirrhosis of liver: Secondary | ICD-10-CM

## 2022-12-08 DIAGNOSIS — R600 Localized edema: Secondary | ICD-10-CM

## 2022-12-08 DIAGNOSIS — K7581 Nonalcoholic steatohepatitis (NASH): Secondary | ICD-10-CM

## 2022-12-08 LAB — HEPATIC FUNCTION PANEL
ALT: 21 U/L (ref 0–35)
AST: 44 U/L — ABNORMAL HIGH (ref 0–37)
Albumin: 3.1 g/dL — ABNORMAL LOW (ref 3.5–5.2)
Alkaline Phosphatase: 83 U/L (ref 39–117)
Bilirubin, Direct: 0.8 mg/dL — ABNORMAL HIGH (ref 0.0–0.3)
Total Bilirubin: 3.5 mg/dL — ABNORMAL HIGH (ref 0.2–1.2)
Total Protein: 5.6 g/dL — ABNORMAL LOW (ref 6.0–8.3)

## 2022-12-08 LAB — PROTIME-INR
INR: 1.4 ratio — ABNORMAL HIGH (ref 0.8–1.0)
Prothrombin Time: 15.1 s — ABNORMAL HIGH (ref 9.6–13.1)

## 2022-12-08 NOTE — Telephone Encounter (Signed)
Please clarify with pt. THIS ISN'T ON HER MED LIST

## 2022-12-08 NOTE — Telephone Encounter (Signed)
Will have to pay cash or check with Dr. Reece Agar next week to see if she wants to use an alternative.

## 2022-12-08 NOTE — Patient Instructions (Signed)
Your provider has requested that you go to the basement level for lab work before leaving today. Press "B" on the elevator. The lab is located at the first door on the left as you exit the elevator.   Due to recent changes in healthcare laws, you may see the results of your imaging and laboratory studies on MyChart before your provider has had a chance to review them.  We understand that in some cases there may be results that are confusing or concerning to you. Not all laboratory results come back in the same time frame and the provider may be waiting for multiple results in order to interpret others.  Please give [place] 48 hours in order for your provider to thoroughly review all the results before contacting the office for clarification of your results.    _______________________________________________________  If your blood pressure at your visit was 140/90 or greater, please contact your primary care physician to follow up on this.  _______________________________________________________  If you are age 75 or older, your body mass index should be between 23-30. Your Body mass index is 42.51 kg/m. If this is out of the aforementioned range listed, please consider follow up with your Primary Care Provider.  If you are age 41 or younger, your body mass index should be between 19-25. Your Body mass index is 42.51 kg/m. If this is out of the aformentioned range listed, please consider follow up with your Primary Care Provider.   ________________________________________________________  The [facility] providers would like to encourage you to use [facility] to communicate with providers for non-urgent requests or questions.  Due to long hold times on the telephone, sending your provider a message by [facility] may be a faster and more efficient way to get a response.  Please allow 48 business hours for a response.  Please remember that this is for non-urgent requests.   _______________________________________________________   I appreciate the  opportunity to care for you  Thank You   Marsa Aris , MD

## 2022-12-08 NOTE — Telephone Encounter (Signed)
Apply topically at bedtime. Apply over nail and surrounding skin. Apply daily over previous coat. After seven (7) days, may remove with alcohol and continue cycle. Dispense: 6.6 mL, Refills: 2 ordered  11/25/2022 12/08/2022

## 2022-12-09 ENCOUNTER — Encounter: Payer: Self-pay | Admitting: Gastroenterology

## 2022-12-12 LAB — AFP TUMOR MARKER: AFP-Tumor Marker: 4.2 ng/mL

## 2022-12-12 NOTE — Telephone Encounter (Signed)
I think this is like $24 out of pocket.  She cannot take the oral due to cirrhosis of the liver.  If it's affordable, recommend getting it off insurance.

## 2022-12-13 ENCOUNTER — Other Ambulatory Visit: Payer: Self-pay | Admitting: Family Medicine

## 2022-12-13 DIAGNOSIS — Z1231 Encounter for screening mammogram for malignant neoplasm of breast: Secondary | ICD-10-CM

## 2022-12-15 DIAGNOSIS — D731 Hypersplenism: Secondary | ICD-10-CM | POA: Diagnosis not present

## 2022-12-15 DIAGNOSIS — D6959 Other secondary thrombocytopenia: Secondary | ICD-10-CM | POA: Diagnosis not present

## 2022-12-15 DIAGNOSIS — E1122 Type 2 diabetes mellitus with diabetic chronic kidney disease: Secondary | ICD-10-CM | POA: Diagnosis not present

## 2022-12-15 DIAGNOSIS — N189 Chronic kidney disease, unspecified: Secondary | ICD-10-CM | POA: Diagnosis not present

## 2022-12-16 ENCOUNTER — Ambulatory Visit (INDEPENDENT_AMBULATORY_CARE_PROVIDER_SITE_OTHER): Payer: PPO

## 2022-12-16 ENCOUNTER — Encounter: Payer: Self-pay | Admitting: *Deleted

## 2022-12-16 ENCOUNTER — Telehealth: Payer: Self-pay | Admitting: Family Medicine

## 2022-12-16 VITALS — Ht 61.0 in | Wt 225.0 lb

## 2022-12-16 DIAGNOSIS — Z780 Asymptomatic menopausal state: Secondary | ICD-10-CM

## 2022-12-16 DIAGNOSIS — Z0000 Encounter for general adult medical examination without abnormal findings: Secondary | ICD-10-CM | POA: Diagnosis not present

## 2022-12-16 NOTE — Progress Notes (Signed)
Called to offer [facility] retinal screening appt for 02/01/2023 at [facility] LM for pt to return call

## 2022-12-16 NOTE — Telephone Encounter (Signed)
Spoke with Stacey Summers , she is calling the pt to set up a apt

## 2022-12-16 NOTE — Patient Instructions (Signed)
Stacey Summers , Thank you for taking time to come for your Medicare Wellness Visit. I appreciate your ongoing commitment to your health goals. Please review the following plan we discussed and let me know if I can assist you in the future.   These are the goals we discussed:  Goals       DIET - INCREASE WATER INTAKE      Try to drink 6-8 glasses of water daily      Patient Stated (pt-stated)      Pt states " I would like to get to walking better"        This is a list of the screening recommended for you and due dates:  Health Maintenance  Topic Date Due   Yearly kidney health urinalysis for diabetes  09/20/2016   DTaP/Tdap/Td vaccine (2 - Td or Tdap) 07/08/2019   Colon Cancer Screening  08/07/2021   Eye exam for diabetics  11/03/2022   Mammogram  12/16/2022   DEXA scan (bone density measurement)  11/25/2023*   Flu Shot  03/16/2023   Hemoglobin A1C  05/27/2023   Yearly kidney function blood test for diabetes  09/29/2023   Complete foot exam   11/25/2023   Medicare Annual Wellness Visit  12/16/2023   Pneumonia Vaccine  Completed   Hepatitis C Screening: USPSTF Recommendation to screen - Ages 18-79 yo.  Completed   Zoster (Shingles) Vaccine  Completed   HPV Vaccine  Aged Out   COVID-19 Vaccine  Discontinued  *Topic was postponed. The date shown is not the original due date.    Advanced directives: Advance directive discussed with you today. I have provided a copy for you to complete at home and have notarized. Once this is complete please bring a copy in to our office so we can scan it into your chart.   Conditions/risks identified: Aim for 30 minutes of exercise or brisk walking, 6-8 glasses of water, and 5 servings of fruits and vegetables each day.   Next appointment: Follow up in one year for your annual wellness visit    Preventive Care 65 Years and Older, Female Preventive care refers to lifestyle choices and visits with your health care provider that can promote  health and wellness. What does preventive care include? A yearly physical exam. This is also called an annual well check. Dental exams once or twice a year. Routine eye exams. Ask your health care provider how often you should have your eyes checked. Personal lifestyle choices, including: Daily care of your teeth and gums. Regular physical activity. Eating a healthy diet. Avoiding tobacco and drug use. Limiting alcohol use. Practicing safe sex. Taking low-dose aspirin every day. Taking vitamin and mineral supplements as recommended by your health care provider. What happens during an annual well check? The services and screenings done by your health care provider during your annual well check will depend on your age, overall health, lifestyle risk factors, and family history of disease. Counseling  Your health care provider may ask you questions about your: Alcohol use. Tobacco use. Drug use. Emotional well-being. Home and relationship well-being. Sexual activity. Eating habits. History of falls. Memory and ability to understand (cognition). Work and work Astronomer. Reproductive health. Screening  You may have the following tests or measurements: Height, weight, and BMI. Blood pressure. Lipid and cholesterol levels. These may be checked every 5 years, or more frequently if you are over 29 years old. Skin check. Lung cancer screening. You may have this screening every year  starting at age 4 if you have a 30-pack-year history of smoking and currently smoke or have quit within the past 15 years. Fecal occult blood test (FOBT) of the stool. You may have this test every year starting at age 3. Flexible sigmoidoscopy or colonoscopy. You may have a sigmoidoscopy every 5 years or a colonoscopy every 10 years starting at age 62. Hepatitis C blood test. Hepatitis B blood test. Sexually transmitted disease (STD) testing. Diabetes screening. This is done by checking your blood sugar  (glucose) after you have not eaten for a while (fasting). You may have this done every 1-3 years. Bone density scan. This is done to screen for osteoporosis. You may have this done starting at age 4. Mammogram. This may be done every 1-2 years. Talk to your health care provider about how often you should have regular mammograms. Talk with your health care provider about your test results, treatment options, and if necessary, the need for more tests. Vaccines  Your health care provider may recommend certain vaccines, such as: Influenza vaccine. This is recommended every year. Tetanus, diphtheria, and acellular pertussis (Tdap, Td) vaccine. You may need a Td booster every 10 years. Zoster vaccine. You may need this after age 53. Pneumococcal 13-valent conjugate (PCV13) vaccine. One dose is recommended after age 25. Pneumococcal polysaccharide (PPSV23) vaccine. One dose is recommended after age 30. Talk to your health care provider about which screenings and vaccines you need and how often you need them. This information is not intended to replace advice given to you by your health care provider. Make sure you discuss any questions you have with your health care provider. Document Released: 08/28/2015 Document Revised: 04/20/2016 Document Reviewed: 06/02/2015 Elsevier Interactive Patient Education  2017 [employer].  Fall Prevention in the Home Falls can cause injuries. They can happen to people of all ages. There are many things you can do to make your home safe and to help prevent falls. What can I do on the outside of my home? Regularly fix the edges of walkways and driveways and fix any cracks. Remove anything that might make you trip as you walk through a door, such as a raised step or threshold. Trim any bushes or trees on the path to your home. Use bright outdoor lighting. Clear any walking paths of anything that might make someone trip, such as rocks or tools. Regularly check to see  if handrails are loose or broken. Make sure that both sides of any steps have handrails. Any raised decks and porches should have guardrails on the edges. Have any leaves, snow, or ice cleared regularly. Use sand or salt on walking paths during winter. Clean up any spills in your garage right away. This includes oil or grease spills. What can I do in the bathroom? Use night lights. Install grab bars by the toilet and in the tub and shower. Do not use towel bars as grab bars. Use non-skid mats or decals in the tub or shower. If you need to sit down in the shower, use a plastic, non-slip stool. Keep the floor dry. Clean up any water that spills on the floor as soon as it happens. Remove soap buildup in the tub or shower regularly. Attach bath mats securely with double-sided non-slip rug tape. Do not have throw rugs and other things on the floor that can make you trip. What can I do in the bedroom? Use night lights. Make sure that you have a light by your bed that is  easy to reach. Do not use any sheets or blankets that are too big for your bed. They should not hang down onto the floor. Have a firm chair that has side arms. You can use this for support while you get dressed. Do not have throw rugs and other things on the floor that can make you trip. What can I do in the kitchen? Clean up any spills right away. Avoid walking on wet floors. Keep items that you use a lot in easy-to-reach places. If you need to reach something above you, use a strong step stool that has a grab bar. Keep electrical cords out of the way. Do not use floor polish or wax that makes floors slippery. If you must use wax, use non-skid floor wax. Do not have throw rugs and other things on the floor that can make you trip. What can I do with my stairs? Do not leave any items on the stairs. Make sure that there are handrails on both sides of the stairs and use them. Fix handrails that are broken or loose. Make sure that  handrails are as long as the stairways. Check any carpeting to make sure that it is firmly attached to the stairs. Fix any carpet that is loose or worn. Avoid having throw rugs at the top or bottom of the stairs. If you do have throw rugs, attach them to the floor with carpet tape. Make sure that you have a light switch at the top of the stairs and the bottom of the stairs. If you do not have them, ask someone to add them for you. What else can I do to help prevent falls? Wear shoes that: Do not have high heels. Have rubber bottoms. Are comfortable and fit you well. Are closed at the toe. Do not wear sandals. If you use a stepladder: Make sure that it is fully opened. Do not climb a closed stepladder. Make sure that both sides of the stepladder are locked into place. Ask someone to hold it for you, if possible. Clearly mark and make sure that you can see: Any grab bars or handrails. First and last steps. Where the edge of each step is. Use tools that help you move around (mobility aids) if they are needed. These include: Canes. Walkers. Scooters. Crutches. Turn on the lights when you go into a dark area. Replace any light bulbs as soon as they burn out. Set up your furniture so you have a clear path. Avoid moving your furniture around. If any of your floors are uneven, fix them. If there are any pets around you, be aware of where they are. Review your medicines with your doctor. Some medicines can make you feel dizzy. This can increase your chance of falling. Ask your doctor what other things that you can do to help prevent falls. This information is not intended to replace advice given to you by your health care provider. Make sure you discuss any questions you have with your health care provider. Document Released: 05/28/2009 Document Revised: 01/07/2016 Document Reviewed: 09/05/2014 Elsevier Interactive Patient Education  2017 [employer].

## 2022-12-16 NOTE — Progress Notes (Signed)
Subjective:   Stacey Summers is a 77 y.o. female who presents for Medicare Annual (Subsequent) preventive examination. I connected with  Stacey Summers on 12/16/22 by a audio enabled telemedicine application and verified that I am speaking with the correct person using two identifiers.  Patient Location: Home  Provider Location: Home Office  I discussed the limitations of evaluation and management by telemedicine. The patient expressed understanding and agreed to proceed.  Review of Systems     Cardiac Risk Factors include: advanced age (>27men, >30 women);dyslipidemia;hypertension;diabetes mellitus     Objective:    Today's Vitals   12/16/22 1318  Weight: 225 lb (102.1 kg)  Height: 5\' 1"  (1.549 m)   Body mass index is 42.51 kg/m.     12/16/2022    1:23 PM 12/07/2022    2:41 PM 11/09/2022    1:09 PM 10/03/2022    2:49 PM 09/01/2022    3:20 PM 09/01/2022    3:09 PM 08/03/2022    1:00 PM  Advanced Directives  Does Patient Have a Medical Advance Directive? No No No No No No No  Would patient like information on creating a medical advance directive? No - Patient declined No - Patient declined No - Patient declined No - Patient declined No - Patient declined No - Patient declined No - Patient declined    Current Medications (verified) Outpatient Encounter Medications as of 12/16/2022  Medication Sig   albuterol (VENTOLIN HFA) 108 (90 Base) MCG/ACT inhaler Inhale 2 puffs into the lungs every 6 (six) hours as needed for wheezing or shortness of breath.   ALPRAZolam (XANAX) 0.25 MG tablet Take 1/2-1 tablet once daily as needed for severe anxiety/ panic attack. May cause hallucinations/ falls   augmented betamethasone dipropionate (DIPROLENE-AF) 0.05 % cream Apply topically.   benazepril (LOTENSIN) 20 MG tablet Take 1 tablet by mouth once daily   Biotin 5000 MCG SUBL Place under the tongue.   blood glucose meter kit and supplies KIT Dispense based on patient and insurance  preference. Use up to four times daily as directed. (FOR ICD-9 250.00, 250.01).   Blood Glucose Monitoring Suppl (ONETOUCH VERIO FLEX SYSTEM) w/Device KIT Check Bs up to 4 times daily Dx E11.9,   Cholecalciferol (VITAMIN D3) 5000 units CAPS Take 5,000 Units by mouth daily.   cyclobenzaprine (FLEXERIL) 5 MG tablet TAKE 1 TABLET BY MOUTH THREE TIMES DAILY AS NEEDED FOR MUSCLE SPASM (NEEDS  APPOINTMENT)   diltiazem (CARDIZEM CD) 120 MG 24 hr capsule Take 1 capsule by mouth once daily   fluticasone (FLONASE) 50 MCG/ACT nasal spray Place 2 sprays into both nostrils daily.   furosemide (LASIX) 40 MG tablet Take 60 mg once daily (one and a half tablets). For a weight of 250 and higher, take 80 mg in the morning and 40 mg in the late afternoon   glucose blood (ONETOUCH VERIO) test strip CHECK GLUCOSE UP TO 4 TIMES DAILY Dx E11.69   lactulose (CHRONULAC) 10 GM/15ML solution TAKE 15 ML BY MOUTH  THREE TIMES DAILY AS NEEDED   meclizine (ANTIVERT) 25 MG tablet TAKE 1 TABLET BY MOUTH THREE TIMES DAILY AS NEEDED FOR DIZZINESS   ondansetron (ZOFRAN-ODT) 4 MG disintegrating tablet Take 1 tablet (4 mg total) by mouth every 6 (six) hours as needed for nausea or vomiting.   ONETOUCH DELICA LANCETS 33G MISC USE ONE LANCET TO CHECK GLUCOSE ONCE DAILY AND AS NEEDED   rifAXIMin (XIFAXAN PO) Take 500 mg by mouth 2 (two) times daily.  spironolactone (ALDACTONE) 25 MG tablet Take 1 tablet (25 mg total) by mouth daily.   traMADol (ULTRAM) 50 MG tablet Take 1 tablet (50 mg total) by mouth every 12 (twelve) hours as needed for severe pain.   XIFAXAN 550 MG TABS tablet TAKE ONE TABLET BY MOUTH TWICE A DAY   No facility-administered encounter medications on file as of 12/16/2022.    Allergies (verified) Latex   History: Past Medical History:  Diagnosis Date   Allergy    seasonal   Anxiety    Arthritis    hands, back    B12 deficiency 03/31/2021   Cataract    surgery to remove   Chronic back pain    lower back     Cirrhosis of liver without ascites (HCC)    recent dx 07/11/18   Closed fracture of right tibial plateau 01/31/2018   Complex tear of medial meniscus of right knee 03/27/2018   COPD (chronic obstructive pulmonary disease) (HCC)    no inhaler   Diabetes mellitus without complication (HCC)    DJD (degenerative joint disease)    Fibromyalgia    Genetic testing 03/08/2017   Ms. Lipson underwent genetic counseling and testing for hereditary cancer syndromes on 02/21/2017. Her results were negative for mutations in all 83 genes analyzed by [facility]'s 83-gene Multi-Cancers Panel. 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   History of abnormal mammogram    History of arthroplasty of right knee 07/06/2018   Hyperlipidemia    Hypertension    Irregular heart beat    Neuromuscular disorder (HCC)    fibromyalgia   Sleep apnea    cpap some   Stress fracture of right tibia 03/27/2018   SVD (spontaneous vaginal delivery)    x 2   Tubular adenoma of colon 05/09/12   "innumerable number" of flat polyps   Past Surgical History:  Procedure Laterality Date   CARPAL TUNNEL RELEASE Left 10/20/14   [facility]   CATARACT EXTRACTION, BILATERAL     CESAREAN SECTION  1977   CHOLECYSTECTOMY  1990   COLONOSCOPY     IR RADIOLOGIST EVAL & MGMT  07/16/2019   KNEE ARTHROSCOPY WITH SUBCHONDROPLASTY Right 03/27/2018   Procedure: Right knee arthroscopic partial medial meniscus repair versus menisectomy with medial tibia subchondroplasty;  Surgeon: Yolonda Kida, MD;  Location: [facility];  Service: Orthopedics;  Laterality: Right;  120 mins   LARYNX SURGERY     POLYP REMOVAL   MULTIPLE TOOTH EXTRACTIONS     POLYPECTOMY     vocal cord   TONSILLECTOMY  1965   TUBAL LIGATION      TUMOR REMOVAL     benign fatty tumors on abdomen   UPPER GASTROINTESTINAL ENDOSCOPY     Family History  Problem Relation Age of Onset   Heart failure Mother        d.37   Heart disease Mother    Prostate cancer Father        d.89 diagnosed in late-60s   Colon cancer Father        diagnosed at 67   Leukemia Maternal Aunt    Heart failure Maternal Uncle        d.50   Stomach cancer Paternal Aunt  questionable   Colon cancer Paternal Uncle        in his 45s   Heart failure Maternal Grandmother        d.45   Kidney cancer Cousin        d.92s maternal first-cousin   Colon polyps Brother        polyposis. Underwent partial colectomy at age 25.   Spina bifida Brother        died at birth   Colon polyps Son 72   Heart attack Son 37   Other Other 16       multiple lipomas   Esophageal cancer Neg Hx    Rectal cancer Neg Hx    Breast cancer Neg Hx    Social History   Socioeconomic History   Marital status: Married    Spouse name: Molly Maduro "goes by [employer]"   Number of children: 3   Years of education: 11   Highest education level: GED or equivalent  Occupational History   Occupation: retired  Tobacco Use   Smoking status: Former    Packs/day: 1.00    Years: 3.00    Additional pack years: 0.00    Total pack years: 3.00    Types: Cigarettes    Quit date: 08/15/1969    Years since quitting: 53.3   Smokeless tobacco: Never  Vaping Use   Vaping Use: Never used  Substance and Sexual Activity   Alcohol use: No    Alcohol/week: 0.0 standard drinks of alcohol   Drug use: No   Sexual activity: Not Currently  Other Topics Concern   Not on file  Social History Narrative   Daughter lives with them and helps a lot   Social Determinants of Health   Financial Resource Strain: Low Risk  (12/16/2022)   Overall Financial Resource Strain (CARDIA)    Difficulty of Paying Living Expenses: Not hard at all  Food Insecurity: No Food Insecurity (12/16/2022)   Hunger Vital Sign     Worried About Running Out of Food in the Last Year: Never true    Ran Out of Food in the Last Year: Never true  Transportation Needs: No Transportation Needs (12/16/2022)   PRAPARE - Administrator, Civil Service (Medical): No    Lack of Transportation (Non-Medical): No  Physical Activity: Inactive (12/16/2022)   Exercise Vital Sign    Days of Exercise per Week: 0 days    Minutes of Exercise per Session: 0 min  Stress: No Stress Concern Present (12/16/2022)   [employer] of Occupational Health - Occupational Stress Questionnaire    Feeling of Stress : Not at all  Social Connections: Socially Integrated (12/16/2022)   Social Connection and Isolation Panel [NHANES]    Frequency of Communication with Friends and Family: More than three times a week    Frequency of Social Gatherings with Friends and Family: More than three times a week    Attends Religious Services: More than 4 times per year    Active Member of [employer] or Organizations: Yes    Attends Engineer, structural: More than 4 times per year    Marital Status: Married    Tobacco Counseling Counseling given: Not Answered   Clinical Intake:  Pre-visit preparation completed: Yes  Pain : No/denies pain     Nutritional Risks: None Diabetes: No  How often do you need to have someone help you when you read instructions, pamphlets, or other written materials from your doctor or pharmacy?: 1 -  Never  Diabetic?yes  Nutrition Risk Assessment:  Has the patient had any N/V/D within the last 2 months?  No  Does the patient have any non-healing wounds?  No  Has the patient had any unintentional weight loss or weight gain?  No   Diabetes:  Is the patient diabetic?  Yes  If diabetic, was a CBG obtained today?  No  Did the patient bring in their glucometer from home?  No  How often do you monitor your CBG's? Once a day .   Financial Strains and Diabetes Management:  Are you having any financial strains with  the device, your supplies or your medication? No .  Does the patient want to be seen by Chronic Care Management for management of their diabetes?  No  Would the patient like to be referred to a Nutritionist or for Diabetic Management?  No   Diabetic Exams:  Diabetic Eye Exam: Overdue for diabetic eye exam. Pt has been advised about the importance in completing this exam. Patient advised to call and schedule an eye exam. Diabetic Foot Exam: Overdue, Pt has been advised about the importance in completing this exam. Pt is scheduled for diabetic foot exam on unable to obtain .   Interpreter Needed?: No  Information entered by :: Renie Ora, LPN   Activities of Daily Living    12/16/2022    1:24 PM 02/10/2022    1:08 PM  In your present state of health, do you have any difficulty performing the following activities:  Hearing? 0 1  Vision? 0 0  Difficulty concentrating or making decisions? 0 0  Walking or climbing stairs? 0 1  Dressing or bathing? 0 1  Comment  mostly independent, but has a little asssitance  Doing errands, shopping? 0 1  Preparing Food and eating ? N Y  Using the Toilet? N N  In the past six months, have you accidently leaked urine? N N  Do you have problems with loss of bowel control? N N  Managing your Medications? N N  Managing your Finances? N N  Housekeeping or managing your Housekeeping? N Y    Patient Care Team: [name], DO as PCP - General (Family Medicine) Croitoru, Rachelle Hora, MD as PCP - Cardiology (Cardiology) Michele Mcalpine, MD as Consulting Physician (Pulmonary Disease) Napoleon Form, MD as Consulting Physician (Gastroenterology) Doreatha Massed, MD as Consulting Physician (Hematology) Delora Fuel, OD (Optometry) Randa Lynn, MD as Consulting Physician (Nephrology)  Indicate any recent Medical Services you may have received from other than [facility] providers in the past year (date may be approximate).     Assessment:    This is a routine wellness examination for Verleen.  Hearing/Vision screen Vision Screening - Comments:: Unknown per [name] patient Daughter   Dietary issues and exercise activities discussed: Current Exercise Habits: The patient does not participate in regular exercise at present, Exercise limited by: None identified   Goals Addressed             This Visit's Progress    DIET - INCREASE WATER INTAKE   On track    Try to drink 6-8 glasses of water daily       Depression Screen    12/16/2022    1:23 PM 11/25/2022    2:09 PM 11/02/2022   12:00 PM 03/01/2022    3:03 PM 02/10/2022    1:12 PM 12/01/2021   10:46 AM 09/13/2021   11:18 AM  PHQ 2/9 Scores  PHQ -  2 Score 0 0 2 0 0 0 1  PHQ- 9 Score 0 0 8 2 4  6     Fall Risk    12/16/2022    1:22 PM 11/25/2022    2:09 PM 03/01/2022    3:03 PM 02/10/2022    1:07 PM 12/01/2021   10:46 AM  Fall Risk   Falls in the past year? 0 0 0 0 0  Number falls in past yr: 0 0  0   Injury with Fall? 0 0  0   Risk for fall due to : No Fall Risks No Fall Risks  Orthopedic patient   Follow up Falls prevention discussed Education provided  Falls prevention discussed     FALL RISK PREVENTION PERTAINING TO THE HOME:  Any stairs in or around the home? Yes  If so, are there any without handrails? No  Home free of loose throw rugs in walkways, pet beds, electrical cords, etc? Yes  Adequate lighting in your home to reduce risk of falls? Yes   ASSISTIVE DEVICES UTILIZED TO PREVENT FALLS:  Life alert? No  Use of a cane, walker or w/c? Yes  Grab bars in the bathroom? Yes  Shower chair or bench in shower? Yes  Elevated toilet seat or a handicapped toilet? Yes       06/26/2017   10:38 AM  MMSE - Mini Mental State Exam  Orientation to time 5  Orientation to Place 5  Registration 3  Attention/ Calculation 5  Recall 2  Language- name 2 objects 2  Language- repeat 1  Language- follow 3 step command 3  Language- read & follow direction 1  Write a  sentence 1  Copy design 1  Total score 29        12/16/2022    1:24 PM 02/10/2022    1:13 PM 02/01/2021   11:51 AM 01/31/2020    2:22 PM 01/21/2019    3:02 PM  6CIT Screen  What Year? 0 points 0 points 0 points 0 points 0 points  What month? 0 points 0 points 0 points 0 points 0 points  What time? 0 points 0 points 0 points 0 points 0 points  Count back from 20 0 points 0 points 0 points 2 points 0 points  Months in reverse 0 points 0 points 0 points 0 points 0 points  Repeat phrase 0 points 0 points 4 points 2 points 0 points  Total Score 0 points 0 points 4 points 4 points 0 points    Immunizations Immunization History  Administered Date(s) Administered   Hepb-cpg 07/19/2018, 08/20/2018   Influenza, High Dose Seasonal PF 08/03/2018   Influenza,inj,Quad PF,6+ Mos 05/28/2014, 06/16/2015, 06/10/2016, 08/05/2020   Influenza,inj,quad, With Preservative 05/15/2017, 08/03/2018   Influenza-Unspecified 04/24/2017   Pneumococcal Conjugate-13 07/21/2014   Pneumococcal Polysaccharide-23 09/21/2015   Tdap 07/07/2009   Zoster Recombinat (Shingrix) 04/29/2017, 07/12/2017    TDAP status: Due, Education has been provided regarding the importance of this vaccine. Advised may receive this vaccine at local pharmacy or Health Dept. Aware to provide a copy of the vaccination record if obtained from local pharmacy or Health Dept. Verbalized acceptance and understanding.  Flu Vaccine status: Declined, Education has been provided regarding the importance of this vaccine but patient still declined. Advised may receive this vaccine at local pharmacy or Health Dept. Aware to provide a copy of the vaccination record if obtained from local pharmacy or Health Dept. Verbalized acceptance and understanding.  Pneumococcal vaccine status: Up to  date  Covid-19 vaccine status: Completed vaccines  Qualifies for Shingles Vaccine? Yes   Zostavax completed Yes   Shingrix Completed?: Yes  Screening Tests Health  Maintenance  Topic Date Due   Diabetic kidney evaluation - Urine ACR  09/20/2016   DTaP/Tdap/Td (2 - Td or Tdap) 07/08/2019   COLONOSCOPY (Pts 45-66yrs Insurance coverage will need to be confirmed)  08/07/2021   OPHTHALMOLOGY EXAM  11/03/2022   MAMMOGRAM  12/16/2022   DEXA SCAN  11/25/2023 (Originally 06/27/2019)   INFLUENZA VACCINE  03/16/2023   HEMOGLOBIN A1C  05/27/2023   Diabetic kidney evaluation - eGFR measurement  09/29/2023   FOOT EXAM  11/25/2023   Medicare Annual Wellness (AWV)  12/16/2023   Pneumonia Vaccine 81+ Years old  Completed   Hepatitis C Screening  Completed   Zoster Vaccines- Shingrix  Completed   HPV VACCINES  Aged Out   COVID-19 Vaccine  Discontinued    Health Maintenance  Health Maintenance Due  Topic Date Due   Diabetic kidney evaluation - Urine ACR  09/20/2016   DTaP/Tdap/Td (2 - Td or Tdap) 07/08/2019   COLONOSCOPY (Pts 45-6yrs Insurance coverage will need to be confirmed)  08/07/2021   OPHTHALMOLOGY EXAM  11/03/2022   MAMMOGRAM  12/16/2022    Colorectal cancer screening: Referral to GI placed declined due to health . Pt aware the office will call re: appt.  Mammogram status: Ordered scheduled 12/26/2022. Pt provided with contact info and advised to call to schedule appt.   Bone Density status: Ordered 12/16/2022. Pt provided with contact info and advised to call to schedule appt.  Lung Cancer Screening: (Low Dose CT Chest recommended if Age 76-80 years, 30 pack-year currently smoking OR have quit w/in 15years.) does not qualify.   Lung Cancer Screening Referral: n/a  Additional Screening:  Hepatitis C Screening: does not qualify; Completed 07/11/2018  Vision Screening: Recommended annual ophthalmology exams for early detection of glaucoma and other disorders of the eye. Is the patient up to date with their annual eye exam?  No  Who is the provider or what is the name of the office in which the patient attends annual eye exams? Unknown  If  pt is not established with a provider, would they like to be referred to a provider to establish care? No .   Dental Screening: Recommended annual dental exams for proper oral hygiene  Community Resource Referral / Chronic Care Management: CRR required this visit?  No   CCM required this visit?  No      Plan:     I have personally reviewed and noted the following in the patient's chart:   Medical and social history Use of alcohol, tobacco or illicit drugs  Current medications and supplements including opioid prescriptions. Patient is not currently taking opioid prescriptions. Functional ability and status Nutritional status Physical activity Advanced directives List of other physicians Hospitalizations, surgeries, and ER visits in previous 12 months Vitals Screenings to include cognitive, depression, and falls Referrals and appointments  In addition, I have reviewed and discussed with patient certain preventive protocols, quality metrics, and best practice recommendations. A written personalized care plan for preventive services as well as general preventive health recommendations were provided to patient.     Lorrene Reid, LPN   08/20/1094   Nurse Notes: Due TDAP vaccine

## 2022-12-17 ENCOUNTER — Other Ambulatory Visit: Payer: Self-pay | Admitting: Family Medicine

## 2022-12-19 ENCOUNTER — Telehealth: Payer: Self-pay | Admitting: Family Medicine

## 2022-12-19 NOTE — Telephone Encounter (Signed)
Last office visit 11/25/22 Last refill 02/24/22 #30, no refills

## 2022-12-19 NOTE — Telephone Encounter (Signed)
Contacted Stacey Summers to schedule their annual wellness visit. Appointment made for 12/16/2022. Thank you,  Judeth Cornfield,  AMB Clinical Support [facility] AWV Program Direct Dial ??[phone]

## 2022-12-19 NOTE — Telephone Encounter (Signed)
Contacted Stacey Summers to schedule their annual wellness visit. Appointment made for 12/25/2023.  Thank you,  Judeth Cornfield,  AMB Clinical Support [facility] AWV Program Direct Dial ??[phone]

## 2022-12-20 ENCOUNTER — Other Ambulatory Visit: Payer: Self-pay | Admitting: *Deleted

## 2022-12-20 DIAGNOSIS — R052 Subacute cough: Secondary | ICD-10-CM

## 2022-12-26 ENCOUNTER — Ambulatory Visit
Admission: RE | Admit: 2022-12-26 | Discharge: 2022-12-26 | Disposition: A | Discharge status: Home / Self Care | Payer: PPO | Source: Ambulatory Visit | Attending: Family Medicine | Admitting: Family Medicine

## 2022-12-26 DIAGNOSIS — Z1231 Encounter for screening mammogram for malignant neoplasm of breast: Secondary | ICD-10-CM | POA: Diagnosis not present

## 2022-12-30 NOTE — Telephone Encounter (Signed)
Patient aware. Will use [facility]

## 2023-01-01 ENCOUNTER — Other Ambulatory Visit: Payer: Self-pay | Admitting: Gastroenterology

## 2023-01-02 ENCOUNTER — Inpatient Hospital Stay: Payer: PPO | Attending: Hematology

## 2023-01-06 ENCOUNTER — Telehealth: Payer: Self-pay | Admitting: Gastroenterology

## 2023-01-06 NOTE — Telephone Encounter (Signed)
Patient's daughter is calling requesting to speak with Zella Ball, having questions regarding patient's blood sugar running low not sure if it is normal or not. Please advise

## 2023-01-06 NOTE — Telephone Encounter (Signed)
I can not advise on patients blood sugar level  she needs to contact her PCP. Thanks

## 2023-02-01 ENCOUNTER — Other Ambulatory Visit: Payer: Self-pay | Admitting: Family Medicine

## 2023-02-02 ENCOUNTER — Inpatient Hospital Stay: Payer: PPO | Attending: Hematology

## 2023-02-02 VITALS — BP 143/57 | HR 89 | Temp 97.5°F | Resp 18

## 2023-02-02 DIAGNOSIS — E538 Deficiency of other specified B group vitamins: Secondary | ICD-10-CM | POA: Diagnosis not present

## 2023-02-02 DIAGNOSIS — Z79899 Other long term (current) drug therapy: Secondary | ICD-10-CM | POA: Insufficient documentation

## 2023-02-02 MED ORDER — CYANOCOBALAMIN 1000 MCG/ML IJ SOLN
1000.0000 ug | Freq: Once | INTRAMUSCULAR | Status: AC
  Administered 2023-02-02: 1000 ug via INTRAMUSCULAR
  Filled 2023-02-02: qty 1

## 2023-02-02 NOTE — Patient Instructions (Signed)
[facility]  Discharge Instructions: Thank you for choosing [facility] to provide your oncology and hematology care.  If you have a lab appointment with the Cancer Center - please note that after April 8th, 2024, all labs will be drawn in the cancer center.  You do not have to check in or register with the main entrance as you have in the past but will complete your check-in in the cancer center.  Wear comfortable clothing and clothing appropriate for easy access to any Portacath or PICC line.   We strive to give you quality time with your provider. You may need to reschedule your appointment if you arrive late (15 or more minutes).  Arriving late affects you and other patients whose appointments are after yours.  Also, if you miss three or more appointments without notifying the office, you may be dismissed from the clinic at the provider's discretion.      For prescription refill requests, have your pharmacy contact our office and allow 72 hours for refills to be completed.    Today you received the following chemotherapy and/or immunotherapy agents B12      To help prevent nausea and vomiting after your treatment, we encourage you to take your nausea medication as directed.  BELOW ARE SYMPTOMS THAT SHOULD BE REPORTED IMMEDIATELY: *FEVER GREATER THAN 100.4 F (38 C) OR HIGHER *CHILLS OR SWEATING *NAUSEA AND VOMITING THAT IS NOT CONTROLLED WITH YOUR NAUSEA MEDICATION *UNUSUAL SHORTNESS OF BREATH *UNUSUAL BRUISING OR BLEEDING *URINARY PROBLEMS (pain or burning when urinating, or frequent urination) *BOWEL PROBLEMS (unusual diarrhea, constipation, pain near the anus) TENDERNESS IN MOUTH AND THROAT WITH OR WITHOUT PRESENCE OF ULCERS (sore throat, sores in mouth, or a toothache) UNUSUAL RASH, SWELLING OR PAIN  UNUSUAL VAGINAL DISCHARGE OR ITCHING   Items with * indicate a potential emergency and should be followed up as soon as possible or go to the  Emergency Department if any problems should occur.  Please show the CHEMOTHERAPY ALERT CARD or IMMUNOTHERAPY ALERT CARD at check-in to the Emergency Department and triage nurse.  Should you have questions after your visit or need to cancel or reschedule your appointment, please contact [facility] [phone]  and follow the prompts.  Office hours are 8:00 a.m. to 4:30 p.m. Monday - Friday. Please note that voicemails left after 4:00 p.m. may not be returned until the following business day.  We are closed weekends and major holidays. You have access to a nurse at all times for urgent questions. Please call the main number to the clinic [phone] and follow the prompts.  For any non-urgent questions, you may also contact your provider using MyChart. We now offer e-Visits for anyone 18 and older to request care online for non-urgent symptoms. For details visit [email or web address].   Also download the MyChart app! Go to the app store, search "MyChart", open the app, select [facility], and log in with your MyChart username and password.   

## 2023-02-02 NOTE — Progress Notes (Signed)
Stacey Summers presents today for B12 injection per the provider's orders.  Stable during administration without incident; injection site WNL; see MAR for injection details.  Patient tolerated procedure well and without incident.  No questions or complaints noted at this time.

## 2023-02-16 ENCOUNTER — Other Ambulatory Visit: Payer: Self-pay | Admitting: Gastroenterology

## 2023-02-27 ENCOUNTER — Inpatient Hospital Stay: Payer: PPO

## 2023-03-01 ENCOUNTER — Inpatient Hospital Stay: Payer: PPO | Attending: Hematology

## 2023-03-01 DIAGNOSIS — R918 Other nonspecific abnormal finding of lung field: Secondary | ICD-10-CM | POA: Diagnosis not present

## 2023-03-01 DIAGNOSIS — Z87891 Personal history of nicotine dependence: Secondary | ICD-10-CM | POA: Insufficient documentation

## 2023-03-01 DIAGNOSIS — D696 Thrombocytopenia, unspecified: Secondary | ICD-10-CM | POA: Diagnosis not present

## 2023-03-01 DIAGNOSIS — E538 Deficiency of other specified B group vitamins: Secondary | ICD-10-CM | POA: Insufficient documentation

## 2023-03-01 DIAGNOSIS — E611 Iron deficiency: Secondary | ICD-10-CM | POA: Diagnosis not present

## 2023-03-01 LAB — CBC WITH DIFFERENTIAL/PLATELET
Abs Immature Granulocytes: 0.01 10*3/uL (ref 0.00–0.07)
Basophils Absolute: 0 10*3/uL (ref 0.0–0.1)
Basophils Relative: 1 %
Eosinophils Absolute: 0.2 10*3/uL (ref 0.0–0.5)
Eosinophils Relative: 3 %
HCT: 36.7 % (ref 36.0–46.0)
Hemoglobin: 12.2 g/dL (ref 12.0–15.0)
Immature Granulocytes: 0 %
Lymphocytes Relative: 30 %
Lymphs Abs: 1.6 10*3/uL (ref 0.7–4.0)
MCH: 34 pg (ref 26.0–34.0)
MCHC: 33.2 g/dL (ref 30.0–36.0)
MCV: 102.2 fL — ABNORMAL HIGH (ref 80.0–100.0)
Monocytes Absolute: 0.5 10*3/uL (ref 0.1–1.0)
Monocytes Relative: 10 %
Neutro Abs: 3.1 10*3/uL (ref 1.7–7.7)
Neutrophils Relative %: 56 %
Platelets: 80 10*3/uL — ABNORMAL LOW (ref 150–400)
RBC: 3.59 MIL/uL — ABNORMAL LOW (ref 3.87–5.11)
RDW: 13.6 % (ref 11.5–15.5)
WBC: 5.4 10*3/uL (ref 4.0–10.5)
nRBC: 0 % (ref 0.0–0.2)

## 2023-03-01 LAB — IRON AND TIBC
Iron: 147 ug/dL (ref 28–170)
Saturation Ratios: 66 % — ABNORMAL HIGH (ref 10.4–31.8)
TIBC: 222 ug/dL — ABNORMAL LOW (ref 250–450)
UIBC: 75 ug/dL

## 2023-03-01 LAB — COMPREHENSIVE METABOLIC PANEL
ALT: 22 U/L (ref 0–44)
AST: 42 U/L — ABNORMAL HIGH (ref 15–41)
Albumin: 2.7 g/dL — ABNORMAL LOW (ref 3.5–5.0)
Alkaline Phosphatase: 101 U/L (ref 38–126)
Anion gap: 3 — ABNORMAL LOW (ref 5–15)
BUN: 33 mg/dL — ABNORMAL HIGH (ref 8–23)
CO2: 30 mmol/L (ref 22–32)
Calcium: 8.8 mg/dL — ABNORMAL LOW (ref 8.9–10.3)
Chloride: 104 mmol/L (ref 98–111)
Creatinine, Ser: 1.52 mg/dL — ABNORMAL HIGH (ref 0.44–1.00)
GFR, Estimated: 36 mL/min — ABNORMAL LOW (ref >60)
Glucose, Bld: 187 mg/dL — ABNORMAL HIGH (ref 70–99)
Potassium: 4.8 mmol/L (ref 3.5–5.1)
Sodium: 137 mmol/L (ref 135–145)
Total Bilirubin: 2.7 mg/dL — ABNORMAL HIGH (ref 0.3–1.2)
Total Protein: 5.7 g/dL — ABNORMAL LOW (ref 6.5–8.1)

## 2023-03-01 LAB — FERRITIN: Ferritin: 151 ng/mL (ref 11–307)

## 2023-03-01 LAB — VITAMIN B12: Vitamin B-12: 1200 pg/mL — ABNORMAL HIGH (ref 180–914)

## 2023-03-03 NOTE — Progress Notes (Signed)
[facility] [address], [place] [address]   CLINIC:  Medical Oncology/Hematology  PCP:  [name], DO [address] [place] [address] [phone]   REASON FOR VISIT:  Follow-up for thrombocytopenia   PRIOR THERAPY: None   CURRENT THERAPY: Monthly B12 injections  INTERVAL HISTORY:   Stacey [facility]mers 77 y.o. female returns for routine follow-up of her thrombocytopenia, B12 deficiency, and lung nodules.  She was last seen by Rojelio Brenner PA-C on 09/01/2022.   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   She denies noticing any bleeding events such as hematemesis, hematochezia, epistaxis, melena, or hematuria.  *** *** She does have some mild easy bruising on her extremities, but denies any petechial rash. *** No B symptoms such as fever, chills, night sweats, unintentional weight loss. *** She continues to have chronic fatigue related to her multiple medical comorbidities.   *** She has been taking iron tablet daily for the past year which she is tolerating well.  *** (Every other day since last appointment?)  ***  She has 50***% energy and 100***% appetite. She endorses that she is maintaining a stable weight.  ASSESSMENT & PLAN:  1.  Thrombocytopenia: - Platelet count is ranging anywhere between 70-140.  Most recent platelets (03/01/2023) at 80, baseline. - CT of the AP on 06/27/2018 showed spleen is mildly enlarged.  She has cirrhosis of the liver. - MRI abdomen (06/29/2022): Spleen normal in size without significant abnormality - EGD on 08/20/2019 showed grade 1 varices, but patient denies any melena or bright red blood per rectum *** - No signs or symptoms of bleeding at this time. *** Admits to easy bruising but denies petechial rash.*** -  Differential diagnosis favors thrombocytopenia secondary to splenomegaly and cirrhosis. - PLAN: No treatment of thrombocytopenia needed at this time.  RTC for labs and follow-up visit in 6 months.     2.  Iron deficiency (without anemia) - Iron panel (07/28/2021): Ferritin 35, iron saturation 44% - Patient denies any signs of GI blood loss*** - Most recent EGD (08/20/2019) showed small esophageal varices and mild portal hypertensive gastropathy - She has been taking iron tablets since December 2022, which she is tolerating well *** - Most recent labs (03/01/2023): Hgb 12.2/MCV 102.2, ferritin 151, iron saturation 66% - PLAN: Continue ferrous sulfate 325 mg EVERY OTHER DAY.  We will recheck iron panel at follow-up visit in 6 months, and consider parenteral IV iron if no improvement.  3.  B12 deficiency - B12 deficiency evidenced by labs (03/26/2021) showing elevated methylmalonic acid 629, although B12 was normal at 336 - Patient has been receiving monthly B12 injections*** - Most recent labs (03/01/2023): Vitamin B12 1200, MMA pending *** - PLAN: Continue monthly B12 injections   4.  Lung nodules: - She is a former smoker and has not smoked cigarettes since 1971. - CT of the chest from 10/04/2019 which showed resolution of recent COVID-19 pneumonia.  Bilateral pulmonary nodules, less than 4 mm in size have been stable compared to previous CT chest 06/28/2019 -Repeat CT chest (07/28/2021): Bilateral small subpleural nodules are stable and benign, largest nodule in the peripheral left lower lobe measures 3.5 mm, unchanged. - PLAN: No further follow-up recommended per radiology report  PLAN SUMMARY:*** Not reviewed *** >> Monthly B12 injections >> Labs (CBC/D, CMP,  B12, MMA, ferritin, iron/TIBC) in 6 months >> RTC in 6 months (1 week after labs) for OFFICE visit    REVIEW OF SYSTEMS: ***  Review of Systems  Constitutional:  Positive for fatigue. Negative for appetite change, chills,  diaphoresis, fever and unexpected weight change.  HENT:   Positive for trouble swallowing. Negative for lump/mass and nosebleeds.   Eyes:  Negative for eye problems.  Respiratory:  Positive for shortness of breath. Negative for cough and hemoptysis.   Cardiovascular:  Positive for palpitations. Negative for chest pain and leg swelling.  Gastrointestinal:  Negative for abdominal pain, blood in stool, constipation, diarrhea, nausea and vomiting.  Genitourinary:  Negative for hematuria.   Skin: Negative.   Neurological:  Positive for dizziness, headaches and numbness. Negative for light-headedness.  Hematological:  Does not bruise/bleed easily.  Psychiatric/Behavioral:  Positive for sleep disturbance.      PHYSICAL EXAM:***  ECOG PERFORMANCE STATUS: 2 - Symptomatic, <50% confined to bed  There were no vitals filed for this visit. There were no vitals filed for this visit. Physical Exam Constitutional:      Appearance: Normal appearance. She is morbidly obese.  HENT:     Head: Normocephalic and atraumatic.     Mouth/Throat:     Mouth: Mucous membranes are moist.  Eyes:     Extraocular Movements: Extraocular movements intact.     Pupils: Pupils are equal, round, and reactive to light.  Cardiovascular:     Rate and Rhythm: Normal rate and regular rhythm.     Pulses: Normal pulses.     Heart sounds: Murmur heard.  Pulmonary:     Effort: Pulmonary effort is normal.     Breath sounds: Normal breath sounds.  Abdominal:     General: Bowel sounds are normal.     Palpations: Abdomen is soft.     Tenderness: There is no abdominal tenderness.  Musculoskeletal:        General: No swelling.     Right lower leg: Edema (lymphedema wraps in place) present.     Left lower leg: Edema (lymphedema wraps in place) present.  Lymphadenopathy:     Cervical: No cervical adenopathy.  Skin:    General: Skin is warm and dry.  Neurological:     General: No focal deficit present.     Mental Status:  She is alert and oriented to person, place, and time.  Psychiatric:        Mood and Affect: Mood normal.        Behavior: Behavior normal.     PAST MEDICAL/SURGICAL HISTORY:  Past Medical History:  Diagnosis Date   Allergy    seasonal   Anxiety    Arthritis    hands, back    B12 deficiency 03/31/2021   Cataract    surgery to remove   Chronic back pain    lower back    Cirrhosis of liver without ascites (HCC)    recent dx 07/11/18   Closed fracture of right tibial plateau 01/31/2018   Complex tear of medial meniscus of right knee 03/27/2018   COPD (chronic obstructive pulmonary disease) (HCC)    no inhaler   Diabetes mellitus without complication (HCC)    DJD (degenerative joint disease)    Fibromyalgia    Genetic testing 03/08/2017   Ms. Harjo underwent genetic counseling and testing for hereditary cancer syndromes on 02/21/2017. Her results were negative for mutations in all 83 genes analyzed by [facility]'s 83-gene Multi-Cancers Panel. 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 hearing aids   History of abnormal mammogram    History of arthroplasty of right knee 07/06/2018   Hyperlipidemia    Hypertension    Irregular heart beat    Neuromuscular disorder (HCC)    fibromyalgia   Sleep apnea    cpap some   Stress fracture of right tibia 03/27/2018   SVD (spontaneous vaginal delivery)    x 2   Tubular adenoma of colon 05/09/12   "innumerable number" of flat polyps   Past Surgical History:  Procedure Laterality Date   CARPAL TUNNEL RELEASE Left 10/20/14   [facility]   CATARACT EXTRACTION, BILATERAL     CESAREAN SECTION  1977   CHOLECYSTECTOMY  1990   COLONOSCOPY     IR RADIOLOGIST EVAL & MGMT  07/16/2019   KNEE ARTHROSCOPY WITH SUBCHONDROPLASTY Right  03/27/2018   Procedure: Right knee arthroscopic partial medial meniscus repair versus menisectomy with medial tibia subchondroplasty;  Surgeon: Yolonda Kida, MD;  Location: [facility];  Service: Orthopedics;  Laterality: Right;  120 mins   LARYNX SURGERY     POLYP REMOVAL   MULTIPLE TOOTH EXTRACTIONS     POLYPECTOMY     vocal cord   TONSILLECTOMY  1965   TUBAL LIGATION     TUMOR REMOVAL     benign fatty tumors on abdomen   UPPER GASTROINTESTINAL ENDOSCOPY      SOCIAL HISTORY:  Social History   Socioeconomic History   Marital status: Married    Spouse name: Molly Maduro "goes by [employer]"   Number of children: 3   Years of education: 11   Highest education level: GED or equivalent  Occupational History   Occupation: retired  Tobacco Use   Smoking status: Former    Current packs/day: 0.00    Average packs/day: 1 pack/day for 3.0 years (3.0 ttl pk-yrs)    Types: Cigarettes    Start date: 08/15/1966    Quit date: 08/15/1969    Years since quitting: 53.5   Smokeless tobacco: Never  Vaping Use   Vaping status: Never Used  Substance and Sexual Activity   Alcohol use: No    Alcohol/week: 0.0 standard drinks of alcohol   Drug use: No   Sexual activity: Not Currently  Other Topics Concern   Not on file  Social History Narrative   Daughter lives with them and helps a lot   Social Determinants of Health   Financial Resource Strain: Low Risk  (12/16/2022)   Overall Financial Resource Strain (CARDIA)    Difficulty of Paying Living Expenses: Not hard at all  Food Insecurity: No Food Insecurity (12/16/2022)   Hunger Vital Sign    Worried About Running Out of Food in the Last Year: Never true    Ran Out of Food in the Last Year: Never true  Transportation Needs: No Transportation Needs (12/16/2022)   PRAPARE - Administrator, Civil Service (Medical): No    Lack of Transportation (Non-Medical): No  Physical Activity: Inactive (12/16/2022)   Exercise Vital Sign    Days of Exercise  per Week: 0 days    Minutes of Exercise per Session: 0 min  Stress: No Stress Concern Present (12/16/2022)   [employer] of Occupational Health - Occupational Stress Questionnaire  Feeling of Stress : Not at all  Social Connections: Socially Integrated (12/16/2022)   Social Connection and Isolation Panel [NHANES]    Frequency of Communication with Friends and Family: More than three times a week    Frequency of Social Gatherings with Friends and Family: More than three times a week    Attends Religious Services: More than 4 times per year    Active Member of [employer] or Organizations: Yes    Attends Engineer, structural: More than 4 times per year    Marital Status: Married  Catering manager Violence: Not At Risk (12/16/2022)   Humiliation, Afraid, Rape, and Kick questionnaire    Fear of Current or Ex-Partner: No    Emotionally Abused: No    Physically Abused: No    Sexually Abused: No    FAMILY HISTORY:  Family History  Problem Relation Age of Onset   Heart failure Mother        d.37   Heart disease Mother    Prostate cancer Father        d.89 diagnosed in late-60s   Colon cancer Father        diagnosed at 72   Leukemia Maternal Aunt    Heart failure Maternal Uncle        d.50   Stomach cancer Paternal Aunt        questionable   Colon cancer Paternal Uncle        in his 39s   Heart failure Maternal Grandmother        d.45   Kidney cancer Cousin        d.78s maternal first-cousin   Colon polyps Brother        polyposis. Underwent partial colectomy at age 28.   Spina bifida Brother        died at birth   Colon polyps Son 32   Heart attack Son 56   Other Other 16       multiple lipomas   Esophageal cancer Neg Hx    Rectal cancer Neg Hx    Breast cancer Neg Hx     CURRENT MEDICATIONS:  Outpatient Encounter Medications as of 03/06/2023  Medication Sig   albuterol (VENTOLIN HFA) 108 (90 Base) MCG/ACT inhaler Inhale 2 puffs into the lungs every 6 (six) hours  as needed for wheezing or shortness of breath.   ALPRAZolam (XANAX) 0.25 MG tablet Take 1/2-1 tablet once daily as needed for severe anxiety/ panic attack. May cause hallucinations/ falls   augmented betamethasone dipropionate (DIPROLENE-AF) 0.05 % cream Apply topically.   benazepril (LOTENSIN) 20 MG tablet Take 1 tablet by mouth once daily   Biotin 5000 MCG SUBL Place under the tongue.   blood glucose meter kit and supplies KIT Dispense based on patient and insurance preference. Use up to four times daily as directed. (FOR ICD-9 250.00, 250.01).   Blood Glucose Monitoring Suppl (ONETOUCH VERIO FLEX SYSTEM) w/Device KIT Check Bs up to 4 times daily Dx E11.9,   Cholecalciferol (VITAMIN D3) 5000 units CAPS Take 5,000 Units by mouth daily.   cyclobenzaprine (FLEXERIL) 5 MG tablet TAKE 1 TABLET BY MOUTH THREE TIMES DAILY AS NEEDED FOR MUSCLE SPASM (NEEDS  APPOINTMENT)   diltiazem (CARDIZEM CD) 120 MG 24 hr capsule Take 1 capsule by mouth once daily   fluticasone (FLONASE) 50 MCG/ACT nasal spray Place 2 sprays into both nostrils daily.   furosemide (LASIX) 40 MG tablet Take 60 mg once daily (one and a half tablets). For  a weight of 250 and higher, take 80 mg in the morning and 40 mg in the late afternoon   glucose blood (ONETOUCH VERIO) test strip Check Bs up to 4 times daily Dx E11.9,   lactulose (CHRONULAC) 10 GM/15ML solution TAKE 15 ML BY MOUTH  THREE TIMES DAILY AS NEEDED   meclizine (ANTIVERT) 25 MG tablet TAKE 1 TABLET BY MOUTH THREE TIMES DAILY AS NEEDED FOR DIZZINESS   ondansetron (ZOFRAN-ODT) 4 MG disintegrating tablet DISSOLVE 1 TABLET IN MOUTH EVERY 6 HOURS AS NEEDED FOR NAUSEA AND VOMITING   ONETOUCH DELICA LANCETS 33G MISC USE ONE LANCET TO CHECK GLUCOSE ONCE DAILY AND AS NEEDED   rifAXIMin (XIFAXAN PO) Take 500 mg by mouth 2 (two) times daily.   spironolactone (ALDACTONE) 25 MG tablet Take 1 tablet (25 mg total) by mouth daily.   traMADol (ULTRAM) 50 MG tablet Take 1 tablet (50 mg total)  by mouth every 12 (twelve) hours as needed for severe pain.   XIFAXAN 550 MG TABS tablet TAKE ONE TABLET BY MOUTH TWICE A DAY   No facility-administered encounter medications on file as of 03/06/2023.    ALLERGIES:  Allergies  Allergen Reactions   Latex     "my hands feels numb"    LABORATORY DATA:  I have reviewed the labs as listed.  CBC    Component Value Date/Time   WBC 5.4 03/01/2023 1458   RBC 3.59 (L) 03/01/2023 1458   HGB 12.2 03/01/2023 1458   HGB 14.7 09/13/2021 1135   HCT 36.7 03/01/2023 1458   HCT 43.0 09/13/2021 1135   PLT 80 (L) 03/01/2023 1458   PLT 100 (LL) 09/13/2021 1135   MCV 102.2 (H) 03/01/2023 1458   MCV 98 (H) 09/13/2021 1135   MCH 34.0 03/01/2023 1458   MCHC 33.2 03/01/2023 1458   RDW 13.6 03/01/2023 1458   RDW 12.9 09/13/2021 1135   LYMPHSABS 1.6 03/01/2023 1458   LYMPHSABS 1.3 09/13/2021 1135   MONOABS 0.5 03/01/2023 1458   EOSABS 0.2 03/01/2023 1458   EOSABS 0.1 09/13/2021 1135   BASOSABS 0.0 03/01/2023 1458   BASOSABS 0.1 09/13/2021 1135      Latest Ref Rng & Units 03/01/2023    2:58 PM 12/08/2022   11:48 AM 09/28/2022   10:20 AM  CMP  Glucose 70 - 99 mg/dL 324   401   BUN 8 - 23 mg/dL 33   35   Creatinine 0.27 - 1.00 mg/dL 2.53   6.64   Sodium 403 - 145 mmol/L 137   142   Potassium 3.5 - 5.1 mmol/L 4.8   4.4   Chloride 98 - 111 mmol/L 104   104   CO2 22 - 32 mmol/L 30   25   Calcium 8.9 - 10.3 mg/dL 8.8   9.0   Total Protein 6.5 - 8.1 g/dL 5.7  5.6    Total Bilirubin 0.3 - 1.2 mg/dL 2.7  3.5    Alkaline Phos 38 - 126 U/L 101  83    AST 15 - 41 U/L 42  44    ALT 0 - 44 U/L 22  21      DIAGNOSTIC IMAGING:  I have independently reviewed the relevant imaging and discussed with the patient.   WRAP UP:  All questions were answered. The patient knows to call the clinic with any problems, questions or concerns.  Medical decision making: Moderate  Time spent on visit: I spent 20 minutes counseling the patient face to face.  The  total time spent in the appointment was 30 minutes and more than 50% was on counseling.  Carnella Guadalajara, PA-C  ***

## 2023-03-05 LAB — METHYLMALONIC ACID, SERUM: Methylmalonic Acid, Quantitative: 359 nmol/L (ref 0–378)

## 2023-03-06 ENCOUNTER — Ambulatory Visit: Payer: PPO | Attending: Cardiovascular Disease | Admitting: Cardiovascular Disease

## 2023-03-06 ENCOUNTER — Inpatient Hospital Stay: Payer: PPO

## 2023-03-06 ENCOUNTER — Encounter: Payer: Self-pay | Admitting: Cardiovascular Disease

## 2023-03-06 ENCOUNTER — Inpatient Hospital Stay (HOSPITAL_BASED_OUTPATIENT_CLINIC_OR_DEPARTMENT_OTHER): Payer: PPO | Admitting: Physician Assistant

## 2023-03-06 VITALS — BP 116/48 | HR 100 | Temp 98.6°F | Resp 20 | Wt 224.3 lb

## 2023-03-06 VITALS — BP 118/52 | HR 53 | Ht 61.0 in | Wt 224.2 lb

## 2023-03-06 DIAGNOSIS — E538 Deficiency of other specified B group vitamins: Secondary | ICD-10-CM

## 2023-03-06 DIAGNOSIS — E1159 Type 2 diabetes mellitus with other circulatory complications: Secondary | ICD-10-CM | POA: Diagnosis not present

## 2023-03-06 DIAGNOSIS — I5032 Chronic diastolic (congestive) heart failure: Secondary | ICD-10-CM

## 2023-03-06 DIAGNOSIS — I50812 Chronic right heart failure: Secondary | ICD-10-CM | POA: Diagnosis not present

## 2023-03-06 DIAGNOSIS — E611 Iron deficiency: Secondary | ICD-10-CM

## 2023-03-06 DIAGNOSIS — K7460 Unspecified cirrhosis of liver: Secondary | ICD-10-CM | POA: Diagnosis not present

## 2023-03-06 DIAGNOSIS — K7581 Nonalcoholic steatohepatitis (NASH): Secondary | ICD-10-CM | POA: Diagnosis not present

## 2023-03-06 DIAGNOSIS — I10 Essential (primary) hypertension: Secondary | ICD-10-CM | POA: Diagnosis not present

## 2023-03-06 DIAGNOSIS — G4733 Obstructive sleep apnea (adult) (pediatric): Secondary | ICD-10-CM | POA: Diagnosis not present

## 2023-03-06 DIAGNOSIS — I152 Hypertension secondary to endocrine disorders: Secondary | ICD-10-CM

## 2023-03-06 DIAGNOSIS — D696 Thrombocytopenia, unspecified: Secondary | ICD-10-CM | POA: Diagnosis not present

## 2023-03-06 DIAGNOSIS — Z862 Personal history of diseases of the blood and blood-forming organs and certain disorders involving the immune mechanism: Secondary | ICD-10-CM | POA: Diagnosis not present

## 2023-03-06 DIAGNOSIS — R002 Palpitations: Secondary | ICD-10-CM

## 2023-03-06 MED ORDER — BENAZEPRIL HCL 10 MG PO TABS: 10.0000 mg | ORAL_TABLET | Freq: Every day | ORAL | 3 refills | Status: DC

## 2023-03-06 MED ORDER — CYANOCOBALAMIN 1000 MCG/ML IJ SOLN
1000.0000 ug | Freq: Once | INTRAMUSCULAR | Status: AC
  Administered 2023-03-06: 1000 ug via INTRAMUSCULAR
  Filled 2023-03-06: qty 1

## 2023-03-06 NOTE — Patient Instructions (Signed)

## 2023-03-06 NOTE — Patient Instructions (Signed)
[facility] at [facility] Discharge Instructions  You were seen today by Rojelio Brenner PA-C for your low platelets.  As we discussed, your low platelets are due to your underlying liver disease.  They remain low, but stable at their baseline.  You do not need any treatment for low platelets at this time.  You should continue to receive B12 injections, but we will decrease this to every 6 weeks (instead of monthly).  You do not need any iron supplements at this time.  LABS: Return in 6 months for repeat labs  FOLLOW-UP APPOINTMENT: Office visit in 6 months, after labs   ** Thank you for trusting me with your healthcare!  I strive to provide all of my patients with quality care at each visit.  If you receive a survey for this visit, I would be so grateful to you for taking the time to provide feedback.  Thank you in advance!  ~ Devereaux Grayson                   Dr. Doreatha Massed   &   Rojelio Brenner, PA-C   - - - - - - - - - - - - - - - - - -     Thank you for choosing [facility] at [facility] to provide your oncology and hematology care.  To afford each patient quality time with our provider, please arrive at least 15 minutes before your scheduled appointment time.   If you have a lab appointment with the Cancer Center please come in thru the Main Entrance and check in at the main information desk.  You need to re-schedule your appointment should you arrive 10 or more minutes late.  We strive to give you quality time with our providers, and arriving late affects you and other patients whose appointments are after yours.  Also, if you no show three or more times for appointments you may be dismissed from the clinic at the providers discretion.     Again, thank you for choosing [facility].  Our hope is that these requests will decrease the amount of time that you wait before being seen by our physicians.        _____________________________________________________________  Should you have questions after your visit to [facility], please contact our office at [phone] and follow the prompts.  Our office hours are 8:00 a.m. and 4:30 p.m. Monday - Friday.  Please note that voicemails left after 4:00 p.m. may not be returned until the following business day.  We are closed weekends and major holidays.  You do have access to a nurse 24-7, just call the main number to the clinic [phone] and do not press any options, hold on the line and a nurse will answer the phone.    For prescription refill requests, have your pharmacy contact our office and allow 72 hours.    Due to Covid, you will need to wear a mask upon entering the hospital. If you do not have a mask, a mask will be given to you at the Main Entrance upon arrival. For doctor visits, patients may have 1 support person age 70 or older with them. For treatment visits, patients can not have anyone with them due to social distancing guidelines and our immunocompromised population.

## 2023-03-06 NOTE — Progress Notes (Signed)
Patient tolerated injection with no complaints voiced.  Site clean and dry with no bruising or swelling noted at site.  See MAR for details.  Band aid applied.  Patient stable during and after injection.  Vss with discharge and left in satisfactory condition with no s/s of distress noted.  

## 2023-03-06 NOTE — Progress Notes (Unsigned)
Cardiology office note   Date:  03/07/2023   ID:  Stacey Summers, DOB 02-Jul-1946, MRN [id number]   PCP:  [name], DO  Cardiologist:  Thurmon Fair, MD  Electrophysiologist:  None   Evaluation Performed:  Follow-Up Visit  Chief Complaint: edema  History of Present Illness:    Stacey Summers is a 77 y.o. female with Morbid obesity, obstructive sleep apnea, essential hypertension, hyperlipidemia and chronic leg edema.  She has cirrhosis due to NASH.  She has a history of palpitations that responded reasonably well to treatment with diltiazem.    She is doing better.  She is kept her weight close to our estimated "dry weight of 220 pounds (she weighs 224 pounds in the office today, but at home oscillates 216-218 pounds).  She has chronic lymphedema of the lower extremities, but it is greatly improved from earlier in the year.  She is taking furosemide 40 mg daily and occasionally takes an additional 20 mg if she is that she is excessively swollen.  She also take spironolactone 25 mg once daily.  She has no weeping from the exam her abdominal distention and breathing are also better.  She is no longer dyspneic with walking through the house or in our office today.  She continues to have occasional problems with muscle cramps.  He has not had severe encephalopathy recently, although she occasionally develops confusion in the evenings.  Continues to have difficulty with her sleep-wake cycle, staying up for long periods of the night and sleeping during the day.  She is taking lactulose and rifaximin.  Adding the rifaximin has made a big difference in her mental status.  Most recent labs showed a creatinine of 1.52 and potassium of 4.8.  These are slightly worse than her previous baseline, but acceptable.  Her nephrologist is Dr. Wolfgang Phoenix in [place].  She underwent an echocardiogram on January 18, 2022 that showed evidence of elevated left heart filling pressures (grade 2  diastolic dysfunction)   She is concerned about possible "blood clots" although I am not sure why this is specifically her concern.  Her mother died suddenly without clear explanation by postmortem study at age 61.  Her brother, who had multiple health problems diabetes sleep at age 51 not long ago.  She thinks maybe they had clots.   Past Medical History:  Diagnosis Date   Allergy    seasonal   Anxiety    Arthritis    hands, back    B12 deficiency 03/31/2021   Cataract    surgery to remove   Chronic back pain    lower back    Cirrhosis of liver without ascites (HCC)    recent dx 07/11/18   Closed fracture of right tibial plateau 01/31/2018   Complex tear of medial meniscus of right knee 03/27/2018   COPD (chronic obstructive pulmonary disease) (HCC)    no inhaler   Diabetes mellitus without complication (HCC)    DJD (degenerative joint disease)    Fibromyalgia    Genetic testing 03/08/2017   Ms. Arington underwent genetic counseling and testing for hereditary cancer syndromes on 02/21/2017. Her results were negative for mutations in all 83 genes analyzed by [facility]'s 83-gene Multi-Cancers Panel. 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   History of abnormal mammogram    History of arthroplasty of right knee 07/06/2018   Hyperlipidemia    Hypertension    Irregular heart beat    Neuromuscular disorder (HCC)    fibromyalgia   Sleep apnea    cpap some   Stress fracture of right tibia 03/27/2018   SVD (spontaneous vaginal delivery)    x 2   Tubular adenoma of colon 05/09/12   "innumerable number" of flat polyps   Past Surgical History:  Procedure Laterality Date   CARPAL TUNNEL RELEASE Left 10/20/14   [facility]   CATARACT EXTRACTION,  BILATERAL     CESAREAN SECTION  1977   CHOLECYSTECTOMY  1990   COLONOSCOPY     IR RADIOLOGIST EVAL & MGMT  07/16/2019   KNEE ARTHROSCOPY WITH SUBCHONDROPLASTY Right 03/27/2018   Procedure: Right knee arthroscopic partial medial meniscus repair versus menisectomy with medial tibia subchondroplasty;  Surgeon: Yolonda Kida, MD;  Location: [facility] OR;  Service: Orthopedics;  Laterality: Right;  120 mins   LARYNX SURGERY     POLYP REMOVAL   MULTIPLE TOOTH EXTRACTIONS     POLYPECTOMY     vocal cord   TONSILLECTOMY  1965   TUBAL LIGATION     TUMOR REMOVAL     benign fatty tumors on abdomen   UPPER GASTROINTESTINAL ENDOSCOPY       Current Meds  Medication Sig   ALPRAZolam (XANAX) 0.25 MG tablet Take 1/2-1 tablet once daily as needed for severe anxiety/ panic attack. May cause hallucinations/ falls   augmented betamethasone dipropionate (DIPROLENE-AF) 0.05 % cream Apply topically.   benazepril (LOTENSIN) 10 MG tablet Take 1 tablet (10 mg total) by mouth daily.   Biotin 5000 MCG SUBL Place under the tongue.   blood glucose meter kit and supplies KIT Dispense based on patient and insurance preference. Use up to four times daily as directed. (FOR ICD-9 250.00, 250.01).   Blood Glucose Monitoring Suppl (ONETOUCH VERIO FLEX SYSTEM) w/Device KIT Check Bs up to 4 times daily Dx E11.9,   Cholecalciferol (VITAMIN D3) 5000 units CAPS Take 5,000 Units by mouth daily.   diltiazem (CARDIZEM CD) 120 MG 24 hr capsule Take 1 capsule by mouth once daily   fluticasone (FLONASE) 50 MCG/ACT nasal spray Place 2 sprays into both nostrils daily.   furosemide (LASIX) 40 MG tablet Take 60 mg once daily (one and a half tablets). For a weight of 250 and higher, take 80 mg in the morning and 40 mg in the late afternoon   glucose blood (ONETOUCH VERIO) test strip Check Bs up to 4 times daily Dx E11.9,   lactulose (CHRONULAC) 10 GM/15ML solution TAKE 15 ML BY MOUTH  THREE TIMES DAILY AS NEEDED   ONETOUCH DELICA LANCETS  33G MISC USE ONE LANCET TO CHECK GLUCOSE ONCE DAILY AND AS NEEDED   spironolactone (ALDACTONE) 25 MG tablet Take 1 tablet (25 mg total) by mouth daily.   traMADol (ULTRAM) 50 MG tablet Take 1 tablet (50 mg total) by mouth every 12 (twelve) hours as needed for severe pain.   XIFAXAN 550 MG TABS tablet TAKE ONE TABLET BY MOUTH TWICE A DAY   [DISCONTINUED] benazepril (LOTENSIN) 20 MG tablet Take 1 tablet by mouth once daily     Allergies:   Latex   Social History   Tobacco Use   Smoking status: Former    Current packs/day: 0.00    Average packs/day: 1 pack/day for 3.0 years (3.0 ttl pk-yrs)  Types: Cigarettes    Start date: 08/15/1966    Quit date: 08/15/1969    Years since quitting: 53.5   Smokeless tobacco: Never  Vaping Use   Vaping status: Never Used  Substance Use Topics   Alcohol use: No    Alcohol/week: 0.0 standard drinks of alcohol   Drug use: No     Family Hx: The patient's family history includes Colon cancer in her father and paternal uncle; Colon polyps in her brother; Colon polyps (age of onset: 15) in her son; Heart attack (age of onset: 2) in her son; Heart disease in her mother; Heart failure in her maternal grandmother, maternal uncle, and mother; Kidney cancer in her cousin; Leukemia in her maternal aunt; Other (age of onset: 47) in an other family member; Prostate cancer in her father; Spina bifida in her brother; Stomach cancer in her paternal aunt. There is no history of Esophageal cancer, Rectal cancer, or Breast cancer.  ROS:   Please see the history of present illness.    All other systems are reviewed and are negative. Prior CV studies:   The following studies were reviewed today:  Echocardiogram 01/18/2022   1. Left ventricular ejection fraction, by estimation, is 60 to 65%. The  left ventricle has normal function. The left ventricle has no regional  wall motion abnormalities. Left ventricular diastolic parameters are  consistent with Grade II  diastolic  dysfunction (pseudonormalization). Elevated left ventricular end-diastolic  pressure.   2. Right ventricular systolic function is normal. The right ventricular  size is normal. There is normal pulmonary artery systolic pressure.   3. Left atrial size was moderately dilated.   4. The mitral valve is abnormal. Trivial mitral valve regurgitation. No  evidence of mitral stenosis.   5. Mean gradient across valve 13 mmHg calculated AVA 2.5 cm2 . The aortic  valve is tricuspid. There is moderate calcification of the aortic valve.  There is moderate thickening of the aortic valve. Aortic valve  regurgitation is not visualized. Aortic  valve sclerosis/calcification is present, without any evidence of aortic  stenosis.   6. The inferior vena cava is normal in size with greater than 50%  respiratory variability, suggesting right atrial pressure of 3 mmHg.    Labs/Other Tests and Data Reviewed:    EKG: ECG is ordered today.  Shows sinus brady with a single PVC poor R wave progression, no ischemic repolarization changes, QT 466 ms ; no change from previous  03/01/2023: ALT 22; BUN 33; Creatinine, Ser 1.52; Hemoglobin 12.2; Platelets 80; Potassium 4.8; Sodium 137   04/07/2022 Creatinine 1.06, sodium 146, potassium 3.7, albumin 3.0, AST 59, ALT 42, hemoglobin 13.4, platelets 66 K Creatinine clearance by 24 hour collection was 63 mL/minute  Recent Labs:   Recent Lipid Panel Lipid Panel     Component Value Date/Time   CHOL 103 06/28/2021 1106   CHOL 162 12/20/2012 1044   TRIG 55 06/28/2021 1106   TRIG 91 12/25/2014 1020   TRIG 83 12/20/2012 1044   HDL 48 06/28/2021 1106   HDL 42 12/25/2014 1020   HDL 42 12/20/2012 1044   CHOLHDL 2.1 06/28/2021 1106   LDLCALC 42 06/28/2021 1106   LDLCALC 59 01/29/2014 1031   LDLCALC 103 (H) 12/20/2012 1044   LABVLDL 13 06/28/2021 1106    Wt Readings from Last 3 Encounters:  03/06/23 224 lb 4.8 oz (101.7 kg)  03/06/23 224 lb 3.2 oz (101.7  kg)  12/16/22 225 lb (102.1 kg)  Objective:    Vital Signs:  BP (!) 118/52   Pulse (!) 53   Ht 5\' 1"  (1.549 m)   Wt 224 lb 3.2 oz (101.7 kg)   SpO2 98%   BMI 42.36 kg/m      General: Alert, oriented x3, no distress, morbidly obese Head: no evidence of trauma, PERRL, EOMI, no exophtalmos or lid lag, no myxedema, no xanthelasma; normal ears, nose and oropharynx Neck: normal jugular venous pulsations and no hepatojugular reflux; brisk carotid pulses without delay and no carotid bruits Chest: clear to auscultation, no signs of consolidation by percussion or palpation, normal fremitus, symmetrical and full respiratory excursions Cardiovascular: normal position and quality of the apical impulse, regular rhythm, normal first and second heart sounds, no murmurs, rubs or gallops Abdomen: no tenderness or distention, no masses by palpation, no abnormal pulsatility or arterial bruits, normal bowel sounds, no hepatosplenomegaly Extremities: Chronic 2-3+ edema of the feet ankles and calves bilaterally; moderate to severe lymphedema of the upper extremities is also present Neurological: grossly nonfocal Psych: Normal mood and affect     ASSESSMENT & PLAN:    CHF: She has multifactorial edema with evidence of chronic diastolic left heart failure, right heart failure from inconsistent treatment of sleep apnea, lymphedema, cirrhosis with hypoalbuminemia and chronic kidney disease.  I believe the cirrhosis is the biggest contributor.  I think we have reached a reasonable compromise between hypervolemia, which was causing an unhealthy amount of edema with skin compromise and excessive diuresis (only a slight increase in creatinine level and no evidence of worsening encephalopathy). Cirrhosis due to NASH albumin is lower than before, down to 2.7.  Platelet count is essentially unchanged in the 70-80 K range.  Suspect portal hypertension with splenic sequestration.  Encephalopathy is well-controlled on  a combination of lactulose and rifaximin.  Adding the rifaximin made a big difference. CKD: Borderline abnormal intrinsic kidney function, with elevated renal parameters due to heart and liver disease requiring diuretics.  24-hour urine collection showed creatinine clearance of 63 mL/min.  The creatinine did increase to 1.5 after increasing the dose of spironolactone, but I believe this is a compromise that we have to accept.  Need to check her renal function parameters several times a year. OSA: Strongly recommend the 100% compliance with CPAP whenever she sleeps, whether it is daytime or nighttime. Palpitations: In the past these appear to be related to atrial ectopic beats and are currently well suppressed on treatment with diltiazem.  This is the lowest once daily dosing of diltiazem that we can offer. HTN: Very well-controlled. HLP: All lipid parameters were acceptable range on last check. Severe obesity: Likely the major underlying cause for her dyslipidemia, hypertension, sleep apnea and NASH.  Real weight would be highly beneficial. History of anemia: Resolved.   Patient Instructions  Medication Instructions:  Your physician has recommended you make the following change in your medication:  1) DECREASE benazepril to 10 mg daily   Keep a log of your blood pressure for the next week and call us with your readings  Follow-Up: At [facility], you and your health needs are our priority.  As part of our continuing mission to provide you with exceptional heart care, we have created designated Provider Care Teams.  These Care Teams include your primary Cardiologist (physician) and Advanced Practice Providers (APPs -  Physician Assistants and Nurse Practitioners) who all work together to provide you with the care you need, when you need it.  Your next appointment:  1 year(s)  Provider:   Thurmon Fair, MD        Signed, Thurmon Fair, MD  03/07/2023 9:11 AM    [facility]

## 2023-03-06 NOTE — Patient Instructions (Signed)
Medication Instructions:  Your physician has recommended you make the following change in your medication:  1) DECREASE benazepril to 10 mg daily   Keep a log of your blood pressure for the next week and call us with your readings  Follow-Up: At [facility], you and your health needs are our priority.  As part of our continuing mission to provide you with exceptional heart care, we have created designated Provider Care Teams.  These Care Teams include your primary Cardiologist (physician) and Advanced Practice Providers (APPs -  Physician Assistants and Nurse Practitioners) who all work together to provide you with the care you need, when you need it.  Your next appointment:   1 year(s)  Provider:   Thurmon Fair, MD

## 2023-03-07 DIAGNOSIS — I5032 Chronic diastolic (congestive) heart failure: Secondary | ICD-10-CM | POA: Insufficient documentation

## 2023-03-07 DIAGNOSIS — I50812 Chronic right heart failure: Secondary | ICD-10-CM | POA: Insufficient documentation

## 2023-03-12 ENCOUNTER — Other Ambulatory Visit: Payer: Self-pay | Admitting: Gastroenterology

## 2023-03-27 ENCOUNTER — Ambulatory Visit (INDEPENDENT_AMBULATORY_CARE_PROVIDER_SITE_OTHER): Payer: PPO

## 2023-03-27 ENCOUNTER — Ambulatory Visit (INDEPENDENT_AMBULATORY_CARE_PROVIDER_SITE_OTHER): Payer: PPO | Admitting: Family Medicine

## 2023-03-27 ENCOUNTER — Encounter: Payer: Self-pay | Admitting: Family Medicine

## 2023-03-27 VITALS — BP 134/83 | HR 77 | Temp 98.4°F | Resp 20 | Ht 61.0 in | Wt 222.4 lb

## 2023-03-27 DIAGNOSIS — E785 Hyperlipidemia, unspecified: Secondary | ICD-10-CM

## 2023-03-27 DIAGNOSIS — K7581 Nonalcoholic steatohepatitis (NASH): Secondary | ICD-10-CM | POA: Diagnosis not present

## 2023-03-27 DIAGNOSIS — E1159 Type 2 diabetes mellitus with other circulatory complications: Secondary | ICD-10-CM

## 2023-03-27 DIAGNOSIS — K7460 Unspecified cirrhosis of liver: Secondary | ICD-10-CM | POA: Diagnosis not present

## 2023-03-27 DIAGNOSIS — E1169 Type 2 diabetes mellitus with other specified complication: Secondary | ICD-10-CM

## 2023-03-27 DIAGNOSIS — I152 Hypertension secondary to endocrine disorders: Secondary | ICD-10-CM

## 2023-03-27 DIAGNOSIS — Z780 Asymptomatic menopausal state: Secondary | ICD-10-CM

## 2023-03-27 DIAGNOSIS — M8589 Other specified disorders of bone density and structure, multiple sites: Secondary | ICD-10-CM | POA: Diagnosis not present

## 2023-03-27 DIAGNOSIS — E1122 Type 2 diabetes mellitus with diabetic chronic kidney disease: Secondary | ICD-10-CM

## 2023-03-27 DIAGNOSIS — Z5309 Procedure and treatment not carried out because of other contraindication: Secondary | ICD-10-CM

## 2023-03-27 DIAGNOSIS — N1832 Chronic kidney disease, stage 3b: Secondary | ICD-10-CM

## 2023-03-27 LAB — BAYER DCA HB A1C WAIVED: HB A1C (BAYER DCA - WAIVED): 5 % (ref 4.8–5.6)

## 2023-03-27 NOTE — Progress Notes (Signed)
Subjective: CC:DM PCP: [name], DO Stacey Summers is a 77 y.o. female presenting to clinic today for:  1. Type 2 Diabetes with hypertension, hyperlipidemia:  Diabetes has been diet controlled.  She does occasionally check blood sugars and she is not seeing anything that is especially high.  She notes that since her last visit her heart doctor reduce her Lotensin to 10 mg from 20 mg.  She seems to be doing okay on this.  She continues to take Lasix and spironolactone as prescribed and denies any fluid overload.  Diabetes Health Maintenance Due  Topic Date Due   OPHTHALMOLOGY EXAM  11/03/2022   HEMOGLOBIN A1C  05/27/2023   FOOT EXAM  11/25/2023    Last A1c:  Lab Results  Component Value Date   HGBA1C 5.4 11/25/2022    2.  Chronic pain Patient use is tramadol sparingly.  She denies any excessive daytime sedation but does have general ease of being sleepy due to cirrhosis.  She reports no constipation.  He is compliant with lactulose and Xifaxan for her liver.   ROS: Per HPI  Allergies  Allergen Reactions   Latex     "my hands feels numb"   Past Medical History:  Diagnosis Date   Allergy    seasonal   Anxiety    Arthritis    hands, back    B12 deficiency 03/31/2021   Cataract    surgery to remove   Chronic back pain    lower back    Cirrhosis of liver without ascites (HCC)    recent dx 07/11/18   Closed fracture of right tibial plateau 01/31/2018   Complex tear of medial meniscus of right knee 03/27/2018   COPD (chronic obstructive pulmonary disease) (HCC)    no inhaler   Diabetes mellitus without complication (HCC)    DJD (degenerative joint disease)    Fibromyalgia    Genetic testing 03/08/2017   Ms. Purtee underwent genetic counseling and testing for hereditary cancer syndromes on 02/21/2017. Her results were negative for mutations in all 83 genes analyzed by [facility]'s 83-gene Multi-Cancers Panel. Genes analyzed include: ALK, APC, ATM, AXIN2,  BAP1, BARD1, BLM, BMPR1A, BRCA1, BRCA2, BRIP1, CASR, CDC73, CDH1, CDK4, CDKN1B, CDKN1C, CDKN2A, CEBPA, CHEK2, CTNNA1, DICER1, DIS3L2, EGFR, EPCAM   GERD (gastroesophageal reflux disease)    Hearing loss    bilateral - no hearing aids   History of abnormal mammogram    History of arthroplasty of right knee 07/06/2018   Hyperlipidemia    Hypertension    Irregular heart beat    Neuromuscular disorder (HCC)    fibromyalgia   Sleep apnea    cpap some   Stress fracture of right tibia 03/27/2018   SVD (spontaneous vaginal delivery)    x 2   Tubular adenoma of colon 05/09/12   "innumerable number" of flat polyps    Current Outpatient Medications:    albuterol (VENTOLIN HFA) 108 (90 Base) MCG/ACT inhaler, Inhale 2 puffs into the lungs every 6 (six) hours as needed for wheezing or shortness of breath., Disp: 18 g, Rfl: 0   ALPRAZolam (XANAX) 0.25 MG tablet, Take 1/2-1 tablet once daily as needed for severe anxiety/ panic attack. May cause hallucinations/ falls, Disp: 30 tablet, Rfl: 0   augmented betamethasone dipropionate (DIPROLENE-AF) 0.05 % cream, Apply topically., Disp: , Rfl:    benazepril (LOTENSIN) 10 MG tablet, Take 1 tablet (10 mg total) by mouth daily., Disp: 90 tablet, Rfl: 3   Biotin 5000 MCG  SUBL, Place under the tongue., Disp: , Rfl:    blood glucose meter kit and supplies KIT, Dispense based on patient and insurance preference. Use up to four times daily as directed. (FOR ICD-9 250.00, 250.01)., Disp: 1 each, Rfl: 10   Blood Glucose Monitoring Suppl (ONETOUCH VERIO FLEX SYSTEM) w/Device KIT, Check Bs up to 4 times daily Dx E11.9,, Disp: 1 kit, Rfl: 0   Cholecalciferol (VITAMIN D3) 5000 units CAPS, Take 5,000 Units by mouth daily., Disp: , Rfl:    cyclobenzaprine (FLEXERIL) 5 MG tablet, TAKE 1 TABLET BY MOUTH THREE TIMES DAILY AS NEEDED FOR MUSCLE SPASM (NEEDS  APPOINTMENT), Disp: 30 tablet, Rfl: 0   diltiazem (CARDIZEM CD) 120 MG 24 hr capsule, Take 1 capsule by mouth once daily,  Disp: 90 capsule, Rfl: 3   fluticasone (FLONASE) 50 MCG/ACT nasal spray, Place 2 sprays into both nostrils daily., Disp: 16 g, Rfl: 6   furosemide (LASIX) 40 MG tablet, Take 60 mg once daily (one and a half tablets). For a weight of 250 and higher, take 80 mg in the morning and 40 mg in the late afternoon, Disp: 135 tablet, Rfl: 1   glucose blood (ONETOUCH VERIO) test strip, Check Bs up to 4 times daily Dx E11.9,, Disp: 400 each, Rfl: 3   lactulose (CHRONULAC) 10 GM/15ML solution, TAKE 15 ML BY MOUTH  THREE TIMES DAILY AS NEEDED, Disp: 946 mL, Rfl: 0   meclizine (ANTIVERT) 25 MG tablet, TAKE 1 TABLET BY MOUTH THREE TIMES DAILY AS NEEDED FOR DIZZINESS, Disp: 90 tablet, Rfl: 1   ondansetron (ZOFRAN-ODT) 4 MG disintegrating tablet, DISSOLVE 1 TABLET IN MOUTH EVERY 6 HOURS AS NEEDED FOR NAUSEA AND VOMITING, Disp: 20 tablet, Rfl: 0   ONETOUCH DELICA LANCETS 33G MISC, USE ONE LANCET TO CHECK GLUCOSE ONCE DAILY AND AS NEEDED, Disp: 100 each, Rfl: 0   rifAXIMin (XIFAXAN PO), Take 500 mg by mouth 2 (two) times daily., Disp: , Rfl:    spironolactone (ALDACTONE) 25 MG tablet, Take 1 tablet (25 mg total) by mouth daily., Disp: 90 tablet, Rfl: 3   traMADol (ULTRAM) 50 MG tablet, Take 1 tablet (50 mg total) by mouth every 12 (twelve) hours as needed for severe pain., Disp: 30 tablet, Rfl: 2   XIFAXAN 550 MG TABS tablet, TAKE ONE TABLET BY MOUTH TWICE A DAY, Disp: 180 tablet, Rfl: 2 Social History   Socioeconomic History   Marital status: Married    Spouse name: Molly Maduro "goes by [employer]"   Number of children: 3   Years of education: 11   Highest education level: GED or equivalent  Occupational History   Occupation: retired  Tobacco Use   Smoking status: Former    Current packs/day: 0.00    Average packs/day: 1 pack/day for 3.0 years (3.0 ttl pk-yrs)    Types: Cigarettes    Start date: 08/15/1966    Quit date: 08/15/1969    Years since quitting: 53.6   Smokeless tobacco: Never  Vaping Use   Vaping status:  Never Used  Substance and Sexual Activity   Alcohol use: No    Alcohol/week: 0.0 standard drinks of alcohol   Drug use: No   Sexual activity: Not Currently  Other Topics Concern   Not on file  Social History Narrative   Daughter lives with them and helps a lot   Social Determinants of Health   Financial Resource Strain: Low Risk  (12/16/2022)   Overall Financial Resource Strain (CARDIA)    Difficulty of Paying  Living Expenses: Not hard at all  Food Insecurity: No Food Insecurity (12/16/2022)   Hunger Vital Sign    Worried About Running Out of Food in the Last Year: Never true    Ran Out of Food in the Last Year: Never true  Transportation Needs: No Transportation Needs (12/16/2022)   PRAPARE - Administrator, Civil Service (Medical): No    Lack of Transportation (Non-Medical): No  Physical Activity: Inactive (12/16/2022)   Exercise Vital Sign    Days of Exercise per Week: 0 days    Minutes of Exercise per Session: 0 min  Stress: No Stress Concern Present (12/16/2022)   [employer] of Occupational Health - Occupational Stress Questionnaire    Feeling of Stress : Not at all  Social Connections: Socially Integrated (12/16/2022)   Social Connection and Isolation Panel [NHANES]    Frequency of Communication with Friends and Family: More than three times a week    Frequency of Social Gatherings with Friends and Family: More than three times a week    Attends Religious Services: More than 4 times per year    Active Member of [employer] or Organizations: Yes    Attends Engineer, structural: More than 4 times per year    Marital Status: Married  Catering manager Violence: Not At Risk (12/16/2022)   Humiliation, Afraid, Rape, and Kick questionnaire    Fear of Current or Ex-Partner: No    Emotionally Abused: No    Physically Abused: No    Sexually Abused: No   Family History  Problem Relation Age of Onset   Heart failure Mother        d.37   Heart disease Mother     Prostate cancer Father        d.89 diagnosed in late-60s   Colon cancer Father        diagnosed at 63   Leukemia Maternal Aunt    Heart failure Maternal Uncle        d.50   Stomach cancer Paternal Aunt        questionable   Colon cancer Paternal Uncle        in his 29s   Heart failure Maternal Grandmother        d.45   Kidney cancer Cousin        d.78s maternal first-cousin   Colon polyps Brother        polyposis. Underwent partial colectomy at age 14.   Spina bifida Brother        died at birth   Colon polyps Son 72   Heart attack Son 47   Other Other 16       multiple lipomas   Esophageal cancer Neg Hx    Rectal cancer Neg Hx    Breast cancer Neg Hx     Objective: Office vital signs reviewed. BP 134/83   Pulse 77   Temp 98.4 F (36.9 C) (Oral)   Resp 20   Ht 5\' 1"  (1.549 m)   Wt 222 lb 6 oz (100.9 kg)   SpO2 97%   BMI 42.02 kg/m   Physical Examination:  General: Awake, alert, well nourished, No acute distress HEENT: Sclera white.  Does not appear jaundiced. Cardio: regular rate and rhythm, S1S2 heard, 2/6 systolic murmurs appreciated Pulm: clear to auscultation bilaterally, no wheezes, rhonchi or rales; normal work of breathing on room air Extremities: warm, well perfused, No edema, cyanosis or clubbing Neuro: Oriented.  Conversive.  Assessment/ Plan: 77 y.o. female   Type 2 diabetes mellitus with stage 3b chronic kidney disease, without long-term current use of insulin (HCC) - Plan: Bayer DCA Hb A1c Waived, Microalbumin / creatinine urine ratio  Hyperlipidemia associated with type 2 diabetes mellitus (HCC)  Hypertension associated with diabetes (HCC)  Contraindication to statin medication  Liver cirrhosis secondary to NASH (HCC)   Her sugar remains diet controlled.  Blood pressure well-controlled.  Sounds like her liver has been stable with current medications.  She has refills on tramadol through October.  Looks like she is only used 1 out of  3 of these.  She may follow-up in 4 months, sooner if concerns arise   Hulen Skains, DO [facility] [phone]

## 2023-03-28 NOTE — Progress Notes (Signed)
DEXA scan showed osteopenia.  Recommend Vit D 800 IU daily, 1200mg  of Calcium daily. Weight bearing exercise. Recheck in 2 years.

## 2023-04-13 ENCOUNTER — Telehealth: Payer: Self-pay | Admitting: Cardiovascular Disease

## 2023-04-13 NOTE — Telephone Encounter (Signed)
Dr. Lucio Edward office is requesting we send the patient's insurance information to them. The fax number is [phone].

## 2023-04-13 NOTE — Telephone Encounter (Signed)
Will forward to billing

## 2023-04-17 ENCOUNTER — Other Ambulatory Visit: Payer: Self-pay | Admitting: Gastroenterology

## 2023-04-18 ENCOUNTER — Other Ambulatory Visit: Payer: PPO

## 2023-04-18 ENCOUNTER — Inpatient Hospital Stay: Payer: PPO | Attending: Hematology

## 2023-04-18 ENCOUNTER — Other Ambulatory Visit: Payer: Self-pay | Admitting: *Deleted

## 2023-04-18 VITALS — BP 138/94 | HR 60 | Temp 97.0°F | Resp 17

## 2023-04-18 DIAGNOSIS — E538 Deficiency of other specified B group vitamins: Secondary | ICD-10-CM

## 2023-04-18 DIAGNOSIS — E611 Iron deficiency: Secondary | ICD-10-CM

## 2023-04-18 DIAGNOSIS — N1831 Chronic kidney disease, stage 3a: Secondary | ICD-10-CM | POA: Diagnosis not present

## 2023-04-18 DIAGNOSIS — E1129 Type 2 diabetes mellitus with other diabetic kidney complication: Secondary | ICD-10-CM | POA: Diagnosis not present

## 2023-04-18 DIAGNOSIS — D696 Thrombocytopenia, unspecified: Secondary | ICD-10-CM

## 2023-04-18 DIAGNOSIS — D631 Anemia in chronic kidney disease: Secondary | ICD-10-CM | POA: Diagnosis not present

## 2023-04-18 DIAGNOSIS — D6959 Other secondary thrombocytopenia: Secondary | ICD-10-CM | POA: Diagnosis not present

## 2023-04-18 MED ORDER — CYANOCOBALAMIN 1000 MCG/ML IJ SOLN
1000.0000 ug | Freq: Once | INTRAMUSCULAR | Status: AC
  Administered 2023-04-18: 1000 ug via INTRAMUSCULAR
  Filled 2023-04-18: qty 1

## 2023-04-18 NOTE — Progress Notes (Signed)
Stacey Summers presents today for injection per the provider's orders.  B12 administration without incident; injection site WNL; see MAR for injection details.  Patient tolerated procedure well and without incident.  No questions or complaints noted at this time. Treatment given today per MD orders. Tolerated infusion without adverse affects. Alert and oriented x 3. F/U with [facility] as scheduled.     No complaints at this time. Discharged from clinic by wheel chair in stable condition. Alert and oriented x 3. F/U with [facility] as scheduled.

## 2023-04-18 NOTE — Patient Instructions (Signed)
[facility]  Discharge Instructions: Thank you for choosing [facility] to provide your oncology and hematology care.  If you have a lab appointment with the Cancer Center - please note that after April 8th, 2024, all labs will be drawn in the cancer center.  You do not have to check in or register with the main entrance as you have in the past but will complete your check-in in the cancer center.  Wear comfortable clothing and clothing appropriate for easy access to any Portacath or PICC line.   We strive to give you quality time with your provider. You may need to reschedule your appointment if you arrive late (15 or more minutes).  Arriving late affects you and other patients whose appointments are after yours.  Also, if you miss three or more appointments without notifying the office, you may be dismissed from the clinic at the provider's discretion.      For prescription refill requests, have your pharmacy contact our office and allow 72 hours for refills to be completed.    Today you received the following chemotherapy and/or immunotherapy agents vitamin B12 injection. Vitamin B12 Injection What is this medication? Vitamin B12 (VAHY tuh min B12) prevents and treats low vitamin B12 levels in your body. It is used in people who do not get enough vitamin B12 from their diet or when their digestive tract does not absorb enough. Vitamin B12 plays an important role in maintaining the health of your nervous system and red blood cells. This medicine may be used for other purposes; ask your health care provider or pharmacist if you have questions. COMMON BRAND NAME(S): B-12 Compliance Kit, B-12 Injection Kit, Cyomin, Dodex, LA-12, Nutri-Twelve, Physicians EZ Use B-12, Primabalt, Vitamin Deficiency Injectable System - B12 What should I tell my care team before I take this medication? They need to know if you have any of these conditions: Kidney disease Leber's  disease Megaloblastic anemia An unusual or allergic reaction to cyanocobalamin, cobalt, other medications, foods, dyes, or preservatives Pregnant or trying to get pregnant Breast-feeding How should I use this medication? This medication is injected into a muscle or deeply under the skin. It is usually given in a clinic or care team's office. However, your care team may teach you how to inject yourself. Follow all instructions. Talk to your care team about the use of this medication in children. Special care may be needed. Overdosage: If you think you have taken too much of this medicine contact a poison control center or emergency room at once. NOTE: This medicine is only for you. Do not share this medicine with others. What if I miss a dose? If you are given your dose at a clinic or care team's office, call to reschedule your appointment. If you give your own injections, and you miss a dose, take it as soon as you can. If it is almost time for your next dose, take only that dose. Do not take double or extra doses. What may interact with this medication? Alcohol Colchicine This list may not describe all possible interactions. Give your health care provider a list of all the medicines, herbs, non-prescription drugs, or dietary supplements you use. Also tell them if you smoke, drink alcohol, or use illegal drugs. Some items may interact with your medicine. What should I watch for while using this medication? Visit your care team regularly. You may need blood work done while you are taking this medication. You may need  to follow a special diet. Talk to your care team. Limit your alcohol intake and avoid smoking to get the best benefit. What side effects may I notice from receiving this medication? Side effects that you should report to your care team as soon as possible: Allergic reactions--skin rash, itching, hives, swelling of the face, lips, tongue, or throat Swelling of the ankles, hands, or  feet Trouble breathing Side effects that usually do not require medical attention (report to your care team if they continue or are bothersome): Diarrhea This list may not describe all possible side effects. Call your doctor for medical advice about side effects. You may report side effects to FDA at 1-800-FDA-1088. Where should I keep my medication? Keep out of the reach of children. Store at room temperature between 15 and 30 degrees C (59 and 85 degrees F). Protect from light. Throw away any unused medication after the expiration date. NOTE: This sheet is a summary. It may not cover all possible information. If you have questions about this medicine, talk to your doctor, pharmacist, or health care provider.  2024 Elsevier/Gold Standard (2021-04-13 00:00:00)       To help prevent nausea and vomiting after your treatment, we encourage you to take your nausea medication as directed.  BELOW ARE SYMPTOMS THAT SHOULD BE REPORTED IMMEDIATELY: *FEVER GREATER THAN 100.4 F (38 C) OR HIGHER *CHILLS OR SWEATING *NAUSEA AND VOMITING THAT IS NOT CONTROLLED WITH YOUR NAUSEA MEDICATION *UNUSUAL SHORTNESS OF BREATH *UNUSUAL BRUISING OR BLEEDING *URINARY PROBLEMS (pain or burning when urinating, or frequent urination) *BOWEL PROBLEMS (unusual diarrhea, constipation, pain near the anus) TENDERNESS IN MOUTH AND THROAT WITH OR WITHOUT PRESENCE OF ULCERS (sore throat, sores in mouth, or a toothache) UNUSUAL RASH, SWELLING OR PAIN  UNUSUAL VAGINAL DISCHARGE OR ITCHING   Items with * indicate a potential emergency and should be followed up as soon as possible or go to the Emergency Department if any problems should occur.  Please show the CHEMOTHERAPY ALERT CARD or IMMUNOTHERAPY ALERT CARD at check-in to the Emergency Department and triage nurse.  Should you have questions after your visit or need to cancel or reschedule your appointment, please contact [facility] [phone]  and  follow the prompts.  Office hours are 8:00 a.m. to 4:30 p.m. Monday - Friday. Please note that voicemails left after 4:00 p.m. may not be returned until the following business day.  We are closed weekends and major holidays. You have access to a nurse at all times for urgent questions. Please call the main number to the clinic [phone] and follow the prompts.  For any non-urgent questions, you may also contact your provider using MyChart. We now offer e-Visits for anyone 76 and older to request care online for non-urgent symptoms. For details visit [email or web address].   Also download the MyChart app! Go to the app store, search "MyChart", open the app, select [facility] [place], and log in with your MyChart username and password.

## 2023-04-19 ENCOUNTER — Other Ambulatory Visit (INDEPENDENT_AMBULATORY_CARE_PROVIDER_SITE_OTHER): Payer: PPO

## 2023-04-19 ENCOUNTER — Encounter: Payer: Self-pay | Admitting: Gastroenterology

## 2023-04-19 ENCOUNTER — Ambulatory Visit: Payer: PPO | Admitting: Gastroenterology

## 2023-04-19 VITALS — BP 132/64 | HR 53 | Wt 229.0 lb

## 2023-04-19 DIAGNOSIS — R188 Other ascites: Secondary | ICD-10-CM | POA: Diagnosis not present

## 2023-04-19 DIAGNOSIS — K7581 Nonalcoholic steatohepatitis (NASH): Secondary | ICD-10-CM

## 2023-04-19 DIAGNOSIS — R600 Localized edema: Secondary | ICD-10-CM

## 2023-04-19 DIAGNOSIS — K7460 Unspecified cirrhosis of liver: Secondary | ICD-10-CM

## 2023-04-19 LAB — PROTIME-INR
INR: 1.3 ratio — ABNORMAL HIGH (ref 0.8–1.0)
Prothrombin Time: 13.9 s — ABNORMAL HIGH (ref 9.6–13.1)

## 2023-04-19 LAB — CBC WITH DIFFERENTIAL/PLATELET
Basophils Absolute: 0 10*3/uL (ref 0.0–0.1)
Basophils Relative: 0.8 % (ref 0.0–3.0)
Eosinophils Absolute: 0.1 10*3/uL (ref 0.0–0.7)
Eosinophils Relative: 2.4 % (ref 0.0–5.0)
HCT: 38.6 % (ref 36.0–46.0)
Hemoglobin: 13.1 g/dL (ref 12.0–15.0)
Lymphocytes Relative: 37.8 % (ref 12.0–46.0)
Lymphs Abs: 2 10*3/uL (ref 0.7–4.0)
MCHC: 33.9 g/dL (ref 30.0–36.0)
MCV: 100.4 fl — ABNORMAL HIGH (ref 78.0–100.0)
Monocytes Absolute: 0.6 10*3/uL (ref 0.1–1.0)
Monocytes Relative: 10.5 % (ref 3.0–12.0)
Neutro Abs: 2.5 10*3/uL (ref 1.4–7.7)
Neutrophils Relative %: 48.5 % (ref 43.0–77.0)
Platelets: 91 10*3/uL — ABNORMAL LOW (ref 150.0–400.0)
RBC: 3.85 Mil/uL — ABNORMAL LOW (ref 3.87–5.11)
RDW: 13.5 % (ref 11.5–15.5)
WBC: 5.3 10*3/uL (ref 4.0–10.5)

## 2023-04-19 LAB — COMPREHENSIVE METABOLIC PANEL
ALT: 22 U/L (ref 0–35)
AST: 42 U/L — ABNORMAL HIGH (ref 0–37)
Albumin: 3.1 g/dL — ABNORMAL LOW (ref 3.5–5.2)
Alkaline Phosphatase: 85 U/L (ref 39–117)
BUN: 39 mg/dL — ABNORMAL HIGH (ref 6–23)
CO2: 31 meq/L (ref 19–32)
Calcium: 9.5 mg/dL (ref 8.4–10.5)
Chloride: 105 meq/L (ref 96–112)
Creatinine, Ser: 1.52 mg/dL — ABNORMAL HIGH (ref 0.40–1.20)
GFR: 33.45 mL/min — ABNORMAL LOW (ref >60.00)
Glucose, Bld: 119 mg/dL — ABNORMAL HIGH (ref 70–99)
Potassium: 4.4 meq/L (ref 3.5–5.1)
Sodium: 142 meq/L (ref 135–145)
Total Bilirubin: 3.7 mg/dL — ABNORMAL HIGH (ref 0.2–1.2)
Total Protein: 5.9 g/dL — ABNORMAL LOW (ref 6.0–8.3)

## 2023-04-19 MED ORDER — SPIRONOLACTONE 50 MG PO TABS: 50.0000 mg | ORAL_TABLET | Freq: Every day | ORAL | 3 refills | Status: DC

## 2023-04-19 NOTE — Progress Notes (Signed)
Stacey Summers    [id number]    06-19-1946  Primary Care Physician:Gottschalk, Rozell Searing, DO  Referring Physician: [name], DO [address] [place],  [place] [address]   Chief complaint:  NASH cirrhosis Chief Complaint  Patient presents with   Cirrhosis    Patient complains of some SOB with exertion. Patient reports some intermittent left sided abd pain that can be sharp at times.   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  I last saw her on 12-08-22.  She is accompanied by her daughter.   Today, she complains of left sides abdominal pain that tends to occur every now and then. She states that she does have a Muscle spasm medication but she only takes it when needed. She also states that she tends to have a gulping sensation when having liquids but is overall doing well on Rifaximin 500 mg twice daily.   She also complains of swelling in the legs and states they are worsened when being on long car rides. Her nephrologist has decreased her Benazepril to 10 mg but her daughter states that the swelling has gotten worse since decreasing her medication. She is also taking Aldactone 25 mg once daily. Her weight typically ranges between 215-218 pounds but she weighted 229 in the office today.  She continues to work with her church group which keeps her somewhat active.   Patient denies diarrhea, constipation, nausea, blood in stool, black stool, vomiting, bloating, unintentional weight loss, reflux, dysphagia.   GI Hx:  She was evaluated [facility] and [facility] transplant hepatology, not a candidate for liver transplant please see full consult notes in Care Everywhere for details. She has worsening congestive heart failure, had increase in diuretics dose with improvement.  Initially she had worsening renal function, creatinine peaked at 2.0 has since improved to 1.2-1.3   CT Abdomen w wo  contrast 06-30-22 1. Coarse, nodular cirrhotic morphology of the liver. No suspicious liver lesion or contrast enhancement on this examination which is somewhat limited by breath motion artifact. 2. Very narrowed appearance of the portal vein, suggestive of chronic thrombosis and/or partial cavernous transformation. 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 3. Trace perihepatic ascites. 4. Status post cholecystectomy. Unchanged postoperative biliary ductal dilatation.  US Abdomen Limited RUQ 09-06-21 No appreciable interval changes. The liver has a chronic nodular surface contour consistent with cirrhosis and a small cyst in the right lobe periphery but no significant focal abnormality. There is probable mild steatosis. Chronic post cholecystectomy common bile duct prominence is stable. No ascites or portal vein flow reversal is seen. The hepatic portal main vein is not dilated.  EGD 08/20/19: - Grade I and small (< 5 mm) esophageal varices. - No gross lesions in esophagus. - Portal hypertensive gastropathy. - Normal examined duodenum.   EGD 08/07/2018: - The Z-line was regular and was found 36 cm from the incisors. - No gross lesions were noted in the entire esophagus. No esophageal varices. - The stomach was normal. No gastric varices. - The examined duodenum was normal.   Colonoscopy 08/07/2018 - Four 4 to 9 mm polyps in the transverse colon and in the ascending colon, removed with a cold snare. Resected and retrieved. - Diverticulosis in the sigmoid colon. - Non-bleeding internal hemorrhoids.   Relevant history: History of COVID-19 infection November 2020.  Worsening mental status with confusion and somnolence post Covid.  She has prominent splenorenal shunt, was referred to IR to discuss possible embolization.  But given she has significant improvement of symptoms with Xifaxan twice daily, family and patient preferred to hold off the embolization.    Current  Outpatient Medications:    albuterol (VENTOLIN HFA) 108 (90 Base) MCG/ACT inhaler, Inhale 2 puffs into the lungs every 6 (six) hours as needed for wheezing or shortness of breath., Disp: 18 g, Rfl: 0   ALPRAZolam (XANAX) 0.25 MG tablet, Take 1/2-1 tablet once daily as needed for severe anxiety/ panic attack. May cause hallucinations/ falls, Disp: 30 tablet, Rfl: 0   augmented betamethasone dipropionate (DIPROLENE-AF) 0.05 % cream, Apply topically., Disp: , Rfl:    benazepril (LOTENSIN) 10 MG tablet, Take 1 tablet (10 mg total) by mouth daily., Disp: 90 tablet, Rfl: 3   Biotin 5000 MCG SUBL, Place under the tongue., Disp: , Rfl:    blood glucose meter kit and supplies KIT, Dispense based on patient and insurance preference. Use up to four times daily as directed. (FOR ICD-9 250.00, 250.01)., Disp: 1 each, Rfl: 10   Blood Glucose Monitoring Suppl (ONETOUCH VERIO FLEX SYSTEM) w/Device KIT, Check Bs up to 4 times daily Dx E11.9,, Disp: 1 kit, Rfl: 0   Cholecalciferol (VITAMIN D3) 5000 units CAPS, Take 5,000 Units by mouth daily., Disp: , Rfl:    cyclobenzaprine (FLEXERIL) 5 MG tablet, TAKE 1 TABLET BY MOUTH THREE TIMES DAILY AS NEEDED FOR MUSCLE SPASM (NEEDS  APPOINTMENT), Disp: 30 tablet, Rfl: 0   diltiazem (CARDIZEM CD) 120 MG 24 hr capsule, Take 1 capsule by mouth once daily, Disp: 90 capsule, Rfl: 3   fluticasone (FLONASE) 50 MCG/ACT nasal spray, Place 2 sprays into both nostrils daily., Disp: 16 g, Rfl: 6   furosemide (LASIX) 40 MG tablet, Take 60 mg once daily (one and a half tablets). For a weight of 250 and higher, take 80 mg in the morning and 40 mg in the late afternoon, Disp: 135 tablet, Rfl: 1   glucose blood (ONETOUCH VERIO) test strip, Check Bs up to 4 times daily Dx E11.9,, Disp: 400 each, Rfl: 3   lactulose (CHRONULAC) 10 GM/15ML solution, TAKE 15 ML BY MOUTH  THREE TIMES DAILY AS NEEDED, Disp: 946 mL, Rfl: 0   meclizine (ANTIVERT) 25 MG tablet, TAKE 1 TABLET BY MOUTH THREE TIMES DAILY AS  NEEDED FOR DIZZINESS, Disp: 90 tablet, Rfl: 1   ondansetron (ZOFRAN-ODT) 4 MG disintegrating tablet, DISSOLVE 1 TABLET IN MOUTH EVERY 6 HOURS AS NEEDED FOR NAUSEA AND VOMITING, Disp: 20 tablet, Rfl: 0   ONETOUCH DELICA LANCETS 33G MISC, USE ONE LANCET TO CHECK GLUCOSE ONCE DAILY AND AS NEEDED, Disp: 100 each, Rfl: 0   rifAXIMin (XIFAXAN PO), Take 500 mg by mouth 2 (two) times daily., Disp: , Rfl:    spironolactone (ALDACTONE) 25 MG tablet, Take 1 tablet (25 mg total) by mouth daily., Disp: 90 tablet, Rfl: 3   traMADol (ULTRAM) 50 MG tablet, Take 1 tablet (50 mg total) by mouth every 12 (twelve) hours as needed for severe pain., Disp: 30 tablet, Rfl: 2   XIFAXAN 550 MG TABS tablet, TAKE ONE TABLET BY MOUTH TWICE A DAY, Disp: 180 tablet, Rfl: 2    Allergies as of 04/19/2023 - Review Complete 04/19/2023  Allergen Reaction Noted   Latex  03/27/2018    Past Medical History:  Diagnosis Date   Allergy    seasonal   Anxiety    Arthritis    hands, back  B12 deficiency 03/31/2021   Cataract    surgery to remove   Chronic back pain    lower back    Cirrhosis of liver without ascites (HCC)    recent dx 07/11/18   Closed fracture of right tibial plateau 01/31/2018   Complex tear of medial meniscus of right knee 03/27/2018   COPD (chronic obstructive pulmonary disease) (HCC)    no inhaler   Diabetes mellitus without complication (HCC)    DJD (degenerative joint disease)    Fibromyalgia    Genetic testing 03/08/2017   Ms. Grow underwent genetic counseling and testing for hereditary cancer syndromes on 02/21/2017. Her results were negative for mutations in all 83 genes analyzed by [facility]'s 83-gene Multi-Cancers Panel. 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   History of abnormal mammogram    History of arthroplasty of right knee 07/06/2018   Hyperlipidemia    Hypertension    Irregular heart beat    Neuromuscular disorder (HCC)    fibromyalgia   Sleep apnea    cpap some   Stress fracture of right tibia 03/27/2018   SVD (spontaneous vaginal delivery)    x 2   Tubular adenoma of colon 05/09/12   "innumerable number" of flat polyps    Past Surgical History:  Procedure Laterality Date   CARPAL TUNNEL RELEASE Left 10/20/14   [facility]   CATARACT EXTRACTION, BILATERAL     CESAREAN SECTION  1977   CHOLECYSTECTOMY  1990   COLONOSCOPY     IR RADIOLOGIST EVAL & MGMT  07/16/2019   KNEE ARTHROSCOPY WITH SUBCHONDROPLASTY Right 03/27/2018   Procedure: Right knee arthroscopic partial medial meniscus repair versus menisectomy with medial tibia subchondroplasty;  Surgeon: Yolonda Kida, MD;  Location: [facility] OR;  Service: Orthopedics;  Laterality: Right;  120 mins   LARYNX SURGERY     POLYP REMOVAL   MULTIPLE TOOTH EXTRACTIONS     POLYPECTOMY     vocal cord   TONSILLECTOMY  1965   TUBAL LIGATION     TUMOR REMOVAL     benign fatty tumors on abdomen   UPPER GASTROINTESTINAL ENDOSCOPY      Family History  Problem Relation Age of Onset   Heart failure Mother        d.37   Heart disease Mother    Prostate cancer Father        d.89 diagnosed in late-60s   Colon cancer Father        diagnosed at 38   Leukemia Maternal Aunt    Heart failure Maternal Uncle        d.50   Stomach cancer Paternal Aunt        questionable   Colon cancer Paternal Uncle        in his 44s   Heart failure Maternal Grandmother        d.45   Kidney cancer Cousin        d.78s maternal first-cousin   Colon polyps Brother        polyposis. Underwent partial colectomy at age 75.   Spina bifida Brother        died at birth   Colon polyps Son 86   Heart attack Son 80   Other Other 16  multiple lipomas   Esophageal cancer Neg Hx    Rectal cancer Neg Hx     Breast cancer Neg Hx     Social History   Socioeconomic History   Marital status: Married    Spouse name: Molly Maduro "goes by [employer]"   Number of children: 3   Years of education: 11   Highest education level: GED or equivalent  Occupational History   Occupation: retired  Tobacco Use   Smoking status: Former    Current packs/day: 0.00    Average packs/day: 1 pack/day for 3.0 years (3.0 ttl pk-yrs)    Types: Cigarettes    Start date: 08/15/1966    Quit date: 08/15/1969    Years since quitting: 53.7   Smokeless tobacco: Never  Vaping Use   Vaping status: Never Used  Substance and Sexual Activity   Alcohol use: No    Alcohol/week: 0.0 standard drinks of alcohol   Drug use: No   Sexual activity: Not Currently  Other Topics Concern   Not on file  Social History Narrative   Daughter lives with them and helps a lot   Social Determinants of Health   Financial Resource Strain: Low Risk  (12/16/2022)   Overall Financial Resource Strain (CARDIA)    Difficulty of Paying Living Expenses: Not hard at all  Food Insecurity: No Food Insecurity (12/16/2022)   Hunger Vital Sign    Worried About Running Out of Food in the Last Year: Never true    Ran Out of Food in the Last Year: Never true  Transportation Needs: No Transportation Needs (12/16/2022)   PRAPARE - Administrator, Civil Service (Medical): No    Lack of Transportation (Non-Medical): No  Physical Activity: Inactive (12/16/2022)   Exercise Vital Sign    Days of Exercise per Week: 0 days    Minutes of Exercise per Session: 0 min  Stress: No Stress Concern Present (12/16/2022)   [employer] of Occupational Health - Occupational Stress Questionnaire    Feeling of Stress : Not at all  Social Connections: Socially Integrated (12/16/2022)   Social Connection and Isolation Panel [NHANES]    Frequency of Communication with Friends and Family: More than three times a week    Frequency of Social Gatherings with Friends and  Family: More than three times a week    Attends Religious Services: More than 4 times per year    Active Member of [employer] or Organizations: Yes    Attends Engineer, structural: More than 4 times per year    Marital Status: Married  Catering manager Violence: Not At Risk (12/16/2022)   Humiliation, Afraid, Rape, and Kick questionnaire    Fear of Current or Ex-Partner: No    Emotionally Abused: No    Physically Abused: No    Sexually Abused: No    Review of systems: Review of Systems  Constitutional:  Negative for unexpected weight change.  HENT:  Negative for trouble swallowing.   Cardiovascular:  Positive for leg swelling.  Gastrointestinal:  Positive for abdominal pain. Negative for abdominal distention, anal bleeding, blood in stool, constipation, diarrhea, nausea, rectal pain and vomiting.    Physical Exam: General: well-appearing   Eyes: sclera anicteric, no redness ENT: oral mucosa moist without lesions, no cervical or supraclavicular lymphadenopathy CV: RRR, no JVD,  peripheral edema Resp: clear to auscultation bilaterally, normal RR and effort noted GI: soft, no tenderness, with active bowel sounds. No guarding or palpable organomegaly noted. Skin; warm and dry,  no rash or jaundice noted Neuro: awake, alert and oriented x 3. Normal gross motor function and fluent speech    Data Reviewed:  Reviewed labs, radiology imaging, old records and pertinent past GI work up   Assessment and Plan/Recommendations:  77 year old very pleasant with history of Stacey Summers cirrhosis decompensated with hepatic encephalopathy Worsening renal function, CKD stage III  MELD 3.0: 21 at 04/19/2023  2:26 PM MELD-Na: 18 at 04/19/2023  2:26 PM  Fluid overload secondary to to [place] cirrhosis, and CHF: Diuretics managed by cardiology and nephrology, she also has chronic kidney disease Currently on Aldactone 25 mg daily, advised patient to increase dose to 50 mg daily given she is having increased  peripheral edema and abdominal distention Follow-up BMP next week, has appointment with nephrology  Chronic fatigue secondary to comorbid conditions including cirrhosis Vitamin D and B12 level within normal range Advised patient to start taking daily multivitamin   Hepatocellular screening: No hepatic lesions concerning for [facility] on imaging.  AFP within normal range   Chronic thrombocytopenia: Secondary to portal hypertension in the setting of [place] cirrhosis   Hepatic encephalopathy: Continue Xifaxan twice daily and lactulose as needed with goal 3 soft bowel movements daily   Small esophageal varices on EGD in January 2021  Continue with low-carb and low-fat diet, avoid artificial sweeteners, high fructose corn syrup. Daily exercise as tolerated.  History of advanced adenomatous colon polyps Discussed with patient that given her age and comorbidities with cirrhosis, thrombocytopenia and coagulopathy, is at significant risk for complications associated with procedure and anesthesia.  Do not recommend ongoing surveillance colonoscopy due to her age and comorbidities   Follow up in 4 -6 months    The patient was provided an opportunity to ask questions and all were answered. The patient agreed with the plan and demonstrated an understanding of the instructions.  Iona Beard , MD    CC: [name], DO  I,Safa M Kadhim,acting as a scribe for Marsa Aris, MD.,have documented all relevant documentation on the behalf of Marsa Aris, MD,as directed by  Marsa Aris, MD while in the presence of Marsa Aris, MD.   I, Marsa Aris, MD, have reviewed all documentation for this visit. The documentation on 04/19/23 for the exam, diagnosis, procedures, and orders are all accurate and complete.

## 2023-04-19 NOTE — Patient Instructions (Addendum)
Your provider has requested that you go to the basement level for lab work before leaving today. Press "B" on the elevator. The lab is located at the first door on the left as you exit the elevator.   Increase Aldactone to 50 mg daily  We have sent the following medications to your pharmacy for you to pick up at your convenience:  Aldactone 50 mg  Follow up BMP with nephrology  Follow up in 4-6 months  Due to recent changes in healthcare laws, you may see the results of your imaging and laboratory studies on MyChart before your provider has had a chance to review them.  We understand that in some cases there may be results that are confusing or concerning to you. Not all laboratory results come back in the same time frame and the provider may be waiting for multiple results in order to interpret others.  Please give [place] 48 hours in order for your provider to thoroughly review all the results before contacting the office for clarification of your results.    _______________________________________________________  If your blood pressure at your visit was 140/90 or greater, please contact your primary care physician to follow up on this.  _______________________________________________________  If you are age 52 or older, your body mass index should be between 23-30. Your Body mass index is 43.27 kg/m. If this is out of the aforementioned range listed, please consider follow up with your Primary Care Provider.  If you are age 59 or younger, your body mass index should be between 19-25. Your Body mass index is 43.27 kg/m. If this is out of the aformentioned range listed, please consider follow up with your Primary Care Provider.   ________________________________________________________  The [facility] providers would like to encourage you to use [facility] to communicate with providers for non-urgent requests or questions.  Due to long hold times on the telephone, sending your provider a  message by [facility] may be a faster and more efficient way to get a response.  Please allow 48 business hours for a response.  Please remember that this is for non-urgent requests.  _______________________________________________________   I appreciate the  opportunity to care for you  Thank You   Marsa Aris , MD

## 2023-04-21 DIAGNOSIS — R809 Proteinuria, unspecified: Secondary | ICD-10-CM | POA: Diagnosis not present

## 2023-04-21 DIAGNOSIS — N1831 Chronic kidney disease, stage 3a: Secondary | ICD-10-CM | POA: Diagnosis not present

## 2023-04-21 DIAGNOSIS — E1129 Type 2 diabetes mellitus with other diabetic kidney complication: Secondary | ICD-10-CM | POA: Diagnosis not present

## 2023-04-21 DIAGNOSIS — E1122 Type 2 diabetes mellitus with diabetic chronic kidney disease: Secondary | ICD-10-CM | POA: Diagnosis not present

## 2023-04-21 LAB — AFP TUMOR MARKER: AFP-Tumor Marker: 5.3 ng/mL

## 2023-05-01 ENCOUNTER — Encounter: Payer: Self-pay | Admitting: Gastroenterology

## 2023-05-03 ENCOUNTER — Other Ambulatory Visit: Payer: PPO

## 2023-05-03 DIAGNOSIS — N179 Acute kidney failure, unspecified: Secondary | ICD-10-CM | POA: Diagnosis not present

## 2023-05-03 DIAGNOSIS — I5032 Chronic diastolic (congestive) heart failure: Secondary | ICD-10-CM | POA: Diagnosis not present

## 2023-05-03 DIAGNOSIS — K7460 Unspecified cirrhosis of liver: Secondary | ICD-10-CM | POA: Diagnosis not present

## 2023-05-03 DIAGNOSIS — I952 Hypotension due to drugs: Secondary | ICD-10-CM | POA: Diagnosis not present

## 2023-05-03 DIAGNOSIS — N189 Chronic kidney disease, unspecified: Secondary | ICD-10-CM | POA: Diagnosis not present

## 2023-05-03 DIAGNOSIS — D6959 Other secondary thrombocytopenia: Secondary | ICD-10-CM | POA: Diagnosis not present

## 2023-05-03 DIAGNOSIS — E1129 Type 2 diabetes mellitus with other diabetic kidney complication: Secondary | ICD-10-CM | POA: Diagnosis not present

## 2023-05-10 ENCOUNTER — Other Ambulatory Visit: Payer: Self-pay | Admitting: Cardiovascular Disease

## 2023-05-14 ENCOUNTER — Other Ambulatory Visit: Payer: Self-pay | Admitting: Gastroenterology

## 2023-05-19 DIAGNOSIS — I5032 Chronic diastolic (congestive) heart failure: Secondary | ICD-10-CM | POA: Diagnosis not present

## 2023-05-19 DIAGNOSIS — E1129 Type 2 diabetes mellitus with other diabetic kidney complication: Secondary | ICD-10-CM | POA: Diagnosis not present

## 2023-05-19 DIAGNOSIS — R809 Proteinuria, unspecified: Secondary | ICD-10-CM | POA: Diagnosis not present

## 2023-05-19 DIAGNOSIS — E1122 Type 2 diabetes mellitus with diabetic chronic kidney disease: Secondary | ICD-10-CM | POA: Diagnosis not present

## 2023-05-30 ENCOUNTER — Inpatient Hospital Stay: Payer: PPO | Attending: Hematology

## 2023-05-30 VITALS — BP 158/73 | HR 65 | Temp 97.9°F | Resp 18

## 2023-05-30 DIAGNOSIS — E538 Deficiency of other specified B group vitamins: Secondary | ICD-10-CM | POA: Diagnosis not present

## 2023-05-30 MED ORDER — CYANOCOBALAMIN 1000 MCG/ML IJ SOLN
1000.0000 ug | Freq: Once | INTRAMUSCULAR | Status: AC
  Administered 2023-05-30: 1000 ug via INTRAMUSCULAR
  Filled 2023-05-30: qty 1

## 2023-05-30 NOTE — Progress Notes (Signed)
Stacey Summers presents today for injection per the provider's orders.  B12 administration without incident; injection site WNL; see MAR for injection details.  Patient tolerated procedure well and without incident. No complaints at this time. Discharged from clinic by wheel chair in stable condition. Alert and oriented x 3. F/U with [facility] as scheduled.

## 2023-05-30 NOTE — Patient Instructions (Signed)
[facility]  Discharge Instructions: Thank you for choosing [facility] to provide your oncology and hematology care.  If you have a lab appointment with the Cancer Center - please note that after April 8th, 2024, all labs will be drawn in the cancer center.  You do not have to check in or register with the main entrance as you have in the past but will complete your check-in in the cancer center.  Wear comfortable clothing and clothing appropriate for easy access to any Portacath or PICC line.   We strive to give you quality time with your provider. You may need to reschedule your appointment if you arrive late (15 or more minutes).  Arriving late affects you and other patients whose appointments are after yours.  Also, if you miss three or more appointments without notifying the office, you may be dismissed from the clinic at the provider's discretion.      For prescription refill requests, have your pharmacy contact our office and allow 72 hours for refills to be completed.    Today you received the following chemotherapy and/or immunotherapy agents  B12 injection. Vitamin B12 Injection What is this medication? Vitamin B12 (VAHY tuh min B12) prevents and treats low vitamin B12 levels in your body. It is used in people who do not get enough vitamin B12 from their diet or when their digestive tract does not absorb enough. Vitamin B12 plays an important role in maintaining the health of your nervous system and red blood cells. This medicine may be used for other purposes; ask your health care provider or pharmacist if you have questions. COMMON BRAND NAME(S): B-12 Compliance Kit, B-12 Injection Kit, Cyomin, Dodex, LA-12, Nutri-Twelve, Physicians EZ Use B-12, Primabalt, Vitamin Deficiency Injectable System - B12 What should I tell my care team before I take this medication? They need to know if you have any of these conditions: Kidney disease Leber's  disease Megaloblastic anemia An unusual or allergic reaction to cyanocobalamin, cobalt, other medications, foods, dyes, or preservatives Pregnant or trying to get pregnant Breast-feeding How should I use this medication? This medication is injected into a muscle or deeply under the skin. It is usually given in a clinic or care team's office. However, your care team may teach you how to inject yourself. Follow all instructions. Talk to your care team about the use of this medication in children. Special care may be needed. Overdosage: If you think you have taken too much of this medicine contact a poison control center or emergency room at once. NOTE: This medicine is only for you. Do not share this medicine with others. What if I miss a dose? If you are given your dose at a clinic or care team's office, call to reschedule your appointment. If you give your own injections, and you miss a dose, take it as soon as you can. If it is almost time for your next dose, take only that dose. Do not take double or extra doses. What may interact with this medication? Alcohol Colchicine This list may not describe all possible interactions. Give your health care provider a list of all the medicines, herbs, non-prescription drugs, or dietary supplements you use. Also tell them if you smoke, drink alcohol, or use illegal drugs. Some items may interact with your medicine. What should I watch for while using this medication? Visit your care team regularly. You may need blood work done while you are taking this medication. You may need  to follow a special diet. Talk to your care team. Limit your alcohol intake and avoid smoking to get the best benefit. What side effects may I notice from receiving this medication? Side effects that you should report to your care team as soon as possible: Allergic reactions--skin rash, itching, hives, swelling of the face, lips, tongue, or throat Swelling of the ankles, hands, or  feet Trouble breathing Side effects that usually do not require medical attention (report to your care team if they continue or are bothersome): Diarrhea This list may not describe all possible side effects. Call your doctor for medical advice about side effects. You may report side effects to FDA at 1-800-FDA-1088. Where should I keep my medication? Keep out of the reach of children. Store at room temperature between 15 and 30 degrees C (59 and 85 degrees F). Protect from light. Throw away any unused medication after the expiration date. NOTE: This sheet is a summary. It may not cover all possible information. If you have questions about this medicine, talk to your doctor, pharmacist, or health care provider.  2024 Elsevier/Gold Standard (2021-04-13 00:00:00)       To help prevent nausea and vomiting after your treatment, we encourage you to take your nausea medication as directed.  BELOW ARE SYMPTOMS THAT SHOULD BE REPORTED IMMEDIATELY: *FEVER GREATER THAN 100.4 F (38 C) OR HIGHER *CHILLS OR SWEATING *NAUSEA AND VOMITING THAT IS NOT CONTROLLED WITH YOUR NAUSEA MEDICATION *UNUSUAL SHORTNESS OF BREATH *UNUSUAL BRUISING OR BLEEDING *URINARY PROBLEMS (pain or burning when urinating, or frequent urination) *BOWEL PROBLEMS (unusual diarrhea, constipation, pain near the anus) TENDERNESS IN MOUTH AND THROAT WITH OR WITHOUT PRESENCE OF ULCERS (sore throat, sores in mouth, or a toothache) UNUSUAL RASH, SWELLING OR PAIN  UNUSUAL VAGINAL DISCHARGE OR ITCHING   Items with * indicate a potential emergency and should be followed up as soon as possible or go to the Emergency Department if any problems should occur.  Please show the CHEMOTHERAPY ALERT CARD or IMMUNOTHERAPY ALERT CARD at check-in to the Emergency Department and triage nurse.  Should you have questions after your visit or need to cancel or reschedule your appointment, please contact [facility]ENTER AT [facility] [phone]  and  follow the prompts.  Office hours are 8:00 a.m. to 4:30 p.m. Monday - Friday. Please note that voicemails left after 4:00 p.m. may not be returned until the following business day.  We are closed weekends and major holidays. You have access to a nurse at all times for urgent questions. Please call the main number to the clinic [phone] and follow the prompts.  For any non-urgent questions, you may also contact your provider using MyChart. We now offer e-Visits for anyone 75 and older to request care online for non-urgent symptoms. For details visit [email or web address].   Also download the MyChart app! Go to the app store, search "MyChart", open the app, select [place], and log in with your MyChart username and password.

## 2023-06-12 ENCOUNTER — Other Ambulatory Visit: Payer: Self-pay

## 2023-06-12 DIAGNOSIS — R600 Localized edema: Secondary | ICD-10-CM

## 2023-06-12 MED ORDER — FUROSEMIDE 40 MG PO TABS: ORAL_TABLET | ORAL | 2 refills | Status: DC

## 2023-06-25 ENCOUNTER — Other Ambulatory Visit: Payer: Self-pay | Admitting: Family Medicine

## 2023-06-25 DIAGNOSIS — G8929 Other chronic pain: Secondary | ICD-10-CM

## 2023-06-26 NOTE — Telephone Encounter (Signed)
Tramadol  Last refill 11/25/22, #30, 2 refills  Flexeril  Last refill 01/31/22, #30, no refills  Last office visit 03/27/23 Upcoming appointment 07/31/23

## 2023-07-01 ENCOUNTER — Emergency Department (HOSPITAL_COMMUNITY): Payer: PPO

## 2023-07-01 ENCOUNTER — Encounter (HOSPITAL_COMMUNITY): Payer: Self-pay

## 2023-07-01 ENCOUNTER — Inpatient Hospital Stay (HOSPITAL_COMMUNITY): Payer: PPO

## 2023-07-01 ENCOUNTER — Other Ambulatory Visit: Payer: Self-pay

## 2023-07-01 ENCOUNTER — Inpatient Hospital Stay (HOSPITAL_COMMUNITY)
Admission: EM | Admit: 2023-07-01 | Discharge: 2023-07-03 | DRG: 152 | Disposition: A | Discharge status: Home / Self Care | Payer: PPO | Attending: Family Medicine | Admitting: Family Medicine

## 2023-07-01 DIAGNOSIS — H9190 Unspecified hearing loss, unspecified ear: Secondary | ICD-10-CM | POA: Diagnosis present

## 2023-07-01 DIAGNOSIS — J449 Chronic obstructive pulmonary disease, unspecified: Secondary | ICD-10-CM | POA: Diagnosis not present

## 2023-07-01 DIAGNOSIS — R6520 Severe sepsis without septic shock: Secondary | ICD-10-CM | POA: Diagnosis not present

## 2023-07-01 DIAGNOSIS — Z8051 Family history of malignant neoplasm of kidney: Secondary | ICD-10-CM

## 2023-07-01 DIAGNOSIS — E66813 Obesity, class 3: Secondary | ICD-10-CM | POA: Diagnosis present

## 2023-07-01 DIAGNOSIS — R4182 Altered mental status, unspecified: Secondary | ICD-10-CM | POA: Diagnosis not present

## 2023-07-01 DIAGNOSIS — G9341 Metabolic encephalopathy: Secondary | ICD-10-CM | POA: Diagnosis present

## 2023-07-01 DIAGNOSIS — D72829 Elevated white blood cell count, unspecified: Secondary | ICD-10-CM | POA: Diagnosis not present

## 2023-07-01 DIAGNOSIS — Z8249 Family history of ischemic heart disease and other diseases of the circulatory system: Secondary | ICD-10-CM | POA: Diagnosis not present

## 2023-07-01 DIAGNOSIS — K7460 Unspecified cirrhosis of liver: Secondary | ICD-10-CM | POA: Diagnosis present

## 2023-07-01 DIAGNOSIS — Z8042 Family history of malignant neoplasm of prostate: Secondary | ICD-10-CM

## 2023-07-01 DIAGNOSIS — E1122 Type 2 diabetes mellitus with diabetic chronic kidney disease: Secondary | ICD-10-CM | POA: Diagnosis not present

## 2023-07-01 DIAGNOSIS — A419 Sepsis, unspecified organism: Secondary | ICD-10-CM | POA: Diagnosis not present

## 2023-07-01 DIAGNOSIS — I152 Hypertension secondary to endocrine disorders: Secondary | ICD-10-CM | POA: Diagnosis present

## 2023-07-01 DIAGNOSIS — R0981 Nasal congestion: Secondary | ICD-10-CM

## 2023-07-01 DIAGNOSIS — Z800 Family history of malignant neoplasm of digestive organs: Secondary | ICD-10-CM

## 2023-07-01 DIAGNOSIS — R601 Generalized edema: Secondary | ICD-10-CM | POA: Diagnosis not present

## 2023-07-01 DIAGNOSIS — E1169 Type 2 diabetes mellitus with other specified complication: Secondary | ICD-10-CM | POA: Diagnosis present

## 2023-07-01 DIAGNOSIS — F419 Anxiety disorder, unspecified: Secondary | ICD-10-CM | POA: Diagnosis not present

## 2023-07-01 DIAGNOSIS — N179 Acute kidney failure, unspecified: Secondary | ICD-10-CM | POA: Diagnosis not present

## 2023-07-01 DIAGNOSIS — Z634 Disappearance and death of family member: Secondary | ICD-10-CM

## 2023-07-01 DIAGNOSIS — M797 Fibromyalgia: Secondary | ICD-10-CM | POA: Diagnosis present

## 2023-07-01 DIAGNOSIS — Z87891 Personal history of nicotine dependence: Secondary | ICD-10-CM | POA: Diagnosis not present

## 2023-07-01 DIAGNOSIS — Z6841 Body Mass Index (BMI) 40.0 and over, adult: Secondary | ICD-10-CM

## 2023-07-01 DIAGNOSIS — N1831 Chronic kidney disease, stage 3a: Secondary | ICD-10-CM | POA: Diagnosis not present

## 2023-07-01 DIAGNOSIS — K7581 Nonalcoholic steatohepatitis (NASH): Secondary | ICD-10-CM | POA: Diagnosis not present

## 2023-07-01 DIAGNOSIS — J438 Other emphysema: Secondary | ICD-10-CM | POA: Diagnosis not present

## 2023-07-01 DIAGNOSIS — J069 Acute upper respiratory infection, unspecified: Principal | ICD-10-CM | POA: Diagnosis present

## 2023-07-01 DIAGNOSIS — K219 Gastro-esophageal reflux disease without esophagitis: Secondary | ICD-10-CM | POA: Diagnosis present

## 2023-07-01 DIAGNOSIS — E785 Hyperlipidemia, unspecified: Secondary | ICD-10-CM | POA: Diagnosis not present

## 2023-07-01 DIAGNOSIS — R600 Localized edema: Secondary | ICD-10-CM

## 2023-07-01 DIAGNOSIS — G4733 Obstructive sleep apnea (adult) (pediatric): Secondary | ICD-10-CM | POA: Diagnosis not present

## 2023-07-01 DIAGNOSIS — E1159 Type 2 diabetes mellitus with other circulatory complications: Secondary | ICD-10-CM | POA: Diagnosis not present

## 2023-07-01 DIAGNOSIS — I5032 Chronic diastolic (congestive) heart failure: Secondary | ICD-10-CM | POA: Diagnosis present

## 2023-07-01 DIAGNOSIS — Z83719 Family history of colon polyps, unspecified: Secondary | ICD-10-CM

## 2023-07-01 DIAGNOSIS — I5033 Acute on chronic diastolic (congestive) heart failure: Secondary | ICD-10-CM | POA: Diagnosis present

## 2023-07-01 DIAGNOSIS — T502X5A Adverse effect of carbonic-anhydrase inhibitors, benzothiadiazides and other diuretics, initial encounter: Secondary | ICD-10-CM | POA: Diagnosis present

## 2023-07-01 DIAGNOSIS — I700 Atherosclerosis of aorta: Secondary | ICD-10-CM | POA: Diagnosis not present

## 2023-07-01 DIAGNOSIS — D696 Thrombocytopenia, unspecified: Secondary | ICD-10-CM | POA: Diagnosis present

## 2023-07-01 DIAGNOSIS — I10 Essential (primary) hypertension: Secondary | ICD-10-CM | POA: Diagnosis not present

## 2023-07-01 DIAGNOSIS — E119 Type 2 diabetes mellitus without complications: Secondary | ICD-10-CM

## 2023-07-01 DIAGNOSIS — Z1152 Encounter for screening for COVID-19: Secondary | ICD-10-CM

## 2023-07-01 DIAGNOSIS — R410 Disorientation, unspecified: Secondary | ICD-10-CM | POA: Diagnosis not present

## 2023-07-01 DIAGNOSIS — R0689 Other abnormalities of breathing: Secondary | ICD-10-CM | POA: Diagnosis not present

## 2023-07-01 DIAGNOSIS — Z91040 Latex allergy status: Secondary | ICD-10-CM

## 2023-07-01 DIAGNOSIS — R509 Fever, unspecified: Secondary | ICD-10-CM | POA: Diagnosis not present

## 2023-07-01 DIAGNOSIS — J811 Chronic pulmonary edema: Secondary | ICD-10-CM | POA: Diagnosis not present

## 2023-07-01 DIAGNOSIS — J329 Chronic sinusitis, unspecified: Secondary | ICD-10-CM

## 2023-07-01 DIAGNOSIS — Z860101 Personal history of adenomatous and serrated colon polyps: Secondary | ICD-10-CM

## 2023-07-01 DIAGNOSIS — Z806 Family history of leukemia: Secondary | ICD-10-CM

## 2023-07-01 LAB — CBC WITH DIFFERENTIAL/PLATELET
Abs Immature Granulocytes: 0.09 10*3/uL — ABNORMAL HIGH (ref 0.00–0.07)
Basophils Absolute: 0 10*3/uL (ref 0.0–0.1)
Basophils Relative: 0 %
Eosinophils Absolute: 0 10*3/uL (ref 0.0–0.5)
Eosinophils Relative: 0 %
HCT: 40.7 % (ref 36.0–46.0)
Hemoglobin: 13.5 g/dL (ref 12.0–15.0)
Immature Granulocytes: 1 %
Lymphocytes Relative: 7 %
Lymphs Abs: 1 10*3/uL (ref 0.7–4.0)
MCH: 34.2 pg — ABNORMAL HIGH (ref 26.0–34.0)
MCHC: 33.2 g/dL (ref 30.0–36.0)
MCV: 103 fL — ABNORMAL HIGH (ref 80.0–100.0)
Monocytes Absolute: 1.1 10*3/uL — ABNORMAL HIGH (ref 0.1–1.0)
Monocytes Relative: 7 %
Neutro Abs: 12.5 10*3/uL — ABNORMAL HIGH (ref 1.7–7.7)
Neutrophils Relative %: 85 %
Platelets: 59 10*3/uL — ABNORMAL LOW (ref 150–400)
RBC: 3.95 MIL/uL (ref 3.87–5.11)
RDW: 13.1 % (ref 11.5–15.5)
WBC: 14.6 10*3/uL — ABNORMAL HIGH (ref 4.0–10.5)
nRBC: 0 % (ref 0.0–0.2)

## 2023-07-01 LAB — COMPREHENSIVE METABOLIC PANEL
ALT: 22 U/L (ref 0–44)
AST: 43 U/L — ABNORMAL HIGH (ref 15–41)
Albumin: 2.8 g/dL — ABNORMAL LOW (ref 3.5–5.0)
Alkaline Phosphatase: 65 U/L (ref 38–126)
Anion gap: 11 (ref 5–15)
BUN: 38 mg/dL — ABNORMAL HIGH (ref 8–23)
CO2: 23 mmol/L (ref 22–32)
Calcium: 8.6 mg/dL — ABNORMAL LOW (ref 8.9–10.3)
Chloride: 106 mmol/L (ref 98–111)
Creatinine, Ser: 1.4 mg/dL — ABNORMAL HIGH (ref 0.44–1.00)
GFR, Estimated: 39 mL/min — ABNORMAL LOW (ref >60)
Glucose, Bld: 164 mg/dL — ABNORMAL HIGH (ref 70–99)
Potassium: 4.2 mmol/L (ref 3.5–5.1)
Sodium: 140 mmol/L (ref 135–145)
Total Bilirubin: 5.7 mg/dL — ABNORMAL HIGH (ref <1.2)
Total Protein: 5.7 g/dL — ABNORMAL LOW (ref 6.5–8.1)

## 2023-07-01 LAB — RESP PANEL BY RT-PCR (RSV, FLU A&B, COVID)  RVPGX2
Influenza A by PCR: NEGATIVE
Influenza B by PCR: NEGATIVE
Resp Syncytial Virus by PCR: NEGATIVE
SARS Coronavirus 2 by RT PCR: NEGATIVE

## 2023-07-01 LAB — URINALYSIS, ROUTINE W REFLEX MICROSCOPIC
Bacteria, UA: NONE SEEN
Bilirubin Urine: NEGATIVE
Glucose, UA: NEGATIVE mg/dL
Ketones, ur: NEGATIVE mg/dL
Leukocytes,Ua: NEGATIVE
Nitrite: NEGATIVE
Protein, ur: 100 mg/dL — AB
Specific Gravity, Urine: 1.021 (ref 1.005–1.030)
pH: 5 (ref 5.0–8.0)

## 2023-07-01 LAB — MRSA NEXT GEN BY PCR, NASAL: MRSA by PCR Next Gen: NOT DETECTED

## 2023-07-01 LAB — BRAIN NATRIURETIC PEPTIDE: B Natriuretic Peptide: 901 pg/mL — ABNORMAL HIGH (ref 0.0–100.0)

## 2023-07-01 LAB — LACTIC ACID, PLASMA
Lactic Acid, Venous: 2.3 mmol/L (ref 0.5–1.9)
Lactic Acid, Venous: 2.1 mmol/L (ref 0.5–1.9)

## 2023-07-01 LAB — GLUCOSE, CAPILLARY
Glucose-Capillary: 203 mg/dL — ABNORMAL HIGH (ref 70–99)
Glucose-Capillary: 225 mg/dL — ABNORMAL HIGH (ref 70–99)

## 2023-07-01 LAB — AMMONIA: Ammonia: 14 umol/L (ref 9–35)

## 2023-07-01 LAB — GROUP A STREP BY PCR: Group A Strep by PCR: NOT DETECTED

## 2023-07-01 LAB — PROCALCITONIN: Procalcitonin: 1.13 ng/mL

## 2023-07-01 MED ORDER — FUROSEMIDE 10 MG/ML IJ SOLN
60.0000 mg | Freq: Two times a day (BID) | INTRAMUSCULAR | Status: DC
  Filled 2023-07-01: qty 6

## 2023-07-01 MED ORDER — POLYETHYLENE GLYCOL 3350 17 G PO PACK: 17.0000 g | PACK | Freq: Every day | ORAL | Status: DC | PRN

## 2023-07-01 MED ORDER — VANCOMYCIN HCL 1500 MG/300ML IV SOLN: 1500.0000 mg | INTRAVENOUS | Status: DC

## 2023-07-01 MED ORDER — SODIUM CHLORIDE 0.9 % IV SOLN
2.0000 g | Freq: Two times a day (BID) | INTRAVENOUS | Status: DC
  Administered 2023-07-02: 2 g via INTRAVENOUS
  Filled 2023-07-01: qty 12.5

## 2023-07-01 MED ORDER — INSULIN ASPART 100 UNIT/ML IJ SOLN
0.0000 [IU] | Freq: Three times a day (TID) | INTRAMUSCULAR | Status: DC
  Administered 2023-07-02: 1 [IU] via SUBCUTANEOUS

## 2023-07-01 MED ORDER — ALBUTEROL SULFATE (2.5 MG/3ML) 0.083% IN NEBU: 2.5000 mg | INHALATION_SOLUTION | Freq: Four times a day (QID) | RESPIRATORY_TRACT | Status: DC | PRN

## 2023-07-01 MED ORDER — SPIRONOLACTONE 25 MG PO TABS
50.0000 mg | ORAL_TABLET | Freq: Every day | ORAL | Status: DC
  Filled 2023-07-01: qty 2

## 2023-07-01 MED ORDER — ONDANSETRON HCL 4 MG/2ML IJ SOLN: 4.0000 mg | Freq: Four times a day (QID) | INTRAMUSCULAR | Status: DC | PRN

## 2023-07-01 MED ORDER — VANCOMYCIN HCL IN DEXTROSE 1-5 GM/200ML-% IV SOLN: 1000.0000 mg | Freq: Once | INTRAVENOUS | Status: DC

## 2023-07-01 MED ORDER — LACTULOSE 10 GM/15ML PO SOLN
20.0000 g | Freq: Three times a day (TID) | ORAL | Status: DC
  Administered 2023-07-01 – 2023-07-03 (×5): 20 g via ORAL
  Filled 2023-07-01 (×6): qty 30

## 2023-07-01 MED ORDER — IBUPROFEN 400 MG PO TABS
400.0000 mg | ORAL_TABLET | Freq: Once | ORAL | Status: AC
  Administered 2023-07-01: 400 mg via ORAL
  Filled 2023-07-01: qty 1

## 2023-07-01 MED ORDER — ALPRAZOLAM 0.25 MG PO TABS
0.2500 mg | ORAL_TABLET | Freq: Two times a day (BID) | ORAL | Status: DC | PRN
  Administered 2023-07-03: 0.25 mg via ORAL
  Filled 2023-07-01: qty 1

## 2023-07-01 MED ORDER — FUROSEMIDE 10 MG/ML IJ SOLN
40.0000 mg | Freq: Once | INTRAMUSCULAR | Status: AC
  Administered 2023-07-01: 40 mg via INTRAVENOUS
  Filled 2023-07-01: qty 4

## 2023-07-01 MED ORDER — ACETAMINOPHEN 325 MG PO TABS: 650.0000 mg | ORAL_TABLET | Freq: Four times a day (QID) | ORAL | Status: DC | PRN

## 2023-07-01 MED ORDER — RIFAXIMIN 550 MG PO TABS
550.0000 mg | ORAL_TABLET | Freq: Two times a day (BID) | ORAL | Status: DC
  Administered 2023-07-01 – 2023-07-03 (×4): 550 mg via ORAL
  Filled 2023-07-01 (×4): qty 1

## 2023-07-01 MED ORDER — FLUTICASONE PROPIONATE 50 MCG/ACT NA SUSP
2.0000 | Freq: Every day | NASAL | Status: DC
  Administered 2023-07-01 – 2023-07-03 (×3): 2 via NASAL
  Filled 2023-07-01: qty 16

## 2023-07-01 MED ORDER — DILTIAZEM HCL ER COATED BEADS 120 MG PO CP24
120.0000 mg | ORAL_CAPSULE | Freq: Every day | ORAL | Status: DC
  Administered 2023-07-02 – 2023-07-03 (×2): 120 mg via ORAL
  Filled 2023-07-01 (×2): qty 1

## 2023-07-01 MED ORDER — INFLUENZA VAC A&B SURF ANT ADJ 0.5 ML IM SUSY: 0.5000 mL | PREFILLED_SYRINGE | INTRAMUSCULAR | Status: DC | PRN

## 2023-07-01 MED ORDER — ACETAMINOPHEN 500 MG PO TABS
1000.0000 mg | ORAL_TABLET | Freq: Once | ORAL | Status: AC
  Administered 2023-07-01: 1000 mg via ORAL
  Filled 2023-07-01: qty 2

## 2023-07-01 MED ORDER — ONDANSETRON HCL 4 MG PO TABS: 4.0000 mg | ORAL_TABLET | Freq: Four times a day (QID) | ORAL | Status: DC | PRN

## 2023-07-01 MED ORDER — METRONIDAZOLE 500 MG/100ML IV SOLN
500.0000 mg | Freq: Once | INTRAVENOUS | Status: AC
  Administered 2023-07-01: 500 mg via INTRAVENOUS
  Filled 2023-07-01: qty 100

## 2023-07-01 MED ORDER — ACETAMINOPHEN 650 MG RE SUPP: 650.0000 mg | Freq: Four times a day (QID) | RECTAL | Status: DC | PRN

## 2023-07-01 MED ORDER — INSULIN ASPART 100 UNIT/ML IJ SOLN
0.0000 [IU] | Freq: Every day | INTRAMUSCULAR | Status: DC
  Administered 2023-07-01: 2 [IU] via SUBCUTANEOUS

## 2023-07-01 MED ORDER — SODIUM CHLORIDE 0.9 % IV SOLN
2.0000 g | Freq: Once | INTRAVENOUS | Status: AC
  Administered 2023-07-01: 2 g via INTRAVENOUS
  Filled 2023-07-01: qty 12.5

## 2023-07-01 MED ORDER — VANCOMYCIN HCL IN DEXTROSE 1-5 GM/200ML-% IV SOLN
1000.0000 mg | INTRAVENOUS | Status: AC
  Administered 2023-07-01 (×2): 1000 mg via INTRAVENOUS
  Filled 2023-07-01 (×2): qty 200

## 2023-07-01 NOTE — Progress Notes (Signed)
Does not wear CPAP at home , refused for now.

## 2023-07-01 NOTE — ED Triage Notes (Signed)
EMS called out for AMS and running fever of 100.8. EMS states VS were WNL in route to hospital. CBG 174 per EMS

## 2023-07-01 NOTE — Progress Notes (Signed)
Pharmacy Antibiotic Note  Stacey Summers is a 77 y.o. female admitted on 07/01/2023 with  unknown source of infection .  Pharmacy has been consulted for vancomycin and cefepime dosing.  Plan: Vancomycin 2000 mg IV x 1 dose. Vancomycin 1500 mg IV every 48 hours. Cefepime 2000 mg IV every 12 hours. Monitor labs, c/s, and vanco levels as indicated.  Height: 5\' 2"  (157.5 cm) Weight: 107.5 kg (237 lb) IBW/kg (Calculated) : 50.1  Temp (24hrs), Avg:101.8 F (38.8 C), Min:101.5 F (38.6 C), Max:102.1 F (38.9 C)  Recent Labs  Lab 07/01/23 1306 07/01/23 1314  WBC 14.6*  --   CREATININE 1.40*  --   LATICACIDVEN  --  2.3*    Estimated Creatinine Clearance: 40.1 mL/min (A) (by C-G formula based on SCr of 1.4 mg/dL (H)).    Allergies  Allergen Reactions   Latex Other (See Comments)    "my hands feels numb"    Antimicrobials this admission: Vanco 11/16 >> Cefepime 11/16 >>   Microbiology results: pending  Thank you for allowing pharmacy to be a part of this patient's care.  Tad Moore 07/01/2023 3:06 PM

## 2023-07-01 NOTE — Progress Notes (Signed)
Pt arrived to 330 via stretcher from the ED. See assessment.

## 2023-07-01 NOTE — H&P (Signed)
History and Physical    Patient: Stacey Summers DOB: [birth year] DOA: 07/01/2023 DOS: the patient was seen and examined on 07/01/2023 PCP: [name], DO  Patient coming from: Home  Chief Complaint:  Chief Complaint  Patient presents with   Altered Mental Status   HPI: Stacey Summers is a 77 y.o. female with medical history significant of diabetes, diastolic heart failure, COPD, hypertension, Nash cirrhosis, obstructive sleep apnea.  Patient seen for fevers, shortness of breath, swelling that started at couple of days ago.  Her symptoms worsened this morning and she was demonstrating some confusion which now seems to be clearing.  Denies chest pain, abdominal pain, dysuria, hematuria, blood in the stool.  Review of Systems: As mentioned in the history of present illness. All other systems reviewed and are negative. Past Medical History:  Diagnosis Date   Allergy    seasonal   Anxiety    Arthritis    hands, back    B12 deficiency 03/31/2021   Cataract    surgery to remove   Chronic back pain    lower back    Cirrhosis of liver without ascites (HCC)    recent dx 07/11/18   Closed fracture of right tibial plateau 01/31/2018   Complex tear of medial meniscus of right knee 03/27/2018   COPD (chronic obstructive pulmonary disease) (HCC)    no inhaler   Diabetes mellitus without complication (HCC)    DJD (degenerative joint disease)    Fibromyalgia    Genetic testing 03/08/2017   Ms. Coxe underwent genetic counseling and testing for hereditary cancer syndromes on 02/21/2017. Her results were negative for mutations in all 83 genes analyzed by [facility]'s 83-gene Multi-Cancers Panel. 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   History of abnormal mammogram    History of arthroplasty of right knee 07/06/2018   Hyperlipidemia    Hypertension    Irregular heart beat    Neuromuscular disorder (HCC)    fibromyalgia   Sleep apnea    cpap some   Stress fracture of right tibia 03/27/2018   SVD (spontaneous vaginal delivery)    x 2   Tubular adenoma of colon 05/09/12   "innumerable number" of flat polyps   Past Surgical History:  Procedure Laterality Date   CARPAL TUNNEL RELEASE Left 10/20/14   [facility]   CATARACT EXTRACTION, BILATERAL     CESAREAN SECTION  1977   CHOLECYSTECTOMY  1990   COLONOSCOPY     IR RADIOLOGIST EVAL & MGMT  07/16/2019   KNEE ARTHROSCOPY WITH SUBCHONDROPLASTY Right 03/27/2018   Procedure: Right knee arthroscopic partial medial meniscus repair versus menisectomy with medial tibia subchondroplasty;  Surgeon: Yolonda Kida, MD;  Location: [facility] OR;  Service: Orthopedics;  Laterality: Right;  120 mins   LARYNX SURGERY     POLYP REMOVAL   MULTIPLE TOOTH EXTRACTIONS     POLYPECTOMY     vocal cord   TONSILLECTOMY  1965   TUBAL LIGATION     TUMOR REMOVAL     benign fatty tumors on abdomen   UPPER GASTROINTESTINAL ENDOSCOPY     Social History:  reports that she quit smoking about 53 years ago. Her smoking use included cigarettes. She started smoking about 56 years  ago. She has a 3 pack-year smoking history. She has never used smokeless tobacco. She reports that she does not drink alcohol and does not use drugs.  Allergies  Allergen Reactions   Latex Other (See Comments)    "my hands feels numb"    Family History  Problem Relation Age of Onset   Heart failure Mother        d.37   Heart disease Mother    Prostate cancer Father        d.89 diagnosed in late-60s   Colon cancer Father        diagnosed at 71   Leukemia Maternal Aunt    Heart failure Maternal Uncle        d.50   Stomach cancer Paternal Aunt        questionable   Colon cancer Paternal Uncle        in his 24s    Heart failure Maternal Grandmother        d.45   Kidney cancer Cousin        d.47s maternal first-cousin   Colon polyps Brother        polyposis. Underwent partial colectomy at age 25.   Spina bifida Brother        died at birth   Colon polyps Son 20   Heart attack Son 6   Other Other 16       multiple lipomas   Esophageal cancer Neg Hx    Rectal cancer Neg Hx    Breast cancer Neg Hx     Prior to Admission medications   Medication Sig Start Date End Date Taking? Authorizing Provider  albuterol (VENTOLIN HFA) 108 (90 Base) MCG/ACT inhaler Inhale 2 puffs into the lungs every 6 (six) hours as needed for wheezing or shortness of breath. 11/02/22   Mechele Claude, MD  ALPRAZolam Prudy Feeler) 0.25 MG tablet Take 1/2-1 tablet once daily as needed for severe anxiety/ panic attack. May cause hallucinations/ falls 11/25/22   Delynn Flavin M, DO  augmented betamethasone dipropionate (DIPROLENE-AF) 0.05 % cream Apply topically. 03/10/22   [provider]  benazepril (LOTENSIN) 10 MG tablet Take 1 tablet (10 mg total) by mouth daily. 03/06/23   Croitoru, Mihai, MD  Biotin 5000 MCG SUBL Place under the tongue.    [provider]  blood glucose meter kit and supplies KIT Dispense based on patient and insurance preference. Use up to four times daily as directed. (FOR ICD-9 250.00, 250.01). 07/04/19   Mechele Claude, MD  Blood Glucose Monitoring Suppl (ONETOUCH VERIO FLEX SYSTEM) w/Device KIT Check Bs up to 4 times daily Dx E11.9, 06/04/21   Delynn Flavin M, DO  Cholecalciferol (VITAMIN D3) 5000 units CAPS Take 5,000 Units by mouth daily.    [provider]  cyclobenzaprine (FLEXERIL) 5 MG tablet TAKE 1 TABLET BY MOUTH THREE TIMES DAILY AS NEEDED FOR MUSCLE SPASM . APPOINTMENT REQUIRED FOR FUTURE REFILLS 06/26/23   Sonny Masters, FNP  diltiazem (CARDIZEM CD) 120 MG 24 hr capsule Take 1 capsule by mouth once daily 05/10/23   Croitoru, Mihai, MD  fluticasone (FLONASE) 50  MCG/ACT nasal spray Place 2 sprays into both nostrils daily. 05/06/22   Daryll Drown, NP  furosemide (LASIX) 40 MG tablet take 80 mg in the morning and 40 mg in the late afternoon 06/12/23   Croitoru, Mihai, MD  glucose blood (ONETOUCH VERIO) test strip Check Bs up to 4 times daily Dx E11.9, 02/01/23   [name],  DO  lactulose (CHRONULAC) 10 GM/15ML solution TAKE 15 ML BY MOUTH  THREE TIMES DAILY AS NEEDED 05/15/23   Napoleon Form, MD  meclizine (ANTIVERT) 25 MG tablet TAKE 1 TABLET BY MOUTH THREE TIMES DAILY AS NEEDED FOR DIZZINESS 05/23/22   Delynn Flavin M, DO  ondansetron (ZOFRAN-ODT) 4 MG disintegrating tablet DISSOLVE 1 TABLET IN MOUTH EVERY 6 HOURS AS NEEDED FOR NAUSEA AND VOMITING 12/19/22   Gottschalk, Ashly M, DO  ONETOUCH DELICA LANCETS 33G MISC USE ONE LANCET TO CHECK GLUCOSE ONCE DAILY AND AS NEEDED 10/10/17   Daphine Deutscher, Mary-Margaret, FNP  rifAXIMin (XIFAXAN PO) Take 500 mg by mouth 2 (two) times daily.    [provider]  spironolactone (ALDACTONE) 50 MG tablet Take 1 tablet (50 mg total) by mouth daily. 04/19/23   Napoleon Form, MD  traMADol (ULTRAM) 50 MG tablet Take 1 tablet (50 mg total) by mouth every 12 (twelve) hours as needed for severe pain. 11/25/22   [name], DO  XIFAXAN 550 MG TABS tablet TAKE ONE TABLET BY MOUTH TWICE A DAY 09/05/22   Napoleon Form, MD    Physical Exam: Vitals:   07/01/23 1300 07/01/23 1306 07/01/23 1400 07/01/23 1430  BP: (!) 157/59  (!) 140/55 (!) 127/59  Pulse: 83 78 77 82  Resp: (!) 24 (!) 23 (!) 21 (!) 21  Temp: (!) 102.1 F (38.9 C) (!) 101.5 F (38.6 C)    TempSrc: Rectal Oral    SpO2: 93% 94% 93% 93%  Weight:      Height:       General: Elderly female. Awake and alert and oriented x3. No acute cardiopulmonary distress.  HEENT: Normocephalic atraumatic.  Right and left ears normal in appearance.  Pupils equal, round, reactive to light. Extraocular muscles are intact. Sclerae anicteric and  noninjected.  Moist mucosal membranes. No mucosal lesions.  Neck: Neck supple without lymphadenopathy. No carotid bruits. No masses palpated.  Cardiovascular: Regular rate with normal S1-S2 sounds. No murmurs, rubs, gallops auscultated. No JVD.  Respiratory: Good respiratory effort with no wheezes, rales, rhonchi. Lungs clear to auscultation bilaterally.  No accessory muscle use. Abdomen: Soft, nontender, nondistended. Active bowel sounds. No masses or hepatosplenomegaly  Skin: No rashes, lesions, or ulcerations.  Dry, warm to touch. 2+ dorsalis pedis and radial pulses. Musculoskeletal: No calf or leg pain. All major joints not erythematous nontender.  No upper or lower joint deformation.  Good ROM.  No contractures  Psychiatric: Intact judgment and insight. Pleasant and cooperative. Neurologic: No focal neurological deficits. Strength is 5/5 and symmetric in upper and lower extremities.  Cranial nerves II through XII are grossly intact.  Data Reviewed: Results for orders placed or performed during the hospital encounter of 07/01/23 (from the past 24 hour(s))  Comprehensive metabolic panel     Status: Abnormal   Collection Time: 07/01/23  1:06 PM  Result Value Ref Range   Sodium 140 135 - 145 mmol/L   Potassium 4.2 3.5 - 5.1 mmol/L   Chloride 106 98 - 111 mmol/L   CO2 23 22 - 32 mmol/L   Glucose, Bld 164 (H) 70 - 99 mg/dL   BUN 38 (H) 8 - 23 mg/dL   Creatinine, Ser 1.61 (H) 0.44 - 1.00 mg/dL   Calcium 8.6 (L) 8.9 - 10.3 mg/dL   Total Protein 5.7 (L) 6.5 - 8.1 g/dL   Albumin 2.8 (L) 3.5 - 5.0 g/dL   AST 43 (H) 15 - 41 U/L   ALT  22 0 - 44 U/L   Alkaline Phosphatase 65 38 - 126 U/L   Total Bilirubin 5.7 (H) <1.2 mg/dL   GFR, Estimated 39 (L) >60 mL/min   Anion gap 11 5 - 15  CBC WITH DIFFERENTIAL     Status: Abnormal   Collection Time: 07/01/23  1:06 PM  Result Value Ref Range   WBC 14.6 (H) 4.0 - 10.5 K/uL   RBC 3.95 3.87 - 5.11 MIL/uL   Hemoglobin 13.5 12.0 - 15.0 g/dL   HCT 16.1  09.6 - 04.5 %   MCV 103.0 (H) 80.0 - 100.0 fL   MCH 34.2 (H) 26.0 - 34.0 pg   MCHC 33.2 30.0 - 36.0 g/dL   RDW 40.9 81.1 - 91.4 %   Platelets 59 (L) 150 - 400 K/uL   nRBC 0.0 0.0 - 0.2 %   Neutrophils Relative % 85 %   Neutro Abs 12.5 (H) 1.7 - 7.7 K/uL   Lymphocytes Relative 7 %   Lymphs Abs 1.0 0.7 - 4.0 K/uL   Monocytes Relative 7 %   Monocytes Absolute 1.1 (H) 0.1 - 1.0 K/uL   Eosinophils Relative 0 %   Eosinophils Absolute 0.0 0.0 - 0.5 K/uL   Basophils Relative 0 %   Basophils Absolute 0.0 0.0 - 0.1 K/uL   WBC Morphology MORPHOLOGY UNREMARKABLE    RBC Morphology MORPHOLOGY UNREMARKABLE    Smear Review PLATELET COUNT CONFIRMED BY SMEAR    Immature Granulocytes 1 %   Abs Immature Granulocytes 0.09 (H) 0.00 - 0.07 K/uL  Ammonia     Status: None   Collection Time: 07/01/23  1:14 PM  Result Value Ref Range   Ammonia 14 9 - 35 umol/L  Lactic acid, plasma     Status: Abnormal   Collection Time: 07/01/23  1:14 PM  Result Value Ref Range   Lactic Acid, Venous 2.3 (HH) 0.5 - 1.9 mmol/L  Brain natriuretic peptide     Status: Abnormal   Collection Time: 07/01/23  1:14 PM  Result Value Ref Range   B Natriuretic Peptide 901.0 (H) 0.0 - 100.0 pg/mL  Urinalysis, Routine w reflex microscopic -Urine, Catheterized     Status: Abnormal   Collection Time: 07/01/23  1:54 PM  Result Value Ref Range   Color, Urine AMBER (A) YELLOW   APPearance CLEAR CLEAR   Specific Gravity, Urine 1.021 1.005 - 1.030   pH 5.0 5.0 - 8.0   Glucose, UA NEGATIVE NEGATIVE mg/dL   Hgb urine dipstick SMALL (A) NEGATIVE   Bilirubin Urine NEGATIVE NEGATIVE   Ketones, ur NEGATIVE NEGATIVE mg/dL   Protein, ur 782 (A) NEGATIVE mg/dL   Nitrite NEGATIVE NEGATIVE   Leukocytes,Ua NEGATIVE NEGATIVE   RBC / HPF 0-5 0 - 5 RBC/hpf   WBC, UA 0-5 0 - 5 WBC/hpf   Bacteria, UA NONE SEEN NONE SEEN   Squamous Epithelial / HPF 0-5 0 - 5 /HPF   Mucus PRESENT    Hyaline Casts, UA PRESENT   Lactic acid, plasma     Status:  Abnormal   Collection Time: 07/01/23  3:36 PM  Result Value Ref Range   Lactic Acid, Venous 2.1 (HH) 0.5 - 1.9 mmol/L    CT HEAD WO CONTRAST  Result Date: 07/01/2023 CLINICAL DATA:  Mental status change, unknown cause EXAM: CT HEAD WITHOUT CONTRAST TECHNIQUE: Contiguous axial images were obtained from the base of the skull through the vertex without intravenous contrast. RADIATION DOSE REDUCTION: This exam was performed according to the  departmental dose-optimization program which includes automated exposure control, adjustment of the mA and/or kV according to patient size and/or use of iterative reconstruction technique. COMPARISON:  None Available. FINDINGS: Brain: No evidence of acute infarction, hemorrhage, hydrocephalus, extra-axial collection or mass lesion/mass effect. Patchy white matter hypodensities, nonspecific but compatible with chronic microvascular ischemic disease. Vascular: No hyperdense vessel. Skull: No acute fracture. Sinuses/Orbits: Clear sinuses.  No acute orbital findings. Other: No mastoid effusions. IMPRESSION: No evidence of acute intracranial abnormality. Electronically Signed   By: Feliberto Harts M.D.   On: 07/01/2023 14:35     Assessment and Plan: No notes have been filed under this hospital service. Service: Hospitalist  Principal Problem:   Sepsis (HCC) Active Problems:   Hypertension associated with diabetes (HCC)   Diabetes (HCC)   Morbid obesity (HCC)   COPD (chronic obstructive pulmonary disease) (HCC)   OSA treated with BiPAP   Liver cirrhosis secondary to NASH (HCC)   Acute on chronic diastolic CHF (congestive heart failure) (HCC)  Severe sepsis Unknown primary at the moment. Blood cultures pending Broad-spectrum antibiotics Appears bacterial in nature due to elevated white count and elevated ANC Will check procalcitonin. Repeat lactic acid No IV fluid bolus as patient is hemodynamically stable and has fluid overload state with anasarca We  will reconsider if lactic acid is increasing. Acute on chronic diastolic heart failure Telemetry monitoring Strict I/O Daily Weights Diuresis: Lasix and spironolactone Potassium: Will hold as patient on spironolactone Echo cardiac exam tomorrow Repeat CMP tomorrow Diabetes Not on long-term medications Sliding scale insulin Obstructive sleep apnea CPAP at night Liver cirrhosis secondary to NASH Bilirubin elevated.  Ammonia level normal. Will recheck bilirubin tomorrow Continue lactulose Hypertension.   Advance Care Planning:   Code Status: Full Code confirmed by patient and family  Consults: None  Family Communication: Family present during interview and exam  Severity of Illness: The appropriate patient status for this patient is INPATIENT. Inpatient status is judged to be reasonable and necessary in order to provide the required intensity of service to ensure the patient's safety. The patient's presenting symptoms, physical exam findings, and initial radiographic and laboratory data in the context of their chronic comorbidities is felt to place them at high risk for further clinical deterioration. Furthermore, it is not anticipated that the patient will be medically stable for discharge from the hospital within 2 midnights of admission.   * I certify that at the point of admission it is my clinical judgment that the patient will require inpatient hospital care spanning beyond 2 midnights from the point of admission due to high intensity of service, high risk for further deterioration and high frequency of surveillance required.*  Author: Levie Heritage, DO 07/01/2023 3:56 PM  For on call review [email or web address].

## 2023-07-01 NOTE — ED Notes (Signed)
Patient transported to CT 

## 2023-07-01 NOTE — Progress Notes (Signed)
Pt c/o sore throat while eating dinner. Pt family member stated another family member came to visit pt earlier in the week with their daughter who they later found out had strep throat. Pt family asking about getting strep test done. Candelaria Celeste, DO notified. See new order.

## 2023-07-01 NOTE — Sepsis Progress Note (Signed)
Elink following code sepsis  1505- MD and bedside RN messaged to make aware Blood cultures are not ordered.

## 2023-07-01 NOTE — Plan of Care (Signed)
  Problem: Pain Management: Goal: General experience of comfort will improve Outcome: Progressing   Problem: Safety: Goal: Ability to remain free from injury will improve Outcome: Progressing   Problem: Skin Integrity: Goal: Risk for impaired skin integrity will decrease Outcome: Progressing

## 2023-07-01 NOTE — ED Provider Notes (Signed)
[place] EMERGENCY DEPARTMENT AT [facility] Provider Note   CSN: [id number] Arrival date & time: 07/01/23  1228     History  Chief Complaint  Patient presents with   Altered Mental Status    Stacey Summers is a 77 y.o. female.  HPI Patient presents from home via EMS with concern for facial edema, fever, confusion.  Patient noted to have cirrhosis, family notes only 1 recent medication change, calcium supplement.  No clear onset offered.  Patient does not answer questions appropriately, confabulating some details, but there is no evidence for airway compromise.  EMS reports vital signs were unremarkable en route, though the patient was confused for them as well. Family arrives after EMS transport, corroborates history as above.    Home Medications Prior to Admission medications   Medication Sig Start Date End Date Taking? Authorizing Provider  albuterol (VENTOLIN HFA) 108 (90 Base) MCG/ACT inhaler Inhale 2 puffs into the lungs every 6 (six) hours as needed for wheezing or shortness of breath. 11/02/22   Mechele Claude, MD  ALPRAZolam Prudy Feeler) 0.25 MG tablet Take 1/2-1 tablet once daily as needed for severe anxiety/ panic attack. May cause hallucinations/ falls 11/25/22   Delynn Flavin M, DO  augmented betamethasone dipropionate (DIPROLENE-AF) 0.05 % cream Apply topically. 03/10/22   [provider]  benazepril (LOTENSIN) 10 MG tablet Take 1 tablet (10 mg total) by mouth daily. 03/06/23   Croitoru, Mihai, MD  Biotin 5000 MCG SUBL Place under the tongue.    [provider]  blood glucose meter kit and supplies KIT Dispense based on patient and insurance preference. Use up to four times daily as directed. (FOR ICD-9 250.00, 250.01). 07/04/19   Mechele Claude, MD  Blood Glucose Monitoring Suppl (ONETOUCH VERIO FLEX SYSTEM) w/Device KIT Check Bs up to 4 times daily Dx E11.9, 06/04/21   Delynn Flavin M, DO  Cholecalciferol (VITAMIN D3) 5000 units CAPS  Take 5,000 Units by mouth daily.    [provider]  cyclobenzaprine (FLEXERIL) 5 MG tablet TAKE 1 TABLET BY MOUTH THREE TIMES DAILY AS NEEDED FOR MUSCLE SPASM . APPOINTMENT REQUIRED FOR FUTURE REFILLS 06/26/23   Sonny Masters, FNP  diltiazem (CARDIZEM CD) 120 MG 24 hr capsule Take 1 capsule by mouth once daily 05/10/23   Croitoru, Mihai, MD  fluticasone (FLONASE) 50 MCG/ACT nasal spray Place 2 sprays into both nostrils daily. 05/06/22   Daryll Drown, NP  furosemide (LASIX) 40 MG tablet take 80 mg in the morning and 40 mg in the late afternoon 06/12/23   Croitoru, Mihai, MD  glucose blood (ONETOUCH VERIO) test strip Check Bs up to 4 times daily Dx E11.9, 02/01/23   Delynn Flavin M, DO  lactulose (CHRONULAC) 10 GM/15ML solution TAKE 15 ML BY MOUTH  THREE TIMES DAILY AS NEEDED 05/15/23   Napoleon Form, MD  meclizine (ANTIVERT) 25 MG tablet TAKE 1 TABLET BY MOUTH THREE TIMES DAILY AS NEEDED FOR DIZZINESS 05/23/22   Delynn Flavin M, DO  ondansetron (ZOFRAN-ODT) 4 MG disintegrating tablet DISSOLVE 1 TABLET IN MOUTH EVERY 6 HOURS AS NEEDED FOR NAUSEA AND VOMITING 12/19/22   Gottschalk, Ashly M, DO  ONETOUCH DELICA LANCETS 33G MISC USE ONE LANCET TO CHECK GLUCOSE ONCE DAILY AND AS NEEDED 10/10/17   Daphine Deutscher, Mary-Margaret, FNP  rifAXIMin (XIFAXAN PO) Take 500 mg by mouth 2 (two) times daily.    [provider]  spironolactone (ALDACTONE) 50 MG tablet Take 1 tablet (50 mg total) by mouth daily.  04/19/23   Napoleon Form, MD  traMADol (ULTRAM) 50 MG tablet Take 1 tablet (50 mg total) by mouth every 12 (twelve) hours as needed for severe pain. 11/25/22   Gottschalk, Ashly M, DO  XIFAXAN 550 MG TABS tablet TAKE ONE TABLET BY MOUTH TWICE A DAY 09/05/22   Napoleon Form, MD      Allergies    Latex    Review of Systems   Review of Systems  Physical Exam Updated Vital Signs BP (!) 127/59   Pulse 82   Temp (!) 101.5 F (38.6 C) (Oral)   Resp (!) 21   Ht 5\' 2"  (1.575 m)    Wt 107.5 kg   SpO2 93%   BMI 43.35 kg/m  Physical Exam Vitals and nursing note reviewed.  Constitutional:      General: She is not in acute distress.    Appearance: She is well-developed. She is obese.  HENT:     Head:   Eyes:     Conjunctiva/sclera: Conjunctivae normal.  Cardiovascular:     Rate and Rhythm: Normal rate and regular rhythm.  Pulmonary:     Effort: Pulmonary effort is normal. No respiratory distress.     Breath sounds: Normal breath sounds. No stridor.  Abdominal:     General: There is no distension.     Tenderness: There is no abdominal tenderness. There is no guarding.  Skin:    General: Skin is warm and dry.  Neurological:     Mental Status: She is alert.     Cranial Nerves: No cranial nerve deficit.     Motor: Atrophy present. No tremor.  Psychiatric:        Behavior: Behavior is withdrawn.     Comments: Patient withdrawn, but answering questions when directed at her.  Patient oriented to self, not to time, not to place though she does think she is in a hospital, it is a different facility.     ED Results / Procedures / Treatments   Labs (all labs ordered are listed, but only abnormal results are displayed) Labs Reviewed  COMPREHENSIVE METABOLIC PANEL - Abnormal; Notable for the following components:      Result Value   Glucose, Bld 164 (*)    BUN 38 (*)    Creatinine, Ser 1.40 (*)    Calcium 8.6 (*)    Total Protein 5.7 (*)    Albumin 2.8 (*)    AST 43 (*)    Total Bilirubin 5.7 (*)    GFR, Estimated 39 (*)    All other components within normal limits  CBC WITH DIFFERENTIAL/PLATELET - Abnormal; Notable for the following components:   WBC 14.6 (*)    MCV 103.0 (*)    MCH 34.2 (*)    Platelets 59 (*)    Neutro Abs 12.5 (*)    Monocytes Absolute 1.1 (*)    Abs Immature Granulocytes 0.09 (*)    All other components within normal limits  URINALYSIS, ROUTINE W REFLEX MICROSCOPIC - Abnormal; Notable for the following components:   Color,  Urine AMBER (*)    Hgb urine dipstick SMALL (*)    Protein, ur 100 (*)    All other components within normal limits  LACTIC ACID, PLASMA - Abnormal; Notable for the following components:   Lactic Acid, Venous 2.3 (*)    All other components within normal limits  BRAIN NATRIURETIC PEPTIDE - Abnormal; Notable for the following components:   B Natriuretic Peptide 901.0 (*)  All other components within normal limits  RESP PANEL BY RT-PCR (RSV, FLU A&B, COVID)  [id number]  CULTURE, BLOOD (ROUTINE X 2)  CULTURE, BLOOD (ROUTINE X 2)  AMMONIA    EKG EKG Interpretation Date/Time:  Saturday July 01 2023 12:55:56 EST Ventricular Rate:  80 PR Interval:  206 QRS Duration:  84 QT Interval:  369 QTC Calculation: 426 R Axis:   45  Text Interpretation: Sinus rhythm Low voltage, precordial leads Anterior injury pattern Confirmed by Gerhard Munch [phone]) on 07/01/2023 1:10:50 PM  Radiology CT HEAD WO CONTRAST  Result Date: 07/01/2023 CLINICAL DATA:  Mental status change, unknown cause EXAM: CT HEAD WITHOUT CONTRAST TECHNIQUE: Contiguous axial images were obtained from the base of the skull through the vertex without intravenous contrast. RADIATION DOSE REDUCTION: This exam was performed according to the departmental dose-optimization program which includes automated exposure control, adjustment of the mA and/or kV according to patient size and/or use of iterative reconstruction technique. COMPARISON:  None Available. FINDINGS: Brain: No evidence of acute infarction, hemorrhage, hydrocephalus, extra-axial collection or mass lesion/mass effect. Patchy white matter hypodensities, nonspecific but compatible with chronic microvascular ischemic disease. Vascular: No hyperdense vessel. Skull: No acute fracture. Sinuses/Orbits: Clear sinuses.  No acute orbital findings. Other: No mastoid effusions. IMPRESSION: No evidence of acute intracranial abnormality. Electronically Signed   By: Feliberto Harts M.D.    On: 07/01/2023 14:35    Procedures Procedures    Medications Ordered in ED Medications  furosemide (LASIX) injection 40 mg (has no administration in time range)  ceFEPIme (MAXIPIME) 2 g in sodium chloride 0.9 % 100 mL IVPB (has no administration in time range)  metroNIDAZOLE (FLAGYL) IVPB 500 mg (has no administration in time range)  vancomycin (VANCOCIN) IVPB 1000 mg/200 mL premix (has no administration in time range)  ceFEPIme (MAXIPIME) 2 g in sodium chloride 0.9 % 100 mL IVPB (has no administration in time range)  vancomycin (VANCOREADY) IVPB 1500 mg/300 mL (has no administration in time range)  acetaminophen (TYLENOL) tablet 1,000 mg (1,000 mg Oral Given 07/01/23 1310)    ED Course/ Medical Decision Making/ A&P                                 Medical Decision Making Multiple medical problems including cirrhosis, COPD, obesity, diabetes presents with altered mental status, was found to be febrile.  Concern for bacteremia, sepsis, versus medication reaction versus cellulitis with facial erythema.  Patient on Tylenol, labs, monitoring. Cardiac 90 sinus, pulse ox 97% room air normal Initial temp 101.5.  Amount and/or Complexity of Data Reviewed Independent Historian: EMS External Data Reviewed: notes. Labs: ordered. Decision-making details documented in ED Course. Radiology: ordered and independent interpretation performed. Decision-making details documented in ED Course.  Risk OTC drugs. Prescription drug management.   3:09 PM On repeat exam patient's family members not present.  Initial labs back, reviewed, notable for lactic acidosis, leukocytosis.  With tachypnea, fever, patient meets sepsis criteria, though there is no obvious source.  Considerations include the patient's facial swelling, trace erythema and cellulitis, possible oropharyngeal infection, though the patient has no difficulty with speech, and there is no obvious abnormality on exam performed now.  Patient  started broad-spectrum antibiotics, designated as a code sepsis, blood culture obtained.  Patient has received Tylenol.  With concern for concurrent fluid retention likely multifactorial CHF and hepatic failure, patient will not receive additional fluid resuscitation currently, but will receive Lasix given her  tachypnea, elevated BNP, worsening hepatic function.  Given, combination of sepsis with anasarca, patient will require admission for further evaluation, monitoring, management.  Final Clinical Impression(s) / ED Diagnoses Final diagnoses:  Severe sepsis (HCC)  Anasarca  CRITICAL CARE Performed by: Gerhard Munch Total critical care time: 35 minutes Critical care time was exclusive of separately billable procedures and treating other patients. Critical care was necessary to treat or prevent imminent or life-threatening deterioration. Critical care was time spent personally by me on the following activities: development of treatment plan with patient and/or surrogate as well as nursing, discussions with consultants, evaluation of patient's response to treatment, examination of patient, obtaining history from patient or surrogate, ordering and performing treatments and interventions, ordering and review of laboratory studies, ordering and review of radiographic studies, pulse oximetry and re-evaluation of patient's condition.    Gerhard Munch, MD 07/01/23 [phone]

## 2023-07-02 ENCOUNTER — Inpatient Hospital Stay (HOSPITAL_COMMUNITY): Payer: PPO

## 2023-07-02 DIAGNOSIS — E1159 Type 2 diabetes mellitus with other circulatory complications: Secondary | ICD-10-CM | POA: Diagnosis not present

## 2023-07-02 DIAGNOSIS — K7460 Unspecified cirrhosis of liver: Secondary | ICD-10-CM

## 2023-07-02 DIAGNOSIS — G4733 Obstructive sleep apnea (adult) (pediatric): Secondary | ICD-10-CM

## 2023-07-02 DIAGNOSIS — I5033 Acute on chronic diastolic (congestive) heart failure: Secondary | ICD-10-CM

## 2023-07-02 DIAGNOSIS — K7581 Nonalcoholic steatohepatitis (NASH): Secondary | ICD-10-CM

## 2023-07-02 DIAGNOSIS — J438 Other emphysema: Secondary | ICD-10-CM | POA: Diagnosis not present

## 2023-07-02 LAB — CBC
HCT: 36.1 % (ref 36.0–46.0)
Hemoglobin: 11.9 g/dL — ABNORMAL LOW (ref 12.0–15.0)
MCH: 33.3 pg (ref 26.0–34.0)
MCHC: 33 g/dL (ref 30.0–36.0)
MCV: 101.1 fL — ABNORMAL HIGH (ref 80.0–100.0)
Platelets: 58 10*3/uL — ABNORMAL LOW (ref 150–400)
RBC: 3.57 MIL/uL — ABNORMAL LOW (ref 3.87–5.11)
RDW: 12.9 % (ref 11.5–15.5)
WBC: 11.9 10*3/uL — ABNORMAL HIGH (ref 4.0–10.5)
nRBC: 0 % (ref 0.0–0.2)

## 2023-07-02 LAB — ECHOCARDIOGRAM COMPLETE
AR max vel: 1.81 cm2
AV Area VTI: 1.72 cm2
AV Area mean vel: 1.75 cm2
AV Mean grad: 15 mm[Hg]
AV Peak grad: 24.9 mm[Hg]
Ao pk vel: 2.5 m/s
Area-P 1/2: 3.03 cm2
Calc EF: 52.4 %
Height: 62 in
MV VTI: 3.22 cm2
S' Lateral: 3.3 cm
Single Plane A2C EF: 55.8 %
Single Plane A4C EF: 49.8 %
Weight: 3594.38 [oz_av]

## 2023-07-02 LAB — COMPREHENSIVE METABOLIC PANEL
ALT: 20 U/L (ref 0–44)
AST: 37 U/L (ref 15–41)
Albumin: 2.5 g/dL — ABNORMAL LOW (ref 3.5–5.0)
Alkaline Phosphatase: 67 U/L (ref 38–126)
Anion gap: 10 (ref 5–15)
BUN: 53 mg/dL — ABNORMAL HIGH (ref 8–23)
CO2: 25 mmol/L (ref 22–32)
Calcium: 8.4 mg/dL — ABNORMAL LOW (ref 8.9–10.3)
Chloride: 102 mmol/L (ref 98–111)
Creatinine, Ser: 2.1 mg/dL — ABNORMAL HIGH (ref 0.44–1.00)
GFR, Estimated: 24 mL/min — ABNORMAL LOW (ref >60)
Glucose, Bld: 121 mg/dL — ABNORMAL HIGH (ref 70–99)
Potassium: 4.1 mmol/L (ref 3.5–5.1)
Sodium: 137 mmol/L (ref 135–145)
Total Bilirubin: 4 mg/dL — ABNORMAL HIGH (ref <1.2)
Total Protein: 5.4 g/dL — ABNORMAL LOW (ref 6.5–8.1)

## 2023-07-02 LAB — GLUCOSE, CAPILLARY
Glucose-Capillary: 117 mg/dL — ABNORMAL HIGH (ref 70–99)
Glucose-Capillary: 147 mg/dL — ABNORMAL HIGH (ref 70–99)
Glucose-Capillary: 127 mg/dL — ABNORMAL HIGH (ref 70–99)
Glucose-Capillary: 120 mg/dL — ABNORMAL HIGH (ref 70–99)

## 2023-07-02 LAB — HEMOGLOBIN A1C
Hgb A1c MFr Bld: 5.1 % (ref 4.8–5.6)
Mean Plasma Glucose: 99.67 mg/dL

## 2023-07-02 LAB — PROCALCITONIN: Procalcitonin: 1.54 ng/mL

## 2023-07-02 MED ORDER — SPIRONOLACTONE 25 MG PO TABS
50.0000 mg | ORAL_TABLET | Freq: Every day | ORAL | Status: DC
  Filled 2023-07-02: qty 2

## 2023-07-02 MED ORDER — SODIUM CHLORIDE 0.9 % IV SOLN
2.0000 g | INTRAVENOUS | Status: DC
  Administered 2023-07-03: 2 g via INTRAVENOUS
  Filled 2023-07-02: qty 12.5

## 2023-07-02 NOTE — Plan of Care (Signed)
  Problem: Education: Goal: Knowledge of General Education information will improve Description: Including pain rating scale, medication(s)/side effects and non-pharmacologic comfort measures Outcome: Progressing   Problem: Elimination: Goal: Will not experience complications related to bowel motility Outcome: Not Progressing

## 2023-07-02 NOTE — Plan of Care (Signed)
  Problem: Education: Goal: Knowledge of General Education information will improve Description: Including pain rating scale, medication(s)/side effects and non-pharmacologic comfort measures Outcome: Progressing   Problem: Activity: Goal: Risk for activity intolerance will decrease Outcome: Progressing   Problem: Nutrition: Goal: Adequate nutrition will be maintained Outcome: Progressing   Problem: Pain Management: Goal: General experience of comfort will improve Outcome: Progressing

## 2023-07-02 NOTE — Progress Notes (Signed)
*  PRELIMINARY RESULTS* Echocardiogram 2D Echocardiogram has been performed.  Stacey Summers 07/02/2023, 12:44 PM

## 2023-07-02 NOTE — Progress Notes (Signed)
PROGRESS NOTE   Stacey Summers, is a 77 y.o. female, DOB - [birth year], ZOX:[id number]  Admit date - 07/01/2023   Admitting Physician Levie Heritage, DO  Outpatient Primary MD for the patient is Stacey Ip, DO  LOS - 1  Chief Complaint  Patient presents with   Altered Mental Status      Brief Narrative:  77 y.o. female with medical history significant of diabetes, diastolic heart failure, COPD, hypertension, Nash cirrhosis, obstructive sleep apnea admitted on 07/01/2023 with altered mentation fevers with concerns for underlying infection-source unknown.    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 -Creatinine up to 2.1 from 1.40 on admission--due to diuresis -Discontinue IV Lasix -Stop Aldactone - renally adjust medications, avoid nephrotoxic agents / dehydration  / hypotension  3) chronic diastolic dysfunction CHF -Echo from 07/02/2023 with EF of 55 to 60% with mild concentric LVH, No pulmonary hypertension, echo shows mild aortic stenosis, No mitral stenosis -- Patient developed AKI with IV Lasix as above #2 -Patient does not appear volume overloaded at this time -Hold diuretics  4)NASH liver cirrhosis--ammonia is not elevated -Elevated bilirubin noted---this is chronic for patient -AST and ALT as well as alk phos WNL -Continue lactulose and rifaximin  5)DM2- Use Novolog/Humalog Sliding scale insulin with Accu-Cheks/Fingersticks as ordered   6)Morbid Obesity/OSA--continue CPAP  7) chronic thrombocytopenia--the setting of underlying NASH liver disease -No bleeding concerns, monitor closely   Status is:  Inpatient   Disposition: The patient is from: Home              Anticipated d/c is to: Home              Anticipated d/c date is: 1 day              Patient currently is not medically stable to d/c. Barriers: Not Clinically Stable-   Code Status :  -  Code Status: Full Code   Family Communication:   (patient is alert, awake and coherent)  Discussed with daughter Stacey Summers at bedside  DVT Prophylaxis  :   - SCDs   SCDs Start: 07/01/23 1729   Lab Results  Component Value Date   PLT 58 (L) 07/02/2023    Inpatient Medications  Scheduled Meds:  diltiazem  120 mg Oral Daily   fluticasone  2 spray Each Nare Daily   insulin aspart  0-5 Units Subcutaneous QHS   insulin aspart  0-9 Units Subcutaneous TID WC   lactulose  20 g Oral TID   rifaximin  550 mg Oral BID   [START ON 07/03/2023] spironolactone  50 mg Oral Daily   Continuous Infusions:  [START ON 07/03/2023] ceFEPime (MAXIPIME) IV     [START ON 07/03/2023] vancomycin     PRN Meds:.acetaminophen **OR** acetaminophen, albuterol, ALPRAZolam, influenza vaccine adjuvanted, ondansetron **OR** ondansetron (ZOFRAN) IV, polyethylene glycol   Anti-infectives (From admission, onward)    Start     Dose/Rate Route Frequency Ordered Stop   07/03/23 1600  vancomycin (VANCOREADY) IVPB 1500 mg/300 mL        1,500 mg 150 mL/hr over 120 Minutes Intravenous Every 48 hours 07/01/23 1505     07/03/23 0600  ceFEPIme (MAXIPIME) 2 g in sodium chloride  0.9 % 100 mL IVPB        2 g 200 mL/hr over 30 Minutes Intravenous Every 24 hours 07/02/23 0809     07/02/23 0400  ceFEPIme (MAXIPIME) 2 g in sodium chloride 0.9 % 100 mL IVPB  Status:  Discontinued        2 g 200 mL/hr over 30 Minutes Intravenous Every 12 hours 07/01/23 1504 07/02/23 0809   07/01/23 2200  rifaximin (XIFAXAN) tablet 550 mg        550 mg Oral 2 times daily 07/01/23 1728     07/01/23 1515  vancomycin (VANCOCIN) IVPB 1000 mg/200 mL premix        1,000 mg 200 mL/hr over 60 Minutes  Intravenous Every 1 hr x 2 07/01/23 1503 07/01/23 1904   07/01/23 1500  ceFEPIme (MAXIPIME) 2 g in sodium chloride 0.9 % 100 mL IVPB        2 g 200 mL/hr over 30 Minutes Intravenous  Once 07/01/23 1456 07/01/23 1646   07/01/23 1500  metroNIDAZOLE (FLAGYL) IVPB 500 mg        500 mg 100 mL/hr over 60 Minutes Intravenous  Once 07/01/23 1456 07/01/23 1646   07/01/23 1500  vancomycin (VANCOCIN) IVPB 1000 mg/200 mL premix  Status:  Discontinued        1,000 mg 200 mL/hr over 60 Minutes Intravenous  Once 07/01/23 1456 07/01/23 1503         Subjective: Lupita Leash Sherod today has no further fevers, no emesis,  No chest pain,   - Voiding okay Daughter Stacey Summers at bedside  Objective: Vitals:   07/02/23 0016 07/02/23 0432 07/02/23 0947 07/02/23 1704  BP: 127/66 (!) 126/52  (!) 147/74  Pulse: 68 69  68  Resp:  16 19   Temp: 97.7 F (36.5 C) 97.7 F (36.5 C)    TempSrc: Oral Oral    SpO2: 97% 98%  99%  Weight:  101.9 kg    Height:        Intake/Output Summary (Last 24 hours) at 07/02/2023 1849 Last data filed at 07/02/2023 1838 Gross per 24 hour  Intake 1060.37 ml  Output --  Net 1060.37 ml   Filed Weights   07/01/23 1256 07/01/23 1728 07/02/23 0432  Weight: 107.5 kg 102 kg 101.9 kg    Physical Exam  Gen:- Awake Alert, in no apparent distress  HEENT:- [place].AT, No sclera icterus, periorbital area erythema and edema Ears--HOH Neck-Supple Neck,No JVD,.  Lungs-  CTAB , fair symmetrical air movement CV- S1, S2 normal, regular , 3/6 SM Abd-  +ve B.Sounds, Abd Soft, No tenderness,   no CVA tenderness Extremity/Skin:- No  edema, pedal pulses present  Psych-affect is appropriate, oriented x3 Neuro-no new focal deficits, no tremors  Data Reviewed: I have personally reviewed following labs and imaging studies  CBC: Recent Labs  Lab 07/01/23 1306 07/02/23 0455  WBC 14.6* 11.9*  NEUTROABS 12.5*  --   HGB 13.5 11.9*  HCT 40.7 36.1  MCV 103.0* 101.1*  PLT 59* 58*   Basic  Metabolic Panel: Recent Labs  Lab 07/01/23 1306 07/02/23 0455  NA 140 137  K 4.2 4.1  CL 106 102  CO2 23 25  GLUCOSE 164* 121*  BUN 38* 53*  CREATININE 1.40* 2.10*  CALCIUM 8.6* 8.4*   GFR: Estimated Creatinine Clearance: 25.9 mL/min (A) (by C-G formula based on SCr of 2.1 mg/dL (H)). Liver Function Tests: Recent Labs  Lab 07/01/23 1306 07/02/23 0455  AST 43* 37  ALT  22 20  ALKPHOS 65 67  BILITOT 5.7* 4.0*  PROT 5.7* 5.4*  ALBUMIN 2.8* 2.5*   HbA1C: Recent Labs    07/02/23 0455  HGBA1C 5.1   Recent Results (from the past 240 hour(s))  Culture, blood (routine x 2)     Status: None (Preliminary result)   Collection Time: 07/01/23  3:23 PM   Specimen: BLOOD RIGHT HAND  Result Value Ref Range Status   Specimen Description BLOOD RIGHT HAND  Final   Special Requests Blood Culture adequate volume  Final   Culture   Final    NO GROWTH < 24 HOURS Performed at [facility], [address] [place], [place] [address]    Report Status PENDING  Incomplete  Resp panel by RT-PCR (RSV, Flu A&B, Covid) Anterior Nasal Swab     Status: None   Collection Time: 07/01/23  3:25 PM   Specimen: Anterior Nasal Swab  Result Value Ref Range Status   SARS Coronavirus 2 by RT PCR NEGATIVE NEGATIVE Final    Comment: (NOTE) SARS-CoV-2 target nucleic acids are NOT DETECTED.  The SARS-CoV-2 RNA is generally detectable in upper respiratory specimens during the acute phase of infection. The lowest concentration of SARS-CoV-2 viral copies this assay can detect is 138 copies/mL. A negative result does not preclude SARS-Cov-2 infection and should not be used as the sole basis for treatment or other patient management decisions. A negative result may occur with  improper specimen collection/handling, submission of specimen other than nasopharyngeal swab, presence of viral mutation(s) within the areas targeted by this assay, and inadequate number of viral copies(<138 copies/mL). A negative  result must be combined with clinical observations, patient history, and epidemiological information. The expected result is Negative.  Fact Sheet for Patients:  BloggerCourse.com  Fact Sheet for Healthcare Providers:  SeriousBroker.it  This test is no t yet approved or cleared by the [place] FDA and  has been authorized for detection and/or diagnosis of SARS-CoV-2 by FDA under an Emergency Use Authorization (EUA). This EUA will remain  in effect (meaning this test can be used) for the duration of the COVID-19 declaration under Section 564(b)(1) of the Act, 21 U.S.C.section 360bbb-3(b)(1), unless the authorization is terminated  or revoked sooner.       Influenza A by PCR NEGATIVE NEGATIVE Final   Influenza B by PCR NEGATIVE NEGATIVE Final    Comment: (NOTE) The Xpert Xpress SARS-CoV-2/FLU/RSV plus assay is intended as an aid in the diagnosis of influenza from Nasopharyngeal swab specimens and should not be used as a sole basis for treatment. Nasal washings and aspirates are unacceptable for Xpert Xpress SARS-CoV-2/FLU/RSV testing.  Fact Sheet for Patients: BloggerCourse.com  Fact Sheet for Healthcare Providers: SeriousBroker.it  This test is not yet approved or cleared by the [place] FDA and has been authorized for detection and/or diagnosis of SARS-CoV-2 by FDA under an Emergency Use Authorization (EUA). This EUA will remain in effect (meaning this test can be used) for the duration of the COVID-19 declaration under Section 564(b)(1) of the Act, 21 U.S.C. section 360bbb-3(b)(1), unless the authorization is terminated or revoked.     Resp Syncytial Virus by PCR NEGATIVE NEGATIVE Final    Comment: (NOTE) Fact Sheet for Patients: BloggerCourse.com  Fact Sheet for Healthcare Providers: SeriousBroker.it  This  test is not yet approved or cleared by the [place] FDA and has been authorized for detection and/or diagnosis of SARS-CoV-2 by FDA under an Emergency Use Authorization (EUA). This EUA will remain  in effect (meaning this test can be used) for the duration of the COVID-19 declaration under Section 564(b)(1) of the Act, 21 U.S.C. section 360bbb-3(b)(1), unless the authorization is terminated or revoked.  Performed at [facility], [address], [place], [place] [address]   Culture, blood (routine x 2)     Status: None (Preliminary result)   Collection Time: 07/01/23  3:25 PM   Specimen: BLOOD LEFT ARM  Result Value Ref Range Status   Specimen Description BLOOD LEFT ARM  Final   Special Requests Blood Culture adequate volume  Final   Culture   Final    NO GROWTH < 24 HOURS Performed at [facility], [address] [place], [place] [address]    Report Status PENDING  Incomplete  MRSA Next Gen by PCR, Nasal     Status: None   Collection Time: 07/01/23  4:24 PM   Specimen: Nasal Mucosa; Nasal Swab  Result Value Ref Range Status   MRSA by PCR Next Gen NOT DETECTED NOT DETECTED Final    Comment: (NOTE) The GeneXpert MRSA Assay (FDA approved for NASAL specimens only), is one component of a comprehensive MRSA colonization surveillance program. It is not intended to diagnose MRSA infection nor to guide or monitor treatment for MRSA infections. Test performance is not FDA approved in patients less than 61 years old. Performed at [facility], [address] [place], [place] [address]   Group A Strep by PCR     Status: None   Collection Time: 07/01/23  4:24 PM   Specimen: Nasal Mucosa; Sterile Swab  Result Value Ref Range Status   Group A Strep by PCR NOT DETECTED NOT DETECTED Final    Comment: Performed at [facility], [address] [place], [place] [address]    Radiology Studies: ECHOCARDIOGRAM COMPLETE  Result Date: 07/02/2023    ECHOCARDIOGRAM REPORT   Patient Name:    SAVANNAH MEIR Date of Exam: 07/02/2023 Medical Rec #:  [id number]         Height:       62.0 in Accession #:    [id number]        Weight:       224.6 lb Date of Birth:  12-04-1945          BSA:          2.008 m Patient Age:    77 years          BP:           126/52 mmHg Patient Gender: F                 HR:           83 bpm. [facility] Procedure: 2D Echo, Cardiac Doppler and Color Doppler Indications:    CHF  History:        Patient has prior history of Echocardiogram examinations, most                 recent 01/18/2022. CHF, COPD, Signs/Symptoms:Dyspnea, Fatigue and                 Murmur; Risk Factors:Hypertension, Diabetes, Dyslipidemia and                 Sleep Apnea.  Sonographer:    Mikki Harbor Referring Phys: [phone] JACOB J STINSON  Sonographer Comments: Technically difficult study due to poor echo windows and patient is obese. IMPRESSIONS  1. Left ventricular ejection fraction, by estimation,  is 55 to 60%. The left ventricle has normal function. The left ventricle has no regional wall motion abnormalities. There is mild concentric left ventricular hypertrophy. Left ventricular diastolic parameters are indeterminate.Images are foreshortened.  2. Right ventricular systolic function is normal. The right ventricular size is normal. There is normal pulmonary artery systolic pressure.  3. Left atrial size was severely dilated.  4. The mitral valve is normal in structure. Trivial mitral valve regurgitation. No evidence of mitral stenosis.  5. The aortic valve is tricuspid. Aortic valve regurgitation is not visualized. Very mild aortic valve stenosis with peak velocity 2.5 m/s. Aortic valve mean gradient measures 15.0 mmHg. Comparison(s): No significant change from prior study. Prior images reviewed side by side. FINDINGS  Left Ventricle: Left ventricular ejection fraction, by estimation, is 55 to 60%. The left ventricle has normal function. The left ventricle has no regional wall motion  abnormalities. The left ventricular internal cavity size was normal in size. There is  mild concentric left ventricular hypertrophy. Left ventricular diastolic parameters are indeterminate. Right Ventricle: The right ventricular size is normal. No increase in right ventricular wall thickness. Right ventricular systolic function is normal. There is normal pulmonary artery systolic pressure. The tricuspid regurgitant velocity is 2.22 m/s, and  with an assumed right atrial pressure of 3 mmHg, the estimated right ventricular systolic pressure is 22.7 mmHg. Left Atrium: Left atrial size was severely dilated. Right Atrium: Right atrial size was normal in size. Pericardium: There is no evidence of pericardial effusion. Mitral Valve: The mitral valve is normal in structure. Trivial mitral valve regurgitation. No evidence of mitral valve stenosis. MV peak gradient, 4.8 mmHg. The mean mitral valve gradient is 2.0 mmHg. Tricuspid Valve: The tricuspid valve is normal in structure. Tricuspid valve regurgitation is not demonstrated. No evidence of tricuspid stenosis. Aortic Valve: The aortic valve is tricuspid. Aortic valve regurgitation is not visualized. Mild aortic stenosis is present. Aortic valve mean gradient measures 15.0 mmHg. Aortic valve peak gradient measures 24.9 mmHg. Aortic valve area, by VTI measures 1.72 cm. Pulmonic Valve: The pulmonic valve was normal in structure. Pulmonic valve regurgitation is trivial. No evidence of pulmonic stenosis. Aorta: The aortic root is normal in size and structure. IAS/Shunts: No atrial level shunt detected by color flow Doppler.  LEFT VENTRICLE PLAX 2D LVIDd:         4.70 cm     Diastology LVIDs:         3.30 cm     LV e' medial:    5.11 cm/s LV PW:         1.20 cm     LV E/e' medial:  23.1 LV IVS:        1.20 cm     LV e' lateral:   8.38 cm/s LVOT diam:     2.00 cm     LV E/e' lateral: 14.1 LV SV:         115 LV SV Index:   57 LVOT Area:     3.14 cm  LV Volumes (MOD) LV vol d,  MOD A2C: 64.0 ml LV vol d, MOD A4C: 73.5 ml LV vol s, MOD A2C: 28.3 ml LV vol s, MOD A4C: 36.9 ml LV SV MOD A2C:     35.7 ml LV SV MOD A4C:     73.5 ml LV SV MOD BP:      36.4 ml RIGHT VENTRICLE RV Basal diam:  3.40 cm RV Mid diam:    2.90 cm RV S prime:  15.70 cm/s TAPSE (M-mode): 2.9 cm LEFT ATRIUM              Index        RIGHT ATRIUM           Index LA diam:        4.40 cm  2.19 cm/m   RA Area:     19.00 cm LA Vol (A2C):   112.0 ml 55.76 ml/m  RA Volume:   53.30 ml  26.54 ml/m LA Vol (A4C):   99.2 ml  49.39 ml/m LA Biplane Vol: 112.0 ml 55.76 ml/m  AORTIC VALVE                     PULMONIC VALVE AV Area (Vmax):    1.81 cm      PV Vmax:       1.36 m/s AV Area (Vmean):   1.75 cm      PV Peak grad:  7.4 mmHg AV Area (VTI):     1.72 cm AV Vmax:           249.50 cm/s AV Vmean:          181.000 cm/s AV VTI:            0.671 m AV Peak Grad:      24.9 mmHg AV Mean Grad:      15.0 mmHg LVOT Vmax:         144.00 cm/s LVOT Vmean:        101.000 cm/s LVOT VTI:          0.367 m LVOT/AV VTI ratio: 0.55  AORTA Ao Root diam: 3.00 cm MITRAL VALVE                TRICUSPID VALVE MV Area (PHT): 3.03 cm     TR Peak grad:   19.7 mmHg MV Area VTI:   3.22 cm     TR Vmax:        222.00 cm/s MV Peak grad:  4.8 mmHg MV Mean grad:  2.0 mmHg     SHUNTS MV Vmax:       1.09 m/s     Systemic VTI:  0.37 m MV Vmean:      62.2 cm/s    Systemic Diam: 2.00 cm MV Decel Time: 250 msec MV E velocity: 118.00 cm/s MV A velocity: 103.00 cm/s MV E/A ratio:  1.15 Riley Lam MD Electronically signed by Riley Lam MD Signature Date/Time: 07/02/2023/1:05:54 PM    Final    DG Chest 2 View  Result Date: 07/01/2023 CLINICAL DATA:  Fever, confusion, altered mental status, and facial edema. EXAM: CHEST - 2 VIEW COMPARISON:  Chest radiographs 06/27/2018 and CT 07/28/2021 FINDINGS: The cardiomediastinal silhouette is within normal limits for AP technique. Aortic atherosclerosis is noted. Assessment of the lungs on the lateral  radiograph is limited by superimposition of the patient's arms. The lungs are hypoinflated. No confluent airspace opacity, overt pulmonary edema, definite pleural effusion, or pneumothorax is identified. No acute osseous abnormality is seen. IMPRESSION: No active cardiopulmonary disease. Electronically Signed   By: Sebastian Ache M.D.   On: 07/01/2023 16:19   CT HEAD WO CONTRAST  Result Date: 07/01/2023 CLINICAL DATA:  Mental status change, unknown cause EXAM: CT HEAD WITHOUT CONTRAST TECHNIQUE: Contiguous axial images were obtained from the base of the skull through the vertex without intravenous contrast. RADIATION DOSE REDUCTION: This exam was performed according to the departmental dose-optimization program which includes automated exposure  control, adjustment of the mA and/or kV according to patient size and/or use of iterative reconstruction technique. COMPARISON:  None Available. FINDINGS: Brain: No evidence of acute infarction, hemorrhage, hydrocephalus, extra-axial collection or mass lesion/mass effect. Patchy white matter hypodensities, nonspecific but compatible with chronic microvascular ischemic disease. Vascular: No hyperdense vessel. Skull: No acute fracture. Sinuses/Orbits: Clear sinuses.  No acute orbital findings. Other: No mastoid effusions. IMPRESSION: No evidence of acute intracranial abnormality. Electronically Signed   By: Feliberto Harts M.D.   On: 07/01/2023 14:35    Scheduled Meds:  diltiazem  120 mg Oral Daily   fluticasone  2 spray Each Nare Daily   insulin aspart  0-5 Units Subcutaneous QHS   insulin aspart  0-9 Units Subcutaneous TID WC   lactulose  20 g Oral TID   rifaximin  550 mg Oral BID   [START ON 07/03/2023] spironolactone  50 mg Oral Daily   Continuous Infusions:  [START ON 07/03/2023] ceFEPime (MAXIPIME) IV     [START ON 07/03/2023] vancomycin      LOS: 1 day   Shon Hale M.D on 07/02/2023 at 6:49 PM  Go to [email or web address] - for contact info  [facility] - Office  [phone]  If 7PM-7AM, please contact night-coverage [email or web address] 07/02/2023, 6:49 PM

## 2023-07-03 DIAGNOSIS — J438 Other emphysema: Secondary | ICD-10-CM | POA: Diagnosis not present

## 2023-07-03 DIAGNOSIS — D72829 Elevated white blood cell count, unspecified: Secondary | ICD-10-CM

## 2023-07-03 DIAGNOSIS — I152 Hypertension secondary to endocrine disorders: Secondary | ICD-10-CM

## 2023-07-03 DIAGNOSIS — E1159 Type 2 diabetes mellitus with other circulatory complications: Secondary | ICD-10-CM

## 2023-07-03 LAB — CBC
HCT: 35.5 % — ABNORMAL LOW (ref 36.0–46.0)
Hemoglobin: 12 g/dL (ref 12.0–15.0)
MCH: 34.2 pg — ABNORMAL HIGH (ref 26.0–34.0)
MCHC: 33.8 g/dL (ref 30.0–36.0)
MCV: 101.1 fL — ABNORMAL HIGH (ref 80.0–100.0)
Platelets: 69 10*3/uL — ABNORMAL LOW (ref 150–400)
RBC: 3.51 MIL/uL — ABNORMAL LOW (ref 3.87–5.11)
RDW: 13.2 % (ref 11.5–15.5)
WBC: 8.1 10*3/uL (ref 4.0–10.5)
nRBC: 0.2 % (ref 0.0–0.2)

## 2023-07-03 LAB — COMPREHENSIVE METABOLIC PANEL
ALT: 19 U/L (ref 0–44)
AST: 39 U/L (ref 15–41)
Albumin: 2.5 g/dL — ABNORMAL LOW (ref 3.5–5.0)
Alkaline Phosphatase: 68 U/L (ref 38–126)
Anion gap: 9 (ref 5–15)
BUN: 62 mg/dL — ABNORMAL HIGH (ref 8–23)
CO2: 25 mmol/L (ref 22–32)
Calcium: 8.4 mg/dL — ABNORMAL LOW (ref 8.9–10.3)
Chloride: 105 mmol/L (ref 98–111)
Creatinine, Ser: 1.77 mg/dL — ABNORMAL HIGH (ref 0.44–1.00)
GFR, Estimated: 30 mL/min — ABNORMAL LOW (ref >60)
Glucose, Bld: 108 mg/dL — ABNORMAL HIGH (ref 70–99)
Potassium: 4.1 mmol/L (ref 3.5–5.1)
Sodium: 139 mmol/L (ref 135–145)
Total Bilirubin: 2.3 mg/dL — ABNORMAL HIGH (ref <1.2)
Total Protein: 5.2 g/dL — ABNORMAL LOW (ref 6.5–8.1)

## 2023-07-03 LAB — GLUCOSE, CAPILLARY
Glucose-Capillary: 97 mg/dL (ref 70–99)
Glucose-Capillary: 179 mg/dL — ABNORMAL HIGH (ref 70–99)

## 2023-07-03 LAB — CREATININE, SERUM
Creatinine, Ser: 1.69 mg/dL — ABNORMAL HIGH (ref 0.44–1.00)
GFR, Estimated: 31 mL/min — ABNORMAL LOW (ref >60)

## 2023-07-03 MED ORDER — LACTULOSE 10 GM/15ML PO SOLN: ORAL | 1 refills | Status: DC

## 2023-07-03 MED ORDER — SODIUM CHLORIDE 0.9 % IV SOLN: 2.0000 g | Freq: Once | INTRAVENOUS | Status: DC

## 2023-07-03 MED ORDER — DILTIAZEM HCL ER COATED BEADS 120 MG PO CP24: 120.0000 mg | ORAL_CAPSULE | ORAL | 2 refills | Status: AC

## 2023-07-03 MED ORDER — FUROSEMIDE 40 MG PO TABS: 40.0000 mg | ORAL_TABLET | Freq: Two times a day (BID) | ORAL | 2 refills | Status: DC

## 2023-07-03 MED ORDER — ALBUTEROL SULFATE HFA 108 (90 BASE) MCG/ACT IN AERS: 2.0000 | INHALATION_SPRAY | Freq: Four times a day (QID) | RESPIRATORY_TRACT | 1 refills | Status: AC | PRN

## 2023-07-03 MED ORDER — HYDROCORTISONE 1 % EX CREA: 1.0000 | TOPICAL_CREAM | Freq: Two times a day (BID) | CUTANEOUS | 0 refills | Status: AC

## 2023-07-03 MED ORDER — SPIRONOLACTONE 50 MG PO TABS
25.0000 mg | ORAL_TABLET | Freq: Every day | ORAL | 1 refills | Status: AC
Start: 2023-07-03 — End: ?

## 2023-07-03 MED ORDER — TRAMADOL HCL 50 MG PO TABS
50.0000 mg | ORAL_TABLET | Freq: Once | ORAL | Status: AC
  Administered 2023-07-03: 50 mg via ORAL
  Filled 2023-07-03: qty 1

## 2023-07-03 MED ORDER — FLUTICASONE PROPIONATE 50 MCG/ACT NA SUSP: 2.0000 | Freq: Every day | NASAL | 6 refills | Status: DC

## 2023-07-03 MED ORDER — SODIUM CHLORIDE 0.9 % IV SOLN: 2.0000 g | Freq: Two times a day (BID) | INTRAVENOUS | Status: DC

## 2023-07-03 MED ORDER — XIFAXAN 550 MG PO TABS: 550.0000 mg | ORAL_TABLET | Freq: Two times a day (BID) | ORAL | 2 refills | Status: DC

## 2023-07-03 MED ORDER — SODIUM CHLORIDE 0.9 % IV SOLN
2.0000 g | Freq: Once | INTRAVENOUS | Status: AC
  Administered 2023-07-03: 2 g via INTRAVENOUS
  Filled 2023-07-03: qty 20

## 2023-07-03 MED ORDER — AMOXICILLIN-POT CLAVULANATE 875-125 MG PO TABS: 1.0000 | ORAL_TABLET | Freq: Two times a day (BID) | ORAL | 0 refills | Status: AC

## 2023-07-03 NOTE — Progress Notes (Signed)
Refused CPAP

## 2023-07-03 NOTE — Discharge Summary (Signed)
Stacey Summers, is a 77 y.o. female  DOB 1946-06-30  MRN [id number].  Admission date:  07/01/2023  Admitting Physician  Levie Heritage, DO  Discharge Date:  07/03/2023   Primary MD  [name], DO  Recommendations for primary care physician for things to follow:  1)Repeat CBC and CMP blood test within a week with PCP 2)Patient notes that some of your medications has been adjusted 3) take lactulose as prescribed--- goal is to have 2-3 soft/mushy BMs per day, avoid constipation  Admission Diagnosis  Anasarca [R60.1] Sepsis (HCC) [A41.9] Severe sepsis (HCC) [A41.9, R65.20]   Discharge Diagnosis  Anasarca [R60.1] Sepsis (HCC) [A41.9] Severe sepsis (HCC) [A41.9, R65.20]    Principal Problem:   Sepsis (HCC) Active Problems:   Hypertension associated with diabetes (HCC)   Diabetes (HCC)   Morbid obesity (HCC)   COPD (chronic obstructive pulmonary disease) (HCC)   OSA treated with BiPAP   Liver cirrhosis secondary to NASH (HCC)   Acute on chronic diastolic CHF (congestive heart failure) (HCC)      Past Medical History:  Diagnosis Date   Allergy    seasonal   Anxiety    Arthritis    hands, back    B12 deficiency 03/31/2021   Cataract    surgery to remove   Chronic back pain    lower back    Cirrhosis of liver without ascites (HCC)    recent dx 07/11/18   Closed fracture of right tibial plateau 01/31/2018   Complex tear of medial meniscus of right knee 03/27/2018   COPD (chronic obstructive pulmonary disease) (HCC)    no inhaler   Diabetes mellitus without complication (HCC)    DJD (degenerative joint disease)    Fibromyalgia    Genetic testing 03/08/2017   [name] underwent genetic counseling and testing for hereditary cancer syndromes on 02/21/2017. Her results were negative for mutations in all 83 genes analyzed by [facility]'s 83-gene Multi-Cancers Panel. Genes analyzed  include: ALK, APC, ATM, AXIN2, BAP1, BARD1, BLM, BMPR1A, BRCA1, BRCA2, BRIP1, CASR, CDC73, CDH1, CDK4, CDKN1B, CDKN1C, CDKN2A, CEBPA, CHEK2, CTNNA1, DICER1, DIS3L2, EGFR, EPCAM   GERD (gastroesophageal reflux disease)    Hearing loss    bilateral - no hearing aids   History of abnormal mammogram    History of arthroplasty of right knee 07/06/2018   Hyperlipidemia    Hypertension    Irregular heart beat    Neuromuscular disorder (HCC)    fibromyalgia   Sleep apnea    cpap some   Stress fracture of right tibia 03/27/2018   SVD (spontaneous vaginal delivery)    x 2   Tubular adenoma of colon 05/09/12   "innumerable number" of flat polyps    Past Surgical History:  Procedure Laterality Date   CARPAL TUNNEL RELEASE Left 10/20/14   [facility]   CATARACT EXTRACTION, BILATERAL     CESAREAN SECTION  1977   CHOLECYSTECTOMY  1990   COLONOSCOPY     IR RADIOLOGIST EVAL & MGMT  07/16/2019  KNEE ARTHROSCOPY WITH SUBCHONDROPLASTY Right 03/27/2018   Procedure: Right knee arthroscopic partial medial meniscus repair versus menisectomy with medial tibia subchondroplasty;  Surgeon: Yolonda Kida, MD;  Location: [facility] OR;  Service: Orthopedics;  Laterality: Right;  120 mins   LARYNX SURGERY     POLYP REMOVAL   MULTIPLE TOOTH EXTRACTIONS     POLYPECTOMY     vocal cord   TONSILLECTOMY  1965   TUBAL LIGATION     TUMOR REMOVAL     benign fatty tumors on abdomen   UPPER GASTROINTESTINAL ENDOSCOPY      HPI  from the history and physical done on the day of admission:   HPI: Stacey Summers is a 77 y.o. female with medical history significant of diabetes, diastolic heart failure, COPD, hypertension, Nash cirrhosis, obstructive sleep apnea.  Patient seen for fevers, shortness of breath, swelling that started at couple of days ago.  Her symptoms worsened this morning and she was demonstrating some confusion which now seems to be clearing.  Denies chest pain, abdominal pain, dysuria, hematuria, blood in  the stool.   Review of Systems: As mentioned in the history of present illness. All other systems reviewed and are negative.   Hospital Course:   Brief Narrative:  77 y.o. female with medical history significant of diabetes, diastolic heart failure, COPD, hypertension, Nash cirrhosis, obstructive sleep apnea admitted on 07/01/2023 with altered mentation fevers with concerns for underlying infection-source unknown.     -Assessment and Plan: 1)Upper Respiratory infection--- patient had fevers, sinus tenderness,, periorbital and facial area swelling and tenderness -WBC 14.6 >> 11.9>>8.1 PCT 1.13 >>1.54 -Chest x-ray without acute findings -CT head without acute findings specifically no acute orbital findings, no significant sinus findings -Patient was treated with cefepime and Vanco -Received Rocephin on 07/03/2023 -Leukocytosis has resolved -No further fevers -Altered mentation/acute metabolic encephalopathy has resolved -Blood cultures from 07/01/2023 NGTD  -Group A strep negative, MRSA PCR nasal swab negative -Okay to discharge on Augmentin -COVID, flu and RSV negative -Clinically much improved and back to baseline.  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 -Creatinine up to 2.1 from 1.40 on admission--due to diuresis -Patient received diuretic holiday--we had Lasix and Aldactone -Creatinine improved to 1.7 -Discharge on reduced dose of Lasix and Aldactone -Repeat CMP with PCP within a week with PCP - renally adjust medications, avoid nephrotoxic agents / dehydration  / hypotension   3) chronic diastolic dysfunction CHF--in the setting of underlying NASH liver cirrhosis -Echo from 07/02/2023 with EF of 55 to 60% with mild concentric LVH, No pulmonary hypertension, echo shows mild aortic stenosis, No mitral stenosis -- Patient developed AKI with IV Lasix as above #2 -Patient does not  appear volume overloaded at this time  4)NASH liver cirrhosis--ammonia is Not elevated -Elevated bilirubin noted---this is chronic for patient -AST and ALT as well as alk phos WNL -Continue lactulose and rifaximin -Prior to admission patient was taking Cardizem every other day--she had episode of bradycardia here after taking Cardizem on back-to-back days, prior to admission schedule and take Cardizem only on Mondays Wednesdays Fridays   5)DM2-A1c is 5.1--flat initially control PTA -Continue lifestyle and dietary modification   6)Morbid Obesity/OSA- - -Pt declines CPAP or OSA workup -Low calorie diet, portion control and increase physical activity discussed with patient -Body mass index is 42.54 kg/m.   7) chronic thrombocytopenia--the setting of underlying NASH liver disease -No bleeding concerns -  Repeat CBC within a week    Disposition: The patient is from: Home              Anticipated d/c is to: Home  Discharge Condition: stable  Follow UP   Follow-up Information     [name], DO. Schedule an appointment as soon as possible for a visit in 1 week(s).   Specialty: Family Medicine Why: - Repeat CBC and CMP blood test Contact information: [address] [place] [address] [phone]                  Diet and Activity recommendation:  As advised  Discharge Instructions    Discharge Instructions     Call MD for:  difficulty breathing, headache or visual disturbances   Complete by: As directed    Call MD for:  persistant dizziness or light-headedness   Complete by: As directed    Call MD for:  persistant nausea and vomiting   Complete by: As directed    Call MD for:  temperature >100.4   Complete by: As directed    Diet - low sodium heart healthy   Complete by: As directed    Discharge instructions   Complete by: As directed    1)Repeat CBC and CMP blood test within a week with PCP 2)Patient notes that some of your medications has been  adjusted 3) take lactulose as prescribed--- goal is to have 2-3 soft/mushy BMs per day, avoid constipation   Increase activity slowly   Complete by: As directed         Discharge Medications     Allergies as of 07/03/2023       Reactions   Latex Other (See Comments)   "my hands feels numb"        Medication List     TAKE these medications    albuterol 108 (90 Base) MCG/ACT inhaler Commonly known as: VENTOLIN HFA Inhale 2 puffs into the lungs every 6 (six) hours as needed for wheezing or shortness of breath.   ALPRAZolam 0.25 MG tablet Commonly known as: XANAX Take 1/2-1 tablet once daily as needed for severe anxiety/ panic attack. May cause hallucinations/ falls   amoxicillin-clavulanate 875-125 MG tablet Commonly known as: AUGMENTIN Take 1 tablet by mouth 2 (two) times daily for 5 days.   ascorbic acid 500 MG tablet Commonly known as: VITAMIN C Take 500 mg by mouth daily.   BEET ROOT PO Take 1 Capful by mouth daily.   Biotin 5000 MCG Subl Place under the tongue.   blood glucose meter kit and supplies Kit Dispense based on patient and insurance preference. Use up to four times daily as directed. (FOR ICD-9 250.00, 250.01).   cyclobenzaprine 5 MG tablet Commonly known as: FLEXERIL TAKE 1 TABLET BY MOUTH THREE TIMES DAILY AS NEEDED FOR MUSCLE SPASM . APPOINTMENT REQUIRED FOR FUTURE REFILLS   diltiazem 120 MG 24 hr capsule Commonly known as: CARDIZEM CD Take 1 capsule (120 mg total) by mouth every Monday, Wednesday, and Friday. What changed: when to take this   fluticasone 50 MCG/ACT nasal spray Commonly known as: FLONASE Place 2 sprays into both nostrils daily.   furosemide 40 MG tablet Commonly known as: LASIX Take 1 tablet (40 mg total) by mouth 2 (two) times daily. What changed:  how much to take how to take this when to take this additional instructions   hydrocortisone cream 1 % Apply 1 Application topically 2 (two) times daily. Apply a  sparingly (thin film) to facial rash twice daily for up to a week   lactulose 10 GM/15ML solution Commonly known as: CHRONULAC TAKE 15 ML BY MOUTH  THREE TIMES DAILY AS NEEDED What changed: See the new instructions.   meclizine 25 MG tablet Commonly known as: ANTIVERT TAKE 1 TABLET BY MOUTH THREE TIMES DAILY AS NEEDED FOR DIZZINESS   ondansetron 4 MG disintegrating tablet Commonly known as: ZOFRAN-ODT DISSOLVE 1 TABLET IN MOUTH EVERY 6 HOURS AS NEEDED FOR NAUSEA AND VOMITING   OneTouch Delica Lancets 33G Misc USE ONE LANCET TO CHECK GLUCOSE ONCE DAILY AND AS NEEDED   OneTouch Verio Flex System w/Device Kit Check Bs up to 4 times daily Dx E11.9,   OneTouch Verio test strip Generic drug: glucose blood Check Bs up to 4 times daily Dx E11.9,   spironolactone 50 MG tablet Commonly known as: Aldactone Take 0.5 tablets (25 mg total) by mouth daily.   Vitamin D3 125 MCG (5000 UT) Caps Take 5,000 Units by mouth daily.   Xifaxan 550 MG Tabs tablet Generic drug: rifaximin Take 1 tablet (550 mg total) by mouth 2 (two) times daily.       Major procedures and Radiology Reports - PLEASE review detailed and final reports for all details, in brief -   ECHOCARDIOGRAM COMPLETE  Result Date: 07/02/2023    ECHOCARDIOGRAM REPORT   Patient Name:   Stacey Summers Date of Exam: 07/02/2023 Medical Rec #:  [id number]         Height:       62.0 in Accession #:    [id number]        Weight:       224.6 lb Date of Birth:  1945-12-27          BSA:          2.008 m Patient Age:    77 years          BP:           126/52 mmHg Patient Gender: F                 HR:           83 bpm. [facility] Procedure: 2D Echo, Cardiac Doppler and Color Doppler Indications:    CHF  History:        Patient has prior history of Echocardiogram examinations, most                 recent 01/18/2022. CHF, COPD, Signs/Symptoms:Dyspnea, Fatigue and                 Murmur; Risk Factors:Hypertension, Diabetes,  Dyslipidemia and                 Sleep Apnea.  Sonographer:    Mikki Harbor Referring Phys: [phone] JACOB J STINSON  Sonographer Comments: Technically difficult study due to poor echo windows and patient is obese. IMPRESSIONS  1. Left ventricular ejection fraction, by estimation, is 55 to 60%. The left ventricle has normal function. The left ventricle has no regional wall motion abnormalities. There is mild concentric left ventricular hypertrophy. Left ventricular diastolic parameters are indeterminate.Images are foreshortened.  2. Right ventricular systolic function is normal. The right ventricular size is normal. There is normal pulmonary artery systolic pressure.  3. Left atrial size was severely dilated.  4. The mitral valve is normal in structure. Trivial mitral valve regurgitation. No evidence of mitral stenosis.  5. The aortic valve  is tricuspid. Aortic valve regurgitation is not visualized. Very mild aortic valve stenosis with peak velocity 2.5 m/s. Aortic valve mean gradient measures 15.0 mmHg. Comparison(s): No significant change from prior study. Prior images reviewed side by side. FINDINGS  Left Ventricle: Left ventricular ejection fraction, by estimation, is 55 to 60%. The left ventricle has normal function. The left ventricle has no regional wall motion abnormalities. The left ventricular internal cavity size was normal in size. There is  mild concentric left ventricular hypertrophy. Left ventricular diastolic parameters are indeterminate. Right Ventricle: The right ventricular size is normal. No increase in right ventricular wall thickness. Right ventricular systolic function is normal. There is normal pulmonary artery systolic pressure. The tricuspid regurgitant velocity is 2.22 m/s, and  with an assumed right atrial pressure of 3 mmHg, the estimated right ventricular systolic pressure is 22.7 mmHg. Left Atrium: Left atrial size was severely dilated. Right Atrium: Right atrial size was normal in  size. Pericardium: There is no evidence of pericardial effusion. Mitral Valve: The mitral valve is normal in structure. Trivial mitral valve regurgitation. No evidence of mitral valve stenosis. MV peak gradient, 4.8 mmHg. The mean mitral valve gradient is 2.0 mmHg. Tricuspid Valve: The tricuspid valve is normal in structure. Tricuspid valve regurgitation is not demonstrated. No evidence of tricuspid stenosis. Aortic Valve: The aortic valve is tricuspid. Aortic valve regurgitation is not visualized. Mild aortic stenosis is present. Aortic valve mean gradient measures 15.0 mmHg. Aortic valve peak gradient measures 24.9 mmHg. Aortic valve area, by VTI measures 1.72 cm. Pulmonic Valve: The pulmonic valve was normal in structure. Pulmonic valve regurgitation is trivial. No evidence of pulmonic stenosis. Aorta: The aortic root is normal in size and structure. IAS/Shunts: No atrial level shunt detected by color flow Doppler.  LEFT VENTRICLE PLAX 2D LVIDd:         4.70 cm     Diastology LVIDs:         3.30 cm     LV e' medial:    5.11 cm/s LV PW:         1.20 cm     LV E/e' medial:  23.1 LV IVS:        1.20 cm     LV e' lateral:   8.38 cm/s LVOT diam:     2.00 cm     LV E/e' lateral: 14.1 LV SV:         115 LV SV Index:   57 LVOT Area:     3.14 cm  LV Volumes (MOD) LV vol d, MOD A2C: 64.0 ml LV vol d, MOD A4C: 73.5 ml LV vol s, MOD A2C: 28.3 ml LV vol s, MOD A4C: 36.9 ml LV SV MOD A2C:     35.7 ml LV SV MOD A4C:     73.5 ml LV SV MOD BP:      36.4 ml RIGHT VENTRICLE RV Basal diam:  3.40 cm RV Mid diam:    2.90 cm RV S prime:     15.70 cm/s TAPSE (M-mode): 2.9 cm LEFT ATRIUM              Index        RIGHT ATRIUM           Index LA diam:        4.40 cm  2.19 cm/m   RA Area:     19.00 cm LA Vol (A2C):   112.0 ml 55.76 ml/m  RA Volume:   53.30 ml  26.54 ml/m LA Vol (A4C):   99.2 ml  49.39 ml/m LA Biplane Vol: 112.0 ml 55.76 ml/m  AORTIC VALVE                     PULMONIC VALVE AV Area (Vmax):    1.81 cm      PV Vmax:        1.36 m/s AV Area (Vmean):   1.75 cm      PV Peak grad:  7.4 mmHg AV Area (VTI):     1.72 cm AV Vmax:           249.50 cm/s AV Vmean:          181.000 cm/s AV VTI:            0.671 m AV Peak Grad:      24.9 mmHg AV Mean Grad:      15.0 mmHg LVOT Vmax:         144.00 cm/s LVOT Vmean:        101.000 cm/s LVOT VTI:          0.367 m LVOT/AV VTI ratio: 0.55  AORTA Ao Root diam: 3.00 cm MITRAL VALVE                TRICUSPID VALVE MV Area (PHT): 3.03 cm     TR Peak grad:   19.7 mmHg MV Area VTI:   3.22 cm     TR Vmax:        222.00 cm/s MV Peak grad:  4.8 mmHg MV Mean grad:  2.0 mmHg     SHUNTS MV Vmax:       1.09 m/s     Systemic VTI:  0.37 m MV Vmean:      62.2 cm/s    Systemic Diam: 2.00 cm MV Decel Time: 250 msec MV E velocity: 118.00 cm/s MV A velocity: 103.00 cm/s MV E/A ratio:  1.15 Riley Lam MD Electronically signed by Riley Lam MD Signature Date/Time: 07/02/2023/1:05:54 PM    Final    DG Chest 2 View  Result Date: 07/01/2023 CLINICAL DATA:  Fever, confusion, altered mental status, and facial edema. EXAM: CHEST - 2 VIEW COMPARISON:  Chest radiographs 06/27/2018 and CT 07/28/2021 FINDINGS: The cardiomediastinal silhouette is within normal limits for AP technique. Aortic atherosclerosis is noted. Assessment of the lungs on the lateral radiograph is limited by superimposition of the patient's arms. The lungs are hypoinflated. No confluent airspace opacity, overt pulmonary edema, definite pleural effusion, or pneumothorax is identified. No acute osseous abnormality is seen. IMPRESSION: No active cardiopulmonary disease. Electronically Signed   By: Sebastian Ache M.D.   On: 07/01/2023 16:19   CT HEAD WO CONTRAST  Result Date: 07/01/2023 CLINICAL DATA:  Mental status change, unknown cause EXAM: CT HEAD WITHOUT CONTRAST TECHNIQUE: Contiguous axial images were obtained from the base of the skull through the vertex without intravenous contrast. RADIATION DOSE REDUCTION: This exam was  performed according to the departmental dose-optimization program which includes automated exposure control, adjustment of the mA and/or kV according to patient size and/or use of iterative reconstruction technique. COMPARISON:  None Available. FINDINGS: Brain: No evidence of acute infarction, hemorrhage, hydrocephalus, extra-axial collection or mass lesion/mass effect. Patchy white matter hypodensities, nonspecific but compatible with chronic microvascular ischemic disease. Vascular: No hyperdense vessel. Skull: No acute fracture. Sinuses/Orbits: Clear sinuses.  No acute orbital findings. Other: No mastoid effusions. IMPRESSION: No evidence of acute intracranial abnormality. Electronically Signed   By: Thornton Dales  Yetta Barre M.D.   On: 07/01/2023 14:35    Micro Results   Recent Results (from the past 240 hour(s))  Culture, blood (routine x 2)     Status: None (Preliminary result)   Collection Time: 07/01/23  3:23 PM   Specimen: BLOOD RIGHT HAND  Result Value Ref Range Status   Specimen Description BLOOD RIGHT HAND  Final   Special Requests Blood Culture adequate volume  Final   Culture   Final    NO GROWTH 2 DAYS Performed at [facility], [address], [place], [place] [address]    Report Status PENDING  Incomplete  Resp panel by RT-PCR (RSV, Flu A&B, Covid) Anterior Nasal Swab     Status: None   Collection Time: 07/01/23  3:25 PM   Specimen: Anterior Nasal Swab  Result Value Ref Range Status   SARS Coronavirus 2 by RT PCR NEGATIVE NEGATIVE Final    Comment: (NOTE) SARS-CoV-2 target nucleic acids are NOT DETECTED.  The SARS-CoV-2 RNA is generally detectable in upper respiratory specimens during the acute phase of infection. The lowest concentration of SARS-CoV-2 viral copies this assay can detect is 138 copies/mL. A negative result does not preclude SARS-Cov-2 infection and should not be used as the sole basis for treatment or other patient management decisions. A negative result may occur  with  improper specimen collection/handling, submission of specimen other than nasopharyngeal swab, presence of viral mutation(s) within the areas targeted by this assay, and inadequate number of viral copies(<138 copies/mL). A negative result must be combined with clinical observations, patient history, and epidemiological information. The expected result is Negative.  Fact Sheet for Patients:  BloggerCourse.com  Fact Sheet for Healthcare Providers:  SeriousBroker.it  This test is no t yet approved or cleared by the [place] FDA and  has been authorized for detection and/or diagnosis of SARS-CoV-2 by FDA under an Emergency Use Authorization (EUA). This EUA will remain  in effect (meaning this test can be used) for the duration of the COVID-19 declaration under Section 564(b)(1) of the Act, 21 U.S.C.section 360bbb-3(b)(1), unless the authorization is terminated  or revoked sooner.       Influenza A by PCR NEGATIVE NEGATIVE Final   Influenza B by PCR NEGATIVE NEGATIVE Final    Comment: (NOTE) The Xpert Xpress SARS-CoV-2/FLU/RSV plus assay is intended as an aid in the diagnosis of influenza from Nasopharyngeal swab specimens and should not be used as a sole basis for treatment. Nasal washings and aspirates are unacceptable for Xpert Xpress SARS-CoV-2/FLU/RSV testing.  Fact Sheet for Patients: BloggerCourse.com  Fact Sheet for Healthcare Providers: SeriousBroker.it  This test is not yet approved or cleared by the [place] FDA and has been authorized for detection and/or diagnosis of SARS-CoV-2 by FDA under an Emergency Use Authorization (EUA). This EUA will remain in effect (meaning this test can be used) for the duration of the COVID-19 declaration under Section 564(b)(1) of the Act, 21 U.S.C. section 360bbb-3(b)(1), unless the authorization is terminated  or revoked.     Resp Syncytial Virus by PCR NEGATIVE NEGATIVE Final    Comment: (NOTE) Fact Sheet for Patients: BloggerCourse.com  Fact Sheet for Healthcare Providers: SeriousBroker.it  This test is not yet approved or cleared by the [place] FDA and has been authorized for detection and/or diagnosis of SARS-CoV-2 by FDA under an Emergency Use Authorization (EUA). This EUA will remain in effect (meaning this test can be used) for the duration of the COVID-19 declaration under Section 564(b)(1) of the  Act, 21 U.S.C. section 360bbb-3(b)(1), unless the authorization is terminated or revoked.  Performed at [facility], [address], [place], [place] [address]   Culture, blood (routine x 2)     Status: None (Preliminary result)   Collection Time: 07/01/23  3:25 PM   Specimen: BLOOD LEFT ARM  Result Value Ref Range Status   Specimen Description BLOOD LEFT ARM  Final   Special Requests Blood Culture adequate volume  Final   Culture   Final    NO GROWTH 2 DAYS Performed at [facility], [address], [place], [place] [address]    Report Status PENDING  Incomplete  MRSA Next Gen by PCR, Nasal     Status: None   Collection Time: 07/01/23  4:24 PM   Specimen: Nasal Mucosa; Nasal Swab  Result Value Ref Range Status   MRSA by PCR Next Gen NOT DETECTED NOT DETECTED Final    Comment: (NOTE) The GeneXpert MRSA Assay (FDA approved for NASAL specimens only), is one component of a comprehensive MRSA colonization surveillance program. It is not intended to diagnose MRSA infection nor to guide or monitor treatment for MRSA infections. Test performance is not FDA approved in patients less than 48 years old. Performed at [facility], [address], [place], [place] [address]   Group A Strep by PCR     Status: None   Collection Time: 07/01/23  4:24 PM   Specimen: Nasal Mucosa; Sterile Swab  Result Value Ref Range Status   Group A  Strep by PCR NOT DETECTED NOT DETECTED Final    Comment: Performed at [facility], [address] [place], [place] [address]   Today   Subjective    Carly Roady today has no new complaints  -Clinically much improved and back to baseline.   Patient and family members (daughter and Husband) at bedside No fever  Or chills   No Nausea, Vomiting or Diarrhea -Ambulating back-and-forth to the bathroom without dyspnea on exertion no chest pains or palpitations     Patient has been seen and examined prior to discharge   Objective   Blood pressure 135/70, pulse 60, temperature 97.6 F (36.4 C), temperature source Oral, resp. rate (!) 23, height 5\' 2"  (1.575 m), weight 105.5 kg, SpO2 100%.   Intake/Output Summary (Last 24 hours) at 07/03/2023 1453 Last data filed at 07/03/2023 0558 Gross per 24 hour  Intake 340 ml  Output --  Net 340 ml    Exam Gen:- Awake Alert, in no apparent distress  HEENT:- [place].AT, No sclera icterus, periorbital area and  facial area erythema and edema Ears--HOH Neck-Supple Neck,No JVD,.  Lungs-  CTAB , fair symmetrical air movement CV- S1, S2 normal, regular , 3/6 SM Abd-  +ve B.Sounds, Abd Soft, No tenderness,   no CVA tenderness Extremity/Skin:- No  edema, pedal pulses present  Psych-affect is appropriate, oriented x3 Neuro-no new focal deficits, no tremors   Data Review   CBC w Diff:  Lab Results  Component Value Date   WBC 8.1 07/03/2023   HGB 12.0 07/03/2023   HGB 14.7 09/13/2021   HCT 35.5 (L) 07/03/2023   HCT 43.0 09/13/2021   PLT 69 (L) 07/03/2023   PLT 100 (LL) 09/13/2021   LYMPHOPCT 7 07/01/2023   MONOPCT 7 07/01/2023   EOSPCT 0 07/01/2023   BASOPCT 0 07/01/2023    CMP:  Lab Results  Component Value Date   NA 139 07/03/2023   NA 142 09/28/2022   K 4.1 07/03/2023  CL 105 07/03/2023   CO2 25 07/03/2023   BUN 62 (H) 07/03/2023   BUN 35 (H) 09/28/2022   CREATININE 1.69 (H) 07/03/2023   CREATININE 1.77 (H) 07/03/2023    CREATININE 0.67 12/20/2012   PROT 5.2 (L) 07/03/2023   PROT 5.2 (L) 01/21/2022   ALBUMIN 2.5 (L) 07/03/2023   ALBUMIN 2.5 (L) 03/01/2022   BILITOT 2.3 (H) 07/03/2023   BILITOT 2.6 (H) 01/21/2022   ALKPHOS 68 07/03/2023   AST 39 07/03/2023   ALT 19 07/03/2023  .  Total Discharge time is about 33 minutes  Shon Hale M.D on 07/03/2023 at 2:53 PM  Go to [email or web address] -  for contact info  [facility] - Office  [phone]

## 2023-07-03 NOTE — Care Management Important Message (Signed)
Important Message  Patient Details  Name: Stacey Summers MRN: [id number] Date of Birth: Feb 17, 1946   Important Message Given:  N/A - LOS <3 / Initial given by admissions     Corey Harold 07/03/2023, 12:38 PM

## 2023-07-03 NOTE — TOC Transition Note (Signed)
Transition of Care [facility]) - CM/SW Discharge Note   Patient Details  Name: Stacey Summers MRN: [id number] Date of Birth: 06-06-1946  [facility] CM/SW Contact:  Leitha Bleak, RN Phone Number: 07/03/2023, 2:47 PM   Clinical Narrative:   Patient admitted with sepsis. TOC consulted for CHF. CM spoke with her daughter, [name]. [name] states her father has CHF as well, they are aware of daily weight and fluid restriction. They are not interested in [facility] at this time. Patient is being discharged today. Daughter has made a PCP appointment in the morning for follow up, she has some concerns with Sepsis diagnosis and CHF labs. She will follow up with PCP for education and med changes.    Final next level of care: Home/Self Care Barriers to Discharge: No Barriers Identified   Patient Goals and CMS Choice CMS Medicare.gov Compare Post Acute Care list provided to:: Patient Choice offered to / list presented to : Patient  Discharge Placement      Discharge Plan and Services Additional resources added to the After Visit Summary for          Social Determinants of Health (SDOH) Interventions SDOH Screenings   Food Insecurity: No Food Insecurity (07/01/2023)  Housing: Low Risk  (07/01/2023)  Transportation Needs: No Transportation Needs (07/01/2023)  Utilities: Not At Risk (07/01/2023)  Alcohol Screen: Low Risk  (12/16/2022)  Depression (PHQ2-9): Low Risk  (03/27/2023)  Financial Resource Strain: Low Risk  (12/16/2022)  Physical Activity: Inactive (12/16/2022)  Social Connections: Socially Integrated (12/16/2022)  Stress: No Stress Concern Present (12/16/2022)  Tobacco Use: Medium Risk (07/01/2023)    Readmission Risk Interventions    07/03/2023    2:47 PM  Readmission Risk Prevention Plan  Post Dischage Appt Complete  Medication Screening Complete  Transportation Screening Complete

## 2023-07-03 NOTE — Plan of Care (Signed)
Received tramadol once during the shift for head,eye, and ear pain. One person assist to bathroom with walker.  Problem: Education: Goal: Knowledge of General Education information will improve Description: Including pain rating scale, medication(s)/side effects and non-pharmacologic comfort measures Outcome: Progressing   Problem: Health Behavior/Discharge Planning: Goal: Ability to manage health-related needs will improve Outcome: Progressing   Problem: Clinical Measurements: Goal: Ability to maintain clinical measurements within normal limits will improve Outcome: Progressing Goal: Will remain free from infection Outcome: Progressing Goal: Diagnostic test results will improve Outcome: Progressing Goal: Respiratory complications will improve Outcome: Progressing Goal: Cardiovascular complication will be avoided Outcome: Progressing   Problem: Activity: Goal: Risk for activity intolerance will decrease Outcome: Progressing   Problem: Nutrition: Goal: Adequate nutrition will be maintained Outcome: Progressing   Problem: Coping: Goal: Level of anxiety will decrease Outcome: Progressing   Problem: Elimination: Goal: Will not experience complications related to bowel motility Outcome: Progressing Goal: Will not experience complications related to urinary retention Outcome: Progressing   Problem: Pain Management: Goal: General experience of comfort will improve Outcome: Progressing   Problem: Safety: Goal: Ability to remain free from injury will improve Outcome: Progressing   Problem: Skin Integrity: Goal: Risk for impaired skin integrity will decrease Outcome: Progressing   Problem: Education: Goal: Ability to demonstrate management of disease process will improve Outcome: Progressing Goal: Ability to verbalize understanding of medication therapies will improve Outcome: Progressing Goal: Individualized Educational Video(s) Outcome: Progressing   Problem:  Activity: Goal: Capacity to carry out activities will improve Outcome: Progressing   Problem: Cardiac: Goal: Ability to achieve and maintain adequate cardiopulmonary perfusion will improve Outcome: Progressing   Problem: Education: Goal: Ability to describe self-care measures that may prevent or decrease complications (Diabetes Survival Skills Education) will improve Outcome: Progressing Goal: Individualized Educational Video(s) Outcome: Progressing   Problem: Coping: Goal: Ability to adjust to condition or change in health will improve Outcome: Progressing   Problem: Fluid Volume: Goal: Ability to maintain a balanced intake and output will improve Outcome: Progressing   Problem: Health Behavior/Discharge Planning: Goal: Ability to identify and utilize available resources and services will improve Outcome: Progressing Goal: Ability to manage health-related needs will improve Outcome: Progressing   Problem: Metabolic: Goal: Ability to maintain appropriate glucose levels will improve Outcome: Progressing   Problem: Nutritional: Goal: Maintenance of adequate nutrition will improve Outcome: Progressing Goal: Progress toward achieving an optimal weight will improve Outcome: Progressing   Problem: Skin Integrity: Goal: Risk for impaired skin integrity will decrease Outcome: Progressing   Problem: Tissue Perfusion: Goal: Adequacy of tissue perfusion will improve Outcome: Progressing

## 2023-07-03 NOTE — Discharge Instructions (Signed)
1)Repeat CBC and CMP blood test within a week with PCP 2)Patient notes that some of your medications has been adjusted 3) take lactulose as prescribed--- goal is to have 2-3 soft/mushy BMs per day, avoid constipation

## 2023-07-04 ENCOUNTER — Encounter: Payer: Self-pay | Admitting: Nurse Practitioner

## 2023-07-04 ENCOUNTER — Ambulatory Visit (INDEPENDENT_AMBULATORY_CARE_PROVIDER_SITE_OTHER): Payer: PPO | Admitting: Nurse Practitioner

## 2023-07-04 ENCOUNTER — Other Ambulatory Visit (HOSPITAL_COMMUNITY): Payer: Self-pay

## 2023-07-04 ENCOUNTER — Other Ambulatory Visit: Payer: Self-pay | Admitting: *Deleted

## 2023-07-04 ENCOUNTER — Telehealth: Payer: Self-pay | Admitting: *Deleted

## 2023-07-04 ENCOUNTER — Telehealth (HOSPITAL_COMMUNITY): Payer: Self-pay | Admitting: Pharmacy Technician

## 2023-07-04 VITALS — BP 112/66 | HR 43 | Temp 97.1°F | Ht 62.0 in

## 2023-07-04 DIAGNOSIS — Z09 Encounter for follow-up examination after completed treatment for conditions other than malignant neoplasm: Secondary | ICD-10-CM | POA: Diagnosis not present

## 2023-07-04 DIAGNOSIS — R600 Localized edema: Secondary | ICD-10-CM

## 2023-07-04 DIAGNOSIS — A419 Sepsis, unspecified organism: Secondary | ICD-10-CM | POA: Diagnosis not present

## 2023-07-04 DIAGNOSIS — G9341 Metabolic encephalopathy: Secondary | ICD-10-CM

## 2023-07-04 DIAGNOSIS — R6520 Severe sepsis without septic shock: Secondary | ICD-10-CM

## 2023-07-04 DIAGNOSIS — I5033 Acute on chronic diastolic (congestive) heart failure: Secondary | ICD-10-CM | POA: Diagnosis not present

## 2023-07-04 NOTE — Telephone Encounter (Signed)
Pharmacy Patient Advocate Encounter  Received notification from [facility] [employer] that Prior Authorization for Xifaxan 550MG  tablets has been APPROVED from 07/04/2023 to 07/03/2024   Key: [facility]

## 2023-07-04 NOTE — Transitions of Care (Post Inpatient/ED Visit) (Addendum)
07/04/2023  Name: Stacey Summers MRN: [id number] DOB: Apr 10, 1946  Today's TOC FU Call Status: Today's TOC FU Call Status:: Successful TOC FU Call Completed TOC FU Call Complete Date: 07/04/23 Patient's Name and Date of Birth confirmed.  Transition Care Management Follow-up Telephone Call Date of Discharge: 07/03/23 Discharge Facility: [facility] ([facility]) Type of Discharge: Inpatient Admission Primary Inpatient Discharge Diagnosis:: Admission Diagnosis  Anasarca (R60.1)  Sepsis (HCC) (A41.9)  Severe sepsis (HCC) (A41.9, R65.20) How have you been since you were released from the hospital?: Better (pt states " fluid is much better",  having regular BM's, taking lactulose, appetite good) Any questions or concerns?: No  Items Reviewed: Did you receive and understand the discharge instructions provided?: Yes Medications obtained,verified, and reconciled?: Yes (Medications Reviewed) Any new allergies since your discharge?: No Dietary orders reviewed?: Yes Type of Diet Ordered:: low sodium, heart healthy Do you have support at home?: Yes People in Home: spouse Name of Support/Comfort Primary Source: spouse Stacey Summers      daughter Stacey Summers   Goals Addressed             This Visit's Progress    Transition of Care- pt will have no readmission within 30 days       Current Barriers:  Chronic Disease Management support and education needs related to CHF  Spoke with patient and patient's daughter Stacey Summers Daughter reports pt has all medications and taking as prescribed  RNCM Clinical Goal(s):  Patient will verbalize understanding of plan for management of CHF as evidenced by patient report, review of EMR take all medications exactly as prescribed and will call provider for medication related questions as evidenced by patient report, review of EMR  through collaboration with RN Care manager, provider, and care team.   Interventions: Evaluation of current treatment plan  related to  self management and patient's adherence to plan as established by provider   Heart Failure Interventions:  (Status:  New goal. and Goal on track:  Yes.) Short Term Goal Basic overview and discussion of pathophysiology of Heart Failure reviewed Provided education on low sodium diet Reviewed Heart Failure Action Plan in depth and provided written copy Assessed need for readable accurate scales in home Provided education about placing scale on hard, flat surface Advised patient to weigh each morning after emptying bladder Discussed importance of daily weight and advised patient to weigh and record daily Reviewed role of diuretics in prevention of fluid overload and management of heart failure; Discussed the importance of keeping all appointments with provider Assessed social determinant of health barriers  Reviewed discharge instructions/ AVS- AVOID STRAINING STOP ANY ACTIVITY THAT CAUSES CHEST PAIN, SHORTNESS OF BREATH, DIZZINESS, SWEATING, OR EXCESSIVE WEAKNESS. Reviewed importance of taking Lactulose as prescribed for management of cirrhosis Reviewed upcoming appointments including today with primary care provider   Patient Goals/Self-Care Activities: Participate in Transition of Care Program/Attend [facility] scheduled calls Notify RN Care Manager of TOC call rescheduling needs Take all medications as prescribed Attend all scheduled provider appointments Call pharmacy for medication refills 3-7 days in advance of running out of medications Call provider office for new concerns or questions  call office if I gain more than 2 pounds in one day or 5 pounds in one week watch for swelling in feet, ankles and legs every day weigh myself daily follow rescue plan if symptoms flare-up eat more whole grains, fruits and vegetables, lean meats and healthy fats know when to call the doctor:follow heart failure action plan ,  call doctor early on for change in health status/  symptoms  Follow Up Plan:  Telephone follow up appointment with care management team member scheduled for:  07/11/23 at 1030 am           Medications Reviewed Today: Medications Reviewed Today     Reviewed by Stacey Gallus, RN (Registered Nurse) on 07/04/23 at 1006  Med List Status: <None>   Medication Order Taking? Sig Documenting Provider Last Dose Status Informant  albuterol (VENTOLIN HFA) 108 (90 Base) MCG/ACT inhaler [id number] Yes Inhale 2 puffs into the lungs every 6 (six) hours as needed for wheezing or shortness of breath. Shon Hale, MD Taking Active   ALPRAZolam Prudy Feeler) 0.25 MG tablet [id number] Yes Take 1/2-1 tablet once daily as needed for severe anxiety/ panic attack. May cause hallucinations/ falls Delynn Flavin M, DO Taking Active Self, Family Member  amoxicillin-clavulanate (AUGMENTIN) 875-125 MG tablet [id number] Yes Take 1 tablet by mouth 2 (two) times daily for 5 days. Shon Hale, MD Taking Active   ascorbic acid (VITAMIN C) 500 MG tablet [id number] Yes Take 500 mg by mouth daily. [provider] Taking Active Self, Family Member  Biotin 5000 MCG SUBL [id number] Yes Place under the tongue. [provider] Taking Active Self, Family Member  blood glucose meter kit and supplies KIT [id number] Yes Dispense based on patient and insurance preference. Use up to four times daily as directed. (FOR ICD-9 250.00, 250.01). Mechele Claude, MD Taking Active Self, Family Member  Blood Glucose Monitoring Suppl [facility] VERIO FLEX SYSTEM) w/Device Andria Rhein [id number] Yes Check Bs up to 4 times daily Dx E11.9, [name], DO Taking Active Self, Family Member  Cholecalciferol (VITAMIN D3) 5000 units CAPS [id number] Yes Take 5,000 Units by mouth daily. [provider] Taking Active Self, Family Member  cyclobenzaprine (FLEXERIL) 5 MG tablet [id number] Yes TAKE 1 TABLET BY MOUTH THREE TIMES DAILY AS NEEDED FOR MUSCLE SPASM . APPOINTMENT REQUIRED FOR  FUTURE REFILLS Rakes, Doralee Albino, FNP Taking Active Self, Family Member  diltiazem (CARDIZEM CD) 120 MG 24 hr capsule [id number] Yes Take 1 capsule (120 mg total) by mouth every Monday, Wednesday, and Friday. Shon Hale, MD Taking Active   fluticasone (FLONASE) 50 MCG/ACT nasal spray [id number] Yes Place 2 sprays into both nostrils daily. Shon Hale, MD Taking Active   furosemide (LASIX) 40 MG tablet [id number] Yes Take 1 tablet (40 mg total) by mouth 2 (two) times daily. Shon Hale, MD Taking Active   glucose blood (ONETOUCH VERIO) test strip [id number] Yes Check Bs up to 4 times daily Dx E11.9, Delynn Flavin M, DO Taking Active Self, Family Member  hydrocortisone cream 1 % [id number] Yes Apply 1 Application topically 2 (two) times daily. Apply a sparingly (thin film) to facial rash twice daily for up to a week Shon Hale, MD Taking Active   lactulose (CHRONULAC) 10 GM/15ML solution [id number] Yes TAKE 15 ML BY MOUTH  THREE TIMES DAILY AS NEEDED Mariea Clonts, Courage, MD Taking Active   meclizine (ANTIVERT) 25 MG tablet [id number] Yes TAKE 1 TABLET BY MOUTH THREE TIMES DAILY AS NEEDED FOR DIZZINESS [name], DO Taking Active Self, Family Member  Misc Natural Products (BEET ROOT PO) [id number] Yes Take 1 Capful by mouth daily. [provider] Taking Active Self, Family Member  ondansetron (ZOFRAN-ODT) 4 MG disintegrating tablet [id number] Yes DISSOLVE 1 TABLET IN MOUTH EVERY 6 HOURS AS NEEDED FOR NAUSEA AND VOMITING [name], DO Taking Active Self, Family Member  [facility] LANCETS  33G MISC [id number] Yes USE ONE LANCET TO CHECK GLUCOSE ONCE DAILY AND AS NEEDED Daphine Deutscher Mary-Margaret, FNP Taking Active Self, Family Member  spironolactone (ALDACTONE) 50 MG tablet [id number] Yes Take 0.5 tablets (25 mg total) by mouth daily. Shon Hale, MD Taking Active   XIFAXAN 550 MG TABS tablet [id number] Yes Take 1 tablet (550 mg total) by mouth 2 (two) times daily.  Shon Hale, MD Taking Active             Home Care and Equipment/Supplies: Were Home Health Services Ordered?: No Any new equipment or medical supplies ordered?: No  Functional Questionnaire: Do you need assistance with bathing/showering or dressing?: No Do you need assistance with meal preparation?: No Do you need assistance with eating?: No Do you have difficulty maintaining continence: No Do you need assistance with getting out of bed/getting out of a chair/moving?: No Do you have difficulty managing or taking your medications?: No  Follow up appointments reviewed: PCP Follow-up appointment confirmed?: Yes Date of PCP follow-up appointment?: 07/04/23 Follow-up Provider: Richardson Landry DNP  @ 1145 am Specialist Hospital Follow-up appointment confirmed?: No Reason Specialist Follow-Up Not Confirmed: Patient has Specialist Provider Number and will Call for Appointment Do you need transportation to your follow-up appointment?: No Do you understand care options if your condition(s) worsen?: Yes-patient verbalized understanding  SDOH Interventions Today    Flowsheet Row Most Recent Value  SDOH Interventions   Food Insecurity Interventions Intervention Not Indicated  Housing Interventions Intervention Not Indicated  Transportation Interventions Intervention Not Indicated  Utilities Interventions Intervention Not Indicated       Irving [facility], BSN RN Care Manager/ Transition of Care [place]/ [facility] Population [facility] [phone]

## 2023-07-04 NOTE — Progress Notes (Unsigned)
Established Patient Office Visit  Subjective   Patient ID: Stacey Summers, female    DOB: 1946-06-13  Age: 77 y.o. MRN: [id number]  Chief Complaint  Patient presents with  . Hospitalization Follow-up    Went to hospital Saturday morning for fever of 105 eyes and face were extremely swollen, she would not respond, confusion when she came to. Hospital dx her with congestive heart failure, sepsis, URI    HPI Stacey Summers 77 year old female presents No 74, 2024 with her daughter for posthospital discharge follow-up. PMH   Patient Active Problem List   Diagnosis Date Noted  . Sepsis (HCC) 07/01/2023  . Acute on chronic diastolic CHF (congestive heart failure) (HCC) 07/01/2023  . Chronic diastolic heart failure (HCC) 03/07/2023  . Chronic right heart failure (HCC) 03/07/2023  . Contraindication to statin medication 11/25/2022  . Onychomycosis 11/25/2022  . Chronic left-sided low back pain with left-sided sciatica 11/25/2022  . B12 deficiency 03/31/2021  . Left shoulder pain 08/12/2020  . Left knee pain 08/12/2020  . Encephalopathy, hepatic (HCC) 08/01/2019  . Thrombocytopenia (HCC) 07/05/2019  . Hyperlipidemia associated with type 2 diabetes mellitus (HCC) 04/02/2019  . Liver cirrhosis secondary to NASH (HCC) 07/11/2018  . Degeneration of lumbar intervertebral disc 12/28/2017  . Lumbar radiculopathy 12/28/2017  . Spondylolisthesis, grade 1 12/28/2017  . Chronic pain of right knee 12/19/2017  . Genetic testing 03/08/2017  . Restrictive lung disease secondary to obesity 08/04/2015  . Physical deconditioning 08/04/2015  . OSA treated with BiPAP 08/04/2015  . Gastroesophageal reflux disease without esophagitis 12/25/2014  . Low serum vitamin D 05/28/2014  . Osteopenia 05/28/2014  . COPD (chronic obstructive pulmonary disease) (HCC) 01/29/2014  . Dyspnea on exertion 01/07/2014  . Fatigue March 10, 2013  . Murmur, cardiac 10-Mar-2013  . Diabetes (HCC) 03/10/2013  . Morbid  obesity (HCC) 10-Mar-2013  . Family history of sudden death March 10, 2013  . Hypertension associated with diabetes (HCC) 12/20/2012  . GAD (generalized anxiety disorder) 12/20/2012  . Fibromyalgia muscle pain 12/20/2012   Past Medical History:  Diagnosis Date  . Allergy    seasonal  . Anxiety   . Arthritis    hands, back   . B12 deficiency 03/31/2021  . Cataract    surgery to remove  . Chronic back pain    lower back   . Cirrhosis of liver without ascites (HCC)    recent dx 07/11/18  . Closed fracture of right tibial plateau 01/31/2018  . Complex tear of medial meniscus of right knee 03/27/2018  . COPD (chronic obstructive pulmonary disease) (HCC)    no inhaler  . Diabetes mellitus without complication (HCC)   . DJD (degenerative joint disease)   . Fibromyalgia   . Genetic testing 03/08/2017   Ms. Smeltz underwent genetic counseling and testing for hereditary cancer syndromes on 02/21/2017. Her results were negative for mutations in all 83 genes analyzed by [facility]'s 83-gene Multi-Cancers Panel. Genes analyzed include: ALK, APC, ATM, AXIN2, BAP1, BARD1, BLM, BMPR1A, BRCA1, BRCA2, BRIP1, CASR, CDC73, CDH1, CDK4, CDKN1B, CDKN1C, CDKN2A, CEBPA, CHEK2, CTNNA1, DICER1, DIS3L2, EGFR, EPCAM  . GERD (gastroesophageal reflux disease)   . 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 History of abnormal mammogram   . History of arthroplasty of right knee 07/06/2018  . Hyperlipidemia   . Hypertension   . Irregular heart beat   . Neuromuscular disorder (HCC)    fibromyalgia  . Sleep apnea    cpap some  . Stress fracture of  right tibia 03/27/2018  . SVD (spontaneous vaginal delivery)    x 2  . Tubular adenoma of colon 05/09/12   "innumerable number" of flat polyps   Past Surgical History:  Procedure Laterality Date  . CARPAL TUNNEL RELEASE Left 10/20/14   [facility]  . CATARACT EXTRACTION, BILATERAL    . CESAREAN SECTION  1977  . CHOLECYSTECTOMY  1990  . COLONOSCOPY    . IR  RADIOLOGIST EVAL & MGMT  07/16/2019  . KNEE ARTHROSCOPY WITH SUBCHONDROPLASTY Right 03/27/2018   Procedure: Right knee arthroscopic partial medial meniscus repair versus menisectomy with medial tibia subchondroplasty;  Surgeon: Yolonda Kida, MD;  Location: [facility] OR;  Service: Orthopedics;  Laterality: Right;  120 mins  . LARYNX SURGERY     POLYP REMOVAL  . MULTIPLE TOOTH EXTRACTIONS    . POLYPECTOMY     vocal cord  . TONSILLECTOMY  1965  . TUBAL LIGATION    . TUMOR REMOVAL     benign fatty tumors on abdomen  . UPPER GASTROINTESTINAL ENDOSCOPY     Social History   Tobacco Use  . Smoking status: Former    Current packs/day: 0.00    Average packs/day: 1 pack/day for 3.0 years (3.0 ttl pk-yrs)    Types: Cigarettes    Start date: 08/15/1966    Quit date: 08/15/1969    Years since quitting: 53.9  . Smokeless tobacco: Never  Vaping Use  . Vaping status: Never Used  Substance Use Topics  . Alcohol use: No    Alcohol/week: 0.0 standard drinks of alcohol  . Drug use: No   Social History   Socioeconomic History  . Marital status: Married    Spouse name: Molly Maduro "goes by [employer]  . Number of children: 3  . Years of education: 8  . Highest education level: GED or equivalent  Occupational History  . Occupation: retired  Tobacco Use  . Smoking status: Former    Current packs/day: 0.00    Average packs/day: 1 pack/day for 3.0 years (3.0 ttl pk-yrs)    Types: Cigarettes    Start date: 08/15/1966    Quit date: 08/15/1969    Years since quitting: 53.9  . Smokeless tobacco: Never  Vaping Use  . Vaping status: Never Used  Substance and Sexual Activity  . Alcohol use: No    Alcohol/week: 0.0 standard drinks of alcohol  . Drug use: No  . Sexual activity: Not Currently  Other Topics Concern  . Not on file  Social History Narrative   Daughter lives with them and helps a lot   Social Determinants of Health   Financial Resource Strain: Low Risk  (12/16/2022)   Overall Financial  Resource Strain (CARDIA)   . Difficulty of Paying Living Expenses: Not hard at all  Food Insecurity: No Food Insecurity (07/04/2023)   Hunger Vital Sign   . Worried About Programme researcher, broadcasting/film/video in the Last Year: Never true   . Ran Out of Food in the Last Year: Never true  Transportation Needs: No Transportation Needs (07/04/2023)   PRAPARE - Transportation   . Lack of Transportation (Medical): No   . Lack of Transportation (Non-Medical): No  Physical Activity: Inactive (12/16/2022)   Exercise Vital Sign   . Days of Exercise per Week: 0 days   . Minutes of Exercise per Session: 0 min  Stress: No Stress Concern Present (12/16/2022)   [employer] of Occupational Health - Occupational Stress Questionnaire   . Feeling of Stress : Not  at all  Social Connections: Socially Integrated (12/16/2022)   Social Connection and Isolation Panel [NHANES]   . Frequency of Communication with Friends and Family: More than three times a week   . Frequency of Social Gatherings with Friends and Family: More than three times a week   . Attends Religious Services: More than 4 times per year   . Active Member of Clubs or Organizations: Yes   . Attends Banker Meetings: More than 4 times per year   . Marital Status: Married  Catering manager Violence: Not At Risk (07/04/2023)   Humiliation, Afraid, Rape, and Kick questionnaire   . Fear of Current or Ex-Partner: No   . Emotionally Abused: No   . Physically Abused: No   . Sexually Abused: No   Family Status  Relation Name Status  . Mother  Deceased at age 87  . Father  Deceased at age 95  . Daughter  Alive  . Mat Aunt  Deceased  . Mat Aunt  (Not Specified)  . Mat Uncle  Deceased  . Emelda Brothers  Deceased  . Emelda Brothers  Deceased  . Emelda Brothers  Deceased  . [employer]  . [employer]  Alive  . [employer]  Deceased  . MGF  Deceased  . PGM  Deceased  . PGF  Deceased  . Cousin  Deceased  . Brother  Deceased  . Brother  Deceased at age stillborn  . Son   Alive  . Son  Alive  . Other granddaughter Alive  . Neg Hx  (Not Specified)  No partnership data on file   Family History  Problem Relation Age of Onset  . Heart failure Mother        d.37  . Heart disease Mother   . Prostate cancer Father        d.89 diagnosed in late-60s  . Colon cancer Father        diagnosed at 73  . Leukemia Maternal Aunt   . Heart failure Maternal Uncle        d.50  . Stomach cancer Paternal Aunt        questionable  . Colon cancer Paternal Uncle        in his 56s  . Heart failure Maternal Grandmother        d.45  . Kidney cancer Cousin        d.78s maternal first-cousin  . Colon polyps Brother        polyposis. Underwent partial colectomy at age 60.  Marland Kitchen Spina bifida Brother        died at birth  . Colon polyps Son 64  . Heart attack Son 84  . Other Other 16       multiple lipomas  . Esophageal cancer Neg Hx   . Rectal cancer Neg Hx   . Breast cancer Neg Hx    Allergies  Allergen Reactions  . Latex Other (See Comments)    "my hands feels numb"      Vision Screening   Right eye Left eye Both eyes  Without correction 20/40 20/30 20/25   With correction        ROS Negative unless indicated in HPI   Objective:     BP 112/66 (BP Location: Left Arm, Patient Position: Sitting)   Pulse (!) 43   Temp (!) 97.1 F (36.2 C) (Temporal)   Ht 5\' 2"  (1.575 m)   SpO2 98%   BMI 42.54 kg/m  BP Readings from Last 3 Encounters:  07/04/23 112/66  07/03/23 135/70  05/30/23 (!) 158/73   Wt Readings from Last 3 Encounters:  07/03/23 232 lb 9.4 oz (105.5 kg)  04/19/23 229 lb (103.9 kg)  03/27/23 222 lb 6 oz (100.9 kg)      Physical Exam   No results found for any visits on 07/04/23.  Last CBC Lab Results  Component Value Date   WBC 8.1 07/03/2023   HGB 12.0 07/03/2023   HCT 35.5 (L) 07/03/2023   MCV 101.1 (H) 07/03/2023   MCH 34.2 (H) 07/03/2023   RDW 13.2 07/03/2023   PLT 69 (L) 07/03/2023   Last metabolic panel Lab Results   Component Value Date   GLUCOSE 108 (H) 07/03/2023   NA 139 07/03/2023   K 4.1 07/03/2023   CL 105 07/03/2023   CO2 25 07/03/2023   BUN 62 (H) 07/03/2023   CREATININE 1.69 (H) 07/03/2023   CREATININE 1.77 (H) 07/03/2023   GFRNONAA 31 (L) 07/03/2023   GFRNONAA 30 (L) 07/03/2023   CALCIUM 8.4 (L) 07/03/2023   PHOS 3.3 03/01/2022   PROT 5.2 (L) 07/03/2023   ALBUMIN 2.5 (L) 07/03/2023   LABGLOB 2.1 01/21/2022   AGRATIO 1.5 01/21/2022   BILITOT 2.3 (H) 07/03/2023   ALKPHOS 68 07/03/2023   AST 39 07/03/2023   ALT 19 07/03/2023   ANIONGAP 9 07/03/2023   Last lipids Lab Results  Component Value Date   CHOL 103 06/28/2021   HDL 48 06/28/2021   LDLCALC 42 06/28/2021   TRIG 55 06/28/2021   CHOLHDL 2.1 06/28/2021   Last hemoglobin A1c Lab Results  Component Value Date   HGBA1C 5.1 07/02/2023   Last thyroid functions Lab Results  Component Value Date   TSH 4.090 01/01/2019   T4TOTAL 7.9 04/02/2013        Assessment & Plan:  Peripheral edema -     BMP8+EGFR -     Brain natriuretic peptide  B12 deficiency    Return for as already scheduled with PCP.    @Raunel Dimartino  Janee Morn, DNP@

## 2023-07-05 ENCOUNTER — Other Ambulatory Visit: Payer: PPO

## 2023-07-05 DIAGNOSIS — Z09 Encounter for follow-up examination after completed treatment for conditions other than malignant neoplasm: Secondary | ICD-10-CM | POA: Insufficient documentation

## 2023-07-05 DIAGNOSIS — D631 Anemia in chronic kidney disease: Secondary | ICD-10-CM | POA: Diagnosis not present

## 2023-07-05 DIAGNOSIS — N189 Chronic kidney disease, unspecified: Secondary | ICD-10-CM | POA: Diagnosis not present

## 2023-07-05 DIAGNOSIS — R809 Proteinuria, unspecified: Secondary | ICD-10-CM | POA: Diagnosis not present

## 2023-07-05 LAB — BMP8+EGFR
BUN/Creatinine Ratio: 38 — ABNORMAL HIGH (ref 12–28)
BUN: 55 mg/dL — ABNORMAL HIGH (ref 8–27)
CO2: 25 mmol/L (ref 20–29)
Calcium: 8.8 mg/dL (ref 8.7–10.3)
Chloride: 102 mmol/L (ref 96–106)
Creatinine, Ser: 1.43 mg/dL — ABNORMAL HIGH (ref 0.57–1.00)
Glucose: 205 mg/dL — ABNORMAL HIGH (ref 70–99)
Potassium: 4.7 mmol/L (ref 3.5–5.2)
Sodium: 139 mmol/L (ref 134–144)
eGFR: 38 mL/min/{1.73_m2} — ABNORMAL LOW (ref >59)

## 2023-07-05 LAB — BRAIN NATRIURETIC PEPTIDE: BNP: 304 pg/mL — ABNORMAL HIGH (ref 0.0–100.0)

## 2023-07-07 LAB — CULTURE, BLOOD (ROUTINE X 2)
Culture: NO GROWTH
Culture: NO GROWTH
Special Requests: ADEQUATE
Special Requests: ADEQUATE

## 2023-07-10 ENCOUNTER — Inpatient Hospital Stay: Payer: PPO | Attending: Hematology

## 2023-07-10 ENCOUNTER — Telehealth: Payer: Self-pay | Admitting: Gastroenterology

## 2023-07-10 VITALS — BP 158/57 | HR 45 | Temp 98.1°F | Resp 18

## 2023-07-10 DIAGNOSIS — E538 Deficiency of other specified B group vitamins: Secondary | ICD-10-CM | POA: Diagnosis not present

## 2023-07-10 DIAGNOSIS — R809 Proteinuria, unspecified: Secondary | ICD-10-CM | POA: Diagnosis not present

## 2023-07-10 DIAGNOSIS — E1129 Type 2 diabetes mellitus with other diabetic kidney complication: Secondary | ICD-10-CM | POA: Diagnosis not present

## 2023-07-10 DIAGNOSIS — N1831 Chronic kidney disease, stage 3a: Secondary | ICD-10-CM | POA: Diagnosis not present

## 2023-07-10 DIAGNOSIS — E1122 Type 2 diabetes mellitus with diabetic chronic kidney disease: Secondary | ICD-10-CM | POA: Diagnosis not present

## 2023-07-10 MED ORDER — CYANOCOBALAMIN 1000 MCG/ML IJ SOLN
1000.0000 ug | Freq: Once | INTRAMUSCULAR | Status: AC
  Administered 2023-07-10: 1000 ug via INTRAMUSCULAR
  Filled 2023-07-10: qty 1

## 2023-07-10 NOTE — Telephone Encounter (Signed)
Spoke with [name]. Form for the application mailed to her. She will return it with the patient's part completed.

## 2023-07-10 NOTE — Progress Notes (Signed)
Stacey Summers presents today for injection per the provider's orders.  B12 administration without incident; injection site WNL; see MAR for injection details.  Patient tolerated procedure well and without incident.  No questions or complaints noted at this time. Discharged via wheelchair in c/o family in stable condition.

## 2023-07-10 NOTE — Telephone Encounter (Signed)
PT daughter is calling to find out about doing new patient assistance paperwork for Xifaxin for the new year please advise.

## 2023-07-10 NOTE — Patient Instructions (Signed)
[facility]  Discharge Instructions  You received a B12 injection today.   Return as scheduled.    Thank you for choosing [facility] to provide your oncology and hematology care.   To afford each patient quality time with our provider, please arrive at least 15 minutes before your scheduled appointment time. You may need to reschedule your appointment if you arrive late (10 or more minutes). Arriving late affects you and other patients whose appointments are after yours.  Also, if you miss three or more appointments without notifying the office, you may be dismissed from the clinic at the provider's discretion.    Again, thank you for choosing [facility].  Our hope is that these requests will decrease the amount of time that you wait before being seen by our physicians.   If you have a lab appointment with the Cancer Center - please note that after April 8th, all labs will be drawn in the cancer center.  You do not have to check in or register with the main entrance as you have in the past but will complete your check-in at the cancer center.            _____________________________________________________________  Should you have questions after your visit to [facility], please contact our office at [phone] and follow the prompts.  Our office hours are 8:00 a.m. to 4:30 p.m. Monday - Thursday and 8:00 a.m. to 2:30 p.m. Friday.  Please note that voicemails left after 4:00 p.m. may not be returned until the following business day.  We are closed weekends and all major holidays.  You do have access to a nurse 24-7, just call the main number to the clinic [phone] and do not press any options, hold on the line and a nurse will answer the phone.    For prescription refill requests, have your pharmacy contact our office and allow 72 hours.    Masks are no longer required in the cancer centers. If you would  like for your care team to wear a mask while they are taking care of you, please let them know. You may have one support person who is at least 75 years old accompany you for your appointments.

## 2023-07-10 NOTE — Telephone Encounter (Signed)
Stacey Summers I will keep my eye out when she mails it back to [place], or she will probably hand deliver it I'm sure. I will fax it to the rx team then and keep it on my desk

## 2023-07-11 ENCOUNTER — Other Ambulatory Visit: Payer: Self-pay | Admitting: *Deleted

## 2023-07-11 ENCOUNTER — Inpatient Hospital Stay: Payer: PPO

## 2023-07-11 NOTE — Patient Outreach (Signed)
  Care Management  Transitions of Care Program Transitions of Care Post-discharge week 2  07/11/2023 Name: Stacey Summers MRN: [id number] DOB: 01/29/1946  Subjective: Stacey Summers is a 77 y.o. year old female who is a primary care patient of [name], DO. The Care Management team was unable to reach the patient by phone to assess and address transitions of care needs.   Plan: Additional outreach attempts will be made to reach the patient enrolled in the [facility] Program (Post Inpatient/ED Visit).  Will attempt to reach patient next week (for week 3)  Irving Shows [facility], BSN RN Care Manager/ Transition of Care [place]/ [facility] [phone]

## 2023-07-12 ENCOUNTER — Telehealth: Payer: Self-pay | Admitting: *Deleted

## 2023-07-12 NOTE — Patient Outreach (Signed)
  Care Management  Transitions of Care Program Transitions of Care Post-discharge week 2  07/12/2023 Name: [name] MRN: [id number] DOB: 1946/03/17  Subjective: Stacey Summers is a 77 y.o. year old female who is a primary care patient of [name], DO. The Care Management team was unable to reach the patient by phone to assess and address transitions of care needs.   Plan: Additional outreach attempts will be made to reach the patient enrolled in the [facility] Program (Post Inpatient/ED Visit).  2nd attempt.  Irving Shows [facility], BSN RN Care Manager/ Transition of Care [place]/ [facility] [phone]

## 2023-07-17 ENCOUNTER — Other Ambulatory Visit: Payer: Self-pay | Admitting: Family Medicine

## 2023-07-17 ENCOUNTER — Telehealth: Payer: Self-pay | Admitting: *Deleted

## 2023-07-17 DIAGNOSIS — E1122 Type 2 diabetes mellitus with diabetic chronic kidney disease: Secondary | ICD-10-CM

## 2023-07-17 MED ORDER — ONETOUCH VERIO FLEX SYSTEM W/DEVICE KIT: PACK | 0 refills | Status: AC

## 2023-07-17 NOTE — Patient Outreach (Signed)
Care Management  Transitions of Care Program Transitions of Care Post-discharge week 2 (pt did not answer last week)   07/17/2023 Name: Stacey Summers MRN: [id number] DOB: [birth year]  Subjective: Stacey Summers is a 77 y.o. year old female who is a primary care patient of [name], DO. The Care Management team Engaged with patient Engaged with patient by telephone to assess and address transitions of care needs.   Consent to Services:  Patient was given information about care management services, agreed to services, and gave verbal consent to participate.   Assessment: Spoke with patient and daughter who reports pt is doing well, taking all medications as prescribed, consistent with lactulose, requests prescription for new glucometer, primary care provider notified.        SDOH Interventions    Flowsheet Row Telephone from 07/04/2023 in [place] [facility] DEPARTMENT Clinical Support from 12/16/2022 in [facility] Office Visit from 11/02/2022 in [facility] Clinical Support from 02/10/2022 in [facility] Office Visit from 09/13/2021 in [facility] Clinical Support from 01/31/2020 in [facility]  SDOH Interventions        Food Insecurity Interventions Intervention Not Indicated Intervention Not Indicated -- Intervention Not Indicated -- Intervention Not Indicated  Housing Interventions Intervention Not Indicated Intervention Not Indicated -- Intervention Not Indicated -- --  Transportation Interventions Intervention Not Indicated Intervention Not Indicated -- Intervention Not Indicated -- Intervention Not Indicated  Utilities Interventions Intervention Not Indicated -- -- -- -- --  Alcohol Usage Interventions -- Intervention Not Indicated (Score <7) -- -- -- --  Depression Interventions/Treatment   -- -- Currently on Treatment Currently on Treatment Currently on Treatment --  Financial Strain Interventions -- Intervention Not Indicated -- Intervention Not Indicated -- Intervention Not Indicated  Physical Activity Interventions -- Patient Refused, Other (Comments) -- Patient Refused, Other (Comments)  [balance issues, chronic pain, weakness] -- Patient Refused  Stress Interventions -- Intervention Not Indicated -- Intervention Not Indicated -- Intervention Not Indicated  Social Connections Interventions -- Intervention Not Indicated -- Intervention Not Indicated -- Intervention Not Indicated        Goals Addressed             This Visit's Progress    Transition of Care- pt will have no readmission within 30 days       Current Barriers:  Chronic Disease Management support and education needs related to CHF  Spoke with patient and patient's daughter [name] Daughter reports pt has all medications and taking as prescribed, reports patient's glucometer is not working correctly (consistently gives incorrect readings) and would like prescription to be sent to [facility], requests another One Touch.  RNCM Clinical Goal(s):  Patient will verbalize understanding of plan for management of CHF as evidenced by patient report, review of EMR take all medications exactly as prescribed and will call provider for medication related questions as evidenced by patient report, review of EMR  through collaboration with RN Care manager, provider, and care team.   Interventions: Evaluation of current treatment plan related to  self management and patient's adherence to plan as established by provider In basket message sent to primary care provider requesting prescription for One Touch glucometer be sent to [facility] pharmacy [place]   Heart Failure Interventions:  (Status:  New goal. and Goal on track:  Yes.) Short Term Goal Discussed importance of daily weight  and advised patient to  weigh and record daily Discussed the importance of keeping all appointments with provider Reinforced HF action plan with emphasis on yellow zone Reinforced low sodium diet Reinforced importance of taking Lactulose as prescribed for management of cirrhosis Reviewed upcoming appointments   Patient Goals/Self-Care Activities: Participate in Transition of Care Program/Attend [facility] scheduled calls Notify RN Care Manager of TOC call rescheduling needs Take all medications as prescribed Attend all scheduled provider appointments Call pharmacy for medication refills 3-7 days in advance of running out of medications Call provider office for new concerns or questions  call office if I gain more than 2 pounds in one day or 5 pounds in one week watch for swelling in feet, ankles and legs every day weigh myself daily follow rescue plan if symptoms flare-up eat more whole grains, fruits and vegetables, lean meats and healthy fats know when to call the doctor:follow heart failure action plan , call doctor early on for change in health status/ symptoms Message sent to your doctor about prescription for glucometer, please check with [facility] pharmacy  Follow Up Plan:  Telephone follow up appointment with care management team member scheduled for:  07/25/23 at 2 pm          Plan: Telephone follow up appointment with care management team member scheduled for: 07/25/23 at 2 pm  Irving Shows [facility], BSN RN Care Manager/ Transition of Care [place]/ [facility] [phone]

## 2023-07-24 NOTE — Telephone Encounter (Signed)
Patient daughter called and stated that she is and her mother have not received the paperwork per the information below. Please advise.

## 2023-07-25 ENCOUNTER — Other Ambulatory Visit: Payer: Self-pay | Admitting: *Deleted

## 2023-07-25 ENCOUNTER — Telehealth: Payer: Self-pay | Admitting: *Deleted

## 2023-07-25 NOTE — Patient Outreach (Signed)
Care Management  Transitions of Care Program Transitions of Care Post-discharge week 3   07/25/2023 Name: Stacey Summers MRN: [id number] DOB: 1946-03-09  Subjective: Stacey Summers is a 77 y.o. year old female who is a primary care patient of [name], DO. The Care Management team Engaged with patient Engaged with patient by telephone to assess and address transitions of care needs.   Consent to Services:  Patient was given information about care management services, agreed to services, and gave verbal consent to participate.   Assessment: Patient reports she is doing well, has glucometer and checking CBG, continues weighing daily, has all medications and taking as prescribed, no new concerns voiced.         SDOH Interventions    Flowsheet Row Telephone from 07/04/2023 in [place] Clinical Support from 12/16/2022 in [facility] Office Visit from 11/02/2022 in [facility] Clinical Support from 02/10/2022 in [facility] Office Visit from 09/13/2021 in [facility] Clinical Support from 01/31/2020 in [facility]  SDOH Interventions        Food Insecurity Interventions Intervention Not Indicated Intervention Not Indicated -- Intervention Not Indicated -- Intervention Not Indicated  Housing Interventions Intervention Not Indicated Intervention Not Indicated -- Intervention Not Indicated -- --  Transportation Interventions Intervention Not Indicated Intervention Not Indicated -- Intervention Not Indicated -- Intervention Not Indicated  Utilities Interventions Intervention Not Indicated -- -- -- -- --  Alcohol Usage Interventions -- Intervention Not Indicated (Score <7) -- -- -- --  Depression Interventions/Treatment  -- -- Currently on Treatment Currently on Treatment Currently on  Treatment --  Financial Strain Interventions -- Intervention Not Indicated -- Intervention Not Indicated -- Intervention Not Indicated  Physical Activity Interventions -- Patient Refused, Other (Comments) -- Patient Refused, Other (Comments)  [balance issues, chronic pain, weakness] -- Patient Refused  Stress Interventions -- Intervention Not Indicated -- Intervention Not Indicated -- Intervention Not Indicated  Social Connections Interventions -- Intervention Not Indicated -- Intervention Not Indicated -- Intervention Not Indicated        Goals Addressed             This Visit's Progress    Transition of Care- pt will have no readmission within 30 days       Current Barriers:  Chronic Disease Management support and education needs related to CHF  Spoke with patient and patient's daughter [name] Daughter reports pt has all medications including lactulose and taking as prescribed, pt reports she did get new glucometer One Touch and it is working well, CBG this morning 100, weight today 214 pounds Patient reports she is attending all scheduled appointments  RNCM Clinical Goal(s):  Patient will verbalize understanding of plan for management of CHF as evidenced by patient report, review of EMR take all medications exactly as prescribed and will call provider for medication related questions as evidenced by patient report, review of EMR  through collaboration with RN Care manager, provider, and care team.   Interventions: Evaluation of current treatment plan related to  self management and patient's adherence to plan as established by provider Verified pt does have One Touch glucometer and using daily   Heart Failure Interventions:  (Status:  New goal. and Goal on track:  Yes.) Short Term Goal Discussed importance of daily weight and advised patient to weigh and record daily Discussed the importance of  keeping all appointments with provider Reviewed HF action plan with emphasis  on yellow zone Reviewed low sodium diet Reviewed importance of taking Lactulose as prescribed for management of cirrhosis Reviewed all upcoming appointments   Patient Goals/Self-Care Activities: Participate in Transition of Care Program/Attend [facility] scheduled calls Notify RN Care Manager of TOC call rescheduling needs Take all medications as prescribed Attend all scheduled provider appointments Call pharmacy for medication refills 3-7 days in advance of running out of medications Call provider office for new concerns or questions  call office if I gain more than 2 pounds in one day or 5 pounds in one week watch for swelling in feet, ankles and legs every day weigh myself daily follow rescue plan if symptoms flare-up eat more whole grains, fruits and vegetables, lean meats and healthy fats know when to call the doctor:follow heart failure action plan , call doctor early on for change in health status/ symptoms  Follow Up Plan:  Telephone follow up appointment with care management team member scheduled for:  08/02/23 at 10 am          Plan: Telephone follow up appointment with care management team member scheduled for:  08/02/23 at 10 am  Irving Shows [facility], BSN RN Care Manager/ Transition of Care [place]/ [facility] [phone]

## 2023-07-25 NOTE — Telephone Encounter (Signed)
Called [name], the daughter. No answer. Left her a message of my call. Asked that she let me know what other way she would like to get the application to her for the patient's Xifaxan. Fax, pick it up from this office or maybe she can print out the form from the [facility] patient assistance website.

## 2023-07-25 NOTE — Patient Instructions (Signed)
Visit Information  Thank you for taking time to visit with me today. Please don't hesitate to contact me if I can be of assistance to you before our next scheduled telephone appointment.  Following are the goals we discussed today:   Goals Addressed             This Visit's Progress    Transition of Care- pt will have no readmission within 30 days       Current Barriers:  Chronic Disease Management support and education needs related to CHF  Spoke with patient and patient's daughter [name] Daughter reports pt has all medications including lactulose and taking as prescribed, pt reports she did get new glucometer One Touch and it is working well, CBG this morning 100, weight today 214 pounds Patient reports she is attending all scheduled appointments  RNCM Clinical Goal(s):  Patient will verbalize understanding of plan for management of CHF as evidenced by patient report, review of EMR take all medications exactly as prescribed and will call provider for medication related questions as evidenced by patient report, review of EMR  through collaboration with RN Care manager, provider, and care team.   Interventions: Evaluation of current treatment plan related to  self management and patient's adherence to plan as established by provider Verified pt does have One Touch glucometer and using daily   Heart Failure Interventions:  (Status:  New goal. and Goal on track:  Yes.) Short Term Goal Discussed importance of daily weight and advised patient to weigh and record daily Discussed the importance of keeping all appointments with provider Reviewed HF action plan with emphasis on yellow zone Reviewed low sodium diet Reviewed importance of taking Lactulose as prescribed for management of cirrhosis Reviewed all upcoming appointments   Patient Goals/Self-Care Activities: Participate in Transition of Care Program/Attend [facility] scheduled calls Notify RN Care Manager of TOC call  rescheduling needs Take all medications as prescribed Attend all scheduled provider appointments Call pharmacy for medication refills 3-7 days in advance of running out of medications Call provider office for new concerns or questions  call office if I gain more than 2 pounds in one day or 5 pounds in one week watch for swelling in feet, ankles and legs every day weigh myself daily follow rescue plan if symptoms flare-up eat more whole grains, fruits and vegetables, lean meats and healthy fats know when to call the doctor:follow heart failure action plan , call doctor early on for change in health status/ symptoms  Follow Up Plan:  Telephone follow up appointment with care management team member scheduled for:  08/02/23 at 10 am           Our next appointment is by telephone on 08/02/23 at 10 am  Please call the care guide team at [phone] if you need to cancel or reschedule your appointment.   If you are experiencing a Mental Health or Behavioral Health Crisis or need someone to talk to, please call the Suicide and Crisis Lifeline: 988 call the [place] National Suicide Prevention Lifeline: (681)191-8931 or TTY: 825-032-2494 TTY 614-694-5251) to talk to a trained counselor call 1-800-273-TALK (toll free, 24 hour hotline) go to [facility] [address], [place] [phone]) call the [facility] Crisis Line: [phone] call 911   Patient verbalizes understanding of instructions and care plan provided today and agrees to view in MyChart. Active MyChart status and patient understanding of how to access instructions and care plan via MyChart confirmed with patient.  Irving Shows [facility], BSN RN Care Manager/ Transition of Care [place]/ [facility] [phone]

## 2023-07-25 NOTE — Patient Outreach (Signed)
  Care Management  Transitions of Care Program Transitions of Care Post-discharge week 3  07/25/2023 Name: Stacey Summers MRN: [id number] DOB: 1946-05-22  Subjective: Stacey Summers is a 77 y.o. year old female who is a primary care patient of [name], DO. The Care Management team was unable to reach the patient by phone to assess and address transitions of care needs.   Plan: Additional outreach attempts will be made to reach the patient enrolled in the [facility] Program (Post Inpatient/ED Visit).  Irving Shows [facility], BSN RN Care Manager/ Transition of Care [place]/ [facility] [phone]

## 2023-07-31 ENCOUNTER — Ambulatory Visit: Payer: PPO | Admitting: Family Medicine

## 2023-07-31 ENCOUNTER — Encounter: Payer: Self-pay | Admitting: Family Medicine

## 2023-07-31 VITALS — BP 157/66 | HR 51 | Temp 98.7°F | Ht 62.0 in | Wt 222.0 lb

## 2023-07-31 DIAGNOSIS — E1122 Type 2 diabetes mellitus with diabetic chronic kidney disease: Secondary | ICD-10-CM

## 2023-07-31 DIAGNOSIS — N1832 Chronic kidney disease, stage 3b: Secondary | ICD-10-CM | POA: Diagnosis not present

## 2023-07-31 DIAGNOSIS — E1159 Type 2 diabetes mellitus with other circulatory complications: Secondary | ICD-10-CM | POA: Diagnosis not present

## 2023-07-31 DIAGNOSIS — E1169 Type 2 diabetes mellitus with other specified complication: Secondary | ICD-10-CM

## 2023-07-31 DIAGNOSIS — K7581 Nonalcoholic steatohepatitis (NASH): Secondary | ICD-10-CM

## 2023-07-31 DIAGNOSIS — K7460 Unspecified cirrhosis of liver: Secondary | ICD-10-CM

## 2023-07-31 DIAGNOSIS — I152 Hypertension secondary to endocrine disorders: Secondary | ICD-10-CM

## 2023-07-31 DIAGNOSIS — E785 Hyperlipidemia, unspecified: Secondary | ICD-10-CM | POA: Diagnosis not present

## 2023-07-31 DIAGNOSIS — Z5309 Procedure and treatment not carried out because of other contraindication: Secondary | ICD-10-CM | POA: Diagnosis not present

## 2023-07-31 NOTE — Progress Notes (Signed)
Subjective: CC:DM PCP: [name], DO GEX:BMWUX Stacey Summers is a 77 y.o. female presenting to clinic today for:  1. Type 2 Diabetes with hypertension, hyperlipidemia with CKD 3 and CHF:  Had A1c collected in the hospital and sugar was controlled.  She remains a diet-controlled diabetic.  She is brought to the office by her daughter today.  Has an appointment tomorrow with cardiology.  She was admitted recently for sepsis and CHF.  She has had very little fluid overload now but they are really trying to balance between over diuresing her and diuresing her just enough.  She continues to limit salt.  Diabetes Health Maintenance Due  Topic Date Due   FOOT EXAM  11/25/2023   HEMOGLOBIN A1C  12/30/2023   OPHTHALMOLOGY EXAM  01/19/2024    Last A1c:  Lab Results  Component Value Date   HGBA1C 5.1 07/02/2023   ROS: Per HPI  Allergies  Allergen Reactions   Latex Other (See Comments)    "my hands feels numb"   Past Medical History:  Diagnosis Date   Allergy    seasonal   Anxiety    Arthritis    hands, back    B12 deficiency 03/31/2021   Cataract    surgery to remove   Chronic back pain    lower back    Cirrhosis of liver without ascites (HCC)    recent dx 07/11/18   Closed fracture of right tibial plateau 01/31/2018   Complex tear of medial meniscus of right knee 03/27/2018   COPD (chronic obstructive pulmonary disease) (HCC)    no inhaler   Diabetes mellitus without complication (HCC)    DJD (degenerative joint disease)    Fibromyalgia    Genetic testing 03/08/2017   Ms. Kluger underwent genetic counseling and testing for hereditary cancer syndromes on 02/21/2017. Her results were negative for mutations in all 83 genes analyzed by [facility]'s 83-gene Multi-Cancers Panel. 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   History of abnormal mammogram    History of arthroplasty of right knee 07/06/2018   Hyperlipidemia    Hypertension    Irregular heart beat    Neuromuscular disorder (HCC)    fibromyalgia   Sleep apnea    cpap some   Stress fracture of right tibia 03/27/2018   SVD (spontaneous vaginal delivery)    x 2   Tubular adenoma of colon 05/09/12   "innumerable number" of flat polyps    Current Outpatient Medications:    albuterol (VENTOLIN HFA) 108 (90 Base) MCG/ACT inhaler, Inhale 2 puffs into the lungs every 6 (six) hours as needed for wheezing or shortness of breath., Disp: 18 g, Rfl: 1   ALPRAZolam (XANAX) 0.25 MG tablet, Take 1/2-1 tablet once daily as needed for severe anxiety/ panic attack. May cause hallucinations/ falls, Disp: 30 tablet, Rfl: 0   ascorbic acid (VITAMIN C) 500 MG tablet, Take 500 mg by mouth daily., Disp: , Rfl:    Biotin 5000 MCG SUBL, Place under the tongue., Disp: , Rfl:    Blood Glucose Monitoring Suppl (ONETOUCH VERIO FLEX SYSTEM) w/Device KIT, Check Bs up to 4 times daily Dx E11.9,, Disp: 1 kit, Rfl: 0   Cholecalciferol (VITAMIN D3) 5000 units CAPS, Take 5,000 Units by mouth  daily., Disp: , Rfl:    cyclobenzaprine (FLEXERIL) 5 MG tablet, TAKE 1 TABLET BY MOUTH THREE TIMES DAILY AS NEEDED FOR MUSCLE SPASM . APPOINTMENT REQUIRED FOR FUTURE REFILLS, Disp: 30 tablet, Rfl: 0   diltiazem (CARDIZEM CD) 120 MG 24 hr capsule, Take 1 capsule (120 mg total) by mouth every Monday, Wednesday, and Friday., Disp: 45 capsule, Rfl: 2   fluticasone (FLONASE) 50 MCG/ACT nasal spray, Place 2 sprays into both nostrils daily., Disp: 16 g, Rfl: 6   furosemide (LASIX) 40 MG tablet, Take 1 tablet (40 mg total) by mouth 2 (two) times daily., Disp: 270 tablet, Rfl: 2   glucose blood (ONETOUCH VERIO) test strip, Check Bs up to 4 times daily Dx E11.9,, Disp: 400 each, Rfl: 3   hydrocortisone cream 1 %, Apply 1  Application topically 2 (two) times daily. Apply a sparingly (thin film) to facial rash twice daily for up to a week, Disp: 30 g, Rfl: 0   lactulose (CHRONULAC) 10 GM/15ML solution, TAKE 15 ML BY MOUTH  THREE TIMES DAILY AS NEEDED, Disp: 473 mL, Rfl: 1   meclizine (ANTIVERT) 25 MG tablet, TAKE 1 TABLET BY MOUTH THREE TIMES DAILY AS NEEDED FOR DIZZINESS, Disp: 90 tablet, Rfl: 1   Misc Natural Products (BEET ROOT PO), Take 1 Capful by mouth daily., Disp: , Rfl:    ondansetron (ZOFRAN-ODT) 4 MG disintegrating tablet, DISSOLVE 1 TABLET IN MOUTH EVERY 6 HOURS AS NEEDED FOR NAUSEA AND VOMITING, Disp: 20 tablet, Rfl: 0   ONETOUCH DELICA LANCETS 33G MISC, USE ONE LANCET TO CHECK GLUCOSE ONCE DAILY AND AS NEEDED, Disp: 100 each, Rfl: 0   spironolactone (ALDACTONE) 50 MG tablet, Take 0.5 tablets (25 mg total) by mouth daily., Disp: 45 tablet, Rfl: 1   XIFAXAN 550 MG TABS tablet, Take 1 tablet (550 mg total) by mouth 2 (two) times daily., Disp: 180 tablet, Rfl: 2 Social History   Socioeconomic History   Marital status: Married    Spouse name: Molly Maduro "goes by [employer]"   Number of children: 3   Years of education: 11   Highest education level: GED or equivalent  Occupational History   Occupation: retired  Tobacco Use   Smoking status: Former    Current packs/day: 0.00    Average packs/day: 1 pack/day for 3.0 years (3.0 ttl pk-yrs)    Types: Cigarettes    Start date: 08/15/1966    Quit date: 08/15/1969    Years since quitting: 53.9   Smokeless tobacco: Never  Vaping Use   Vaping status: Never Used  Substance and Sexual Activity   Alcohol use: No    Alcohol/week: 0.0 standard drinks of alcohol   Drug use: No   Sexual activity: Not Currently  Other Topics Concern   Not on file  Social History Narrative   Daughter lives with them and helps a lot   Social Drivers of Health   Financial Resource Strain: Low Risk  (12/16/2022)   Overall Financial Resource Strain (CARDIA)    Difficulty of Paying Living  Expenses: Not hard at all  Food Insecurity: No Food Insecurity (07/04/2023)   Hunger Vital Sign    Worried About Running Out of Food in the Last Year: Never true    Ran Out of Food in the Last Year: Never true  Transportation Needs: No Transportation Needs (07/04/2023)   PRAPARE - Administrator, Civil Service (Medical): No    Lack of Transportation (Non-Medical): No  Physical Activity: Inactive (12/16/2022)  Exercise Vital Sign    Days of Exercise per Week: 0 days    Minutes of Exercise per Session: 0 min  Stress: No Stress Concern Present (12/16/2022)   [employer] of Occupational Health - Occupational Stress Questionnaire    Feeling of Stress : Not at all  Social Connections: Socially Integrated (12/16/2022)   Social Connection and Isolation Panel [NHANES]    Frequency of Communication with Friends and Family: More than three times a week    Frequency of Social Gatherings with Friends and Family: More than three times a week    Attends Religious Services: More than 4 times per year    Active Member of [employer] or Organizations: Yes    Attends Engineer, structural: More than 4 times per year    Marital Status: Married  Catering manager Violence: Not At Risk (07/04/2023)   Humiliation, Afraid, Rape, and Kick questionnaire    Fear of Current or Ex-Partner: No    Emotionally Abused: No    Physically Abused: No    Sexually Abused: No   Family History  Problem Relation Age of Onset   Heart failure Mother        d.37   Heart disease Mother    Prostate cancer Father        d.89 diagnosed in late-60s   Colon cancer Father        diagnosed at 55   Leukemia Maternal Aunt    Heart failure Maternal Uncle        d.50   Stomach cancer Paternal Aunt        questionable   Colon cancer Paternal Uncle        in his 10s   Heart failure Maternal Grandmother        d.45   Kidney cancer Cousin        d.78s maternal first-cousin   Colon polyps Brother         polyposis. Underwent partial colectomy at age 29.   Spina bifida Brother        died at birth   Colon polyps Son 93   Heart attack Son 43   Other Other 16       multiple lipomas   Esophageal cancer Neg Hx    Rectal cancer Neg Hx    Breast cancer Neg Hx     Objective: Office vital signs reviewed. BP (!) 157/66   Pulse (!) 51   Temp 98.7 F (37.1 C)   Ht 5\' 2"  (1.575 m)   Wt 222 lb (100.7 kg)   SpO2 99%   BMI 40.60 kg/m   Physical Examination:  General: Awake, alert, chronically ill-appearing female, No acute distress HEENT: Sclera anicteric Cardio: regular rate and rhythm, S1S2 heard, + harsh murmurs appreciated Pulm: clear to auscultation bilaterally, no wheezes, rhonchi or rales; normal work of breathing on room air Extremities: warm, well perfused, trace edema to mid shins, No cyanosis or clubbing; +2 pulses bilaterally Neuro: hard of hearing  Assessment/ Plan: 77 y.o. female   Type 2 diabetes mellitus with stage 3b chronic kidney disease, without long-term current use of insulin (HCC)  Hyperlipidemia associated with type 2 diabetes mellitus (HCC)  Hypertension associated with diabetes (HCC)  Contraindication to statin medication  Liver cirrhosis secondary to NASH (HCC)  Continues to follow closely with Dr. Wolfgang Phoenix so I did not collect labs today.  She clinically appeared to be euvolemic with only trace fluid on the legs.  She cannot take statin  secondary to cirrhosis.  Blood pressure was above goal.  She is on diltiazem, spironolactone.  May need to consider adding something like hydralazine.  Certainly need to avoid nephrotoxic agents [name], DO [facility] [phone]

## 2023-08-01 ENCOUNTER — Ambulatory Visit: Payer: PPO | Attending: Cardiovascular Disease | Admitting: Cardiovascular Disease

## 2023-08-01 ENCOUNTER — Encounter: Payer: Self-pay | Admitting: Cardiovascular Disease

## 2023-08-01 VITALS — BP 108/62 | HR 56 | Ht 62.0 in | Wt 223.0 lb

## 2023-08-01 DIAGNOSIS — K7460 Unspecified cirrhosis of liver: Secondary | ICD-10-CM | POA: Diagnosis not present

## 2023-08-01 DIAGNOSIS — I10 Essential (primary) hypertension: Secondary | ICD-10-CM

## 2023-08-01 DIAGNOSIS — N1832 Chronic kidney disease, stage 3b: Secondary | ICD-10-CM | POA: Diagnosis not present

## 2023-08-01 DIAGNOSIS — K7581 Nonalcoholic steatohepatitis (NASH): Secondary | ICD-10-CM | POA: Diagnosis not present

## 2023-08-01 DIAGNOSIS — R002 Palpitations: Secondary | ICD-10-CM | POA: Diagnosis not present

## 2023-08-01 DIAGNOSIS — G4733 Obstructive sleep apnea (adult) (pediatric): Secondary | ICD-10-CM | POA: Diagnosis not present

## 2023-08-01 DIAGNOSIS — I5032 Chronic diastolic (congestive) heart failure: Secondary | ICD-10-CM

## 2023-08-01 DIAGNOSIS — Z862 Personal history of diseases of the blood and blood-forming organs and certain disorders involving the immune mechanism: Secondary | ICD-10-CM | POA: Diagnosis not present

## 2023-08-01 DIAGNOSIS — E7800 Pure hypercholesterolemia, unspecified: Secondary | ICD-10-CM | POA: Diagnosis not present

## 2023-08-01 NOTE — Patient Instructions (Signed)
Medication Instructions:  No changes *If you need a refill on your cardiac medications before your next appointment, please call your pharmacy*   Follow-Up: At [facility], you and your health needs are our priority.  As part of our continuing mission to provide you with exceptional heart care, we have created designated Provider Care Teams.  These Care Teams include your primary Cardiologist (physician) and Advanced Practice Providers (APPs -  Physician Assistants and Nurse Practitioners) who all work together to provide you with the care you need, when you need it.  We recommend signing up for the patient portal called "MyChart".  Sign up information is provided on this After Visit Summary.  MyChart is used to connect with patients for Virtual Visits (Telemedicine).  Patients are able to view lab/test results, encounter notes, upcoming appointments, etc.  Non-urgent messages can be sent to your provider as well.   To learn more about what you can do with MyChart, go to [email or web address].    Your next appointment:   6 month(s)  Provider:   Mihai Croitoru, MD     

## 2023-08-01 NOTE — Progress Notes (Unsigned)
Cardiology office note   Date:  08/03/2023   ID:  KIMLEY BITTLE, DOB 19-Feb-1946, MRN [id number]   PCP:  [name], DO  Cardiologist:  Thurmon Fair, MD  Electrophysiologist:  None   Evaluation Performed:  Follow-Up Visit  Chief Complaint: edema  History of Present Illness:    Stacey Summers is a 77 y.o. female with Morbid obesity, obstructive sleep apnea, essential hypertension, hyperlipidemia and chronic leg edema.  She has cirrhosis due to NASH.  She has a history of palpitations that responded reasonably well to treatment with diltiazem.    In November she had severe facial swelling, fever, shortness of breath and sepsis, associated with symptoms of encephalopathy.  She was hospitalized for about 3 days.  The exact cause of sepsis was not identified, but she may have had sinusitis and she improved with antibiotics.  She is doing well from a cardiovascular point of view.  Her edema is well-controlled.  Although it is not completely resolved, she has soft and wrinkled skin suggesting it is much better than in the past.  Her weight today is almost exactly the same as it was at her last appointment with me.  At home her weight is typically 5-6 pounds lower.  She has no abdominal distention.  She has had not had any weeping wounds in her legs in a year.  She denies dyspnea with a slight activity that she does around the house.  She has not recently complained of muscle cramps.  Seems to have a "dry weight" under 220 pounds on her home scale.  Most recent labs showed a creatinine of 1.43 which is probably her baseline, potassium of 4.7 and normal liver function tests.  During her recent hospitalization in November her BNP was 304 (the only other value have for comparison was in June 2023 when it was 208).  Her echocardiogram showed normal LV function with EF 55 to 60% and "indeterminate" diastolic parameters.  There was very mild aortic stenosis with a peak velocity of 2.5  m/s and a mean gradient of 15 mmHg.  She is taking both lactulose and rifaximin to control her encephalopathy.  Her nephrologist is Dr. Wolfgang Phoenix in [place].  She is concerned about possible "blood clots" although I am not sure why this is specifically her concern.  Her mother died suddenly without clear explanation by postmortem study at age 75.  Her brother, who had multiple health problems diabetes sleep at age 63 not long ago.  She thinks maybe they had clots.   Past Medical History:  Diagnosis Date   Allergy    seasonal   Anxiety    Arthritis    hands, back    B12 deficiency 03/31/2021   Cataract    surgery to remove   Chronic back pain    lower back    Cirrhosis of liver without ascites (HCC)    recent dx 07/11/18   Closed fracture of right tibial plateau 01/31/2018   Complex tear of medial meniscus of right knee 03/27/2018   COPD (chronic obstructive pulmonary disease) (HCC)    no inhaler   Diabetes mellitus without complication (HCC)    DJD (degenerative joint disease)    Fibromyalgia    Genetic testing 03/08/2017   Ms. College underwent genetic counseling and testing for hereditary cancer syndromes on 02/21/2017. Her results were negative for mutations in all 83 genes analyzed by [facility]'s 83-gene Multi-Cancers Panel. Genes analyzed include: ALK, APC, ATM, AXIN2, BAP1, BARD1, BLM,  BMPR1A, BRCA1, BRCA2, BRIP1, CASR, CDC73, CDH1, CDK4, CDKN1B, CDKN1C, CDKN2A, CEBPA, CHEK2, CTNNA1, DICER1, DIS3L2, EGFR, EPCAM   GERD (gastroesophageal reflux disease)    Hearing loss    bilateral - no hearing aids   History of abnormal mammogram    History of arthroplasty of right knee 07/06/2018   Hyperlipidemia    Hypertension    Irregular heart beat    Neuromuscular disorder (HCC)    fibromyalgia   Sleep apnea    cpap some   Stress fracture of right tibia 03/27/2018   SVD (spontaneous vaginal delivery)    x 2   Tubular adenoma of colon 05/09/12   "innumerable number" of flat polyps    Past Surgical History:  Procedure Laterality Date   CARPAL TUNNEL RELEASE Left 10/20/14   [facility]   CATARACT EXTRACTION, BILATERAL     CESAREAN SECTION  1977   CHOLECYSTECTOMY  1990   COLONOSCOPY     IR RADIOLOGIST EVAL & MGMT  07/16/2019   KNEE ARTHROSCOPY WITH SUBCHONDROPLASTY Right 03/27/2018   Procedure: Right knee arthroscopic partial medial meniscus repair versus menisectomy with medial tibia subchondroplasty;  Surgeon: Yolonda Kida, MD;  Location: [facility] OR;  Service: Orthopedics;  Laterality: Right;  120 mins   LARYNX SURGERY     POLYP REMOVAL   MULTIPLE TOOTH EXTRACTIONS     POLYPECTOMY     vocal cord   TONSILLECTOMY  1965   TUBAL LIGATION     TUMOR REMOVAL     benign fatty tumors on abdomen   UPPER GASTROINTESTINAL ENDOSCOPY       Current Meds  Medication Sig   albuterol (VENTOLIN HFA) 108 (90 Base) MCG/ACT inhaler Inhale 2 puffs into the lungs every 6 (six) hours as needed for wheezing or shortness of breath.   ALPRAZolam (XANAX) 0.25 MG tablet Take 1/2-1 tablet once daily as needed for severe anxiety/ panic attack. May cause hallucinations/ falls   ascorbic acid (VITAMIN C) 500 MG tablet Take 500 mg by mouth daily.   Biotin 5000 MCG SUBL Place under the tongue.   Blood Glucose Monitoring Suppl (ONETOUCH VERIO FLEX SYSTEM) w/Device KIT Check Bs up to 4 times daily Dx E11.9,   Cholecalciferol (VITAMIN D3) 5000 units CAPS Take 5,000 Units by mouth daily.   cyclobenzaprine (FLEXERIL) 5 MG tablet TAKE 1 TABLET BY MOUTH THREE TIMES DAILY AS NEEDED FOR MUSCLE SPASM . APPOINTMENT REQUIRED FOR FUTURE REFILLS   diltiazem (CARDIZEM CD) 120 MG 24 hr capsule Take 1 capsule (120 mg total) by mouth every Monday, Wednesday, and Friday.   fluticasone (FLONASE) 50 MCG/ACT nasal spray Place 2 sprays into both nostrils daily.   furosemide (LASIX) 40 MG tablet Take 1 tablet (40 mg total) by mouth 2 (two) times daily.   glucose blood (ONETOUCH VERIO) test strip Check Bs up to 4 times  daily Dx E11.9,   hydrocortisone cream 1 % Apply 1 Application topically 2 (two) times daily. Apply a sparingly (thin film) to facial rash twice daily for up to a week   meclizine (ANTIVERT) 25 MG tablet TAKE 1 TABLET BY MOUTH THREE TIMES DAILY AS NEEDED FOR DIZZINESS   Misc Natural Products (BEET ROOT PO) Take 1 Capful by mouth daily.   ondansetron (ZOFRAN-ODT) 4 MG disintegrating tablet DISSOLVE 1 TABLET IN MOUTH EVERY 6 HOURS AS NEEDED FOR NAUSEA AND VOMITING   ONETOUCH DELICA LANCETS 33G MISC USE ONE LANCET TO CHECK GLUCOSE ONCE DAILY AND AS NEEDED   spironolactone (ALDACTONE) 50 MG  tablet Take 0.5 tablets (25 mg total) by mouth daily.   XIFAXAN 550 MG TABS tablet Take 1 tablet (550 mg total) by mouth 2 (two) times daily.   [DISCONTINUED] lactulose (CHRONULAC) 10 GM/15ML solution TAKE 15 ML BY MOUTH  THREE TIMES DAILY AS NEEDED     Allergies:   Latex   Social History   Tobacco Use   Smoking status: Former    Current packs/day: 0.00    Average packs/day: 1 pack/day for 3.0 years (3.0 ttl pk-yrs)    Types: Cigarettes    Start date: 08/15/1966    Quit date: 08/15/1969    Years since quitting: 54.0   Smokeless tobacco: Never  Vaping Use   Vaping status: Never Used  Substance Use Topics   Alcohol use: No    Alcohol/week: 0.0 standard drinks of alcohol   Drug use: No     Family Hx: The patient's family history includes Colon cancer in her father and paternal uncle; Colon polyps in her brother; Colon polyps (age of onset: 82) in her son; Heart attack (age of onset: 48) in her son; Heart disease in her mother; Heart failure in her maternal grandmother, maternal uncle, and mother; Kidney cancer in her cousin; Leukemia in her maternal aunt; Other (age of onset: 47) in an other family member; Prostate cancer in her father; Spina bifida in her brother; Stomach cancer in her paternal aunt. There is no history of Esophageal cancer, Rectal cancer, or Breast cancer.  ROS:   Please see the history  of present illness.    All other systems are reviewed and are negative. Prior CV studies:   The following studies were reviewed today:  Echocardiogram 07/02/2023 1. Left ventricular ejection fraction, by estimation, is 55 to 60%. The  left ventricle has normal function. The left ventricle has no regional  wall motion abnormalities. There is mild concentric left ventricular  hypertrophy. Left ventricular diastolic  parameters are indeterminate.Images are foreshortened.   2. Right ventricular systolic function is normal. The right ventricular  size is normal. There is normal pulmonary artery systolic pressure.   3. Left atrial size was severely dilated.   4. The mitral valve is normal in structure. Trivial mitral valve  regurgitation. No evidence of mitral stenosis.   5. The aortic valve is tricuspid. Aortic valve regurgitation is not  visualized. Very mild aortic valve stenosis with peak velocity 2.5 m/s.  Aortic valve mean gradient measures 15.0 mmHg.   Comparison(s): No significant change from prior study. Prior images  reviewed side by side.   Labs/Other Tests and Data Reviewed:    EKG:   EKG Interpretation Date/Time:  Tuesday August 01 2023 16:13:38 EST Ventricular Rate:  56 PR Interval:  176 QRS Duration:  78 QT Interval:  460 QTC Calculation: 443 R Axis:   15  Text Interpretation: Unusual P axis, possible ectopic atrial bradycardia followed by sinus rhythm When compared with ECG of 03-Jul-2023 10:19, Ectopic atrial rhythm  is alternating with Sinus rhythm Confirmed by Miliani Deike [phone]) on 08/01/2023 4:29:23 PM         07/03/2023: ALT 19; Hemoglobin 12.0; Platelets 69 07/04/2023: BNP 304.0; BUN 55; Creatinine, Ser 1.43; Potassium 4.7; Sodium 139   Recent Labs:   Recent Lipid Panel Lipid Panel     Component Value Date/Time   CHOL 103 06/28/2021 1106   CHOL 162 12/20/2012 1044   TRIG 55 06/28/2021 1106   TRIG 91 12/25/2014 1020   TRIG 83 12/20/2012  1044  HDL 48 06/28/2021 1106   HDL 42 12/25/2014 1020   HDL 42 12/20/2012 1044   CHOLHDL 2.1 06/28/2021 1106   LDLCALC 42 06/28/2021 1106   LDLCALC 59 01/29/2014 1031   LDLCALC 103 (H) 12/20/2012 1044   LABVLDL 13 06/28/2021 1106    Wt Readings from Last 3 Encounters:  08/01/23 223 lb (101.2 kg)  07/31/23 222 lb (100.7 kg)  07/03/23 232 lb 9.4 oz (105.5 kg)     Objective:    Vital Signs:  BP 108/62   Pulse (!) 56   Ht 5\' 2"  (1.575 m)   Wt 223 lb (101.2 kg)   SpO2 96%   BMI 40.79 kg/m     General: Alert, oriented x3, no distress, morbidly obese Head: no evidence of trauma, PERRL, EOMI, no exophtalmos or lid lag, no myxedema, no xanthelasma; normal ears, nose and oropharynx Neck: normal jugular venous pulsations and no hepatojugular reflux; brisk carotid pulses without delay and no carotid bruits Chest: clear to auscultation, no signs of consolidation by percussion or palpation, normal fremitus, symmetrical and full respiratory excursions Cardiovascular: normal position and quality of the apical impulse, regular rhythm, normal first and second heart sounds, very faint aortic ejection murmur, no murmurs, rubs or gallops Abdomen: no tenderness or distention, no masses by palpation, no abnormal pulsatility or arterial bruits, normal bowel sounds, no hepatosplenomegaly Extremities: Chronic 2-3+ edema of the feet ankles and calves bilaterally; moderate to severe lymphedema of the upper extremities is also present Neurological: grossly nonfocal Psych: Normal mood and affect    ASSESSMENT & PLAN:    CHF: She appears to be well compensated.  Although she has some evidence of hypervolemia (leg edema (this is likely due to lymphedema and cirrhosis, not heart failure).  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   I believe the cirrhosis is the biggest contributor.  I think we have reached a reasonable compromise between hypervolemia, which was causing an unhealthy amount of edema with skin compromise and excessive diuresis (only a slight increase in creatinine level and no evidence of worsening encephalopathy).  Optimal weight seems to be around 220 pounds on her home scale, 223 pounds on our office scale. Cirrhosis due to NASH: Her albumin has dropped a little lower again, now is 2.5.  Platelet count is essentially unchanged around 70K.  Suspect portal hypertension with splenic sequestration.  Encephalopathy is well-controlled on a combination of lactulose and rifaximin.  Adding the rifaximin made a big difference. CKD: Stable renal parameters.  24-hour urine collection showed creatinine clearance of 63 mL/min.  After adding spironolactone her edema improved substantially and she no longer has abdominal distention, but her creatinine has increased to a typical baseline around 1.5 (most recently 1.43), which corresponds to an estimated GFR around 40. OSA: Recommend using CPAP when she sleeps whether it is daytime or nighttime.. Palpitations: Very well-controlled on low-dose diltiazem.  Appear to be largely due to PVCs. HTN: A little high when she first presented to the office, but much better later on.  No changes recommended. HLP: all lipid parameters are in desirable range.  No longer has diabetes mellitus, likely due to progression of liver disease and some true weight loss. Morbid obesity: Likely the major underlying cause for her dyslipidemia, hypertension, sleep apnea and NASH.  Real weight would be highly beneficial. History of anemia: Most recent hemoglobin 12  with mildly macrocytic indices.  Resolved.   Patient Instructions  Medication Instructions:  No changes *If you need a refill on your cardiac medications before your next appointment, please call your pharmacy*  Follow-Up: At [facility], you and your health needs are our priority.  As part of our continuing mission to provide you with exceptional heart care, we have created designated Provider Care Teams.  These Care Teams include your primary Cardiologist (physician) and Advanced Practice Providers (APPs -  Physician Assistants and Nurse Practitioners) who all work together to provide you with the care you need, when you need it.  We recommend signing up for the patient portal called "MyChart".  Sign up information is provided on this After Visit Summary.  MyChart is used to connect with patients for Virtual Visits (Telemedicine).  Patients are able to view lab/test results, encounter notes, upcoming appointments, etc.  Non-urgent messages can be sent to your provider as well.   To learn more about what you can do with MyChart, go to [email or web address].    Your next appointment:   6 month(s)  Provider:   Thurmon Fair, MD            Signed, Thurmon Fair, MD  08/03/2023 6:59 PM    [facility]

## 2023-08-02 ENCOUNTER — Other Ambulatory Visit: Payer: Self-pay | Admitting: *Deleted

## 2023-08-02 ENCOUNTER — Other Ambulatory Visit: Payer: Self-pay | Admitting: Gastroenterology

## 2023-08-02 NOTE — Patient Outreach (Signed)
Care Management  Transitions of Care Program Transitions of Care Post-discharge week 4   08/02/2023 Name: Stacey Summers MRN: [id number] DOB: 1946-06-03  Subjective: Stacey Summers is a 77 y.o. year old female who is a primary care patient of [name], DO. The Care Management team Engaged with patient Engaged with patient by telephone to assess and address transitions of care needs.   Consent to Services:  Pt previously consented to program  Assessment: Patient reports she followed up with primary care provider on 12/16 and cardiology on 12/17, states " I'm doing well", taking lactulose and all medications as prescribed, continues weighing daily and checking CBG daily, no new concerns reported.         SDOH Interventions    Flowsheet Row Telephone from 07/04/2023 in [place] [facility] Clinical Support from 12/16/2022 in [facility] Office Visit from 11/02/2022 in [facility] Clinical Support from 02/10/2022 in [facility] Office Visit from 09/13/2021 in [facility] Clinical Support from 01/31/2020 in [facility]  SDOH Interventions        Food Insecurity Interventions Intervention Not Indicated Intervention Not Indicated -- Intervention Not Indicated -- Intervention Not Indicated  Housing Interventions Intervention Not Indicated Intervention Not Indicated -- Intervention Not Indicated -- --  Transportation Interventions Intervention Not Indicated Intervention Not Indicated -- Intervention Not Indicated -- Intervention Not Indicated  Utilities Interventions Intervention Not Indicated -- -- -- -- --  Alcohol Usage Interventions -- Intervention Not Indicated (Score <7) -- -- -- --  Depression Interventions/Treatment  -- -- Currently on Treatment Currently on Treatment Currently on  Treatment --  Financial Strain Interventions -- Intervention Not Indicated -- Intervention Not Indicated -- Intervention Not Indicated  Physical Activity Interventions -- Patient Refused, Other (Comments) -- Patient Refused, Other (Comments)  [balance issues, chronic pain, weakness] -- Patient Refused  Stress Interventions -- Intervention Not Indicated -- Intervention Not Indicated -- Intervention Not Indicated  Social Connections Interventions -- Intervention Not Indicated -- Intervention Not Indicated -- Intervention Not Indicated        Goals Addressed             This Visit's Progress    COMPLETED: Transition of Care- pt will have no readmission within 30 days       Current Barriers:  Chronic Disease Management support and education needs related to CHF  Spoke with patient and patient's daughter [name] Patient reports she has all medications including lactulose and taking as prescribed, pt reports she did get new glucometer One Touch and it is working well, CBG "is good", weight today 213 pounds Patient reports she is attending all scheduled appointments, saw primary care provider on 12/16 and cardiologist on 12/17 Patient reports she is elevating legs throughout the day  RNCM Clinical Goal(s):  Patient will verbalize understanding of plan for management of CHF as evidenced by patient report, review of EMR take all medications exactly as prescribed and will call provider for medication related questions as evidenced by patient report, review of EMR  through collaboration with RN Care manager, provider, and care team.   Interventions: Evaluation of current treatment plan related to  self management and patient's adherence to plan as established by provider Verified pt does have One Touch glucometer and using daily Reviewed plan of care including TOC case closure   Heart Failure Interventions:  (Status:  New  goal. and Goal on track:  Yes.) Short Term Goal Discussed  importance of daily weight and advised patient to weigh and record daily Discussed the importance of keeping all appointments with provider Reinforced HF action plan with emphasis on yellow zone Reinforced low sodium diet Reinforced importance of taking Lactulose as prescribed for management of cirrhosis Reviewed all upcoming appointments   Patient Goals/Self-Care Activities: Participate in Transition of Care Program/Attend TOC scheduled calls Notify RN Care Manager of TOC call rescheduling needs Take all medications as prescribed Attend all scheduled provider appointments Call pharmacy for medication refills 3-7 days in advance of running out of medications Call provider office for new concerns or questions  call office if I gain more than 2 pounds in one day or 5 pounds in one week watch for swelling in feet, ankles and legs every day weigh myself daily follow rescue plan if symptoms flare-up eat more whole grains, fruits and vegetables, lean meats and healthy fats know when to call the doctor:follow heart failure action plan , call doctor early on for change in health status/ symptoms Continue weighing daily and keeping a log Continue checking blood sugar and keeping a log Case closure  Follow Up Plan:  No further follow up required: TOC case closure          Plan: The patient has been provided with contact information for the care management team and has been advised to call with any health related questions or concerns.   Irving Shows [facility], BSN RN Care Manager/ Transition of Care [place]/ [facility] [phone]

## 2023-08-02 NOTE — Patient Instructions (Signed)
Visit Information  Thank you for taking time to visit with me today. Please don't hesitate to contact me if I can be of assistance to you before our next scheduled telephone appointment.  Following are the goals we discussed today:  Goals Addressed             This Visit's Progress    COMPLETED: Transition of Care- pt will have no readmission within 30 days       Current Barriers:  Chronic Disease Management support and education needs related to CHF  Spoke with patient and patient's daughter Stacey Summers Patient reports she has all medications including lactulose and taking as prescribed, pt reports she did get new glucometer One Touch and it is working well, CBG "is good", weight today 213 pounds Patient reports she is attending all scheduled appointments, saw primary care provider on 12/16 and cardiologist on 12/17 Patient reports she is elevating legs throughout the day  RNCM Clinical Goal(s):  Patient will verbalize understanding of plan for management of CHF as evidenced by patient report, review of EMR take all medications exactly as prescribed and will call provider for medication related questions as evidenced by patient report, review of EMR  through collaboration with RN Care manager, provider, and care team.   Interventions: Evaluation of current treatment plan related to  self management and patient's adherence to plan as established by provider Verified pt does have One Touch glucometer and using daily Reviewed plan of care including TOC case closure   Heart Failure Interventions:  (Status:  New goal. and Goal on track:  Yes.) Short Term Goal Discussed importance of daily weight and advised patient to weigh and record daily Discussed the importance of keeping all appointments with provider Reinforced HF action plan with emphasis on yellow zone Reinforced low sodium diet Reinforced importance of taking Lactulose as prescribed for management of cirrhosis Reviewed all  upcoming appointments   Patient Goals/Self-Care Activities: Participate in Transition of Care Program/Attend [facility] scheduled calls Notify RN Care Manager of TOC call rescheduling needs Take all medications as prescribed Attend all scheduled provider appointments Call pharmacy for medication refills 3-7 days in advance of running out of medications Call provider office for new concerns or questions  call office if I gain more than 2 pounds in one day or 5 pounds in one week watch for swelling in feet, ankles and legs every day weigh myself daily follow rescue plan if symptoms flare-up eat more whole grains, fruits and vegetables, lean meats and healthy fats know when to call the doctor:follow heart failure action plan , call doctor early on for change in health status/ symptoms Continue weighing daily and keeping a log Continue checking blood sugar and keeping a log Case closure  Follow Up Plan:  No further follow up required: TOC case closure           Please call the care guide team at [phone] if you need to cancel or reschedule your appointment.   If you are experiencing a Mental Health or Behavioral Health Crisis or need someone to talk to, please call the Suicide and Crisis Lifeline: 988 call the [place] National Suicide Prevention Lifeline: 2015543093 or TTY: 636-477-0440 TTY 253-619-3308) to talk to a trained counselor call 1-800-273-TALK (toll free, 24 hour hotline) go to [facility] [address], [place] [phone]) call the [facility] Crisis Line: [phone])[phone] call 911   Patient verbalizes understanding of instructions and care plan provided today and agrees  to view in MyChart. Active MyChart status and patient understanding of how to access instructions and care plan via MyChart confirmed with patient.     Stacey Summers [facility], BSN RN Care Manager/ Transition of Care [place]/ [facility] [phone]

## 2023-08-17 ENCOUNTER — Telehealth: Payer: Self-pay | Admitting: *Deleted

## 2023-08-17 NOTE — Telephone Encounter (Signed)
 I have completed patients PAP forms for her Stacey Summers waiting for Dr Lavon Paganini to sign

## 2023-08-22 ENCOUNTER — Inpatient Hospital Stay: Payer: PPO

## 2023-08-22 ENCOUNTER — Inpatient Hospital Stay: Payer: PPO | Attending: Hematology

## 2023-08-22 VITALS — BP 152/62 | HR 44 | Temp 97.6°F | Resp 18

## 2023-08-22 DIAGNOSIS — D509 Iron deficiency anemia, unspecified: Secondary | ICD-10-CM | POA: Insufficient documentation

## 2023-08-22 DIAGNOSIS — Z79899 Other long term (current) drug therapy: Secondary | ICD-10-CM | POA: Insufficient documentation

## 2023-08-22 DIAGNOSIS — E538 Deficiency of other specified B group vitamins: Secondary | ICD-10-CM

## 2023-08-22 DIAGNOSIS — E611 Iron deficiency: Secondary | ICD-10-CM

## 2023-08-22 DIAGNOSIS — D696 Thrombocytopenia, unspecified: Secondary | ICD-10-CM

## 2023-08-22 LAB — CBC WITH DIFFERENTIAL/PLATELET
Abs Immature Granulocytes: 0.01 10*3/uL (ref 0.00–0.07)
Basophils Absolute: 0 10*3/uL (ref 0.0–0.1)
Basophils Relative: 1 %
Eosinophils Absolute: 0.1 10*3/uL (ref 0.0–0.5)
Eosinophils Relative: 2 %
HCT: 38.2 % (ref 36.0–46.0)
Hemoglobin: 12.5 g/dL (ref 12.0–15.0)
Immature Granulocytes: 0 %
Lymphocytes Relative: 36 %
Lymphs Abs: 2.1 10*3/uL (ref 0.7–4.0)
MCH: 33.4 pg (ref 26.0–34.0)
MCHC: 32.7 g/dL (ref 30.0–36.0)
MCV: 102.1 fL — ABNORMAL HIGH (ref 80.0–100.0)
Monocytes Absolute: 0.5 10*3/uL (ref 0.1–1.0)
Monocytes Relative: 9 %
Neutro Abs: 3 10*3/uL (ref 1.7–7.7)
Neutrophils Relative %: 52 %
Platelets: 97 10*3/uL — ABNORMAL LOW (ref 150–400)
RBC: 3.74 MIL/uL — ABNORMAL LOW (ref 3.87–5.11)
RDW: 13.4 % (ref 11.5–15.5)
Smear Review: DECREASED
WBC: 5.7 10*3/uL (ref 4.0–10.5)
nRBC: 0 % (ref 0.0–0.2)

## 2023-08-22 LAB — COMPREHENSIVE METABOLIC PANEL
ALT: 22 U/L (ref 0–44)
AST: 47 U/L — ABNORMAL HIGH (ref 15–41)
Albumin: 2.8 g/dL — ABNORMAL LOW (ref 3.5–5.0)
Alkaline Phosphatase: 80 U/L (ref 38–126)
Anion gap: 5 (ref 5–15)
BUN: 31 mg/dL — ABNORMAL HIGH (ref 8–23)
CO2: 31 mmol/L (ref 22–32)
Calcium: 9.4 mg/dL (ref 8.9–10.3)
Chloride: 101 mmol/L (ref 98–111)
Creatinine, Ser: 1 mg/dL (ref 0.44–1.00)
GFR, Estimated: 59 mL/min — ABNORMAL LOW (ref >60)
Glucose, Bld: 152 mg/dL — ABNORMAL HIGH (ref 70–99)
Potassium: 3.6 mmol/L (ref 3.5–5.1)
Sodium: 137 mmol/L (ref 135–145)
Total Bilirubin: 4 mg/dL — ABNORMAL HIGH (ref 0.0–1.2)
Total Protein: 5.6 g/dL — ABNORMAL LOW (ref 6.5–8.1)

## 2023-08-22 LAB — IRON AND TIBC
Iron: 226 ug/dL — ABNORMAL HIGH (ref 28–170)
Saturation Ratios: 81 % — ABNORMAL HIGH (ref 10.4–31.8)
TIBC: 281 ug/dL (ref 250–450)
UIBC: 55 ug/dL

## 2023-08-22 LAB — FERRITIN: Ferritin: 88 ng/mL (ref 11–307)

## 2023-08-22 LAB — VITAMIN B12: Vitamin B-12: 982 pg/mL — ABNORMAL HIGH (ref 180–914)

## 2023-08-22 MED ORDER — CYANOCOBALAMIN 1000 MCG/ML IJ SOLN
1000.0000 ug | Freq: Once | INTRAMUSCULAR | Status: AC
  Administered 2023-08-22: 1000 ug via INTRAMUSCULAR
  Filled 2023-08-22: qty 1

## 2023-08-22 NOTE — Patient Instructions (Signed)
 [facility] [place] - A DEPT OF [facility]  Discharge Instructions: Thank you for choosing [facility] to provide your oncology and hematology care.  If you have a lab appointment with the Cancer Center - please note that after April 8th, 2024, all labs will be drawn in the cancer center.  You do not have to check in or register with the main entrance as you have in the past but will complete your check-in in the cancer center.  Wear comfortable clothing and clothing appropriate for easy access to any Portacath or PICC line.   We strive to give you quality time with your provider. You may need to reschedule your appointment if you arrive late (15 or more minutes).  Arriving late affects you and other patients whose appointments are after yours.  Also, if you miss three or more appointments without notifying the office, you may be dismissed from the clinic at the provider's discretion.      For prescription refill requests, have your pharmacy contact our office and allow 72 hours for refills to be completed.    Today you received the following B12 injection, return as scheduled.   To help prevent nausea and vomiting after your treatment, we encourage you to take your nausea medication as directed.  BELOW ARE SYMPTOMS THAT SHOULD BE REPORTED IMMEDIATELY: *FEVER GREATER THAN 100.4 F (38 C) OR HIGHER *CHILLS OR SWEATING *NAUSEA AND VOMITING THAT IS NOT CONTROLLED WITH YOUR NAUSEA MEDICATION *UNUSUAL SHORTNESS OF BREATH *UNUSUAL BRUISING OR BLEEDING *URINARY PROBLEMS (pain or burning when urinating, or frequent urination) *BOWEL PROBLEMS (unusual diarrhea, constipation, pain near the anus) TENDERNESS IN MOUTH AND THROAT WITH OR WITHOUT PRESENCE OF ULCERS (sore throat, sores in mouth, or a toothache) UNUSUAL RASH, SWELLING OR PAIN  UNUSUAL VAGINAL DISCHARGE OR ITCHING   Items with * indicate a potential emergency and should be followed up as soon as  possible or go to the Emergency Department if any problems should occur.  Please show the CHEMOTHERAPY ALERT CARD or IMMUNOTHERAPY ALERT CARD at check-in to the Emergency Department and triage nurse.  Should you have questions after your visit or need to cancel or reschedule your appointment, please contact [facility] [place] - A DEPT OF Eligha Brideg[facility] [phone]  and follow the prompts.  Office hours are 8:00 a.m. to 4:30 p.m. Monday - Friday. Please note that voicemails left after 4:00 p.m. may not be returned until the following business day.  We are closed weekends and major holidays. You have access to a nurse at all times for urgent questions. Please call the main number to the clinic [phone] and follow the prompts.  For any non-urgent questions, you may also contact your provider using MyChart. We now offer e-Visits for anyone 69 and older to request care online for non-urgent symptoms. For details visit [email or web address].   Also download the MyChart app! Go to the app store, search "MyChart", open the app, select [place], and log in with your MyChart username and password.

## 2023-08-22 NOTE — Progress Notes (Signed)
 Port flushed with good blood return noted. No bruising or swelling at site. Bandaid applied and patient discharged in satisfactory condition. VVS stable with no signs or symptoms of distressed noted.

## 2023-08-24 LAB — METHYLMALONIC ACID, SERUM: Methylmalonic Acid, Quantitative: 359 nmol/L (ref 0–378)

## 2023-08-28 ENCOUNTER — Inpatient Hospital Stay: Payer: PPO | Admitting: Physician Assistant

## 2023-08-28 NOTE — Progress Notes (Signed)
 VIRTUAL VISIT via TELEPHONE NOTE [facility]   I connected with Stacey Summers  on 08/29/2023 at 11:30 AM by telephone and verified that I am speaking with the correct person using two identifiers.  Location: Patient: Home Provider: [facility]   I discussed the limitations, risks, security and privacy concerns of performing an evaluation and management service by telephone and the availability of in person appointments. I also discussed with the patient that there may be a patient responsible charge related to this service. The patient expressed understanding and agreed to proceed.  REASON FOR VISIT:  Follow-up for thrombocytopenia (in the setting of liver cirrhosis) + B12 deficiency + iron deficiency without anemia   PRIOR THERAPY: None   CURRENT THERAPY: Vitamin B12 injections every 6 weeks + oral iron supplement  INTERVAL HISTORY:   Stacey Summers 78 y.o. Summers returns for routine follow-up of her thrombocytopenia, B12 deficiency, iron deficiency, and lung nodules.  She was last seen by Pleasant Barefoot PA-C on 03/06/2023.   At today's visit, she reports feeling fair.  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   She denies noticing any bleeding events such as hematemesis, hematochezia, epistaxis, melena, or hematuria.   She does have some mild easy bruising on her extremities, but denies any petechial rash.  No B symptoms such as fever, chills, night sweats, unintentional weight loss.   She continues to have chronic fatigue related to her multiple medical comorbidities.  She reports compliance with vitamin B12 injections every 6 weeks  She has 50% energy and 100% appetite. She endorses that she is maintaining a stable weight.  ASSESSMENT & PLAN:  1.  Thrombocytopenia: - Platelet count is ranging anywhere between 70-140.   Most recent platelets (08/22/2023) are 97, at baseline. - CT of the AP on 06/27/2018 showed spleen is mildly enlarged.  She has cirrhosis of the liver. - MRI abdomen (06/29/2022): Spleen normal in size without significant abnormality - EGD on 08/20/2019 showed grade 1 varices, but patient denies any melena or bright red blood per rectum - No signs or symptoms of bleeding at this time.   Admits to easy bruising but denies petechial rash. - Differential diagnosis favors thrombocytopenia secondary to splenomegaly and cirrhosis. - PLAN: No treatment of thrombocytopenia needed at this time.  RTC for labs and follow-up visit in 6 months.  - If any significant changes from baseline, would consider bone marrow biopsy to rule out co-existent marrow disorder    2.  Iron deficiency (without anemia), RESOLVED - Iron panel (07/28/2021): Ferritin 35, iron saturation 44% - Patient denies any signs of GI blood loss - Most recent EGD (08/20/2019) showed small esophageal varices and mild portal hypertensive gastropathy - She took iron tablets since December 2022, stopped in February 2024 due to elevated iron levels. - Most recent labs (08/22/2023): Hgb 12.5/MCV 102.1, ferritin 88, iron saturation 81% - PLAN: No indication to restart iron pill at this time.  We will recheck iron panel at follow-up visit in 6 months.  3.  B12 deficiency - B12 deficiency evidenced by labs (03/26/2021) showing elevated methylmalonic acid 629, although B12 was normal at 336 - Currently receiving vitamin B12 injections every 6 weeks  - Most recent labs (08/22/2023): Vitamin B12 982, MMA normal  - PLAN: Continue B12 injections every 6 weeks   4.  Lung nodules: - She is a former  smoker and has not smoked cigarettes since 1971. - CT of the chest from 10/04/2019 which showed resolution of recent COVID-19 pneumonia.  Bilateral pulmonary nodules, less than 4 mm in size have been stable compared to previous CT chest 06/28/2019 -Repeat CT chest  (07/28/2021): Bilateral small subpleural nodules are stable and benign, largest nodule in the peripheral left lower lobe measures 3.5 mm, unchanged. - PLAN: No further follow-up recommended per radiology report  PLAN SUMMARY: >> B12 injections every 6 weeks >> Labs (CBC/D, CMP, B12, MMA, ferritin, iron/TIBC) in 6 months >> RTC in 6 months (1 week after labs) for OFFICE visit     REVIEW OF SYSTEMS:   Review of Systems  Constitutional:  Positive for malaise/fatigue. Negative for chills, diaphoresis, fever and weight loss.  Respiratory:  Positive for shortness of breath. Negative for cough.   Cardiovascular:  Negative for chest pain and palpitations.  Gastrointestinal:  Positive for nausea. Negative for abdominal pain, blood in stool, melena and vomiting.  Genitourinary:  Positive for frequency.  Musculoskeletal:  Positive for back pain.  Neurological:  Positive for headaches. Negative for dizziness.  Psychiatric/Behavioral:  The patient has insomnia.      PHYSICAL EXAM: (per limitations of virtual telephone visit)  The patient is alert and oriented x 3, exhibiting adequate mentation, good mood, and ability to speak in full sentences and execute sound judgement.  WRAP UP:   I discussed the assessment and treatment plan with the patient. The patient was provided an opportunity to ask questions and all were answered. The patient agreed with the plan and demonstrated an understanding of the instructions.   The patient was advised to call back or seek an in-person evaluation if the symptoms worsen or if the condition fails to improve as anticipated.  I provided 22 minutes of non-face-to-face time during this encounter, with > 10 minutes of medical discussion.  Pleasant CHRISTELLA Barefoot, PA-C 08/29/23 11:53 AM

## 2023-08-29 ENCOUNTER — Other Ambulatory Visit: Payer: Self-pay | Admitting: Gastroenterology

## 2023-08-29 ENCOUNTER — Encounter: Payer: Self-pay | Admitting: Physician Assistant

## 2023-08-29 ENCOUNTER — Inpatient Hospital Stay: Payer: PPO | Admitting: Physician Assistant

## 2023-08-29 VITALS — Wt 228.0 lb

## 2023-08-29 DIAGNOSIS — D696 Thrombocytopenia, unspecified: Secondary | ICD-10-CM | POA: Diagnosis not present

## 2023-08-29 DIAGNOSIS — E611 Iron deficiency: Secondary | ICD-10-CM

## 2023-08-29 DIAGNOSIS — E538 Deficiency of other specified B group vitamins: Secondary | ICD-10-CM | POA: Diagnosis not present

## 2023-08-30 NOTE — Telephone Encounter (Signed)
 I faxed completed BA[employer] forms for the patients [employer] today to [phone]

## 2023-09-04 ENCOUNTER — Encounter: Payer: Self-pay | Admitting: Nurse Practitioner

## 2023-09-04 ENCOUNTER — Ambulatory Visit: Payer: Self-pay | Admitting: Family Medicine

## 2023-09-04 ENCOUNTER — Telehealth (INDEPENDENT_AMBULATORY_CARE_PROVIDER_SITE_OTHER): Payer: PPO | Admitting: Nurse Practitioner

## 2023-09-04 DIAGNOSIS — K7581 Nonalcoholic steatohepatitis (NASH): Secondary | ICD-10-CM

## 2023-09-04 DIAGNOSIS — R5081 Fever presenting with conditions classified elsewhere: Secondary | ICD-10-CM

## 2023-09-04 DIAGNOSIS — R051 Acute cough: Secondary | ICD-10-CM | POA: Diagnosis not present

## 2023-09-04 DIAGNOSIS — K7460 Unspecified cirrhosis of liver: Secondary | ICD-10-CM | POA: Diagnosis not present

## 2023-09-04 DIAGNOSIS — R17 Unspecified jaundice: Secondary | ICD-10-CM | POA: Diagnosis not present

## 2023-09-04 DIAGNOSIS — R509 Fever, unspecified: Secondary | ICD-10-CM | POA: Insufficient documentation

## 2023-09-04 LAB — CBC WITH DIFFERENTIAL/PLATELET
Basophils Absolute: 0 10*3/uL (ref 0.0–0.2)
Basos: 1 %
EOS (ABSOLUTE): 0 10*3/uL (ref 0.0–0.4)
Eos: 0 %
Hematocrit: 35.7 % (ref 34.0–46.6)
Hemoglobin: 12.6 g/dL (ref 11.1–15.9)
Immature Grans (Abs): 0 10*3/uL (ref 0.0–0.1)
Immature Granulocytes: 0 %
Lymphocytes Absolute: 0.7 10*3/uL (ref 0.7–3.1)
Lymphs: 16 %
MCH: 35.1 pg — ABNORMAL HIGH (ref 26.6–33.0)
MCHC: 35.3 g/dL (ref 31.5–35.7)
MCV: 99 fL — ABNORMAL HIGH (ref 79–97)
Monocytes Absolute: 0.8 10*3/uL (ref 0.1–0.9)
Monocytes: 18 %
Neutrophils Absolute: 2.8 10*3/uL (ref 1.4–7.0)
Neutrophils: 65 %
Platelets: 92 10*3/uL — CL (ref 150–450)
RBC: 3.59 x10E6/uL — ABNORMAL LOW (ref 3.77–5.28)
RDW: 12.4 % (ref 11.7–15.4)
WBC: 4.4 10*3/uL (ref 3.4–10.8)

## 2023-09-04 LAB — AMMONIA: Ammonia: 69 ug/dL (ref 31–169)

## 2023-09-04 MED ORDER — AZITHROMYCIN 250 MG PO TABS: ORAL_TABLET | ORAL | 0 refills | Status: DC

## 2023-09-04 NOTE — Telephone Encounter (Signed)
Copied from CRM [id number]. Topic: Clinical - Medical Advice >> Sep 04, 2023 10:49 AM Jorje Guild R wrote: Reason for CRM: Patient daughter wants to see if she can get her mom a phone visit instead of coming in because her mother which is the patient is very ill. Wants a nurse to call as soon as possible.    Chief Complaint: Family requesting virtual or phone visit   Additional Notes: Patient schedule for OV today at 2:15pm, family asking if they can do virtual or phone visit instead due to patient being very ill, spoke with CAL and confirmed it was okay. RN was advised that patient would get a call "between now and 5pm". Same information provided to patient's daughter, verbalized understanding. No questions at this time.   Reason for Disposition  [1] Follow-up call from patient regarding patient's clinical status AND [2] information NON-URGENT  Answer Assessment - Initial Assessment Questions 1. REASON FOR CALL or QUESTION: "What is your reason for calling today?" or "How can I best help you?" or "What question do you have that I can help answer?"     Patient's daughter asking if patient appt can be done virtually due to patient very ill today  2. CALLER: Document the source of call. (e.g., laboratory, patient).     Patient's daughter, [name]  Protocols used: PCP Call - No Triage-A-AH

## 2023-09-04 NOTE — Progress Notes (Unsigned)
Virtual Visit via video Note Due to COVID-19 pandemic this visit was conducted virtually. This visit type was conducted due to national recommendations for restrictions regarding the COVID-19 Pandemic (e.g. social distancing, sheltering in place) in an effort to limit this patient's exposure and mitigate transmission in our community. All issues noted in this document were discussed and addressed.  A physical exam was not performed with this format.   I connected with Stacey Summers on 09/04/2023 at 1340 by Name and DOB and verified that I am speaking with the correct person using two identifiers. Stacey Summers is currently located at home with her  daughter  is currently with them during visit. The provider, Martina Sinner, DNP is located in their office at time of visit.  I discussed the limitations, risks, security and privacy concerns of performing an evaluation and management service by virtual visit and the availability of in person appointments. I also discussed with the patient that there may be a patient responsible charge related to this service. The patient expressed understanding and agreed to proceed.  Subjective:  Patient ID: Stacey Summers, female    DOB: 01/14/1946, 78 y.o.   MRN: [id number]  Chief Complaint:  Cough (" Fever since yesterday 102 and non productive since Saturday  and jaundice")   HPI: Stacey Summers is a 78 y.o. 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 Clinet appears very jaundice. Daughter wants her to get an ATB, however the writer is more concerns about ammonia level. STA Amonia level is ordered, daughter will bring client for labs and will call her with the results   Relevant past medical, surgical, family, and  social history reviewed and updated as indicated.  Allergies and medications reviewed and updated.   Past Medical History:  Diagnosis Date   Allergy    seasonal   Anxiety    Arthritis    hands, back    B12 deficiency 03/31/2021   Cataract    surgery to remove   Chronic back pain    lower back    Cirrhosis of liver without ascites (HCC)    recent dx 07/11/18   Closed fracture of right tibial plateau 01/31/2018   Complex tear of medial meniscus of right knee 03/27/2018   COPD (chronic obstructive pulmonary disease) (HCC)    no inhaler   Diabetes mellitus without complication (HCC)    DJD (degenerative joint disease)    Fibromyalgia    Genetic testing 03/08/2017   Ms. Lathon underwent genetic counseling and testing for hereditary cancer syndromes on 02/21/2017. Her results were negative for mutations in all 83 genes analyzed by [facility]'s 83-gene Multi-Cancers Panel. 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   History of abnormal mammogram    History of arthroplasty of right knee 07/06/2018   Hyperlipidemia    Hypertension    Irregular heart beat    Neuromuscular disorder (HCC)    fibromyalgia   Sleep apnea    cpap some   Stress fracture of right tibia 03/27/2018   SVD (  spontaneous vaginal delivery)    x 2   Tubular adenoma of colon 05/09/12   "innumerable number" of flat polyps    Past Surgical History:  Procedure Laterality Date   CARPAL TUNNEL RELEASE Left 10/20/14   [facility]   CATARACT EXTRACTION, BILATERAL     CESAREAN SECTION  1977   CHOLECYSTECTOMY  1990   COLONOSCOPY     IR RADIOLOGIST EVAL & MGMT  07/16/2019   KNEE ARTHROSCOPY WITH SUBCHONDROPLASTY Right 03/27/2018   Procedure: Right knee arthroscopic partial medial meniscus  repair versus menisectomy with medial tibia subchondroplasty;  Surgeon: Yolonda Kida, MD;  Location: [facility] OR;  Service: Orthopedics;  Laterality: Right;  120 mins   LARYNX SURGERY     POLYP REMOVAL   MULTIPLE TOOTH EXTRACTIONS     POLYPECTOMY     vocal cord   TONSILLECTOMY  1965   TUBAL LIGATION     TUMOR REMOVAL     benign fatty tumors on abdomen   UPPER GASTROINTESTINAL ENDOSCOPY      Social History   Socioeconomic History   Marital status: Married    Spouse name: Molly Maduro "goes by [employer]"   Number of children: 3   Years of education: 11   Highest education level: GED or equivalent  Occupational History   Occupation: retired  Tobacco Use   Smoking status: Former    Current packs/day: 0.00    Average packs/day: 1 pack/day for 3.0 years (3.0 ttl pk-yrs)    Types: Cigarettes    Start date: 08/15/1966    Quit date: 08/15/1969    Years since quitting: 54.0   Smokeless tobacco: Never  Vaping Use   Vaping status: Never Used  Substance and Sexual Activity   Alcohol use: No    Alcohol/week: 0.0 standard drinks of alcohol   Drug use: No   Sexual activity: Not Currently  Other Topics Concern   Not on file  Social History Narrative   Daughter lives with them and helps a lot   Social Drivers of Health   Financial Resource Strain: Low Risk  (12/16/2022)   Overall Financial Resource Strain (CARDIA)    Difficulty of Paying Living Expenses: Not hard at all  Food Insecurity: No Food Insecurity (07/04/2023)   Hunger Vital Sign    Worried About Running Out of Food in the Last Year: Never true    Ran Out of Food in the Last Year: Never true  Transportation Needs: No Transportation Needs (07/04/2023)   PRAPARE - Administrator, Civil Service (Medical): No    Lack of Transportation (Non-Medical): No  Physical Activity: Inactive (12/16/2022)   Exercise Vital Sign    Days of Exercise per Week: 0 days    Minutes of Exercise per Session: 0 min  Stress: No Stress Concern  Present (12/16/2022)   [employer] of Occupational Health - Occupational Stress Questionnaire    Feeling of Stress : Not at all  Social Connections: Socially Integrated (12/16/2022)   Social Connection and Isolation Panel [NHANES]    Frequency of Communication with Friends and Family: More than three times a week    Frequency of Social Gatherings with Friends and Family: More than three times a week    Attends Religious Services: More than 4 times per year    Active Member of [employer] or Organizations: Yes    Attends Engineer, structural: More than 4 times per year    Marital Status: Married  Catering manager Violence: Not At  Risk (07/04/2023)   Humiliation, Afraid, Rape, and Kick questionnaire    Fear of Current or Ex-Partner: No    Emotionally Abused: No    Physically Abused: No    Sexually Abused: No    Outpatient Encounter Medications as of 09/04/2023  Medication Sig   albuterol (VENTOLIN HFA) 108 (90 Base) MCG/ACT inhaler Inhale 2 puffs into the lungs every 6 (six) hours as needed for wheezing or shortness of breath.   ALPRAZolam (XANAX) 0.25 MG tablet Take 1/2-1 tablet once daily as needed for severe anxiety/ panic attack. May cause hallucinations/ falls   ascorbic acid (VITAMIN C) 500 MG tablet Take 500 mg by mouth daily.   Biotin 5000 MCG SUBL Place under the tongue.   Blood Glucose Monitoring Suppl (ONETOUCH VERIO FLEX SYSTEM) w/Device KIT Check Bs up to 4 times daily Dx E11.9,   Cholecalciferol (VITAMIN D3) 5000 units CAPS Take 5,000 Units by mouth daily.   cyclobenzaprine (FLEXERIL) 5 MG tablet TAKE 1 TABLET BY MOUTH THREE TIMES DAILY AS NEEDED FOR MUSCLE SPASM . APPOINTMENT REQUIRED FOR FUTURE REFILLS   diltiazem (CARDIZEM CD) 120 MG 24 hr capsule Take 1 capsule (120 mg total) by mouth every Monday, Wednesday, and Friday.   fluticasone (FLONASE) 50 MCG/ACT nasal spray Place 2 sprays into both nostrils daily.   furosemide (LASIX) 40 MG tablet Take 1 tablet (40 mg  total) by mouth 2 (two) times daily.   glucose blood (ONETOUCH VERIO) test strip Check Bs up to 4 times daily Dx E11.9,   hydrocortisone cream 1 % Apply 1 Application topically 2 (two) times daily. Apply a sparingly (thin film) to facial rash twice daily for up to a week   lactulose (CHRONULAC) 10 GM/15ML solution TAKE 15 ML BY MOUTH  THREE TIMES DAILY AS NEEDED   meclizine (ANTIVERT) 25 MG tablet TAKE 1 TABLET BY MOUTH THREE TIMES DAILY AS NEEDED FOR DIZZINESS   Misc Natural Products (BEET ROOT PO) Take 1 Capful by mouth daily.   ondansetron (ZOFRAN-ODT) 4 MG disintegrating tablet DISSOLVE 1 TABLET IN MOUTH EVERY 6 HOURS AS NEEDED FOR NAUSEA AND VOMITING   ONETOUCH DELICA LANCETS 33G MISC USE ONE LANCET TO CHECK GLUCOSE ONCE DAILY AND AS NEEDED   spironolactone (ALDACTONE) 50 MG tablet Take 0.5 tablets (25 mg total) by mouth daily.   XIFAXAN 550 MG TABS tablet Take 1 tablet (550 mg total) by mouth 2 (two) times daily.   No facility-administered encounter medications on file as of 09/04/2023.    Allergies  Allergen Reactions   Latex Other (See Comments)    "my hands feels numb"    Review of Systems  Constitutional:  Positive for appetite change, chills and fever.       102  HENT:  Negative for congestion and sore throat.   Cardiovascular:  Negative for chest pain and palpitations.  Gastrointestinal:  Negative for nausea and vomiting.  Skin:        Positive jaundice  Neurological:  Negative for dizziness and headaches.    Observations/Objective: No vital signs or physical exam, this was a virtual health encounter.  Pt alert and oriented, answers all questions appropriately, and able to speak in full sentences.   Diagnoses and all orders for this visit:  Jaundice -     Ammonia  Liver cirrhosis secondary to NASH (HCC) -     Ammonia  Fever in other diseases -     CBC with Differential/Platelet    Assessment and Plan: Stacey Summers is 78  yrs old caucasian female seen today via  telehealth for cough, and fever Based on client presentation STAT CBC & Ammonia is order, daughter will bring her to the lab  Follow Up Instructions: No follow-ups on file.    I discussed the assessment and treatment plan with the patient. The patient was provided an opportunity to ask questions and all were answered. The patient agreed with the plan and demonstrated an understanding of the instructions.   The patient was advised to call back or seek an in-person evaluation if the symptoms worsen or if the condition fails to improve as anticipated.  The above assessment and management plan was discussed with the patient. The patient verbalized understanding of and has agreed to the management plan. Patient is aware to call the clinic if they develop any new symptoms or if symptoms persist or worsen. Patient is aware when to return to the clinic for a follow-up visit. Patient educated on when it is appropriate to go to the emergency department.    I provided *** minutes of time during this *** encounter.   Arrie Aran Santa Lighter, DNP [facility] [address] [place], [place] [address] [phone] 09/04/2023

## 2023-09-05 ENCOUNTER — Telehealth: Payer: Self-pay | Admitting: Gastroenterology

## 2023-09-05 NOTE — Telephone Encounter (Signed)
Reviewed labs from yesterday, overall stable.  No significant change in bilirubin, she has had mild elevation in total bilirubin in the past.  No significant change in CBC or CMP compared to previous labs.  Please advise patient to complete antibiotics as prescribed by her PCP, follow-up in office visit in February as scheduled.  If her respiratory symptoms worsen, she will need to come to ER for evaluation and management.  Thank you

## 2023-09-05 NOTE — Telephone Encounter (Signed)
Labs are in EPIC. [name] tells me her mom is on Zpack for an upper respiratory infection. The PCP wanted you to be aware of her CBC.  Patient has not had black or bloody stools. She has complained a couple of times of pain in her side. She is pain free today.  Patient has an appointment here in February.

## 2023-09-05 NOTE — Telephone Encounter (Signed)
Inbound call from patients daughter stating that patient was seen by video visit with patients PCP due to patient having a cough and fever. They advised daughter she needed to bring patient in because she looked jaundice and needed to get lab work. Daughter stated she got a call this morning and was advised that patients red blood cells were low and that she needed to call our office to see what we advise patient to do. Requesting a call back to discuss, please advise.

## 2023-09-06 NOTE — Telephone Encounter (Signed)
[name] contacted and advised.

## 2023-09-11 ENCOUNTER — Telehealth: Payer: Self-pay | Admitting: Family Medicine

## 2023-09-11 ENCOUNTER — Ambulatory Visit: Payer: Self-pay | Admitting: Family Medicine

## 2023-09-11 NOTE — Telephone Encounter (Signed)
She does have to be seen.  A Z-Pak will stay in the system for up to 2 weeks.  If she is having ongoing concerning symptomology she really needs imaging and evaluation.

## 2023-09-11 NOTE — Telephone Encounter (Signed)
Again, I am not willing to prescribe this without evaluating her because she has high risk comorbidities.  The azithromycin last in the bloodstream for 10 to 14 days.  She was just prescribed this less than a week ago.  If she is continuing to have symptoms and or concerning features she needs to be seen.

## 2023-09-11 NOTE — Telephone Encounter (Signed)
Copied from CRM [id number]. Topic: Clinical - Prescription Issue >> Sep 11, 2023  2:09 PM Geroge Baseman wrote: Reason for CRM: Patient is still not feeling well, she is not wanting to come in for an appointment. They are wanting to know if she is able to get another round of antibiotics. She is feeling better but still has coughing and wheezing. [phone] [name].

## 2023-09-11 NOTE — Telephone Encounter (Signed)
Chief Complaint: wheezing Symptoms: cough  Frequency: about a week Pertinent Negatives: Patient denies hemoptysis, severe DOB, fevers Disposition: [] ED /[x] Urgent Care (no appt availability in office) / [] Appointment(In office/virtual)/ []  [facility]/ [] Home Care/ [x] Refused Recommended Disposition /[] [facility]/ []  Follow-up with PCP Additional Notes: Patient daughter, [name] calling for patient. Patient verbally agreed to triager to discuss sx with [name]. [name] reports that patient recently had telehealth visit and was prescribed Z-pack and finished rx Saturday. Currently with wheezing and no symptom improvement. Daughter asking for additional abx, as patient does not want to leave house or see a doctor. Triager strongly reinforced that physical visit is recommended. Offered to schedule UC appt for patient, but due to location, [name] preferred to go to the UC near her house. Strongly endorsed disposition and [name] verbalized understanding. Triager will forward to PCP office to follow up.   Copied from CRM [id number]. Topic: Clinical - Red Word Triage >> Sep 11, 2023  1:44 PM Payton Doughty wrote: Red Word that prompted transfer to Nurse Triage: pt is still coughing and wheezing Reason for Disposition  Wheezing is present  Answer Assessment - Initial Assessment Questions 1. ONSET: "When did the cough begin?"      A week ago 2. SEVERITY: "How bad is the cough today?"      She is coughing that sounds that something is breaking up 3. SPUTUM: "Describe the color of your sputum" (none, dry cough; clear, white, yellow, green)     I don't know 4. HEMOPTYSIS: "Are you coughing up any blood?" If so ask: "How much?" (flecks, streaks, tablespoons, etc.)     I didn't take time to look Daughter reports no 5. DIFFICULTY BREATHING: "Are you having difficulty breathing?" If Yes, ask: "How bad is it?" (e.g., mild, moderate, severe)    - MILD: No SOB at rest, mild SOB with walking, speaks  normally in sentences, can lie down, no retractions, pulse < 100.    - MODERATE: SOB at rest, SOB with minimal exertion and prefers to sit, cannot lie down flat, speaks in phrases, mild retractions, audible wheezing, pulse 100-120.    - SEVERE: Very SOB at rest, speaks in single words, struggling to breathe, sitting hunched forward, retractions, pulse > 120      moderate 6. FEVER: "Do you have a fever?" If Yes, ask: "What is your temperature, how was it measured, and when did it start?"     Not anymore 7. CARDIAC HISTORY: "Do you have any history of heart disease?" (e.g., heart attack, congestive heart failure)      CHF 8. LUNG HISTORY: "Do you have any history of lung disease?"  (e.g., pulmonary embolus, asthma, emphysema)     COPD 9. PE RISK FACTORS: "Do you have a history of blood clots?" (or: recent major surgery, recent prolonged travel, bedridden)     no 10. OTHER SYMPTOMS: "Do you have any other symptoms?" (e.g., runny nose, wheezing, chest pain)       Wheezing weak   Recent televisit and given Z-pack. Finished Saturday  Answer Assessment - Initial Assessment Questions 1. RESPIRATORY STATUS: "Describe your breathing?" (e.g., wheezing, shortness of breath, unable to speak, severe coughing)      wheezing 2. ONSET: "When did this breathing problem begin?"      Worsening over the past week 3. PATTERN "Does the difficult breathing come and go, or has it been constant since it started?"      I can hear it when she's  sitting down in the living room 4. SEVERITY: "How bad is your breathing?" (e.g., mild, moderate, severe)    - MILD: No SOB at rest, mild SOB with walking, speaks normally in sentences, can lie down, no retractions, pulse < 100.    - MODERATE: SOB at rest, SOB with minimal exertion and prefers to sit, cannot lie down flat, speaks in phrases, mild retractions, audible wheezing, pulse 100-120.    - SEVERE: Very SOB at rest, speaks in single words, struggling to breathe, sitting  hunched forward, retractions, pulse > 120      moderate   6. CARDIAC HISTORY: "Do you have any history of heart disease?" (e.g., heart attack, angina, bypass surgery, angioplasty)      CHF 7. LUNG HISTORY: "Do you have any history of lung disease?"  (e.g., pulmonary embolus, asthma, emphysema)     COPD 8. CAUSE: "What do you think is causing the breathing problem?"      Reports that she needs more abx  Protocols used: Cough - Acute Productive-A-AH, Breathing Difficulty-A-AH

## 2023-09-12 ENCOUNTER — Other Ambulatory Visit (HOSPITAL_COMMUNITY): Payer: Self-pay

## 2023-09-12 ENCOUNTER — Telehealth: Payer: Self-pay | Admitting: Pharmacy Technician

## 2023-09-12 NOTE — Telephone Encounter (Signed)
I spoke with patient and gave her PCPs advise. Patient voiced understanding and said that she would talk with her daughter about it and call us back if she decided to make an appt. Patient says she does feel some better and has an inhaler that is helping.

## 2023-09-12 NOTE — Telephone Encounter (Signed)
I've got a 4pm right now that [facility] can offer her if she wants to be seen today.

## 2023-09-13 NOTE — Telephone Encounter (Signed)
I actually did this part and faxed to them on 1/15 with confirmation

## 2023-09-14 ENCOUNTER — Telehealth: Payer: Self-pay | Admitting: Gastroenterology

## 2023-09-14 NOTE — Telephone Encounter (Signed)
Patient daughter [name] called and stated that she received in the mail a denial letter for her mother's XIFAXAN. Patient daughter is requesting a call back. Please advise. A good call back number is [phone]

## 2023-09-14 NOTE — Telephone Encounter (Signed)
Rx Team, Have you guys seen a denial on this patient? I know I faxed everything and sent you all a copy the other day. I have not received a denial on my end.. Thanks

## 2023-09-15 ENCOUNTER — Other Ambulatory Visit: Payer: Self-pay | Admitting: Family Medicine

## 2023-09-15 ENCOUNTER — Other Ambulatory Visit: Payer: Self-pay | Admitting: Cardiovascular Disease

## 2023-09-15 DIAGNOSIS — F411 Generalized anxiety disorder: Secondary | ICD-10-CM

## 2023-09-15 DIAGNOSIS — G8929 Other chronic pain: Secondary | ICD-10-CM

## 2023-09-18 ENCOUNTER — Other Ambulatory Visit: Payer: Self-pay | Admitting: Family Medicine

## 2023-09-18 DIAGNOSIS — F411 Generalized anxiety disorder: Secondary | ICD-10-CM

## 2023-09-18 DIAGNOSIS — G8929 Other chronic pain: Secondary | ICD-10-CM

## 2023-09-19 NOTE — Telephone Encounter (Signed)
Patient is requesting to speak with someone for message below.

## 2023-09-23 ENCOUNTER — Other Ambulatory Visit: Payer: Self-pay | Admitting: Gastroenterology

## 2023-09-27 ENCOUNTER — Ambulatory Visit: Payer: PPO | Admitting: Physician Assistant

## 2023-09-27 ENCOUNTER — Other Ambulatory Visit (INDEPENDENT_AMBULATORY_CARE_PROVIDER_SITE_OTHER): Payer: PPO

## 2023-09-27 ENCOUNTER — Encounter: Payer: Self-pay | Admitting: Physician Assistant

## 2023-09-27 VITALS — BP 104/70 | HR 41 | Ht 62.0 in | Wt 229.0 lb

## 2023-09-27 DIAGNOSIS — Z860101 Personal history of adenomatous and serrated colon polyps: Secondary | ICD-10-CM | POA: Diagnosis not present

## 2023-09-27 DIAGNOSIS — R5382 Chronic fatigue, unspecified: Secondary | ICD-10-CM | POA: Diagnosis not present

## 2023-09-27 DIAGNOSIS — D696 Thrombocytopenia, unspecified: Secondary | ICD-10-CM | POA: Diagnosis not present

## 2023-09-27 DIAGNOSIS — K7460 Unspecified cirrhosis of liver: Secondary | ICD-10-CM

## 2023-09-27 DIAGNOSIS — K7290 Hepatic failure, unspecified without coma: Secondary | ICD-10-CM

## 2023-09-27 DIAGNOSIS — K7469 Other cirrhosis of liver: Secondary | ICD-10-CM | POA: Diagnosis not present

## 2023-09-27 DIAGNOSIS — R600 Localized edema: Secondary | ICD-10-CM

## 2023-09-27 LAB — CBC WITH DIFFERENTIAL/PLATELET
Basophils Absolute: 0 10*3/uL (ref 0.0–0.1)
Basophils Relative: 0.7 % (ref 0.0–3.0)
Eosinophils Absolute: 0.1 10*3/uL (ref 0.0–0.7)
Eosinophils Relative: 1.8 % (ref 0.0–5.0)
HCT: 37.8 % (ref 36.0–46.0)
Hemoglobin: 12.9 g/dL (ref 12.0–15.0)
Lymphocytes Relative: 37 % (ref 12.0–46.0)
Lymphs Abs: 2.3 10*3/uL (ref 0.7–4.0)
MCHC: 34.1 g/dL (ref 30.0–36.0)
MCV: 101.5 fL — ABNORMAL HIGH (ref 78.0–100.0)
Monocytes Absolute: 0.7 10*3/uL (ref 0.1–1.0)
Monocytes Relative: 11.2 % (ref 3.0–12.0)
Neutro Abs: 3 10*3/uL (ref 1.4–7.7)
Neutrophils Relative %: 49.3 % (ref 43.0–77.0)
Platelets: 98 10*3/uL — ABNORMAL LOW (ref 150.0–400.0)
RBC: 3.72 Mil/uL — ABNORMAL LOW (ref 3.87–5.11)
RDW: 14.5 % (ref 11.5–15.5)
WBC: 6.1 10*3/uL (ref 4.0–10.5)

## 2023-09-27 LAB — COMPREHENSIVE METABOLIC PANEL
ALT: 21 U/L (ref 0–35)
AST: 43 U/L — ABNORMAL HIGH (ref 0–37)
Albumin: 3 g/dL — ABNORMAL LOW (ref 3.5–5.2)
Alkaline Phosphatase: 90 U/L (ref 39–117)
BUN: 25 mg/dL — ABNORMAL HIGH (ref 6–23)
CO2: 34 meq/L — ABNORMAL HIGH (ref 19–32)
Calcium: 9.6 mg/dL (ref 8.4–10.5)
Chloride: 102 meq/L (ref 96–112)
Creatinine, Ser: 1.2 mg/dL (ref 0.40–1.20)
GFR: 44.28 mL/min — ABNORMAL LOW (ref >60.00)
Glucose, Bld: 127 mg/dL — ABNORMAL HIGH (ref 70–99)
Potassium: 4.3 meq/L (ref 3.5–5.1)
Sodium: 141 meq/L (ref 135–145)
Total Bilirubin: 4.1 mg/dL — ABNORMAL HIGH (ref 0.2–1.2)
Total Protein: 5.7 g/dL — ABNORMAL LOW (ref 6.0–8.3)

## 2023-09-27 LAB — PROTIME-INR
INR: 1.5 {ratio} — ABNORMAL HIGH (ref 0.8–1.0)
Prothrombin Time: 15.6 s — ABNORMAL HIGH (ref 9.6–13.1)

## 2023-09-27 NOTE — Patient Instructions (Signed)
_______________________________________________________  If your blood pressure at your visit was 140/90 or greater, please contact your primary care physician to follow up on this. _______________________________________________________  If you are age 76 or older, your body mass index should be between 23-30. Your Body mass index is 41.88 kg/m. If this is out of the aforementioned range listed, please consider follow up with your Primary Care Provider. ________________________________________________________  The [facility] providers would like to encourage you to use [facility] to communicate with providers for non-urgent requests or questions.  Due to long hold times on the telephone, sending your provider a message by [facility] may be a faster and more efficient way to get a response.  Please allow 48 business hours for a response.  Please remember that this is for non-urgent requests.  _______________________________________________________  Your provider has requested that you go to the basement level for lab work before leaving today. Press "B" on the elevator. The lab is located at the first door on the left as you exit the elevator.  You have been scheduled for an abdominal ultrasound at [facility] (1st floor of hospital) on 10-06-23 at 8:30am. Please arrive 15 minutes prior to your appointment for registration. Make certain not to have anything to eat or drink 6 hours prior to your appointment. Should you need to reschedule your appointment, please contact radiology at [phone]. This test typically takes about 30 minutes to perform.   Due to recent changes in healthcare laws, you may see the results of your imaging and laboratory studies on MyChart before your provider has had a chance to review them.  We understand that in some cases there may be results that are confusing or concerning to you. Not all laboratory results come back in the same time frame and the provider  may be waiting for multiple results in order to interpret others.  Please give [place] 48 hours in order for your provider to thoroughly review all the results before contacting the office for clarification of your results.   Thank you for entrusting me with your care and choosing [facility].  Hyacinth Meeker, PA-C

## 2023-09-27 NOTE — Progress Notes (Signed)
Chief Complaint: Follow up cirrhosis  HPI:    Stacey Summers is a 78 year old female with a past medical history as listed below including NASH cirrhosis, COPD and multiple others, who was referred to me by [name], DO for a complaint of lower leg pain and back pain.    08/07/2018 colonoscopy with four 4-9 mm polyps in the transverse and ascending colon as well as diverticulosis in the sigmoid colon nonbleeding internal hemorrhoids.  Recall discontinued due to age and comorbidities.    08/20/2019 EGD with grade 1 and small less than 5 mm varices and portal hypertensive gastropathy.    06/30/2022 MRI of the abdomen with and without contrast showed nodular cirrhotic morphology of the liver.  Very narrowed appearance of the portal vein.  Status post cholecystectomy and trace perihepatic ascites.    07/01/2023-07/03/2023 patient admitted to the hospital for sepsis.  Also had anasarca.  She was admitted for altered mentation and fevers.  Diagnosed with an upper respiratory infection and started on Cefepime and Vancomycin.  Blood cultures negative.  Discharged home on Augmentin.  Noted to have chronic diastolic dysfunction CHF.  Echo 07/02/2023 with an EF of 55-60% with mild concentric LVH.  Patient developed AKI with IV Lasix.  Continued on Lactulose and Rifaximin at discharge.  Told to use Cardizem Monday Wednesday Friday.    09/04/2023 patient had a video visit with her PCP for a cough and fever.  PCP notified us due to some low hemoglobin.  Patient was not describing any GI bleed.  Dr. Lavon Paganini reviewed and saw no significant change in CBC or CMP.    09/04/2023 CBC stable.  Platelets now at 92.  Ammonia level minimally elevated at 69 from 14 on 07/01/2023.    Today, patient presents to clinic accompanied by her daughter.  She is very hard of hearing so her daughter helps to translate for me.  Most concerning to her is her recent hospital admission for sepsis which sounds like it is due to URI.   Apparently had repeat URI symptoms later in January, but now seems to be better from that point of view.  In general symptoms seem stable from her cirrhosis.  She remains with some bilateral lower extremity swelling but this is pretty common for her, diuretics managed by the nephrologist.  Her daughter tells me she has been having some leg tingling if she keeps her legs up too long and has to stand up.  Her mom blames it on restless leg syndrome.  Also complains of occasional left upper quadrant pain that seems to come and go, may be better with a bowel movement.  Her lactulose seems to work okay for her, but sometimes causes urgent stools so she does not take it if she is planning to go out for the day.  They are working on getting her Xifaxan covered again.    Denies fever, chills or weight gain or weight loss.  Past Medical History:  Diagnosis Date   Allergy    seasonal   Anxiety    Arthritis    hands, back    B12 deficiency 03/31/2021   Cataract    surgery to remove   Chronic back pain    lower back    Cirrhosis of liver without ascites (HCC)    recent dx 07/11/18   Closed fracture of right tibial plateau 01/31/2018   Complex tear of medial meniscus of right knee 03/27/2018   COPD (chronic obstructive pulmonary disease) (HCC)  no inhaler   Diabetes mellitus without complication (HCC)    DJD (degenerative joint disease)    Fibromyalgia    Genetic testing 03/08/2017   Ms. Andreoli underwent genetic counseling and testing for hereditary cancer syndromes on 02/21/2017. Her results were negative for mutations in all 83 genes analyzed by [facility]'s 83-gene Multi-Cancers Panel. 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   History of abnormal mammogram     History of arthroplasty of right knee 07/06/2018   Hyperlipidemia    Hypertension    Irregular heart beat    Neuromuscular disorder (HCC)    fibromyalgia   Sleep apnea    cpap some   Stress fracture of right tibia 03/27/2018   SVD (spontaneous vaginal delivery)    x 2   Tubular adenoma of colon 05/09/12   "innumerable number" of flat polyps    Past Surgical History:  Procedure Laterality Date   CARPAL TUNNEL RELEASE Left 10/20/14   [facility]   CATARACT EXTRACTION, BILATERAL     CESAREAN SECTION  1977   CHOLECYSTECTOMY  1990   COLONOSCOPY     IR RADIOLOGIST EVAL & MGMT  07/16/2019   KNEE ARTHROSCOPY WITH SUBCHONDROPLASTY Right 03/27/2018   Procedure: Right knee arthroscopic partial medial meniscus repair versus menisectomy with medial tibia subchondroplasty;  Surgeon: Yolonda Kida, MD;  Location: [facility] OR;  Service: Orthopedics;  Laterality: Right;  120 mins   LARYNX SURGERY     POLYP REMOVAL   MULTIPLE TOOTH EXTRACTIONS     POLYPECTOMY     vocal cord   TONSILLECTOMY  1965   TUBAL LIGATION     TUMOR REMOVAL     benign fatty tumors on abdomen   UPPER GASTROINTESTINAL ENDOSCOPY      Current Outpatient Medications  Medication Sig Dispense Refill   albuterol (VENTOLIN HFA) 108 (90 Base) MCG/ACT inhaler Inhale 2 puffs into the lungs every 6 (six) hours as needed for wheezing or shortness of breath. 18 g 1   ALPRAZolam (XANAX) 0.25 MG tablet Take 1/2-1 tablet once daily as needed for severe anxiety/ panic attack. May cause hallucinations/ falls 30 tablet 0   ascorbic acid (VITAMIN C) 500 MG tablet Take 500 mg by mouth daily.     Biotin 5000 MCG SUBL Place under the tongue.     Blood Glucose Monitoring Suppl (ONETOUCH VERIO FLEX SYSTEM) w/Device KIT Check Bs up to 4 times daily Dx E11.9, 1 kit 0   Cholecalciferol (VITAMIN D3) 5000 units CAPS Take 5,000 Units by mouth daily.     cyclobenzaprine (FLEXERIL) 5 MG tablet TAKE 1 TABLET BY MOUTH THREE TIMES DAILY AS NEEDED FOR MUSCLE  SPASM . APPOINTMENT REQUIRED FOR FUTURE REFILLS 30 tablet 0   diltiazem (CARDIZEM CD) 120 MG 24 hr capsule Take 1 capsule (120 mg total) by mouth every Monday, Wednesday, and Friday. 45 capsule 2   fluticasone (FLONASE) 50 MCG/ACT nasal spray Place 2 sprays into both nostrils daily. 16 g 6   furosemide (LASIX) 40 MG tablet Take 1 tablet (40 mg total) by mouth 2 (two) times daily. 270 tablet 2   glucose blood (ONETOUCH VERIO) test strip Check Bs up to 4 times daily Dx E11.9, 400 each 3   hydrocortisone cream 1 % Apply 1  Application topically 2 (two) times daily. Apply a sparingly (thin film) to facial rash twice daily for up to a week 30 g 0   lactulose (CHRONULAC) 10 GM/15ML solution TAKE 15 ML BY MOUTH  THREE TIMES DAILY AS NEEDED 946 mL 0   meclizine (ANTIVERT) 25 MG tablet TAKE 1 TABLET BY MOUTH THREE TIMES DAILY AS NEEDED FOR DIZZINESS 90 tablet 1   Misc Natural Products (BEET ROOT PO) Take 1 Capful by mouth daily.     ondansetron (ZOFRAN-ODT) 4 MG disintegrating tablet DISSOLVE 1 TABLET IN MOUTH EVERY 6 HOURS AS NEEDED FOR NAUSEA AND VOMITING 20 tablet 0   ONETOUCH DELICA LANCETS 33G MISC USE ONE LANCET TO CHECK GLUCOSE ONCE DAILY AND AS NEEDED 100 each 0   spironolactone (ALDACTONE) 25 MG tablet Take 1 tablet by mouth once daily 90 tablet 3   spironolactone (ALDACTONE) 50 MG tablet Take 0.5 tablets (25 mg total) by mouth daily. 45 tablet 1   XIFAXAN 550 MG TABS tablet Take 1 tablet (550 mg total) by mouth 2 (two) times daily. 180 tablet 2   No current facility-administered medications for this visit.    Allergies as of 09/27/2023 - Review Complete 09/27/2023  Allergen Reaction Noted   Latex Other (See Comments) 03/27/2018    Family History  Problem Relation Age of Onset   Heart failure Mother        d.37   Heart disease Mother    Prostate cancer Father        d.89 diagnosed in late-60s   Colon cancer Father        diagnosed at 48   Leukemia Maternal Aunt    Heart failure  Maternal Uncle        d.50   Stomach cancer Paternal Aunt        questionable   Colon cancer Paternal Uncle        in his 29s   Heart failure Maternal Grandmother        d.45   Kidney cancer Cousin        d.78s maternal first-cousin   Colon polyps Brother        polyposis. Underwent partial colectomy at age 40.   Spina bifida Brother        died at birth   Colon polyps Son 66   Heart attack Son 46   Other Other 16       multiple lipomas   Esophageal cancer Neg Hx    Rectal cancer Neg Hx    Breast cancer Neg Hx     Social History   Socioeconomic History   Marital status: Married    Spouse name: Molly Maduro "goes by [employer]"   Number of children: 3   Years of education: 11   Highest education level: GED or equivalent  Occupational History   Occupation: retired  Tobacco Use   Smoking status: Former    Current packs/day: 0.00    Average packs/day: 1 pack/day for 3.0 years (3.0 ttl pk-yrs)    Types: Cigarettes    Start date: 08/15/1966    Quit date: 08/15/1969    Years since quitting: 54.1   Smokeless tobacco: Never  Vaping Use   Vaping status: Never Used  Substance and Sexual Activity   Alcohol use: No    Alcohol/week: 0.0 standard drinks of alcohol   Drug use: No   Sexual activity: Not Currently  Other Topics Concern   Not on file  Social History Narrative   Daughter  lives with them and helps a lot   Social Drivers of Health   Financial Resource Strain: Low Risk  (12/16/2022)   Overall Financial Resource Strain (CARDIA)    Difficulty of Paying Living Expenses: Not hard at all  Food Insecurity: No Food Insecurity (07/04/2023)   Hunger Vital Sign    Worried About Running Out of Food in the Last Year: Never true    Ran Out of Food in the Last Year: Never true  Transportation Needs: No Transportation Needs (07/04/2023)   PRAPARE - Administrator, Civil Service (Medical): No    Lack of Transportation (Non-Medical): No  Physical Activity: Inactive (12/16/2022)    Exercise Vital Sign    Days of Exercise per Week: 0 days    Minutes of Exercise per Session: 0 min  Stress: No Stress Concern Present (12/16/2022)   [employer] of Occupational Health - Occupational Stress Questionnaire    Feeling of Stress : Not at all  Social Connections: Socially Integrated (12/16/2022)   Social Connection and Isolation Panel [NHANES]    Frequency of Communication with Friends and Family: More than three times a week    Frequency of Social Gatherings with Friends and Family: More than three times a week    Attends Religious Services: More than 4 times per year    Active Member of [employer] or Organizations: Yes    Attends Engineer, structural: More than 4 times per year    Marital Status: Married  Catering manager Violence: Not At Risk (07/04/2023)   Humiliation, Afraid, Rape, and Kick questionnaire    Fear of Current or Ex-Partner: No    Emotionally Abused: No    Physically Abused: No    Sexually Abused: No    Review of Systems:    Constitutional: No weight loss, fever or chills Skin: No rash  Cardiovascular: No chest pain  Respiratory: No SOB Gastrointestinal: See HPI and otherwise negative Genitourinary: No dysuria  Neurological: No headache, dizziness or syncope Musculoskeletal: No new muscle or joint pain Hematologic: No bleeding Psychiatric: No history of depression or anxiety   Physical Exam:  Vital signs: BP 104/70   Pulse (!) 41   Ht 5\' 2"  (1.575 m)   Wt 229 lb (103.9 kg)   BMI 41.88 kg/m    Constitutional:   Pleasant elderly Caucasian female appears to be in NAD, Well developed, Well nourished, alert and cooperative Head:  Normocephalic and atraumatic. Eyes:   PEERL, EOMI. No icterus. Conjunctiva pink. Ears:  +HOH Neck:  Supple Throat: Oral cavity and pharynx without inflammation, swelling or lesion.  Respiratory: Respirations even and unlabored. Lungs clear to auscultation bilaterally.   No wheezes, crackles, or rhonchi.   Cardiovascular: Normal S1, S2. No MRG. Regular rate and rhythm. +2+ B/l LE edema Gastrointestinal:  Soft, nondistended, nontender. No rebound or guarding. Normal bowel sounds. No appreciable masses or hepatomegaly. Rectal:  Not performed.  Msk:  Symmetrical without gross deformities. Without edema, no deformity or joint abnormality. +ambulates with cane Neurologic:  Alert and  oriented x4;  grossly normal neurologically.  Skin:   Dry and intact without significant lesions or rashes. Psychiatric: Demonstrates good judgement and reason without abnormal affect or behaviors.  RELEVANT LABS AND IMAGING: CBC    Component Value Date/Time   WBC 4.4 09/04/2023 1426   WBC 5.7 08/22/2023 1442   RBC 3.59 (L) 09/04/2023 1426   RBC 3.74 (L) 08/22/2023 1442   HGB 12.6 09/04/2023 1426   HCT 35.7  09/04/2023 1426   PLT 92 (LL) 09/04/2023 1426   MCV 99 (H) 09/04/2023 1426   MCH 35.1 (H) 09/04/2023 1426   MCH 33.4 08/22/2023 1442   MCHC 35.3 09/04/2023 1426   MCHC 32.7 08/22/2023 1442   RDW 12.4 09/04/2023 1426   LYMPHSABS 0.7 09/04/2023 1426   MONOABS 0.5 08/22/2023 1442   EOSABS 0.0 09/04/2023 1426   BASOSABS 0.0 09/04/2023 1426    CMP     Component Value Date/Time   NA 137 08/22/2023 1442   NA 139 07/04/2023 1236   K 3.6 08/22/2023 1442   CL 101 08/22/2023 1442   CO2 31 08/22/2023 1442   GLUCOSE 152 (H) 08/22/2023 1442   BUN 31 (H) 08/22/2023 1442   BUN 55 (H) 07/04/2023 1236   CREATININE 1.00 08/22/2023 1442   CREATININE 0.67 12/20/2012 1044   CALCIUM 9.4 08/22/2023 1442   PROT 5.6 (L) 08/22/2023 1442   PROT 5.2 (L) 01/21/2022 1016   ALBUMIN 2.8 (L) 08/22/2023 1442   ALBUMIN 2.5 (L) 03/01/2022 1522   AST 47 (H) 08/22/2023 1442   ALT 22 08/22/2023 1442   ALKPHOS 80 08/22/2023 1442   BILITOT 4.0 (H) 08/22/2023 1442   BILITOT 2.6 (H) 01/21/2022 1016   GFRNONAA 59 (L) 08/22/2023 1442   GFRNONAA >89 12/20/2012 1044   GFRAA >60 10/04/2019 0820   GFRAA >89 12/20/2012 1044     Assessment: 1.  Decompensated MASLD cirrhosis: With esophageal varices (last EGD 2021), history of hepatic encephalopathy and ascites (diuretics managed by nephrology), currently no further decompensation on exam 2.  Thrombocytopenia: With above 3.  Chronic fatigue: Secondary to comorbid conditions including cirrhosis 4.  History of adenomatous polyps: Dr. Lavon Paganini recommends no further surveillance given age and comorbidities  Plan: 1.  Will update patient's labs today including CBC, CMP, PT/INR and AFP. 2.  Ordered right upper quadrant ultrasound for HCC surveillance 3.  Currently our office is working on her prior authorization for [employer].  Followed up with this today. 4.  Continue Lactulose 3 times daily. 5.  Continue Aldactone under discretion from nephrology 6.  Will recalculate MELD with today's labs. 7.  Patient to follow in clinic in 6 months with Dr. Lavon Paganini or sooner if necessary.  Hyacinth Meeker, PA-C [facility] 09/27/2023, 2:00 PM  Cc: [name], DO

## 2023-09-29 LAB — AFP TUMOR MARKER: AFP-Tumor Marker: 4.2 ng/mL

## 2023-10-03 ENCOUNTER — Inpatient Hospital Stay: Payer: PPO

## 2023-10-03 NOTE — Telephone Encounter (Signed)
[facility], [facility] called they are missing page 6 that is why it was denied I do not have it in what you sent me.Do you have page 6? If you do please fax it to me Thanks.

## 2023-10-03 NOTE — Telephone Encounter (Signed)
Never mind [facility]le, I printed page 6 online and had Dr Lavon Paganini sign it and re faxed to [phone]

## 2023-10-06 ENCOUNTER — Ambulatory Visit (HOSPITAL_COMMUNITY)
Admission: RE | Admit: 2023-10-06 | Discharge: 2023-10-06 | Disposition: A | Discharge status: Home / Self Care | Payer: PPO | Source: Ambulatory Visit | Attending: Physician Assistant | Admitting: Physician Assistant

## 2023-10-06 ENCOUNTER — Inpatient Hospital Stay: Payer: PPO | Attending: Hematology

## 2023-10-06 VITALS — BP 175/58 | HR 85 | Temp 97.4°F | Resp 18

## 2023-10-06 DIAGNOSIS — R932 Abnormal findings on diagnostic imaging of liver and biliary tract: Secondary | ICD-10-CM | POA: Diagnosis not present

## 2023-10-06 DIAGNOSIS — K7290 Hepatic failure, unspecified without coma: Secondary | ICD-10-CM | POA: Diagnosis not present

## 2023-10-06 DIAGNOSIS — K838 Other specified diseases of biliary tract: Secondary | ICD-10-CM | POA: Diagnosis not present

## 2023-10-06 DIAGNOSIS — K7460 Unspecified cirrhosis of liver: Secondary | ICD-10-CM | POA: Insufficient documentation

## 2023-10-06 DIAGNOSIS — K7689 Other specified diseases of liver: Secondary | ICD-10-CM | POA: Diagnosis not present

## 2023-10-06 DIAGNOSIS — E538 Deficiency of other specified B group vitamins: Secondary | ICD-10-CM | POA: Diagnosis not present

## 2023-10-06 DIAGNOSIS — Z9049 Acquired absence of other specified parts of digestive tract: Secondary | ICD-10-CM | POA: Diagnosis not present

## 2023-10-06 MED ORDER — CYANOCOBALAMIN 1000 MCG/ML IJ SOLN
1000.0000 ug | Freq: Once | INTRAMUSCULAR | Status: AC
  Administered 2023-10-06: 1000 ug via INTRAMUSCULAR
  Filled 2023-10-06: qty 1

## 2023-10-06 NOTE — Patient Instructions (Signed)

## 2023-10-06 NOTE — Progress Notes (Signed)
Patient presents today for B12 injection per providers order.  Vital sign reviewed and BP elevated.  Patient states that she will retake it at home and flollow up with her PCP if still elevated.  Stable during administration without incident; injection site WNL; see MAR for injection details.  Patient tolerated procedure well and without incident.  No questions or complaints noted at this time.

## 2023-10-06 NOTE — Telephone Encounter (Signed)
We have not heard anything back yet- Dr. Isidore Moos just sent the appropriate documents that were missing from the orig request 02/18

## 2023-10-09 ENCOUNTER — Other Ambulatory Visit: Payer: Self-pay

## 2023-10-09 DIAGNOSIS — K7290 Hepatic failure, unspecified without coma: Secondary | ICD-10-CM

## 2023-10-09 DIAGNOSIS — K838 Other specified diseases of biliary tract: Secondary | ICD-10-CM

## 2023-10-09 DIAGNOSIS — R1084 Generalized abdominal pain: Secondary | ICD-10-CM

## 2023-10-09 DIAGNOSIS — K7460 Unspecified cirrhosis of liver: Secondary | ICD-10-CM

## 2023-10-11 ENCOUNTER — Other Ambulatory Visit: Payer: PPO

## 2023-10-11 DIAGNOSIS — R809 Proteinuria, unspecified: Secondary | ICD-10-CM | POA: Diagnosis not present

## 2023-10-11 DIAGNOSIS — D631 Anemia in chronic kidney disease: Secondary | ICD-10-CM | POA: Diagnosis not present

## 2023-10-11 DIAGNOSIS — E211 Secondary hyperparathyroidism, not elsewhere classified: Secondary | ICD-10-CM | POA: Diagnosis not present

## 2023-10-11 DIAGNOSIS — N189 Chronic kidney disease, unspecified: Secondary | ICD-10-CM | POA: Diagnosis not present

## 2023-10-13 ENCOUNTER — Other Ambulatory Visit: Payer: Self-pay | Admitting: Physician Assistant

## 2023-10-13 ENCOUNTER — Telehealth: Payer: Self-pay | Admitting: Physician Assistant

## 2023-10-13 ENCOUNTER — Ambulatory Visit (HOSPITAL_COMMUNITY)
Admission: RE | Admit: 2023-10-13 | Discharge: 2023-10-13 | Disposition: A | Discharge status: Home / Self Care | Payer: PPO | Source: Ambulatory Visit | Attending: Physician Assistant | Admitting: Physician Assistant

## 2023-10-13 DIAGNOSIS — K7290 Hepatic failure, unspecified without coma: Secondary | ICD-10-CM | POA: Insufficient documentation

## 2023-10-13 DIAGNOSIS — K838 Other specified diseases of biliary tract: Secondary | ICD-10-CM

## 2023-10-13 DIAGNOSIS — R1084 Generalized abdominal pain: Secondary | ICD-10-CM | POA: Diagnosis not present

## 2023-10-13 DIAGNOSIS — K7460 Unspecified cirrhosis of liver: Secondary | ICD-10-CM

## 2023-10-13 DIAGNOSIS — K766 Portal hypertension: Secondary | ICD-10-CM | POA: Diagnosis not present

## 2023-10-13 DIAGNOSIS — R161 Splenomegaly, not elsewhere classified: Secondary | ICD-10-CM | POA: Diagnosis not present

## 2023-10-13 MED ORDER — GADOBUTROL 1 MMOL/ML IV SOLN
10.0000 mL | Freq: Once | INTRAVENOUS | Status: AC | PRN
Start: 2023-10-13 — End: 2023-10-13
  Administered 2023-10-13: 10 mL via INTRAVENOUS

## 2023-10-13 NOTE — Telephone Encounter (Signed)
 error

## 2023-10-13 NOTE — Telephone Encounter (Signed)
 Inbound call from patient's daughter, states Stacey Summers has not received the signed form from Dr. Lavon Paganini and is requesting a follow up.

## 2023-10-13 NOTE — Telephone Encounter (Addendum)
 I called pt and left message for [name] the daughter. I have faxed confirmation of the page they was missing which was the shipping information page. It was faxed on 1/15,1/29 and again on 2/18. I strongly advised patient to call them again and get them to go back through their 2/18 faxes. I also received a letter on 2/18 from [facility] stating they received that request. Someone is very delirious at this company. On 2/18 I personally spoke to a lady rep.   and had them on the phone while I faxed the page they needed. Told patients daughter to call me back on Monday    FYI everyone

## 2023-10-20 DIAGNOSIS — K7460 Unspecified cirrhosis of liver: Secondary | ICD-10-CM | POA: Diagnosis not present

## 2023-10-20 DIAGNOSIS — D6959 Other secondary thrombocytopenia: Secondary | ICD-10-CM | POA: Diagnosis not present

## 2023-10-20 DIAGNOSIS — N1832 Chronic kidney disease, stage 3b: Secondary | ICD-10-CM | POA: Diagnosis not present

## 2023-10-20 DIAGNOSIS — I5032 Chronic diastolic (congestive) heart failure: Secondary | ICD-10-CM | POA: Diagnosis not present

## 2023-10-31 ENCOUNTER — Other Ambulatory Visit: Payer: Self-pay | Admitting: Gastroenterology

## 2023-11-14 ENCOUNTER — Inpatient Hospital Stay: Payer: PPO | Attending: Hematology

## 2023-11-14 VITALS — BP 156/61 | HR 80 | Temp 97.5°F | Resp 18

## 2023-11-14 DIAGNOSIS — E538 Deficiency of other specified B group vitamins: Secondary | ICD-10-CM | POA: Diagnosis not present

## 2023-11-14 MED ORDER — CYANOCOBALAMIN 1000 MCG/ML IJ SOLN
1000.0000 ug | Freq: Once | INTRAMUSCULAR | Status: AC
  Administered 2023-11-14: 1000 ug via INTRAMUSCULAR
  Filled 2023-11-14: qty 1

## 2023-11-14 NOTE — Patient Instructions (Signed)

## 2023-11-14 NOTE — Progress Notes (Signed)
 Patient tolerated injection with no complaints voiced.  Site clean and dry with no bruising or swelling noted at site.  See MAR for details.  Band aid applied.  Patient stable during and after injection.  Vss with discharge and left in satisfactory condition with no s/s of distress noted.

## 2023-11-21 NOTE — Progress Notes (Unsigned)
 Subjective: CC:*** PCP: [name], DO [name] is a 78 y.o. female presenting to clinic today for:  1. ***   ROS: Per HPI  Allergies  Allergen Reactions   Latex Other (See Comments)    "my hands feels numb"   Past Medical History:  Diagnosis Date   Allergy    seasonal   Anxiety    Arthritis    hands, back    B12 deficiency 03/31/2021   Cataract    surgery to remove   Chronic back pain    lower back    Cirrhosis of liver without ascites (HCC)    recent dx 07/11/18   Closed fracture of right tibial plateau 01/31/2018   Complex tear of medial meniscus of right knee 03/27/2018   COPD (chronic obstructive pulmonary disease) (HCC)    no inhaler   Diabetes mellitus without complication (HCC)    DJD (degenerative joint disease)    Fibromyalgia    Genetic testing 03/08/2017   Ms. Tetreault underwent genetic counseling and testing for hereditary cancer syndromes on 02/21/2017. Her results were negative for mutations in all 83 genes analyzed by [facility]'s 83-gene Multi-Cancers Panel. 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   History of abnormal mammogram    History of arthroplasty of right knee 07/06/2018   Hyperlipidemia    Hypertension    Irregular heart beat    Neuromuscular disorder (HCC)    fibromyalgia   Sleep apnea    cpap some   Stress fracture of right tibia 03/27/2018   SVD (spontaneous vaginal delivery)    x 2   Tubular adenoma of colon 05/09/12   "innumerable number" of flat polyps    Current Outpatient Medications:    albuterol (VENTOLIN HFA) 108 (90 Base) MCG/ACT inhaler, Inhale 2 puffs into the lungs every 6 (six) hours as needed for wheezing or shortness of breath., Disp: 18 g, Rfl: 1   ALPRAZolam  (XANAX) 0.25 MG tablet, Take 1/2-1 tablet once daily as needed for severe anxiety/ panic attack. May cause hallucinations/ falls, Disp: 30 tablet, Rfl: 0   ascorbic acid (VITAMIN C) 500 MG tablet, Take 500 mg by mouth daily., Disp: , Rfl:    Biotin 5000 MCG SUBL, Place under the tongue., Disp: , Rfl:    Blood Glucose Monitoring Suppl (ONETOUCH VERIO FLEX SYSTEM) w/Device KIT, Check Bs up to 4 times daily Dx E11.9,, Disp: 1 kit, Rfl: 0   Cholecalciferol (VITAMIN D3) 5000 units CAPS, Take 5,000 Units by mouth daily., Disp: , Rfl:    cyclobenzaprine (FLEXERIL) 5 MG tablet, TAKE 1 TABLET BY MOUTH THREE TIMES DAILY AS NEEDED FOR MUSCLE SPASM . APPOINTMENT REQUIRED FOR FUTURE REFILLS, Disp: 30 tablet, Rfl: 0   diltiazem (CARDIZEM CD) 120 MG 24 hr capsule, Take 1 capsule (120 mg total) by mouth every Monday, Wednesday, and Friday., Disp: 45 capsule, Rfl: 2   fluticasone (FLONASE) 50 MCG/ACT nasal spray, Place 2 sprays into both nostrils daily., Disp: 16 g, Rfl: 6   furosemide (LASIX) 40 MG tablet, Take 1 tablet (40 mg total) by mouth 2 (two) times daily., Disp: 270 tablet, Rfl: 2   glucose blood (ONETOUCH VERIO) test strip, Check Bs up to 4 times daily Dx E11.9,, Disp: 400 each, Rfl:  3   hydrocortisone cream 1 %, Apply 1 Application topically 2 (two) times daily. Apply a sparingly (thin film) to facial rash twice daily for up to a week, Disp: 30 g, Rfl: 0   lactulose (CHRONULAC) 10 GM/15ML solution, TAKE 15 ML BY MOUTH  THREE TIMES DAILY AS NEEDED, Disp: 946 mL, Rfl: 0   meclizine (ANTIVERT) 25 MG tablet, TAKE 1 TABLET BY MOUTH THREE TIMES DAILY AS NEEDED FOR DIZZINESS, Disp: 90 tablet, Rfl: 1   Misc Natural Products (BEET ROOT PO), Take 1 Capful by mouth daily., Disp: , Rfl:    ondansetron (ZOFRAN-ODT) 4 MG disintegrating tablet, DISSOLVE 1 TABLET IN MOUTH EVERY 6 HOURS AS NEEDED FOR NAUSEA AND VOMITING, Disp: 20 tablet, Rfl: 0   ONETOUCH DELICA LANCETS 33G MISC, USE ONE LANCET TO CHECK GLUCOSE ONCE DAILY  AND AS NEEDED, Disp: 100 each, Rfl: 0   spironolactone (ALDACTONE) 25 MG tablet, Take 1 tablet by mouth once daily, Disp: 90 tablet, Rfl: 3   spironolactone (ALDACTONE) 50 MG tablet, Take 0.5 tablets (25 mg total) by mouth daily., Disp: 45 tablet, Rfl: 1   XIFAXAN 550 MG TABS tablet, Take 1 tablet (550 mg total) by mouth 2 (two) times daily., Disp: 180 tablet, Rfl: 2 Social History   Socioeconomic History   Marital status: Married    Spouse name: Molly Maduro "goes by [employer]"   Number of children: 3   Years of education: 11   Highest education level: GED or equivalent  Occupational History   Occupation: retired  Tobacco Use   Smoking status: Former    Current packs/day: 0.00    Average packs/day: 1 pack/day for 3.0 years (3.0 ttl pk-yrs)    Types: Cigarettes    Start date: 08/15/1966    Quit date: 08/15/1969    Years since quitting: 54.3   Smokeless tobacco: Never  Vaping Use   Vaping status: Never Used  Substance and Sexual Activity   Alcohol use: No    Alcohol/week: 0.0 standard drinks of alcohol   Drug use: No   Sexual activity: Not Currently  Other Topics Concern   Not on file  Social History Narrative   Daughter lives with them and helps a lot   Social Drivers of Health   Financial Resource Strain: Low Risk  (12/16/2022)   Overall Financial Resource Strain (CARDIA)    Difficulty of Paying Living Expenses: Not hard at all  Food Insecurity: No Food Insecurity (07/04/2023)   Hunger Vital Sign    Worried About Running Out of Food in the Last Year: Never true    Ran Out of Food in the Last Year: Never true  Transportation Needs: No Transportation Needs (07/04/2023)   PRAPARE - Administrator, Civil Service (Medical): No    Lack of Transportation (Non-Medical): No  Physical Activity: Inactive (12/16/2022)   Exercise Vital Sign    Days of Exercise per Week: 0 days    Minutes of Exercise per Session: 0 min  Stress: No Stress Concern Present (12/16/2022)   [employer] of Occupational Health - Occupational Stress Questionnaire    Feeling of Stress : Not at all  Social Connections: Socially Integrated (12/16/2022)   Social Connection and Isolation Panel [NHANES]    Frequency of Communication with Friends and Family: More than three times a week    Frequency of Social Gatherings with Friends and Family: More than three times a week    Attends Religious Services: More than 4 times per year  Active Member of Clubs or Organizations: Yes    Attends Banker Meetings: More than 4 times per year    Marital Status: Married  Catering manager Violence: Not At Risk (07/04/2023)   Humiliation, Afraid, Rape, and Kick questionnaire    Fear of Current or Ex-Partner: No    Emotionally Abused: No    Physically Abused: No    Sexually Abused: No   Family History  Problem Relation Age of Onset   Heart failure Mother        d.37   Heart disease Mother    Prostate cancer Father        d.89 diagnosed in late-60s   Colon cancer Father        diagnosed at 57   Leukemia Maternal Aunt    Heart failure Maternal Uncle        d.50   Stomach cancer Paternal Aunt        questionable   Colon cancer Paternal Uncle        in his 54s   Heart failure Maternal Grandmother        d.45   Kidney cancer Cousin        d.78s maternal first-cousin   Colon polyps Brother        polyposis. Underwent partial colectomy at age 30.   Spina bifida Brother        died at birth   Colon polyps Son 73   Heart attack Son 16   Other Other 16       multiple lipomas   Esophageal cancer Neg Hx    Rectal cancer Neg Hx    Breast cancer Neg Hx     Objective: Office vital signs reviewed. There were no vitals taken for this visit.  Physical Examination:  General: Awake, alert, *** nourished, No acute distress HEENT: Normal    Neck: No masses palpated. No lymphadenopathy    Ears: Tympanic membranes intact, normal light reflex, no erythema, no bulging    Eyes: PERRLA,  extraocular membranes intact, sclera ***    Nose: nasal turbinates moist, *** nasal discharge    Throat: moist mucus membranes, no erythema, *** tonsillar exudate.  Airway is patent Cardio: regular rate and rhythm, S1S2 heard, no murmurs appreciated Pulm: clear to auscultation bilaterally, no wheezes, rhonchi or rales; normal work of breathing on room air GI: soft, non-tender, non-distended, bowel sounds present x4, no hepatomegaly, no splenomegaly, no masses GU: external vaginal tissue ***, cervix ***, *** punctate lesions on cervix appreciated, *** discharge from cervical os, *** bleeding, *** cervical motion tenderness, *** abdominal/ adnexal masses Extremities: warm, well perfused, No edema, cyanosis or clubbing; +*** pulses bilaterally MSK: *** gait and *** station Skin: dry; intact; no rashes or lesions Neuro: *** Strength and light touch sensation grossly intact, *** DTRs ***/4  Assessment/ Plan: 78 y.o. female   Lumbar radiculopathy  Chronic left-sided low back pain with left-sided sciatica  Spondylolisthesis, grade 1  ***   Stacey Summers Skains, DO [facility] [phone]

## 2023-11-22 ENCOUNTER — Telehealth: Payer: Self-pay

## 2023-11-22 ENCOUNTER — Encounter: Payer: Self-pay | Admitting: Family Medicine

## 2023-11-22 ENCOUNTER — Ambulatory Visit (INDEPENDENT_AMBULATORY_CARE_PROVIDER_SITE_OTHER): Admitting: Family Medicine

## 2023-11-22 ENCOUNTER — Ambulatory Visit (INDEPENDENT_AMBULATORY_CARE_PROVIDER_SITE_OTHER)

## 2023-11-22 VITALS — BP 126/60 | HR 50 | Temp 98.4°F | Ht 62.0 in | Wt 236.0 lb

## 2023-11-22 DIAGNOSIS — N1832 Chronic kidney disease, stage 3b: Secondary | ICD-10-CM

## 2023-11-22 DIAGNOSIS — M5416 Radiculopathy, lumbar region: Secondary | ICD-10-CM

## 2023-11-22 DIAGNOSIS — M546 Pain in thoracic spine: Secondary | ICD-10-CM | POA: Diagnosis not present

## 2023-11-22 DIAGNOSIS — E1122 Type 2 diabetes mellitus with diabetic chronic kidney disease: Secondary | ICD-10-CM | POA: Diagnosis not present

## 2023-11-22 DIAGNOSIS — M545 Low back pain, unspecified: Secondary | ICD-10-CM | POA: Diagnosis not present

## 2023-11-22 DIAGNOSIS — G8929 Other chronic pain: Secondary | ICD-10-CM

## 2023-11-22 DIAGNOSIS — R600 Localized edema: Secondary | ICD-10-CM

## 2023-11-22 DIAGNOSIS — M4310 Spondylolisthesis, site unspecified: Secondary | ICD-10-CM

## 2023-11-22 DIAGNOSIS — K7581 Nonalcoholic steatohepatitis (NASH): Secondary | ICD-10-CM

## 2023-11-22 DIAGNOSIS — K7460 Unspecified cirrhosis of liver: Secondary | ICD-10-CM | POA: Diagnosis not present

## 2023-11-22 DIAGNOSIS — M47814 Spondylosis without myelopathy or radiculopathy, thoracic region: Secondary | ICD-10-CM | POA: Diagnosis not present

## 2023-11-22 LAB — BAYER DCA HB A1C WAIVED: HB A1C (BAYER DCA - WAIVED): 5 % (ref 4.8–5.6)

## 2023-11-22 MED ORDER — LIDOCAINE 5 % EX PTCH: MEDICATED_PATCH | CUTANEOUS | 3 refills | Status: DC

## 2023-11-22 MED ORDER — CYCLOBENZAPRINE HCL 5 MG PO TABS: ORAL_TABLET | ORAL | 0 refills | Status: AC

## 2023-11-22 MED ORDER — TRAMADOL HCL 50 MG PO TABS: 50.0000 mg | ORAL_TABLET | Freq: Two times a day (BID) | ORAL | 0 refills | Status: DC | PRN

## 2023-11-22 MED ORDER — METHYLPREDNISOLONE ACETATE 40 MG/ML IJ SUSP
40.0000 mg | Freq: Once | INTRAMUSCULAR | Status: AC
  Administered 2023-11-22: 40 mg via INTRAMUSCULAR

## 2023-11-22 MED ORDER — METHYLPREDNISOLONE ACETATE 40 MG/ML IJ SUSP: 40.0000 mg | Freq: Once | INTRAMUSCULAR | Status: DC

## 2023-11-22 NOTE — Telephone Encounter (Signed)
 Pharmacy Patient Advocate Encounter   Received notification from CoverMyMeds that prior authorization for  Cyclobenzaprine HCl 5MG  tablets is required/requested.   Insurance verification completed.   The patient is insured through [facility] ADVANTAGE/RX ADVANCE .   Per test claim: PA required; PA submitted to above mentioned insurance via CoverMyMeds Key/confirmation #/EOC [facility] Status is pending

## 2023-11-23 ENCOUNTER — Other Ambulatory Visit: Payer: Self-pay | Admitting: Family Medicine

## 2023-11-23 DIAGNOSIS — N179 Acute kidney failure, unspecified: Secondary | ICD-10-CM

## 2023-11-23 DIAGNOSIS — R7989 Other specified abnormal findings of blood chemistry: Secondary | ICD-10-CM

## 2023-11-23 LAB — CBC
Hematocrit: 38.9 % (ref 34.0–46.6)
Hemoglobin: 13.7 g/dL (ref 11.1–15.9)
MCH: 34.4 pg — ABNORMAL HIGH (ref 26.6–33.0)
MCHC: 35.2 g/dL (ref 31.5–35.7)
MCV: 98 fL — ABNORMAL HIGH (ref 79–97)
Platelets: 101 10*3/uL — ABNORMAL LOW (ref 150–450)
RBC: 3.98 x10E6/uL (ref 3.77–5.28)
RDW: 12.2 % (ref 11.7–15.4)
WBC: 7.9 10*3/uL (ref 3.4–10.8)

## 2023-11-23 LAB — CMP14+EGFR
ALT: 25 IU/L (ref 0–32)
AST: 45 IU/L — ABNORMAL HIGH (ref 0–40)
Albumin: 3.3 g/dL — ABNORMAL LOW (ref 3.8–4.8)
Alkaline Phosphatase: 140 IU/L — ABNORMAL HIGH (ref 44–121)
BUN/Creatinine Ratio: 18 (ref 12–28)
BUN: 26 mg/dL (ref 8–27)
Bilirubin Total: 2.8 mg/dL — ABNORMAL HIGH (ref 0.0–1.2)
CO2: 27 mmol/L (ref 20–29)
Calcium: 9.9 mg/dL (ref 8.7–10.3)
Chloride: 100 mmol/L (ref 96–106)
Creatinine, Ser: 1.41 mg/dL — ABNORMAL HIGH (ref 0.57–1.00)
Globulin, Total: 2.7 g/dL (ref 1.5–4.5)
Glucose: 134 mg/dL — ABNORMAL HIGH (ref 70–99)
Potassium: 4.2 mmol/L (ref 3.5–5.2)
Sodium: 141 mmol/L (ref 134–144)
Total Protein: 6 g/dL (ref 6.0–8.5)
eGFR: 39 mL/min/{1.73_m2} — ABNORMAL LOW (ref >59)

## 2023-11-23 LAB — TSH+FREE T4
Free T4: 1.3 ng/dL (ref 0.82–1.77)
TSH: 8.09 u[IU]/mL — ABNORMAL HIGH (ref 0.450–4.500)

## 2023-11-23 LAB — BRAIN NATRIURETIC PEPTIDE: BNP: 379.8 pg/mL — ABNORMAL HIGH (ref 0.0–100.0)

## 2023-11-23 MED ORDER — LEVOTHYROXINE SODIUM 25 MCG PO TABS
25.0000 ug | ORAL_TABLET | Freq: Every day | ORAL | 3 refills | Status: AC
Start: 2023-11-23 — End: ?

## 2023-11-23 NOTE — Telephone Encounter (Signed)
 Pharmacy Patient Advocate Encounter  Received notification from [facility] ADVANTAGE/RX ADVANCE that Prior Authorization for Cyclobenzaprine 5mg  has been DENIED.  Full denial letter will be uploaded to the media tab. See denial reason below.

## 2023-11-23 NOTE — Telephone Encounter (Signed)
 Appeal has been submitted. Will advise when response is received or follow up in 1 week. Please be advised that most companies may take 30 days to make a decision.   Phone# [phone]  Case Reference# [id number]  Original PA had a dx of Fibromyalgia and used muscle spasm (M62.83) for the appeal.

## 2023-11-25 ENCOUNTER — Other Ambulatory Visit: Payer: Self-pay | Admitting: Gastroenterology

## 2023-11-27 ENCOUNTER — Telehealth: Payer: Self-pay

## 2023-11-27 ENCOUNTER — Other Ambulatory Visit (HOSPITAL_COMMUNITY): Payer: Self-pay

## 2023-11-27 NOTE — Telephone Encounter (Signed)
 Pharmacy Patient Advocate Encounter   Received notification from CoverMyMeds that prior authorization for Lidocaine 5% patchesis required/requested.   Insurance verification completed.   The patient is insured through [facility] ADVANTAGE/RX ADVANCE .   Per test claim: PA required; PA started via CoverMyMeds. KEY [id number] . Waiting for clinical questions to populate.

## 2023-11-29 ENCOUNTER — Ambulatory Visit: Payer: PPO | Admitting: Family Medicine

## 2023-11-29 ENCOUNTER — Other Ambulatory Visit: Payer: Self-pay

## 2023-11-29 ENCOUNTER — Telehealth: Payer: Self-pay

## 2023-11-29 DIAGNOSIS — M546 Pain in thoracic spine: Secondary | ICD-10-CM

## 2023-11-29 NOTE — Telephone Encounter (Signed)
 Per Dr. Bonnell Butcher cancelled appt for today for pt since she had labs last week and problem discussed during that time as well.  Informed daughter that pt needs repeat labs based off of last results. Future orders have been placed and an appt is scheduled for 5/8 at 11. Daughter would like for ortho referral to be placed since pt is in so much pain still. Urgent referral placed today. Dr. Bonnell Butcher has been made aware.

## 2023-11-29 NOTE — Telephone Encounter (Signed)
 Clinical questions have been answered and PA submitted. PA currently Pending.

## 2023-11-30 NOTE — Telephone Encounter (Signed)
 Pharmacy Patient Advocate Encounter  Received notification from [facility] ADVANTAGE/RX ADVANCE that Prior Authorization for Lidocaine 5% patches has been DENIED.  Full denial letter will be uploaded to the media tab. See denial reason below.   PA #/Case ID/Reference #: [id number]

## 2023-12-04 ENCOUNTER — Encounter: Payer: Self-pay | Admitting: *Deleted

## 2023-12-04 NOTE — Telephone Encounter (Signed)
 Please advise to get OTC 4% lidocaine  patches since ins will not cover

## 2023-12-12 ENCOUNTER — Ambulatory Visit: Admitting: Physical Medicine and Rehabilitation

## 2023-12-14 ENCOUNTER — Other Ambulatory Visit: Payer: Self-pay | Admitting: Gastroenterology

## 2023-12-21 ENCOUNTER — Other Ambulatory Visit

## 2023-12-21 DIAGNOSIS — N179 Acute kidney failure, unspecified: Secondary | ICD-10-CM

## 2023-12-21 DIAGNOSIS — R7989 Other specified abnormal findings of blood chemistry: Secondary | ICD-10-CM

## 2023-12-22 LAB — TSH+FREE T4
Free T4: 1.31 ng/dL (ref 0.82–1.77)
TSH: 5.23 u[IU]/mL — ABNORMAL HIGH (ref 0.450–4.500)

## 2023-12-22 LAB — BASIC METABOLIC PANEL WITH GFR
BUN/Creatinine Ratio: 26 (ref 12–28)
BUN: 38 mg/dL — ABNORMAL HIGH (ref 8–27)
CO2: 28 mmol/L (ref 20–29)
Calcium: 10.3 mg/dL (ref 8.7–10.3)
Chloride: 98 mmol/L (ref 96–106)
Creatinine, Ser: 1.46 mg/dL — ABNORMAL HIGH (ref 0.57–1.00)
Glucose: 128 mg/dL — ABNORMAL HIGH (ref 70–99)
Potassium: 4.7 mmol/L (ref 3.5–5.2)
Sodium: 140 mmol/L (ref 134–144)
eGFR: 37 mL/min/{1.73_m2} — ABNORMAL LOW (ref >59)

## 2023-12-25 ENCOUNTER — Ambulatory Visit: Admitting: Family Medicine

## 2023-12-25 ENCOUNTER — Other Ambulatory Visit: Payer: Self-pay | Admitting: Family Medicine

## 2023-12-25 DIAGNOSIS — F411 Generalized anxiety disorder: Secondary | ICD-10-CM

## 2023-12-25 DIAGNOSIS — M545 Low back pain, unspecified: Secondary | ICD-10-CM

## 2023-12-26 ENCOUNTER — Inpatient Hospital Stay: Payer: PPO

## 2023-12-27 ENCOUNTER — Other Ambulatory Visit: Payer: Self-pay | Admitting: Family Medicine

## 2023-12-27 DIAGNOSIS — M546 Pain in thoracic spine: Secondary | ICD-10-CM

## 2023-12-28 ENCOUNTER — Inpatient Hospital Stay: Attending: Hematology

## 2023-12-28 VITALS — BP 150/70 | Temp 98.1°F | Resp 18

## 2023-12-28 DIAGNOSIS — E538 Deficiency of other specified B group vitamins: Secondary | ICD-10-CM | POA: Diagnosis not present

## 2023-12-28 MED ORDER — CYANOCOBALAMIN 1000 MCG/ML IJ SOLN
1000.0000 ug | Freq: Once | INTRAMUSCULAR | Status: AC
  Administered 2023-12-28: 1000 ug via INTRAMUSCULAR
  Filled 2023-12-28: qty 1

## 2023-12-28 NOTE — Patient Instructions (Signed)
 [facility] - A DEPT OF [facility]  Discharge Instructions: Thank you for choosing [facility] to provide your oncology and hematology care.  If you have a lab appointment with the Cancer Center - please note that after April 8th, 2024, all labs will be drawn in the cancer center.  You do not have to check in or register with the main entrance as you have in the past but will complete your check-in in the cancer center.  Wear comfortable clothing and clothing appropriate for easy access to any Portacath or PICC line.   We strive to give you quality time with your provider. You may need to reschedule your appointment if you arrive late (15 or more minutes).  Arriving late affects you and other patients whose appointments are after yours.  Also, if you miss three or more appointments without notifying the office, you may be dismissed from the clinic at the provider's discretion.      For prescription refill requests, have your pharmacy contact our office and allow 72 hours for refills to be completed.    Today you received the following chemotherapy and/or immunotherapy agents Vitamin b12 injection Vitamin B12 Injection What is this medication? Vitamin B12 (VAHY tuh min B12) prevents and treats low vitamin B12 levels in your body. It is used in people who do not get enough vitamin B12 from their diet or when their digestive tract does not absorb enough. Vitamin B12 plays an important role in maintaining the health of your nervous system and red blood cells. This medicine may be used for other purposes; ask your health care provider or pharmacist if you have questions. COMMON BRAND NAME(S): B-12 Compliance Kit, B-12 Injection Kit, Cyomin, Dodex , LA-12, Nutri-Twelve, Physicians EZ Use B-12, Primabalt, Vitamin Deficiency Injectable System - B12 What should I tell my care team before I take this medication? They need to know if you have any of these  conditions: Kidney disease Leber's disease Megaloblastic anemia An unusual or allergic reaction to cyanocobalamin , cobalt, other medications, foods, dyes, or preservatives Pregnant or trying to get pregnant Breast-feeding How should I use this medication? This medication is injected into a muscle or deeply under the skin. It is usually given in a clinic or care team's office. However, your care team may teach you how to inject yourself. Follow all instructions. Talk to your care team about the use of this medication in children. Special care may be needed. Overdosage: If you think you have taken too much of this medicine contact a poison control center or emergency room at once. NOTE: This medicine is only for you. Do not share this medicine with others. What if I miss a dose? If you are given your dose at a clinic or care team's office, call to reschedule your appointment. If you give your own injections, and you miss a dose, take it as soon as you can. If it is almost time for your next dose, take only that dose. Do not take double or extra doses. What may interact with this medication? Alcohol Colchicine This list may not describe all possible interactions. Give your health care provider a list of all the medicines, herbs, non-prescription drugs, or dietary supplements you use. Also tell them if you smoke, drink alcohol, or use illegal drugs. Some items may interact with your medicine. What should I watch for while using this medication? Visit your care team regularly. You may need blood work done  while you are taking this medication. You may need to follow a special diet. Talk to your care team. Limit your alcohol intake and avoid smoking to get the best benefit. What side effects may I notice from receiving this medication? Side effects that you should report to your care team as soon as possible: Allergic reactions--skin rash, itching, hives, swelling of the face, lips, tongue, or  throat Swelling of the ankles, hands, or feet Trouble breathing Side effects that usually do not require medical attention (report to your care team if they continue or are bothersome): Diarrhea This list may not describe all possible side effects. Call your doctor for medical advice about side effects. You may report side effects to FDA at 1-800-FDA-1088. Where should I keep my medication? Keep out of the reach of children. Store at room temperature between 15 and 30 degrees C (59 and 85 degrees F). Protect from light. Throw away any unused medication after the expiration date. NOTE: This sheet is a summary. It may not cover all possible information. If you have questions about this medicine, talk to your doctor, pharmacist, or health care provider.  2024 Elsevier/Gold Standard (2021-04-13 00:00:00)      To help prevent nausea and vomiting after your treatment, we encourage you to take your nausea medication as directed.  BELOW ARE SYMPTOMS THAT SHOULD BE REPORTED IMMEDIATELY: *FEVER GREATER THAN 100.4 F (38 C) OR HIGHER *CHILLS OR SWEATING *NAUSEA AND VOMITING THAT IS NOT CONTROLLED WITH YOUR NAUSEA MEDICATION *UNUSUAL SHORTNESS OF BREATH *UNUSUAL BRUISING OR BLEEDING *URINARY PROBLEMS (pain or burning when urinating, or frequent urination) *BOWEL PROBLEMS (unusual diarrhea, constipation, pain near the anus) TENDERNESS IN MOUTH AND THROAT WITH OR WITHOUT PRESENCE OF ULCERS (sore throat, sores in mouth, or a toothache) UNUSUAL RASH, SWELLING OR PAIN  UNUSUAL VAGINAL DISCHARGE OR ITCHING   Items with * indicate a potential emergency and should be followed up as soon as possible or go to the Emergency Department if any problems should occur.  Please show the CHEMOTHERAPY ALERT CARD or IMMUNOTHERAPY ALERT CARD at check-in to the Emergency Department and triage nurse.  Should you have questions after your visit or need to cancel or reschedule your appointment, please contact [facility]  [facility] [place] - A DEPT OF [facility] [phone]  and follow the prompts.  Office hours are 8:00 a.m. to 4:30 p.m. Monday - Friday. Please note that voicemails left after 4:00 p.m. may not be returned until the following business day.  We are closed weekends and major holidays. You have access to a nurse at all times for urgent questions. Please call the main number to the clinic [phone] and follow the prompts.  For any non-urgent questions, you may also contact your provider using MyChart. We now offer e-Visits for anyone 55 and older to request care online for non-urgent symptoms. For details visit [email or web address].   Also download the MyChart app! Go to the app store, search "MyChart", open the app, select [place], and log in with your MyChart username and password.

## 2023-12-28 NOTE — Progress Notes (Signed)
 Stacey Summers presents today for injection per the provider's orders.  Vitamin B12 administration without incident; injection site WNL; see MAR for injection details.  Patient tolerated procedure well and without incident.  No questions or complaints noted at this time.  Discharged from clinic by wheel chair in stable condition. Alert and oriented x 3. F/U with [facility] as scheduled.

## 2024-01-05 DIAGNOSIS — H40033 Anatomical narrow angle, bilateral: Secondary | ICD-10-CM | POA: Diagnosis not present

## 2024-01-05 DIAGNOSIS — H2513 Age-related nuclear cataract, bilateral: Secondary | ICD-10-CM | POA: Diagnosis not present

## 2024-01-05 LAB — HM DIABETES EYE EXAM

## 2024-01-12 ENCOUNTER — Encounter: Payer: Self-pay | Admitting: Family Medicine

## 2024-01-12 ENCOUNTER — Ambulatory Visit (INDEPENDENT_AMBULATORY_CARE_PROVIDER_SITE_OTHER): Admitting: Family Medicine

## 2024-01-12 VITALS — BP 143/68 | HR 60 | Temp 98.3°F | Ht 62.0 in | Wt 234.0 lb

## 2024-01-12 DIAGNOSIS — K7581 Nonalcoholic steatohepatitis (NASH): Secondary | ICD-10-CM

## 2024-01-12 DIAGNOSIS — M546 Pain in thoracic spine: Secondary | ICD-10-CM | POA: Diagnosis not present

## 2024-01-12 DIAGNOSIS — R7989 Other specified abnormal findings of blood chemistry: Secondary | ICD-10-CM | POA: Diagnosis not present

## 2024-01-12 DIAGNOSIS — F411 Generalized anxiety disorder: Secondary | ICD-10-CM

## 2024-01-12 DIAGNOSIS — N1832 Chronic kidney disease, stage 3b: Secondary | ICD-10-CM | POA: Diagnosis not present

## 2024-01-12 DIAGNOSIS — R600 Localized edema: Secondary | ICD-10-CM

## 2024-01-12 DIAGNOSIS — K7460 Unspecified cirrhosis of liver: Secondary | ICD-10-CM

## 2024-01-12 DIAGNOSIS — M545 Low back pain, unspecified: Secondary | ICD-10-CM | POA: Diagnosis not present

## 2024-01-12 MED ORDER — TRAMADOL HCL 50 MG PO TABS: 50.0000 mg | ORAL_TABLET | Freq: Two times a day (BID) | ORAL | 0 refills | Status: DC | PRN

## 2024-01-12 MED ORDER — ALPRAZOLAM 0.25 MG PO TABS: ORAL_TABLET | ORAL | 0 refills | Status: AC

## 2024-01-12 NOTE — Progress Notes (Signed)
 Subjective: CC: Follow-up hypothyroidism, edema PCP: Stacey Guerin, DO [name] is a 78 y.o. female who is accompanied today by her granddaughter.  She is presenting to clinic today for:  1.  Edema, thought to be secondary to cirrhosis and CKD 3b Patient continues to take Lasix  and spironolactone  as directed.  She is intermittently compliant with lymphatic pumps but notes that she often needs help using them so is often deterred from asking her family because she does not want to be a bother.  She is able to get them off herself but can never put them on independently.  She does not report any shortness of breath and does feel like her fluid on the legs have gotten better.  She has been compliant with the Synthroid  and all medications as prescribed.  Panic attacks: Would like to have her alprazolam  renewed for as needed use.  She has been taking 0.125 to 0.25 mg daily as needed with last refill back in the beginning of 2024.  Also needs a refill on tramadol  that has helped with pain.  She tries to use this sparingly.  Denies any excessive daytime sedation or falls  ROS: Per HPI  Allergies  Allergen Reactions   Latex Other (See Comments)    "my hands feels numb"   Past Medical History:  Diagnosis Date   Allergy    seasonal   Anxiety    Arthritis    hands, back    B12 deficiency 03/31/2021   Cataract    surgery to remove   Chronic back pain    lower back    Cirrhosis of liver without ascites (HCC)    recent dx 07/11/18   Closed fracture of right tibial plateau 01/31/2018   Complex tear of medial meniscus of right knee 03/27/2018   COPD (chronic obstructive pulmonary disease) (HCC)    no inhaler   Diabetes mellitus without complication (HCC)    DJD (degenerative joint disease)    Fibromyalgia    Genetic testing 03/08/2017   Stacey Summers underwent genetic counseling and testing for hereditary cancer syndromes on 02/21/2017. Her results were negative for  mutations in all 83 genes analyzed by [facility]'s 83-gene Multi-Cancers Panel. 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   History of abnormal mammogram    History of arthroplasty of right knee 07/06/2018   Hyperlipidemia    Hypertension    Irregular heart beat    Neuromuscular disorder (HCC)    fibromyalgia   Sleep apnea    cpap some   Stress fracture of right tibia 03/27/2018   SVD (spontaneous vaginal delivery)    x 2   Tubular adenoma of colon 05/09/12   "innumerable number" of flat polyps    Current Outpatient Medications:    albuterol  (VENTOLIN  HFA) 108 (90 Base) MCG/ACT inhaler, Inhale 2 puffs into the lungs every 6 (six) hours as needed for wheezing or shortness of breath., Disp: 18 g, Rfl: 1   ALPRAZolam  (XANAX ) 0.25 MG tablet, Take 1/2-1 tablet once daily as needed for severe anxiety/ panic attack. May cause hallucinations/ falls, Disp: 30 tablet, Rfl: 0   ascorbic acid (VITAMIN C) 500 MG tablet, Take 500 mg by mouth daily., Disp: , Rfl:    Biotin 5000 MCG  SUBL, Place under the tongue., Disp: , Rfl:    Blood Glucose Monitoring Suppl (ONETOUCH VERIO FLEX SYSTEM) w/Device KIT, Check Bs up to 4 times daily Dx E11.9,, Disp: 1 kit, Rfl: 0   Cholecalciferol (VITAMIN D3) 5000 units CAPS, Take 5,000 Units by mouth daily., Disp: , Rfl:    cyclobenzaprine  (FLEXERIL ) 5 MG tablet, TAKE 1 TABLET BY MOUTH THREE TIMES DAILY AS NEEDED FOR MUSCLE SPASM . APPOINTMENT REQUIRED FOR FUTURE REFILLS, Disp: 30 tablet, Rfl: 0   diltiazem  (CARDIZEM  CD) 120 MG 24 hr capsule, Take 1 capsule (120 mg total) by mouth every Monday, Wednesday, and Friday., Disp: 45 capsule, Rfl: 2   fluticasone  (FLONASE ) 50 MCG/ACT nasal spray, Place 2 sprays into both nostrils daily., Disp: 16 g,  Rfl: 6   furosemide  (LASIX ) 40 MG tablet, Take 1 tablet (40 mg total) by mouth 2 (two) times daily., Disp: 270 tablet, Rfl: 2   glucose blood (ONETOUCH VERIO) test strip, Check Bs up to 4 times daily Dx E11.9,, Disp: 400 each, Rfl: 3   hydrocortisone  cream 1 %, Apply 1 Application topically 2 (two) times daily. Apply a sparingly (thin film) to facial rash twice daily for up to a week, Disp: 30 g, Rfl: 0   lactulose  (CHRONULAC ) 10 GM/15ML solution, TAKE 15 ML BY MOUTH  THREE TIMES DAILY AS NEEDED, Disp: 946 mL, Rfl: 0   levothyroxine  (SYNTHROID ) 25 MCG tablet, Take 1 tablet (25 mcg total) by mouth daily., Disp: 90 tablet, Rfl: 3   lidocaine  (LIDODERM ) 5 %, Apply 1 patch to area of pain daily as needed. Remove & Discard patch within 12 hours or as directed by MD, Disp: 90 patch, Rfl: 3   meclizine  (ANTIVERT ) 25 MG tablet, TAKE 1 TABLET BY MOUTH THREE TIMES DAILY AS NEEDED FOR DIZZINESS, Disp: 90 tablet, Rfl: 1   Misc Natural Products (BEET ROOT PO), Take 1 Capful by mouth daily., Disp: , Rfl:    ondansetron  (ZOFRAN -ODT) 4 MG disintegrating tablet, DISSOLVE 1 TABLET IN MOUTH EVERY 6 HOURS AS NEEDED FOR NAUSEA AND VOMITING, Disp: 20 tablet, Rfl: 0   ONETOUCH DELICA LANCETS 33G MISC, USE ONE LANCET TO CHECK GLUCOSE ONCE DAILY AND AS NEEDED, Disp: 100 each, Rfl: 0   spironolactone  (ALDACTONE ) 25 MG tablet, Take 1 tablet by mouth once daily, Disp: 90 tablet, Rfl: 3   spironolactone  (ALDACTONE ) 50 MG tablet, Take 0.5 tablets (25 mg total) by mouth daily., Disp: 45 tablet, Rfl: 1   traMADol  (ULTRAM ) 50 MG tablet, Take 1 tablet (50 mg total) by mouth every 12 (twelve) hours as needed., Disp: 10 tablet, Rfl: 0   XIFAXAN  550 MG TABS tablet, Take 1 tablet (550 mg total) by mouth 2 (two) times daily., Disp: 180 tablet, Rfl: 2 Social History   Socioeconomic History   Marital status: Married    Spouse name: Stacey Summers "goes by [employer]"   Number of children: 3   Years of education: 11   Highest education level: GED  or equivalent  Occupational History   Occupation: retired  Tobacco Use   Smoking status: Former    Current packs/day: 0.00    Average packs/day: 1 pack/day for 3.0 years (3.0 ttl pk-yrs)    Types: Cigarettes    Start date: 08/15/1966    Quit date: 08/15/1969    Years since quitting: 54.4   Smokeless tobacco: Never  Vaping Use   Vaping status: Never Used  Substance and Sexual Activity   Alcohol use: No    Alcohol/week: 0.0  standard drinks of alcohol   Drug use: No   Sexual activity: Not Currently  Other Topics Concern   Not on file  Social History Narrative   Daughter lives with them and helps a lot   Social Drivers of Health   Financial Resource Strain: Low Risk  (12/16/2022)   Overall Financial Resource Strain (CARDIA)    Difficulty of Paying Living Expenses: Not hard at all  Food Insecurity: No Food Insecurity (07/04/2023)   Hunger Vital Sign    Worried About Running Out of Food in the Last Year: Never true    Ran Out of Food in the Last Year: Never true  Transportation Needs: No Transportation Needs (07/04/2023)   PRAPARE - Administrator, Civil Service (Medical): No    Lack of Transportation (Non-Medical): No  Physical Activity: Inactive (12/16/2022)   Exercise Vital Sign    Days of Exercise per Week: 0 days    Minutes of Exercise per Session: 0 min  Stress: No Stress Concern Present (12/16/2022)   [employer] of Occupational Health - Occupational Stress Questionnaire    Feeling of Stress : Not at all  Social Connections: Socially Integrated (12/16/2022)   Social Connection and Isolation Panel [NHANES]    Frequency of Communication with Friends and Family: More than three times a week    Frequency of Social Gatherings with Friends and Family: More than three times a week    Attends Religious Services: More than 4 times per year    Active Member of [employer] or Organizations: Yes    Attends Engineer, structural: More than 4 times per year    Marital  Status: Married  Catering manager Violence: Not At Risk (07/04/2023)   Humiliation, Afraid, Rape, and Kick questionnaire    Fear of Current or Ex-Partner: No    Emotionally Abused: No    Physically Abused: No    Sexually Abused: No   Family History  Problem Relation Age of Onset   Heart failure Mother        d.37   Heart disease Mother    Prostate cancer Father        d.89 diagnosed in late-60s   Colon cancer Father        diagnosed at 33   Leukemia Maternal Aunt    Heart failure Maternal Uncle        d.50   Stomach cancer Paternal Aunt        questionable   Colon cancer Paternal Uncle        in his 36s   Heart failure Maternal Grandmother        d.45   Kidney cancer Cousin        d.78s maternal first-cousin   Colon polyps Brother        polyposis. Underwent partial colectomy at age 23.   Spina bifida Brother        died at birth   Colon polyps Son 86   Heart attack Son 38   Other Other 16       multiple lipomas   Esophageal cancer Neg Hx    Rectal cancer Neg Hx    Breast cancer Neg Hx     Objective: Office vital signs reviewed. BP (!) 143/68   Pulse 60   Temp 98.3 F (36.8 C)   Ht 5\' 2"  (1.575 m)   Wt 234 lb (106.1 kg)   SpO2 98%   BMI 42.80 kg/m   Physical Examination:  General: Awake, alert, morbidly obese female, No acute distress HEENT: Sclera white.  Moist mucous membranes Cardio: regular rate and rhythm, S1S2 heard, soft murmurs appreciated Pulm: clear to auscultation bilaterally, no wheezes, rhonchi or rales; normal work of breathing on room air Extremities: Trace to +1 pitting edema to ankles bilaterally  Assessment/ Plan: 78 y.o. female   Liver cirrhosis secondary to NASH (HCC) - Plan: CMP14+EGFR  CKD stage 3b, GFR 30-44 ml/min (HCC) - Plan: CMP14+EGFR  Bilateral leg edema - Plan: CMP14+EGFR  Elevated TSH - Plan: TSH + free T4  GAD (generalized anxiety disorder) - Plan: ALPRAZolam  (XANAX ) 0.25 MG tablet, Drug Screen 10 W/Conf,  Se  Thoracolumbar back pain - Plan: traMADol  (ULTRAM ) 50 MG tablet, Drug Screen 10 W/Conf, Se  Recheck renal function, liver enzymes and thyroid  levels.  We reviewed her imaging and labs that were obtained by gastroenterology  Alprazolam  renewed for sparing use as is tramadol  for sparing use.  Avoid use in same day of both.  UDS and CSA were updated as per office policy and the national narcotic database reviewed with no red flags   Stacey Laumann Bambi Bonine, DO [facility] [phone]

## 2024-01-15 ENCOUNTER — Telehealth: Payer: Self-pay | Admitting: Family Medicine

## 2024-01-15 ENCOUNTER — Ambulatory Visit: Payer: Self-pay | Admitting: Family Medicine

## 2024-01-15 NOTE — Telephone Encounter (Signed)
Pt r/c about labs 

## 2024-01-15 NOTE — Telephone Encounter (Signed)
 Copied from CRM [id number]. Topic: Clinical - Lab/Test Results >> Jan 15, 2024  9:25 AM Stacey Summers wrote: Reason for CRM: Patient called in would like for someone to give her a callback regarding the lab results from last week

## 2024-01-15 NOTE — Telephone Encounter (Signed)
 Called pt , went over labs with pt no further questions

## 2024-01-18 LAB — DRUG SCREEN 10 W/CONF, SERUM

## 2024-01-18 LAB — CMP14+EGFR
ALT: 24 IU/L (ref 0–32)
AST: 46 IU/L — ABNORMAL HIGH (ref 0–40)
Albumin: 3.2 g/dL — ABNORMAL LOW (ref 3.8–4.8)
Alkaline Phosphatase: 146 IU/L — ABNORMAL HIGH (ref 44–121)
BUN/Creatinine Ratio: 19 (ref 12–28)
BUN: 25 mg/dL (ref 8–27)
Bilirubin Total: 2.9 mg/dL — ABNORMAL HIGH (ref 0.0–1.2)
CO2: 27 mmol/L (ref 20–29)
Calcium: 9 mg/dL (ref 8.7–10.3)
Chloride: 99 mmol/L (ref 96–106)
Creatinine, Ser: 1.32 mg/dL — ABNORMAL HIGH (ref 0.57–1.00)
Globulin, Total: 2.3 g/dL (ref 1.5–4.5)
Glucose: 191 mg/dL — ABNORMAL HIGH (ref 70–99)
Potassium: 3.7 mmol/L (ref 3.5–5.2)
Sodium: 140 mmol/L (ref 134–144)
Total Protein: 5.5 g/dL — ABNORMAL LOW (ref 6.0–8.5)
eGFR: 42 mL/min/{1.73_m2} — ABNORMAL LOW (ref >59)

## 2024-01-18 LAB — TSH+FREE T4
Free T4: 1.47 ng/dL (ref 0.82–1.77)
TSH: 5.71 u[IU]/mL — ABNORMAL HIGH (ref 0.450–4.500)

## 2024-01-24 ENCOUNTER — Encounter: Payer: Self-pay | Admitting: Cardiovascular Disease

## 2024-01-30 ENCOUNTER — Telehealth: Payer: Self-pay | Admitting: Physician Assistant

## 2024-01-30 MED ORDER — LACTULOSE 10 GM/15ML PO SOLN: 10.0000 g | Freq: Three times a day (TID) | ORAL | 3 refills | Status: DC

## 2024-01-30 NOTE — Telephone Encounter (Signed)
 Left message for patient's daughter to call office. Need to confirm pharmacy before sending medication.

## 2024-01-30 NOTE — Telephone Encounter (Signed)
 Refill sent to pharmacy.

## 2024-01-30 NOTE — Telephone Encounter (Signed)
 Patient daughter Stacey Summers called and stated that her mother is needing a refill on her Lactulose . Looking into the patient med list I did not see this medication. Daughter stated that this medication was prescribed to her mother here. Patient daughter is requesting the Lactulose  be sent over to [facility] pharmacy in [place]. Patinet daughter would like a call back stating the medication has been sent over. Please advise.

## 2024-01-30 NOTE — Telephone Encounter (Signed)
 error

## 2024-02-06 ENCOUNTER — Inpatient Hospital Stay: Attending: Hematology

## 2024-02-06 ENCOUNTER — Inpatient Hospital Stay: Payer: PPO

## 2024-02-06 ENCOUNTER — Encounter: Payer: Self-pay | Admitting: Physician Assistant

## 2024-02-06 ENCOUNTER — Ambulatory Visit: Admitting: Physician Assistant

## 2024-02-06 VITALS — BP 154/75 | HR 63 | Temp 96.6°F | Resp 20

## 2024-02-06 VITALS — BP 136/78 | HR 54 | Ht 61.0 in | Wt 243.4 lb

## 2024-02-06 DIAGNOSIS — R600 Localized edema: Secondary | ICD-10-CM

## 2024-02-06 DIAGNOSIS — Z8719 Personal history of other diseases of the digestive system: Secondary | ICD-10-CM | POA: Diagnosis not present

## 2024-02-06 DIAGNOSIS — E538 Deficiency of other specified B group vitamins: Secondary | ICD-10-CM | POA: Insufficient documentation

## 2024-02-06 DIAGNOSIS — K7460 Unspecified cirrhosis of liver: Secondary | ICD-10-CM

## 2024-02-06 DIAGNOSIS — R5383 Other fatigue: Secondary | ICD-10-CM

## 2024-02-06 MED ORDER — CYANOCOBALAMIN 1000 MCG/ML IJ SOLN
1000.0000 ug | Freq: Once | INTRAMUSCULAR | Status: AC
  Administered 2024-02-06: 1000 ug via INTRAMUSCULAR
  Filled 2024-02-06: qty 1

## 2024-02-06 NOTE — Progress Notes (Signed)
 Chief Complaint: Follow-up decompensated cirrhosis  HPI:    Mrs. Stacey Summers is a 78 year old female, known to Dr. Shila, with a past medical history as listed below including NASH cirrhosis, COPD and multiple others, who returns to clinic today for follow-up of her decompensated cirrhosis.       08/07/2018 colonoscopy with four 4-9 mm polyps in the transverse and ascending colon as well as diverticulosis in the sigmoid colon nonbleeding internal hemorrhoids.  Recall discontinued due to age and comorbidities.    08/20/2019 EGD with grade 1 and small less than 5 mm varices and portal hypertensive gastropathy.    06/30/2022 MRI of the abdomen with and without contrast showed nodular cirrhotic morphology of the liver.  Very narrowed appearance of the portal vein.  Status post cholecystectomy and trace perihepatic ascites.    07/01/2023-07/03/2023 patient admitted to the hospital for sepsis.  Also had anasarca.  She was admitted for altered mentation and fevers.  Diagnosed with an upper respiratory infection and started on Cefepime  and Vancomycin .  Blood cultures negative.  Discharged home on Augmentin .  Noted to have chronic diastolic dysfunction CHF.  Echo 07/02/2023 with an EF of 55-60% with mild concentric LVH.  Patient developed AKI with IV Lasix .  Continued on Lactulose  and Rifaximin  at discharge.  Told to use Cardizem  Monday Wednesday Friday.    09/04/2023 patient had a video visit with her PCP for a cough and fever.  PCP notified us  due to some low hemoglobin.  Patient was not describing any GI bleed.  Dr. Shila reviewed and saw no significant change in CBC or CMP.    09/04/2023 CBC stable.  Platelets now at 92.  Ammonia level minimally elevated at 69 from 14 on 07/01/2023.    09/27/2023 office visit with me.  At that time had admission for sepsis and a URI.  At that time having lower extremity swelling with diuretics managed by nephrologist.  Had occasional left upper quadrant abdominal pain and  lactulose  seems to be working for her.  At that point updated labs including CBC, CMP, PT/INR and AFP, ordered right upper quadrant ultrasound for [facility] surveillance.  We are working on Xifaxan .  Continued on Lactulose  3 times daily and continued on Aldactone  at her discretion from nephrology.  MELD calculated 18.    10/06/2023 right upper quadrant ultrasound with coarsened nodular hepatic echotexture suggestive of cirrhotic changes, mild intrahepatic biliary dilation.  Absent gallbladder.  They recommended MRCP given dilated CBD.    10/13/2023 MRCP was limited due to breath artifact.  CBD unchanged with a dilation 1.5 cm in caliber.  Cirrhosis.  Sequela of portal hypertension including mild splenomegaly, large splenic varices left upper quadrant and trace.  Hepatic ascites.    01/12/24 CMP with a creatinine of 1.32, at patient's baseline, albumin 3.2, alk phos 146, total bili 2.9, AST 46.    Today, patient presents to clinic accompanied by her daughter.  She is doing well other than some excessive swelling in her legs.  Her diuretics are managed by her kidney doctor and she has not seen them recently.  Currently on Spironolactone  25 mg daily.  Also on Xifaxan  550 twice daily and Lactulose  15 mL 3 times daily.  In general feels fairly well other than being a little sad that she cannot get around like she used to and the swelling.  No abdominal ascites.    Denies fever, chills or weight loss.  Past Medical History:  Diagnosis Date   Allergy  seasonal   Anxiety    Arthritis    hands, back    B12 deficiency 03/31/2021   Cataract    surgery to remove   Chronic back pain    lower back    Cirrhosis of liver without ascites (HCC)    recent dx 07/11/18   Closed fracture of right tibial plateau 01/31/2018   Complex tear of medial meniscus of right knee 03/27/2018   COPD (chronic obstructive pulmonary disease) (HCC)    no inhaler   Diabetes mellitus without complication (HCC)    DJD (degenerative joint  disease)    Fibromyalgia    Genetic testing 03/08/2017   Stacey Summers underwent genetic counseling and testing for hereditary cancer syndromes on 02/21/2017. Her results were negative for mutations in all 83 genes analyzed by [facility]'s 83-gene Multi-Cancers Panel. 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   History of abnormal mammogram    History of arthroplasty of right knee 07/06/2018   Hyperlipidemia    Hypertension    Irregular heart beat    Neuromuscular disorder (HCC)    fibromyalgia   Sleep apnea    cpap some   Stress fracture of right tibia 03/27/2018   SVD (spontaneous vaginal delivery)    x 2   Tubular adenoma of colon 05/09/12   innumerable number of flat polyps    Past Surgical History:  Procedure Laterality Date   CARPAL TUNNEL RELEASE Left 10/20/14   [facility]   CATARACT EXTRACTION, BILATERAL     CESAREAN SECTION  1977   CHOLECYSTECTOMY  1990   COLONOSCOPY     IR RADIOLOGIST EVAL & MGMT  07/16/2019   KNEE ARTHROSCOPY WITH SUBCHONDROPLASTY Right 03/27/2018   Procedure: Right knee arthroscopic partial medial meniscus repair versus menisectomy with medial tibia subchondroplasty;  Surgeon: Sharl Selinda Dover, MD;  Location: [facility] OR;  Service: Orthopedics;  Laterality: Right;  120 mins   LARYNX SURGERY     POLYP REMOVAL   MULTIPLE TOOTH EXTRACTIONS     POLYPECTOMY     vocal cord   TONSILLECTOMY  1965   TUBAL LIGATION     TUMOR REMOVAL     benign fatty tumors on abdomen   UPPER GASTROINTESTINAL ENDOSCOPY      Current Outpatient Medications  Medication Sig Dispense Refill   albuterol  (VENTOLIN  HFA) 108 (90 Base) MCG/ACT inhaler Inhale 2 puffs into the lungs every 6 (six) hours as needed for wheezing or shortness of breath. 18 g 1    ALPRAZolam  (XANAX ) 0.25 MG tablet Take 1/2-1 tablet once daily as needed for severe anxiety/ panic attack. May cause hallucinations/ falls 30 tablet 0   ascorbic acid (VITAMIN C) 500 MG tablet Take 500 mg by mouth daily.     Biotin 5000 MCG SUBL Place under the tongue.     Blood Glucose Monitoring Suppl (ONETOUCH VERIO FLEX SYSTEM) w/Device KIT Check Bs up to 4 times daily Dx E11.9, 1 kit 0   Cholecalciferol (VITAMIN D3) 5000 units CAPS Take 5,000 Units by mouth daily.     cyclobenzaprine  (FLEXERIL ) 5 MG tablet TAKE 1 TABLET BY MOUTH THREE TIMES DAILY AS NEEDED FOR MUSCLE SPASM . APPOINTMENT REQUIRED FOR FUTURE REFILLS 30 tablet 0   diltiazem  (CARDIZEM  CD) 120 MG 24 hr capsule  Take 1 capsule (120 mg total) by mouth every Monday, Wednesday, and Friday. 45 capsule 2   fluticasone  (FLONASE ) 50 MCG/ACT nasal spray Place 2 sprays into both nostrils daily. 16 g 6   furosemide  (LASIX ) 40 MG tablet Take 1 tablet (40 mg total) by mouth 2 (two) times daily. 270 tablet 2   glucose blood (ONETOUCH VERIO) test strip Check Bs up to 4 times daily Dx E11.9, 400 each 3   hydrocortisone  cream 1 % Apply 1 Application topically 2 (two) times daily. Apply a sparingly (thin film) to facial rash twice daily for up to a week 30 g 0   lactulose  (CHRONULAC ) 10 GM/15ML solution Take 15 mLs (10 g total) by mouth 3 (three) times daily. TAKE 15 ML BY MOUTH  THREE TIMES DAILY AS NEEDED 1350 mL 3   levothyroxine  (SYNTHROID ) 25 MCG tablet Take 1 tablet (25 mcg total) by mouth daily. 90 tablet 3   lidocaine  (LIDODERM ) 5 % Apply 1 patch to area of pain daily as needed. Remove & Discard patch within 12 hours or as directed by MD 90 patch 3   meclizine  (ANTIVERT ) 25 MG tablet TAKE 1 TABLET BY MOUTH THREE TIMES DAILY AS NEEDED FOR DIZZINESS 90 tablet 1   Misc Natural Products (BEET ROOT PO) Take 1 Capful by mouth daily.     ondansetron  (ZOFRAN -ODT) 4 MG disintegrating tablet DISSOLVE 1 TABLET IN MOUTH EVERY 6 HOURS AS NEEDED FOR  NAUSEA AND VOMITING 20 tablet 0   ONETOUCH DELICA LANCETS 33G MISC USE ONE LANCET TO CHECK GLUCOSE ONCE DAILY AND AS NEEDED 100 each 0   spironolactone  (ALDACTONE ) 25 MG tablet Take 1 tablet by mouth once daily 90 tablet 3   spironolactone  (ALDACTONE ) 50 MG tablet Take 0.5 tablets (25 mg total) by mouth daily. 45 tablet 1   traMADol  (ULTRAM ) 50 MG tablet Take 1 tablet (50 mg total) by mouth every 12 (twelve) hours as needed. 10 tablet 0   XIFAXAN  550 MG TABS tablet Take 1 tablet (550 mg total) by mouth 2 (two) times daily. 180 tablet 2   No current facility-administered medications for this visit.    Allergies as of 02/06/2024 - Review Complete 02/06/2024  Allergen Reaction Noted   Latex Other (See Comments) 03/27/2018    Family History  Problem Relation Age of Onset   Heart failure Mother        d.37   Heart disease Mother    Prostate cancer Father        d.89 diagnosed in late-60s   Colon cancer Father        diagnosed at 70   Leukemia Maternal Aunt    Heart failure Maternal Uncle        d.50   Stomach cancer Paternal Aunt        questionable   Colon cancer Paternal Uncle        in his 85s   Heart failure Maternal Grandmother        d.45   Kidney cancer Cousin        d.78s maternal first-cousin   Colon polyps Brother        polyposis. Underwent partial colectomy at age 76.   Spina bifida Brother        died at birth   Colon polyps Son 64   Heart attack Son 69   Other Other 16       multiple lipomas   Esophageal cancer Neg Hx    Rectal cancer Neg  Hx    Breast cancer Neg Hx     Social History   Socioeconomic History   Marital status: Married    Spouse name: Lamar goes by [employer]   Number of children: 3   Years of education: 11   Highest education level: GED or equivalent  Occupational History   Occupation: retired  Tobacco Use   Smoking status: Former    Current packs/day: 0.00    Average packs/day: 1 pack/day for 3.0 years (3.0 ttl pk-yrs)    Types:  Cigarettes    Start date: 08/15/1966    Quit date: 08/15/1969    Years since quitting: 54.5   Smokeless tobacco: Never  Vaping Use   Vaping status: Never Used  Substance and Sexual Activity   Alcohol use: No    Alcohol/week: 0.0 standard drinks of alcohol   Drug use: No   Sexual activity: Not Currently  Other Topics Concern   Not on file  Social History Narrative   Daughter lives with them and helps a lot   Social Drivers of Health   Financial Resource Strain: Low Risk  (12/16/2022)   Overall Financial Resource Strain (CARDIA)    Difficulty of Paying Living Expenses: Not hard at all  Food Insecurity: No Food Insecurity (07/04/2023)   Hunger Vital Sign    Worried About Running Out of Food in the Last Year: Never true    Ran Out of Food in the Last Year: Never true  Transportation Needs: No Transportation Needs (07/04/2023)   PRAPARE - Administrator, Civil Service (Medical): No    Lack of Transportation (Non-Medical): No  Physical Activity: Inactive (12/16/2022)   Exercise Vital Sign    Days of Exercise per Week: 0 days    Minutes of Exercise per Session: 0 min  Stress: No Stress Concern Present (12/16/2022)   [employer] of Occupational Health - Occupational Stress Questionnaire    Feeling of Stress : Not at all  Social Connections: Socially Integrated (12/16/2022)   Social Connection and Isolation Panel    Frequency of Communication with Friends and Family: More than three times a week    Frequency of Social Gatherings with Friends and Family: More than three times a week    Attends Religious Services: More than 4 times per year    Active Member of [employer] or Organizations: Yes    Attends Engineer, structural: More than 4 times per year    Marital Status: Married  Catering manager Violence: Not At Risk (07/04/2023)   Humiliation, Afraid, Rape, and Kick questionnaire    Fear of Current or Ex-Partner: No    Emotionally Abused: No    Physically Abused: No     Sexually Abused: No    Review of Systems:    Constitutional: No weight loss, fever or chills Cardiovascular: No chest pain Respiratory: No SOB  Gastrointestinal: See HPI and otherwise negative   Physical Exam:  Vital signs: BP 136/78 (BP Location: Right Wrist, Patient Position: Sitting, Cuff Size: Normal)   Pulse (!) 54   Ht 5' 1 (1.549 m)   Wt 243 lb 6 oz (110.4 kg)   BMI 45.99 kg/m    Constitutional:   Pleasant overweight Caucasian female appears to be in NAD, Well developed, Well nourished, alert and cooperative Respiratory: Respirations even and unlabored. Lungs clear to auscultation bilaterally.   No wheezes, crackles, or rhonchi.  Cardiovascular: Normal S1, S2. No MRG. Regular rate and rhythm. + 1+ pitting edema to  the level of the knee Gastrointestinal:  Soft, nondistended, nontender. No rebound or guarding. Normal bowel sounds. No appreciable masses or hepatomegaly. Rectal:  Not performed.  Msk:  Symmetrical without gross deformities. Without edema, no deformity or joint abnormality. +ambulates with a cane Psychiatric: Oriented to person, place and time. Demonstrates good judgement and reason without abnormal affect or behaviors.  RELEVANT LABS AND IMAGING: CBC    Component Value Date/Time   WBC 7.9 11/22/2023 1102   WBC 6.1 09/27/2023 1447   RBC 3.98 11/22/2023 1102   RBC 3.72 (L) 09/27/2023 1447   HGB 13.7 11/22/2023 1102   HCT 38.9 11/22/2023 1102   PLT 101 (L) 11/22/2023 1102   MCV 98 (H) 11/22/2023 1102   MCH 34.4 (H) 11/22/2023 1102   MCH 33.4 08/22/2023 1442   MCHC 35.2 11/22/2023 1102   MCHC 34.1 09/27/2023 1447   RDW 12.2 11/22/2023 1102   LYMPHSABS 2.3 09/27/2023 1447   LYMPHSABS 0.7 09/04/2023 1426   MONOABS 0.7 09/27/2023 1447   EOSABS 0.1 09/27/2023 1447   EOSABS 0.0 09/04/2023 1426   BASOSABS 0.0 09/27/2023 1447   BASOSABS 0.0 09/04/2023 1426    CMP     Component Value Date/Time   NA 140 01/12/2024 0947   K 3.7 01/12/2024 0947   CL 99  01/12/2024 0947   CO2 27 01/12/2024 0947   GLUCOSE 191 (H) 01/12/2024 0947   GLUCOSE 127 (H) 09/27/2023 1447   BUN 25 01/12/2024 0947   CREATININE 1.32 (H) 01/12/2024 0947   CREATININE 0.67 12/20/2012 1044   CALCIUM  9.0 01/12/2024 0947   PROT 5.5 (L) 01/12/2024 0947   ALBUMIN 3.2 (L) 01/12/2024 0947   AST 46 (H) 01/12/2024 0947   ALT 24 01/12/2024 0947   ALKPHOS 146 (H) 01/12/2024 0947   BILITOT 2.9 (H) 01/12/2024 0947   GFRNONAA 59 (L) 08/22/2023 1442   GFRNONAA >89 12/20/2012 1044   GFRAA >60 10/04/2019 0820   GFRAA >89 12/20/2012 1044    Assessment: 1.  Decompensated Mastel cirrhosis: With esophageal varices (last EGD 2021) , history hepatic encephalopathy and ascites (diuretics managed by nephrology), does have slightly more fluid in her legs today 2.  Chronic fatigue: Secondary to comorbid conditions including cirrhosis  Plan: 1.  Due in 4 to 6 months for repeat HCC screening.  Will order a repeat right upper quadrant ultrasound prior to next visit with us . 2.  Will order repeat labs including CBC, CMP, PT/INR and AFP prior to her next visit with us  in 4 to 6 months 3.  For now continue Lactulose  3 times daily 4.  Continue Aldactone  under discretion from nephrology, recommend they call them to see if this can be titrated up given increasing peripheral edema 5.  Continue Xifaxan  550 twice daily 6.  Patient to follow in clinic in 4 to 6 months or sooner if necessary.  Delon Failing, PA-C [facility] 02/06/2024, 3:00 PM  Cc: Jolinda Norene HERO, DO

## 2024-02-06 NOTE — Progress Notes (Signed)
 Patient tolerated Vitamin B 12 injection with no complaints voiced.  Site clean and dry with no bruising or swelling noted.  No complaints of pain.  Discharged with vital signs stable and no signs or symptoms of distress noted.   ?

## 2024-02-06 NOTE — Patient Instructions (Signed)
 Follow up in 6 months or sooner if needed.  _______________________________________________________  If your blood pressure at your visit was 140/90 or greater, please contact your primary care physician to follow up on this.  _______________________________________________________  If you are age 76 or older, your body mass index should be between 23-30. Your Body mass index is 45.99 kg/m. If this is out of the aforementioned range listed, please consider follow up with your Primary Care Provider.  If you are age 23 or younger, your body mass index should be between 19-25. Your Body mass index is 45.99 kg/m. If this is out of the aformentioned range listed, please consider follow up with your Primary Care Provider.   ________________________________________________________  The [facility] providers would like to encourage you to use MYCHART to communicate with providers for non-urgent requests or questions.  Due to long hold times on the telephone, sending your provider a message by [facility] may be a faster and more efficient way to get a response.  Please allow 48 business hours for a response.  Please remember that this is for non-urgent requests.  _______________________________________________________ m

## 2024-02-06 NOTE — Patient Instructions (Signed)
 [facility] [place] - A DEPT OF [facility]  Discharge Instructions: Thank you for choosing [facility] to provide your oncology and hematology care.  If you have a lab appointment with the Cancer Center - please note that after April 8th, 2024, all labs will be drawn in the cancer center.  You do not have to check in or register with the main entrance as you have in the past but will complete your check-in in the cancer center.  Wear comfortable clothing and clothing appropriate for easy access to any Portacath or PICC line.   We strive to give you quality time with your provider. You may need to reschedule your appointment if you arrive late (15 or more minutes).  Arriving late affects you and other patients whose appointments are after yours.  Also, if you miss three or more appointments without notifying the office, you may be dismissed from the clinic at the provider's discretion.      For prescription refill requests, have your pharmacy contact our office and allow 72 hours for refills to be completed.    Today you received the following: Vitamin B12   Vitamin B12 Injection What is this medication? Vitamin B12 (VAHY tuh min B12) prevents and treats low vitamin B12 levels in your body. It is used in people who do not get enough vitamin B12 from their diet or when their digestive tract does not absorb enough. Vitamin B12 plays an important role in maintaining the health of your nervous system and red blood cells. This medicine may be used for other purposes; ask your health care provider or pharmacist if you have questions. COMMON BRAND NAME(S): B-12 Compliance Kit, B-12 Injection Kit, Cyomin, Dodex, LA-12, Nutri-Twelve, Physicians EZ Use B-12, Primabalt, Vitamin Deficiency Injectable System - B12 What should I tell my care team before I take this medication? They need to know if you have any of these conditions: Kidney disease Leber's  disease Megaloblastic anemia An unusual or allergic reaction to cyanocobalamin, cobalt, other medications, foods, dyes, or preservatives Pregnant or trying to get pregnant Breast-feeding How should I use this medication? This medication is injected into a muscle or deeply under the skin. It is usually given in a clinic or care team's office. However, your care team may teach you how to inject yourself. Follow all instructions. Talk to your care team about the use of this medication in children. Special care may be needed. Overdosage: If you think you have taken too much of this medicine contact a poison control center or emergency room at once. NOTE: This medicine is only for you. Do not share this medicine with others. What if I miss a dose? If you are given your dose at a clinic or care team's office, call to reschedule your appointment. If you give your own injections, and you miss a dose, take it as soon as you can. If it is almost time for your next dose, take only that dose. Do not take double or extra doses. What may interact with this medication? Alcohol Colchicine This list may not describe all possible interactions. Give your health care provider a list of all the medicines, herbs, non-prescription drugs, or dietary supplements you use. Also tell them if you smoke, drink alcohol, or use illegal drugs. Some items may interact with your medicine. What should I watch for while using this medication? Visit your care team regularly. You may need blood work done while you are  taking this medication. You may need to follow a special diet. Talk to your care team. Limit your alcohol intake and avoid smoking to get the best benefit. What side effects may I notice from receiving this medication? Side effects that you should report to your care team as soon as possible: Allergic reactions--skin rash, itching, hives, swelling of the face, lips, tongue, or throat Swelling of the ankles, hands, or  feet Trouble breathing Side effects that usually do not require medical attention (report to your care team if they continue or are bothersome): Diarrhea This list may not describe all possible side effects. Call your doctor for medical advice about side effects. You may report side effects to FDA at 1-800-FDA-1088. Where should I keep my medication? Keep out of the reach of children. Store at room temperature between 15 and 30 degrees C (59 and 85 degrees F). Protect from light. Throw away any unused medication after the expiration date. NOTE: This sheet is a summary. It may not cover all possible information. If you have questions about this medicine, talk to your doctor, pharmacist, or health care provider.  2024 Elsevier/Gold Standard (2021-04-13 00:00:00)    To help prevent nausea and vomiting after your treatment, we encourage you to take your nausea medication as directed.  BELOW ARE SYMPTOMS THAT SHOULD BE REPORTED IMMEDIATELY: *FEVER GREATER THAN 100.4 F (38 C) OR HIGHER *CHILLS OR SWEATING *NAUSEA AND VOMITING THAT IS NOT CONTROLLED WITH YOUR NAUSEA MEDICATION *UNUSUAL SHORTNESS OF BREATH *UNUSUAL BRUISING OR BLEEDING *URINARY PROBLEMS (pain or burning when urinating, or frequent urination) *BOWEL PROBLEMS (unusual diarrhea, constipation, pain near the anus) TENDERNESS IN MOUTH AND THROAT WITH OR WITHOUT PRESENCE OF ULCERS (sore throat, sores in mouth, or a toothache) UNUSUAL RASH, SWELLING OR PAIN  UNUSUAL VAGINAL DISCHARGE OR ITCHING   Items with * indicate a potential emergency and should be followed up as soon as possible or go to the Emergency Department if any problems should occur.  Please show the CHEMOTHERAPY ALERT CARD or IMMUNOTHERAPY ALERT CARD at check-in to the Emergency Department and triage nurse.  Should you have questions after your visit or need to cancel or reschedule your appointment, please contact [facility] [place] - A DEPT OF [facility] [phone]  and follow the prompts.  Office hours are 8:00 a.m. to 4:30 p.m. Monday - Friday. Please note that voicemails left after 4:00 p.m. may not be returned until the following business day.  We are closed weekends and major holidays. You have access to a nurse at all times for urgent questions. Please call the main number to the clinic [phone] and follow the prompts.  For any non-urgent questions, you may also contact your provider using MyChart. We now offer e-Visits for anyone 58 and older to request care online for non-urgent symptoms. For details visit [email or web address].   Also download the MyChart app! Go to the app store, search "MyChart", open the app, select [place], and log in with your MyChart username and password.

## 2024-02-07 ENCOUNTER — Other Ambulatory Visit: Payer: Self-pay | Admitting: Gastroenterology

## 2024-02-12 ENCOUNTER — Other Ambulatory Visit: Payer: Self-pay | Admitting: Family Medicine

## 2024-02-12 DIAGNOSIS — M546 Pain in thoracic spine: Secondary | ICD-10-CM

## 2024-02-21 ENCOUNTER — Ambulatory Visit: Admitting: Family Medicine

## 2024-02-21 ENCOUNTER — Encounter: Payer: Self-pay | Admitting: Family Medicine

## 2024-02-21 ENCOUNTER — Other Ambulatory Visit: Payer: Self-pay

## 2024-02-21 ENCOUNTER — Other Ambulatory Visit

## 2024-02-21 ENCOUNTER — Telehealth (INDEPENDENT_AMBULATORY_CARE_PROVIDER_SITE_OTHER): Admitting: Family Medicine

## 2024-02-21 DIAGNOSIS — R0981 Nasal congestion: Secondary | ICD-10-CM

## 2024-02-21 DIAGNOSIS — M545 Low back pain, unspecified: Secondary | ICD-10-CM

## 2024-02-21 DIAGNOSIS — M546 Pain in thoracic spine: Secondary | ICD-10-CM | POA: Diagnosis not present

## 2024-02-21 DIAGNOSIS — G2581 Restless legs syndrome: Secondary | ICD-10-CM

## 2024-02-21 DIAGNOSIS — K7581 Nonalcoholic steatohepatitis (NASH): Secondary | ICD-10-CM

## 2024-02-21 DIAGNOSIS — K7460 Unspecified cirrhosis of liver: Secondary | ICD-10-CM | POA: Diagnosis not present

## 2024-02-21 DIAGNOSIS — R42 Dizziness and giddiness: Secondary | ICD-10-CM

## 2024-02-21 DIAGNOSIS — Z79899 Other long term (current) drug therapy: Secondary | ICD-10-CM

## 2024-02-21 MED ORDER — TRAMADOL HCL 50 MG PO TABS: 50.0000 mg | ORAL_TABLET | Freq: Every day | ORAL | 5 refills | Status: AC | PRN

## 2024-02-21 MED ORDER — FLUTICASONE PROPIONATE 50 MCG/ACT NA SUSP: 2.0000 | Freq: Every day | NASAL | 12 refills | Status: AC

## 2024-02-21 MED ORDER — MECLIZINE HCL 25 MG PO TABS: 25.0000 mg | ORAL_TABLET | Freq: Every day | ORAL | 0 refills | Status: AC | PRN

## 2024-02-21 NOTE — Progress Notes (Signed)
 MyChart Video visit  Subjective: CC: Follow-up chronic pain PCP: Jolinda Norene HERO, DO YEP:Stacey Summers is a 78 y.o. female. Patient provides verbal consent for consult held via video.  Due to COVID-19 pandemic this visit was conducted virtually. This visit type was conducted due to national recommendations for restrictions regarding the COVID-19 Pandemic (e.g. social distancing, sheltering in place) in an effort to limit this patient's exposure and mitigate transmission in our community. All issues noted in this document were discussed and addressed.  A physical exam was not performed with this format.   Location of patient: Home Location of provider: [facility] Others present for call: Stacey Summers, her daughter  1.  Back pain This is a chronic issue for her.  She has used tramadol  in the past with last refill back in May for #10 for back pain.  This has been somewhat helpful but of course not totally resolved issues.  She has an upcoming event and she worries about having exacerbation of her back pain would like to get renewal on her tramadol .  She also notes poor sleep secondary to restless leg  2.  Dizziness Reports that this is not very frequent but she needs refills on meclizine , which was prescribed back in 2023.  Meclizine  does seem to help  3.  Allergic rhinitis Needs refills on Flonase .   ROS: Per HPI  Allergies  Allergen Reactions   Latex Other (See Comments)    my hands feels numb   Past Medical History:  Diagnosis Date   Allergy    seasonal   Anxiety    Arthritis    hands, back    B12 deficiency 03/31/2021   Cataract    surgery to remove   Chronic back pain    lower back    Cirrhosis of liver without ascites (HCC)    recent dx 07/11/18   Closed fracture of right tibial plateau 01/31/2018   Complex tear of medial meniscus of right knee 03/27/2018   COPD (chronic obstructive pulmonary disease) (HCC)    no inhaler   Diabetes mellitus without complication (HCC)     DJD (degenerative joint disease)    Fibromyalgia    Genetic testing 03/08/2017   Ms. Moten underwent genetic counseling and testing for hereditary cancer syndromes on 02/21/2017. Her results were negative for mutations in all 83 genes analyzed by [facility]'s 83-gene Multi-Cancers Panel. 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   History of abnormal mammogram    History of arthroplasty of right knee 07/06/2018   Hyperlipidemia    Hypertension    Irregular heart beat    Neuromuscular disorder (HCC)    fibromyalgia   Sleep apnea    cpap some   Stress fracture of right tibia 03/27/2018   SVD (spontaneous vaginal delivery)    x 2   Tubular adenoma of colon 05/09/12   innumerable number of flat polyps    Current Outpatient Medications:    albuterol  (VENTOLIN  HFA) 108 (90 Base) MCG/ACT inhaler, Inhale 2 puffs into the lungs every 6 (six) hours as needed for wheezing or shortness of breath., Disp: 18 g, Rfl: 1   ALPRAZolam  (XANAX ) 0.25 MG tablet, Take 1/2-1 tablet once daily as needed for severe anxiety/ panic attack. May cause hallucinations/ falls, Disp:  30 tablet, Rfl: 0   ascorbic acid (VITAMIN C) 500 MG tablet, Take 500 mg by mouth daily., Disp: , Rfl:    Biotin 5000 MCG SUBL, Place under the tongue., Disp: , Rfl:    Blood Glucose Monitoring Suppl (ONETOUCH VERIO FLEX SYSTEM) w/Device KIT, Check Bs up to 4 times daily Dx E11.9,, Disp: 1 kit, Rfl: 0   Cholecalciferol (VITAMIN D3) 5000 units CAPS, Take 5,000 Units by mouth daily., Disp: , Rfl:    cyclobenzaprine  (FLEXERIL ) 5 MG tablet, TAKE 1 TABLET BY MOUTH THREE TIMES DAILY AS NEEDED FOR MUSCLE SPASM . APPOINTMENT REQUIRED FOR FUTURE REFILLS, Disp: 30 tablet, Rfl: 0   diltiazem  (CARDIZEM  CD) 120 MG 24  hr capsule, Take 1 capsule (120 mg total) by mouth every Monday, Wednesday, and Friday., Disp: 45 capsule, Rfl: 2   fluticasone  (FLONASE ) 50 MCG/ACT nasal spray, Place 2 sprays into both nostrils daily., Disp: 16 g, Rfl: 6   furosemide  (LASIX ) 40 MG tablet, Take 1 tablet (40 mg total) by mouth 2 (two) times daily., Disp: 270 tablet, Rfl: 2   glucose blood (ONETOUCH VERIO) test strip, Check Bs up to 4 times daily Dx E11.9,, Disp: 400 each, Rfl: 3   hydrocortisone  cream 1 %, Apply 1 Application topically 2 (two) times daily. Apply a sparingly (thin film) to facial rash twice daily for up to a week, Disp: 30 g, Rfl: 0   lactulose  (CHRONULAC ) 10 GM/15ML solution, Take 15 mLs (10 g total) by mouth 3 (three) times daily. TAKE 15 ML BY MOUTH  THREE TIMES DAILY AS NEEDED, Disp: 1350 mL, Rfl: 3   levothyroxine  (SYNTHROID ) 25 MCG tablet, Take 1 tablet (25 mcg total) by mouth daily., Disp: 90 tablet, Rfl: 3   lidocaine  (LIDODERM ) 5 %, Apply 1 patch to area of pain daily as needed. Remove & Discard patch within 12 hours or as directed by MD, Disp: 90 patch, Rfl: 3   meclizine  (ANTIVERT ) 25 MG tablet, TAKE 1 TABLET BY MOUTH THREE TIMES DAILY AS NEEDED FOR DIZZINESS, Disp: 90 tablet, Rfl: 1   Misc Natural Products (BEET ROOT PO), Take 1 Capful by mouth daily., Disp: , Rfl:    ondansetron  (ZOFRAN -ODT) 4 MG disintegrating tablet, DISSOLVE 1 TABLET IN MOUTH EVERY 6 HOURS AS NEEDED FOR NAUSEA AND VOMITING, Disp: 20 tablet, Rfl: 0   ONETOUCH DELICA LANCETS 33G MISC, USE ONE LANCET TO CHECK GLUCOSE ONCE DAILY AND AS NEEDED, Disp: 100 each, Rfl: 0   spironolactone  (ALDACTONE ) 25 MG tablet, Take 1 tablet by mouth once daily, Disp: 90 tablet, Rfl: 3   spironolactone  (ALDACTONE ) 50 MG tablet, Take 0.5 tablets (25 mg total) by mouth daily., Disp: 45 tablet, Rfl: 1   traMADol  (ULTRAM ) 50 MG tablet, Take 1 tablet (50 mg total) by mouth every 12 (twelve) hours as needed., Disp: 10 tablet, Rfl: 0   XIFAXAN  550 MG TABS tablet,  TAKE ONE TABLET BY MOUTH TWICE A DAY, Disp: 180 tablet, Rfl: 2  Gen: Nontoxic-appearing elderly female HEENT: No gross scleral injection no gross rhinorrhea Psych: Mood stable, speech normal. Neuro: Hard of hearing but otherwise no focal neurologic deficits identified  Assessment/ Plan: 78 y.o. female   Thoracolumbar back pain - Plan: traMADol  (ULTRAM ) 50 MG tablet  Liver cirrhosis secondary to NASH (HCC)  Complaint of nasal congestion - Plan: fluticasone  (FLONASE ) 50 MCG/ACT nasal spray  Vertigo - Plan: meclizine  (ANTIVERT ) 25 MG tablet  Restless leg  Tramadol  renewed.  She is up-to-date on UDS and CSC.  The national narcotic database reviewed with no red flags.  Caution sedation with use of tramadol .  We discussed that this may aid some of the restless leg she has been experiencing.  Need to limit liver metabolized medications if able  Flonase  and Antivert  renewed for as needed use  Start time: 1:06pm End time: 1:14pm  Total time spent on patient care (including video visit/ documentation): 10 minutes  Shakeya Kerkman CHRISTELLA Fielding, DO [facility] [phone]

## 2024-02-22 ENCOUNTER — Ambulatory Visit

## 2024-02-22 NOTE — Progress Notes (Signed)
 Unable to do diabetic eye exam due to eyes not dilating

## 2024-02-24 LAB — TOXASSURE SELECT 13 (MW), URINE

## 2024-02-26 ENCOUNTER — Ambulatory Visit: Payer: Self-pay | Admitting: Family Medicine

## 2024-03-01 DIAGNOSIS — H40033 Anatomical narrow angle, bilateral: Secondary | ICD-10-CM | POA: Diagnosis not present

## 2024-03-01 DIAGNOSIS — E119 Type 2 diabetes mellitus without complications: Secondary | ICD-10-CM | POA: Diagnosis not present

## 2024-03-06 ENCOUNTER — Encounter

## 2024-03-06 DIAGNOSIS — H903 Sensorineural hearing loss, bilateral: Secondary | ICD-10-CM | POA: Diagnosis not present

## 2024-03-12 ENCOUNTER — Inpatient Hospital Stay: Payer: PPO | Attending: Hematology

## 2024-03-12 DIAGNOSIS — D696 Thrombocytopenia, unspecified: Secondary | ICD-10-CM | POA: Insufficient documentation

## 2024-03-12 DIAGNOSIS — E611 Iron deficiency: Secondary | ICD-10-CM | POA: Insufficient documentation

## 2024-03-12 DIAGNOSIS — E538 Deficiency of other specified B group vitamins: Secondary | ICD-10-CM | POA: Insufficient documentation

## 2024-03-12 LAB — CBC WITH DIFFERENTIAL/PLATELET
Abs Immature Granulocytes: 0.01 K/uL (ref 0.00–0.07)
Basophils Absolute: 0.1 K/uL (ref 0.0–0.1)
Basophils Relative: 1 %
Eosinophils Absolute: 0.2 K/uL (ref 0.0–0.5)
Eosinophils Relative: 4 %
HCT: 39.9 % (ref 36.0–46.0)
Hemoglobin: 13.1 g/dL (ref 12.0–15.0)
Immature Granulocytes: 0 %
Lymphocytes Relative: 28 %
Lymphs Abs: 1.4 K/uL (ref 0.7–4.0)
MCH: 34.2 pg — ABNORMAL HIGH (ref 26.0–34.0)
MCHC: 32.8 g/dL (ref 30.0–36.0)
MCV: 104.2 fL — ABNORMAL HIGH (ref 80.0–100.0)
Monocytes Absolute: 0.5 K/uL (ref 0.1–1.0)
Monocytes Relative: 9 %
Neutro Abs: 3 K/uL (ref 1.7–7.7)
Neutrophils Relative %: 58 %
Platelets: 106 K/uL — ABNORMAL LOW (ref 150–400)
RBC: 3.83 MIL/uL — ABNORMAL LOW (ref 3.87–5.11)
RDW: 13.6 % (ref 11.5–15.5)
WBC: 5.2 K/uL (ref 4.0–10.5)
nRBC: 0 % (ref 0.0–0.2)

## 2024-03-12 LAB — COMPREHENSIVE METABOLIC PANEL WITH GFR
ALT: 25 U/L (ref 0–44)
AST: 43 U/L — ABNORMAL HIGH (ref 15–41)
Albumin: 2.7 g/dL — ABNORMAL LOW (ref 3.5–5.0)
Alkaline Phosphatase: 92 U/L (ref 38–126)
Anion gap: 10 (ref 5–15)
BUN: 22 mg/dL (ref 8–23)
CO2: 29 mmol/L (ref 22–32)
Calcium: 8.9 mg/dL (ref 8.9–10.3)
Chloride: 98 mmol/L (ref 98–111)
Creatinine, Ser: 1.3 mg/dL — ABNORMAL HIGH (ref 0.44–1.00)
GFR, Estimated: 43 mL/min — ABNORMAL LOW (ref >60)
Glucose, Bld: 181 mg/dL — ABNORMAL HIGH (ref 70–99)
Potassium: 4.6 mmol/L (ref 3.5–5.1)
Sodium: 137 mmol/L (ref 135–145)
Total Bilirubin: 5.2 mg/dL — ABNORMAL HIGH (ref 0.0–1.2)
Total Protein: 5.6 g/dL — ABNORMAL LOW (ref 6.5–8.1)

## 2024-03-12 LAB — VITAMIN B12: Vitamin B-12: 1237 pg/mL — ABNORMAL HIGH (ref 180–914)

## 2024-03-12 LAB — IRON AND TIBC
Iron: 140 ug/dL (ref 28–170)
Saturation Ratios: 59 % — ABNORMAL HIGH (ref 10.4–31.8)
TIBC: 238 ug/dL — ABNORMAL LOW (ref 250–450)
UIBC: 98 ug/dL

## 2024-03-12 LAB — FERRITIN: Ferritin: 122 ng/mL (ref 11–307)

## 2024-03-14 LAB — METHYLMALONIC ACID, SERUM: Methylmalonic Acid, Quantitative: 302 nmol/L (ref 0–378)

## 2024-03-18 NOTE — Progress Notes (Unsigned)
 [facility] [address], [place] [address]   CLINIC:  Medical Oncology/Hematology  PCP:  Jolinda Norene HERO, DO [address] [place] [address] [phone]   REASON FOR VISIT:  Follow-up for thrombocytopenia (in the setting of liver cirrhosis) + B12 deficiency + iron deficiency without anemia   PRIOR THERAPY: None   CURRENT THERAPY: Vitamin B12 injections every 6 weeks + oral iron supplement  INTERVAL HISTORY:   Stacey Summers 77 y.o. female returns for routine follow-up of her thrombocytopenia, B12 deficiency, iron deficiency, and lung nodules.  She was last evaluated via telemedicine visit by Pleasant Barefoot PA-C on 08/29/2023.   At today's visit, she reports feeling fair.   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   She denies noticing any bleeding events such as hematemesis, hematochezia, epistaxis, melena, or hematuria.  She does have some mild easy bruising on her extremities, but denies any petechial rash. No B symptoms such as fever, chills, night sweats, unintentional weight loss.    She continues to have chronic fatigue related to her multiple medical comorbidities. She reports compliance with vitamin B12 injections every 6 weeks  She has little to no energy and 50% appetite. She endorses that she is maintaining a stable weight.  ASSESSMENT & PLAN:  1.  Thrombocytopenia: - Platelet count is ranging anywhere between 70-140. - Most recent platelets (02/21/2024) are 106, at baseline. - CT of the AP on 06/27/2018 showed spleen is mildly enlarged.  She has cirrhosis of the liver. - MRI abdomen (06/29/2022): Spleen normal in size without significant abnormality - EGD on 08/20/2019 showed grade 1 varices, but patient denies any melena or bright red blood per rectum - No signs or symptoms of bleeding at this time.    Admits to easy bruising but denies petechial rash. - Differential diagnosis favors thrombocytopenia secondary to splenomegaly and cirrhosis. - PLAN: No treatment of thrombocytopenia needed at this time.  RTC for labs and follow-up visit in 1 year - If any significant changes from baseline, would consider bone marrow biopsy to rule out co-existent marrow disorder    2.  Iron deficiency (without anemia), RESOLVED - Iron panel (07/28/2021): Ferritin 35, iron saturation 44% - Patient denies any signs of GI blood loss - Most recent EGD (08/20/2019) showed small esophageal varices and mild portal hypertensive gastropathy - She took iron tablets since December 2022, stopped in February 2024 due to elevated iron levels. - Most recent labs (03/12/2024): Hgb 13.1/MCV 104.2, ferritin 122, iron saturation 59% - PLAN: No indication to restart iron pill at this time.  We will recheck iron panel at follow-up visit in 1 year  3.  B12 deficiency - B12 deficiency evidenced by labs (03/26/2021) showing elevated methylmalonic acid 629, although B12 was normal at 336 - Currently receiving vitamin B12 injections every 6 weeks  - Most recent labs (03/12/2024): Vitamin B12 1237, MMA normal  - PLAN: We will DECREASE vitamin B12 injections to be given every 8 weeks   4.  Lung nodules: - She is a former smoker and has not smoked cigarettes since 1971. - CT of the chest from 10/04/2019 which showed resolution of recent COVID-19 pneumonia.  Bilateral pulmonary nodules, less than 4 mm in size have been stable compared to previous CT chest 06/28/2019 -Repeat CT chest (07/28/2021): Bilateral small subpleural nodules are stable and benign, largest nodule in the peripheral  left lower lobe measures 3.5 mm, unchanged. - PLAN: No further follow-up recommended per radiology report  PLAN SUMMARY: >> B12 injections every 8 weeks (instead of every 6 weeks) >> Labs in 1 year = CBC/D, CMP, B12, MMA, ferritin, iron/TIBC >> RTC in 1 year  (1 week after labs) for OFFICE visit     REVIEW OF SYSTEMS:   Review of Systems  Constitutional:  Positive for fatigue. Negative for appetite change, chills, diaphoresis, fever and unexpected weight change.  HENT:   Positive for trouble swallowing (sometimes). Negative for lump/mass and nosebleeds.   Eyes:  Negative for eye problems.  Respiratory:  Positive for shortness of breath (with exertion). Negative for cough and hemoptysis.   Cardiovascular:  Positive for leg swelling. Negative for chest pain and palpitations.  Gastrointestinal:  Negative for abdominal pain, blood in stool, constipation, diarrhea, nausea and vomiting.  Genitourinary:  Negative for hematuria.   Skin: Negative.   Neurological:  Positive for dizziness. Negative for headaches and light-headedness.  Hematological:  Does not bruise/bleed easily.     PHYSICAL EXAM:  ECOG PERFORMANCE STATUS: 3 - Symptomatic, >50% confined to bed  Vitals:   03/19/24 1338 03/19/24 1343  BP: (!) 144/61 (!) 157/58  Pulse: (!) 50   Resp: 16   Temp: 97.8 F (36.6 C)   SpO2: 100%    Physical Exam Constitutional:      Appearance: Normal appearance. She is morbidly obese.  Cardiovascular:     Rate and Rhythm: Bradycardia present.     Heart sounds: Normal heart sounds.  Pulmonary:     Breath sounds: Normal breath sounds.  Musculoskeletal:     Right lower leg: Edema present.     Left lower leg: Edema present.  Neurological:     General: No focal deficit present.     Mental Status: Mental status is at baseline.  Psychiatric:        Behavior: Behavior normal. Behavior is cooperative.     PAST MEDICAL/SURGICAL HISTORY:  Past Medical History:  Diagnosis Date   Allergy    seasonal   Anxiety    Arthritis    hands, back    B12 deficiency 03/31/2021   Cataract    surgery to remove   Chronic back pain    lower back    Cirrhosis of liver without ascites (HCC)    recent dx 07/11/18   Closed fracture of right tibial plateau  01/31/2018   Complex tear of medial meniscus of right knee 03/27/2018   COPD (chronic obstructive pulmonary disease) (HCC)    no inhaler   Diabetes mellitus without complication (HCC)    DJD (degenerative joint disease)    Fibromyalgia    Genetic testing 03/08/2017   Ms. Wolgamott underwent genetic counseling and testing for hereditary cancer syndromes on 02/21/2017. Her results were negative for mutations in all 83 genes analyzed by [facility]'s 83-gene Multi-Cancers Panel. 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   History of abnormal mammogram    History of arthroplasty of right knee 07/06/2018   Hyperlipidemia    Hypertension    Irregular heart beat    Neuromuscular disorder (HCC)    fibromyalgia   Sleep apnea    cpap some   Stress fracture of right tibia 03/27/2018  SVD (spontaneous vaginal delivery)    x 2   Tubular adenoma of colon 05/09/12   innumerable number of flat polyps   Past Surgical History:  Procedure Laterality Date   CARPAL TUNNEL RELEASE Left 10/20/14   [facility]   CATARACT EXTRACTION, BILATERAL     CESAREAN SECTION  1977   CHOLECYSTECTOMY  1990   COLONOSCOPY     IR RADIOLOGIST EVAL & MGMT  07/16/2019   KNEE ARTHROSCOPY WITH SUBCHONDROPLASTY Right 03/27/2018   Procedure: Right knee arthroscopic partial medial meniscus repair versus menisectomy with medial tibia subchondroplasty;  Surgeon: Sharl Selinda Dover, MD;  Location: [facility] OR;  Service: Orthopedics;  Laterality: Right;  120 mins   LARYNX SURGERY     POLYP REMOVAL   MULTIPLE TOOTH EXTRACTIONS     POLYPECTOMY     vocal cord   TONSILLECTOMY  1965   TUBAL LIGATION     TUMOR REMOVAL     benign fatty tumors on abdomen   UPPER GASTROINTESTINAL ENDOSCOPY      SOCIAL  HISTORY:  Social History   Socioeconomic History   Marital status: Married    Spouse name: Lamar goes by [employer]   Number of children: 3   Years of education: 11   Highest education level: GED or equivalent  Occupational History   Occupation: retired  Tobacco Use   Smoking status: Former    Current packs/day: 0.00    Average packs/day: 1 pack/day for 3.0 years (3.0 ttl pk-yrs)    Types: Cigarettes    Start date: 08/15/1966    Quit date: 08/15/1969    Years since quitting: 54.6   Smokeless tobacco: Never  Vaping Use   Vaping status: Never Used  Substance and Sexual Activity   Alcohol use: No    Alcohol/week: 0.0 standard drinks of alcohol   Drug use: No   Sexual activity: Not Currently  Other Topics Concern   Not on file  Social History Narrative   Daughter lives with them and helps a lot   Social Drivers of Health   Financial Resource Strain: Low Risk  (12/16/2022)   Overall Financial Resource Strain (CARDIA)    Difficulty of Paying Living Expenses: Not hard at all  Food Insecurity: No Food Insecurity (07/04/2023)   Hunger Vital Sign    Worried About Running Out of Food in the Last Year: Never true    Ran Out of Food in the Last Year: Never true  Transportation Needs: No Transportation Needs (07/04/2023)   PRAPARE - Administrator, Civil Service (Medical): No    Lack of Transportation (Non-Medical): No  Physical Activity: Inactive (12/16/2022)   Exercise Vital Sign    Days of Exercise per Week: 0 days    Minutes of Exercise per Session: 0 min  Stress: No Stress Concern Present (12/16/2022)   [employer] of Occupational Health - Occupational Stress Questionnaire    Feeling of Stress : Not at all  Social Connections: Socially Integrated (12/16/2022)   Social Connection and Isolation Panel    Frequency of Communication with Friends and Family: More than three times a week    Frequency of Social Gatherings with Friends and Family: More than three times a week     Attends Religious Services: More than 4 times per year    Active Member of [employer] or Organizations: Yes    Attends Engineer, structural: More than 4 times per year    Marital Status: Married  Catering manager Violence:  Not At Risk (07/04/2023)   Humiliation, Afraid, Rape, and Kick questionnaire    Fear of Current or Ex-Partner: No    Emotionally Abused: No    Physically Abused: No    Sexually Abused: No    FAMILY HISTORY:  Family History  Problem Relation Age of Onset   Heart failure Mother        d.37   Heart disease Mother    Prostate cancer Father        d.89 diagnosed in late-60s   Colon cancer Father        diagnosed at 25   Leukemia Maternal Aunt    Heart failure Maternal Uncle        d.50   Stomach cancer Paternal Aunt        questionable   Colon cancer Paternal Uncle        in his 59s   Heart failure Maternal Grandmother        d.45   Kidney cancer Cousin        d.78s maternal first-cousin   Colon polyps Brother        polyposis. Underwent partial colectomy at age 52.   Spina bifida Brother        died at birth   Colon polyps Son 71   Heart attack Son 69   Other Other 16       multiple lipomas   Esophageal cancer Neg Hx    Rectal cancer Neg Hx    Breast cancer Neg Hx     CURRENT MEDICATIONS:  Outpatient Encounter Medications as of 03/19/2024  Medication Sig   albuterol  (VENTOLIN  HFA) 108 (90 Base) MCG/ACT inhaler Inhale 2 puffs into the lungs every 6 (six) hours as needed for wheezing or shortness of breath.   ALPRAZolam  (XANAX ) 0.25 MG tablet Take 1/2-1 tablet once daily as needed for severe anxiety/ panic attack. May cause hallucinations/ falls   ascorbic acid (VITAMIN C) 500 MG tablet Take 500 mg by mouth daily.   Biotin 5000 MCG SUBL Place under the tongue.   Blood Glucose Monitoring Suppl (ONETOUCH VERIO FLEX SYSTEM) w/Device KIT Check Bs up to 4 times daily Dx E11.9,   Cholecalciferol (VITAMIN D3) 5000 units CAPS Take 5,000 Units by mouth  daily.   cyclobenzaprine  (FLEXERIL ) 5 MG tablet TAKE 1 TABLET BY MOUTH THREE TIMES DAILY AS NEEDED FOR MUSCLE SPASM . APPOINTMENT REQUIRED FOR FUTURE REFILLS   diltiazem  (CARDIZEM  CD) 120 MG 24 hr capsule Take 1 capsule (120 mg total) by mouth every Monday, Wednesday, and Friday.   fluticasone  (FLONASE ) 50 MCG/ACT nasal spray Place 2 sprays into both nostrils daily.   furosemide  (LASIX ) 40 MG tablet Take 1 tablet (40 mg total) by mouth 2 (two) times daily.   glucose blood (ONETOUCH VERIO) test strip Check Bs up to 4 times daily Dx E11.9,   hydrocortisone  cream 1 % Apply 1 Application topically 2 (two) times daily. Apply a sparingly (thin film) to facial rash twice daily for up to a week   lactulose  (CHRONULAC ) 10 GM/15ML solution Take 15 mLs (10 g total) by mouth 3 (three) times daily. TAKE 15 ML BY MOUTH  THREE TIMES DAILY AS NEEDED   levothyroxine  (SYNTHROID ) 25 MCG tablet Take 1 tablet (25 mcg total) by mouth daily.   lidocaine  (LIDODERM ) 5 % Apply 1 patch to area of pain daily as needed. Remove & Discard patch within 12 hours or as directed by MD   meclizine  (ANTIVERT ) 25 MG tablet Take  1 tablet (25 mg total) by mouth daily as needed for dizziness.   Misc Natural Products (BEET ROOT PO) Take 1 Capful by mouth daily.   ondansetron  (ZOFRAN -ODT) 4 MG disintegrating tablet DISSOLVE 1 TABLET IN MOUTH EVERY 6 HOURS AS NEEDED FOR NAUSEA AND VOMITING   ONETOUCH DELICA LANCETS 33G MISC USE ONE LANCET TO CHECK GLUCOSE ONCE DAILY AND AS NEEDED   spironolactone  (ALDACTONE ) 25 MG tablet Take 1 tablet by mouth once daily   spironolactone  (ALDACTONE ) 50 MG tablet Take 0.5 tablets (25 mg total) by mouth daily.   traMADol  (ULTRAM ) 50 MG tablet Take 1 tablet (50 mg total) by mouth daily as needed.   XIFAXAN  550 MG TABS tablet TAKE ONE TABLET BY MOUTH TWICE A DAY   No facility-administered encounter medications on file as of 03/19/2024.    ALLERGIES:  Allergies  Allergen Reactions   Latex Other (See  Comments)    my hands feels numb    LABORATORY DATA:  I have reviewed the labs as listed.  CBC    Component Value Date/Time   WBC 5.2 03/12/2024 1338   RBC 3.83 (L) 03/12/2024 1338   HGB 13.1 03/12/2024 1338   HGB 13.7 11/22/2023 1102   HCT 39.9 03/12/2024 1338   HCT 38.9 11/22/2023 1102   PLT 106 (L) 03/12/2024 1338   PLT 101 (L) 11/22/2023 1102   MCV 104.2 (H) 03/12/2024 1338   MCV 98 (H) 11/22/2023 1102   MCH 34.2 (H) 03/12/2024 1338   MCHC 32.8 03/12/2024 1338   RDW 13.6 03/12/2024 1338   RDW 12.2 11/22/2023 1102   LYMPHSABS 1.4 03/12/2024 1338   LYMPHSABS 0.7 09/04/2023 1426   MONOABS 0.5 03/12/2024 1338   EOSABS 0.2 03/12/2024 1338   EOSABS 0.0 09/04/2023 1426   BASOSABS 0.1 03/12/2024 1338   BASOSABS 0.0 09/04/2023 1426      Latest Ref Rng & Units 03/12/2024    1:38 PM 01/12/2024    9:47 AM 12/21/2023   11:08 AM  CMP  Glucose 70 - 99 mg/dL 818  808  871   BUN 8 - 23 mg/dL 22  25  38   Creatinine 0.44 - 1.00 mg/dL 8.69  8.67  8.53   Sodium 135 - 145 mmol/L 137  140  140   Potassium 3.5 - 5.1 mmol/L 4.6  3.7  4.7   Chloride 98 - 111 mmol/L 98  99  98   CO2 22 - 32 mmol/L 29  27  28    Calcium  8.9 - 10.3 mg/dL 8.9  9.0  89.6   Total Protein 6.5 - 8.1 g/dL 5.6  5.5    Total Bilirubin 0.0 - 1.2 mg/dL 5.2  2.9    Alkaline Phos 38 - 126 U/L 92  146    AST 15 - 41 U/L 43  46    ALT 0 - 44 U/L 25  24      DIAGNOSTIC IMAGING:  I have independently reviewed the relevant imaging and discussed with the patient.   WRAP UP:  All questions were answered. The patient knows to call the clinic with any problems, questions or concerns.  Medical decision making: Low  Time spent on visit: I spent 15 minutes counseling the patient face to face. The total time spent in the appointment was 22 minutes and more than 50% was on counseling.  Pleasant CHRISTELLA Barefoot, PA-C  03/19/24 2:19 PM

## 2024-03-19 ENCOUNTER — Inpatient Hospital Stay: Payer: PPO | Attending: Hematology | Admitting: Physician Assistant

## 2024-03-19 ENCOUNTER — Inpatient Hospital Stay: Payer: PPO

## 2024-03-19 DIAGNOSIS — K7460 Unspecified cirrhosis of liver: Secondary | ICD-10-CM | POA: Diagnosis not present

## 2024-03-19 DIAGNOSIS — G4730 Sleep apnea, unspecified: Secondary | ICD-10-CM | POA: Diagnosis not present

## 2024-03-19 DIAGNOSIS — I890 Lymphedema, not elsewhere classified: Secondary | ICD-10-CM | POA: Diagnosis not present

## 2024-03-19 DIAGNOSIS — G8929 Other chronic pain: Secondary | ICD-10-CM | POA: Insufficient documentation

## 2024-03-19 DIAGNOSIS — Z800 Family history of malignant neoplasm of digestive organs: Secondary | ICD-10-CM | POA: Diagnosis not present

## 2024-03-19 DIAGNOSIS — J449 Chronic obstructive pulmonary disease, unspecified: Secondary | ICD-10-CM | POA: Insufficient documentation

## 2024-03-19 DIAGNOSIS — Z860101 Personal history of adenomatous and serrated colon polyps: Secondary | ICD-10-CM | POA: Diagnosis not present

## 2024-03-19 DIAGNOSIS — Z79899 Other long term (current) drug therapy: Secondary | ICD-10-CM | POA: Diagnosis not present

## 2024-03-19 DIAGNOSIS — Z8042 Family history of malignant neoplasm of prostate: Secondary | ICD-10-CM | POA: Insufficient documentation

## 2024-03-19 DIAGNOSIS — E611 Iron deficiency: Secondary | ICD-10-CM | POA: Diagnosis not present

## 2024-03-19 DIAGNOSIS — E538 Deficiency of other specified B group vitamins: Secondary | ICD-10-CM

## 2024-03-19 DIAGNOSIS — Z806 Family history of leukemia: Secondary | ICD-10-CM | POA: Diagnosis not present

## 2024-03-19 DIAGNOSIS — M797 Fibromyalgia: Secondary | ICD-10-CM | POA: Insufficient documentation

## 2024-03-19 DIAGNOSIS — M1990 Unspecified osteoarthritis, unspecified site: Secondary | ICD-10-CM | POA: Insufficient documentation

## 2024-03-19 DIAGNOSIS — Z87891 Personal history of nicotine dependence: Secondary | ICD-10-CM | POA: Diagnosis not present

## 2024-03-19 DIAGNOSIS — K7469 Other cirrhosis of liver: Secondary | ICD-10-CM | POA: Insufficient documentation

## 2024-03-19 DIAGNOSIS — Z79890 Hormone replacement therapy (postmenopausal): Secondary | ICD-10-CM | POA: Diagnosis not present

## 2024-03-19 DIAGNOSIS — E785 Hyperlipidemia, unspecified: Secondary | ICD-10-CM | POA: Diagnosis not present

## 2024-03-19 DIAGNOSIS — D696 Thrombocytopenia, unspecified: Secondary | ICD-10-CM | POA: Insufficient documentation

## 2024-03-19 DIAGNOSIS — E119 Type 2 diabetes mellitus without complications: Secondary | ICD-10-CM | POA: Diagnosis not present

## 2024-03-19 DIAGNOSIS — I10 Essential (primary) hypertension: Secondary | ICD-10-CM | POA: Diagnosis not present

## 2024-03-19 MED ORDER — CYANOCOBALAMIN 1000 MCG/ML IJ SOLN
1000.0000 ug | Freq: Once | INTRAMUSCULAR | Status: AC
  Administered 2024-03-19: 1000 ug via INTRAMUSCULAR
  Filled 2024-03-19: qty 1

## 2024-03-19 NOTE — Patient Instructions (Signed)
 [facility] [place] - A DEPT OF [facility]  Discharge Instructions: Thank you for choosing [facility] to provide your oncology and hematology care.  If you have a lab appointment with the Cancer Center - please note that after April 8th, 2024, all labs will be drawn in the cancer center.  You do not have to check in or register with the main entrance as you have in the past but will complete your check-in in the cancer center.  Wear comfortable clothing and clothing appropriate for easy access to any Portacath or PICC line.   We strive to give you quality time with your provider. You may need to reschedule your appointment if you arrive late (15 or more minutes).  Arriving late affects you and other patients whose appointments are after yours.  Also, if you miss three or more appointments without notifying the office, you may be dismissed from the clinic at the provider's discretion.      For prescription refill requests, have your pharmacy contact our office and allow 72 hours for refills to be completed.    Today you received the following: B12 injection   To help prevent nausea and vomiting after your treatment, we encourage you to take your nausea medication as directed.  BELOW ARE SYMPTOMS THAT SHOULD BE REPORTED IMMEDIATELY: *FEVER GREATER THAN 100.4 F (38 C) OR HIGHER *CHILLS OR SWEATING *NAUSEA AND VOMITING THAT IS NOT CONTROLLED WITH YOUR NAUSEA MEDICATION *UNUSUAL SHORTNESS OF BREATH *UNUSUAL BRUISING OR BLEEDING *URINARY PROBLEMS (pain or burning when urinating, or frequent urination) *BOWEL PROBLEMS (unusual diarrhea, constipation, pain near the anus) TENDERNESS IN MOUTH AND THROAT WITH OR WITHOUT PRESENCE OF ULCERS (sore throat, sores in mouth, or a toothache) UNUSUAL RASH, SWELLING OR PAIN  UNUSUAL VAGINAL DISCHARGE OR ITCHING   Items with * indicate a potential emergency and should be followed up as soon as possible or go to the  Emergency Department if any problems should occur.  Please show the CHEMOTHERAPY ALERT CARD or IMMUNOTHERAPY ALERT CARD at check-in to the Emergency Department and triage nurse.  Should you have questions after your visit or need to cancel or reschedule your appointment, please contact [facility] [place] - A DEPT OF Eligha Bridegroom [facility] [phone]  and follow the prompts.  Office hours are 8:00 a.m. to 4:30 p.m. Monday - Friday. Please note that voicemails left after 4:00 p.m. may not be returned until the following business day.  We are closed weekends and major holidays. You have access to a nurse at all times for urgent questions. Please call the main number to the clinic [phone] and follow the prompts.  For any non-urgent questions, you may also contact your provider using MyChart. We now offer e-Visits for anyone 66 and older to request care online for non-urgent symptoms. For details visit [email or web address].   Also download the MyChart app! Go to the app store, search "MyChart", open the app, select [facility], and log in with your MyChart username and password.

## 2024-03-19 NOTE — Patient Instructions (Signed)
 [facility] at [facility] Discharge Instructions  You were seen today by Pleasant Barefoot PA-C for your low platelets.  As we discussed, your low platelets are due to your underlying liver disease.  They remain low, but stable at their baseline.  You do not need any treatment for low platelets at this time.  You should continue to receive B12 injections, but we will decrease this to every 8 weeks.  You do not need any iron supplements at this time.  FOLLOW-UP APPOINTMENT: 1 year   ** Thank you for trusting me with your healthcare!  I strive to provide all of my patients with quality care at each visit.  If you receive a survey for this visit, I would be so grateful to you for taking the time to provide feedback.  Thank you in advance!  ~ Melea Prezioso                   Dr. Alean Stands   &   Pleasant Barefoot, PA-C   - - - - - - - - - - - - - - - - - -     Thank you for choosing [facility] at [facility] to provide your oncology and hematology care.  To afford each patient quality time with our provider, please arrive at least 15 minutes before your scheduled appointment time.   If you have a lab appointment with the Cancer Center please come in thru the Main Entrance and check in at the main information desk.  You need to re-schedule your appointment should you arrive 10 or more minutes late.  We strive to give you quality time with our providers, and arriving late affects you and other patients whose appointments are after yours.  Also, if you no show three or more times for appointments you may be dismissed from the clinic at the providers discretion.     Again, thank you for choosing [facility].  Our hope is that these requests will decrease the amount of time that you wait before being seen by our physicians.       _____________________________________________________________  Should you have questions after your visit to  [facility], please contact our office at [phone] and follow the prompts.  Our office hours are 8:00 a.m. and 4:30 p.m. Monday - Friday.  Please note that voicemails left after 4:00 p.m. may not be returned until the following business day.  We are closed weekends and major holidays.  You do have access to a nurse 24-7, just call the main number to the clinic [phone] and do not press any options, hold on the line and a nurse will answer the phone.    For prescription refill requests, have your pharmacy contact our office and allow 72 hours.    Due to Covid, you will need to wear a mask upon entering the hospital. If you do not have a mask, a mask will be given to you at the Main Entrance upon arrival. For doctor visits, patients may have 1 support person age 101 or older with them. For treatment visits, patients can not have anyone with them due to social distancing guidelines and our immunocompromised population.

## 2024-03-19 NOTE — Progress Notes (Signed)
 Patient presents today for office visit with Pleasant Barefoot PA and B12 injection. Patient tolerated injection in left deltoid well with no complaints voiced.  Site clean and dry with no bruising or swelling noted.  No complaints of pain.  Discharged with vital signs stable and no signs or symptoms of distress noted.

## 2024-04-01 ENCOUNTER — Encounter: Payer: Self-pay | Admitting: Family Medicine

## 2024-04-01 ENCOUNTER — Other Ambulatory Visit: Payer: Self-pay | Admitting: Gastroenterology

## 2024-04-01 ENCOUNTER — Ambulatory Visit: Admitting: Family Medicine

## 2024-04-01 VITALS — BP 149/63 | HR 52 | Temp 98.0°F | Ht 61.0 in | Wt 224.0 lb

## 2024-04-01 DIAGNOSIS — J410 Simple chronic bronchitis: Secondary | ICD-10-CM

## 2024-04-01 DIAGNOSIS — E785 Hyperlipidemia, unspecified: Secondary | ICD-10-CM

## 2024-04-01 DIAGNOSIS — K7581 Nonalcoholic steatohepatitis (NASH): Secondary | ICD-10-CM | POA: Diagnosis not present

## 2024-04-01 DIAGNOSIS — Z0000 Encounter for general adult medical examination without abnormal findings: Secondary | ICD-10-CM

## 2024-04-01 DIAGNOSIS — K130 Diseases of lips: Secondary | ICD-10-CM

## 2024-04-01 DIAGNOSIS — E1159 Type 2 diabetes mellitus with other circulatory complications: Secondary | ICD-10-CM

## 2024-04-01 DIAGNOSIS — K7460 Unspecified cirrhosis of liver: Secondary | ICD-10-CM

## 2024-04-01 DIAGNOSIS — E1169 Type 2 diabetes mellitus with other specified complication: Secondary | ICD-10-CM

## 2024-04-01 DIAGNOSIS — N1832 Chronic kidney disease, stage 3b: Secondary | ICD-10-CM

## 2024-04-01 DIAGNOSIS — R7989 Other specified abnormal findings of blood chemistry: Secondary | ICD-10-CM

## 2024-04-01 DIAGNOSIS — E1122 Type 2 diabetes mellitus with diabetic chronic kidney disease: Secondary | ICD-10-CM | POA: Diagnosis not present

## 2024-04-01 DIAGNOSIS — I152 Hypertension secondary to endocrine disorders: Secondary | ICD-10-CM | POA: Diagnosis not present

## 2024-04-01 DIAGNOSIS — E039 Hypothyroidism, unspecified: Secondary | ICD-10-CM | POA: Diagnosis not present

## 2024-04-01 DIAGNOSIS — Z5309 Procedure and treatment not carried out because of other contraindication: Secondary | ICD-10-CM

## 2024-04-01 LAB — BAYER DCA HB A1C WAIVED: HB A1C (BAYER DCA - WAIVED): 4.9 % (ref 4.8–5.6)

## 2024-04-01 NOTE — Progress Notes (Signed)
 Stacey Summers is a 78 y.o. female presents to office today for annual physical exam examination.    Concerns today include: 1. 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  She had her eye exam done with Dr Ladora at [facility]. She continues to watch her diet but notes that her family prepares most of her meals.  She reports to me she soaks her feet in epsom salt sometimes but wants to get Dr Roddie to do her nails again because she recently got cut.  Last eye exam: need Last foot exam: needs Last A1c:  Lab Results  Component Value Date   HGBA1C 5.0 11/22/2023   Nephropathy screen indicated?: needs Last flu, zoster and/or pneumovax:  Immunization History  Administered Date(s) Administered   Hepb-cpg 07/19/2018, 08/20/2018   Influenza, High Dose Seasonal PF 08/03/2018   Influenza,inj,Quad PF,6+ Mos 05/28/2014, 06/16/2015, 06/10/2016, 08/05/2020   Influenza,inj,quad, With Preservative 05/15/2017, 08/03/2018   Influenza-Unspecified 04/24/2017   Pneumococcal Conjugate-13 07/21/2014   Pneumococcal Polysaccharide-23 09/21/2015   Tdap 07/07/2009   Zoster Recombinant(Shingrix) 04/29/2017, 07/12/2017    ROS: no CP, SOB, falls.  Has chronic LE edema but this is looking better compared to normal.   Occupation: retired, Substance use: none Health Maintenance Due  Topic Date Due   FOOT EXAM  11/25/2023   Medicare Annual Wellness (AWV)  12/16/2023   MAMMOGRAM  12/26/2023   OPHTHALMOLOGY EXAM  01/19/2024   Diabetic kidney evaluation - Urine ACR  03/26/2024   INFLUENZA VACCINE  03/15/2024   Refills needed today: none  Immunization History  Administered Date(s) Administered   Hepb-cpg 07/19/2018, 08/20/2018   Influenza, High Dose Seasonal PF 08/03/2018   Influenza,inj,Quad PF,6+ Mos 05/28/2014, 06/16/2015, 06/10/2016, 08/05/2020   Influenza,inj,quad, With Preservative 05/15/2017, 08/03/2018    Influenza-Unspecified 04/24/2017   Pneumococcal Conjugate-13 07/21/2014   Pneumococcal Polysaccharide-23 09/21/2015   Tdap 07/07/2009   Zoster Recombinant(Shingrix) 04/29/2017, 07/12/2017   Past Medical History:  Diagnosis Date   Allergy    seasonal   Anxiety    Arthritis    hands, back    B12 deficiency 03/31/2021   Cataract    surgery to remove   Chronic back pain    lower back    Cirrhosis of liver without ascites (HCC)    recent dx 07/11/18   Closed fracture of right tibial plateau 01/31/2018   Complex tear of medial meniscus of right knee 03/27/2018   COPD (chronic obstructive pulmonary disease) (HCC)    no inhaler   Diabetes mellitus without complication (HCC)    DJD (degenerative joint disease)    Fibromyalgia    Genetic testing 03/08/2017   Ms. Vasconez underwent genetic counseling and testing for hereditary cancer syndromes on 02/21/2017. Her results were negative for mutations in all 83 genes analyzed by [facility]'s 83-gene Multi-Cancers Panel. 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   History of abnormal mammogram    History of arthroplasty of right knee 07/06/2018   Hyperlipidemia    Hypertension    Irregular heart beat    Neuromuscular disorder (HCC)    fibromyalgia   Sleep apnea    cpap some   Stress fracture of right tibia 03/27/2018   SVD (  spontaneous vaginal delivery)    x 2   Tubular adenoma of colon 05/09/12   innumerable number of flat polyps   Social History   Socioeconomic History   Marital status: Married    Spouse name: Lamar goes by [employer]   Number of children: 3   Years of education: 11   Highest education level: GED or equivalent  Occupational History   Occupation: retired  Tobacco Use   Smoking  status: Former    Current packs/day: 0.00    Average packs/day: 1 pack/day for 3.0 years (3.0 ttl pk-yrs)    Types: Cigarettes    Start date: 08/15/1966    Quit date: 08/15/1969    Years since quitting: 54.6   Smokeless tobacco: Never  Vaping Use   Vaping status: Never Used  Substance and Sexual Activity   Alcohol use: No    Alcohol/week: 0.0 standard drinks of alcohol   Drug use: No   Sexual activity: Not Currently  Other Topics Concern   Not on file  Social History Narrative   Daughter lives with them and helps a lot   Social Drivers of Health   Financial Resource Strain: Low Risk  (12/16/2022)   Overall Financial Resource Strain (CARDIA)    Difficulty of Paying Living Expenses: Not hard at all  Food Insecurity: No Food Insecurity (07/04/2023)   Hunger Vital Sign    Worried About Running Out of Food in the Last Year: Never true    Ran Out of Food in the Last Year: Never true  Transportation Needs: No Transportation Needs (07/04/2023)   PRAPARE - Administrator, Civil Service (Medical): No    Lack of Transportation (Non-Medical): No  Physical Activity: Inactive (12/16/2022)   Exercise Vital Sign    Days of Exercise per Week: 0 days    Minutes of Exercise per Session: 0 min  Stress: No Stress Concern Present (12/16/2022)   [employer] of Occupational Health - Occupational Stress Questionnaire    Feeling of Stress : Not at all  Social Connections: Socially Integrated (12/16/2022)   Social Connection and Isolation Panel    Frequency of Communication with Friends and Family: More than three times a week    Frequency of Social Gatherings with Friends and Family: More than three times a week    Attends Religious Services: More than 4 times per year    Active Member of Clubs or Organizations: Yes    Attends Banker Meetings: More than 4 times per year    Marital Status: Married  Catering manager Violence: Not At Risk (07/04/2023)   Humiliation, Afraid,  Rape, and Kick questionnaire    Fear of Current or Ex-Partner: No    Emotionally Abused: No    Physically Abused: No    Sexually Abused: No   Past Surgical History:  Procedure Laterality Date   CARPAL TUNNEL RELEASE Left 10/20/14   [facility]   CATARACT EXTRACTION, BILATERAL     CESAREAN SECTION  1977   CHOLECYSTECTOMY  1990   COLONOSCOPY     IR RADIOLOGIST EVAL & MGMT  07/16/2019   KNEE ARTHROSCOPY WITH SUBCHONDROPLASTY Right 03/27/2018   Procedure: Right knee arthroscopic partial medial meniscus repair versus menisectomy with medial tibia subchondroplasty;  Surgeon: Sharl Selinda Dover, MD;  Location: [facility] OR;  Service: Orthopedics;  Laterality: Right;  120 mins   LARYNX SURGERY     POLYP REMOVAL   MULTIPLE TOOTH EXTRACTIONS     POLYPECTOMY  vocal cord   TONSILLECTOMY  1965   TUBAL LIGATION     TUMOR REMOVAL     benign fatty tumors on abdomen   UPPER GASTROINTESTINAL ENDOSCOPY     Family History  Problem Relation Age of Onset   Heart failure Mother        d.37   Heart disease Mother    Prostate cancer Father        d.89 diagnosed in late-60s   Colon cancer Father        diagnosed at 24   Leukemia Maternal Aunt    Heart failure Maternal Uncle        d.50   Stomach cancer Paternal Aunt        questionable   Colon cancer Paternal Uncle        in his 50s   Heart failure Maternal Grandmother        d.45   Kidney cancer Cousin        d.78s maternal first-cousin   Colon polyps Brother        polyposis. Underwent partial colectomy at age 22.   Spina bifida Brother        died at birth   Colon polyps Son 39   Heart attack Son 69   Other Other 16       multiple lipomas   Esophageal cancer Neg Hx    Rectal cancer Neg Hx    Breast cancer Neg Hx     Current Outpatient Medications:    albuterol  (VENTOLIN  HFA) 108 (90 Base) MCG/ACT inhaler, Inhale 2 puffs into the lungs every 6 (six) hours as needed for wheezing or shortness of breath., Disp: 18 g, Rfl: 1   ALPRAZolam   (XANAX ) 0.25 MG tablet, Take 1/2-1 tablet once daily as needed for severe anxiety/ panic attack. May cause hallucinations/ falls, Disp: 30 tablet, Rfl: 0   ascorbic acid (VITAMIN C) 500 MG tablet, Take 500 mg by mouth daily., Disp: , Rfl:    Biotin 5000 MCG SUBL, Place under the tongue., Disp: , Rfl:    Blood Glucose Monitoring Suppl (ONETOUCH VERIO FLEX SYSTEM) w/Device KIT, Check Bs up to 4 times daily Dx E11.9,, Disp: 1 kit, Rfl: 0   Cholecalciferol (VITAMIN D3) 5000 units CAPS, Take 5,000 Units by mouth daily., Disp: , Rfl:    cyclobenzaprine  (FLEXERIL ) 5 MG tablet, TAKE 1 TABLET BY MOUTH THREE TIMES DAILY AS NEEDED FOR MUSCLE SPASM . APPOINTMENT REQUIRED FOR FUTURE REFILLS, Disp: 30 tablet, Rfl: 0   diltiazem  (CARDIZEM  CD) 120 MG 24 hr capsule, Take 1 capsule (120 mg total) by mouth every Monday, Wednesday, and Friday., Disp: 45 capsule, Rfl: 2   fluticasone  (FLONASE ) 50 MCG/ACT nasal spray, Place 2 sprays into both nostrils daily., Disp: 16 g, Rfl: 12   furosemide  (LASIX ) 40 MG tablet, Take 1 tablet (40 mg total) by mouth 2 (two) times daily., Disp: 270 tablet, Rfl: 2   glucose blood (ONETOUCH VERIO) test strip, Check Bs up to 4 times daily Dx E11.9,, Disp: 400 each, Rfl: 3   hydrocortisone  cream 1 %, Apply 1 Application topically 2 (two) times daily. Apply a sparingly (thin film) to facial rash twice daily for up to a week, Disp: 30 g, Rfl: 0   lactulose  (CHRONULAC ) 10 GM/15ML solution, Take 15 mLs (10 g total) by mouth 3 (three) times daily. TAKE 15 ML BY MOUTH  THREE TIMES DAILY AS NEEDED, Disp: 1350 mL, Rfl: 3   levothyroxine  (SYNTHROID ) 25 MCG tablet, Take  1 tablet (25 mcg total) by mouth daily., Disp: 90 tablet, Rfl: 3   lidocaine  (LIDODERM ) 5 %, Apply 1 patch to area of pain daily as needed. Remove & Discard patch within 12 hours or as directed by MD, Disp: 90 patch, Rfl: 3   meclizine  (ANTIVERT ) 25 MG tablet, Take 1 tablet (25 mg total) by mouth daily as needed for dizziness., Disp: 90  tablet, Rfl: 0   Misc Natural Products (BEET ROOT PO), Take 1 Capful by mouth daily., Disp: , Rfl:    ondansetron  (ZOFRAN -ODT) 4 MG disintegrating tablet, DISSOLVE 1 TABLET IN MOUTH EVERY 6 HOURS AS NEEDED FOR NAUSEA AND VOMITING, Disp: 20 tablet, Rfl: 0   ONETOUCH DELICA LANCETS 33G MISC, USE ONE LANCET TO CHECK GLUCOSE ONCE DAILY AND AS NEEDED, Disp: 100 each, Rfl: 0   spironolactone  (ALDACTONE ) 25 MG tablet, Take 1 tablet by mouth once daily, Disp: 90 tablet, Rfl: 3   spironolactone  (ALDACTONE ) 50 MG tablet, Take 0.5 tablets (25 mg total) by mouth daily., Disp: 45 tablet, Rfl: 1   traMADol  (ULTRAM ) 50 MG tablet, Take 1 tablet (50 mg total) by mouth daily as needed., Disp: 30 tablet, Rfl: 5   XIFAXAN  550 MG TABS tablet, TAKE ONE TABLET BY MOUTH TWICE A DAY, Disp: 180 tablet, Rfl: 2  Allergies  Allergen Reactions   Latex Other (See Comments)    my hands feels numb     ROS: Review of Systems Pertinent items noted in HPI and remainder of comprehensive ROS otherwise negative.    Physical exam BP (!) 149/63   Pulse (!) 52   Temp 98 F (36.7 C)   Ht 5' 1 (1.549 m)   Wt 224 lb (101.6 kg)   SpO2 97%   BMI 42.32 kg/m  General appearance: alert, cooperative, appears stated age, no distress, and morbidly obese Head: Normocephalic, without obvious abnormality, atraumatic Eyes: negative findings: lids and lashes normal, conjunctivae and sclerae normal, corneas clear, and pupils equal, round, reactive to light and accomodation Ears: normal TM's and external ear canals both ears Nose: Nares normal. Septum midline. Mucosa normal. No drainage or sinus tenderness. Throat: lips, mucosa, and tongue normal; teeth and gums normal Neck: no adenopathy, supple, symmetrical, trachea midline, and thyroid  not enlarged, symmetric, no tenderness/mass/nodules Back: symmetric, no curvature. ROM normal. No CVA tenderness. Lungs: clear to auscultation bilaterally Heart: Regular rate and rhythm.  Systolic  murmur present.  This radiates to carotids bilaterally. Abdomen: obese, soft, NT Extremities: +1 pitting edema to mid shins bilaterally Pulses: 2+ and symmetric Skin: Has a papular skin lesion on the right lower lip Lymph nodes: Cervical, supraclavicular, and axillary nodes normal. Neurologic: Very hard of hearing.  Otherwise seems to be neurologically intact     04/01/2024    1:01 PM 03/19/2024    1:40 PM 01/12/2024    9:06 AM  Depression screen PHQ 2/9  Decreased Interest 0 0 0  Down, Depressed, Hopeless 0 0 0  PHQ - 2 Score 0 0 0  Altered sleeping 1  0  Tired, decreased energy 1  0  Change in appetite 0  0  Feeling bad or failure about yourself  0  0  Trouble concentrating 0  0  Moving slowly or fidgety/restless 0  0  Suicidal thoughts 0  0  PHQ-9 Score 2  0  Difficult doing work/chores   Not difficult at all      04/01/2024    1:01 PM 01/12/2024    9:06 AM 11/22/2023  10:14 AM 07/31/2023   12:58 PM  GAD 7 : Generalized Anxiety Score  Nervous, Anxious, on Edge 1 0 0 0  Control/stop worrying 0 0 0 0  Worry too much - different things 0 0 0 0  Trouble relaxing 1 0 0 0  Restless 0 0 0 0  Easily annoyed or irritable 0 0 0 0  Afraid - awful might happen 0 0 0 0  Total GAD 7 Score 2 0 0 0  Anxiety Difficulty Somewhat difficult Not difficult at all Not difficult at all    Diabetic Foot Exam - Simple   Simple Foot Form Diabetic Foot exam was performed with the following findings: Yes 04/01/2024  1:56 PM  Visual Inspection See comments: Yes Sensation Testing Intact to touch and monofilament testing bilaterally: Yes Pulse Check Posterior Tibialis and Dorsalis pulse intact bilaterally: Yes Comments Trace to +1 pitting edema of the legs to shins bilaterally.  +1 pedal pulses bilaterally.  Onychomycotic changes to the nails bilaterally.      Assessment/ Plan: Stacey Summers here for annual physical exam.   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  chronic kidney disease, without long-term current use of insulin  (HCC) - Plan: Microalbumin / creatinine urine ratio, Bayer DCA Hb A1c Waived, Ambulatory referral to Podiatry  Hyperlipidemia associated with type 2 diabetes mellitus (HCC) - Plan: Lipid Panel  Liver cirrhosis secondary to NASH (HCC)  Contraindication to statin medication  Hypertension associated with diabetes (HCC)  Simple chronic bronchitis (HCC)  Low serum vitamin D  - Plan: VITAMIN D  25 Hydroxy (Vit-D Deficiency, Fractures)  Acquired hypothyroidism - Plan: TSH + free T4  Lesion of lip - Plan: Ambulatory referral to Dermatology  Diet controlled DM. Foot exam performed.  Referral to podiatry.  Labs collected.  Urine microalbumin performed.  Will get release of information from [facility] for diabetic eye exam  Contraindication to use statins given cirrhosis.  I highly recommended that she discontinue use of Epsom salts given her cirrhosis of the liver.  Soapy water soaks okay.  Referral to podiatry as above  Blood pressure controlled for age.  No changes needed  Lung exam unremarkable today  Check vitamin D , thyroid  levels.  Referral to dermatology for papular skin lesion of the right lower lip  Counseled on healthy lifestyle choices, including diet (rich in fruits, vegetables and lean meats and low in salt and simple carbohydrates) and exercise (at least 30 minutes of moderate physical activity daily).  Patient to follow up 78m for DM  Rutha Melgoza M. Jolinda, DO

## 2024-04-02 ENCOUNTER — Ambulatory Visit: Payer: Self-pay | Admitting: Family Medicine

## 2024-04-02 ENCOUNTER — Ambulatory Visit

## 2024-04-02 VITALS — BP 149/63 | HR 52 | Ht 61.0 in | Wt 224.0 lb

## 2024-04-02 DIAGNOSIS — K7460 Unspecified cirrhosis of liver: Secondary | ICD-10-CM

## 2024-04-02 DIAGNOSIS — N1832 Chronic kidney disease, stage 3b: Secondary | ICD-10-CM

## 2024-04-02 DIAGNOSIS — Z0000 Encounter for general adult medical examination without abnormal findings: Secondary | ICD-10-CM | POA: Diagnosis not present

## 2024-04-02 DIAGNOSIS — E1159 Type 2 diabetes mellitus with other circulatory complications: Secondary | ICD-10-CM

## 2024-04-02 DIAGNOSIS — Z1231 Encounter for screening mammogram for malignant neoplasm of breast: Secondary | ICD-10-CM

## 2024-04-02 LAB — LIPID PANEL
Chol/HDL Ratio: 2.8 ratio (ref 0.0–4.4)
Cholesterol, Total: 166 mg/dL (ref 100–199)
HDL: 59 mg/dL (ref >39)
LDL Chol Calc (NIH): 91 mg/dL (ref 0–99)
Triglycerides: 85 mg/dL (ref 0–149)
VLDL Cholesterol Cal: 16 mg/dL (ref 5–40)

## 2024-04-02 LAB — MICROALBUMIN / CREATININE URINE RATIO
Creatinine, Urine: 67.8 mg/dL
Microalb/Creat Ratio: 93 mg/g{creat} — ABNORMAL HIGH (ref 0–29)
Microalbumin, Urine: 63.3 ug/mL

## 2024-04-02 LAB — TSH+FREE T4
Free T4: 1.33 ng/dL (ref 0.82–1.77)
TSH: 3.87 u[IU]/mL (ref 0.450–4.500)

## 2024-04-02 LAB — VITAMIN D 25 HYDROXY (VIT D DEFICIENCY, FRACTURES): Vit D, 25-Hydroxy: 65.9 ng/mL (ref 30.0–100.0)

## 2024-04-02 NOTE — Patient Instructions (Addendum)
 [name] , Thank you for taking time out of your busy schedule to complete your Annual Wellness Visit with me. I enjoyed our conversation and look forward to speaking with you again next year. I, as well as your care team,  appreciate your ongoing commitment to your health goals. Please review the following plan we discussed and let me know if I can assist you in the future. Your Game plan/ To Do List    Referrals: If you haven't heard from the office you've been referred to, please reach out to them at the phone provided.   Follow up Visits: We will see or speak with you next year for your Next Medicare AWV with our clinical staff on 04/03/25 at 1:10p.m. Have you seen your provider in the last 6 months (3 months if uncontrolled diabetes)? Yes  Clinician Recommendations:  Aim for 30 minutes of exercise or brisk walking, 6-8 glasses of water, and 5 servings of fruits and vegetables each day.       This is a list of the screenings recommended for you:  Health Maintenance  Topic Date Due   Medicare Annual Wellness Visit  12/16/2023   Mammogram  12/26/2023   DTaP/Tdap/Td vaccine (2 - Td or Tdap) 07/30/2024*   Colon Cancer Screening  07/30/2024*   Flu Shot  11/12/2024*   Hemoglobin A1C  10/02/2024   Eye exam for diabetics  01/04/2025   Yearly kidney function blood test for diabetes  03/12/2025   DEXA scan (bone density measurement)  03/26/2025   Yearly kidney health urinalysis for diabetes  04/01/2025   Complete foot exam   04/01/2025   Pneumococcal Vaccine for age over 38  Completed   Hepatitis C Screening  Completed   Zoster (Shingles) Vaccine  Completed   HPV Vaccine  Aged Out   Meningitis B Vaccine  Aged Out   Pneumococcal Vaccine  Discontinued   Hepatitis B Vaccine  Discontinued   COVID-19 Vaccine  Discontinued  *Topic was postponed. The date shown is not the original due date.    Advanced directives: (Declined) Advance directive discussed with you today. Even though you  declined this today, please call our office should you change your mind, and we can give you the proper paperwork for you to fill out. Advance Care Planning is important because it:  [x]  Makes sure you receive the medical care that is consistent with your values, goals, and preferences  [x]  It provides guidance to your family and loved ones and reduces their decisional burden about whether or not they are making the right decisions based on your wishes.  Follow the link provided in your after visit summary or read over the paperwork we have mailed to you to help you started getting your Advance Directives in place. If you need assistance in completing these, please reach out to us  so that we can help you!  See attachments for Preventive Care and Fall Prevention Tips.

## 2024-04-02 NOTE — Progress Notes (Signed)
 Subjective:   Stacey Summers is a 78 y.o. who presents for a Medicare Wellness preventive visit.  As a reminder, Annual Wellness Visits don't include a physical exam, and some assessments may be limited, especially if this visit is performed virtually. We may recommend an in-person follow-up visit with your provider if needed.  Visit Complete: Virtual I connected with  Stacey Summers on 04/02/24 by a audio enabled telemedicine application and verified that I am speaking with the correct person using two identifiers.  Patient Location: Home  Provider Location: Home Office  I discussed the limitations of evaluation and management by telemedicine. The patient expressed understanding and agreed to proceed.  Vital Signs: Because this visit was a virtual/telehealth visit, some criteria may be missing or patient reported. Any vitals not documented were not able to be obtained and vitals that have been documented are patient reported.  VideoDeclined- This patient declined Librarian, academic. Therefore the visit was completed with audio only.  Persons Participating in Visit: Patient assisted by pt's grand-daughter, Brittnay.  AWV Questionnaire: No: Patient Medicare AWV questionnaire was not completed prior to this visit.  Cardiac Risk Factors include: advanced age (>2men, >69 women);diabetes mellitus;dyslipidemia;hypertension;obesity (BMI >30kg/m2);smoking/ tobacco exposure     Objective:    Today's Vitals   04/02/24 1505  BP: (!) 149/63  Pulse: (!) 52  Weight: 224 lb (101.6 kg)  Height: 5' 1 (1.549 m)   Body mass index is 42.32 kg/m.     04/02/2024    3:10 PM 03/19/2024    1:47 PM 10/06/2023    9:33 AM 08/29/2023    8:34 AM 08/22/2023    3:06 PM 07/10/2023   12:39 PM 07/01/2023    3:35 PM  Advanced Directives  Does Patient Have a Medical Advance Directive? No No No No No No No  Would patient like information on creating a medical advance  directive?  No - Patient declined No - Patient declined No - Patient declined No - Patient declined No - Patient declined No - Patient declined    Current Medications (verified) Outpatient Encounter Medications as of 04/02/2024  Medication Sig   albuterol  (VENTOLIN  HFA) 108 (90 Base) MCG/ACT inhaler Inhale 2 puffs into the lungs every 6 (six) hours as needed for wheezing or shortness of breath.   ALPRAZolam  (XANAX ) 0.25 MG tablet Take 1/2-1 tablet once daily as needed for severe anxiety/ panic attack. May cause hallucinations/ falls   ascorbic acid (VITAMIN C) 500 MG tablet Take 500 mg by mouth daily.   Biotin 5000 MCG SUBL Place under the tongue.   Blood Glucose Monitoring Suppl (ONETOUCH VERIO FLEX SYSTEM) w/Device KIT Check Bs up to 4 times daily Dx E11.9,   Cholecalciferol (VITAMIN D3) 5000 units CAPS Take 5,000 Units by mouth daily.   cyclobenzaprine  (FLEXERIL ) 5 MG tablet TAKE 1 TABLET BY MOUTH THREE TIMES DAILY AS NEEDED FOR MUSCLE SPASM . APPOINTMENT REQUIRED FOR FUTURE REFILLS   diltiazem  (CARDIZEM  CD) 120 MG 24 hr capsule Take 1 capsule (120 mg total) by mouth every Monday, Wednesday, and Friday.   fluticasone  (FLONASE ) 50 MCG/ACT nasal spray Place 2 sprays into both nostrils daily.   furosemide  (LASIX ) 40 MG tablet Take 1 tablet (40 mg total) by mouth 2 (two) times daily.   glucose blood (ONETOUCH VERIO) test strip Check Bs up to 4 times daily Dx E11.9,   hydrocortisone  cream 1 % Apply 1 Application topically 2 (two) times daily. Apply a sparingly (thin film) to  facial rash twice daily for up to a week   lactulose  (CHRONULAC ) 10 GM/15ML solution TAKE 15 ML BY MOUTH  THREE TIMES DAILY AS NEEDED   levothyroxine  (SYNTHROID ) 25 MCG tablet Take 1 tablet (25 mcg total) by mouth daily.   lidocaine  (LIDODERM ) 5 % Apply 1 patch to area of pain daily as needed. Remove & Discard patch within 12 hours or as directed by MD   meclizine  (ANTIVERT ) 25 MG tablet Take 1 tablet (25 mg total) by mouth  daily as needed for dizziness.   Misc Natural Products (BEET ROOT PO) Take 1 Capful by mouth daily.   ondansetron  (ZOFRAN -ODT) 4 MG disintegrating tablet DISSOLVE 1 TABLET IN MOUTH EVERY 6 HOURS AS NEEDED FOR NAUSEA AND VOMITING   ONETOUCH DELICA LANCETS 33G MISC USE ONE LANCET TO CHECK GLUCOSE ONCE DAILY AND AS NEEDED   spironolactone  (ALDACTONE ) 25 MG tablet Take 1 tablet by mouth once daily   spironolactone  (ALDACTONE ) 50 MG tablet Take 0.5 tablets (25 mg total) by mouth daily.   traMADol  (ULTRAM ) 50 MG tablet Take 1 tablet (50 mg total) by mouth daily as needed.   XIFAXAN  550 MG TABS tablet TAKE ONE TABLET BY MOUTH TWICE A DAY   No facility-administered encounter medications on file as of 04/02/2024.    Allergies (verified) Latex   History: Past Medical History:  Diagnosis Date   Allergy    seasonal   Anxiety    Arthritis    hands, back    B12 deficiency 03/31/2021   Cataract    surgery to remove   Chronic back pain    lower back    Cirrhosis of liver without ascites (HCC)    recent dx 07/11/18   Closed fracture of right tibial plateau 01/31/2018   Complex tear of medial meniscus of right knee 03/27/2018   COPD (chronic obstructive pulmonary disease) (HCC)    no inhaler   Diabetes mellitus without complication (HCC)    DJD (degenerative joint disease)    Fibromyalgia    Genetic testing 03/08/2017   Ms. Lina underwent genetic counseling and testing for hereditary cancer syndromes on 02/21/2017. Her results were negative for mutations in all 83 genes analyzed by [facility]'s 83-gene Multi-Cancers Panel. 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   History of abnormal mammogram    History of arthroplasty of right knee 07/06/2018    Hyperlipidemia    Hypertension    Irregular heart beat    Neuromuscular disorder (HCC)    fibromyalgia   Sleep apnea    cpap some   Stress fracture of right tibia 03/27/2018   SVD (spontaneous vaginal delivery)    x 2   Tubular adenoma of colon 05/09/12   innumerable number of flat polyps   Past Surgical History:  Procedure Laterality Date   CARPAL TUNNEL RELEASE Left 10/20/14   [facility]   CATARACT EXTRACTION, BILATERAL     CESAREAN SECTION  1977   CHOLECYSTECTOMY  1990   COLONOSCOPY     IR RADIOLOGIST EVAL & MGMT  07/16/2019   KNEE ARTHROSCOPY WITH SUBCHONDROPLASTY Right 03/27/2018   Procedure: Right knee arthroscopic partial medial meniscus repair versus menisectomy with medial tibia subchondroplasty;  Surgeon: Sharl Selinda Dover, MD;  Location: [facility] OR;  Service: Orthopedics;  Laterality: Right;  120 mins   LARYNX SURGERY     POLYP REMOVAL   MULTIPLE TOOTH EXTRACTIONS     POLYPECTOMY     vocal cord   TONSILLECTOMY  1965   TUBAL LIGATION     TUMOR REMOVAL     benign fatty tumors on abdomen   UPPER GASTROINTESTINAL ENDOSCOPY     Family History  Problem Relation Age of Onset   Heart failure Mother        d.37   Heart disease Mother    Prostate cancer Father        d.89 diagnosed in late-60s   Colon cancer Father        diagnosed at 49   Leukemia Maternal Aunt    Heart failure Maternal Uncle        d.50   Stomach cancer Paternal Aunt        questionable   Colon cancer Paternal Uncle        in his 71s   Heart failure Maternal Grandmother        d.45   Kidney cancer Cousin        d.78s maternal first-cousin   Colon polyps Brother        polyposis. Underwent partial colectomy at age 76.   Spina bifida Brother        died at birth   Colon polyps Son 23   Heart attack Son 66   Other Other 16       multiple lipomas   Esophageal cancer Neg Hx    Rectal cancer Neg Hx    Breast cancer Neg Hx    Social History   Socioeconomic History   Marital status: Married     Spouse name: Lamar goes by [employer]   Number of children: 3   Years of education: 11   Highest education level: GED or equivalent  Occupational History   Occupation: retired  Tobacco Use   Smoking status: Former    Current packs/day: 0.00    Average packs/day: 1 pack/day for 3.0 years (3.0 ttl pk-yrs)    Types: Cigarettes    Start date: 08/15/1966    Quit date: 08/15/1969    Years since quitting: 54.6   Smokeless tobacco: Never  Vaping Use   Vaping status: Never Used  Substance and Sexual Activity   Alcohol use: No    Alcohol/week: 0.0 standard drinks of alcohol   Drug use: No   Sexual activity: Not Currently  Other Topics Concern   Not on file  Social History Narrative   Daughter lives with them and helps a lot   Social Drivers of Health   Financial Resource Strain: Low Risk  (04/02/2024)   Overall Financial Resource Strain (CARDIA)    Difficulty of Paying Living Expenses: Not hard at all  Food Insecurity: No Food Insecurity (04/02/2024)   Hunger Vital Sign    Worried About Running Out of Food in the Last Year: Never true    Ran Out of Food in the Last Year: Never true  Transportation Needs: No Transportation Needs (07/04/2023)   PRAPARE - Administrator, Civil Service (Medical): No    Lack of Transportation (Non-Medical): No  Physical Activity: Inactive (04/02/2024)   Exercise Vital Sign    Days of Exercise per Week: 0 days    Minutes of Exercise per Session: 0 min  Stress: Stress Concern Present (04/02/2024)   [employer] of Occupational Health - Occupational Stress Questionnaire  Feeling of Stress: To some extent  Social Connections: Moderately Integrated (04/02/2024)   Social Connection and Isolation Panel    Frequency of Communication with Friends and Family: More than three times a week    Frequency of Social Gatherings with Friends and Family: More than three times a week    Attends Religious Services: More than 4 times per year    Active  Member of [employer] or Organizations: No    Attends Engineer, structural: Never    Marital Status: Married    Tobacco Counseling Counseling given: Yes    Clinical Intake:  Pre-visit preparation completed: Yes  Pain : No/denies pain     BMI - recorded: 42.32 Nutritional Status: BMI > 30  Obese Nutritional Risks: None Diabetes: Yes  Lab Results  Component Value Date   HGBA1C 4.9 04/01/2024   HGBA1C 5.0 11/22/2023   HGBA1C 5.1 07/02/2023     How often do you need to have someone help you when you read instructions, pamphlets, or other written materials from your doctor or pharmacy?: 1 - Never  Interpreter Needed?: No  Information entered by :: alia t/cma   Activities of Daily Living     04/02/2024    3:09 PM 07/01/2023    5:28 PM  In your present state of health, do you have any difficulty performing the following activities:  Hearing? 1   Vision? 0   Difficulty concentrating or making decisions? 0 0  Walking or climbing stairs? 0   Dressing or bathing? 0   Doing errands, shopping? 0   Preparing Food and eating ? N   Using the Toilet? N   In the past six months, have you accidently leaked urine? Y   Do you have problems with loss of bowel control? Y   Managing your Medications? N   Managing your Finances? N   Housekeeping or managing your Housekeeping? N     Patient Care Team: Jolinda Norene HERO, DO as PCP - General (Family Medicine) Francyne Headland, MD as PCP - Cardiology (Cardiology) Christi Glendia HERO, MD as Consulting Physician (Pulmonary Disease) Nandigam, Kavitha V, MD as Consulting Physician (Gastroenterology) Vicci Mcardle, OD (Optometry) Rachele Gaynell RAMAN, MD as Consulting Physician (Nephrology)  I have updated your Care Teams any recent Medical Services you may have received from other providers in the past year.     Assessment:   This is a routine wellness examination for Jimia.  Hearing/Vision screen Hearing Screening - Comments::  Pt uses a hearing aids Vision Screening - Comments:: Pt wear glasses/pt [facility] in [place]/last ov 2025   Goals Addressed               This Visit's Progress     Patient Stated (pt-stated)   On track     Pt states  I would like to get to walking better       Depression Screen     04/02/2024    3:14 PM 04/01/2024    1:01 PM 03/19/2024    1:40 PM 01/12/2024    9:06 AM 11/22/2023   10:14 AM 07/31/2023   12:58 PM 03/27/2023    2:27 PM  PHQ 2/9 Scores  PHQ - 2 Score 3 0 0 0 0 0 0  PHQ- 9 Score 9 2  0 0 0 2    Fall Risk     04/02/2024    3:07 PM 01/12/2024    9:06 AM 11/22/2023   10:15 AM 07/31/2023   12:58  PM 03/27/2023    2:27 PM  Fall Risk   Falls in the past year? 0 0 0 0 0  Number falls in past yr: 0 0 0 0   Injury with Fall? 0 0 0 0   Risk for fall due to : No Fall Risks No Fall Risks No Fall Risks No Fall Risks   Follow up Falls evaluation completed Falls evaluation completed Falls evaluation completed Education provided Falls prevention discussed    MEDICARE RISK AT HOME:  Medicare Risk at Home Any stairs in or around the home?: Yes If so, are there any without handrails?: Yes Home free of loose throw rugs in walkways, pet beds, electrical cords, etc?: Yes Adequate lighting in your home to reduce risk of falls?: Yes Life alert?: No Use of a cane, walker or w/c?: Yes Grab bars in the bathroom?: No Shower chair or bench in shower?: Yes Elevated toilet seat or a handicapped toilet?: No  TIMED UP AND GO:  Was the test performed?  no  Cognitive Function: 6CIT completed    06/26/2017   10:38 AM  MMSE - Mini Mental State Exam  Orientation to time 5   Orientation to Place 5   Registration 3   Attention/ Calculation 5   Recall 2   Language- name 2 objects 2   Language- repeat 1  Language- follow 3 step command 3   Language- read & follow direction 1   Write a sentence 1   Copy design 1   Total score 29      Data saved with a previous flowsheet row  definition        04/02/2024    3:10 PM 12/16/2022    1:24 PM 02/10/2022    1:13 PM 02/01/2021   11:51 AM 01/31/2020    2:22 PM  6CIT Screen  What Year? 0 points 0 points 0 points 0 points 0 points  What month? 0 points 0 points 0 points 0 points 0 points  What time? 0 points 0 points 0 points 0 points 0 points  Count back from 20 0 points 0 points 0 points 0 points 2 points  Months in reverse 0 points 0 points 0 points 0 points 0 points  Repeat phrase 0 points 0 points 0 points 4 points 2 points  Total Score 0 points 0 points 0 points 4 points 4 points    Immunizations Immunization History  Administered Date(s) Administered   Hepb-cpg 07/19/2018, 08/20/2018   Influenza, High Dose Seasonal PF 08/03/2018   Influenza,inj,Quad PF,6+ Mos 05/28/2014, 06/16/2015, 06/10/2016, 08/05/2020   Influenza,inj,quad, With Preservative 05/15/2017, 08/03/2018   Influenza-Unspecified 04/24/2017   Pneumococcal Conjugate-13 07/21/2014   Pneumococcal Polysaccharide-23 09/21/2015   Tdap 07/07/2009   Zoster Recombinant(Shingrix) 04/29/2017, 07/12/2017    Screening Tests Health Maintenance  Topic Date Due   MAMMOGRAM  12/26/2023   DTaP/Tdap/Td (2 - Td or Tdap) 07/30/2024 (Originally 07/08/2019)   Colonoscopy  07/30/2024 (Originally 08/07/2021)   INFLUENZA VACCINE  11/12/2024 (Originally 03/15/2024)   HEMOGLOBIN A1C  10/02/2024   OPHTHALMOLOGY EXAM  01/04/2025   Diabetic kidney evaluation - eGFR measurement  03/12/2025   DEXA SCAN  03/26/2025   Diabetic kidney evaluation - Urine ACR  04/01/2025   FOOT EXAM  04/01/2025   Medicare Annual Wellness (AWV)  04/02/2025   Pneumococcal Vaccine: 50+ Years  Completed   Hepatitis C Screening  Completed   Zoster Vaccines- Shingrix  Completed   HPV VACCINES  Aged Out   Meningococcal B  Vaccine  Aged Out   Pneumococcal Vaccine  Discontinued   Hepatitis B Vaccines 19-59 Average Risk  Discontinued   COVID-19 Vaccine  Discontinued    Health  Maintenance  Health Maintenance Due  Topic Date Due   MAMMOGRAM  12/26/2023   Health Maintenance Items Addressed: See Nurse Notes at the end of this note  Additional Screening:  Vision Screening: Recommended annual ophthalmology exams for early detection of glaucoma and other disorders of the eye. Would you like a referral to an eye doctor? No    Dental Screening: Recommended annual dental exams for proper oral hygiene  Community Resource Referral / Chronic Care Management: CRR required this visit?  No   CCM required this visit?  No   Plan:    I have personally reviewed and noted the following in the patient's chart:   Medical and social history Use of alcohol, tobacco or illicit drugs  Current medications and supplements including opioid prescriptions. Patient is not currently taking opioid prescriptions. Functional ability and status Nutritional status Physical activity Advanced directives List of other physicians Hospitalizations, surgeries, and ER visits in previous 12 months Vitals Screenings to include cognitive, depression, and falls Referrals and appointments  In addition, I have reviewed and discussed with patient certain preventive protocols, quality metrics, and best practice recommendations. A written personalized care plan for preventive services as well as general preventive health recommendations were provided to patient.   Ozie Ned, CMA   04/02/2024   After Visit Summary: (MyChart) Due to this being a telephonic visit, the after visit summary with patients personalized plan was offered to patient via MyChart   Notes: Nothing significant to report at this time.

## 2024-04-03 DIAGNOSIS — H903 Sensorineural hearing loss, bilateral: Secondary | ICD-10-CM | POA: Diagnosis not present

## 2024-04-05 ENCOUNTER — Other Ambulatory Visit: Payer: Self-pay | Admitting: Family Medicine

## 2024-04-08 ENCOUNTER — Telehealth: Payer: Self-pay

## 2024-04-08 NOTE — Progress Notes (Signed)
 Care Guide Pharmacy Note  04/08/2024 Name: Stacey Summers MRN: [id number] DOB: Feb 24, 1946  Referred By: Jolinda Norene HERO, DO Reason for referral: Complex Care Management (Outreach to schedule with Pharm d )   Stacey Summers is a 78 y.o. year old female who is a primary care patient of Jolinda Norene HERO, DO.  [name] was referred to the pharmacist for assistance related to: DMII  Successful contact was made with the patient to discuss pharmacy services including being ready for the pharmacist to call at least 5 minutes before the scheduled appointment time and to have medication bottles and any blood pressure readings ready for review. The patient agreed to meet with the pharmacist via telephone visit on (date/time).04/19/2024  Jeoffrey Buffalo , RMA     [place]  [facility] Guide  Direct Dial: [phone]  Website: [email or web address]

## 2024-04-19 ENCOUNTER — Telehealth: Payer: Self-pay | Admitting: Pharmacist

## 2024-04-19 ENCOUNTER — Other Ambulatory Visit (INDEPENDENT_AMBULATORY_CARE_PROVIDER_SITE_OTHER)

## 2024-04-19 DIAGNOSIS — N1832 Chronic kidney disease, stage 3b: Secondary | ICD-10-CM

## 2024-04-19 MED ORDER — DAPAGLIFLOZIN PROPANEDIOL 10 MG PO TABS: 10.0000 mg | ORAL_TABLET | Freq: Every day | ORAL | 3 refills | Status: AC

## 2024-04-19 NOTE — Progress Notes (Signed)
 04/19/2024 Name: Stacey Summers MRN: [id number] DOB: Dec 23, 1945  Chief Complaint  Patient presents with   Chronic Kidney Disease   Medication Assistance    IVIS NICOLSON is a 78 y.o. year old female who presented for a telephone visit.   They were referred to the pharmacist by their PCP for assistance in managing medication access and CKD.    Subjective:  Care Team: Primary Care Provider: Jolinda Norene HERO, DO    Medication Access/Adherence  Current Pharmacy:  [facility] [address], [place] - [address] [place] [address] Phone: [phone] Fax: [phone]  [facility] [phone] - [place], [place] - [address] [place] [address] [place] [address] [place] [address] Phone: [phone] Fax: [phone]  [id number] [place] [place] - [address] [place] [address] Phone: [phone] Fax: [phone]  [facility] - [place], [place] - [address] [place] [address] Phone: [phone] Fax: [phone]  [facility] [address] - [place], [place] - [address] [place] [address] Phone: [phone] Fax: [phone]  [facility] - [place], [place] - [address] [place] [address] [place] [address] Phone: [phone] Fax: [phone]   Patient reports affordability concerns with their medications: Yes  Patient reports access/transportation concerns to their pharmacy: No  Patient reports adherence concerns with their medications:  No     Medication Management:  CKD3b and history of T2DM (diet controlled), CHF Great candidate for SGLT2--GFR 42 (has varied) Patient reports high copays with brand name medications Follows with Dr. Rachele (last visit 10/2023)    Objective:  Lab Results  Component Value Date   HGBA1C 4.9 04/01/2024    Lab Results  Component Value Date    CREATININE 1.30 (H) 03/12/2024   BUN 22 03/12/2024   NA 137 03/12/2024   K 4.6 03/12/2024   CL 98 03/12/2024   CO2 29 03/12/2024    Lab Results  Component Value Date   CHOL 166 04/01/2024   HDL 59 04/01/2024   LDLCALC 91 04/01/2024   TRIG 85 04/01/2024   CHOLHDL 2.8 04/01/2024    Medications Reviewed Today     Reviewed by Billee Mliss BIRCH, [facility] (Pharmacist) on 04/19/24 at 1508  Med List Status: <None>   Medication Order Taking? Sig Documenting Provider Last Dose Status Informant  albuterol  (VENTOLIN  HFA) 108 (90 Base) MCG/ACT inhaler [id number]  Inhale 2 puffs into the lungs every 6 (six) hours as needed for wheezing or shortness of breath. Pearlean Manus, MD  Active   ALPRAZolam  (XANAX ) 0.25 MG tablet [id number]  Take 1/2-1 tablet once daily as needed for severe anxiety/ panic attack. May cause hallucinations/ falls Jolinda Norene M, DO  Active   ascorbic acid (VITAMIN C) 500 MG tablet [id number]  Take 500 mg by mouth daily. [provider]  Active Self, Family Member  Biotin 5000 MCG SUBL [id number]  Place under the tongue. [provider]  Active Self, Family Member  Blood Glucose Monitoring Suppl (ONETOUCH VERIO FLEX SYSTEM) w/Device KIT [id number]  Check Bs up to 4 times daily Dx E11.9, Jolinda Norene M, DO  Active   Cholecalciferol (VITAMIN D3) 5000 units CAPS [id number]  Take 5,000 Units by mouth daily. [provider]  Active Self, Family Member  cyclobenzaprine  (FLEXERIL ) 5 MG tablet [id number]  TAKE 1 TABLET BY MOUTH THREE TIMES DAILY AS NEEDED FOR  MUSCLE SPASM . APPOINTMENT REQUIRED FOR FUTURE REFILLS Jolinda Potter M, DO  Active   dapagliflozin  propanediol (FARXIGA ) 10 MG TABS tablet [id number]  Take 1 tablet (10 mg total) by mouth daily. Jolinda Potter M, DO  Active            Med Note TENA, Sian Rockers D   Fri Apr 19, 2024  3:07 PM) Via AZ&me patient assistance program   diltiazem  (CARDIZEM  CD) 120 MG 24 hr capsule [id number]  Take 1 capsule  (120 mg total) by mouth every Monday, Wednesday, and Friday. Pearlean Manus, MD  Active   fluticasone  (FLONASE ) 50 MCG/ACT nasal spray [id number]  Place 2 sprays into both nostrils daily. Jolinda Potter M, DO  Active   furosemide  (LASIX ) 40 MG tablet [id number]  Take 1 tablet (40 mg total) by mouth 2 (two) times daily. Pearlean Manus, MD  Active   glucose blood [facility] VERIO) test strip [id number]  Check Bs up to 4 times daily Dx E11.9 Jolinda Potter M, DO  Active   hydrocortisone  cream 1 % [id number]  Apply 1 Application topically 2 (two) times daily. Apply a sparingly (thin film) to facial rash twice daily for up to a week Pearlean Manus, MD  Active   lactulose  (CHRONULAC ) 10 GM/15ML solution [id number]  TAKE 15 ML BY MOUTH  THREE TIMES DAILY AS NEEDED Nandigam, Kavitha V, MD  Active   levothyroxine  (SYNTHROID ) 25 MCG tablet [id number]  Take 1 tablet (25 mcg total) by mouth daily. Jolinda Potter M, DO  Active   lidocaine  (LIDODERM ) 5 % [id number]  Apply 1 patch to area of pain daily as needed. Remove & Discard patch within 12 hours or as directed by MD Jolinda Potter HERO, DO  Active   meclizine  (ANTIVERT ) 25 MG tablet [id number]  Take 1 tablet (25 mg total) by mouth daily as needed for dizziness. Jolinda Potter HERO, DO  Active   Misc Natural Products (BEET ROOT PO) [id number]  Take 1 Capful by mouth daily. [provider]  Active Self, Family Member  ondansetron  (ZOFRAN -ODT) 4 MG disintegrating tablet [id number]  DISSOLVE 1 TABLET IN MOUTH EVERY 6 HOURS AS NEEDED FOR NAUSEA AND VOMITING Jolinda Potter HERO, DO  Active Self, Family Member  [facility] LANCETS [address] [place] [id number]  USE ONE LANCET TO CHECK GLUCOSE ONCE DAILY AND AS NEEDED Gladis, Mary-Margaret, FNP  Active Self, Family Member  spironolactone  (ALDACTONE ) 25 MG tablet [id number]  Take 1 tablet by mouth once daily Croitoru, Mihai, MD  Active   spironolactone  (ALDACTONE ) 50 MG tablet [id number]  Take 0.5 tablets (25 mg  total) by mouth daily. Pearlean Manus, MD  Active   traMADol  (ULTRAM ) 50 MG tablet [id number]  Take 1 tablet (50 mg total) by mouth daily as needed. Jolinda Potter M, DO  Active   XIFAXAN  550 MG TABS tablet [id number]  TAKE ONE TABLET BY MOUTH TWICE A DAY Nandigam, Kavitha V, MD  Active             Assessment/Plan:   CKD3b/CHF/Diabetes: - Currently controlled; goal A1c <7%. - GFR 42  - Start Farxiga  10mg  daily - Will seek approval for AZ&me PAP for Farxiga  - Discussed potential side effects of dehydration, genitourinary infections., Advised on sick day rules (if a day with significantly reduced oral intake, serious vomiting, or diarrhea, hold SGLT2), Encouraged adequate hydration and genital hygiene.  -encouraged continued f/u with nephrologist  Follow Up Plan: 3 weeks   Mliss Tarry Griffin, PharmD, BCACP, CPP Clinical Pharmacist, [facility]

## 2024-04-19 NOTE — Telephone Encounter (Signed)
 New PAP for farxiga  10mg  daily Please route to me Thank you!

## 2024-04-23 ENCOUNTER — Other Ambulatory Visit

## 2024-04-23 DIAGNOSIS — R809 Proteinuria, unspecified: Secondary | ICD-10-CM | POA: Diagnosis not present

## 2024-04-23 DIAGNOSIS — E211 Secondary hyperparathyroidism, not elsewhere classified: Secondary | ICD-10-CM | POA: Diagnosis not present

## 2024-04-23 DIAGNOSIS — N189 Chronic kidney disease, unspecified: Secondary | ICD-10-CM | POA: Diagnosis not present

## 2024-04-23 DIAGNOSIS — D631 Anemia in chronic kidney disease: Secondary | ICD-10-CM | POA: Diagnosis not present

## 2024-04-24 ENCOUNTER — Telehealth: Payer: Self-pay

## 2024-04-24 NOTE — Progress Notes (Unsigned)
 Pharmacy Medication Assistance Program Note    05/21/2024  Patient ID: Stacey Summers, female   DOB: 1946-07-11, 78 y.o.   MRN: [id number]     04/24/2024  Outreach [employer]ication [employer]  Manufacturer Medication [employer]  [employer] [facility] Farxiga   Dose of Farxiga  10MG   Type of Sport and exercise psychologist  Date Application Sent to Prescriber 04/24/2024  Date Application Received From Provider 05/16/2024  Date Application Submitted to Manufacturer 05/21/2024  Method Application Sent to Manufacturer Fax     New - SUBMITTED

## 2024-04-29 DIAGNOSIS — D6959 Other secondary thrombocytopenia: Secondary | ICD-10-CM | POA: Diagnosis not present

## 2024-04-29 DIAGNOSIS — K7460 Unspecified cirrhosis of liver: Secondary | ICD-10-CM | POA: Diagnosis not present

## 2024-04-29 DIAGNOSIS — N1832 Chronic kidney disease, stage 3b: Secondary | ICD-10-CM | POA: Diagnosis not present

## 2024-04-29 DIAGNOSIS — R809 Proteinuria, unspecified: Secondary | ICD-10-CM | POA: Diagnosis not present

## 2024-05-10 ENCOUNTER — Telehealth: Payer: Self-pay | Admitting: Family Medicine

## 2024-05-10 ENCOUNTER — Other Ambulatory Visit (INDEPENDENT_AMBULATORY_CARE_PROVIDER_SITE_OTHER)

## 2024-05-10 DIAGNOSIS — N1832 Chronic kidney disease, stage 3b: Secondary | ICD-10-CM

## 2024-05-10 NOTE — Telephone Encounter (Signed)
 Copied from CRM [id number]. Topic: Clinical - Medication Question >> May 10, 2024 12:54 PM Willma R wrote: Reason for CRM: Patients daughter Olam, calling to see if Mliss can contact her in regards to her mothers prescription for Farxiga . States Mliss was helping them with an assistive program to get the medication. Says contacted the company it is being sent to her from states they have no record of the patient in their system.  Olam can be reached at [phone]

## 2024-05-10 NOTE — Telephone Encounter (Signed)
 F/u az&me PAP Spoke with spouse They called az&me and they have no record of patient Samples given to bridge You may be waiting on me Let me know! Thanks!

## 2024-05-13 NOTE — Progress Notes (Signed)
 05/10/2024 Name: Stacey Summers MRN: [id number] DOB: [birth year]  Chief Complaint  Patient presents with   Diabetes    Stacey Summers is a 78 y.o. year old female who presented for a telephone visit.   They were referred to the pharmacist by their PCP for assistance in managing medication access and CKD.    Subjective:  Patient was started on Farxiga  10mg  daily at last PharmD visit, however she has not received patient assistance in the mail.    Care Team: Primary Care Provider: Jolinda Norene HERO, DO    Medication Access/Adherence  Current Pharmacy:  [facility] [address], [place] - [address] [place] [address] Phone: [phone] Fax: [phone]  [facility] [phone] - [place], [place] - [address] [facility] [place] [address] [place] [address] [place] [address] Phone: [phone] Fax: [phone]  [id number] [place], [place] - [address] [place] [address] Phone: [phone] Fax: [phone]  [facility] - [place], [place] - [address] [place] [address] Phone: [phone] Fax: [phone]  [facility] [address] - [place], [place] - [address] [place] [address] Phone: [phone] Fax: [phone]  [facility] - [place], [place] - [address] [place] [address] [place] [address] Phone: [phone] Fax: [phone]   Patient reports affordability concerns with their medications: Yes  Patient reports access/transportation concerns to their pharmacy: No  Patient reports adherence concerns with their medications:  No     Medication Management:  CKD3b and history of T2DM (diet controlled), CHF Great candidate for SGLT2--GFR 42 (has varied) Patient reports high copays with brand name medications Follows with Dr. Rachele (last visit 10/2023)    Objective:  Lab Results   Component Value Date   HGBA1C 4.9 04/01/2024    Lab Results  Component Value Date   CREATININE 1.30 (H) 03/12/2024   BUN 22 03/12/2024   NA 137 03/12/2024   K 4.6 03/12/2024   CL 98 03/12/2024   CO2 29 03/12/2024    Lab Results  Component Value Date   CHOL 166 04/01/2024   HDL 59 04/01/2024   LDLCALC 91 04/01/2024   TRIG 85 04/01/2024   CHOLHDL 2.8 04/01/2024    Medications Reviewed Today     Reviewed by Billee Mliss BIRCH, [facility] (Pharmacist) on 05/13/24 at 1229  Med List Status: <None>   Medication Order Taking? Sig Documenting Provider Last Dose Status Informant  albuterol  (VENTOLIN  HFA) 108 (90 Base) MCG/ACT inhaler [id number]  Inhale 2 puffs into the lungs every 6 (six) hours as needed for wheezing or shortness of breath. Pearlean Manus, MD  Active   ALPRAZolam  (XANAX ) 0.25 MG tablet [id number]  Take 1/2-1 tablet once daily as needed for severe anxiety/ panic attack. May cause hallucinations/ falls Jolinda Norene M, DO  Active   ascorbic acid (VITAMIN C) 500 MG tablet [id number]  Take 500 mg by mouth daily. [provider]  Active Self, Family Member  Biotin 5000 MCG SUBL [id number]  Place under the tongue. [provider]  Active Self, Family Member  Blood Glucose Monitoring Suppl (ONETOUCH VERIO FLEX SYSTEM) w/Device KIT [id number]  Check Bs up to 4 times daily Dx E11.9, Jolinda Norene M, DO  Active   Cholecalciferol (VITAMIN D3) 5000 units CAPS [id number]  Take 5,000 Units by mouth daily. [provider]  Active Self, Family Member  cyclobenzaprine  (FLEXERIL ) 5 MG tablet [id number]  TAKE 1 TABLET BY MOUTH THREE TIMES DAILY AS NEEDED FOR MUSCLE SPASM . APPOINTMENT REQUIRED FOR FUTURE REFILLS Jolinda Potter M, DO  Active   dapagliflozin  propanediol (FARXIGA ) 10 MG TABS tablet [id number]  Take 1 tablet (10 mg total) by mouth daily. Jolinda Potter M, DO  Active            Med Note TENA, Carmisha Larusso D   Fri Apr 19, 2024  3:07 PM) Via AZ&me patient  assistance program   diltiazem  (CARDIZEM  CD) 120 MG 24 hr capsule [id number]  Take 1 capsule (120 mg total) by mouth every Monday, Wednesday, and Friday. Pearlean Manus, MD  Active   fluticasone  (FLONASE ) 50 MCG/ACT nasal spray [id number]  Place 2 sprays into both nostrils daily. Jolinda Potter M, DO  Active   furosemide  (LASIX ) 40 MG tablet [id number]  Take 1 tablet (40 mg total) by mouth 2 (two) times daily. Pearlean Manus, MD  Active   glucose blood [facility] VERIO) test strip [id number]  Check Bs up to 4 times daily Dx E11.9 Jolinda Potter M, DO  Active   hydrocortisone  cream 1 % [id number]  Apply 1 Application topically 2 (two) times daily. Apply a sparingly (thin film) to facial rash twice daily for up to a week Pearlean Manus, MD  Active   lactulose  (CHRONULAC ) 10 GM/15ML solution [id number]  TAKE 15 ML BY MOUTH  THREE TIMES DAILY AS NEEDED Nandigam, Kavitha V, MD  Active   levothyroxine  (SYNTHROID ) 25 MCG tablet [id number]  Take 1 tablet (25 mcg total) by mouth daily. Jolinda Potter M, DO  Active   lidocaine  (LIDODERM ) 5 % [id number]  Apply 1 patch to area of pain daily as needed. Remove & Discard patch within 12 hours or as directed by MD Jolinda Potter HERO, DO  Active   meclizine  (ANTIVERT ) 25 MG tablet [id number]  Take 1 tablet (25 mg total) by mouth daily as needed for dizziness. Jolinda Potter HERO, DO  Active   Misc Natural Products (BEET ROOT PO) [id number]  Take 1 Capful by mouth daily. [provider]  Active Self, Family Member  ondansetron  (ZOFRAN -ODT) 4 MG disintegrating tablet [id number]  DISSOLVE 1 TABLET IN MOUTH EVERY 6 HOURS AS NEEDED FOR NAUSEA AND VOMITING Jolinda Potter HERO, DO  Active Self, Family Member  [facility] LANCETS 33G [place] [id number]  USE ONE LANCET TO CHECK GLUCOSE ONCE DAILY AND AS NEEDED Gladis, Mary-Margaret, FNP  Active Self, Family Member  spironolactone  (ALDACTONE ) 25 MG tablet [id number]  Take 1 tablet by mouth once daily Croitoru, Mihai,  MD  Active   spironolactone  (ALDACTONE ) 50 MG tablet [id number]  Take 0.5 tablets (25 mg total) by mouth daily. Pearlean Manus, MD  Active   traMADol  (ULTRAM ) 50 MG tablet [id number]  Take 1 tablet (50 mg total) by mouth daily as needed. Jolinda Potter M, DO  Active   XIFAXAN  550 MG TABS tablet [id number]  TAKE ONE TABLET BY MOUTH TWICE A DAY Nandigam, Kavitha V, MD  Active             Assessment/Plan:   CKD3b/CHF/Diabetes: - Currently controlled; goal A1c <7%. - GFR 42  - Start Farxiga  10mg  daily (patient has not received in the mail yet, will leave samples up front for patient to pick up and start) - Will seek approval for AZ&me PAP for Farxiga  - Discussed potential side effects of dehydration, genitourinary infections., Advised on sick day rules (if a day with significantly reduced oral  intake, serious vomiting, or diarrhea, hold SGLT2), Encouraged adequate hydration and genital hygiene.  -encouraged continued f/u with nephrologist  Follow Up Plan: 3 weeks   Mliss Tarry Griffin, PharmD, BCACP, CPP Clinical Pharmacist, [facility]

## 2024-05-14 ENCOUNTER — Inpatient Hospital Stay: Attending: Hematology

## 2024-05-14 VITALS — BP 153/68 | HR 74 | Temp 97.6°F | Resp 20

## 2024-05-14 DIAGNOSIS — T451X5A Adverse effect of antineoplastic and immunosuppressive drugs, initial encounter: Secondary | ICD-10-CM | POA: Diagnosis not present

## 2024-05-14 DIAGNOSIS — G620 Drug-induced polyneuropathy: Secondary | ICD-10-CM | POA: Diagnosis not present

## 2024-05-14 DIAGNOSIS — E538 Deficiency of other specified B group vitamins: Secondary | ICD-10-CM

## 2024-05-14 MED ORDER — CYANOCOBALAMIN 1000 MCG/ML IJ SOLN
1000.0000 ug | Freq: Once | INTRAMUSCULAR | Status: AC
  Administered 2024-05-14: 1000 ug via INTRAMUSCULAR
  Filled 2024-05-14: qty 1

## 2024-05-14 NOTE — Progress Notes (Signed)
 Patient B12 tolerated injection with no complaints voiced.  Site clean and dry with no bruising or swelling noted at site.  See MAR for details.  Band aid applied.  Patient stable during and after injection.  Vss with discharge and left in satisfactory condition with no s/s of distress noted. All follow ups as scheduled.   Stacey Summers

## 2024-05-16 ENCOUNTER — Telehealth: Payer: Self-pay | Admitting: Pharmacist

## 2024-05-16 NOTE — Telephone Encounter (Signed)
 Assisted in completion of az&me patient assistance application (farxiga ) for patient.  Patient and provider signatures captured, reviewed and faxed back to CPhT.  Medication list reviewed and FYI tab updated.  Bray Vickerman Dattero Idriss Quackenbush, PharmD, BCACP, CPP Clinical Pharmacist, [facility]

## 2024-05-17 ENCOUNTER — Telehealth: Payer: Self-pay | Admitting: Family Medicine

## 2024-05-17 NOTE — Telephone Encounter (Signed)
 Left detailed message for patients daughter, Olam. Per chart notes from yesterday, looks like application for patient assistance for Farxiga  was completed. She can contact [facility] at [phone] to check on the status of patients order.

## 2024-05-17 NOTE — Telephone Encounter (Signed)
 Copied from CRM [id number]. Topic: Clinical - Prescription Issue >> May 17, 2024  3:07 PM Willma R wrote: Reason for CRM: Patients daughter Olam calling in regards to prescription for dapagliflozin  propanediol (FARXIGA ) 10 MG TABS tablet. States she is supposed to get to through the mail but has not been able. Olam states Dr Jolinda asked her to call and send a message to have her call the pharmacy.  [facility] - [place], [place] - [address] [place] [address] Phone: [phone] Fax: [phone]  Olam can be reached at [phone]

## 2024-05-27 ENCOUNTER — Ambulatory Visit
Admission: RE | Admit: 2024-05-27 | Discharge: 2024-05-27 | Disposition: A | Discharge status: Home / Self Care | Source: Ambulatory Visit | Attending: Family Medicine | Admitting: Family Medicine

## 2024-05-27 DIAGNOSIS — Z1231 Encounter for screening mammogram for malignant neoplasm of breast: Secondary | ICD-10-CM

## 2024-05-27 DIAGNOSIS — Z0000 Encounter for general adult medical examination without abnormal findings: Secondary | ICD-10-CM

## 2024-05-28 ENCOUNTER — Telehealth: Payer: Self-pay

## 2024-05-28 NOTE — Telephone Encounter (Signed)
 Called and spoke with patients daughter, Olam, and made her aware that I pulled 2 boxes of Farxiga  10mg  samples for her to pick up for patient. I will leave up front for her to pick up. She voiced understanding.

## 2024-05-28 NOTE — Telephone Encounter (Signed)
 Copied from CRM [id number]. Topic: General - Other >> May 28, 2024 10:54 AM Wess RAMAN wrote: Reason for CRM: Patient's daughter, Olam, would like to let Dr. Jolinda know that patient only has 1 dose of dapagliflozin  propanediol (FARXIGA ) 10 MG TABS tablet left and would like to know if there are more samples available  Callback #: [id number]

## 2024-06-04 NOTE — Telephone Encounter (Signed)
 I know its been awhile since we worked on this. I was wondering if this was ever approved for the patient I don't see a phone note that it was or I maybe missing something. Thanks  You may not keep a record that far back I was trying to clean out some folders and noticed this application.

## 2024-06-05 ENCOUNTER — Other Ambulatory Visit (HOSPITAL_COMMUNITY): Payer: Self-pay

## 2024-06-05 NOTE — Telephone Encounter (Signed)
 Do you guys mind looking into this? I'm not sure the restrictions for patient since she has Medicare. Thanks!

## 2024-06-05 NOTE — Telephone Encounter (Signed)
 Faxed refill/ pg 3 of app

## 2024-06-05 NOTE — Telephone Encounter (Signed)
 There hasn't been anything sent to us  or received for this patient in the time frame.  I ran a test claim and the insurance will pay for it. A 30 day supply has a co-pay of $1,095.87 and she is eligible for the Medicare Prescription Payment Plan Program.  She will need to contact her insurance for further information and to sign up.

## 2024-06-05 NOTE — Telephone Encounter (Signed)
 Well its almost time to redo her patient assistance again for 2026 anyway. I havent heard from the patient about it.

## 2024-06-11 ENCOUNTER — Other Ambulatory Visit (HOSPITAL_COMMUNITY): Payer: Self-pay

## 2024-06-11 NOTE — Telephone Encounter (Signed)
 Did they change their requirements ? She has been getting Xifaxian every year through PAP for probably 5 years.

## 2024-06-11 NOTE — Telephone Encounter (Signed)
 Due to the addition of the Medicare Prescription Payment Plan Program to Medicare this year, it has changed a lot of coverage options for patients with Medicare.

## 2024-06-17 ENCOUNTER — Encounter: Payer: Self-pay | Admitting: Radiology

## 2024-06-17 ENCOUNTER — Telehealth: Payer: Self-pay | Admitting: *Deleted

## 2024-06-17 NOTE — Telephone Encounter (Signed)
-----   Message from [facility] V sent at 02/06/2024  3:42 PM EDT ----- Regarding: 6 mo f/u Patient needs 6 mo f/u   Needs labs and ultrasound prior to appointment   RUQ Abd US   CBC CMET INR/PT AFP

## 2024-06-17 NOTE — Telephone Encounter (Signed)
 Left message for patient to call office.

## 2024-06-26 ENCOUNTER — Other Ambulatory Visit: Payer: Self-pay | Admitting: Gastroenterology

## 2024-06-28 NOTE — Telephone Encounter (Signed)
 error

## 2024-07-09 ENCOUNTER — Inpatient Hospital Stay: Attending: Hematology

## 2024-07-09 VITALS — BP 169/72 | HR 64 | Resp 16

## 2024-07-09 DIAGNOSIS — E538 Deficiency of other specified B group vitamins: Secondary | ICD-10-CM

## 2024-07-09 MED ORDER — CYANOCOBALAMIN 1000 MCG/ML IJ SOLN
1000.0000 ug | Freq: Once | INTRAMUSCULAR | Status: AC
  Administered 2024-07-09: 1000 ug via INTRAMUSCULAR
  Filled 2024-07-09: qty 1

## 2024-07-09 NOTE — Patient Instructions (Signed)
 [facility] [place] - A DEPT OF [facility]  Discharge Instructions: Thank you for choosing [facility] to provide your oncology and hematology care.  If you have a lab appointment with the Cancer Center - please note that after April 8th, 2024, all labs will be drawn in the cancer center.  You do not have to check in or register with the main entrance as you have in the past but will complete your check-in in the cancer center.  Wear comfortable clothing and clothing appropriate for easy access to any Portacath or PICC line.   We strive to give you quality time with your provider. You may need to reschedule your appointment if you arrive late (15 or more minutes).  Arriving late affects you and other patients whose appointments are after yours.  Also, if you miss three or more appointments without notifying the office, you may be dismissed from the clinic at the provider's discretion.      For prescription refill requests, have your pharmacy contact our office and allow 72 hours for refills to be completed.    Today you received the following B12, return as scheduled.   To help prevent nausea and vomiting after your treatment, we encourage you to take your nausea medication as directed.  BELOW ARE SYMPTOMS THAT SHOULD BE REPORTED IMMEDIATELY: *FEVER GREATER THAN 100.4 F (38 C) OR HIGHER *CHILLS OR SWEATING *NAUSEA AND VOMITING THAT IS NOT CONTROLLED WITH YOUR NAUSEA MEDICATION *UNUSUAL SHORTNESS OF BREATH *UNUSUAL BRUISING OR BLEEDING *URINARY PROBLEMS (pain or burning when urinating, or frequent urination) *BOWEL PROBLEMS (unusual diarrhea, constipation, pain near the anus) TENDERNESS IN MOUTH AND THROAT WITH OR WITHOUT PRESENCE OF ULCERS (sore throat, sores in mouth, or a toothache) UNUSUAL RASH, SWELLING OR PAIN  UNUSUAL VAGINAL DISCHARGE OR ITCHING   Items with * indicate a potential emergency and should be followed up as soon as possible or go  to the Emergency Department if any problems should occur.  Please show the CHEMOTHERAPY ALERT CARD or IMMUNOTHERAPY ALERT CARD at check-in to the Emergency Department and triage nurse.  Should you have questions after your visit or need to cancel or reschedule your appointment, please contact [facility] [place] - A DEPT OF [facility]ideg[facility] [phone]  and follow the prompts.  Office hours are 8:00 a.m. to 4:30 p.m. Monday - Friday. Please note that voicemails left after 4:00 p.m. may not be returned until the following business day.  We are closed weekends and major holidays. You have access to a nurse at all times for urgent questions. Please call the main number to the clinic [phone] and follow the prompts.  For any non-urgent questions, you may also contact your provider using MyChart. We now offer e-Visits for anyone 8 and older to request care online for non-urgent symptoms. For details visit [email or web address].   Also download the MyChart app! Go to the app store, search "MyChart", open the app, select [place], and log in with your MyChart username and password.

## 2024-07-09 NOTE — Progress Notes (Signed)
 Patient tolerated injection with no complaints voiced. Site clean and dry with no bruising or swelling noted at site. See MAR for details. Band aid applied.  Patient stable during and after injection. VSS with discharge and left in satisfactory condition with no s/s of distress noted.

## 2024-07-17 ENCOUNTER — Other Ambulatory Visit: Payer: Self-pay | Admitting: Cardiovascular Disease

## 2024-07-17 DIAGNOSIS — R600 Localized edema: Secondary | ICD-10-CM

## 2024-07-23 ENCOUNTER — Telehealth: Payer: Self-pay | Admitting: Physician Assistant

## 2024-07-23 NOTE — Telephone Encounter (Signed)
 Inbound call from patients daughter requesting a call back from [place]. She states that its time to renew Xifaxan  and has a questions regarding the paperwork. Please advise.

## 2024-07-24 ENCOUNTER — Other Ambulatory Visit: Payer: Self-pay

## 2024-07-24 MED ORDER — XIFAXAN 550 MG PO TABS: 550.0000 mg | ORAL_TABLET | Freq: Two times a day (BID) | ORAL | 3 refills | Status: AC

## 2024-07-24 NOTE — Telephone Encounter (Signed)
 Spoke with Stacey Summers. She needs a prescription for the Xifaxan  sent to the local pharmacy. This will allow her to get the print out of the cost. This information os a part of the paperwork for patient assistance.

## 2024-07-24 NOTE — Telephone Encounter (Signed)
 Patient is ready to get her u/s scheduled and wanted to know if the lab order had been put in. Her office visit is 08/20/24. If there is a problem, call Olam. Otherwise they will wait for the call from radiology  and come in for labs closer to 08/20/24. Thanks

## 2024-07-25 ENCOUNTER — Other Ambulatory Visit: Payer: Self-pay | Admitting: *Deleted

## 2024-07-25 DIAGNOSIS — K7460 Unspecified cirrhosis of liver: Secondary | ICD-10-CM

## 2024-07-25 NOTE — Telephone Encounter (Signed)
 Order placed and message sent to radiology to call patient's daughter to schedule US .

## 2024-07-29 ENCOUNTER — Other Ambulatory Visit: Payer: Self-pay | Admitting: *Deleted

## 2024-07-29 DIAGNOSIS — R188 Other ascites: Secondary | ICD-10-CM

## 2024-07-29 NOTE — Telephone Encounter (Signed)
 US  scheduled for 08/13/24

## 2024-08-02 ENCOUNTER — Other Ambulatory Visit: Payer: Self-pay | Admitting: Gastroenterology

## 2024-08-12 ENCOUNTER — Encounter: Payer: Self-pay | Admitting: *Deleted

## 2024-08-13 ENCOUNTER — Ambulatory Visit (HOSPITAL_COMMUNITY)
Admission: RE | Admit: 2024-08-13 | Discharge: 2024-08-13 | Disposition: A | Discharge status: Home / Self Care | Source: Ambulatory Visit | Attending: Physician Assistant | Admitting: Physician Assistant

## 2024-08-13 ENCOUNTER — Other Ambulatory Visit

## 2024-08-13 DIAGNOSIS — K7460 Unspecified cirrhosis of liver: Secondary | ICD-10-CM | POA: Insufficient documentation

## 2024-08-13 DIAGNOSIS — R188 Other ascites: Secondary | ICD-10-CM | POA: Insufficient documentation

## 2024-08-13 NOTE — Addendum Note (Signed)
 Addended by: ENEDINA ADELITA SAILOR on: 08/13/2024 10:20 AM   Modules accepted: Orders

## 2024-08-14 LAB — COMPREHENSIVE METABOLIC PANEL WITH GFR
ALT: 23 IU/L (ref 0–32)
AST: 46 IU/L — ABNORMAL HIGH (ref 0–40)
Albumin: 3 g/dL — ABNORMAL LOW (ref 3.8–4.8)
Alkaline Phosphatase: 136 IU/L — ABNORMAL HIGH (ref 49–135)
BUN/Creatinine Ratio: 21 (ref 12–28)
BUN: 27 mg/dL (ref 8–27)
Bilirubin Total: 4.5 mg/dL — ABNORMAL HIGH (ref 0.0–1.2)
CO2: 31 mmol/L — ABNORMAL HIGH (ref 20–29)
Calcium: 9.8 mg/dL (ref 8.7–10.3)
Chloride: 97 mmol/L (ref 96–106)
Creatinine, Ser: 1.3 mg/dL — ABNORMAL HIGH (ref 0.57–1.00)
Globulin, Total: 2.6 g/dL (ref 1.5–4.5)
Glucose: 143 mg/dL — ABNORMAL HIGH (ref 70–99)
Potassium: 4.2 mmol/L (ref 3.5–5.2)
Sodium: 142 mmol/L (ref 134–144)
Total Protein: 5.6 g/dL — ABNORMAL LOW (ref 6.0–8.5)
eGFR: 43 mL/min/1.73 — ABNORMAL LOW

## 2024-08-14 LAB — CBC WITH DIFFERENTIAL/PLATELET
Basophils Absolute: 0 x10E3/uL (ref 0.0–0.2)
Basos: 1 %
EOS (ABSOLUTE): 0.2 x10E3/uL (ref 0.0–0.4)
Eos: 2 %
Hematocrit: 41.4 % (ref 34.0–46.6)
Hemoglobin: 14.4 g/dL (ref 11.1–15.9)
Immature Grans (Abs): 0 x10E3/uL (ref 0.0–0.1)
Immature Granulocytes: 0 %
Lymphocytes Absolute: 1.9 x10E3/uL (ref 0.7–3.1)
Lymphs: 29 %
MCH: 33.9 pg — ABNORMAL HIGH (ref 26.6–33.0)
MCHC: 34.8 g/dL (ref 31.5–35.7)
MCV: 97 fL (ref 79–97)
Monocytes Absolute: 0.7 x10E3/uL (ref 0.1–0.9)
Monocytes: 10 %
Neutrophils Absolute: 3.7 x10E3/uL (ref 1.4–7.0)
Neutrophils: 58 %
Platelets: 94 x10E3/uL — CL (ref 150–450)
RBC: 4.25 x10E6/uL (ref 3.77–5.28)
RDW: 12.9 % (ref 11.7–15.4)
WBC: 6.5 x10E3/uL (ref 3.4–10.8)

## 2024-08-20 ENCOUNTER — Ambulatory Visit: Payer: Self-pay | Admitting: Physician Assistant

## 2024-08-20 ENCOUNTER — Other Ambulatory Visit (INDEPENDENT_AMBULATORY_CARE_PROVIDER_SITE_OTHER)

## 2024-08-20 ENCOUNTER — Ambulatory Visit: Admitting: Physician Assistant

## 2024-08-20 ENCOUNTER — Encounter: Payer: Self-pay | Admitting: Physician Assistant

## 2024-08-20 DIAGNOSIS — K7460 Unspecified cirrhosis of liver: Secondary | ICD-10-CM

## 2024-08-20 DIAGNOSIS — K760 Fatty (change of) liver, not elsewhere classified: Secondary | ICD-10-CM | POA: Diagnosis not present

## 2024-08-20 DIAGNOSIS — K7290 Hepatic failure, unspecified without coma: Secondary | ICD-10-CM

## 2024-08-20 DIAGNOSIS — Z8719 Personal history of other diseases of the digestive system: Secondary | ICD-10-CM

## 2024-08-20 LAB — CBC WITH DIFFERENTIAL/PLATELET
Basophils Absolute: 0.1 K/uL (ref 0.0–0.1)
Basophils Relative: 0.7 % (ref 0.0–3.0)
Eosinophils Absolute: 0.1 K/uL (ref 0.0–0.7)
Eosinophils Relative: 2 % (ref 0.0–5.0)
HCT: 42.5 % (ref 36.0–46.0)
Hemoglobin: 14.5 g/dL (ref 12.0–15.0)
Lymphocytes Relative: 34.2 % (ref 12.0–46.0)
Lymphs Abs: 2.4 K/uL (ref 0.7–4.0)
MCHC: 34.1 g/dL (ref 30.0–36.0)
MCV: 99.1 fl (ref 78.0–100.0)
Monocytes Absolute: 0.7 K/uL (ref 0.1–1.0)
Monocytes Relative: 10.4 % (ref 3.0–12.0)
Neutro Abs: 3.7 K/uL (ref 1.4–7.7)
Neutrophils Relative %: 52.7 % (ref 43.0–77.0)
Platelets: 99 K/uL — ABNORMAL LOW (ref 150.0–400.0)
RBC: 4.29 Mil/uL (ref 3.87–5.11)
RDW: 14.8 % (ref 11.5–15.5)
WBC: 7 K/uL (ref 4.0–10.5)

## 2024-08-20 LAB — COMPREHENSIVE METABOLIC PANEL WITH GFR
ALT: 19 U/L (ref 3–35)
AST: 43 U/L — ABNORMAL HIGH (ref 5–37)
Albumin: 3.1 g/dL — ABNORMAL LOW (ref 3.5–5.2)
Alkaline Phosphatase: 114 U/L (ref 39–117)
BUN: 37 mg/dL — ABNORMAL HIGH (ref 6–23)
CO2: 33 meq/L — ABNORMAL HIGH (ref 19–32)
Calcium: 9.5 mg/dL (ref 8.4–10.5)
Chloride: 100 meq/L (ref 96–112)
Creatinine, Ser: 1.25 mg/dL — ABNORMAL HIGH (ref 0.40–1.20)
GFR: 41.9 mL/min — ABNORMAL LOW
Glucose, Bld: 122 mg/dL — ABNORMAL HIGH (ref 70–99)
Potassium: 3.7 meq/L (ref 3.5–5.1)
Sodium: 140 meq/L (ref 135–145)
Total Bilirubin: 4.2 mg/dL — ABNORMAL HIGH (ref 0.2–1.2)
Total Protein: 6.2 g/dL (ref 6.0–8.3)

## 2024-08-20 LAB — PROTIME-INR
INR: 1.3 ratio — ABNORMAL HIGH (ref 0.8–1.0)
Prothrombin Time: 14 s — ABNORMAL HIGH (ref 9.6–13.1)

## 2024-08-20 NOTE — Progress Notes (Signed)
 "  Chief Complaint: Follow-up cirrhosis  HPI:    Stacey Summers is a 79 year old female with a past medical history as listed below including NASH cirrhosis, COPD and multiple others, known to Dr. Shila, who returns to clinic today for follow-up.      08/07/2018 colonoscopy with four 4-9 mm polyps in the transverse and ascending colon as well as diverticulosis in the sigmoid colon nonbleeding internal hemorrhoids.  Recall discontinued due to age and comorbidities.    08/20/2019 EGD with grade 1 and small less than 5 mm varices and portal hypertensive gastropathy.    06/30/2022 MRI of the abdomen with and without contrast showed nodular cirrhotic morphology of the liver.  Very narrowed appearance of the portal vein.  Status post cholecystectomy and trace perihepatic ascites.    07/01/2023-07/03/2023 patient admitted to the hospital for sepsis.  Also had anasarca.  She was admitted for altered mentation and fevers.  Diagnosed with an upper respiratory infection and started on Cefepime  and Vancomycin .  Blood cultures negative.  Discharged home on Augmentin .  Noted to have chronic diastolic dysfunction CHF.  Echo 07/02/2023 with an EF of 55-60% with mild concentric LVH.  Patient developed AKI with IV Lasix .  Continued on Lactulose  and Rifaximin  at discharge.  Told to use Cardizem  Monday Wednesday Friday.    09/04/2023 patient had a video visit with her PCP for a cough and fever.  PCP notified us  due to some low hemoglobin.  Patient was not describing any GI bleed.  Dr. Shila reviewed and saw no significant change in CBC or CMP.    09/04/2023 CBC stable.  Platelets now at 92.  Ammonia level minimally elevated at 69 from 14 on 07/01/2023.    09/27/2023 office visit with me.  At that time had admission for sepsis and a URI.  At that time having lower extremity swelling with diuretics managed by nephrologist.  Had occasional left upper quadrant abdominal pain and lactulose  seems to be working for her.  At that  point updated labs including CBC, CMP, PT/INR and AFP, ordered right upper quadrant ultrasound for [facility] surveillance.  We are working on Xifaxan .  Continued on Lactulose  3 times daily and continued on Aldactone  at her discretion from nephrology.  MELD calculated 18.    10/06/2023 right upper quadrant ultrasound with coarsened nodular hepatic echotexture suggestive of cirrhotic changes, mild intrahepatic biliary dilation.  Absent gallbladder.  They recommended MRCP given dilated CBD.    10/13/2023 MRCP was limited due to breath artifact.  CBD unchanged with a dilation 1.5 cm in caliber.  Cirrhosis.  Sequela of portal hypertension including mild splenomegaly, large splenic varices left upper quadrant and trace.  Hepatic ascites.    01/12/24 CMP with a creatinine of 1.32, at patient's baseline, albumin 3.2, alk phos 146, total bili 2.9, AST 46.    02/02/2024 patient seen in office by me for follow-up of her decompensated cirrhosis.  At that time had some excessive swelling in her legs.  Diuretics were managed by her kidney doctor and she had not seen them recently.  At that time on Spironolactone  25 mg daily.  Also Xifaxan  550 twice daily and Lactulose  15 mL 3 times daily.  At that point recommended an ultrasound in 4 to 6 months for [facility] screening, repeated labs including a CBC, CMP, PT/INR and AFP prior to her next visit in 4 to 6 months.  Continue on lactulose  3 times daily.  Continue Aldactone  and discretion from nephrology.  Recommend they see nephrology  first before titrating this.  Continue on Xifaxan  550 twice daily.    08/13/2024 right upper quadrant ultrasound with cirrhotic changes of the liver with reversed hepatofugal flow in the portal vein, remote cholecystectomy.    08/13/2024 CMP with a glucose of 143, creatinine 1.3, albumin 3, total bili 4.5, alk phos 136, AST 46 and ALT 23.  Sodium normal at 142, potassium 4.2.  CBC with platelets low at 94 (around her baseline) and otherwise normal.  INR says  collected but not resulted.  AFP shows future.    Today, patient presents to clinic accompanied by her daughter for follow-up of her decompensated cirrhosis.  She tells me she had labs done in [facility], but unfortunately the INR has not resulted yet and the AFP was unable to be done.  In general she feels okay, nephrology continues to monitor her diuretics going forward and the current dose she is on of Lasix  40 mg twice daily tends to work well for her unless she rides in the car a long way.  Otherwise feeling well, continues on her GI meds, do need a refill of Xifaxan .  No new complaints or concerns.    Patient is very worried that she has had progression of her cirrhosis, asked me about liver transplant, apparently they followed with [facility] a couple of years ago.    She has 15 grandchildren.    Denies fever, chills, weight loss, shortness of breath or abdominal pain.  Past Medical History:  Diagnosis Date   Allergy    seasonal   Anxiety    Arthritis    hands, back    B12 deficiency 03/31/2021   Cataract    surgery to remove   Chronic back pain    lower back    Cirrhosis of liver without ascites (HCC)    recent dx 07/11/18   Closed fracture of right tibial plateau 01/31/2018   Complex tear of medial meniscus of right knee 03/27/2018   COPD (chronic obstructive pulmonary disease) (HCC)    no inhaler   Diabetes mellitus without complication (HCC)    DJD (degenerative joint disease)    Fibromyalgia    Genetic testing 03/08/2017   [name] underwent genetic counseling and testing for hereditary cancer syndromes on 02/21/2017. Her results were negative for mutations in all 83 genes analyzed by [facility]'s 83-gene Multi-Cancers Panel. Genes analyzed include: ALK, APC, ATM, AXIN2, BAP1, BARD1, BLM, BMPR1A, BRCA1, BRCA2, BRIP1, CASR, CDC73, CDH1, CDK4, CDKN1B, CDKN1C, CDKN2A, CEBPA, CHEK2, CTNNA1, DICER1, DIS3L2, EGFR, EPCAM   GERD (gastroesophageal reflux disease)    Hearing loss     bilateral - no hearing aids   History of abnormal mammogram    History of arthroplasty of right knee 07/06/2018   Hyperlipidemia    Hypertension    Irregular heart beat    Neuromuscular disorder (HCC)    fibromyalgia   Sleep apnea    cpap some   Stress fracture of right tibia 03/27/2018   SVD (spontaneous vaginal delivery)    x 2   Tubular adenoma of colon 05/09/12   innumerable number of flat polyps    Past Surgical History:  Procedure Laterality Date   CARPAL TUNNEL RELEASE Left 10/20/14   [facility]   CATARACT EXTRACTION, BILATERAL     CESAREAN SECTION  1977   CHOLECYSTECTOMY  1990   COLONOSCOPY     IR RADIOLOGIST EVAL & MGMT  07/16/2019   KNEE ARTHROSCOPY WITH SUBCHONDROPLASTY Right 03/27/2018   Procedure: Right knee  arthroscopic partial medial meniscus repair versus menisectomy with medial tibia subchondroplasty;  Surgeon: Sharl Selinda Dover, MD;  Location: [facility] OR;  Service: Orthopedics;  Laterality: Right;  120 mins   LARYNX SURGERY     POLYP REMOVAL   MULTIPLE TOOTH EXTRACTIONS     POLYPECTOMY     vocal cord   TONSILLECTOMY  1965   TUBAL LIGATION     TUMOR REMOVAL     benign fatty tumors on abdomen   UPPER GASTROINTESTINAL ENDOSCOPY      Current Outpatient Medications  Medication Sig Dispense Refill   albuterol  (VENTOLIN  HFA) 108 (90 Base) MCG/ACT inhaler Inhale 2 puffs into the lungs every 6 (six) hours as needed for wheezing or shortness of breath. 18 g 1   ALPRAZolam  (XANAX ) 0.25 MG tablet Take 1/2-1 tablet once daily as needed for severe anxiety/ panic attack. May cause hallucinations/ falls 30 tablet 0   ascorbic acid (VITAMIN C) 500 MG tablet Take 500 mg by mouth daily.     Biotin 5000 MCG SUBL Place under the tongue.     Blood Glucose Monitoring Suppl (ONETOUCH VERIO FLEX SYSTEM) w/Device KIT Check Bs up to 4 times daily Dx E11.9, 1 kit 0   Cholecalciferol (VITAMIN D3) 5000 units CAPS Take 5,000 Units by mouth daily.     cyclobenzaprine  (FLEXERIL ) 5 MG  tablet TAKE 1 TABLET BY MOUTH THREE TIMES DAILY AS NEEDED FOR MUSCLE SPASM . APPOINTMENT REQUIRED FOR FUTURE REFILLS 30 tablet 0   dapagliflozin  propanediol (FARXIGA ) 10 MG TABS tablet Take 1 tablet (10 mg total) by mouth daily. 90 tablet 3   diltiazem  (CARDIZEM  CD) 120 MG 24 hr capsule Take 1 capsule (120 mg total) by mouth every Monday, Wednesday, and Friday. 45 capsule 2   fluticasone  (FLONASE ) 50 MCG/ACT nasal spray Place 2 sprays into both nostrils daily. 16 g 12   furosemide  (LASIX ) 40 MG tablet Take 1 tablet (40 mg total) by mouth 2 (two) times daily. Pt must schedule an overdue followup appt with Cardiology for any more refills. [phone] 1st attempt Thank You 60 tablet 0   glucose blood (ONETOUCH VERIO) test strip Check Bs up to 4 times daily Dx E11.9 400 each 3   hydrocortisone  cream 1 % Apply 1 Application topically 2 (two) times daily. Apply a sparingly (thin film) to facial rash twice daily for up to a week 30 g 0   lactulose  (CHRONULAC ) 10 GM/15ML solution TAKE 15 ML BY MOUTH  THREE TIMES DAILY AS NEEDED 948 mL 0   levothyroxine  (SYNTHROID ) 25 MCG tablet Take 1 tablet (25 mcg total) by mouth daily. 90 tablet 3   lidocaine  (LIDODERM ) 5 % Apply 1 patch to area of pain daily as needed. Remove & Discard patch within 12 hours or as directed by MD 90 patch 3   meclizine  (ANTIVERT ) 25 MG tablet Take 1 tablet (25 mg total) by mouth daily as needed for dizziness. 90 tablet 0   Misc Natural Products (BEET ROOT PO) Take 1 Capful by mouth daily.     ondansetron  (ZOFRAN -ODT) 4 MG disintegrating tablet DISSOLVE 1 TABLET IN MOUTH EVERY 6 HOURS AS NEEDED FOR NAUSEA AND VOMITING 20 tablet 0   ONETOUCH DELICA LANCETS 33G MISC USE ONE LANCET TO CHECK GLUCOSE ONCE DAILY AND AS NEEDED 100 each 0   spironolactone  (ALDACTONE ) 25 MG tablet Take 1 tablet by mouth once daily 90 tablet 3   spironolactone  (ALDACTONE ) 50 MG tablet Take 0.5 tablets (25 mg total) by mouth daily.  45 tablet 1   traMADol  (ULTRAM ) 50  MG tablet Take 1 tablet (50 mg total) by mouth daily as needed. 30 tablet 5   XIFAXAN  550 MG TABS tablet Take 1 tablet (550 mg total) by mouth 2 (two) times daily. 180 tablet 3   No current facility-administered medications for this visit.    Allergies as of 08/20/2024 - Review Complete 07/09/2024  Allergen Reaction Noted   Latex Other (See Comments) 03/27/2018    Family History  Problem Relation Age of Onset   Heart failure Mother        d.37   Heart disease Mother    Prostate cancer Father        d.89 diagnosed in late-60s   Colon cancer Father        diagnosed at 51   Leukemia Maternal Aunt    Heart failure Maternal Uncle        d.50   Stomach cancer Paternal Aunt        questionable   Colon cancer Paternal Uncle        in his 54s   Heart failure Maternal Grandmother        d.45   Kidney cancer Cousin        d.78s maternal first-cousin   Colon polyps Brother        polyposis. Underwent partial colectomy at age 71.   Spina bifida Brother        died at birth   Colon polyps Son 25   Heart attack Son 56   Other Other 16       multiple lipomas   Esophageal cancer Neg Hx    Rectal cancer Neg Hx    Breast cancer Neg Hx     Social History   Socioeconomic History   Marital status: Married    Spouse name: Lamar goes by [employer]   Number of children: 3   Years of education: 11   Highest education level: GED or equivalent  Occupational History   Occupation: retired  Tobacco Use   Smoking status: Former    Current packs/day: 0.00    Average packs/day: 1 pack/day for 3.0 years (3.0 ttl pk-yrs)    Types: Cigarettes    Start date: 08/15/1966    Quit date: 08/15/1969    Years since quitting: 55.0   Smokeless tobacco: Never  Vaping Use   Vaping status: Never Used  Substance and Sexual Activity   Alcohol use: No    Alcohol/week: 0.0 standard drinks of alcohol   Drug use: No   Sexual activity: Not Currently  Other Topics Concern   Not on file  Social History  Narrative   Daughter lives with them and helps a lot   Social Drivers of Health   Tobacco Use: Medium Risk (07/09/2024)   Patient History    Smoking Tobacco Use: Former    Smokeless Tobacco Use: Never    Passive Exposure: Not on Actuary Strain: Low Risk (04/02/2024)   Overall Financial Resource Strain (CARDIA)    Difficulty of Paying Living Expenses: Not hard at all  Food Insecurity: No Food Insecurity (04/02/2024)   Epic    Worried About Programme Researcher, Broadcasting/film/video in the Last Year: Never true    Ran Out of Food in the Last Year: Never true  Transportation Needs: No Transportation Needs (07/04/2023)   PRAPARE - Administrator, Civil Service (Medical): No    Lack of Transportation (Non-Medical): No  Physical  Activity: Inactive (04/02/2024)   Exercise Vital Sign    Days of Exercise per Week: 0 days    Minutes of Exercise per Session: 0 min  Stress: Stress Concern Present (04/02/2024)   [employer] of Occupational Health - Occupational Stress Questionnaire    Feeling of Stress: To some extent  Social Connections: Moderately Integrated (04/02/2024)   Social Connection and Isolation Panel    Frequency of Communication with Friends and Family: More than three times a week    Frequency of Social Gatherings with Friends and Family: More than three times a week    Attends Religious Services: More than 4 times per year    Active Member of [employer] or Organizations: No    Attends Banker Meetings: Never    Marital Status: Married  Catering Manager Violence: Not At Risk (04/02/2024)   Epic    Fear of Current or Ex-Partner: No    Emotionally Abused: No    Physically Abused: No    Sexually Abused: No  Depression (PHQ2-9): Medium Risk (04/02/2024)   Depression (PHQ2-9)    PHQ-2 Score: 9  Alcohol Screen: Low Risk (04/02/2024)   Alcohol Screen    Last Alcohol Screening Score (AUDIT): 0  Housing: Unknown (04/02/2024)   Epic    Unable to Pay for Housing in  the Last Year: No    Number of Times Moved in the Last Year: Not on file    Homeless in the Last Year: No  Utilities: Not At Risk (04/02/2024)   Epic    Threatened with loss of utilities: No  Health Literacy: Adequate Health Literacy (04/02/2024)   B1300 Health Literacy    Frequency of need for help with medical instructions: Never    Review of Systems:    Constitutional: No weight loss, fever or chills Cardiovascular: No chest pain   Respiratory: No SOB  Gastrointestinal: See HPI and otherwise negative   Physical Exam:  Vital signs: BP 110/72   Pulse (!) 47 Comment: Reports this is normal for her.  Ht 5' 3 (1.6 m)   Wt 230 lb (104.3 kg)   SpO2 96%   BMI 40.74 kg/m    Constitutional:   Pleasant obese Caucasian female appears to be in NAD, Well developed, Well nourished, alert and cooperative Respiratory: Respirations even and unlabored. Lungs clear to auscultation bilaterally.   No wheezes, crackles, or rhonchi.  Cardiovascular: Normal S1, S2. No MRG. Regular rate and rhythm. +1-2 edema B/L Gastrointestinal:  Soft, nondistended, nontender. No rebound or guarding. Normal bowel sounds. No appreciable masses or hepatomegaly. Rectal:  Not performed.  Psychiatric: Oriented to person, place and time. Demonstrates good judgement and reason without abnormal affect or behaviors.  RELEVANT LABS AND IMAGING: CBC    Component Value Date/Time   WBC 6.5 08/13/2024 1026   WBC 5.2 03/12/2024 1338   RBC 4.25 08/13/2024 1026   RBC 3.83 (L) 03/12/2024 1338   HGB 14.4 08/13/2024 1026   HCT 41.4 08/13/2024 1026   PLT 94 (LL) 08/13/2024 1026   MCV 97 08/13/2024 1026   MCH 33.9 (H) 08/13/2024 1026   MCH 34.2 (H) 03/12/2024 1338   MCHC 34.8 08/13/2024 1026   MCHC 32.8 03/12/2024 1338   RDW 12.9 08/13/2024 1026   LYMPHSABS 1.9 08/13/2024 1026   MONOABS 0.5 03/12/2024 1338   EOSABS 0.2 08/13/2024 1026   BASOSABS 0.0 08/13/2024 1026    CMP     Component Value Date/Time   NA 142  08/13/2024 1026  K 4.2 08/13/2024 1026   CL 97 08/13/2024 1026   CO2 31 (H) 08/13/2024 1026   GLUCOSE 143 (H) 08/13/2024 1026   GLUCOSE 181 (H) 03/12/2024 1338   BUN 27 08/13/2024 1026   CREATININE 1.30 (H) 08/13/2024 1026   CREATININE 0.67 12/20/2012 1044   CALCIUM  9.8 08/13/2024 1026   PROT 5.6 (L) 08/13/2024 1026   ALBUMIN 3.0 (L) 08/13/2024 1026   AST 46 (H) 08/13/2024 1026   ALT 23 08/13/2024 1026   ALKPHOS 136 (H) 08/13/2024 1026   BILITOT 4.5 (H) 08/13/2024 1026   GFRNONAA 43 (L) 03/12/2024 1338   GFRNONAA >89 12/20/2012 1044   GFRAA >60 10/04/2019 0820   GFRAA >89 12/20/2012 1044    Assessment: 1.  Decompensated MASLD cirrhosis: With esophageal varices (last EGD 2021, history of hepatic encephalopathy and ascites (diuretics managed by nephrology), now with recent ultrasound showing hepatofugal flow, unfortunately unable to recalculate MELD today as INR never resulted from outside clinic, rechecking labs, in general patient feels stable  Plan: 1.  Continue Xifaxan  550 twice daily, will work on getting this approved through her insurance again 2.  Continue to follow diuretic regimen as outlined by nephrology team 3.  Continue Lactulose  3 times daily, can increase for any signs of confusion, goal is 3-4 bowel movements a day 4.  Reviewed recent right upper quadrant ultrasound for HCC screening.  Repeat recommended in 6 months. 5.  Ordered labs including a CBC, CMP, PT/INR and AFP, that way we can calculate MELD today 6.  Patient to follow in clinic in 6 months or sooner if necessary.  Will also let Dr. Shila review recent imaging for any additional recommendations.  Stacey Failing, Stacey Summers [facility] 08/20/2024, 9:52 AM  Cc: Stacey Norene HERO, DO  "

## 2024-08-20 NOTE — Patient Instructions (Addendum)
 _______________________________________________________  If your blood pressure at your visit was 140/90 or greater, please contact your primary care physician to follow up on this.  _______________________________________________________  If you are age 77 or older, your body mass index should be between 23-30. Your Body mass index is 40.74 kg/m. If this is out of the aforementioned range listed, please consider follow up with your Primary Care Provider.  If you are age 24 or younger, your body mass index should be between 19-25. Your Body mass index is 40.74 kg/m. If this is out of the aformentioned range listed, please consider follow up with your Primary Care Provider.   ________________________________________________________  The [facility] providers would like to encourage you to use MYCHART to communicate with providers for non-urgent requests or questions.  Due to long hold times on the telephone, sending your provider a message by [facility] may be a faster and more efficient way to get a response.  Please allow 48 business hours for a response.  Please remember that this is for non-urgent requests.  _______________________________________________________  [facility] is using a team-based approach to care.  Your team is made up of your doctor and two to three APPS. Our APPS (Nurse Practitioners and Physician Assistants) work with your physician to ensure care continuity for you. They are fully qualified to address your health concerns and develop a treatment plan. They communicate directly with your gastroenterologist to care for you. Seeing the Advanced Practice Practitioners on your physician's team can help you by facilitating care more promptly, often allowing for earlier appointments, access to diagnostic testing, procedures, and other specialty referrals.   Your provider has requested that you go to the basement level for lab work before leaving today. Press B on the  elevator. The lab is located at the first door on the left as you exit the elevator.  Due to recent changes in healthcare laws, you may see the results of your imaging and laboratory studies on MyChart before your provider has had a chance to review them.  We understand that in some cases there may be results that are confusing or concerning to you. Not all laboratory results come back in the same time frame and the provider may be waiting for multiple results in order to interpret others.  Please give us  48 hours in order for your provider to thoroughly review all the results before contacting the office for clarification of your results.   Thank you for entrusting me with your care and choosing [facility].  Delon Failing, PA-C

## 2024-08-21 ENCOUNTER — Ambulatory Visit: Payer: Self-pay | Admitting: Physician Assistant

## 2024-08-22 LAB — AFP TUMOR MARKER: AFP-Tumor Marker: 4.1 ng/mL

## 2024-08-22 NOTE — Progress Notes (Signed)
 Stacey Summers                                          MRN: [id number]   08/22/2024   The [facility] Quality Team Specialist reviewed this patient medical record for the purposes of chart review for care gap closure. The following were reviewed: chart review for care gap closure-controlling blood pressure.    [facility]BCI Quality Team

## 2024-08-28 ENCOUNTER — Other Ambulatory Visit

## 2024-08-29 ENCOUNTER — Ambulatory Visit: Payer: Self-pay

## 2024-08-29 ENCOUNTER — Telehealth: Payer: Self-pay | Admitting: Family Medicine

## 2024-08-29 NOTE — Telephone Encounter (Signed)
 Attempted to contact pt in regards to refill request but NA. LM for her to contact clinic. Attempt #1    Copied from CRM #[id number]. Topic: Clinical - Medication Refill >> Aug 29, 2024  3:20 PM Cherylann S wrote: Medication: ondansetron  (ZOFRAN -ODT) 4 MG disintegrating tablet

## 2024-08-29 NOTE — Telephone Encounter (Signed)
 Copied from CRM [id number]. Topic: Clinical - Medication Refill >> Aug 29, 2024  3:20 PM Cherylann S wrote: Medication: ondansetron  (ZOFRAN -ODT) 4 MG disintegrating tablet  Has the patient contacted their pharmacy? Yes (Agent: If no, request that the patient contact the pharmacy for the refill. If patient does not wish to contact the pharmacy document the reason why and proceed with request.) (Agent: If yes, when and what did the pharmacy advise?)  This is the patient's preferred pharmacy:  [id number] - [place], [place] - [address] [place] [address] Phone: [phone] Fax: [phone]  Is this the correct pharmacy for this prescription? Yes If no, delete pharmacy and type the correct one.   Has the prescription been filled recently? No  Is the patient out of the medication? Yes  Has the patient been seen for an appointment in the last year OR does the patient have an upcoming appointment? Yes  Can we respond through MyChart? Yes  Agent: Please be advised that Rx refills may take up to 3 business days. We ask that you follow-up with your pharmacy.

## 2024-08-30 ENCOUNTER — Other Ambulatory Visit: Payer: Self-pay | Admitting: Gastroenterology

## 2024-08-30 MED ORDER — ONDANSETRON 4 MG PO TBDP: 4.0000 mg | ORAL_TABLET | Freq: Three times a day (TID) | ORAL | 0 refills | Status: AC | PRN

## 2024-08-30 NOTE — Telephone Encounter (Signed)
 Duplicate

## 2024-09-02 ENCOUNTER — Inpatient Hospital Stay: Attending: Hematology

## 2024-09-02 VITALS — BP 159/65 | HR 45 | Temp 97.8°F | Resp 16

## 2024-09-02 DIAGNOSIS — E538 Deficiency of other specified B group vitamins: Secondary | ICD-10-CM

## 2024-09-02 MED ORDER — CYANOCOBALAMIN 1000 MCG/ML IJ SOLN
1000.0000 ug | Freq: Once | INTRAMUSCULAR | Status: AC
  Administered 2024-09-02: 1000 ug via INTRAMUSCULAR
  Filled 2024-09-02: qty 1

## 2024-09-02 NOTE — Patient Instructions (Signed)

## 2024-09-02 NOTE — Progress Notes (Signed)
Stacey Summers presents today for B12 injection per the provider's orders.  Stable during administration without incident; injection site WNL; see MAR for injection details.  Patient tolerated procedure well and without incident.  No questions or complaints noted at this time.

## 2024-09-03 ENCOUNTER — Inpatient Hospital Stay

## 2024-09-12 NOTE — Telephone Encounter (Signed)
 The pt daughter is calling with complaints that her mother has since last office visit.  She has decompensated cirrhosis and has developed left side abd pain when drinking or eating.  She also has significant edema of the lower leg, ankles and feet.  She is taking spironolactone  50 mg daily and lasix  40 mg daily.  The pt reports that her weight is stable.  Lactulose  3 times daily with 3-4 bowel movement daily.  She has not been able to start xifaxan  as of today but a call has been scheduled for Monday to discuss with social security to try and get coverage.  They have a call to nephrology as well to discuss edema.

## 2024-09-19 ENCOUNTER — Other Ambulatory Visit: Payer: Self-pay | Admitting: Gastroenterology

## 2024-09-23 ENCOUNTER — Ambulatory Visit: Admitting: Gastroenterology

## 2024-10-07 ENCOUNTER — Ambulatory Visit: Payer: Self-pay | Admitting: Family Medicine

## 2024-10-29 ENCOUNTER — Inpatient Hospital Stay: Attending: Hematology

## 2024-11-12 ENCOUNTER — Ambulatory Visit: Admitting: Dermatology

## 2024-12-24 ENCOUNTER — Inpatient Hospital Stay: Attending: Hematology

## 2025-02-18 ENCOUNTER — Inpatient Hospital Stay: Attending: Hematology

## 2025-04-03 ENCOUNTER — Ambulatory Visit: Payer: Self-pay

## 2025-04-04 ENCOUNTER — Encounter: Payer: Self-pay | Admitting: Family Medicine

## 2025-04-08 ENCOUNTER — Other Ambulatory Visit

## 2025-04-15 ENCOUNTER — Inpatient Hospital Stay

## 2025-04-15 ENCOUNTER — Inpatient Hospital Stay: Attending: Hematology | Admitting: Physician Assistant

## 2025-04-15 ENCOUNTER — Ambulatory Visit
